# Patient Record
Sex: Female | Born: 1945 | Race: White | Hispanic: No | Marital: Married | Smoking: Never smoker
Health system: Southern US, Community
[De-identification: ages and names within clinical notes are randomized; demographics above are authoritative.]

## PROBLEM LIST (undated history)

## (undated) ENCOUNTER — Inpatient Hospital Stay: Admission: EM | Payer: Self-pay | Source: Home / Self Care

## (undated) DIAGNOSIS — J45909 Unspecified asthma, uncomplicated: Secondary | ICD-10-CM

## (undated) DIAGNOSIS — E785 Hyperlipidemia, unspecified: Secondary | ICD-10-CM

## (undated) DIAGNOSIS — K219 Gastro-esophageal reflux disease without esophagitis: Secondary | ICD-10-CM

## (undated) DIAGNOSIS — I10 Essential (primary) hypertension: Secondary | ICD-10-CM

## (undated) DIAGNOSIS — E079 Disorder of thyroid, unspecified: Secondary | ICD-10-CM

## (undated) DIAGNOSIS — H269 Unspecified cataract: Secondary | ICD-10-CM

## (undated) HISTORY — DX: Gastro-esophageal reflux disease without esophagitis: K21.9

## (undated) HISTORY — DX: Unspecified asthma, uncomplicated: J45.909

## (undated) HISTORY — DX: Unspecified cataract: H26.9

## (undated) HISTORY — PX: CORONARY ANGIOPLASTY WITH STENT PLACEMENT: SHX49

## (undated) HISTORY — DX: Essential (primary) hypertension: I10

## (undated) HISTORY — DX: Hyperlipidemia, unspecified: E78.5

## (undated) HISTORY — DX: Disorder of thyroid, unspecified: E07.9

## (undated) HISTORY — PX: OTHER SURGICAL HISTORY: SHX169

## (undated) HISTORY — PX: EYE SURGERY: SHX253

---
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
## 2005-03-29 ENCOUNTER — Emergency Department (HOSPITAL_COMMUNITY): Admission: EM | Admit: 2005-03-29 | Discharge: 2005-03-29 | Payer: Self-pay | Admitting: Emergency Medicine

## 2005-04-16 ENCOUNTER — Encounter: Admission: RE | Admit: 2005-04-16 | Discharge: 2005-04-16 | Payer: Self-pay | Admitting: Family Medicine

## 2006-07-01 ENCOUNTER — Encounter: Admission: RE | Admit: 2006-07-01 | Discharge: 2006-07-01 | Payer: Self-pay | Admitting: Family Medicine

## 2007-08-16 ENCOUNTER — Encounter: Admission: RE | Admit: 2007-08-16 | Discharge: 2007-08-16 | Payer: Self-pay | Admitting: Family Medicine

## 2008-08-16 ENCOUNTER — Encounter: Admission: RE | Admit: 2008-08-16 | Discharge: 2008-08-16 | Payer: Self-pay | Admitting: Family Medicine

## 2009-12-28 ENCOUNTER — Encounter: Admission: RE | Admit: 2009-12-28 | Discharge: 2009-12-28 | Payer: Self-pay | Admitting: Internal Medicine

## 2010-01-04 ENCOUNTER — Encounter: Admission: RE | Admit: 2010-01-04 | Discharge: 2010-01-04 | Payer: Self-pay | Admitting: Family Medicine

## 2010-11-24 HISTORY — PX: ORTHOPEDIC SURGERY: SHX850

## 2010-12-15 ENCOUNTER — Encounter: Payer: Self-pay | Admitting: Family Medicine

## 2011-06-20 ENCOUNTER — Ambulatory Visit (HOSPITAL_COMMUNITY)
Admission: RE | Admit: 2011-06-20 | Discharge: 2011-06-20 | Disposition: A | Discharge status: Home / Self Care | Payer: 59 | Source: Ambulatory Visit | Attending: Orthopedic Surgery | Admitting: Orthopedic Surgery

## 2011-06-20 ENCOUNTER — Ambulatory Visit (HOSPITAL_COMMUNITY): Payer: 59

## 2011-06-20 DIAGNOSIS — M6530 Trigger finger, unspecified finger: Secondary | ICD-10-CM | POA: Insufficient documentation

## 2011-06-20 DIAGNOSIS — M6740 Ganglion, unspecified site: Secondary | ICD-10-CM | POA: Insufficient documentation

## 2011-06-20 DIAGNOSIS — Z7982 Long term (current) use of aspirin: Secondary | ICD-10-CM | POA: Insufficient documentation

## 2011-06-20 DIAGNOSIS — Z01812 Encounter for preprocedural laboratory examination: Secondary | ICD-10-CM | POA: Insufficient documentation

## 2011-06-20 DIAGNOSIS — Z01818 Encounter for other preprocedural examination: Secondary | ICD-10-CM | POA: Insufficient documentation

## 2011-06-20 DIAGNOSIS — Z79899 Other long term (current) drug therapy: Secondary | ICD-10-CM | POA: Insufficient documentation

## 2011-06-20 LAB — CBC
MCHC: 33.5 g/dL (ref 30.0–36.0)
Platelets: 243 10*3/uL (ref 150–400)
RDW: 13.6 % (ref 11.5–15.5)
WBC: 4.8 10*3/uL (ref 4.0–10.5)

## 2011-06-20 LAB — COMPREHENSIVE METABOLIC PANEL
Albumin: 4 g/dL (ref 3.5–5.2)
BUN: 19 mg/dL (ref 6–23)
Calcium: 10.1 mg/dL (ref 8.4–10.5)
Creatinine, Ser: 0.79 mg/dL (ref 0.50–1.10)
Total Bilirubin: 0.5 mg/dL (ref 0.3–1.2)
Total Protein: 6.4 g/dL (ref 6.0–8.3)

## 2011-06-20 LAB — PROTIME-INR
INR: 1.02 (ref 0.00–1.49)
Prothrombin Time: 13.6 seconds (ref 11.6–15.2)

## 2011-06-25 NOTE — Op Note (Signed)
NAME:  Doris Wilkerson, Doris Wilkerson                   ACCOUNT NO.:  [id number]  MEDICAL RECORD NO.:  [id number]  LOCATION:  [facility]                         FACILITY:  [facility]  PHYSICIAN:  Nadara Mustard, MD     DATE OF BIRTH:  [birth year]  DATE OF PROCEDURE:  06/20/2011 DATE OF DISCHARGE:                              OPERATIVE REPORT   PREOPERATIVE DIAGNOSES: 1. Right long finger trigger finger at the A1 pulley. 2. Ganglion cyst, dorsum right ankle.  POSTOPERATIVE DIAGNOSES: 1. Right long finger trigger finger at the A1 pulley. 2. Ganglion cyst, dorsum right ankle.  PROCEDURES: 1. Release of A1 pulley, right long finger. 2. Excision of ganglion cyst, dorsum right ankle. 3. Release of the superficial peroneal nerve, right ankle.  SURGEON:  Nadara Mustard, MD  ANESTHESIA:  General.  ESTIMATED BLOOD LOSS:  Minimal.  ANTIBIOTICS:  Kefzol 1 g.  DRAINS:  None.  COMPLICATIONS:  None.  DISPOSITION:  To PACU in stable condition.  INDICATIONS FOR THE PROCEDURE:  The patient is a 65 year old woman with cardiac stents, who is cleared for surgery, who presents with chronic triggering of the right long finger as well as a painful recurrent ganglion cyst over the dorsum of the ankle due to failure of conservative care.  The patient presents at this time for surgical intervention.  Risks and benefits were discussed including infection, neurovascular injury, persistent pain, recurrence of the cyst, recurrence of the triggering, need for additional surgery.  The patient states she understands and wished to proceed at this time.  DESCRIPTION OF PROCEDURE:  The patient was brought to OR room 4 and underwent general anesthetic.  After adequate level of anesthesia was obtained, the patient's right lower extremity and right upper extremity were prepped using DuraPrep and draped into a sterile field.  Attention was first focused on the ankle.  An incision was made longitudinally over the cyst.  Blunt  dissection was carried down.  There was a swollen superficial peroneal nerve which was adhered to the cyst.  The peroneal nerve was released from the cyst and freed up from the fibrous scar tissue.  The nerve was enlarged approximately double the diameter of a normal nerve.  This was bluntly retracted from the surgical field.  The cyst was then excised from its base.  There was clear cystic fluid in the cyst consistent with a typical ganglion cyst.  This was excised in one block of tissue.  The wound was irrigated with normal saline.  The skin was closed using 2-0 nylon with modified vertical mattress suture. The wound was covered with Adaptic orthopedic sponges, Kerlix, and Coban.  Attention was then focused on the right hand.  In the distal palmar crease in line with the long finger, an incision was made and the crease.  Blunt dissection was carried down.  There was a cystic swelling beneath the A1 pulley.  The A1 pulley was released and this freed up the flexor tendon to the right long finger.  The wound was irrigated with normal saline.  The skin was closed using 2-0 nylon.  The wound was covered with Adaptic orthopedic sponges, Kerlix, and Coban.  The patient  was extubated and taken to PACU in stable condition.  Prescription for Vicodin for pain, discontinue the dressings in 3 days.  Follow up in the office in 1 week.     Nadara Mustard, MD     MVD/MEDQ  D:  06/20/2011  T:  06/20/2011  Job:  [id number]  Electronically Signed by Aldean Baker MD on 06/25/2011 11:91:47 AM

## 2011-11-28 DIAGNOSIS — I10 Essential (primary) hypertension: Secondary | ICD-10-CM | POA: Diagnosis not present

## 2011-11-28 DIAGNOSIS — I2510 Atherosclerotic heart disease of native coronary artery without angina pectoris: Secondary | ICD-10-CM | POA: Diagnosis not present

## 2011-11-28 DIAGNOSIS — Z9861 Coronary angioplasty status: Secondary | ICD-10-CM | POA: Diagnosis not present

## 2011-12-01 ENCOUNTER — Encounter (INDEPENDENT_AMBULATORY_CARE_PROVIDER_SITE_OTHER): Payer: Medicare Other | Admitting: Family Medicine

## 2011-12-01 DIAGNOSIS — K2900 Acute gastritis without bleeding: Secondary | ICD-10-CM | POA: Diagnosis not present

## 2011-12-01 DIAGNOSIS — H9209 Otalgia, unspecified ear: Secondary | ICD-10-CM | POA: Diagnosis not present

## 2011-12-01 DIAGNOSIS — Z124 Encounter for screening for malignant neoplasm of cervix: Secondary | ICD-10-CM | POA: Diagnosis not present

## 2011-12-01 DIAGNOSIS — Z0000 Encounter for general adult medical examination without abnormal findings: Secondary | ICD-10-CM | POA: Diagnosis not present

## 2011-12-01 DIAGNOSIS — I10 Essential (primary) hypertension: Secondary | ICD-10-CM | POA: Diagnosis not present

## 2011-12-01 DIAGNOSIS — Z23 Encounter for immunization: Secondary | ICD-10-CM | POA: Diagnosis not present

## 2011-12-01 DIAGNOSIS — I2510 Atherosclerotic heart disease of native coronary artery without angina pectoris: Secondary | ICD-10-CM | POA: Diagnosis not present

## 2011-12-09 ENCOUNTER — Encounter: Payer: Self-pay | Admitting: Family Medicine

## 2011-12-09 DIAGNOSIS — J45909 Unspecified asthma, uncomplicated: Secondary | ICD-10-CM | POA: Insufficient documentation

## 2011-12-09 DIAGNOSIS — I2510 Atherosclerotic heart disease of native coronary artery without angina pectoris: Secondary | ICD-10-CM

## 2011-12-09 DIAGNOSIS — E785 Hyperlipidemia, unspecified: Secondary | ICD-10-CM | POA: Insufficient documentation

## 2011-12-09 DIAGNOSIS — M8580 Other specified disorders of bone density and structure, unspecified site: Secondary | ICD-10-CM | POA: Insufficient documentation

## 2011-12-09 DIAGNOSIS — E039 Hypothyroidism, unspecified: Secondary | ICD-10-CM | POA: Insufficient documentation

## 2011-12-10 ENCOUNTER — Encounter: Payer: Self-pay | Admitting: Family Medicine

## 2012-01-31 ENCOUNTER — Telehealth: Payer: Self-pay | Admitting: Internal Medicine

## 2012-01-31 MED ORDER — LEVOTHYROXINE SODIUM 25 MCG PO TABS: 25.0000 ug | ORAL_TABLET | Freq: Every day | ORAL | Status: DC

## 2012-01-31 MED ORDER — ATORVASTATIN CALCIUM 40 MG PO TABS: 40.0000 mg | ORAL_TABLET | Freq: Every day | ORAL | Status: DC

## 2012-01-31 MED ORDER — BENAZEPRIL HCL 10 MG PO TABS: 10.0000 mg | ORAL_TABLET | Freq: Every day | ORAL | Status: DC

## 2012-01-31 MED ORDER — METOPROLOL TARTRATE 25 MG PO TABS: 25.0000 mg | ORAL_TABLET | Freq: Every day | ORAL | Status: DC

## 2012-01-31 NOTE — Telephone Encounter (Signed)
[facility]  Atorvastatin, benazepril, thyroid med, and metoprolol

## 2012-04-05 ENCOUNTER — Encounter: Payer: Self-pay | Admitting: Family Medicine

## 2012-04-05 ENCOUNTER — Ambulatory Visit (INDEPENDENT_AMBULATORY_CARE_PROVIDER_SITE_OTHER): Payer: Medicare Other | Admitting: Family Medicine

## 2012-04-05 VITALS — BP 119/75 | HR 67 | Temp 98.6°F | Resp 16 | Ht 66.75 in | Wt 161.4 lb

## 2012-04-05 DIAGNOSIS — H9209 Otalgia, unspecified ear: Secondary | ICD-10-CM | POA: Diagnosis not present

## 2012-04-05 DIAGNOSIS — K2900 Acute gastritis without bleeding: Secondary | ICD-10-CM

## 2012-04-05 DIAGNOSIS — E039 Hypothyroidism, unspecified: Secondary | ICD-10-CM

## 2012-04-05 DIAGNOSIS — I2510 Atherosclerotic heart disease of native coronary artery without angina pectoris: Secondary | ICD-10-CM | POA: Diagnosis not present

## 2012-04-05 DIAGNOSIS — E785 Hyperlipidemia, unspecified: Secondary | ICD-10-CM

## 2012-04-05 DIAGNOSIS — Z124 Encounter for screening for malignant neoplasm of cervix: Secondary | ICD-10-CM | POA: Diagnosis not present

## 2012-04-05 DIAGNOSIS — K2970 Gastritis, unspecified, without bleeding: Secondary | ICD-10-CM

## 2012-04-05 DIAGNOSIS — Z0000 Encounter for general adult medical examination without abnormal findings: Secondary | ICD-10-CM | POA: Diagnosis not present

## 2012-04-05 DIAGNOSIS — H6981 Other specified disorders of Eustachian tube, right ear: Secondary | ICD-10-CM

## 2012-04-05 DIAGNOSIS — R635 Abnormal weight gain: Secondary | ICD-10-CM

## 2012-04-05 DIAGNOSIS — Z23 Encounter for immunization: Secondary | ICD-10-CM | POA: Diagnosis not present

## 2012-04-05 MED ORDER — FLUTICASONE PROPIONATE 50 MCG/ACT NA SUSP: 2.0000 | Freq: Every day | NASAL | Status: DC

## 2012-04-05 MED ORDER — SUCRALFATE 1 G PO TABS: 1.0000 g | ORAL_TABLET | Freq: Two times a day (BID) | ORAL | Status: AC

## 2012-04-05 MED ORDER — SUCRALFATE 1 G PO TABS: 1.0000 g | ORAL_TABLET | Freq: Two times a day (BID) | ORAL | Status: DC

## 2012-04-05 NOTE — Progress Notes (Signed)
  Subjective:    Patient ID: Doris Wilkerson, female    DOB: 10-08-1946, 66 y.o.   MRN: [id number]  HPI  Patient presents with several concerns  (R) otalgia- Last week very congested. Denies ocular discharge. Denies change in hearing or tinnitus.  GERD- " I have acid stomach" and using excessive Tums; 2 cups of coffee and 4 cups of tea through the day.  Weight concerns- states challenged with making proper food choices and frequent snacks; joined [employer]                                and has been working time several times a week.  CAD- complete evaluation per Dr. Jacinto Halim; stress test negative  Review of Systems    Leaving for [place], [place]; and [place] as husband is recording his music Objective:   Physical Exam  HENT:  Left Ear: External ear normal.       Inflamed nares; clear PND (R) TM with normal light reflex and landmarks  Neck: Neck supple. No thyromegaly present.  Cardiovascular: Normal rate, regular rhythm and normal heart sounds.   Pulmonary/Chest: Effort normal and breath sounds normal.  Abdominal: Soft. Bowel sounds are normal. There is no tenderness.  Lymphadenopathy:    She has no cervical adenopathy.  Neurological: She is alert.  Skin: Skin is warm.          Assessment & Plan:   1. Gastritis  sucralfate (CARAFATE) 1 G tablet, modify food choices; eliminate caffiene  2. ETD (eustachian tube dysfunction), right  fluticasone (FLONASE) 50 MCG/ACT nasal spray  3. Weight gain  Referral to Nutrition and Diabetes Services  4. CAD (coronary artery disease)    5. Dyslipidemia    6. Hypothyroid      Continue current medications Nutrition referral Reduce ASA 81 mg day Carafate 1 gm BID X 10 days

## 2012-05-04 ENCOUNTER — Encounter: Payer: Self-pay | Admitting: *Deleted

## 2012-05-04 ENCOUNTER — Encounter: Payer: Medicare Other | Attending: Family Medicine | Admitting: *Deleted

## 2012-05-04 DIAGNOSIS — R635 Abnormal weight gain: Secondary | ICD-10-CM | POA: Insufficient documentation

## 2012-05-04 DIAGNOSIS — Z713 Dietary counseling and surveillance: Secondary | ICD-10-CM | POA: Diagnosis not present

## 2012-05-04 NOTE — Patient Instructions (Signed)
Goals:  Eat 3 meals/day, Avoid meal skipping   Increase protein rich foods  Follow "Plate Method" for portion control  Limit carbohydrate 1-2 servings/meal   Choose more whole grains, lean protein, low-fat dairy, and fruits/non-starchy vegetables.   Choose more low-fat methods of meat preparation like grilling  Aim for >30 min of physical activity daily: walk 15 min 2-3 times a day and look into water aerobics. Try a personal trainer  Limit concentrated sweets  Split entrees in half when eating out  Calorie Brooke Dare book

## 2012-05-04 NOTE — Progress Notes (Signed)
  Medical Nutrition Therapy:  Appt start time: 1645 end time:  1745.   Assessment:  Primary concerns today: involuntary weight gain.   MEDICATIONS: see list   DIETARY INTAKE:  Usual eating pattern includes 3-77meals and 2-3 snacks per day.  Everyday foods include large portions and eats out.  Avoided foods include none.    24-hr recall:  B ( AM): mixed cereal: oatmeal, seeds, nuts, yogurt and skim milk.  Coffee with unsweetened cocoa  Snk ( AM): 1 scrambled egg or fried maybe applesauce or banana with water Late lunch ( PM): may have soup, meat and cheese sandwich, maybe leftovers, lettuce tomato, slaw salad with mayo and raisins. May have oatmeal cookie; may go out to [employer] 2-3 days a week. water Snk ( PM): grazes on chocolate or fruit or applesauce, walnuts D ( PM): baked fish or pan-fried chicken or pork (larger portions) seasoned with butter and wine with vegetables and may have starch, but not typically; goes out at least 2 nights a week (typically pasta or greek) Snk ( PM): peanut butter on toast with skim milk, may have prune juice Beverages: water, skim milk, coffee  Usual physical activity: used to walk 3 miles every day. Now has gym membership, but not in a routine. 1 hour gardening daily  Estimated energy needs: 1400 calories 158 g carbohydrates 105 g protein 39 g fat  Progress Towards Goal(s):  In progress.   Nutritional Diagnosis:  NI-1.5 Excessive energy intake As related to large portions and physical inactivity.  As evidenced by 10 pound incoluntary weight gain.    Intervention:  Nutrition counseling provided.  Patient here today has gained 10 pounds over past 2 years and is quite unhappy.  Eats out a fair amount and eats large portions.  Currently fairly inactive.  Does gardening daily, but nothing vigorous.  Discussed portion control.  Discussed decreased metabolism with aging correlates to decreased energy needs.  Also encouraged more physical activity  and using less fats in meal preparation.  Client suffers from GERD and asked for information on GERD meal plans  Handouts given during visit include:  Eating out  GERD meal plan  Goals:  Eat 3 meals/day, Avoid meal skipping   Increase protein rich foods  Follow "Plate Method" for portion control  Limit carbohydrate 1-2 servings/meal   Choose more whole grains, lean protein, low-fat dairy, and fruits/non-starchy vegetables.   Choose more low-fat methods of meat preparation like grilling  Aim for >30 min of physical activity daily: walk 15 min 2-3 times a day and look into water aerobics. Try a personal trainer  Limit concentrated sweets  Split entrees in half when eating out  Calorie Brooke Dare book  Monitoring/Evaluation:  Dietary intake, exercise, and body weight prn.

## 2012-05-05 DIAGNOSIS — H53419 Scotoma involving central area, unspecified eye: Secondary | ICD-10-CM | POA: Diagnosis not present

## 2012-06-16 DIAGNOSIS — H53419 Scotoma involving central area, unspecified eye: Secondary | ICD-10-CM | POA: Diagnosis not present

## 2012-07-07 ENCOUNTER — Encounter: Payer: Self-pay | Admitting: Family Medicine

## 2012-07-08 ENCOUNTER — Encounter: Payer: Self-pay | Admitting: Family Medicine

## 2012-07-08 ENCOUNTER — Ambulatory Visit (INDEPENDENT_AMBULATORY_CARE_PROVIDER_SITE_OTHER): Payer: Medicare Other | Admitting: Family Medicine

## 2012-07-08 VITALS — BP 127/74 | HR 53 | Temp 97.9°F | Resp 16 | Ht 63.0 in | Wt 152.0 lb

## 2012-07-08 DIAGNOSIS — J301 Allergic rhinitis due to pollen: Secondary | ICD-10-CM | POA: Diagnosis not present

## 2012-07-08 DIAGNOSIS — M6740 Ganglion, unspecified site: Secondary | ICD-10-CM | POA: Diagnosis not present

## 2012-07-08 DIAGNOSIS — M6530 Trigger finger, unspecified finger: Secondary | ICD-10-CM

## 2012-07-08 DIAGNOSIS — M65331 Trigger finger, right middle finger: Secondary | ICD-10-CM

## 2012-07-08 DIAGNOSIS — J309 Allergic rhinitis, unspecified: Secondary | ICD-10-CM

## 2012-07-08 MED ORDER — FLUTICASONE PROPIONATE 50 MCG/ACT NA SUSP: 2.0000 | Freq: Every day | NASAL | Status: DC

## 2012-07-08 NOTE — Progress Notes (Signed)
66 yo woman who has successfully lost 7 lbs.  She credits her nutrition consult for this success..  -Has been trying to eliminate milk and replace with soy products  -She notes 3 years of fullness on the right side of sinuses and face.  She has seen ENT without definite diagnosis.  She takes the [facility] on the right hand side, and wants reassurance that this is not habituating.  -right anterolateral ganglion cyst of ankle is coming back after Dr. Lajoyce Corners operated on it last year  -right middle finger trigger finger persists.  Objective:  NAD  We discussed the above.  Patient does not want to proceed with further intervention on the latter two above.  1x1 cm cyst. Right ankle, nontender   Right middle finger moves adequately but occasionally  Assessment:  Stable, no need to act on above problems at present.

## 2012-07-08 NOTE — Patient Instructions (Signed)
Lyndi, Holbein, RD 05/04/2012 5:54 PM Signed  Medical Nutrition Therapy: Appt start time: 1645 end time: 1745.  Assessment: Primary concerns today: involuntary weight gain.  MEDICATIONS: see list  DIETARY INTAKE:  Usual eating pattern includes 3-53meals and 2-3 snacks per day.  Everyday foods include large portions and eats out. Avoided foods include none.  24-hr recall:  B ( AM): mixed cereal: oatmeal, seeds, nuts, yogurt and skim milk. Coffee with unsweetened cocoa  Snk ( AM): 1 scrambled egg or fried maybe applesauce or banana with water  Late lunch ( PM): may have soup, meat and cheese sandwich, maybe leftovers, lettuce tomato, slaw salad with mayo and raisins. May have oatmeal cookie; may go out to [employer] 2-3 days a week. water  Snk ( PM): grazes on chocolate or fruit or applesauce, walnuts  D ( PM): baked fish or pan-fried chicken or pork (larger portions) seasoned with butter and wine with vegetables and may have starch, but not typically; goes out at least 2 nights a week (typically pasta or greek)  Snk ( PM): peanut butter on toast with skim milk, may have prune juice  Beverages: water, skim milk, coffee  Usual physical activity: used to walk 3 miles every day. Now has gym membership, but not in a routine. 1 hour gardening daily  Estimated energy needs:  1400 calories  158 g carbohydrates  105 g protein  39 g fat  Progress Towards Goal(s): In progress.  Nutritional Diagnosis:  NI-1.5 Excessive energy intake As related to large portions and physical inactivity. As evidenced by 10 pound incoluntary weight gain.  Intervention: Nutrition counseling provided. Patient here today has gained 10 pounds over past 2 years and is quite unhappy. Eats out a fair amount and eats large portions. Currently fairly inactive. Does gardening daily, but nothing vigorous. Discussed portion control. Discussed decreased metabolism with aging correlates to decreased energy needs. Also encouraged  more physical activity and using less fats in meal preparation. Client suffers from GERD and asked for information on GERD meal plans  Handouts given during visit include:  Eating out  GERD meal plan Goals:  Eat 3 meals/day, Avoid meal skipping  Increase protein rich foods  Follow "Plate Method" for portion control  Limit carbohydrate 1-2 servings/meal  Choose more whole grains, lean protein, low-fat dairy, and fruits/non-starchy vegetables.  Choose more low-fat methods of meat preparation like grilling  Aim for >30 min of physical activity daily: walk 15 min 2-3 times a day and look into water aerobics. Try a personal trainer  Limit concentrated sweets  Split entrees in half when eating out  Calorie Brooke Dare book Monitoring/Evaluation: Dietary intake, exercise, and body weight prn.

## 2012-10-27 DIAGNOSIS — Z9861 Coronary angioplasty status: Secondary | ICD-10-CM | POA: Diagnosis not present

## 2012-10-27 DIAGNOSIS — E7800 Pure hypercholesterolemia, unspecified: Secondary | ICD-10-CM | POA: Diagnosis not present

## 2012-10-27 DIAGNOSIS — I10 Essential (primary) hypertension: Secondary | ICD-10-CM | POA: Diagnosis not present

## 2012-11-19 ENCOUNTER — Ambulatory Visit: Payer: Medicare Other

## 2012-11-19 ENCOUNTER — Ambulatory Visit (INDEPENDENT_AMBULATORY_CARE_PROVIDER_SITE_OTHER): Payer: Medicare Other | Admitting: Family Medicine

## 2012-11-19 VITALS — BP 111/71 | HR 83 | Temp 98.0°F | Resp 16 | Ht 63.0 in | Wt 153.0 lb

## 2012-11-19 DIAGNOSIS — S92353A Displaced fracture of fifth metatarsal bone, unspecified foot, initial encounter for closed fracture: Secondary | ICD-10-CM

## 2012-11-19 DIAGNOSIS — R05 Cough: Secondary | ICD-10-CM

## 2012-11-19 DIAGNOSIS — R509 Fever, unspecified: Secondary | ICD-10-CM

## 2012-11-19 DIAGNOSIS — S92309A Fracture of unspecified metatarsal bone(s), unspecified foot, initial encounter for closed fracture: Secondary | ICD-10-CM

## 2012-11-19 LAB — POCT INFLUENZA A/B
Influenza A, POC: NEGATIVE
Influenza B, POC: NEGATIVE

## 2012-11-19 MED ORDER — DOXYCYCLINE HYCLATE 100 MG PO TABS: 100.0000 mg | ORAL_TABLET | Freq: Two times a day (BID) | ORAL | Status: DC

## 2012-11-19 NOTE — Progress Notes (Signed)
  Subjective:    Patient ID: JULIAUNA STUEVE, female    DOB: 05-20-1946, 66 y.o.   MRN: [id number]  HPI KALEIAH KUTZER is a 66 y.o. female 2 weeks ago - in [place], [place] misstep, R foot twisted inward.  Swelling on top of foot, then all across top of foot next morning, and unable to WB. Seen by urgent care in [place]. Had xray, 5th metatarsal fracture.  Seen by ortho in [place].  Tried to put in specific shoe to take weight off of it, then just regular shoe for past 2 weeks.  Swelling during day, has been resting due to cold symptoms.  Tylenol at times, post op shoe, elevating as able. Some numbness into toes at times, with swelling, and noticed bruising.    Also complains of cough/cold symptoms - past 5 days. Thick clear mucus. Flu like symptoms., head congestion.  Taken 3 days of doxycycline - twice per day. (husbands from trip).    Did not get flu shot this year.  Review of Systems  Constitutional: Positive for fever and chills.  HENT: Positive for hearing loss.   Respiratory: Positive for cough. Negative for shortness of breath.   Musculoskeletal: Positive for joint swelling and arthralgias.  Skin: Positive for color change.       Objective:   Physical Exam  Constitutional: She is oriented to person, place, and time. She appears well-developed and well-nourished. No distress.  Pulmonary/Chest: Effort normal.  Musculoskeletal:       Feet:  Neurological: She is alert and oriented to person, place, and time.   Outside x ray reviewed on CD R foot dated 11/07/12.  R proximal 5th metatarsal fx identified - nondisplaced.  [facility] reading (PRIMARY) by  Dr. Neva Seat:  R foot: 5th metatarsal styloid fracture - stable, no apparent displacement, and early callous noted.  Results for orders placed in visit on 11/19/12  POCT INFLUENZA A/B      Component Value Range   Influenza A, POC Negative     Influenza B, POC Negative         Assessment & Plan:  ANAISA RADI is a 66 y.o. female 1. Fracture  of 5th metatarsal  DG Foot Complete Right  2. Cough  POCT Influenza A/B  3. Fever  POCT Influenza A/B   5th metatarsal fracture - appears to be styloid fx, and stable.  Now 2 weeks into fracture, but only 1 day into post op shoe.  Can continue postop shoe for 1 more week, then likely out to ROM, and HEP. XR to overread, tylenol otc prn.    Cough, with initial URI, then improvement on antibiotics. Will continue doxy for 10 day course as unknown of this was reason for improvement. rtc precautions, sx care.  Patient Instructions  7 days of doxycycline were sent to your pharmacy. mucinex of needed for cough. Recheck foot in 1 week. Return to the clinic or go to the nearest emergency room if any of your symptoms worsen or new symptoms occur.

## 2012-11-19 NOTE — Patient Instructions (Addendum)
7 days of doxycycline were sent to your pharmacy. mucinex of needed for cough. Recheck foot in 1 week. Return to the clinic or go to the nearest emergency room if any of your symptoms worsen or new symptoms occur.

## 2012-12-13 ENCOUNTER — Ambulatory Visit (INDEPENDENT_AMBULATORY_CARE_PROVIDER_SITE_OTHER): Payer: Medicare Other | Admitting: Physician Assistant

## 2012-12-13 DIAGNOSIS — Z23 Encounter for immunization: Secondary | ICD-10-CM

## 2012-12-13 NOTE — Progress Notes (Unsigned)
  Subjective:    Patient ID: Doris Wilkerson, female    DOB: 1946/03/29, 67 y.o.   MRN: [id number]  HPIFlu shot only    Review of Systems     Objective:   Physical Exam        Assessment & Plan:

## 2012-12-27 ENCOUNTER — Ambulatory Visit (INDEPENDENT_AMBULATORY_CARE_PROVIDER_SITE_OTHER): Payer: Medicare Other | Admitting: Family Medicine

## 2012-12-27 ENCOUNTER — Encounter: Payer: Self-pay | Admitting: Family Medicine

## 2012-12-27 ENCOUNTER — Ambulatory Visit: Payer: Medicare Other

## 2012-12-27 VITALS — BP 128/68 | HR 74 | Temp 97.6°F | Resp 16 | Ht 66.0 in | Wt 153.8 lb

## 2012-12-27 DIAGNOSIS — S92353A Displaced fracture of fifth metatarsal bone, unspecified foot, initial encounter for closed fracture: Secondary | ICD-10-CM

## 2012-12-27 DIAGNOSIS — M79609 Pain in unspecified limb: Secondary | ICD-10-CM | POA: Diagnosis not present

## 2012-12-27 NOTE — Progress Notes (Signed)
  Subjective:    Patient ID: Doris Wilkerson, female    DOB: June 14, 1946, 67 y.o.   MRN: [id number]  HPI See 11/19/12 office visit.  seen then for Right 5th metatarsal styloid fracture - 52 weeks old (happened in [place], initially treated with ?orhtotic shoe, then regular shoe. 2 weeks into fracture, but only 1 day into post op shoe when seen last, but overall appeared stable. Plan to conitnue postop shoe for 1 more week, then likely out to ROM, and HEP.   11/19/12 XR report: Findings: Nondisplaced fracture involving the base of the fifth  metatarsal. No additional fracture is seen. The joint spaces are preserved. The visualized soft tissues are unremarkable. IMPRESSION: Nondisplaced fracture involving the base of the fifth metatarsal  Had been feeling great, increased activity after postop shoe. Noticed some soreness after resuming driving. Sore to press on brake at times, then noted occasional soreness with walking. No swelling, no bruising. Tx: no meds needed.     Review of Systems As above.      Objective:   Physical Exam  Vitals reviewed. Constitutional: She is oriented to person, place, and time. She appears well-developed and well-nourished. No distress.  Musculoskeletal:       Feet:  Neurological: She is alert and oriented to person, place, and time. She has normal strength. No sensory deficit.  Skin: Skin is warm and dry. No bruising, no ecchymosis and no rash noted. No erythema.  Psychiatric: She has a normal mood and affect. Her behavior is normal.   [facility] reading (PRIMARY) by  Dr. Neva Seat: R foot:. Healed 5th mt styloid fx with visible callous.       Assessment & Plan:  Doris Wilkerson is a 67 y.o. female 1. Fracture of 5th metatarsal  DG Foot Complete Right, DG Foot Complete Right   radiographically appears healed. Minimal discomfort over fx site.  Continue rom and HEP with theraband.  Recheck in the next 3 weeks if still discomfort.  xr to overread.   Patient Instructions    Continue to work on range of motion and gentle stregthening with the theraband provided. If still sore in next 2-3 weeks - recheck for possible repeat xray.  Return to the clinic or go to the nearest emergency room if any of your symptoms worsen or new symptoms occur.

## 2012-12-27 NOTE — Patient Instructions (Signed)
Continue to work on range of motion and gentle stregthening with the theraband provided. If still sore in next 2-3 weeks - recheck for possible repeat xray.  Return to the clinic or go to the nearest emergency room if any of your symptoms worsen or new symptoms occur.

## 2013-01-17 ENCOUNTER — Ambulatory Visit: Payer: Medicare Other

## 2013-01-17 ENCOUNTER — Encounter: Payer: Self-pay | Admitting: Family Medicine

## 2013-01-17 ENCOUNTER — Ambulatory Visit (INDEPENDENT_AMBULATORY_CARE_PROVIDER_SITE_OTHER): Payer: Medicare Other | Admitting: Family Medicine

## 2013-01-17 VITALS — BP 100/60 | HR 72 | Temp 98.0°F | Resp 16 | Ht 66.0 in | Wt 153.8 lb

## 2013-01-17 DIAGNOSIS — M84474P Pathological fracture, right foot, subsequent encounter for fracture with malunion: Secondary | ICD-10-CM

## 2013-01-17 DIAGNOSIS — I2510 Atherosclerotic heart disease of native coronary artery without angina pectoris: Secondary | ICD-10-CM

## 2013-01-17 DIAGNOSIS — E7800 Pure hypercholesterolemia, unspecified: Secondary | ICD-10-CM

## 2013-01-17 DIAGNOSIS — I10 Essential (primary) hypertension: Secondary | ICD-10-CM | POA: Diagnosis not present

## 2013-01-17 DIAGNOSIS — IMO0002 Reserved for concepts with insufficient information to code with codable children: Secondary | ICD-10-CM | POA: Diagnosis not present

## 2013-01-17 DIAGNOSIS — E039 Hypothyroidism, unspecified: Secondary | ICD-10-CM | POA: Diagnosis not present

## 2013-01-17 MED ORDER — BENAZEPRIL HCL 10 MG PO TABS: 10.0000 mg | ORAL_TABLET | Freq: Every day | ORAL | Status: DC

## 2013-01-17 MED ORDER — ATORVASTATIN CALCIUM 40 MG PO TABS: 40.0000 mg | ORAL_TABLET | Freq: Every day | ORAL | Status: DC

## 2013-01-17 MED ORDER — LEVOTHYROXINE SODIUM 25 MCG PO TABS: 25.0000 ug | ORAL_TABLET | Freq: Every day | ORAL | Status: DC

## 2013-01-17 NOTE — Progress Notes (Signed)
Subjective:    Patient ID: Doris Wilkerson, female    DOB: 1946-09-16, 67 y.o.   MRN: [id number]  HPI Doris Wilkerson is a 67 y.o. female Here for follow up R 5th metatarsal.  See 11/19/12 office visit.  seen then for Right 5th metatarsal styloid fracture - 45 weeks old (happened in [place], initially treated with ?orhtotic shoe, then regular shoe. 2 weeks into fracture, but only 1 day into post op shoe when seen last, but overall appeared stable. Plan to conitnue postop shoe for 1 more week, then likely out to ROM, and HEP.   11/19/12 XR report: Findings: Nondisplaced fracture involving the base of the fifth  metatarsal. No additional fracture is seen. The joint spaces are preserved. The visualized soft tissues are unremarkable. IMPRESSION: Nondisplaced fracture involving the base of the fifth metatarsal  12/27/12 office visit: Had been feeling great, increased activity after postop shoe. Noticed some soreness after resuming driving. Sore to press on brake at times, then noted occasional soreness with walking. No swelling, no bruising. Tx: no meds needed. Initially planned on rom and hep, but xray report suggested "interval healing of the Jones fracture at the base of the fifth metacarpal. On the lateral projection the fracture plane is still evident. IMPRESSION: Interval healing of the base of fifth metatarsal fracture.  Restarted post op shoe after this report - or cam walker most of the time - only using alleve few times per week for rare/occasional soreness.  Sore in heel at times with walker.   At end of visit - also notes needs med refills.  Prior followed by Dr. Hal Hope.  Followed by Dr Jacinto Halim - hx of CAD - LAD stent placed at 67 yo.  Last OV with [facility] in November.   hyperlipipidemia - takes 40mg  qd.  No new muscle aches.  Same dose for awhile. Not fasting today, no recent labs noted in chart.   HTN - no new side effects of meds, needs refill of benazepril, but also takes metoprolol 25mg  qd. See  above re: labs.   Hypothyroidism - each day. No recent labs.     Review of Systems  Constitutional: Negative for fatigue and unexpected weight change.  Respiratory: Negative for chest tightness and shortness of breath.   Cardiovascular: Negative for chest pain, palpitations and leg swelling.  Gastrointestinal: Negative for abdominal pain and blood in stool.  Endocrine: Negative for cold intolerance and heat intolerance.  Neurological: Negative for dizziness, syncope, light-headedness and headaches.       Objective:   Physical Exam  Vitals reviewed. Constitutional: She is oriented to person, place, and time. She appears well-developed and well-nourished.  HENT:  Head: Normocephalic and atraumatic.  Eyes: Conjunctivae and EOM are normal. Pupils are equal, round, and reactive to light.  Neck: Carotid bruit is not present.  Cardiovascular: Normal rate, regular rhythm, normal heart sounds and intact distal pulses.   Pulmonary/Chest: Effort normal and breath sounds normal.  Abdominal: Soft. She exhibits no pulsatile midline mass. There is no tenderness.  Musculoskeletal:       Right foot: She exhibits tenderness and bony tenderness (minimal proximal 5th mt ttp. ).  Neurological: She is alert and oriented to person, place, and time.  Skin: Skin is warm and dry.  Psychiatric: She has a normal mood and affect. Her behavior is normal.    [facility] reading (PRIMARY) by  Dr. Neva Seat:  R foot. Healing R 5th metatarsal base fracture.      Assessment &  Plan:  Doris Wilkerson is a 67 y.o. female Metatarsal fracture, pathologic, right, with malunion, subsequent encounter - Plan: DG Foot Complete Right.  appeared to be more of a styloid fx, not Jones.  ,Appears improved, with calllous, but will wait on overread.  Continue bracing with postop shoe or camwalker for next 2 weeks.   Essential hypertension, benign - Plan: benazepril (LOTENSIN) 10 MG tablet refilled. Continue metoprolol. Plan on lab  only viisit this week for lipids, cmp.   Unspecified hypothyroidism - Plan: TSH, levothyroxine (SYNTHROID, LEVOTHROID) 25 MCG tablet refilled.  Check tsh on upcoming lab visit.   Pure hypercholesterolemia - Plan: Lipid panel, atorvastatin (LIPITOR) 40 MG tablet refilled.   Coronary atherosclerosis of unspecified type of vessel, native or graft - Plan: Comprehensive metabolic panel.  Continue current meds, and follow up with cardiology.  Patient Instructions  Continue the post op shoe or cam walker for the next 2 weeks.  i will let you know about the xray overread once it is back from the radiologist.  i refilled your medications, but return here fasting in the next week for labwork only. Your should receive a call or letter about your lab results within the next week to 10 days.  Return to the clinic or go to the nearest emergency room if any of your symptoms worsen or new symptoms occur.

## 2013-01-17 NOTE — Patient Instructions (Signed)
Continue the post op shoe or cam walker for the next 2 weeks.  i will let you know about the xray overread once it is back from the radiologist.  i refilled your medications, but return here fasting in the next week for labwork only. Your should receive a call or letter about your lab results within the next week to 10 days.  Return to the clinic or go to the nearest emergency room if any of your symptoms worsen or new symptoms occur.

## 2013-01-18 ENCOUNTER — Other Ambulatory Visit (INDEPENDENT_AMBULATORY_CARE_PROVIDER_SITE_OTHER): Payer: Medicare Other | Admitting: *Deleted

## 2013-01-18 DIAGNOSIS — I2510 Atherosclerotic heart disease of native coronary artery without angina pectoris: Secondary | ICD-10-CM

## 2013-01-18 DIAGNOSIS — E7800 Pure hypercholesterolemia, unspecified: Secondary | ICD-10-CM

## 2013-01-18 DIAGNOSIS — E039 Hypothyroidism, unspecified: Secondary | ICD-10-CM

## 2013-01-18 LAB — COMPREHENSIVE METABOLIC PANEL
ALT: 14 U/L (ref 0–35)
Albumin: 4.6 g/dL (ref 3.5–5.2)
Alkaline Phosphatase: 69 U/L (ref 39–117)
CO2: 28 mEq/L (ref 19–32)
Potassium: 4.3 mEq/L (ref 3.5–5.3)
Sodium: 141 mEq/L (ref 135–145)
Total Bilirubin: 0.6 mg/dL (ref 0.3–1.2)
Total Protein: 7 g/dL (ref 6.0–8.3)

## 2013-01-18 LAB — LIPID PANEL
Cholesterol: 193 mg/dL (ref 0–200)
Total CHOL/HDL Ratio: 2.6 Ratio
VLDL: 12 mg/dL (ref 0–40)

## 2013-01-18 LAB — TSH: TSH: 2.856 u[IU]/mL (ref 0.350–4.500)

## 2013-01-18 NOTE — Progress Notes (Signed)
Pt here for labs only. 

## 2013-02-14 ENCOUNTER — Encounter: Payer: Self-pay | Admitting: Family Medicine

## 2013-02-14 ENCOUNTER — Ambulatory Visit (INDEPENDENT_AMBULATORY_CARE_PROVIDER_SITE_OTHER): Payer: Medicare Other | Admitting: Family Medicine

## 2013-02-14 ENCOUNTER — Ambulatory Visit: Payer: Medicare Other

## 2013-02-14 VITALS — BP 113/73 | HR 57 | Temp 96.7°F | Resp 16 | Ht 67.0 in | Wt 154.0 lb

## 2013-02-14 DIAGNOSIS — M25569 Pain in unspecified knee: Secondary | ICD-10-CM | POA: Diagnosis not present

## 2013-02-14 DIAGNOSIS — M25571 Pain in right ankle and joints of right foot: Secondary | ICD-10-CM

## 2013-02-14 DIAGNOSIS — S92301G Fracture of unspecified metatarsal bone(s), right foot, subsequent encounter for fracture with delayed healing: Secondary | ICD-10-CM

## 2013-02-14 DIAGNOSIS — M25579 Pain in unspecified ankle and joints of unspecified foot: Secondary | ICD-10-CM | POA: Insufficient documentation

## 2013-02-14 DIAGNOSIS — S92909A Unspecified fracture of unspecified foot, initial encounter for closed fracture: Secondary | ICD-10-CM

## 2013-02-14 NOTE — Progress Notes (Signed)
Subjective:    Patient ID: Doris Wilkerson, female    DOB: 05-27-1946, 67 y.o.   MRN: [id number]  HPI Doris Wilkerson is a 67 y.o. female  Here for follow up R 5th metatarsal fracture -.  See 11/19/12 office visit.  seen then for Right 5th metatarsal styloid fracture - 64 weeks old (happened in [place], initially treated with ?orhtotic shoe, then regular shoe. 2 weeks into fracture, but only 1 day into post op shoe when seen last, but overall appeared stable. Plan to conitnue postop shoe for 1 more week, then likely out to ROM, and HEP.   11/19/12 XR report: Findings: Nondisplaced fracture involving the base of the fifth  metatarsal. No additional fracture is seen. The joint spaces are preserved. The visualized soft tissues are unremarkable. IMPRESSION: Nondisplaced fracture involving the base of the fifth metatarsal  12/27/12 office visit: Had been feeling great, increased activity after postop shoe. Noticed some soreness after resuming driving. Sore to press on brake at times, then noted occasional soreness with walking. No swelling, no bruising. Tx: no meds needed. Initially planned on rom and hep, but xray report suggested "interval healing of the Jones fracture at the base of the fifth metacarpal. On the lateral projection the fracture plane is still evident. IMPRESSION: Interval healing of the base of fifth metatarsal fracture. Restarted post op shoe after this report - or cam walker most of the time - only using alleve few times per week for rare/occasional soreness.  Sore in heel at times with walker.  appeared to be more of a styloid fx, not Jones. and improved with callous  Continue bracing with postop shoe or camwalker for next 2 weeks.  XR report: Findings: There has been further healing of the fracture of the base of the fifth metatarsal. A portion of the fracture line is still visible. Periosteal new bone is noted on the lateral view. No other abnormality. IMPRESSION: Progressive healing of the  fracture of the base of the fifth metatarsal.  Today - states no foot pain in the morning, but sore in area in afternoon after being on it for awhile. Stopped wearing camwalker about 10 days ago.  Still sore in area. Sore in heel  And undersurface of foot with wearing cam walker.   Hx of CAD, s/p PTCA at 67yo - Dr. Jacinto Halim is cardiologist  (OV in November 2013), HTN, hyperlipedimia and hypothyroidism. meds refilled last ov.  Labs after last OV:  Results for orders placed in visit on 01/18/13  LIPID PANEL      Result Value Range   Cholesterol 193  0 - 200 mg/dL   Triglycerides 60  <130 mg/dL   HDL 73  >86 mg/dL   Total CHOL/HDL Ratio 2.6     VLDL 12  0 - 40 mg/dL   LDL Cholesterol 578 (*) 0 - 99 mg/dL  TSH      Result Value Range   TSH 2.856  0.350 - 4.500 uIU/mL  COMPREHENSIVE METABOLIC PANEL      Result Value Range   Sodium 141  135 - 145 mEq/L   Potassium 4.3  3.5 - 5.3 mEq/L   Chloride 106  96 - 112 mEq/L   CO2 28  19 - 32 mEq/L   Glucose, Bld 91  70 - 99 mg/dL   BUN 18  6 - 23 mg/dL   Creat 4.69  6.29 - 5.28 mg/dL   Total Bilirubin 0.6  0.3 - 1.2 mg/dL  Alkaline Phosphatase 69  39 - 117 U/L   AST 18  0 - 37 U/L   ALT 14  0 - 35 U/L   Total Protein 7.0  6.0 - 8.3 g/dL   Albumin 4.6  3.5 - 5.2 g/dL   Calcium 16.1  8.4 - 09.6 mg/dL   Has refills of meds  - see last ov.  [facility] has rx's.  Review of Systems As above.     Objective:   Physical Exam  Vitals reviewed. Constitutional: She is oriented to person, place, and time. She appears well-developed. No distress.  Musculoskeletal:       Right ankle: She exhibits normal range of motion and no swelling. Achilles tendon normal. Achilles tendon exhibits no pain and no defect.       Right foot: She exhibits bony tenderness. She exhibits normal range of motion.       Feet:  Neurological: She is alert and oriented to person, place, and time.  Skin: Skin is warm and dry. No rash noted. No erythema.  Psychiatric: She has a  normal mood and affect. Her behavior is normal.   NVi distally to toes.   [facility] reading (PRIMARY) by  Dr. Neva Seat: R foot - near complete healing of 5th mt fx styloid. Callous noted, with possible small cortex opening on lateral view only.       Assessment & Plan:  Doris Wilkerson is a 67 y.o. female Pain in joint, ankle and foot, right - Plan: AMB referral to orthopedics, DG Foot Complete Right  Fracture of 5th metatarsal, right, with delayed healing, subsequent encounter - Plan: AMB referral to orthopedics, DG Foot Complete Right  DOI approximately 11/05/13. Slow, but persistent healing in area. Did not appear to be jones fracture.  Will refer to ortho for eval for continued bracing or other recommended treatment given continued pain in area with activity, and discomfort in other areas of foot with cam walker use.   Patient Instructions  We will refer you to the foot specialist as still having pain in the area of the fracture. Would recommend returning to  atleast the post op shoe until that visit as still having pain in affected area, but range of motion during day is ok. Return to the clinic or go to the nearest emergency room if any of your symptoms worsen or new symptoms occur.

## 2013-02-14 NOTE — Patient Instructions (Addendum)
We will refer you to the foot specialist as still having pain in the area of the fracture. Would recommend returning to  atleast the post op shoe until that visit as still having pain in affected area, but range of motion during day is ok. Return to the clinic or go to the nearest emergency room if any of your symptoms worsen or new symptoms occur.

## 2013-03-03 ENCOUNTER — Telehealth: Payer: Self-pay

## 2013-03-03 NOTE — Telephone Encounter (Signed)
Pt has an appt with dr duda on Monday 03/07/13 at 945 and would like a copy of her right foot and or ankle xrays   Please call [phone] when ready to pick up

## 2013-03-07 DIAGNOSIS — S92309A Fracture of unspecified metatarsal bone(s), unspecified foot, initial encounter for closed fracture: Secondary | ICD-10-CM | POA: Diagnosis not present

## 2013-04-04 DIAGNOSIS — S92309A Fracture of unspecified metatarsal bone(s), unspecified foot, initial encounter for closed fracture: Secondary | ICD-10-CM | POA: Diagnosis not present

## 2013-06-29 ENCOUNTER — Other Ambulatory Visit: Payer: Self-pay

## 2013-07-03 ENCOUNTER — Ambulatory Visit (INDEPENDENT_AMBULATORY_CARE_PROVIDER_SITE_OTHER): Payer: Medicare Other | Admitting: Family Medicine

## 2013-07-03 ENCOUNTER — Ambulatory Visit: Payer: Medicare Other

## 2013-07-03 DIAGNOSIS — W57XXXA Bitten or stung by nonvenomous insect and other nonvenomous arthropods, initial encounter: Secondary | ICD-10-CM | POA: Diagnosis not present

## 2013-07-03 DIAGNOSIS — S92919A Unspecified fracture of unspecified toe(s), initial encounter for closed fracture: Secondary | ICD-10-CM

## 2013-07-03 DIAGNOSIS — S90569A Insect bite (nonvenomous), unspecified ankle, initial encounter: Secondary | ICD-10-CM | POA: Diagnosis not present

## 2013-07-03 DIAGNOSIS — S92912A Unspecified fracture of left toe(s), initial encounter for closed fracture: Secondary | ICD-10-CM

## 2013-07-03 DIAGNOSIS — S90562A Insect bite (nonvenomous), left ankle, initial encounter: Secondary | ICD-10-CM

## 2013-07-03 MED ORDER — TRIAMCINOLONE ACETONIDE 0.1 % EX CREA: TOPICAL_CREAM | Freq: Two times a day (BID) | CUTANEOUS | Status: DC

## 2013-07-03 NOTE — Patient Instructions (Addendum)
Continue to buddy tape for 4-6 weeks and post op shoe for 4 weeks.  If not improving in next 2 weeks - return for recheck.  Try the steroid cream to the rash on the inside of the ankle - if any worsening/spread - return to clinic.

## 2013-07-03 NOTE — Progress Notes (Signed)
  Subjective:    Patient ID: Doris Wilkerson, female    DOB: [birth year], 67 y.o.   MRN: [id number]  HPI Doris Wilkerson is a 67 y.o. female ?bug bite on L ankle few days ago - blistering 2 days ago - tried ivy dry after skeeter stick.  Itches. Did not see what bit her.  Pruning in the yard.  No known poison ivy. No other rash, but was wearing gloves. Tetanus up to date.   In kitchen this am - pinky toe hit buffet table - toe bent out, relocated by pressing inward, then ice to area. No prior broken toe.  Review of Systems  Constitutional: Negative for fever and chills.  Musculoskeletal: Positive for arthralgias (L 5th toe. ).  Skin: Positive for rash (inside of ankle. ).       Objective:   Physical Exam  Vitals reviewed. Constitutional: She is oriented to person, place, and time. She appears well-developed and well-nourished. No distress.  Pulmonary/Chest: Effort normal.  Musculoskeletal: She exhibits tenderness.       Left foot: She exhibits normal capillary refill.       Feet:  Neurological: She is oriented to person, place, and time.  Skin: Skin is warm and dry. Rash noted.     Psychiatric: She has a normal mood and affect. Her behavior is normal.   [facility] reading (PRIMARY) by  Dr. Neva Seat: L 5th toe: oblique/spiral fracture of 5th proximal phalynx.       Assessment & Plan:  Doris Wilkerson is a 67 y.o. female  Pain of toe, left - Plan: DG Toe 5th Left - fracture as above.   Buddy tape for 4-6 weeks, post op shoe (had one from prior) for next 4 weeks. rtc precautiions.  otc tylenol if needed. Declined stronger med.   Insect bite of ankle with local reaction, left, initial encounter - Plan: triamcinolone cream (KENALOG) 0.1 %.  Insect vs spider bite vs contact derm with poison ivy. Local care - with TAC 0.1% BID prn - rtc precautions.   Patient Instructions  Continue to buddy tape for 4-6 weeks and post op shoe for 4 weeks.  If not improving in next 2 weeks - return for recheck.  Try the  steroid cream to the rash on the inside of the ankle - if any worsening/spread - return to clinic.

## 2013-08-14 ENCOUNTER — Other Ambulatory Visit: Payer: Self-pay | Admitting: Family Medicine

## 2013-09-01 ENCOUNTER — Other Ambulatory Visit: Payer: Self-pay | Admitting: Family Medicine

## 2013-09-12 ENCOUNTER — Ambulatory Visit (INDEPENDENT_AMBULATORY_CARE_PROVIDER_SITE_OTHER): Payer: Medicare Other | Admitting: Radiology

## 2013-09-12 DIAGNOSIS — Z23 Encounter for immunization: Secondary | ICD-10-CM | POA: Diagnosis not present

## 2013-09-12 NOTE — Patient Instructions (Signed)
Influenza Vaccine (Flu Vaccine, Inactivated) 2013 2014 What You Need to Know WHY GET VACCINATED?  Influenza ("flu") is a contagious disease that spreads around the [place] every winter, usually between October and May.  Flu is caused by the influenza virus, and can be spread by coughing, sneezing, and close contact.  Anyone can get flu, but the risk of getting flu is highest among children. Symptoms come on suddenly and may last several days. They can include:  Fever or chills.  Sore throat.  Muscle aches.  Fatigue.  Cough.  Headache.  Runny or stuffy nose. Flu can make some people much sicker than others. These people include young children, people 65 and older, pregnant women, and people with certain health conditions such as heart, lung or kidney disease, or a weakened immune system. Flu vaccine is especially important for these people, and anyone in close contact with them. Flu can also lead to pneumonia, and make existing medical conditions worse. It can cause diarrhea and seizures in children. Each year thousands of people in the [place] die from flu, and many more are hospitalized. Flu vaccine is the best protection we have from flu and its complications. Flu vaccine also helps prevent spreading flu from person to person. INACTIVATED FLU VACCINE There are 2 types of influenza vaccine:  You are getting an inactivated flu vaccine, which does not contain any live influenza virus. It is given by injection with a needle, and often called the "flu shot."  A different live, attenuated (weakened) influenza vaccine is sprayed into the nostrils. This vaccine is described in a separate Vaccine Information Statement. Flu vaccine is recommended every year. Children 6 months through 8 years of age should get 2 doses the first year they get vaccinated. Flu viruses are always changing. Each year's flu vaccine is made to protect from viruses that are most likely to cause disease  that year. While flu vaccine cannot prevent all cases of flu, it is our best defense against the disease. Inactivated flu vaccine protects against 3 or 4 different influenza viruses. It takes about 2 weeks for protection to develop after the vaccination, and protection lasts several months to a year. Some illnesses that are not caused by influenza virus are often mistaken for flu. Flu vaccine will not prevent these illnesses. It can only prevent influenza. A "high-dose" flu vaccine is available for people 65 years of age and older. The person giving you the vaccine can tell you more about it. Some inactivated flu vaccine contains a very small amount of a mercury-based preservative called thimerosal. Studies have shown that thimerosal in vaccines is not harmful, but flu vaccines that do not contain a preservative are available. SOME PEOPLE SHOULD NOT GET THIS VACCINE Tell the person who gives you the vaccine:  If you have any severe (life-threatening) allergies. If you ever had a life-threatening allergic reaction after a dose of flu vaccine, or have a severe allergy to any part of this vaccine, you may be advised not to get a dose. Most, but not all, types of flu vaccine contain a small amount of egg.  If you ever had Guillain Barr Syndrome (a severe paralyzing illness, also called GBS). Some people with a history of GBS should not get this vaccine. This should be discussed with your doctor.  If you are not feeling well. They might suggest waiting until you feel better. But you should come back. RISKS OF A VACCINE REACTION With a vaccine, like any medicine, there   is a chance of side effects. These are usually mild and go away on their own. Serious side effects are also possible, but are very rare. Inactivated flu vaccine does not contain live flu virus, sogetting flu from this vaccine is not possible. Brief fainting spells and related symptoms (such as jerking movements) can happen after any medical  procedure, including vaccination. Sitting or lying down for about 15 minutes after a vaccination can help prevent fainting and injuries caused by falls. Tell your doctor if you feel dizzy or lightheaded, or have vision changes or ringing in the ears. Mild problems following inactivated flu vaccine:  Soreness, redness, or swelling where the shot was given.  Hoarseness; sore, red or itchy eyes; or cough.  Fever.  Aches.  Headache.  Itching.  Fatigue. If these problems occur, they usually begin soon after the shot and last 1 or 2 days. Moderate problems following inactivated flu vaccine:  Young children who get inactivated flu vaccine and pneumococcal vaccine (PCV13) at the same time may be at increased risk for seizures caused by fever. Ask your doctor for more information. Tell your doctor if a child who is getting flu vaccine has ever had a seizure. Severe problems following inactivated flu vaccine:  A severe allergic reaction could occur after any vaccine (estimated less than 1 in a million doses).  There is a small possibility that inactivated flu vaccine could be associated with Guillan Barr Syndrome (GBS), no more than 1 or 2 cases per million people vaccinated. This is much lower than the risk of severe complications from flu, which can be prevented by flu vaccine. The safety of vaccines is always being monitored. For more information, visit: www.cdc.gov/vaccinesafety/ WHAT IF THERE IS A SERIOUS REACTION? What should I look for?  Look for anything that concerns you, such as signs of a severe allergic reaction, very high fever, or behavior changes. Signs of a severe allergic reaction can include hives, swelling of the face and throat, difficulty breathing, a fast heartbeat, dizziness, and weakness. These would start a few minutes to a few hours after the vaccination. What should I do?  If you think it is a severe allergic reaction or other emergency that cannot wait, call [phone] 1 1  or get the person to the nearest hospital. Otherwise, call your doctor.  Afterward, the reaction should be reported to the Vaccine Adverse Event Reporting System (VAERS). Your doctor might file this report, or you can do it yourself through the VAERS website at www.vaers.hhs.gov, or by calling 1-800-822-7967. VAERS is only for reporting reactions. They do not give medical advice. THE NATIONAL VACCINE INJURY COMPENSATION PROGRAM The National Vaccine Injury Compensation Program (VICP) is a federal program that was created to compensate people who may have been injured by certain vaccines. Persons who believe they may have been injured by a vaccine can learn about the program and about filing a claim by calling 1-800-338-2382 or visiting the VICP website at www.hrsa.gov/vaccinecompensation HOW CAN I LEARN MORE?  Ask your doctor.  Call your local or state health department.  Contact the Centers for Disease Control and Prevention (CDC):  Call 1-800-232-4636 (1-800-CDC-INFO) or  Visit CDC's website at www.cdc.gov/flu CDC Inactivated Influenza Vaccine Interim VIS (06/18/12) Document Released: 09/04/2006 Document Revised: 08/04/2012 Document Reviewed: 06/18/2012 Exit[employer] Patient Information 2014 [employer].  

## 2013-09-25 ENCOUNTER — Other Ambulatory Visit: Payer: Self-pay | Admitting: Family Medicine

## 2013-10-15 ENCOUNTER — Ambulatory Visit (INDEPENDENT_AMBULATORY_CARE_PROVIDER_SITE_OTHER): Payer: Medicare Other | Admitting: Family Medicine

## 2013-10-15 VITALS — BP 126/74 | HR 63 | Temp 98.8°F | Resp 16 | Ht 67.0 in | Wt 156.0 lb

## 2013-10-15 DIAGNOSIS — E039 Hypothyroidism, unspecified: Secondary | ICD-10-CM | POA: Diagnosis not present

## 2013-10-15 DIAGNOSIS — Z23 Encounter for immunization: Secondary | ICD-10-CM | POA: Diagnosis not present

## 2013-10-15 DIAGNOSIS — J309 Allergic rhinitis, unspecified: Secondary | ICD-10-CM

## 2013-10-15 DIAGNOSIS — I10 Essential (primary) hypertension: Secondary | ICD-10-CM | POA: Diagnosis not present

## 2013-10-15 DIAGNOSIS — E785 Hyperlipidemia, unspecified: Secondary | ICD-10-CM

## 2013-10-15 DIAGNOSIS — E7800 Pure hypercholesterolemia, unspecified: Secondary | ICD-10-CM | POA: Diagnosis not present

## 2013-10-15 LAB — COMPREHENSIVE METABOLIC PANEL
Albumin: 4.6 g/dL (ref 3.5–5.2)
Alkaline Phosphatase: 68 U/L (ref 39–117)
BUN: 20 mg/dL (ref 6–23)
CO2: 29 mEq/L (ref 19–32)
Creat: 0.85 mg/dL (ref 0.50–1.10)
Glucose, Bld: 94 mg/dL (ref 70–99)
Potassium: 4.5 mEq/L (ref 3.5–5.3)
Sodium: 139 mEq/L (ref 135–145)
Total Bilirubin: 0.6 mg/dL (ref 0.3–1.2)

## 2013-10-15 LAB — LIPID PANEL
Cholesterol: 174 mg/dL (ref 0–200)
Total CHOL/HDL Ratio: 2.8 Ratio
Triglycerides: 54 mg/dL (ref <150)
VLDL: 11 mg/dL (ref 0–40)

## 2013-10-15 MED ORDER — ATORVASTATIN CALCIUM 40 MG PO TABS: 40.0000 mg | ORAL_TABLET | Freq: Every day | ORAL | Status: DC

## 2013-10-15 MED ORDER — BENAZEPRIL HCL 10 MG PO TABS: 10.0000 mg | ORAL_TABLET | Freq: Every day | ORAL | Status: DC

## 2013-10-15 MED ORDER — LEVOTHYROXINE SODIUM 25 MCG PO TABS: ORAL_TABLET | ORAL | Status: DC

## 2013-10-15 MED ORDER — FLUTICASONE PROPIONATE 50 MCG/ACT NA SUSP: 2.0000 | Freq: Every day | NASAL | Status: DC

## 2013-10-15 NOTE — Patient Instructions (Signed)
You should receive a call or letter about your lab results within the next week to 10 days. We can send you a copy if needed for cardiologist visit.  Pneumonia vaccine given today. Plan on physical in 6 months.  Return to the clinic or go to the nearest emergency room if any of your symptoms worsen or new symptoms occur.

## 2013-10-15 NOTE — Progress Notes (Addendum)
Subjective:   This chart was scribed for Doris Wilkerson, by Ladona Ridgel Calla Wedekind, ED scribe. This patient was seen in room 13 and the patient's care was started at 930 AM.   Patient ID: Doris Wilkerson, female    DOB: [birth year], 67 y.o.   MRN: [id number]  HPI Doris Wilkerson is a 67 y.o. female PCP: Shade Flood, MD  Today Doris Wilkerson presents to the [facility] clinic for blood work and medication refill. She has had the flu shot, has not had pneumonia vaccine had once before but not since age of 43. She needs refill of Benazepril, Lipitor and synthroid. Hx allergic rhinitis, takes Flonase as needed, sx controlled,   1. HTN - She is fasting today -  She states has not been taking her BP at home. She denies any episodes of CP or SOB. She denies abdominal pain or blood in stool.  Takes Benazepril and Metoprolol  She is followed by [facility] her cardiologist and has hx of CAD, s/p LAD, stent at 67 years old. Due for appt w/ Dr. Nadara Eaton, her cardiologist, next month - 10/26/13  Here for lab work today prior to that appt.  Creatinine normal at 0.77 in february  She denies any CP or light headiness, she states has not been exercising regularly.    2. Hypothyroidism Takes synthroid, 25 mcg qd Last TSH feb 25-nml at 2.85 Weight today is 156 lb. And last visit was 155 lb. She denies any hot spells or hot flashes more than normal.  She states no changes to her hair or skin.   3. Hyperlipidemia  Takes Lipitor 40 mg qd Last lipid panel feb 25 w/normal except LDL 108  Occasional joint pains but no new joint arthralgias or pain.  She denies any side effects taking her lipitor, she states mild joint pain which is baseline for her.  Results for orders placed in visit on 01/18/13  LIPID PANEL      Result Value Range   Cholesterol 193  0 - 200 mg/dL   Triglycerides 60  <413 mg/dL   HDL 73  >24 mg/dL   Total CHOL/HDL Ratio 2.6     VLDL 12  0 - 40 mg/dL   LDL Cholesterol 401 (*) 0 - 99 mg/dL  TSH   Result Value Range   TSH 2.856  0.350 - 4.500 uIU/mL  COMPREHENSIVE METABOLIC PANEL      Result Value Range   Sodium 141  135 - 145 mEq/L   Potassium 4.3  3.5 - 5.3 mEq/L   Chloride 106  96 - 112 mEq/L   CO2 28  19 - 32 mEq/L   Glucose, Bld 91  70 - 99 mg/dL   BUN 18  6 - 23 mg/dL   Creat 0.27  2.53 - 6.64 mg/dL   Total Bilirubin 0.6  0.3 - 1.2 mg/dL   Alkaline Phosphatase 69  39 - 117 U/L   AST 18  0 - 37 U/L   ALT 14  0 - 35 U/L   Total Protein 7.0  6.0 - 8.3 g/dL   Albumin 4.6  3.5 - 5.2 g/dL   Calcium 40.3  8.4 - 47.4 mg/dL   Patient Active Problem List   Diagnosis Date Noted  . Pain in joint, ankle and foot 02/14/2013  . Foot fracture 02/14/2013  . CAD (coronary artery disease) 12/09/2011  . Dyslipidemia 12/09/2011  . Osteopenia 12/09/2011  . Hypothyroid 12/09/2011  . Asthma 12/09/2011  Past Medical History  Diagnosis Date  . Thyroid disease    Past Surgical History  Procedure Laterality Date  . Tonsillectomy  1951  . Orthopedic surgery  2012  . Eye surgery     Allergies  Allergen Reactions  . Plavix [Clopidogrel Bisulfate]    Prior to Admission medications   Medication Sig Start Date End Date Taking? Authorizing Provider  aspirin 81 MG tablet Take 81 mg by mouth daily.    Yes Dois Davenport, MD  atorvastatin (LIPITOR) 40 MG tablet Take 1 tablet (40 mg total) by mouth daily. 01/17/13  Yes Shade Flood, MD  benazepril (LOTENSIN) 10 MG tablet Take 1 tablet (10 mg total) by mouth daily. Needs office visit 09/25/13  Yes Heather M Marte, PA-C  levothyroxine (SYNTHROID, LEVOTHROID) 25 MCG tablet TAKE 1 TABLET (25 MCG TOTAL) BY MOUTH DAILY. 08/14/13  Yes Ryan M Dunn, PA-C  metoprolol tartrate (LOPRESSOR) 25 MG tablet Take 1 tablet (25 mg total) by mouth daily. 01/31/12  Yes Rickard Patience, PA-C  OVER THE COUNTER MEDICATION Vitamin D 2000iu daily.   Yes Historical Provider, MD  doxycycline (VIBRA-TABS) 100 MG tablet Take 1 tablet (100 mg total) by mouth 2 (two) times  daily. 11/19/12   Shade Flood, MD  fluticasone (FLONASE) 50 MCG/ACT nasal spray Place 2 sprays into the nose daily. 07/08/12 07/08/13  Elvina Sidle, MD  triamcinolone cream (KENALOG) 0.1 % Apply topically 2 (two) times daily. 07/03/13   Shade Flood, MD   History   Social History  . Marital Status: Married    Spouse Name: N/A    Number of Children: N/A  . Years of Education: N/A   Occupational History  . Not on file.   Social History Main Topics  . Smoking status: Never Smoker   . Smokeless tobacco: Not on file  . Alcohol Use: 1.0 oz/week    2 drink(s) per week  . Drug Use: No  . Sexual Activity: Not on file   Other Topics Concern  . Not on file   Social History Narrative  . No narrative on file     Review of Systems  Constitutional: Negative for fatigue and unexpected weight change.  Respiratory: Negative for chest tightness and shortness of breath.   Cardiovascular: Negative for chest pain, palpitations and leg swelling.  Gastrointestinal: Negative for abdominal pain and blood in stool.  Neurological: Negative for dizziness, syncope, light-headedness and headaches.      Objective:   Physical Exam  Vitals reviewed. Constitutional: She is oriented to person, place, and time. She appears well-developed and well-nourished.  HENT:  Head: Normocephalic and atraumatic.  Eyes: Conjunctivae and EOM are normal. Pupils are equal, round, and reactive to light.  Neck: Carotid bruit is not present.  Cardiovascular: Normal rate, regular rhythm, normal heart sounds and intact distal pulses.   Pulmonary/Chest: Effort normal and breath sounds normal.  Abdominal: Soft. She exhibits no pulsatile midline mass. There is no tenderness.  Neurological: She is alert and oriented to person, place, and time.  Skin: Skin is warm and dry.  Psychiatric: She has a normal mood and affect. Her behavior is normal.    Filed Vitals:   10/15/13 0914  BP: 126/74  Pulse: 63  Temp: 98.8  F (37.1 C)  TempSrc: Oral  Resp: 16  Height: 5\' 7"  (1.702 m)  Weight: 156 lb (70.761 kg)  SpO2: 99%   No exam data present     Assessment & Plan:  Doris Wilkerson is a 67 y.o. female Unspecified essential hypertension - Plan: TSH, levothyroxine (SYNTHROID, LEVOTHROID) 25 MCG tablet, benazepril (LOTENSIN) 10 MG tablet. Controlled, stable, refilled meds - follow up planned with cardiologist - can discuss type and amount of exercise at that time.   Unspecified hypothyroidism - prior controlled. check tsh, meds refilled.   Dyslipidemia - Plan: Comprehensive metabolic panel, Lipid panel, atorvastatin (LIPITOR) 40 MG tablet refilled. Lipids pending - prior borderline LDL. Goal of atleast under 100 vs 70 with hx of CAD.   Cont flonase for allergies prn.   Need for prophylactic vaccination against Streptococcus pneumoniae (pneumococcus) - Plan: Pneumococcal polysaccharide vaccine 23-valent greater than or equal to 2yo subcutaneous/IM Given.  plan on cpe/medicare physical in 6 months.   Meds ordered this encounter  Medications  . levothyroxine (SYNTHROID, LEVOTHROID) 25 MCG tablet    Sig: TAKE 1 TABLET (25 MCG TOTAL) BY MOUTH DAILY.    Dispense:  90 tablet    Refill:  1  . benazepril (LOTENSIN) 10 MG tablet    Sig: Take 1 tablet (10 mg total) by mouth daily.    Dispense:  90 tablet    Refill:  1  . atorvastatin (LIPITOR) 40 MG tablet    Sig: Take 1 tablet (40 mg total) by mouth daily.    Dispense:  90 tablet    Refill:  1  . fluticasone (FLONASE) 50 MCG/ACT nasal spray    Sig: Place 2 sprays into both nostrils daily.    Dispense:  1 g    Refill:  6     Patient Instructions  You should receive a call or letter about your lab results within the next week to 10 days. We can send you a copy if needed for cardiologist visit.  Pneumonia vaccine given today. Plan on physical in 6 months.  Return to the clinic or go to the nearest emergency room if any of your symptoms worsen or new  symptoms occur.

## 2013-10-17 ENCOUNTER — Telehealth: Payer: Self-pay | Admitting: Family Medicine

## 2013-10-17 NOTE — Telephone Encounter (Signed)
Appt has been made for CPE in May, 2015.  Pt stated she had a reaction to the pneumonia shot, (swelling at site of injection) but it 'seems to be getting better". She denies any other symptoms. I informed pt that if symptoms do not continue to improve to RTC.

## 2013-10-17 NOTE — Telephone Encounter (Signed)
Message copied by Tilman Neat on Mon Oct 17, 2013 11:54 AM ------      Message from: Meredith Staggers R      Created: Sat Oct 15, 2013 10:28 AM       6 months for medicare physical - Neva Seat ------

## 2013-10-21 ENCOUNTER — Encounter: Payer: Self-pay | Admitting: Family Medicine

## 2013-10-21 ENCOUNTER — Telehealth: Payer: Self-pay | Admitting: Radiology

## 2013-10-21 NOTE — Telephone Encounter (Signed)
----   Message -----    From: Osborne Casco    Sent: 10/21/2013   8:30 AM      To: [facility] Clinical Message Pool Subject: Non-Urgent Medical Question                    Please send lipid test results to Dr. Jacinto Halim  Thanks Vernona Rieger

## 2013-10-21 NOTE — Telephone Encounter (Signed)
Lipid test results faxed to Dr. Verl Dicker office.

## 2013-10-24 ENCOUNTER — Other Ambulatory Visit: Payer: Self-pay

## 2013-10-24 DIAGNOSIS — Z1231 Encounter for screening mammogram for malignant neoplasm of breast: Secondary | ICD-10-CM

## 2013-10-26 DIAGNOSIS — Z9861 Coronary angioplasty status: Secondary | ICD-10-CM | POA: Diagnosis not present

## 2013-10-26 DIAGNOSIS — E7800 Pure hypercholesterolemia, unspecified: Secondary | ICD-10-CM | POA: Diagnosis not present

## 2013-10-26 DIAGNOSIS — I2510 Atherosclerotic heart disease of native coronary artery without angina pectoris: Secondary | ICD-10-CM | POA: Diagnosis not present

## 2013-10-30 ENCOUNTER — Encounter: Payer: Self-pay | Admitting: Family Medicine

## 2013-10-30 ENCOUNTER — Other Ambulatory Visit: Payer: Self-pay | Admitting: Family Medicine

## 2013-11-28 ENCOUNTER — Ambulatory Visit
Admission: RE | Admit: 2013-11-28 | Discharge: 2013-11-28 | Disposition: A | Discharge status: Home / Self Care | Payer: Medicare Other | Source: Ambulatory Visit

## 2013-11-28 DIAGNOSIS — Z1231 Encounter for screening mammogram for malignant neoplasm of breast: Secondary | ICD-10-CM

## 2014-01-12 ENCOUNTER — Telehealth: Payer: Self-pay

## 2014-01-12 DIAGNOSIS — E785 Hyperlipidemia, unspecified: Secondary | ICD-10-CM

## 2014-01-12 DIAGNOSIS — I10 Essential (primary) hypertension: Secondary | ICD-10-CM

## 2014-01-12 MED ORDER — LEVOTHYROXINE SODIUM 25 MCG PO TABS: ORAL_TABLET | ORAL | Status: DC

## 2014-01-12 MED ORDER — BENAZEPRIL HCL 10 MG PO TABS: 10.0000 mg | ORAL_TABLET | Freq: Every day | ORAL | Status: DC

## 2014-01-12 MED ORDER — ATORVASTATIN CALCIUM 40 MG PO TABS: 40.0000 mg | ORAL_TABLET | Freq: Every day | ORAL | Status: DC

## 2014-01-12 MED ORDER — METOPROLOL TARTRATE 25 MG PO TABS: 25.0000 mg | ORAL_TABLET | Freq: Every day | ORAL | Status: DC

## 2014-01-12 NOTE — Telephone Encounter (Signed)
Pt states she is changing to mail order pharmacy ([facility]) due to insurance. States [facility] said she needs to have new rx's sent to them since she hasn't used them before. Pt sees Dr [name]  Needs  Lipitor (40mg ) Lotensin (10mg ) Synthoird,Levothroid (25 mcg) Lopressor (25mg )  All sent to [facility]. She filled out new pt form and i faxed it to [phone]  Pt best: [phone] bf

## 2014-01-12 NOTE — Telephone Encounter (Signed)
Sent in remaining refill of each of these medications to the new mail order pharmacy.  Pt advised.

## 2014-04-10 ENCOUNTER — Encounter: Payer: Self-pay | Admitting: Family Medicine

## 2014-04-10 ENCOUNTER — Ambulatory Visit (INDEPENDENT_AMBULATORY_CARE_PROVIDER_SITE_OTHER): Payer: Medicare Other | Admitting: Family Medicine

## 2014-04-10 VITALS — BP 100/62 | HR 60 | Temp 98.3°F | Resp 16 | Ht 66.5 in | Wt 156.0 lb

## 2014-04-10 DIAGNOSIS — E785 Hyperlipidemia, unspecified: Secondary | ICD-10-CM

## 2014-04-10 DIAGNOSIS — S8010XA Contusion of unspecified lower leg, initial encounter: Secondary | ICD-10-CM

## 2014-04-10 DIAGNOSIS — I2510 Atherosclerotic heart disease of native coronary artery without angina pectoris: Secondary | ICD-10-CM | POA: Diagnosis not present

## 2014-04-10 DIAGNOSIS — K625 Hemorrhage of anus and rectum: Secondary | ICD-10-CM

## 2014-04-10 DIAGNOSIS — Z0000 Encounter for general adult medical examination without abnormal findings: Secondary | ICD-10-CM

## 2014-04-10 DIAGNOSIS — D899 Disorder involving the immune mechanism, unspecified: Secondary | ICD-10-CM | POA: Diagnosis not present

## 2014-04-10 DIAGNOSIS — E039 Hypothyroidism, unspecified: Secondary | ICD-10-CM

## 2014-04-10 DIAGNOSIS — I10 Essential (primary) hypertension: Secondary | ICD-10-CM | POA: Diagnosis not present

## 2014-04-10 DIAGNOSIS — S39011A Strain of muscle, fascia and tendon of abdomen, initial encounter: Secondary | ICD-10-CM

## 2014-04-10 LAB — CBC WITH DIFFERENTIAL/PLATELET
Basophils Absolute: 0 10*3/uL (ref 0.0–0.1)
Basophils Relative: 1 % (ref 0–1)
EOS ABS: 0.2 10*3/uL (ref 0.0–0.7)
Eosinophils Relative: 4 % (ref 0–5)
HEMATOCRIT: 38.7 % (ref 36.0–46.0)
HEMOGLOBIN: 13 g/dL (ref 12.0–15.0)
Lymphocytes Relative: 33 % (ref 12–46)
Lymphs Abs: 1.5 10*3/uL (ref 0.7–4.0)
MCH: 28.6 pg (ref 26.0–34.0)
MCHC: 33.6 g/dL (ref 30.0–36.0)
MCV: 85.2 fL (ref 78.0–100.0)
MONO ABS: 0.4 10*3/uL (ref 0.1–1.0)
MONOS PCT: 9 % (ref 3–12)
NEUTROS PCT: 53 % (ref 43–77)
Neutro Abs: 2.4 10*3/uL (ref 1.7–7.7)
Platelets: 305 10*3/uL (ref 150–400)
RBC: 4.54 MIL/uL (ref 3.87–5.11)
RDW: 14.2 % (ref 11.5–15.5)
WBC: 4.5 10*3/uL (ref 4.0–10.5)

## 2014-04-10 MED ORDER — LEVOTHYROXINE SODIUM 25 MCG PO TABS: ORAL_TABLET | ORAL | Status: DC

## 2014-04-10 MED ORDER — METOPROLOL TARTRATE 25 MG PO TABS: 25.0000 mg | ORAL_TABLET | Freq: Every day | ORAL | Status: DC

## 2014-04-10 MED ORDER — BENAZEPRIL HCL 10 MG PO TABS: 10.0000 mg | ORAL_TABLET | Freq: Every day | ORAL | Status: DC

## 2014-04-10 MED ORDER — ATORVASTATIN CALCIUM 40 MG PO TABS: 40.0000 mg | ORAL_TABLET | Freq: Every day | ORAL | Status: DC

## 2014-04-10 NOTE — Progress Notes (Signed)
   Subjective:    Patient ID: Doris Wilkerson, female    DOB: 04/16/1946, 68 y.o.   MRN: [id number]  HPI    Review of Systems  Constitutional: Negative.   HENT: Negative.   Eyes: Negative.   Respiratory: Negative.   Cardiovascular: Negative.   Endocrine: Negative.   Genitourinary: Negative.   Musculoskeletal: Positive for arthralgias.  Skin: Negative.   Allergic/Immunologic: Negative.   Neurological: Negative.   Hematological: Negative.   Psychiatric/Behavioral: Negative.        Objective:   Physical Exam        Assessment & Plan:

## 2014-04-10 NOTE — Patient Instructions (Signed)
Leg pain - suspected contusion/bruise of soft tissue - if not continuing to improve, shrink over next few weeks - return for recheck.  Abdominal wall pain - suspect this is due to muscle strain of wall, but if any localized swelling or worsening of pain - return for recheck. "core strengthening" as discussed is ok if not causing more pain.  Return for repeat exam if any further bleeding noted with wiping.  Fiber and water in diet to lessen risk of constipation.  Again - if bleeding recurs, need to recheck to determine if external cause (fissure or tear in skin or hemorrhoid).  Return to the clinic or go to the nearest emergency room if any of your symptoms worsen or new symptoms occur.  You should receive a call or letter about your lab results within the next week to 10 days.   Keeping You Healthy  Get These Tests  Blood Pressure- Have your blood pressure checked by your healthcare provider at least once a year.  Normal blood pressure is 120/80.  Weight- Have your body mass index (BMI) calculated to screen for obesity.  BMI is a measure of body fat based on height and weight.  You can calculate your own BMI at [employer]  Cholesterol- Have your cholesterol checked every year.  Diabetes- Have your blood sugar checked every year if you have high blood pressure, high cholesterol, a family history of diabetes or if you are overweight.  Pap Smear- Have a pap smear every 1 to 3 years if you have been sexually active.  If you are older than 65 and recent pap smears have been normal you may not need additional pap smears.  In addition, if you have had a hysterectomy  For benign disease additional pap smears are not necessary.  Mammogram-Yearly mammograms are essential for early detection of breast cancer  Screening for Colon Cancer- Colonoscopy starting at age 46. Screening may begin sooner depending on your family history and other health conditions.  Follow up colonoscopy as directed  by your Gastroenterologist.  Screening for Osteoporosis- Screening begins at age 9 with bone density scanning, sooner if you are at higher risk for developing Osteoporosis.  Get these medicines  Calcium with Vitamin D- Your body requires 1200-1500 mg of Calcium a day and [phone] IU of Vitamin D a day.  You can only absorb 500 mg of Calcium at a time therefore Calcium must be taken in 2 or 3 separate doses throughout the day.  Hormones- Hormone therapy has been associated with increased risk for certain cancers and heart disease.  Talk to your healthcare provider about if you need relief from menopausal symptoms.  Aspirin- Ask your healthcare provider about taking Aspirin to prevent Heart Disease and Stroke.  Get these Immuniztions  Flu shot- Every fall  Pneumonia shot- Once after the age of 51; if you are younger ask your healthcare provider if you need a pneumonia shot.  Tetanus- Every ten years.  Zostavax- Once after the age of 54 to prevent shingles.  Take these steps  Don't smoke- Your healthcare provider can help you quit. For tips on how to quit, ask your healthcare provider or go to www.smokefree.gov or call 1-800 QUIT-NOW.  Be physically active- Exercise 5 days a week for a minimum of 30 minutes.  If you are not already physically active, start slow and gradually work up to 30 minutes of moderate physical activity.  Try walking, dancing, bike riding, swimming, etc.  Eat a healthy diet- Eat  a variety of healthy foods such as fruits, vegetables, whole grains, low fat milk, low fat cheeses, yogurt, lean meats, chicken, fish, eggs, dried beans, tofu, etc.  For more information go to www.thenutritionsource.org  Dental visit- Brush and floss teeth twice daily; visit your dentist twice a year.  Eye exam- Visit your Optometrist or Ophthalmologist yearly.  Drink alcohol in moderation- Limit alcohol intake to one drink or less a day.  Never drink and drive.  Depression- Your  emotional health is as important as your physical health.  If you're feeling down or losing interest in things you normally enjoy, please talk to your healthcare provider.  Seat Belts- can save your life; always wear one  Smoke/Carbon Monoxide detectors- These detectors need to be installed on the appropriate level of your home.  Replace batteries at least once a year.  Violence- If anyone is threatening or hurting you, please tell your healthcare provider.  Living Will/ Health care power of attorney- Discuss with your healthcare provider and family.

## 2014-04-10 NOTE — Progress Notes (Addendum)
Subjective:  This chart was scribed for [name], MD, by Neta Ehlers, ED Scribe. This  patient's care was started at 3:56 PM.   Patient ID: Doris Wilkerson, female    DOB: [birth year], 68 y.o.   MRN: [id number]  HPI  Doris Wilkerson is a 68 y.o. female who presents to [facility] for a physical and medication refill.   The pt reports intermittent pains to her groin and upper, medial thighs. She reports the pain is noticeable after intense gardening. The pt denies recent falls or injuries.   She reports improvement to her shoulders stating they are "cured."   She also reports a knot to her right lower shin which is intermittently painful at night. Doris Wilkerson states the knot has been present for several months, and it has been gradually decreasing in size.   Doris Wilkerson also reports intermittent episodes of "pink"-colored blood present on toilet tissue after a BM. She reports the episodes occur once every few weeks, and they typically occur after she has experienced constipation. She reports these episodes have occurred for the past three months, and that they are a new issue. She denies hematochezia and dark, tarry stools. She also denies soreness after a BM.   Doris Wilkerson dog passed away unexpectedly this morning.   Health maintenance:  Fall screening unsteady only sometimes on rough surfaces. Rare difficulties with steps or stairs. Rare use of stepstool. Only a few things to steady around furniture. No other concerns on assessment. No recent falls. BECK Depression Scale is normal at a score of 4. Advanced directives has power of attorney and living will, October 2006.  Pap testing was negative/normal January 2013.  Mammogram: No acute findings in December 2014. Reccommended repeat one year. She denies any lumps or bumps.  Optho: Vision screen noted.  Dentist: She is followed by a dentist.  Colonoscopy: January 2006. She denies a family h/o colon cancer.  Tetanus: August 2007. Tetanus booster will be  needed next year,  Pneumonia Vaccine:  Received PCV-23 in November 2014. Pt agreed to Prevnar vaccine next year.  Shingles December 2009 Zostavax  HTN: H/o CAD. Status post LAD stent at 68 years old. Followed by Dr. Einar Gip annually. Renal function normal on November labs. Last stress test was approximately a year ago. She denies chest pain or SOB with exercise. The pt states she gardens for exercise, and that she is more diaphoretic and easily fatigued than she used to be. However, she states these are not new issues.   Hypothyroidism: TSH normal at 2.15 in November 2014.   Hyperlipidemia: Takes Lipitor 40 mg Qd. The pt states she is taking cholesterol medicine as prescribed. Takes Aspirin 81 mg Qd.  Lab Results  Component Value Date   CHOL 174 10/15/2013   CHOL 193 01/18/2013   Lab Results  Component Value Date   HDL 63 10/15/2013   HDL 73 01/18/2013   Lab Results  Component Value Date   LDLCALC 100* 10/15/2013   LDLCALC 108* 01/18/2013   Lab Results  Component Value Date   TRIG 54 10/15/2013   TRIG 60 01/18/2013   Lab Results  Component Value Date   CHOLHDL 2.8 10/15/2013   CHOLHDL 2.6 01/18/2013   No results found for this basename: LDLDIRECT    Patient Active Problem List   Diagnosis Date Noted  . Pain in joint, ankle and foot 02/14/2013  . Foot fracture 02/14/2013  . CAD (coronary artery disease) 12/09/2011  . Dyslipidemia  12/09/2011  . Osteopenia 12/09/2011  . Hypothyroid 12/09/2011  . Asthma 12/09/2011   Past Medical History  Diagnosis Date  . Thyroid disease     Past Surgical History  Procedure Laterality Date  . Tonsillectomy  1951  . Orthopedic surgery  2012  . Eye surgery      Allergies  Allergen Reactions  . Plavix [Clopidogrel Bisulfate]     Prior to Admission medications   Medication Sig Start Date End Date Taking? Authorizing Provider  aspirin 81 MG tablet Take 81 mg by mouth daily.    Yes Hayden Rasmussen, MD  atorvastatin (LIPITOR) 40 MG  tablet Take 1 tablet (40 mg total) by mouth daily. 01/12/14  Yes Eleanore E Egan, PA-C  benazepril (LOTENSIN) 10 MG tablet Take 1 tablet (10 mg total) by mouth daily. 01/12/14  Yes Eleanore Kurtis Bushman, PA-C  levothyroxine (SYNTHROID, LEVOTHROID) 25 MCG tablet TAKE 1 TABLET (25 MCG TOTAL) BY MOUTH DAILY. 01/12/14  Yes Eleanore Kurtis Bushman, PA-C  metoprolol tartrate (LOPRESSOR) 25 MG tablet Take 1 tablet (25 mg total) by mouth daily. 01/12/14  Yes Eleanore Kurtis Bushman, PA-C  OVER THE COUNTER MEDICATION Vitamin D 2000iu daily.   Yes Historical Provider, MD  doxycycline (VIBRA-TABS) 100 MG tablet Take 1 tablet (100 mg total) by mouth 2 (two) times daily. 11/19/12   [name], MD  fluticasone (FLONASE) 50 MCG/ACT nasal spray Place 2 sprays into both nostrils daily. 10/15/13 10/15/14  [name], MD  triamcinolone cream (KENALOG) 0.1 % Apply topically 2 (two) times daily. 07/03/13   [name], MD     PCP: [name], MD    Review of Systems  Constitutional: Negative for fatigue and unexpected weight change.  Respiratory: Negative for chest tightness and shortness of breath.   Cardiovascular: Negative for chest pain, palpitations and leg swelling.  Gastrointestinal: Positive for constipation. Negative for abdominal pain and blood in stool.  Neurological: Negative for dizziness, syncope, light-headedness and headaches.    ROS per pt survey and nursing note.     Objective:   Physical Exam  Vitals reviewed. Constitutional: She is oriented to person, place, and time. She appears well-developed and well-nourished.  HENT:  Head: Normocephalic and atraumatic.  Right Ear: External ear normal.  Left Ear: External ear normal.  Mouth/Throat: Oropharynx is clear and moist.  Eyes: Conjunctivae and EOM are normal. Pupils are equal, round, and reactive to light.  Neck: Normal range of motion. Neck supple. Carotid bruit is not present. No thyromegaly present.  Cardiovascular: Normal rate, regular  rhythm, normal heart sounds and intact distal pulses.   No murmur heard. Pulmonary/Chest: Effort normal and breath sounds normal. No respiratory distress. She has no wheezes.  Abdominal: Soft. Bowel sounds are normal. She exhibits no pulsatile midline mass. There is tenderness.  Slight tenderness over the lower right and lower left abdominal wall just lateral to rectus abdominus without apparent defect or bulge.   Genitourinary: Rectal exam shows no external hemorrhoid, no fissure and anal tone normal.  No external hemorrhoid.   Musculoskeletal: Normal range of motion. She exhibits no edema and no tenderness.  Medial to tibia, approximately 3 to 4 inches up from ankle, is a small, mobile nodular area just on the distal leg. No erythema and no ecchymosis. Skin is intact. No warmth. No pain with resisted dorsi or plantar flexion of the foot.  1+ DP on right.   Lymphadenopathy:    She has no cervical adenopathy.  Neurological: She  is alert and oriented to person, place, and time.  Skin: Skin is warm and dry. No rash noted.  Psychiatric: She has a normal mood and affect. Her behavior is normal. Thought content normal.   Filed Vitals:   04/10/14 1517  BP: 100/62  Pulse: 60  Temp: 98.3 F (36.8 C)  TempSrc: Oral  Resp: 16  Height: 5' 6.5" (1.689 m)  Weight: 156 lb (70.761 kg)  SpO2: 97%      Visual Acuity Screening   Right eye Left eye Both eyes  Without correction:     With correction: 20/25 20/25 20/25         Assessment & Plan:   MYLANI BARBUSH is a 68 y.o. female Annual physical exam - Plan: CBC with Differential, TSH, COMPLETE METABOLIC PANEL WITH GFR, Lipid panel  -routine labs, anticipatory guidance given on AVS.  Up to date on screenings. Plan on Prevnar later this year or at next CPE as had PCV 23 in November.   Rectal bleeding - Plan: CBC with Differential  -as noted when has constipation, suspect hemorrhoid or fissure. asx at present, and no findings on external exam.  Plan to recheck when symptomatic and fiber/fluids to lessen constipation risk. Due for repeat colonoscopy next year, but if bleeding recurs - may need GI eval.  HTN (hypertension) - Plan: COMPLETE METABOLIC PANEL WITH GFR  -stable, meds refilled.   CAD (coronary artery disease)  - denies chest pain.  Some fatigue, but no recent change in these symptoms.  Follow up with cardiology to discuss further, sooner if chest pain, increasing DOE or other changes in symptoms - RTC/ER precautions. .   Abdominal wall strain  -no hernia palpated and appears to be in area of area of obliques.  Suspect strain/overuse with gardening, but if not improving next week or two, recheck.   Contusion of leg  -small resolving nodule - suspect soft tissue injury prior. Calves nt, full strength, no bony ttp. rtc precautions.   Other and unspecified hyperlipidemia   - Plan: Lipid panel pending. Continue lipitor.   Unspecified hypothyroidism   - Plan: TSH pending. Cont synthroid.   Meds ordered this encounter  Medications  . metoprolol tartrate (LOPRESSOR) 25 MG tablet    Sig: Take 1 tablet (25 mg total) by mouth daily.    Dispense:  90 tablet    Refill:  0  . levothyroxine (SYNTHROID, LEVOTHROID) 25 MCG tablet    Sig: TAKE 1 TABLET (25 MCG TOTAL) BY MOUTH DAILY.    Dispense:  90 tablet    Refill:  0  . benazepril (LOTENSIN) 10 MG tablet    Sig: Take 1 tablet (10 mg total) by mouth daily.    Dispense:  90 tablet    Refill:  0  . atorvastatin (LIPITOR) 40 MG tablet    Sig: Take 1 tablet (40 mg total) by mouth daily.    Dispense:  90 tablet    Refill:  0   Patient Instructions  Leg pain - suspected contusion/bruise of soft tissue - if not continuing to improve, shrink over next few weeks - return for recheck.  Abdominal wall pain - suspect this is due to muscle strain of wall, but if any localized swelling or worsening of pain - return for recheck. "core strengthening" as discussed is ok if not causing more  pain.  Return for repeat exam if any further bleeding noted with wiping.  Fiber and water in diet to lessen risk of constipation.  Again - if bleeding recurs, need to recheck to determine if external cause (fissure or tear in skin or hemorrhoid).  Return to the clinic or go to the nearest emergency room if any of your symptoms worsen or new symptoms occur.  You should receive a call or letter about your lab results within the next week to 10 days.   Keeping You Healthy  Get These Tests  Blood Pressure- Have your blood pressure checked by your healthcare provider at least once a year.  Normal blood pressure is 120/80.  Weight- Have your body mass index (BMI) calculated to screen for obesity.  BMI is a measure of body fat based on height and weight.  You can calculate your own BMI at [employer]  Cholesterol- Have your cholesterol checked every year.  Diabetes- Have your blood sugar checked every year if you have high blood pressure, high cholesterol, a family history of diabetes or if you are overweight.  Pap Smear- Have a pap smear every 1 to 3 years if you have been sexually active.  If you are older than 65 and recent pap smears have been normal you may not need additional pap smears.  In addition, if you have had a hysterectomy  For benign disease additional pap smears are not necessary.  Mammogram-Yearly mammograms are essential for early detection of breast cancer  Screening for Colon Cancer- Colonoscopy starting at age 43. Screening may begin sooner depending on your family history and other health conditions.  Follow up colonoscopy as directed by your Gastroenterologist.  Screening for Osteoporosis- Screening begins at age 22 with bone density scanning, sooner if you are at higher risk for developing Osteoporosis.  Get these medicines  Calcium with Vitamin D- Your body requires 1200-1500 mg of Calcium a day and [phone] IU of Vitamin D a day.  You can only absorb 500  mg of Calcium at a time therefore Calcium must be taken in 2 or 3 separate doses throughout the day.  Hormones- Hormone therapy has been associated with increased risk for certain cancers and heart disease.  Talk to your healthcare provider about if you need relief from menopausal symptoms.  Aspirin- Ask your healthcare provider about taking Aspirin to prevent Heart Disease and Stroke.  Get these Immuniztions  Flu shot- Every fall  Pneumonia shot- Once after the age of 76; if you are younger ask your healthcare provider if you need a pneumonia shot.  Tetanus- Every ten years.  Zostavax- Once after the age of 61 to prevent shingles.  Take these steps  Don't smoke- Your healthcare provider can help you quit. For tips on how to quit, ask your healthcare provider or go to www.smokefree.gov or call 1-800 QUIT-NOW.  Be physically active- Exercise 5 days a week for a minimum of 30 minutes.  If you are not already physically active, start slow and gradually work up to 30 minutes of moderate physical activity.  Try walking, dancing, bike riding, swimming, etc.  Eat a healthy diet- Eat a variety of healthy foods such as fruits, vegetables, whole grains, low fat milk, low fat cheeses, yogurt, lean meats, chicken, fish, eggs, dried beans, tofu, etc.  For more information go to www.thenutritionsource.org  Dental visit- Brush and floss teeth twice daily; visit your dentist twice a year.  Eye exam- Visit your Optometrist or Ophthalmologist yearly.  Drink alcohol in moderation- Limit alcohol intake to one drink or less a day.  Never drink and drive.  Depression- Your emotional health is as  important as your physical health.  If you're feeling down or losing interest in things you normally enjoy, please talk to your healthcare provider.  Seat Belts- can save your life; always wear one  Smoke/Carbon Monoxide detectors- These detectors need to be installed on the appropriate level of your home.   Replace batteries at least once a year.  Violence- If anyone is threatening or hurting you, please tell your healthcare provider.  Living Will/ Health care power of attorney- Discuss with your healthcare provider and family.        Pt will schedule a follow-up appointment if she experiences blood after a BM again.   I personally performed the services described in this documentation, which was scribed in my presence. The recorded information has been reviewed and considered, and addended by me as needed.

## 2014-04-11 LAB — LIPID PANEL
CHOLESTEROL: 165 mg/dL (ref 0–200)
HDL: 62 mg/dL (ref >39)
LDL Cholesterol: 92 mg/dL (ref 0–99)
TRIGLYCERIDES: 57 mg/dL (ref <150)
Total CHOL/HDL Ratio: 2.7 Ratio
VLDL: 11 mg/dL (ref 0–40)

## 2014-04-11 LAB — COMPLETE METABOLIC PANEL WITH GFR
ALBUMIN: 4.7 g/dL (ref 3.5–5.2)
ALT: 19 U/L (ref 0–35)
AST: 25 U/L (ref 0–37)
Alkaline Phosphatase: 70 U/L (ref 39–117)
BUN: 14 mg/dL (ref 6–23)
CALCIUM: 10.2 mg/dL (ref 8.4–10.5)
CHLORIDE: 105 meq/L (ref 96–112)
CO2: 25 meq/L (ref 19–32)
CREATININE: 0.73 mg/dL (ref 0.50–1.10)
GFR, EST NON AFRICAN AMERICAN: 85 mL/min
GLUCOSE: 98 mg/dL (ref 70–99)
POTASSIUM: 4.3 meq/L (ref 3.5–5.3)
Sodium: 138 mEq/L (ref 135–145)
Total Bilirubin: 0.7 mg/dL (ref 0.2–1.2)
Total Protein: 7.1 g/dL (ref 6.0–8.3)

## 2014-04-11 LAB — TSH: TSH: 2.54 u[IU]/mL (ref 0.350–4.500)

## 2014-04-16 ENCOUNTER — Encounter: Payer: Self-pay | Admitting: Radiology

## 2014-07-05 ENCOUNTER — Ambulatory Visit (INDEPENDENT_AMBULATORY_CARE_PROVIDER_SITE_OTHER): Payer: Medicare Other | Admitting: Family Medicine

## 2014-07-05 VITALS — BP 110/64 | HR 78 | Temp 98.7°F | Resp 18 | Ht 66.0 in | Wt 160.4 lb

## 2014-07-05 DIAGNOSIS — M79671 Pain in right foot: Secondary | ICD-10-CM

## 2014-07-05 DIAGNOSIS — I2510 Atherosclerotic heart disease of native coronary artery without angina pectoris: Secondary | ICD-10-CM

## 2014-07-05 DIAGNOSIS — R21 Rash and other nonspecific skin eruption: Secondary | ICD-10-CM

## 2014-07-05 DIAGNOSIS — M79609 Pain in unspecified limb: Secondary | ICD-10-CM | POA: Diagnosis not present

## 2014-07-05 DIAGNOSIS — L255 Unspecified contact dermatitis due to plants, except food: Secondary | ICD-10-CM | POA: Diagnosis not present

## 2014-07-05 DIAGNOSIS — L237 Allergic contact dermatitis due to plants, except food: Secondary | ICD-10-CM

## 2014-07-05 MED ORDER — VALACYCLOVIR HCL 1 G PO TABS: 1000.0000 mg | ORAL_TABLET | Freq: Three times a day (TID) | ORAL | Status: DC

## 2014-07-05 MED ORDER — TRIAMCINOLONE ACETONIDE 0.1 % EX CREA: 1.0000 "application " | TOPICAL_CREAM | Freq: Two times a day (BID) | CUTANEOUS | Status: DC

## 2014-07-05 NOTE — Progress Notes (Signed)
Subjective:  This chart was scribed for Doris Ray, MD by Mercy Moore, Medial Scribe. This patient was seen in room 3 and the patient's care was started at 4:42 PM.    Patient ID: [name], female    DOB: 1946-11-23, 68 y.o.   MRN: [id number]  HPI HPI Comments: Doris Wilkerson is a 68 y.o. female who presents to the [facility] complaining of erythematous, mildly pruritic, painful rash onset three days ago after volunteering in [place] [employer] both last week and three days ago. Rash primarily to upper right arm with small patches at her ankles and lower legs. Patient reports pain but denies significant itching. She denies scratching even if she feels the urge. Patient reports treatment with Benadryl. She denies associated fever, chills, trouble breathing, difficulty swallowing or sore throat.  She denies tick bites. Shingles vaccine in 10/2008.   Fifth metatarsal pain Patient has previous history of fifth metatarsal fracture in 10/2012. Eventually sent at ortho and had resolution. Patient reports recently digging with foot shovel in her garden. After the activity patient reports subsequent pain which has since resolved.   Patient Active Problem List   Diagnosis Date Noted  . Pain in joint, ankle and foot 02/14/2013  . Foot fracture 02/14/2013  . CAD (coronary artery disease) 12/09/2011  . Dyslipidemia 12/09/2011  . Osteopenia 12/09/2011  . Hypothyroid 12/09/2011  . Asthma 12/09/2011   Past Medical History  Diagnosis Date  . Thyroid disease   . Heart attack    Past Surgical History  Procedure Laterality Date  . Tonsillectomy  1951  . Orthopedic surgery  2012  . Eye surgery     Allergies  Allergen Reactions  . Plavix [Clopidogrel Bisulfate]    Prior to Admission medications   Medication Sig Start Date End Date Taking? Authorizing Provider  aspirin 81 MG tablet Take 81 mg by mouth daily.    Yes Hayden Rasmussen, MD  atorvastatin (LIPITOR) 40 MG tablet  Take 1 tablet (40 mg total) by mouth daily. 04/10/14  Yes [name], MD  benazepril (LOTENSIN) 10 MG tablet Take 1 tablet (10 mg total) by mouth daily. 04/10/14  Yes [name], MD  levothyroxine (SYNTHROID, LEVOTHROID) 25 MCG tablet TAKE 1 TABLET (25 MCG TOTAL) BY MOUTH DAILY. 04/10/14  Yes [name], MD  metoprolol tartrate (LOPRESSOR) 25 MG tablet Take 1 tablet (25 mg total) by mouth daily. 04/10/14  Yes [name], MD  OVER THE COUNTER MEDICATION Vitamin D 2000iu daily.   Yes Historical Provider, MD  doxycycline (VIBRA-TABS) 100 MG tablet Take 1 tablet (100 mg total) by mouth 2 (two) times daily. 11/19/12   [name], MD  triamcinolone cream (KENALOG) 0.1 % Apply topically 2 (two) times daily. 07/03/13   [name], MD   History   Social History  . Marital Status: Married    Spouse Name: N/A    Number of Children: N/A  . Years of Education: N/A   Occupational History  . unemployed    Social History Main Topics  . Smoking status: Never Smoker   . Smokeless tobacco: Not on file  . Alcohol Use: 1.0 oz/week    2 drink(s) per week     Comment: 1  . Drug Use: No  . Sexual Activity: Not on file   Other Topics Concern  . Not on file   Social History Narrative   Married. Education: college. Pt does exercise-  Review of Systems  Constitutional: Negative for fever and chills.  HENT: Negative for sore throat and trouble swallowing.   Respiratory: Negative for shortness of breath.   Cardiovascular: Negative for chest pain.  Musculoskeletal: Negative for myalgias.  Skin: Positive for color change and rash.       Objective:   Physical Exam  Nursing note and vitals reviewed. Constitutional: She is oriented to person, place, and time. She appears well-developed and well-nourished.  HENT:  Head: Normocephalic and atraumatic.  Eyes: EOM are normal.  Neck: Neck supple.  Cardiovascular: Normal rate.   Pulmonary/Chest: Effort normal.    Musculoskeletal: Normal range of motion.  Mild tenderness along fifth metatarsal.  No ecchymosis or no swelling. Only tender with palpation.   Neurological: She is alert and oriented to person, place, and time.  Skin: Skin is warm and dry. Rash noted. There is erythema.  Right arm: from mid dorsal shoulder to dorsal and lateral arm as well as just at the medial aspect of the antecubital fossa she has clear vesicular lesions with surrounding erythema. Largest amount of erythema at antecubital area approximately 1-1.5 cm. They all have dew drops on rose petal appearance. Small distal abrasions in ? Ulnar aspect of forearm few vesicular lesions.  Psychiatric: She has a normal mood and affect. Her behavior is normal.     Filed Vitals:   07/05/14 1608  BP: 110/64  Pulse: 78  Temp: 98.7 F (37.1 C)  TempSrc: Oral  Resp: 18  Height: 5\' 6"  (1.676 m)  Weight: 160 lb 6.4 oz (72.757 kg)  SpO2: 98%        Assessment & Plan:   [name] is a 68 y.o. female Rash and nonspecific skin eruption - Contact dermatitis due to poison ivy - Plan: triamcinolone cream (KENALOG) 0.1 %, valACYclovir (VALTREX) 1000 MG tablet, Viral culture  - could be poison ivy or other contact derm, but appears vesicular and overall dermatomal. Also painful more than itching.   - start Valtrex, contact precautions and topical kenalog if needed. rtc precautions given.   Right foot pain  -NKI, but may have some recurrence of pain in area of prior fx with overuse. If pain persists - return for repeat eval and XR.   Meds ordered this encounter  Medications  . valACYclovir (VALTREX) 1000 MG tablet    Sig: Take 1 tablet (1,000 mg total) by mouth 3 (three) times daily.    Dispense:  21 tablet    Refill:  0  . triamcinolone cream (KENALOG) 0.1 %    Sig: Apply 1 application topically 2 (two) times daily.    Dispense:  30 g    Refill:  0   Patient Instructions  Start the valtrex for possible shingles, but also  possible poison ivy. If the area is itching you can use the steroid cream twice per day. If any spread of redness or worsening symptoms return for recheck. See other information below.   If your foot pain does not continue to improve in next 4-5 days return for recheck and possible X-rays.   Return to the clinic or go to the nearest emergency room if any of your symptoms worsen or new symptoms occur.   Shingles Shingles (herpes zoster) is an infection that is caused by the same virus that causes chickenpox (varicella). The infection causes a painful skin rash and fluid-filled blisters, which eventually break open, crust over, and heal. It may occur in any area of the body, but  it usually affects only one side of the body or face. The pain of shingles usually lasts about 1 month. However, some people with shingles may develop long-term (chronic) pain in the affected area of the body. Shingles often occurs many years after the person had chickenpox. It is more common:  In people older than 50 years.  In people with weakened immune systems, such as those with HIV, AIDS, or cancer.  In people taking medicines that weaken the immune system, such as transplant medicines.  In people under great stress. CAUSES  Shingles is caused by the varicella zoster virus (VZV), which also causes chickenpox. After a person is infected with the virus, it can remain in the person's body for years in an inactive state (dormant). To cause shingles, the virus reactivates and breaks out as an infection in a nerve root. The virus can be spread from person to person (contagious) through contact with open blisters of the shingles rash. It will only spread to people who have not had chickenpox. When these people are exposed to the virus, they may develop chickenpox. They will not develop shingles. Once the blisters scab over, the person is no longer contagious and cannot spread the virus to others. SIGNS AND SYMPTOMS    Shingles shows up in stages. The initial symptoms may be pain, itching, and tingling in an area of the skin. This pain is usually described as burning, stabbing, or throbbing.In a few days or weeks, a painful red rash will appear in the area where the pain, itching, and tingling were felt. The rash is usually on one side of the body in a band or belt-like pattern. Then, the rash usually turns into fluid-filled blisters. They will scab over and dry up in approximately 2-3 weeks. Flu-like symptoms may also occur with the initial symptoms, the rash, or the blisters. These may include:  Fever.  Chills.  Headache.  Upset stomach. DIAGNOSIS  Your health care provider will perform a skin exam to diagnose shingles. Skin scrapings or fluid samples may also be taken from the blisters. This sample will be examined under a microscope or sent to a lab for further testing. TREATMENT  There is no specific cure for shingles. Your health care provider will likely prescribe medicines to help you manage the pain, recover faster, and avoid long-term problems. This may include antiviral drugs, anti-inflammatory drugs, and pain medicines. HOME CARE INSTRUCTIONS   Take a cool bath or apply cool compresses to the area of the rash or blisters as directed. This may help with the pain and itching.   Take medicines only as directed by your health care provider.   Rest as directed by your health care provider.  Keep your rash and blisters clean with mild soap and cool water or as directed by your health care provider.  Do not pick your blisters or scratch your rash. Apply an anti-itch cream or numbing creams to the affected area as directed by your health care provider.  Keep your shingles rash covered with a loose bandage (dressing).  Avoid skin contact with:  Babies.   Pregnant women.   Children with eczema.   Elderly people with transplants.   People with chronic illnesses, such as leukemia or  AIDS.   Wear loose-fitting clothing to help ease the pain of material rubbing against the rash.  Keep all follow-up visits as directed by your health care provider.If the area involved is on your face, you may receive a referral for a  specialist, such as an eye doctor (ophthalmologist) or an ear, nose, and throat (ENT) doctor. Keeping all follow-up visits will help you avoid eye problems, chronic pain, or disability.  SEEK IMMEDIATE MEDICAL CARE IF:   You have facial pain, pain around the eye area, or loss of feeling on one side of your face.  You have ear pain or ringing in your ear.  You have loss of taste.  Your pain is not relieved with prescribed medicines.   Your redness or swelling spreads.   You have more pain and swelling.  Your condition is worsening or has changed.   You have a fever. MAKE SURE YOU:  Understand these instructions.  Will watch your condition.  Will get help right away if you are not doing well or get worse. Document Released: 11/10/2005 Document Revised: 03/27/2014 Document Reviewed: 06/24/2012 [facility] Patient Information 2015 [place], [place]. This information is not intended to replace advice given to you by your health care provider. Make sure you discuss any questions you have with your health care provider.   Poison [employer] ivy is a inflammation of the skin (contact dermatitis) caused by touching the allergens on the leaves of the ivy plant following previous exposure to the plant. The rash usually appears 48 hours after exposure. The rash is usually bumps (papules) or blisters (vesicles) in a linear pattern. Depending on your own sensitivity, the rash may simply cause redness and itching, or it may also progress to blisters which may break open. These must be well cared for to prevent secondary bacterial (germ) infection, followed by scarring. Keep any open areas dry, clean, dressed, and covered with an antibacterial ointment if needed. The  eyes may also get puffy. The puffiness is worst in the morning and gets better as the day progresses. This dermatitis usually heals without scarring, within 2 to 3 weeks without treatment. HOME CARE INSTRUCTIONS  Thoroughly wash with soap and water as soon as you have been exposed to poison ivy. You have about one half hour to remove the plant resin before it will cause the rash. This washing will destroy the oil or antigen on the skin that is causing, or will cause, the rash. Be sure to wash under your fingernails as any plant resin there will continue to spread the rash. Do not rub skin vigorously when washing affected area. Poison ivy cannot spread if no oil from the plant remains on your body. A rash that has progressed to weeping sores will not spread the rash unless you have not washed thoroughly. It is also important to wash any clothes you have been wearing as these may carry active allergens. The rash will return if you wear the unwashed clothing, even several days later. Avoidance of the plant in the future is the best measure. Poison ivy plant can be recognized by the number of leaves. Generally, poison ivy has three leaves with flowering branches on a single stem. Diphenhydramine may be purchased over the counter and used as needed for itching. Do not drive with this medication if it makes you drowsy.Ask your caregiver about medication for children. SEEK MEDICAL CARE IF:  Open sores develop.  Redness spreads beyond area of rash.  You notice purulent (pus-like) discharge.  You have increased pain.  Other signs of infection develop (such as fever). Document Released: 11/07/2000 Document Revised: 02/02/2012 Document Reviewed: 04/20/2009 [facility] Patient Information 2015 [place], [place]. This information is not intended to replace advice given to you by your health care  provider. Make sure you discuss any questions you have with your health care provider.     I personally performed the  services described in this documentation, which was scribed in my presence. The recorded information has been reviewed and considered, and addended by me as needed.

## 2014-07-05 NOTE — Patient Instructions (Signed)
Start the valtrex for possible shingles, but also possible poison ivy. If the area is itching you can use the steroid cream twice per day. If any spread of redness or worsening symptoms return for recheck. See other information below.   If your foot pain does not continue to improve in next 4-5 days return for recheck and possible X-rays.   Return to the clinic or go to the nearest emergency room if any of your symptoms worsen or new symptoms occur.   Shingles Shingles (herpes zoster) is an infection that is caused by the same virus that causes chickenpox (varicella). The infection causes a painful skin rash and fluid-filled blisters, which eventually break open, crust over, and heal. It may occur in any area of the body, but it usually affects only one side of the body or face. The pain of shingles usually lasts about 1 month. However, some people with shingles may develop long-term (chronic) pain in the affected area of the body. Shingles often occurs many years after the person had chickenpox. It is more common:  In people older than 50 years.  In people with weakened immune systems, such as those with HIV, AIDS, or cancer.  In people taking medicines that weaken the immune system, such as transplant medicines.  In people under great stress. CAUSES  Shingles is caused by the varicella zoster virus (VZV), which also causes chickenpox. After a person is infected with the virus, it can remain in the person's body for years in an inactive state (dormant). To cause shingles, the virus reactivates and breaks out as an infection in a nerve root. The virus can be spread from person to person (contagious) through contact with open blisters of the shingles rash. It will only spread to people who have not had chickenpox. When these people are exposed to the virus, they may develop chickenpox. They will not develop shingles. Once the blisters scab over, the person is no longer contagious and cannot spread  the virus to others. SIGNS AND SYMPTOMS  Shingles shows up in stages. The initial symptoms may be pain, itching, and tingling in an area of the skin. This pain is usually described as burning, stabbing, or throbbing.In a few days or weeks, a painful red rash will appear in the area where the pain, itching, and tingling were felt. The rash is usually on one side of the body in a band or belt-like pattern. Then, the rash usually turns into fluid-filled blisters. They will scab over and dry up in approximately 2-3 weeks. Flu-like symptoms may also occur with the initial symptoms, the rash, or the blisters. These may include:  Fever.  Chills.  Headache.  Upset stomach. DIAGNOSIS  Your health care Adith Tejada will perform a skin exam to diagnose shingles. Skin scrapings or fluid samples may also be taken from the blisters. This sample will be examined under a microscope or sent to a lab for further testing. TREATMENT  There is no specific cure for shingles. Your health care Lam Bjorklund will likely prescribe medicines to help you manage the pain, recover faster, and avoid long-term problems. This may include antiviral drugs, anti-inflammatory drugs, and pain medicines. HOME CARE INSTRUCTIONS   Take a cool bath or apply cool compresses to the area of the rash or blisters as directed. This may help with the pain and itching.   Take medicines only as directed by your health care Ramir Malerba.   Rest as directed by your health care Shakiya Mcneary.  Keep your rash and  blisters clean with mild soap and cool water or as directed by your health care Jerelle Virden.  Do not pick your blisters or scratch your rash. Apply an anti-itch cream or numbing creams to the affected area as directed by your health care Chevette Fee.  Keep your shingles rash covered with a loose bandage (dressing).  Avoid skin contact with:  Babies.   Pregnant women.   Children with eczema.   Elderly people with transplants.   People  with chronic illnesses, such as leukemia or AIDS.   Wear loose-fitting clothing to help ease the pain of material rubbing against the rash.  Keep all follow-up visits as directed by your health care Somara Frymire.If the area involved is on your face, you may receive a referral for a specialist, such as an eye doctor (ophthalmologist) or an ear, nose, and throat (ENT) doctor. Keeping all follow-up visits will help you avoid eye problems, chronic pain, or disability.  SEEK IMMEDIATE MEDICAL CARE IF:   You have facial pain, pain around the eye area, or loss of feeling on one side of your face.  You have ear pain or ringing in your ear.  You have loss of taste.  Your pain is not relieved with prescribed medicines.   Your redness or swelling spreads.   You have more pain and swelling.  Your condition is worsening or has changed.   You have a fever. MAKE SURE YOU:  Understand these instructions.  Will watch your condition.  Will get help right away if you are not doing well or get worse. Document Released: 11/10/2005 Document Revised: 03/27/2014 Document Reviewed: 06/24/2012 [facility] Patient Information 2015 [place], [place]. This information is not intended to replace advice given to you by your health care Carolin Quang. Make sure you discuss any questions you have with your health care Toribio Seiber.   Poison [employer] ivy is a inflammation of the skin (contact dermatitis) caused by touching the allergens on the leaves of the ivy plant following previous exposure to the plant. The rash usually appears 48 hours after exposure. The rash is usually bumps (papules) or blisters (vesicles) in a linear pattern. Depending on your own sensitivity, the rash may simply cause redness and itching, or it may also progress to blisters which may break open. These must be well cared for to prevent secondary bacterial (germ) infection, followed by scarring. Keep any open areas dry, clean, dressed, and covered  with an antibacterial ointment if needed. The eyes may also get puffy. The puffiness is worst in the morning and gets better as the day progresses. This dermatitis usually heals without scarring, within 2 to 3 weeks without treatment. HOME CARE INSTRUCTIONS  Thoroughly wash with soap and water as soon as you have been exposed to poison ivy. You have about one half hour to remove the plant resin before it will cause the rash. This washing will destroy the oil or antigen on the skin that is causing, or will cause, the rash. Be sure to wash under your fingernails as any plant resin there will continue to spread the rash. Do not rub skin vigorously when washing affected area. Poison ivy cannot spread if no oil from the plant remains on your body. A rash that has progressed to weeping sores will not spread the rash unless you have not washed thoroughly. It is also important to wash any clothes you have been wearing as these may carry active allergens. The rash will return if you wear the unwashed clothing, even several days  later. Avoidance of the plant in the future is the best measure. Poison ivy plant can be recognized by the number of leaves. Generally, poison ivy has three leaves with flowering branches on a single stem. Diphenhydramine may be purchased over the counter and used as needed for itching. Do not drive with this medication if it makes you drowsy.Ask your caregiver about medication for children. SEEK MEDICAL CARE IF:  Open sores develop.  Redness spreads beyond area of rash.  You notice purulent (pus-like) discharge.  You have increased pain.  Other signs of infection develop (such as fever). Document Released: 11/07/2000 Document Revised: 02/02/2012 Document Reviewed: 04/20/2009 [facility] Patient Information 2015 [place], [place]. This information is not intended to replace advice given to you by your health care Scout Gumbs. Make sure you discuss any questions you have with your health care  Mattthew Ziomek.

## 2014-07-17 LAB — VIRAL CULTURE VIRC: Organism ID, Bacteria: NEGATIVE

## 2014-07-29 ENCOUNTER — Other Ambulatory Visit: Payer: Self-pay | Admitting: Family Medicine

## 2014-10-16 ENCOUNTER — Ambulatory Visit: Payer: Medicare Other

## 2014-10-16 ENCOUNTER — Ambulatory Visit (INDEPENDENT_AMBULATORY_CARE_PROVIDER_SITE_OTHER): Payer: Medicare Other

## 2014-10-16 DIAGNOSIS — Z23 Encounter for immunization: Secondary | ICD-10-CM

## 2014-10-25 ENCOUNTER — Other Ambulatory Visit: Payer: Self-pay | Admitting: Family Medicine

## 2014-10-25 DIAGNOSIS — E780 Pure hypercholesterolemia: Secondary | ICD-10-CM | POA: Diagnosis not present

## 2014-10-25 DIAGNOSIS — I2510 Atherosclerotic heart disease of native coronary artery without angina pectoris: Secondary | ICD-10-CM | POA: Diagnosis not present

## 2014-10-25 DIAGNOSIS — I10 Essential (primary) hypertension: Secondary | ICD-10-CM | POA: Diagnosis not present

## 2014-10-29 ENCOUNTER — Encounter: Payer: Self-pay | Admitting: Family Medicine

## 2014-10-29 ENCOUNTER — Other Ambulatory Visit: Payer: Self-pay | Admitting: Physician Assistant

## 2014-10-30 NOTE — Telephone Encounter (Signed)
Patient called to check the status of getting 4 medications refilled. States that [facility] will not accept them as they are written and they will need to be sent to [facility]. Also, an MD has to sign off on them. Please call patient when more information is available.   [phone]

## 2014-10-30 NOTE — Telephone Encounter (Signed)
Dr [name], see notes under other (more recent) email from 10/29/14. I will close this one.

## 2014-10-30 NOTE — Telephone Encounter (Signed)
Dr [name], Rx had been sent for 30 days only per protocol bc pt is due for f/up. Do you want to OK 90 day supplies of each, appt 11/27/14. Pended to local [facility] per this most recent email.

## 2014-10-31 MED ORDER — BENAZEPRIL HCL 10 MG PO TABS: 10.0000 mg | ORAL_TABLET | Freq: Every day | ORAL | Status: DC

## 2014-10-31 MED ORDER — BENAZEPRIL HCL 10 MG PO TABS
10.0000 mg | ORAL_TABLET | Freq: Every day | ORAL | Status: DC
Start: 2014-10-31 — End: 2014-11-27

## 2014-10-31 MED ORDER — METOPROLOL TARTRATE 25 MG PO TABS: 25.0000 mg | ORAL_TABLET | Freq: Every day | ORAL | Status: DC

## 2014-10-31 MED ORDER — LEVOTHYROXINE SODIUM 25 MCG PO TABS: 25.0000 ug | ORAL_TABLET | Freq: Every day | ORAL | Status: DC

## 2014-10-31 MED ORDER — ATORVASTATIN CALCIUM 40 MG PO TABS: 40.0000 mg | ORAL_TABLET | Freq: Every day | ORAL | Status: DC

## 2014-10-31 NOTE — Telephone Encounter (Signed)
#  30 was sent to her [place] as requested. LM for pt to call and schedule a follow up appt with [name] before this supply runs out.  Rtn call if any questions.

## 2014-10-31 NOTE — Telephone Encounter (Signed)
Called and cancelled the 30 day refills and notified pt that this was done.

## 2014-10-31 NOTE — Telephone Encounter (Signed)
90 day supply ordered. Please call and cancel 30 day supply.

## 2014-11-05 ENCOUNTER — Other Ambulatory Visit: Payer: Self-pay | Admitting: Physician Assistant

## 2014-11-06 ENCOUNTER — Encounter: Payer: Self-pay | Admitting: Family Medicine

## 2014-11-06 NOTE — Telephone Encounter (Signed)
See pt email from 11/06/14. I am denying these 30 day RF reqs from [facility] bc pt reports in email that they have already sent her out a 30 day of ea from [place]. LMOM for pt to CB concerning her email. When pt comes into her appt, Dr [name] should be able to order her Rxs in 90 day supplies from [facility] w/RFs. If order doesn't arrive today, pt could consider buying a few tablets of ea med  O[facility] from the local pharm to hold her over until shipment arrives.

## 2014-11-06 NOTE — Telephone Encounter (Signed)
Pt CB and left me a VM that her mail hasn't arrived yet, so doesn't know if meds will arrive. I left her message suggesting she ask about paying OOP for a few tablets of meds she is out of if not too costly.

## 2014-11-07 ENCOUNTER — Telehealth: Payer: Self-pay

## 2014-11-07 NOTE — Telephone Encounter (Signed)
-----   Message from [name], MD sent at 11/07/2014  3:17 PM EST ----- See email - routed to Rx refill pool, but need this checked on today. Thanks. -JG

## 2014-11-07 NOTE — Telephone Encounter (Signed)
Dr [name], I spoke with pt again today (see documentation of yesterday's calls to her on 12/13 "Refill req" encounter), and she reported that she DID get her medication yesterday and is happily on vacation. I had sent in the 90 day locally last week when she sent the email AND called to cancel the 30 day RFs, but [facility] sent it out anyway. Pt also reported that she tried to cancel the order from [facility] also and there is something wrong w/their system and they can not access their orders that are being processed. Anyway, the problem was that ins won't pay for the local 90 day supplies we sent last week because they already paid for the 30 days from [facility]. Pt has what she needs so all is well now.

## 2014-11-08 NOTE — Telephone Encounter (Signed)
This has been done. See notes under 12/14 pt email and 12/13 refill encounter.

## 2014-11-27 ENCOUNTER — Encounter: Payer: Self-pay | Admitting: Family Medicine

## 2014-11-27 ENCOUNTER — Ambulatory Visit (INDEPENDENT_AMBULATORY_CARE_PROVIDER_SITE_OTHER): Payer: Medicare Other | Admitting: Family Medicine

## 2014-11-27 VITALS — BP 138/78 | HR 62 | Temp 97.9°F | Resp 16 | Ht 66.5 in | Wt 161.6 lb

## 2014-11-27 DIAGNOSIS — E785 Hyperlipidemia, unspecified: Secondary | ICD-10-CM

## 2014-11-27 DIAGNOSIS — L918 Other hypertrophic disorders of the skin: Secondary | ICD-10-CM | POA: Diagnosis not present

## 2014-11-27 DIAGNOSIS — I10 Essential (primary) hypertension: Secondary | ICD-10-CM | POA: Diagnosis not present

## 2014-11-27 DIAGNOSIS — E039 Hypothyroidism, unspecified: Secondary | ICD-10-CM

## 2014-11-27 MED ORDER — METOPROLOL TARTRATE 25 MG PO TABS: 12.5000 mg | ORAL_TABLET | Freq: Two times a day (BID) | ORAL | Status: DC

## 2014-11-27 MED ORDER — ATORVASTATIN CALCIUM 40 MG PO TABS: 40.0000 mg | ORAL_TABLET | Freq: Every day | ORAL | Status: DC

## 2014-11-27 MED ORDER — BENAZEPRIL HCL 10 MG PO TABS: 10.0000 mg | ORAL_TABLET | Freq: Every day | ORAL | Status: DC

## 2014-11-27 MED ORDER — LEVOTHYROXINE SODIUM 25 MCG PO TABS: 25.0000 ug | ORAL_TABLET | Freq: Every day | ORAL | Status: DC

## 2014-11-27 NOTE — Patient Instructions (Signed)
meds refilled for 6 months. Plan on physical prior to end of refill. If any concerns on the biopsy/skin tag I will let you know. Return to the clinic or go to the nearest emergency room if any of your symptoms worsen or new symptoms occur.

## 2014-11-27 NOTE — Progress Notes (Signed)
Subjective:    Patient ID: Doris Wilkerson, female    DOB: 13-Jan-1946, 69 y.o.   MRN: [id number] This chart was scribed for [name], MD by Zola Button, Medical Scribe. This patient was seen in Room 29 and the patient's care was started at 4:02 PM.    HPI HPI Comments: Doris Wilkerson is a 69 y.o. female who presents to the [facility] complaining of a mole on her neck. See multiple telephone notes and emails from December.  CAD and HTN: Patient is here for medication refills. She takes metoprolol and Lotensin. She is also on ASA 81 mg. Her CAD is followed by Dr. Einar Gip. Her last lipid panel and CMP were normal in May 2015. TSH also normal in May. Patient last saw Dr. Einar Gip at the end of November; everything was fine then.  Hypothyroidism: Last TSH was normal, May 2015. She takes Sythroid 25 mcg QD. Lab Results  Component Value Date   TSH 2.540 04/10/2014   Neck: She reports having a skin tag on her right posterior neck that she would like removed.   Patient Active Problem List   Diagnosis Date Noted  . Pain in joint, ankle and foot 02/14/2013  . Foot fracture 02/14/2013  . CAD (coronary artery disease) 12/09/2011  . Dyslipidemia 12/09/2011  . Osteopenia 12/09/2011  . Hypothyroid 12/09/2011  . Asthma 12/09/2011   Past Medical History  Diagnosis Date  . Thyroid disease   . Heart attack    Past Surgical History  Procedure Laterality Date  . Tonsillectomy  1951  . Orthopedic surgery  2012  . Eye surgery     Allergies  Allergen Reactions  . Plavix [Clopidogrel Bisulfate]    Prior to Admission medications   Medication Sig Start Date End Date Taking? Authorizing Provider  aspirin 81 MG tablet Take 81 mg by mouth daily.     Hayden Rasmussen, MD  atorvastatin (LIPITOR) 40 MG tablet Take 1 tablet (40 mg total) by mouth daily. PATIENT NEEDS CHECK UP FOR ADDITIONAL REFILLS 10/31/14   [name], MD  atorvastatin (LIPITOR) 40 MG tablet Take 1 tablet (40 mg  total) by mouth daily. PATIENT NEEDS CHECK UP FOR ADDITIONAL REFILLS 10/31/14   Mancel Bale, PA-C  benazepril (LOTENSIN) 10 MG tablet Take 1 tablet (10 mg total) by mouth daily. PATIENT NEEDS CHECK UP FOR ADDITIONAL REFILLS 10/31/14   [name], MD  benazepril (LOTENSIN) 10 MG tablet Take 1 tablet (10 mg total) by mouth daily. PATIENT NEEDS CHECK UP FOR ADDITIONAL REFILLS 10/31/14   Mancel Bale, PA-C  doxycycline (VIBRA-TABS) 100 MG tablet Take 1 tablet (100 mg total) by mouth 2 (two) times daily. 11/19/12   [name], MD  levothyroxine (SYNTHROID, LEVOTHROID) 25 MCG tablet Take 1 tablet (25 mcg total) by mouth daily before breakfast. PATIENT NEEDS CHECK UP FOR ADDITIONAL REFILLS 10/31/14   [name], MD  levothyroxine (SYNTHROID, LEVOTHROID) 25 MCG tablet Take 1 tablet (25 mcg total) by mouth daily before breakfast. PATIENT NEEDS CHECK UP FOR ADDITIONAL REFILLS 10/31/14   Mancel Bale, PA-C  metoprolol tartrate (LOPRESSOR) 25 MG tablet Take 1 tablet (25 mg total) by mouth daily. PATIENT NEEDS OFFICE VISIT FOR ADDITIONAL REFILLS 10/31/14   [name], MD  metoprolol tartrate (LOPRESSOR) 25 MG tablet Take 1 tablet (25 mg total) by mouth daily. PATIENT NEEDS OFFICE VISIT FOR ADDITIONAL REFILLS 10/31/14   Mancel Bale, PA-C  OVER  THE COUNTER MEDICATION Vitamin D 2000iu daily.    Historical Provider, MD  triamcinolone cream (KENALOG) 0.1 % Apply 1 application topically 2 (two) times daily. 07/05/14   [name], MD  valACYclovir (VALTREX) 1000 MG tablet Take 1 tablet (1,000 mg total) by mouth 3 (three) times daily. 07/05/14   [name], MD   History   Social History  . Marital Status: Married    Spouse Name: N/A    Number of Children: N/A  . Years of Education: N/A   Occupational History  . unemployed    Social History Main Topics  . Smoking status: Never Smoker   . Smokeless tobacco: Not on file  . Alcohol Use: 1.0 oz/week    2 drink(s) per week      Comment: 1  . Drug Use: No  . Sexual Activity: Not on file   Other Topics Concern  . Not on file   Social History Narrative   Married. Education: college. Pt does exercise-     Review of Systems  Constitutional: Negative for fatigue and unexpected weight change.  Respiratory: Negative for chest tightness and shortness of breath.   Cardiovascular: Negative for chest pain, palpitations and leg swelling.  Gastrointestinal: Negative for abdominal pain and blood in stool.  Neurological: Negative for dizziness, syncope, light-headedness and headaches.       Objective:   Physical Exam  Constitutional: She is oriented to person, place, and time. She appears well-developed and well-nourished.  HENT:  Head: Normocephalic and atraumatic.  Eyes: Conjunctivae and EOM are normal. Pupils are equal, round, and reactive to light.  Neck: Carotid bruit is not present. No thyromegaly present.  No thyromegaly or nodules.  Cardiovascular: Normal rate, regular rhythm, normal heart sounds and intact distal pulses.   Pulmonary/Chest: Effort normal and breath sounds normal.  Abdominal: Soft. She exhibits no pulsatile midline mass. There is no tenderness.  Neurological: She is alert and oriented to person, place, and time.  Skin: Skin is warm and dry. No erythema.  Pedunculated skin tag on the right posterior neck at the area of the collar line. Slightly irritated but no surrounding erythema or color change. Slightly scaled appearance on the top of the skin tag.  Psychiatric: She has a normal mood and affect. Her behavior is normal.  Vitals reviewed.     Filed Vitals:   11/27/14 1534  BP: 138/78  Pulse: 62  Temp: 97.9 F (36.6 C)  TempSrc: Oral  Resp: 16  Height: 5' 6.5" (1.689 m)  Weight: 161 lb 9.6 oz (73.301 kg)  SpO2: 100%       Assessment & Plan:   Doris Wilkerson is a 69 y.o. female Hyperlipidemia - Plan: COMPLETE METABOLIC PANEL WITH GFR, Lipid panel with fasting lab only order.  Refilled meds for 6 months to mail order pharmacy.  If different number needed - can have pharmacy call us. Discussed reasons for approx Q51month labs, but can extend current rx to 9 months if needed.   Essential hypertension - Plan: COMPLETE METABOLIC PANEL WITH GFR, Lipid panel, metoprolol tartrate (LOPRESSOR) 25 MG tablet, benazepril (LOTENSIN) 10 MG tablet  -dose clarified with cardiology on metoprolol - 1/2 tab BID of 25mg . No other changes.   Hypothyroidism, unspecified hypothyroidism type - Plan: TSH. Refilled synthroid   Skin tag - Plan: Dermatology pathology. Removed in office - see procedure note. Path pending. Aftercare/rtc precautions.    Meds ordered this encounter  Medications  . metoprolol tartrate (LOPRESSOR) 25  MG tablet    Sig: Take 0.5 tablets (12.5 mg total) by mouth 2 (two) times daily.    Dispense:  90 tablet    Refill:  1  . benazepril (LOTENSIN) 10 MG tablet    Sig: Take 1 tablet (10 mg total) by mouth daily.    Dispense:  90 tablet    Refill:  1  . atorvastatin (LIPITOR) 40 MG tablet    Sig: Take 1 tablet (40 mg total) by mouth daily.    Dispense:  90 tablet    Refill:  1  . levothyroxine (SYNTHROID, LEVOTHROID) 25 MCG tablet    Sig: Take 1 tablet (25 mcg total) by mouth daily before breakfast.    Dispense:  90 tablet    Refill:  1   Patient Instructions  meds refilled for 6 months. Plan on physical prior to end of refill. If any concerns on the biopsy/skin tag I will let you know. Return to the clinic or go to the nearest emergency room if any of your symptoms worsen or new symptoms occur.      I personally performed the services described in this documentation, which was scribed in my presence. The recorded information has been reviewed and considered, and addended by me as needed.

## 2014-11-27 NOTE — Progress Notes (Signed)
Skin tag removal, right neck. Area cleaned with alcohol swab. 1 cc 2% lido without applied at base of mole. Skin tag removed with scissors. Specimen sent for pathology.   Julieta Gutting, PA-C Physician Assistant-Certified [facility]  11/27/2014 4:42 PM

## 2014-11-28 ENCOUNTER — Ambulatory Visit (INDEPENDENT_AMBULATORY_CARE_PROVIDER_SITE_OTHER): Payer: Medicare Other | Admitting: Family Medicine

## 2014-11-28 DIAGNOSIS — E039 Hypothyroidism, unspecified: Secondary | ICD-10-CM | POA: Diagnosis not present

## 2014-11-28 DIAGNOSIS — E785 Hyperlipidemia, unspecified: Secondary | ICD-10-CM | POA: Diagnosis not present

## 2014-11-28 DIAGNOSIS — I10 Essential (primary) hypertension: Secondary | ICD-10-CM

## 2014-11-28 LAB — COMPLETE METABOLIC PANEL WITH GFR
ALK PHOS: 67 U/L (ref 39–117)
ALT: 20 U/L (ref 0–35)
AST: 23 U/L (ref 0–37)
Albumin: 4.4 g/dL (ref 3.5–5.2)
BILIRUBIN TOTAL: 0.6 mg/dL (ref 0.2–1.2)
BUN: 14 mg/dL (ref 6–23)
CO2: 26 mEq/L (ref 19–32)
Calcium: 9.7 mg/dL (ref 8.4–10.5)
Chloride: 105 mEq/L (ref 96–112)
Creat: 0.69 mg/dL (ref 0.50–1.10)
GFR, Est African American: >89 mL/min
GFR, Est Non African American: >89 mL/min
Glucose, Bld: 90 mg/dL (ref 70–99)
Potassium: 4.8 mEq/L (ref 3.5–5.3)
SODIUM: 141 meq/L (ref 135–145)
Total Protein: 6.5 g/dL (ref 6.0–8.3)

## 2014-11-28 LAB — LIPID PANEL
Cholesterol: 163 mg/dL (ref 0–200)
HDL: 68 mg/dL (ref >39)
LDL Cholesterol: 86 mg/dL (ref 0–99)
TRIGLYCERIDES: 43 mg/dL (ref <150)
Total CHOL/HDL Ratio: 2.4 Ratio
VLDL: 9 mg/dL (ref 0–40)

## 2014-11-28 LAB — TSH: TSH: 3.008 u[IU]/mL (ref 0.350–4.500)

## 2014-11-28 NOTE — Progress Notes (Signed)
   Subjective:    Patient ID: Doris Wilkerson, female    DOB: 1946-08-07, 69 y.o.   MRN: [id number]  HPI  Patient is here for lab work only    Review of Systems     Objective:   Physical Exam        Assessment & Plan:

## 2015-01-23 ENCOUNTER — Ambulatory Visit (INDEPENDENT_AMBULATORY_CARE_PROVIDER_SITE_OTHER): Payer: Medicare Other

## 2015-01-23 ENCOUNTER — Ambulatory Visit (INDEPENDENT_AMBULATORY_CARE_PROVIDER_SITE_OTHER): Payer: Medicare Other | Admitting: Family Medicine

## 2015-01-23 VITALS — BP 118/60 | HR 82 | Temp 97.7°F | Resp 16 | Ht 66.75 in | Wt 165.0 lb

## 2015-01-23 DIAGNOSIS — T148 Other injury of unspecified body region: Secondary | ICD-10-CM | POA: Diagnosis not present

## 2015-01-23 DIAGNOSIS — M25511 Pain in right shoulder: Secondary | ICD-10-CM

## 2015-01-23 DIAGNOSIS — T148XXA Other injury of unspecified body region, initial encounter: Secondary | ICD-10-CM

## 2015-01-23 NOTE — Patient Instructions (Addendum)
[facility] [employer]  Impingement Syndrome, Rotator Cuff, Bursitis with Rehab Impingement syndrome is a condition that involves inflammation of the tendons of the rotator cuff and the subacromial bursa, that causes pain in the shoulder. The rotator cuff consists of four tendons and muscles that control much of the shoulder and upper arm function. The subacromial bursa is a fluid filled sac that helps reduce friction between the rotator cuff and one of the bones of the shoulder (acromion). Impingement syndrome is usually an overuse injury that causes swelling of the bursa (bursitis), swelling of the tendon (tendonitis), and/or a tear of the tendon (strain). Strains are classified into three categories. Grade 1 strains cause pain, but the tendon is not lengthened. Grade 2 strains include a lengthened ligament, due to the ligament being stretched or partially ruptured. With grade 2 strains there is still function, although the function may be decreased. Grade 3 strains include a complete tear of the tendon or muscle, and function is usually impaired. SYMPTOMS   Pain around the shoulder, often at the outer portion of the upper arm.  Pain that gets worse with shoulder function, especially when reaching overhead or lifting.  Sometimes, aching when not using the arm.  Pain that wakes you up at night.  Sometimes, tenderness, swelling, warmth, or redness over the affected area.  Loss of strength.  Limited motion of the shoulder, especially reaching behind the back (to the back pocket or to unhook bra) or across your body.  Crackling sound (crepitation) when moving the arm.  Biceps tendon pain and inflammation (in the front of the shoulder). Worse when bending the elbow or lifting. CAUSES  Impingement syndrome is often an overuse injury, in which chronic (repetitive) motions cause the tendons or bursa to become inflamed. A strain occurs when a force is paced on the tendon or muscle that is greater than it  can withstand. Common mechanisms of injury include: Stress from sudden increase in duration, frequency, or intensity of training.  Direct hit (trauma) to the shoulder.  Aging, erosion of the tendon with normal use.  Bony bump on shoulder (acromial spur). RISK INCREASES WITH:  Contact sports (football, wrestling, boxing).  Throwing sports (baseball, tennis, volleyball).  Weightlifting and bodybuilding.  Heavy labor.  Previous injury to the rotator cuff, including impingement.  Poor shoulder strength and flexibility.  Failure to warm up properly before activity.  Inadequate protective equipment.  Old age.  Bony bump on shoulder (acromial spur). PREVENTION   Warm up and stretch properly before activity.  Allow for adequate recovery between workouts.  Maintain physical fitness:  Strength, flexibility, and endurance.  Cardiovascular fitness.  Learn and use proper exercise technique. PROGNOSIS  If treated properly, impingement syndrome usually goes away within 6 weeks. Sometimes surgery is required.  RELATED COMPLICATIONS   Longer healing time if not properly treated, or if not given enough time to heal.  Recurring symptoms, that result in a chronic condition.  Shoulder stiffness, frozen shoulder, or loss of motion.  Rotator cuff tendon tear.  Recurring symptoms, especially if activity is resumed too soon, with overuse, with a direct blow, or when using poor technique. TREATMENT  Treatment first involves the use of ice and medicine, to reduce pain and inflammation. The use of strengthening and stretching exercises may help reduce pain with activity. These exercises may be performed at home or with a therapist. If non-surgical treatment is unsuccessful after more than 6 months, surgery may be advised. After surgery and rehabilitation, activity is usually possible  in 3 months.  MEDICATION  If pain medicine is needed, nonsteroidal anti-inflammatory medicines (aspirin  and ibuprofen), or other minor pain relievers (acetaminophen), are often advised.  Do not take pain medicine for 7 days before surgery.  Prescription pain relievers may be given, if your caregiver thinks they are needed. Use only as directed and only as much as you need.  Corticosteroid injections may be given by your caregiver. These injections should be reserved for the most serious cases, because they may only be given a certain number of times. HEAT AND COLD  Cold treatment (icing) should be applied for 10 to 15 minutes every 2 to 3 hours for inflammation and pain, and immediately after activity that aggravates your symptoms. Use ice packs or an ice massage.  Heat treatment may be used before performing stretching and strengthening activities prescribed by your caregiver, physical therapist, or athletic trainer. Use a heat pack or a warm water soak. SEEK MEDICAL CARE IF:   Symptoms get worse or do not improve in 4 to 6 weeks, despite treatment.  New, unexplained symptoms develop. (Drugs used in treatment may produce side effects.) EXERCISES  RANGE OF MOTION (ROM) AND STRETCHING EXERCISES - Impingement Syndrome (Rotator Cuff  Tendinitis, Bursitis) These exercises may help you when beginning to rehabilitate your injury. Your symptoms may go away with or without further involvement from your physician, physical therapist or athletic trainer. While completing these exercises, remember:   Restoring tissue flexibility helps normal motion to return to the joints. This allows healthier, less painful movement and activity.  An effective stretch should be held for at least 30 seconds.  A stretch should never be painful. You should only feel a gentle lengthening or release in the stretched tissue. STRETCH - Flexion, Standing  Stand with good posture. With an underhand grip on your right / left hand, and an overhand grip on the opposite hand, grasp a broomstick or cane so that your hands are a  little more than shoulder width apart.  Keeping your right / left elbow straight and shoulder muscles relaxed, push the stick with your opposite hand, to raise your right / left arm in front of your body and then overhead. Raise your arm until you feel a stretch in your right / left shoulder, but before you have increased shoulder pain.  Try to avoid shrugging your right / left shoulder as your arm rises, by keeping your shoulder blade tucked down and toward your mid-back spine. Hold for __________ seconds.  Slowly return to the starting position. Repeat __________ times. Complete this exercise __________ times per day. STRETCH - Abduction, Supine  Lie on your back. With an underhand grip on your right / left hand and an overhand grip on the opposite hand, grasp a broomstick or cane so that your hands are a little more than shoulder width apart.  Keeping your right / left elbow straight and your shoulder muscles relaxed, push the stick with your opposite hand, to raise your right / left arm out to the side of your body and then overhead. Raise your arm until you feel a stretch in your right / left shoulder, but before you have increased shoulder pain.  Try to avoid shrugging your right / left shoulder as your arm rises, by keeping your shoulder blade tucked down and toward your mid-back spine. Hold for __________ seconds.  Slowly return to the starting position. Repeat __________ times. Complete this exercise __________ times per day. ROM - Flexion, Active-Assisted  Lie on your back. You may bend your knees for comfort.  Grasp a broomstick or cane so your hands are about shoulder width apart. Your right / left hand should grip the end of the stick, so that your hand is positioned "thumbs-up," as if you were about to shake hands.  Using your healthy arm to lead, raise your right / left arm overhead, until you feel a gentle stretch in your shoulder. Hold for __________ seconds.  Use the stick  to assist in returning your right / left arm to its starting position. Repeat __________ times. Complete this exercise __________ times per day.  ROM - Internal Rotation, Supine   Lie on your back on a firm surface. Place your right / left elbow about 60 degrees away from your side. Elevate your elbow with a folded towel, so that the elbow and shoulder are the same height.  Using a broomstick or cane and your strong arm, pull your right / left hand toward your body until you feel a gentle stretch, but no increase in your shoulder pain. Keep your shoulder and elbow in place throughout the exercise.  Hold for __________ seconds. Slowly return to the starting position. Repeat __________ times. Complete this exercise __________ times per day. STRETCH - Internal Rotation  Place your right / left hand behind your back, palm up.  Throw a towel or belt over your opposite shoulder. Grasp the towel with your right / left hand.  While keeping an upright posture, gently pull up on the towel, until you feel a stretch in the front of your right / left shoulder.  Avoid shrugging your right / left shoulder as your arm rises, by keeping your shoulder blade tucked down and toward your mid-back spine.  Hold for __________ seconds. Release the stretch, by lowering your healthy hand. Repeat __________ times. Complete this exercise __________ times per day. ROM - Internal Rotation   Using an underhand grip, grasp a stick behind your back with both hands.  While standing upright with good posture, slide the stick up your back until you feel a mild stretch in the front of your shoulder.  Hold for __________ seconds. Slowly return to your starting position. Repeat __________ times. Complete this exercise __________ times per day.  STRETCH - Posterior Shoulder Capsule   Stand or sit with good posture. Grasp your right / left elbow and draw it across your chest, keeping it at the same height as your  shoulder.  Pull your elbow, so your upper arm comes in closer to your chest. Pull until you feel a gentle stretch in the back of your shoulder.  Hold for __________ seconds. Repeat __________ times. Complete this exercise __________ times per day. STRENGTHENING EXERCISES - Impingement Syndrome (Rotator Cuff Tendinitis, Bursitis) These exercises may help you when beginning to rehabilitate your injury. They may resolve your symptoms with or without further involvement from your physician, physical therapist or athletic trainer. While completing these exercises, remember:  Muscles can gain both the endurance and the strength needed for everyday activities through controlled exercises.  Complete these exercises as instructed by your physician, physical therapist or athletic trainer. Increase the resistance and repetitions only as guided.  You may experience muscle soreness or fatigue, but the pain or discomfort you are trying to eliminate should never worsen during these exercises. If this pain does get worse, stop and make sure you are following the directions exactly. If the pain is still present after adjustments, discontinue the exercise  until you can discuss the trouble with your clinician.  During your recovery, avoid activity or exercises which involve actions that place your injured hand or elbow above your head or behind your back or head. These positions stress the tissues which you are trying to heal. STRENGTH - Scapular Depression and Adduction   With good posture, sit on a firm chair. Support your arms in front of you, with pillows, arm rests, or on a table top. Have your elbows in line with the sides of your body.  Gently draw your shoulder blades down and toward your mid-back spine. Gradually increase the tension, without tensing the muscles along the top of your shoulders and the back of your neck.  Hold for __________ seconds. Slowly release the tension and relax your muscles  completely before starting the next repetition.  After you have practiced this exercise, remove the arm support and complete the exercise in standing as well as sitting position. Repeat __________ times. Complete this exercise __________ times per day.  STRENGTH - Shoulder Abductors, Isometric  With good posture, stand or sit about 4-6 inches from a wall, with your right / left side facing the wall.  Bend your right / left elbow. Gently press your right / left elbow into the wall. Increase the pressure gradually, until you are pressing as hard as you can, without shrugging your shoulder or increasing any shoulder discomfort.  Hold for __________ seconds.  Release the tension slowly. Relax your shoulder muscles completely before you begin the next repetition. Repeat __________ times. Complete this exercise __________ times per day.  STRENGTH - External Rotators, Isometric  Keep your right / left elbow at your side and bend it 90 degrees.  Step into a door frame so that the outside of your right / left wrist can press against the door frame without your upper arm leaving your side.  Gently press your right / left wrist into the door frame, as if you were trying to swing the back of your hand away from your stomach. Gradually increase the tension, until you are pressing as hard as you can, without shrugging your shoulder or increasing any shoulder discomfort.  Hold for __________ seconds.  Release the tension slowly. Relax your shoulder muscles completely before you begin the next repetition. Repeat __________ times. Complete this exercise __________ times per day.  STRENGTH - Supraspinatus   Stand or sit with good posture. Grasp a __________ weight, or an exercise band or tubing, so that your hand is "thumbs-up," like you are shaking hands.  Slowly lift your right / left arm in a "V" away from your thigh, diagonally into the space between your side and straight ahead. Lift your hand to  shoulder height or as far as you can, without increasing any shoulder pain. At first, many people do not lift their hands above shoulder height.  Avoid shrugging your right / left shoulder as your arm rises, by keeping your shoulder blade tucked down and toward your mid-back spine.  Hold for __________ seconds. Control the descent of your hand, as you slowly return to your starting position. Repeat __________ times. Complete this exercise __________ times per day.  STRENGTH - External Rotators  Secure a rubber exercise band or tubing to a fixed object (table, pole) so that it is at the same height as your right / left elbow when you are standing or sitting on a firm surface.  Stand or sit so that the secured exercise band is at your  uninjured side.  Bend your right / left elbow 90 degrees. Place a folded towel or small pillow under your right / left arm, so that your elbow is a few inches away from your side.  Keeping the tension on the exercise band, pull it away from your body, as if pivoting on your elbow. Be sure to keep your body steady, so that the movement is coming only from your rotating shoulder.  Hold for __________ seconds. Release the tension in a controlled manner, as you return to the starting position. Repeat __________ times. Complete this exercise __________ times per day.  STRENGTH - Internal Rotators   Secure a rubber exercise band or tubing to a fixed object (table, pole) so that it is at the same height as your right / left elbow when you are standing or sitting on a firm surface.  Stand or sit so that the secured exercise band is at your right / left side.  Bend your elbow 90 degrees. Place a folded towel or small pillow under your right / left arm so that your elbow is a few inches away from your side.  Keeping the tension on the exercise band, pull it across your body, toward your stomach. Be sure to keep your body steady, so that the movement is coming only from  your rotating shoulder.  Hold for __________ seconds. Release the tension in a controlled manner, as you return to the starting position. Repeat __________ times. Complete this exercise __________ times per day.  STRENGTH - Scapular Protractors, Standing   Stand arms length away from a wall. Place your hands on the wall, keeping your elbows straight.  Begin by dropping your shoulder blades down and toward your mid-back spine.  To strengthen your protractors, keep your shoulder blades down, but slide them forward on your rib cage. It will feel as if you are lifting the back of your rib cage away from the wall. This is a subtle motion and can be challenging to complete. Ask your caregiver for further instruction, if you are not sure you are doing the exercise correctly.  Hold for __________ seconds. Slowly return to the starting position, resting the muscles completely before starting the next repetition. Repeat __________ times. Complete this exercise __________ times per day. STRENGTH - Scapular Protractors, Supine  Lie on your back on a firm surface. Extend your right / left arm straight into the air while holding a __________ weight in your hand.  Keeping your head and back in place, lift your shoulder off the floor.  Hold for __________ seconds. Slowly return to the starting position, and allow your muscles to relax completely before starting the next repetition. Repeat __________ times. Complete this exercise __________ times per day. STRENGTH - Scapular Protractors, Quadruped  Get onto your hands and knees, with your shoulders directly over your hands (or as close as you can be, comfortably).  Keeping your elbows locked, lift the back of your rib cage up into your shoulder blades, so your mid-back rounds out. Keep your neck muscles relaxed.  Hold this position for __________ seconds. Slowly return to the starting position and allow your muscles to relax completely before starting the  next repetition. Repeat __________ times. Complete this exercise __________ times per day.  STRENGTH - Scapular Retractors  Secure a rubber exercise band or tubing to a fixed object (table, pole), so that it is at the height of your shoulders when you are either standing, or sitting on a firm armless  chair.  With a palm down grip, grasp an end of the band in each hand. Straighten your elbows and lift your hands straight in front of you, at shoulder height. Step back, away from the secured end of the band, until it becomes tense.  Squeezing your shoulder blades together, draw your elbows back toward your sides, as you bend them. Keep your upper arms lifted away from your body throughout the exercise.  Hold for __________ seconds. Slowly ease the tension on the band, as you reverse the directions and return to the starting position. Repeat __________ times. Complete this exercise __________ times per day. STRENGTH - Shoulder Extensors   Secure a rubber exercise band or tubing to a fixed object (table, pole) so that it is at the height of your shoulders when you are either standing, or sitting on a firm armless chair.  With a thumbs-up grip, grasp an end of the band in each hand. Straighten your elbows and lift your hands straight in front of you, at shoulder height. Step back, away from the secured end of the band, until it becomes tense.  Squeezing your shoulder blades together, pull your hands down to the sides of your thighs. Do not allow your hands to go behind you.  Hold for __________ seconds. Slowly ease the tension on the band, as you reverse the directions and return to the starting position. Repeat __________ times. Complete this exercise __________ times per day.  STRENGTH - Scapular Retractors and External Rotators   Secure a rubber exercise band or tubing to a fixed object (table, pole) so that it is at the height as your shoulders, when you are either standing, or sitting on a  firm armless chair.  With a palm down grip, grasp an end of the band in each hand. Bend your elbows 90 degrees and lift your elbows to shoulder height, at your sides. Step back, away from the secured end of the band, until it becomes tense.  Squeezing your shoulder blades together, rotate your shoulders so that your upper arms and elbows remain stationary, but your fists travel upward to head height.  Hold for __________ seconds. Slowly ease the tension on the band, as you reverse the directions and return to the starting position. Repeat __________ times. Complete this exercise __________ times per day.  STRENGTH - Scapular Retractors and External Rotators, Rowing   Secure a rubber exercise band or tubing to a fixed object (table, pole) so that it is at the height of your shoulders, when you are either standing, or sitting on a firm armless chair.  With a palm down grip, grasp an end of the band in each hand. Straighten your elbows and lift your hands straight in front of you, at shoulder height. Step back, away from the secured end of the band, until it becomes tense.  Step 1: Squeeze your shoulder blades together. Bending your elbows, draw your hands to your chest, as if you are rowing a boat. At the end of this motion, your hands and elbow should be at shoulder height and your elbows should be out to your sides.  Step 2: Rotate your shoulders, to raise your hands above your head. Your forearms should be vertical and your upper arms should be horizontal.  Hold for __________ seconds. Slowly ease the tension on the band, as you reverse the directions and return to the starting position. Repeat __________ times. Complete this exercise __________ times per day.  STRENGTH - Scapular Depressors  Find a sturdy chair without wheels, such as a dining room chair.  Keeping your feet on the floor, and your hands on the chair arms, lift your bottom up from the seat, and lock your elbows.  Keeping  your elbows straight, allow gravity to pull your body weight down. Your shoulders will rise toward your ears.  Raise your body against gravity by drawing your shoulder blades down your back, shortening the distance between your shoulders and ears. Although your feet should always maintain contact with the floor, your feet should progressively support less body weight, as you get stronger.  Hold for __________ seconds. In a controlled and slow manner, lower your body weight to begin the next repetition. Repeat __________ times. Complete this exercise __________ times per day.  Document Released: 11/10/2005 Document Revised: 02/02/2012 Document Reviewed: 02/22/2009 [facility] Patient Information 2015 [place], [place]. This information is not intended to replace advice given to you by your health care provider. Make sure you discuss any questions you have with your health care provider.

## 2015-01-23 NOTE — Progress Notes (Signed)
 Chief Complaint:  Chief Complaint  Patient presents with  . Shoulder Pain    Right, X 1 month    HPI: Doris Wilkerson is a 69 y.o. female who is here for right shoulder pain specifically along her right clavicle on the lateral aspect and also her triceps x 1 month, she thought it would go away, she has 2-3/10 aching pain with certain ROM, she has tried taking it easy without significant relief. She has had no known trauma, she thinks it may have happened after she pruned her garden using the large shears and also small ones, she is an avid gardener, she is right hand dominant, she has had rotator cuff issues before and was recommended to get a steroid injection but she declined and instead went to PT and did well with that. She has not had problems since. She feels the pain is different, it occurs at the same time but she feels it is "2 different  Sources".   Does not understand why she is having clavicle pain. She does not want any steroids. Denies numbness, weakness, tingling  Past Medical History  Diagnosis Date  . Thyroid disease   . Hyperlipidemia   . Hypertension    Past Surgical History  Procedure Laterality Date  . Tonsillectomy  1951  . Orthopedic surgery  2012  . Eye surgery     History   Social History  . Marital Status: Married    Spouse Name: N/A  . Number of Children: N/A  . Years of Education: N/A   Occupational History  . unemployed    Social History Main Topics  . Smoking status: Never Smoker   . Smokeless tobacco: Not on file  . Alcohol Use: 1.0 oz/week    2 drink(s) per week     Comment: 1  . Drug Use: No  . Sexual Activity: Not on file   Other Topics Concern  . None   Social History Narrative   Married. Education: college. Pt does exercise-   Family History  Problem Relation Age of Onset  . Cancer Father   . Heart disease Brother   . Dementia Brother    Allergies  Allergen Reactions  . Plavix [Clopidogrel Bisulfate]    Prior to  Admission medications   Medication Sig Start Date End Date Taking? Authorizing Provider  aspirin 81 MG tablet Take 81 mg by mouth daily.    Yes Hayden Rasmussen, MD  atorvastatin (LIPITOR) 40 MG tablet Take 1 tablet (40 mg total) by mouth daily. 11/27/14  Yes [name], MD  benazepril (LOTENSIN) 10 MG tablet Take 1 tablet (10 mg total) by mouth daily. 11/27/14  Yes [name], MD  levothyroxine (SYNTHROID, LEVOTHROID) 25 MCG tablet Take 1 tablet (25 mcg total) by mouth daily before breakfast. 11/27/14  Yes [name], MD  metoprolol tartrate (LOPRESSOR) 25 MG tablet Take 0.5 tablets (12.5 mg total) by mouth 2 (two) times daily. 11/27/14  Yes [name], MD  OVER THE COUNTER MEDICATION Vitamin D 2000iu daily.    Historical Provider, MD     ROS: The patient denies fevers, chills, night sweats, unintentional weight loss, chest pain, palpitations, wheezing, dyspnea on exertion, nausea, vomiting, abdominal pain, dysuria, hematuria, melena, numbness, weakness, or tingling.   All other systems have been reviewed and were otherwise negative with the exception of those mentioned in the HPI and as above.    PHYSICAL EXAM: Filed Vitals:  01/23/15 1512  BP: 118/60  Pulse: 82  Temp: 97.7 F (36.5 C)  Resp: 16   Filed Vitals:   01/23/15 1512  Height: 5' 6.75" (1.695 m)  Weight: 165 lb (74.844 kg)   Body mass index is 26.05 kg/(m^2).  General: Alert, no acute distress HEENT:  Normocephalic, atraumatic, oropharynx patent. EOMI, PERRLA Cardiovascular:  Regular rate and rhythm, no rubs murmurs or gallops.  Radial pulse intact. No pedal edema.  Respiratory: Clear to auscultation bilaterally.  No wheezes, rales, or rhonchi.  No cyanosis, no use of accessory musculature GI: No organomegaly, abdomen is soft and non-tender, positive bowel sounds.  No masses. Skin: No rashes. Neurologic: Facial musculature symmetric. Psychiatric: Patient is appropriate throughout our  interaction. Lymphatic: No cervical lymphadenopathy Musculoskeletal: Gait intact. Neck exam normal-neg spurling Right clavicle nontender but she has pain on the lateral aspect when she abbducts Right Shoulder No deformity, no hypertrophy/atrophy, no erythema, no fluid, no wounds Full ROM but pain with abduction in triceps above 90, more so with  liftoff test  Nontender at [facility] jt Neg Empty Can test, neg Lift off test, neg Speeds, Neg Hawkins/Neers 5/5 strength, 2/2 triceps and biceps DTRs She does not have a winged scapula per say but she has right scapular dyskinesia  LABS: Results for orders placed or performed in visit on 11/28/14  COMPTE METABOLIC PANEL WITH GFR  Result Value Ref Range   Sodium 141 135 - 145 mEq/L   Potassium 4.8 3.5 - 5.3 mEq/L   Chloride 105 96 - 112 mEq/L   CO2 26 19 - 32 mEq/L   Glucose, Bld 90 70 - 99 mg/dL   BUN 14 6 - 23 mg/dL   Creat 0.69 0.50 - 1.10 mg/dL   Total Bilirubin 0.6 0.2 - 1.2 mg/dL   Alkaline Phosphatase 67 39 - 117 U/L   AST 23 0 - 37 U/L   ALT 20 0 - 35 U/L   Total Protein 6.5 6.0 - 8.3 g/dL   Albumin 4.4 3.5 - 5.2 g/dL   Calcium 9.7 8.4 - 10.5 mg/dL   GFR, Est African American >89 mL/min   GFR, Est Non African American >89 mL/min  Lipid panel  Result Value Ref Range   Cholesterol 163 0 - 200 mg/dL   Triglycerides 43 <150 mg/dL   HDL 68 >39 mg/dL   Total CHOL/HDL Ratio 2.4 Ratio   VLDL 9 0 - 40 mg/dL   LDL Cholesterol 86 0 - 99 mg/dL  TSH  Result Value Ref Range   TSH 3.008 0.350 - 4.500 uIU/mL     EKG/XRAY:   Primary read interpreted by Dr. Marin Comment at [facility]. Neg for fracture or dislocation   ASSESSMENT/PLAN: Encounter Diagnoses  Name Primary?  . Right shoulder pain Yes  . Sprain and strain   . Clavicle pain, right    Pleasant 69 year old F with PMH of thyroid disease, HTN, hyperlipidemia, rotator cuff injury, and also osteopenia who is here for Right Rotator cuff and clavicle pain with 1 month hx of rotator cuff pain  with scapular dyskinesia.  She declines taking oral meds, she was recommended to take Arnica Refer to PT, modify acitivities Fu with Dr Cindra Presume sideeffects, risk and benefits, and alternatives of medications d/w patient. Patient is aware that all medications have potential sideeffects and we are unable to predict every sideeffect or drug-drug interaction that may occur.  , [place], DO 01/25/2015 7:40 AM

## 2015-01-25 ENCOUNTER — Encounter: Payer: Self-pay | Admitting: Family Medicine

## 2015-01-29 DIAGNOSIS — M25511 Pain in right shoulder: Secondary | ICD-10-CM | POA: Diagnosis not present

## 2015-01-31 DIAGNOSIS — M25511 Pain in right shoulder: Secondary | ICD-10-CM | POA: Diagnosis not present

## 2015-02-05 DIAGNOSIS — M25511 Pain in right shoulder: Secondary | ICD-10-CM | POA: Diagnosis not present

## 2015-02-07 DIAGNOSIS — M25511 Pain in right shoulder: Secondary | ICD-10-CM | POA: Diagnosis not present

## 2015-02-12 DIAGNOSIS — M25511 Pain in right shoulder: Secondary | ICD-10-CM | POA: Diagnosis not present

## 2015-02-19 DIAGNOSIS — M25511 Pain in right shoulder: Secondary | ICD-10-CM | POA: Diagnosis not present

## 2015-02-21 DIAGNOSIS — M25511 Pain in right shoulder: Secondary | ICD-10-CM | POA: Diagnosis not present

## 2015-03-29 DIAGNOSIS — R94112 Abnormal visually evoked potential [VEP]: Secondary | ICD-10-CM | POA: Diagnosis not present

## 2015-03-29 DIAGNOSIS — H2510 Age-related nuclear cataract, unspecified eye: Secondary | ICD-10-CM | POA: Diagnosis not present

## 2015-03-29 DIAGNOSIS — H53419 Scotoma involving central area, unspecified eye: Secondary | ICD-10-CM | POA: Diagnosis not present

## 2015-04-10 ENCOUNTER — Ambulatory Visit (INDEPENDENT_AMBULATORY_CARE_PROVIDER_SITE_OTHER): Payer: Medicare Other | Admitting: Family Medicine

## 2015-04-10 VITALS — BP 108/76 | HR 66 | Temp 98.0°F | Resp 16 | Ht 66.75 in | Wt 159.6 lb

## 2015-04-10 DIAGNOSIS — H6981 Other specified disorders of Eustachian tube, right ear: Secondary | ICD-10-CM

## 2015-04-10 DIAGNOSIS — D229 Melanocytic nevi, unspecified: Secondary | ICD-10-CM

## 2015-04-10 DIAGNOSIS — D239 Other benign neoplasm of skin, unspecified: Secondary | ICD-10-CM | POA: Diagnosis not present

## 2015-04-10 NOTE — Progress Notes (Signed)
[facility] [address] [place] [address] [phone]  Date:  04/10/2015   Name:  Doris Wilkerson   DOB:  10-10-1946   MRN:  [id number]  PCP:  [name], MD    Chief Complaint: ear ache and Other   History of Present Illness:  Doris Wilkerson is a 69 y.o. very pleasant female patient who presents with the following:  Here today with a couple of concerns.  She has noted that her right ear has felt congested for the last week or so, .ike it needs to pop.  No ST, cough, fever or other URI sx.   The left ear is ok She recently got new glasses and they were irritating her ear so she is not sure if this is the cause Also she has noted a couple of moles on her left lower back that tend to be irritated by clothing.  She is not quite sure how long they have been present;  No skin cancer history   Patient Active Problem List   Diagnosis Date Noted  . Pain in joint, ankle and foot 02/14/2013  . Foot fracture 02/14/2013  . CAD (coronary artery disease) 12/09/2011  . Dyslipidemia 12/09/2011  . Osteopenia 12/09/2011  . Hypothyroid 12/09/2011  . Asthma 12/09/2011    Past Medical History  Diagnosis Date  . Thyroid disease   . Hyperlipidemia   . Hypertension     Past Surgical History  Procedure Laterality Date  . Tonsillectomy  1951  . Orthopedic surgery  2012  . Eye surgery      History  Substance Use Topics  . Smoking status: Never Smoker   . Smokeless tobacco: Not on file  . Alcohol Use: 1.0 oz/week    2 drink(s) per week     Comment: 1    Family History  Problem Relation Age of Onset  . Cancer Father   . Heart disease Brother   . Dementia Brother     Allergies  Allergen Reactions  . Plavix [Clopidogrel Bisulfate]     Medication list has been reviewed and updated.  Current Outpatient Prescriptions on File Prior to Visit  Medication Sig Dispense Refill  . aspirin 81 MG tablet Take 81 mg by mouth daily.     Marland Kitchen atorvastatin (LIPITOR) 40  MG tablet Take 1 tablet (40 mg total) by mouth daily. 90 tablet 1  . benazepril (LOTENSIN) 10 MG tablet Take 1 tablet (10 mg total) by mouth daily. 90 tablet 1  . levothyroxine (SYNTHROID, LEVOTHROID) 25 MCG tablet Take 1 tablet (25 mcg total) by mouth daily before breakfast. 90 tablet 1  . metoprolol tartrate (LOPRESSOR) 25 MG tablet Take 0.5 tablets (12.5 mg total) by mouth 2 (two) times daily. 90 tablet 1  . OVER THE COUNTER MEDICATION Vitamin D 2000iu daily.     No current facility-administered medications on file prior to visit.    Review of Systems:  As per HPI- otherwise negative.   Physical Examination: Filed Vitals:   04/10/15 1641  BP: 108/76  Pulse: 66  Temp: 98 F (36.7 C)  Resp: 16   Filed Vitals:   04/10/15 1641  Height: 5' 6.75" (1.695 m)  Weight: 159 lb 9.6 oz (72.394 kg)   Body mass index is 25.2 kg/(m^2). Ideal Body Weight: Weight in (lb) to have BMI = 25: 158.1  GEN: WDWN, NAD, Non-toxic, A & O x 3, looks well   HEENT: Atraumatic, Normocephalic. Neck supple. No  masses, No LAD.  Bilateral TM wnl, oropharynx normal.  PEERL,EOMI.    Nasal cavity with mild congestion.   Ears and Nose: No external deformity. CV: RRR, No M/G/R. No JVD. No thrill. No extra heart sounds. PULM: CTA B, no wheezes, crackles, rhonchi. No retractions. No resp. distress. No accessory muscle use. EXTR: No c/c/e NEURO Normal gait.  PSYCH: Normally interactive. Conversant. Not depressed or anxious appearing.  Calm demeanor.  She has a seb keratosis on the right shoulder, 2 excoreiated skin lesions that appear to be warts on the left lower back, and a mole in the mid back with a somewhat irregular border and color  Assessment and Plan: ETD (eustachian tube dysfunction), right  Atypical nevus - Plan: Ambulatory referral to Dermatology  See patient instructions for more details.   Will refer to derm for further evaluation   Signed Lamar Blinks, MD

## 2015-04-10 NOTE — Patient Instructions (Signed)
I will refer you to a dermatologist to look at your back Try some afrin (or generic) nasal spray for a few days; let me know if your ear does not look better

## 2015-04-28 ENCOUNTER — Ambulatory Visit (INDEPENDENT_AMBULATORY_CARE_PROVIDER_SITE_OTHER): Payer: Medicare Other | Admitting: Emergency Medicine

## 2015-04-28 VITALS — BP 120/80 | HR 97 | Temp 97.8°F | Resp 12 | Ht 67.0 in | Wt 159.4 lb

## 2015-04-28 DIAGNOSIS — L089 Local infection of the skin and subcutaneous tissue, unspecified: Secondary | ICD-10-CM | POA: Diagnosis not present

## 2015-04-28 DIAGNOSIS — W57XXXA Bitten or stung by nonvenomous insect and other nonvenomous arthropods, initial encounter: Secondary | ICD-10-CM | POA: Diagnosis not present

## 2015-04-28 MED ORDER — SULFAMETHOXAZOLE-TRIMETHOPRIM 800-160 MG PO TABS: 1.0000 | ORAL_TABLET | Freq: Two times a day (BID) | ORAL | Status: DC

## 2015-04-28 MED ORDER — METHYLPREDNISOLONE ACETATE 80 MG/ML IJ SUSP
80.0000 mg | Freq: Once | INTRAMUSCULAR | Status: AC
  Administered 2015-04-28: 80 mg via INTRAMUSCULAR

## 2015-04-28 NOTE — Progress Notes (Signed)
Subjective:  Patient ID: Doris Wilkerson, female    DOB: 07-08-1946  Age: 69 y.o. MRN: [id number]  CC: Insect Bite   HPI Doris Wilkerson presents  Within injury while gardening. She states she was bitten by a stinging insect on her left arm. This apparently happened several days ago. He now has a generalized area of erythema and swelling on the posterior aspect of her upper arm. She has no fever or chills. No cough or coryza she has some swelling and pain in her axilla.  She's had no improvement with over-the-counter medication. She's had no respiratory symptoms.  Outpatient Prescriptions Prior to Visit  Medication Sig Dispense Refill  . aspirin 81 MG tablet Take 81 mg by mouth daily.     Marland Kitchen atorvastatin (LIPITOR) 40 MG tablet Take 1 tablet (40 mg total) by mouth daily. 90 tablet 1  . benazepril (LOTENSIN) 10 MG tablet Take 1 tablet (10 mg total) by mouth daily. 90 tablet 1  . levothyroxine (SYNTHROID, LEVOTHROID) 25 MCG tablet Take 1 tablet (25 mcg total) by mouth daily before breakfast. 90 tablet 1  . metoprolol tartrate (LOPRESSOR) 25 MG tablet Take 0.5 tablets (12.5 mg total) by mouth 2 (two) times daily. 90 tablet 1  . OVER THE COUNTER MEDICATION Vitamin D 2000iu daily.     No facility-administered medications prior to visit.    History   Social History  . Marital Status: Married    Spouse Name: N/A  . Number of Children: N/A  . Years of Education: N/A   Occupational History  . unemployed    Social History Main Topics  . Smoking status: Never Smoker   . Smokeless tobacco: Never Used  . Alcohol Use: 1.2 oz/week    2 Standard drinks or equivalent per week     Comment: 1  . Drug Use: No  . Sexual Activity: Not on file   Other Topics Concern  . None   Social History Narrative   Married. Education: college. Pt does exercise-    Family History  Problem Relation Age of Onset  . Cancer Father   . Heart disease Brother   . Dementia Brother     Past Medical History    Diagnosis Date  . Thyroid disease   . Hyperlipidemia   . Hypertension      Review of Systems  Constitutional: Negative for fever, chills and appetite change.  HENT: Negative for congestion, ear pain, postnasal drip, sinus pressure and sore throat.   Eyes: Negative for pain and redness.  Respiratory: Negative for cough, shortness of breath and wheezing.   Cardiovascular: Negative for leg swelling.  Gastrointestinal: Negative for nausea, vomiting, abdominal pain, diarrhea, constipation and blood in stool.  Endocrine: Negative for polyuria.  Genitourinary: Negative for dysuria, urgency, frequency and flank pain.  Musculoskeletal: Negative for gait problem.  Skin: Positive for rash.  Neurological: Negative for weakness and headaches.  Psychiatric/Behavioral: Negative for confusion and decreased concentration. The patient is not nervous/anxious.     Objective:  BP 120/80 mmHg  Pulse 97  Temp(Src) 97.8 F (36.6 C) (Oral)  Resp 12  Ht 5\' 7"  (1.702 m)  Wt 159 lb 6 oz (72.292 kg)  BMI 24.96 kg/m2  SpO2 9%  BP Readings from Last 3 Encounters:  04/28/15 120/80  04/10/15 108/76  01/23/15 118/60    Wt Readings from Last 3 Encounters:  04/28/15 159 lb 6 oz (72.292 kg)  04/10/15 159 lb 9.6 oz (72.394 kg)  01/23/15 165  lb (74.844 kg)    Physical Exam  Constitutional: She is oriented to person, place, and time. She appears well-developed and well-nourished.  HENT:  Head: Normocephalic and atraumatic.  Eyes: Conjunctivae are normal. Pupils are equal, round, and reactive to light.  Pulmonary/Chest: Effort normal.  Musculoskeletal: She exhibits no edema.  Neurological: She is alert and oriented to person, place, and time.  Skin: Skin is warm and dry. Rash noted. There is erythema.  Psychiatric: She has a normal mood and affect. Her behavior is normal. Thought content normal.    Lab Results  Component Value Date   WBC 4.5 04/10/2014   HGB 13.0 04/10/2014   HCT 38.7  04/10/2014   PLT 305 04/10/2014   GLUCOSE 90 11/28/2014   CHOL 163 11/28/2014   TRIG 43 11/28/2014   HDL 68 11/28/2014   LDLCALC 86 11/28/2014   ALT 20 11/28/2014   AST 23 11/28/2014   NA 141 11/28/2014   K 4.8 11/28/2014   CL 105 11/28/2014   CREATININE 0.69 11/28/2014   BUN 14 11/28/2014   CO2 26 11/28/2014   TSH 3.008 11/28/2014   INR 1.02 06/20/2011      .  Assessment & Plan:   Lus was seen today for insect bite.  Diagnoses and all orders for this visit:  Insect bite, infected Orders: -     methylPREDNISolone acetate (DEPO-MEDROL) injection 80 mg; Inject 1 mL (80 mg total) into the muscle once.  Other orders -     sulfamethoxazole-trimethoprim (BACTRIM DS,SEPTRA DS) 800-160 MG per tablet; Take 1 tablet by mouth 2 (two) times daily.   I am having Ms. Aldredge start on sulfamethoxazole-trimethoprim. I am also having her maintain her aspirin, OVER THE COUNTER MEDICATION, metoprolol tartrate, benazepril, atorvastatin, and levothyroxine. We administered methylPREDNISolone acetate.  Meds ordered this encounter  Medications  . methylPREDNISolone acetate (DEPO-MEDROL) injection 80 mg    Sig:   . sulfamethoxazole-trimethoprim (BACTRIM DS,SEPTRA DS) 800-160 MG per tablet    Sig: Take 1 tablet by mouth 2 (two) times daily.    Dispense:  20 tablet    Refill:  0    Appropriate red flag conditions were discussed with the patient as well as actions that should be taken.  Patient expressed his understanding.  Follow-up: Return if symptoms worsen or fail to improve.  Roselee Culver, MD

## 2015-04-28 NOTE — Patient Instructions (Signed)

## 2015-05-06 ENCOUNTER — Ambulatory Visit (INDEPENDENT_AMBULATORY_CARE_PROVIDER_SITE_OTHER): Payer: Medicare Other | Admitting: Physician Assistant

## 2015-05-06 VITALS — BP 102/68 | HR 76 | Temp 98.5°F | Resp 16 | Ht 67.0 in | Wt 159.0 lb

## 2015-05-06 DIAGNOSIS — K529 Noninfective gastroenteritis and colitis, unspecified: Secondary | ICD-10-CM

## 2015-05-06 DIAGNOSIS — W57XXXA Bitten or stung by nonvenomous insect and other nonvenomous arthropods, initial encounter: Secondary | ICD-10-CM | POA: Diagnosis not present

## 2015-05-06 DIAGNOSIS — T148 Other injury of unspecified body region: Secondary | ICD-10-CM | POA: Diagnosis not present

## 2015-05-06 DIAGNOSIS — L089 Local infection of the skin and subcutaneous tissue, unspecified: Secondary | ICD-10-CM | POA: Diagnosis not present

## 2015-05-06 NOTE — Progress Notes (Signed)
   Subjective:    Patient ID: Doris Wilkerson, female    DOB: [birth year], 69 y.o.   MRN: [id number]  HPI Patient presents for f/u of bug bite and stomach cramping.   Was seen for infected bug bite 1 week ago and pain and redness have resolved. Has been compliant with bactrim regimen, however, yesterday morning after taking dose of medication had stomach cramping and nausea. Denies fever, diarrhea, change in appetite, or vomiting, but endorses bloating. Has keeps food and liquids down thus far. No known sick contacts. Has not tried any new foods or raw foods. Wanted to know if cramping was from medication or stomach virus.  Med allergy to plavix.   Review of Systems  Constitutional: Negative for fever, appetite change and fatigue.  Gastrointestinal: Positive for nausea. Negative for vomiting, diarrhea and constipation.  Genitourinary: Negative.   Skin: Negative.  Negative for color change, rash and wound.  Neurological: Negative for weakness.       Objective:   Physical Exam  Constitutional: She is oriented to person, place, and time. She appears well-developed and well-nourished. No distress.  Blood pressure 102/68, pulse 76, temperature 98.5 F (36.9 C), temperature source Oral, resp. rate 16, height 5\' 7"  (1.702 m), weight 159 lb (72.122 kg), SpO2 98 %.   HENT:  Head: Normocephalic and atraumatic.  Right Ear: External ear normal.  Left Ear: External ear normal.  Eyes: Conjunctivae are normal. Right eye exhibits no discharge. Left eye exhibits no discharge. No scleral icterus.  Cardiovascular: Normal rate, regular rhythm and normal heart sounds.  Exam reveals no gallop and no friction rub.   No murmur heard. Pulmonary/Chest: Effort normal and breath sounds normal. No respiratory distress. She has no wheezes. She has no rales.  Abdominal: Soft. Bowel sounds are normal. She exhibits no distension and no mass. There is no hepatosplenomegaly. There is no tenderness. There is no rigidity,  no rebound, no guarding, no tenderness at McBurney's point and negative Murphy's sign. No hernia.  Neurological: She is alert and oriented to person, place, and time.  Skin: She is not diaphoretic.  Psychiatric: She has a normal mood and affect. Her behavior is normal. Judgment and thought content normal.       Assessment & Plan:  1. Gastroenteritis Discontinue bactrim. Continue to stay hydrated and eat as tolerable. Anticipatory guidance discussed.   2. Insect bite, infected Resolved.   Alveta Heimlich PA-C  [facility] 05/06/2015 8:47 AM

## 2015-05-14 ENCOUNTER — Ambulatory Visit (INDEPENDENT_AMBULATORY_CARE_PROVIDER_SITE_OTHER): Payer: Medicare Other | Admitting: Family Medicine

## 2015-05-14 ENCOUNTER — Encounter: Payer: Self-pay | Admitting: Family Medicine

## 2015-05-14 VITALS — BP 107/66 | HR 70 | Temp 98.0°F | Resp 16 | Ht 66.5 in | Wt 154.6 lb

## 2015-05-14 DIAGNOSIS — I10 Essential (primary) hypertension: Secondary | ICD-10-CM

## 2015-05-14 DIAGNOSIS — E785 Hyperlipidemia, unspecified: Secondary | ICD-10-CM | POA: Diagnosis not present

## 2015-05-14 DIAGNOSIS — E039 Hypothyroidism, unspecified: Secondary | ICD-10-CM | POA: Diagnosis not present

## 2015-05-14 DIAGNOSIS — Z23 Encounter for immunization: Secondary | ICD-10-CM

## 2015-05-14 LAB — COMPLETE METABOLIC PANEL WITH GFR
ALK PHOS: 66 U/L (ref 39–117)
ALT: 22 U/L (ref 0–35)
AST: 23 U/L (ref 0–37)
Albumin: 4.4 g/dL (ref 3.5–5.2)
BUN: 17 mg/dL (ref 6–23)
CHLORIDE: 106 meq/L (ref 96–112)
CO2: 24 mEq/L (ref 19–32)
Calcium: 9.6 mg/dL (ref 8.4–10.5)
Creat: 0.74 mg/dL (ref 0.50–1.10)
GFR, Est African American: >89 mL/min
GFR, Est Non African American: 84 mL/min
Glucose, Bld: 88 mg/dL (ref 70–99)
POTASSIUM: 4.5 meq/L (ref 3.5–5.3)
SODIUM: 140 meq/L (ref 135–145)
Total Bilirubin: 0.5 mg/dL (ref 0.2–1.2)
Total Protein: 6.4 g/dL (ref 6.0–8.3)

## 2015-05-14 LAB — LIPID PANEL
CHOL/HDL RATIO: 2.8 ratio
CHOLESTEROL: 130 mg/dL (ref 0–200)
HDL: 47 mg/dL (ref >=46)
LDL Cholesterol: 72 mg/dL (ref 0–99)
TRIGLYCERIDES: 57 mg/dL (ref <150)
VLDL: 11 mg/dL (ref 0–40)

## 2015-05-14 LAB — TSH: TSH: 2.328 u[IU]/mL (ref 0.350–4.500)

## 2015-05-14 MED ORDER — LEVOTHYROXINE SODIUM 25 MCG PO TABS
25.0000 ug | ORAL_TABLET | Freq: Every day | ORAL | Status: DC
Start: 2015-05-14 — End: 2015-09-21

## 2015-05-14 MED ORDER — BENAZEPRIL HCL 10 MG PO TABS: 10.0000 mg | ORAL_TABLET | Freq: Every day | ORAL | Status: DC

## 2015-05-14 MED ORDER — METOPROLOL TARTRATE 25 MG PO TABS: 12.5000 mg | ORAL_TABLET | Freq: Two times a day (BID) | ORAL | Status: DC

## 2015-05-14 MED ORDER — ATORVASTATIN CALCIUM 40 MG PO TABS: 40.0000 mg | ORAL_TABLET | Freq: Every day | ORAL | Status: DC

## 2015-05-14 NOTE — Progress Notes (Signed)
Subjective:   This chart was scribed for Doris Ray, MD by Thea Alken, ED Scribe. This patient was seen in room 24 and the patient's care was started at 3:41 PM.   Patient ID: Doris Wilkerson, female    DOB: [birth year], 68 y.o.   MRN: [id number]  HPI   Chief Complaint  Patient presents with  . Hyperlipidemia    6 month check up  . Hypothyroidism   HPI Comments: Doris Wilkerson is a 69 y.o. female who presents to the [facility] for a 6 month follow up. Pt states she is doing well and is still gardening.  Hyperlipidemia She has hx of CAD. Status post stent to LAD 5 years ago. Cardiology Dr. Einar Gip. She is on Lipitor 40 mg QD and aspirin 81 mg. She denies new muscle aches.   Lab Results  Component Value Date   CHOL 163 11/28/2014   HDL 68 11/28/2014   LDLCALC 86 11/28/2014   TRIG 43 11/28/2014   CHOLHDL 2.4 11/28/2014   Hyperthyroidism She takes synthroid 25 mcg once daily. Pt states she intentionally lost 5 lbs with exercise.  Lab Results  Component Value Date   TSH 3.008 11/28/2014   Wt Readings from Last 3 Encounters:  05/14/15 154 lb 9.6 oz (70.126 kg)  05/06/15 159 lb (72.122 kg)  04/28/15 159 lb 6 oz (72.292 kg)   Hypertension Pt is on Lotensin 10 mg daily and  metoprolol 5 mg BID. Pt denies side effects such as chest pain, SOB , dizziness and HA. Pt has been doing bike exercises in the gym.  Skin Pt was seen earlier this month for a insect but to left arm. Pt has an upcoming appointment rheumatology for 2 moles on her back.   Need for pneumonia vaccine  She had PVC November 2014. Will have prevnar today.   Vision Pt has been told cataract on right eye is developing.   Patient Active Problem List   Diagnosis Date Noted  . Pain in joint, ankle and foot 02/14/2013  . Foot fracture 02/14/2013  . CAD (coronary artery disease) 12/09/2011  . Dyslipidemia 12/09/2011  . Osteopenia 12/09/2011  . Hypothyroid 12/09/2011  . Asthma 12/09/2011    Past Medical History  Diagnosis Date  . Thyroid disease   . Hyperlipidemia   . Hypertension    Past Surgical History  Procedure Laterality Date  . Tonsillectomy  1951  . Orthopedic surgery  2012  . Eye surgery     Allergies  Allergen Reactions  . Plavix [Clopidogrel Bisulfate]    Prior to Admission medications   Medication Sig Start Date End Date Taking? Authorizing Provider  aspirin 81 MG tablet Take 81 mg by mouth daily.    Yes Hayden Rasmussen, MD  atorvastatin (LIPITOR) 40 MG tablet Take 1 tablet (40 mg total) by mouth daily. 11/27/14  Yes [name], MD  benazepril (LOTENSIN) 10 MG tablet Take 1 tablet (10 mg total) by mouth daily. 11/27/14  Yes [name], MD  levothyroxine (SYNTHROID, LEVOTHROID) 25 MCG tablet Take 1 tablet (25 mcg total) by mouth daily before breakfast. 11/27/14  Yes [name], MD  metoprolol tartrate (LOPRESSOR) 25 MG tablet Take 0.5 tablets (12.5 mg total) by mouth 2 (two) times daily. 11/27/14  Yes [name], MD  OVER THE COUNTER MEDICATION Vitamin D 2000iu daily.    Historical Provider, MD   History   Social History  . Marital Status: Married  Spouse Name: N/A  . Number of Children: N/A  . Years of Education: N/A   Occupational History  . unemployed    Social History Main Topics  . Smoking status: Never Smoker   . Smokeless tobacco: Never Used  . Alcohol Use: 1.2 oz/week    2 Standard drinks or equivalent per week     Comment: 1  . Drug Use: No  . Sexual Activity: Not on file   Other Topics Concern  . Not on file   Social History Narrative   Married. Education: college. Pt does exercise-   Review of Systems  Constitutional: Negative for fatigue and unexpected weight change.  Respiratory: Negative for chest tightness and shortness of breath.   Cardiovascular: Negative for chest pain, palpitations and leg swelling.  Gastrointestinal: Negative for abdominal pain and blood in stool.  Musculoskeletal: Negative for  myalgias.  Neurological: Negative for dizziness, syncope, light-headedness and headaches.   Objective:   Physical Exam  Constitutional: She is oriented to person, place, and time. She appears well-developed and well-nourished.  HENT:  Head: Normocephalic and atraumatic.  Eyes: Conjunctivae and EOM are normal. Pupils are equal, round, and reactive to light.  Neck: Carotid bruit is not present. No thyromegaly present.  No nodules palpated  Cardiovascular: Normal rate, regular rhythm, normal heart sounds and intact distal pulses.   Pulmonary/Chest: Effort normal and breath sounds normal.  Abdominal: Soft. She exhibits no pulsatile midline mass. There is no tenderness.  Neurological: She is alert and oriented to person, place, and time.  Skin: Skin is warm and dry.  Psychiatric: She has a normal mood and affect. Her behavior is normal.  Vitals reviewed.  Filed Vitals:   05/14/15 1510  BP: 107/66  Pulse: 70  Temp: 98 F (36.7 C)  TempSrc: Oral  Resp: 16  Height: 5' 6.5" (1.689 m)  Weight: 154 lb 9.6 oz (70.126 kg)  SpO2: 100%   Assessment & Plan:   Doris Wilkerson is a 69 y.o. female Essential hypertension - Plan: benazepril (LOTENSIN) 10 MG tablet, metoprolol tartrate (LOPRESSOR) 25 MG tablet  -Controlled. Continue same dose of Lotensin and Lopressor  Hyperlipidemia - Plan: atorvastatin (LIPITOR) 40 MG tablet, COMPLETE METABOLIC PANEL WITH GFR, Lipid panel  -With history of CAD. Prior lipids controlled. Tolerating Lipitor 40 mg. Continue same dose. Labs pending.  Hypothyroidism, unspecified hypothyroidism type - Plan: levothyroxine (SYNTHROID, LEVOTHROID) 25 MCG tablet, TSH  -Controlled prior TSH pending, continue same dose of Synthroid for now.  Need for prophylactic vaccination against Streptococcus pneumoniae (pneumococcus) - Plan: Pneumococcal conjugate vaccine 13-valent IM given   Meds ordered this encounter  Medications  . benazepril (LOTENSIN) 10 MG tablet    Sig: Take  1 tablet (10 mg total) by mouth daily.    Dispense:  90 tablet    Refill:  1  . metoprolol tartrate (LOPRESSOR) 25 MG tablet    Sig: Take 0.5 tablets (12.5 mg total) by mouth 2 (two) times daily.    Dispense:  90 tablet    Refill:  1  . levothyroxine (SYNTHROID, LEVOTHROID) 25 MCG tablet    Sig: Take 1 tablet (25 mcg total) by mouth daily before breakfast.    Dispense:  90 tablet    Refill:  1  . atorvastatin (LIPITOR) 40 MG tablet    Sig: Take 1 tablet (40 mg total) by mouth daily.    Dispense:  90 tablet    Refill:  1   Patient Instructions  You should receive  a call or letter about your lab results within the next week to 10 days.  Plan on physical at next visit in 6 months.     I personally performed the services described in this documentation, which was scribed in my presence. The recorded information has been reviewed and considered, and addended by me as needed.

## 2015-05-14 NOTE — Patient Instructions (Signed)
You should receive a call or letter about your lab results within the next week to 10 days.  Plan on physical at next visit in 6 months.

## 2015-05-24 DIAGNOSIS — L821 Other seborrheic keratosis: Secondary | ICD-10-CM | POA: Diagnosis not present

## 2015-05-24 DIAGNOSIS — D485 Neoplasm of uncertain behavior of skin: Secondary | ICD-10-CM | POA: Diagnosis not present

## 2015-05-24 DIAGNOSIS — D1801 Hemangioma of skin and subcutaneous tissue: Secondary | ICD-10-CM | POA: Diagnosis not present

## 2015-06-29 DIAGNOSIS — D225 Melanocytic nevi of trunk: Secondary | ICD-10-CM | POA: Diagnosis not present

## 2015-09-17 ENCOUNTER — Ambulatory Visit: Payer: Medicare Other

## 2015-09-21 ENCOUNTER — Other Ambulatory Visit: Payer: Self-pay | Admitting: Family Medicine

## 2015-09-24 ENCOUNTER — Ambulatory Visit (INDEPENDENT_AMBULATORY_CARE_PROVIDER_SITE_OTHER): Payer: Medicare Other | Admitting: Family Medicine

## 2015-09-24 ENCOUNTER — Encounter: Payer: Self-pay | Admitting: Family Medicine

## 2015-09-24 VITALS — BP 134/85 | HR 80 | Temp 98.0°F | Resp 16 | Ht 66.5 in | Wt 153.0 lb

## 2015-09-24 DIAGNOSIS — E039 Hypothyroidism, unspecified: Secondary | ICD-10-CM

## 2015-09-24 DIAGNOSIS — I10 Essential (primary) hypertension: Secondary | ICD-10-CM

## 2015-09-24 DIAGNOSIS — Z23 Encounter for immunization: Secondary | ICD-10-CM

## 2015-09-24 DIAGNOSIS — E785 Hyperlipidemia, unspecified: Secondary | ICD-10-CM

## 2015-09-24 MED ORDER — LEVOTHYROXINE SODIUM 25 MCG PO TABS: ORAL_TABLET | ORAL | Status: DC

## 2015-09-24 MED ORDER — ATORVASTATIN CALCIUM 40 MG PO TABS: ORAL_TABLET | ORAL | Status: DC

## 2015-09-24 MED ORDER — METOPROLOL TARTRATE 25 MG PO TABS: ORAL_TABLET | ORAL | Status: DC

## 2015-09-24 MED ORDER — BENAZEPRIL HCL 10 MG PO TABS: ORAL_TABLET | ORAL | Status: DC

## 2015-09-24 NOTE — Progress Notes (Signed)
Subjective:    Patient ID: Doris Wilkerson, female    DOB: [birth year], 69 y.o.   MRN: [id number] This chart was scribed for [name], MD by Zola Button, Medical Scribe. This patient was seen in Room 25 and the patient's care was started at 8:18 AM.    HPI HPI Comments: Doris Wilkerson is a 69 y.o. female who presents to the [facility] for a medication refill. Patient will be switching to the [facility] pharmacy in January.  Hypertension: With history of CAD. Last visit June 20. BP stable then. She had a stent to the LAD 5 years ago by Dr. Einar Gip. No changes last visit. She has not had any problems with her medications. Patient denies chest pain and SOB.  Hypothyroidism: Continued Synthroid.  Lab Results  Component Value Date   TSH 2.328 05/14/2015     Hyperlipidemia: Continued same dose of Lipitor. No new side effects with the Lipitor. Lab Results  Component Value Date   CHOL 130 05/14/2015   HDL 47 05/14/2015   LDLCALC 72 05/14/2015   TRIG 57 05/14/2015   CHOLHDL 2.8 05/14/2015    Health maintenance: She will receive a flu shot today.   Colon cancer screening: She believes her last colonoscopy was about 9 years ago.  Arthralgias: Patient reports having some arthralgias and often wakes up with joint "creakiness." She has not tried glucosamine chondroitin.   She has 2 dogs at home. They have arthritis and take Zoom.  Patient Active Problem List   Diagnosis Date Noted  . Pain in joint, ankle and foot 02/14/2013  . Foot fracture 02/14/2013  . CAD (coronary artery disease) 12/09/2011  . Dyslipidemia 12/09/2011  . Osteopenia 12/09/2011  . Hypothyroid 12/09/2011  . Asthma 12/09/2011   Past Medical History  Diagnosis Date  . Thyroid disease   . Hyperlipidemia   . Hypertension    Past Surgical History  Procedure Laterality Date  . Tonsillectomy  1951  . Orthopedic surgery  2012  . Eye surgery     Allergies  Allergen Reactions  . Plavix [Clopidogrel  Bisulfate]    Prior to Admission medications   Medication Sig Start Date End Date Taking? Authorizing Provider  aspirin 81 MG tablet Take 81 mg by mouth daily.    Yes Hayden Rasmussen, MD  atorvastatin (LIPITOR) 40 MG tablet Take 1 tablet by mouth  daily 09/21/15  Yes [name], MD  benazepril (LOTENSIN) 10 MG tablet Take 1 tablet by mouth  daily 09/21/15  Yes [name], MD  levothyroxine (SYNTHROID, LEVOTHROID) 25 MCG tablet Take 1 tablet by mouth  daily before breakfast 09/21/15  Yes [name], MD  metoprolol tartrate (LOPRESSOR) 25 MG tablet Take one-half tablet by  mouth two times daily 09/21/15  Yes [name], MD  OVER THE COUNTER MEDICATION Vitamin D 2000iu daily.   Yes Historical Provider, MD   Social History   Social History  . Marital Status: Married    Spouse Name: N/A  . Number of Children: N/A  . Years of Education: N/A   Occupational History  . unemployed    Social History Main Topics  . Smoking status: Never Smoker   . Smokeless tobacco: Never Used  . Alcohol Use: 1.2 oz/week    2 Standard drinks or equivalent per week     Comment: 1  . Drug Use: No  . Sexual Activity: Not on file   Other Topics Concern  .  Not on file   Social History Narrative   Married. Education: college. Pt does exercise-     Review of Systems  Constitutional: Negative for fatigue and unexpected weight change.  Respiratory: Negative for chest tightness and shortness of breath.   Cardiovascular: Negative for chest pain, palpitations and leg swelling.  Gastrointestinal: Negative for abdominal pain and blood in stool.  Neurological: Negative for dizziness, syncope, light-headedness and headaches.       Objective:   Physical Exam  Constitutional: She is oriented to person, place, and time. She appears well-developed and well-nourished. No distress.  HENT:  Head: Normocephalic and atraumatic.  Mouth/Throat: Oropharynx is clear and moist. No oropharyngeal  exudate.  Eyes: Pupils are equal, round, and reactive to light.  Neck: Neck supple.  Cardiovascular: Normal rate.   Pulmonary/Chest: Effort normal.  Musculoskeletal: She exhibits no edema.  Neurological: She is alert and oriented to person, place, and time. No cranial nerve deficit.  Skin: Skin is warm and dry. No rash noted.  Psychiatric: She has a normal mood and affect. Her behavior is normal.  Nursing note and vitals reviewed.     Filed Vitals:   09/24/15 0757  BP: 134/85  Pulse: 80  Temp: 98 F (36.7 C)  Resp: 16  Height: 5' 6.5" (1.689 m)  Weight: 153 lb (69.4 kg)       Assessment & Plan:   Doris Wilkerson is a 69 y.o. female Essential hypertension - Plan: metoprolol tartrate (LOPRESSOR) 25 MG tablet, benazepril (LOTENSIN) 10 MG tablet  -stable.  Refilled meds for 90d supply on current plan, then new Rx's next ov for different pharmacy after first of the year. Labs deferred until next visit early next year.   Hyperlipidemia - Plan: atorvastatin (LIPITOR) 40 MG tablet  -tolerating Lipitor. No changes, labs next ov.   Flu vaccine need - Plan: Flu Vaccine QUAD 36+ mos IM given  Hypothyroidism, unspecified hypothyroidism type - Plan: levothyroxine (SYNTHROID, LEVOTHROID) 25 MCG tablet  -prior TSH WNL. Cont same dose for now.labs next ov.   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 NKI or acute changes.   -trial of otc glucosamine/chondroitin. If no relief in 6-8 weeks of use - D/c.  Discuss further next ov, sooner if worsening.   Meds ordered this encounter  Medications  . metoprolol tartrate (LOPRESSOR) 25 MG tablet    Sig: Take one-half tablet by  mouth two times daily    Dispense:  90 tablet    Refill:  0  . levothyroxine (SYNTHROID, LEVOTHROID) 25 MCG tablet    Sig: Take 1 tablet by mouth  daily before breakfast    Dispense:  90 tablet    Refill:  0  . benazepril (LOTENSIN) 10 MG tablet    Sig: Take 1 tablet by mouth  daily    Dispense:  90 tablet    Refill:  0  .  atorvastatin (LIPITOR) 40 MG tablet    Sig: Take 1 tablet by mouth  daily    Dispense:  90 tablet    Refill:  0   Patient Instructions  Ok to try glucosamine/chondroitin for joint pains. Can discuss this further at next visit.  No changes in meds at this time.  See you soon.       By signing my name below, I, Zola Button, attest that this documentation has been prepared under the direction and in the presence of [name], MD.  Electronically Signed: Zola Button, Medical Scribe. 09/24/2015. 8:28 AM.

## 2015-09-24 NOTE — Patient Instructions (Signed)
Ok to try glucosamine/chondroitin for joint pains. Can discuss this further at next visit.  No changes in meds at this time.  See you soon.

## 2015-10-24 ENCOUNTER — Telehealth: Payer: Self-pay | Admitting: Family Medicine

## 2015-10-24 NOTE — Telephone Encounter (Signed)
Patient came by to get a copy of her last lab results

## 2015-10-26 DIAGNOSIS — E7800 Pure hypercholesterolemia, unspecified: Secondary | ICD-10-CM | POA: Diagnosis not present

## 2015-10-26 DIAGNOSIS — I10 Essential (primary) hypertension: Secondary | ICD-10-CM | POA: Diagnosis not present

## 2015-10-26 DIAGNOSIS — I2510 Atherosclerotic heart disease of native coronary artery without angina pectoris: Secondary | ICD-10-CM | POA: Diagnosis not present

## 2015-10-26 DIAGNOSIS — R001 Bradycardia, unspecified: Secondary | ICD-10-CM | POA: Diagnosis not present

## 2015-12-17 ENCOUNTER — Telehealth: Payer: Self-pay | Admitting: Family Medicine

## 2015-12-17 NOTE — Telephone Encounter (Signed)
Left a message for patient to return call.  Would like to order her mammogram and colonoscopy if she has not had one.  If she has had one, where and when?

## 2015-12-23 ENCOUNTER — Other Ambulatory Visit: Payer: Self-pay | Admitting: Family Medicine

## 2015-12-24 ENCOUNTER — Ambulatory Visit (INDEPENDENT_AMBULATORY_CARE_PROVIDER_SITE_OTHER): Payer: Medicare Other | Admitting: Family Medicine

## 2015-12-24 ENCOUNTER — Encounter: Payer: Self-pay | Admitting: Family Medicine

## 2015-12-24 VITALS — BP 109/70 | HR 73 | Temp 98.1°F | Resp 16 | Ht 66.5 in | Wt 157.0 lb

## 2015-12-24 DIAGNOSIS — E785 Hyperlipidemia, unspecified: Secondary | ICD-10-CM

## 2015-12-24 DIAGNOSIS — Z1231 Encounter for screening mammogram for malignant neoplasm of breast: Secondary | ICD-10-CM | POA: Diagnosis not present

## 2015-12-24 DIAGNOSIS — Z1239 Encounter for other screening for malignant neoplasm of breast: Secondary | ICD-10-CM | POA: Diagnosis not present

## 2015-12-24 DIAGNOSIS — I10 Essential (primary) hypertension: Secondary | ICD-10-CM

## 2015-12-24 DIAGNOSIS — E039 Hypothyroidism, unspecified: Secondary | ICD-10-CM | POA: Diagnosis not present

## 2015-12-24 LAB — COMPLETE METABOLIC PANEL WITH GFR
ALBUMIN: 4.5 g/dL (ref 3.6–5.1)
ALK PHOS: 70 U/L (ref 33–130)
ALT: 20 U/L (ref 6–29)
AST: 23 U/L (ref 10–35)
BUN: 15 mg/dL (ref 7–25)
CO2: 29 mmol/L (ref 20–31)
Calcium: 9.8 mg/dL (ref 8.6–10.4)
Chloride: 102 mmol/L (ref 98–110)
Creat: 0.64 mg/dL (ref 0.50–0.99)
GFR, Est African American: >89 mL/min (ref >=60)
GFR, Est Non African American: >89 mL/min (ref >=60)
GLUCOSE: 88 mg/dL (ref 65–99)
POTASSIUM: 3.9 mmol/L (ref 3.5–5.3)
SODIUM: 138 mmol/L (ref 135–146)
Total Bilirubin: 0.7 mg/dL (ref 0.2–1.2)
Total Protein: 6.8 g/dL (ref 6.1–8.1)

## 2015-12-24 LAB — LIPID PANEL
CHOL/HDL RATIO: 2 ratio (ref <=5.0)
Cholesterol: 162 mg/dL (ref 125–200)
HDL: 83 mg/dL (ref >=46)
LDL Cholesterol: 67 mg/dL (ref <130)
Triglycerides: 60 mg/dL (ref <150)
VLDL: 12 mg/dL (ref <30)

## 2015-12-24 LAB — TSH: TSH: 2.135 u[IU]/mL (ref 0.350–4.500)

## 2015-12-24 MED ORDER — LEVOTHYROXINE SODIUM 25 MCG PO TABS: ORAL_TABLET | ORAL | Status: DC

## 2015-12-24 MED ORDER — ATORVASTATIN CALCIUM 40 MG PO TABS: ORAL_TABLET | ORAL | Status: DC

## 2015-12-24 MED ORDER — BENAZEPRIL HCL 10 MG PO TABS: ORAL_TABLET | ORAL | Status: DC

## 2015-12-24 NOTE — Progress Notes (Signed)
Subjective:  By signing my name below, I, Moises Blood, attest that this documentation has been prepared under the direction and in the presence of [name], MD. Electronically Signed: Moises Blood, [place]. 12/24/2015 , 9:46 AM .  Patient was seen in Room 27 .   Patient ID: Doris Wilkerson, female    DOB: 1946/04/10, 70 y.o.   MRN: [id number] Chief Complaint  Patient presents with  . Follow-up  . Hypertension    off beta blocker/metoprolol  . Medication Refill   HPI Doris Wilkerson is a 70 y.o. female Here for follow up.   HTN With history of CAD and HLD, followed by Dr. Einar Gip. Last visit Sep 24, 2015. BP was stable at that time. She was taking lopressor 25mg  1 half tablet bid. BP 134/85 at that visit.  Wt Readings from Last 3 Encounters:  12/24/15 157 lb (71.215 kg)  09/24/15 153 lb (69.4 kg)  05/14/15 154 lb 9.6 oz (70.126 kg)   Lab Results  Component Value Date   CHOL 130 05/14/2015   HDL 47 05/14/2015   LDLCALC 72 05/14/2015   TRIG 57 05/14/2015   CHOLHDL 2.8 05/14/2015   Lab Results  Component Value Date   CREATININE 0.74 05/14/2015   Dr. Einar Gip took her off Lopressor due to her heart rate was too low (around 46 bpm). Her activity heart rate is higher than before. Her waking heart rate is higher too. One morning, she was able to check it once, around 80 bpm with palpitations one episode. She denies shortness of breath, and chest pain.   She still takes benazepril 10mg  qd.   Health maintenance Mammogram: She reports her last mammogram done in early 2015. Her mother had breast cancer, around 15 years old.  Colonoscopy: She reports her last colonoscopy was about 9 years ago.   Patient Active Problem List   Diagnosis Date Noted  . Pain in joint, ankle and foot 02/14/2013  . Foot fracture 02/14/2013  . CAD (coronary artery disease) 12/09/2011  . Dyslipidemia 12/09/2011  . Osteopenia 12/09/2011  . Hypothyroid 12/09/2011  . Asthma 12/09/2011   Past Medical  History  Diagnosis Date  . Thyroid disease   . Hyperlipidemia   . Hypertension    Past Surgical History  Procedure Laterality Date  . Tonsillectomy  1951  . Orthopedic surgery  2012  . Eye surgery     Allergies  Allergen Reactions  . Plavix [Clopidogrel Bisulfate]    Prior to Admission medications   Medication Sig Start Date End Date Taking? Authorizing Provider  aspirin 81 MG tablet Take 81 mg by mouth daily.     Hayden Rasmussen, MD  atorvastatin (LIPITOR) 40 MG tablet Take 1 tablet by mouth  daily 09/24/15   [name], MD  benazepril (LOTENSIN) 10 MG tablet Take 1 tablet by mouth  daily 09/24/15   [name], MD  levothyroxine Wilmer Floor, LEVOTHROID) 25 MCG tablet Take 1 tablet by mouth  daily before breakfast 09/24/15   [name], MD  metoprolol tartrate (LOPRESSOR) 25 MG tablet Take one-half tablet by  mouth two times daily 09/24/15   [name], MD  OVER THE COUNTER MEDICATION Vitamin D 2000iu daily.    Historical Provider, MD   Social History   Social History  . Marital Status: Married    Spouse Name: N/A  . Number of Children: N/A  . Years of Education: N/A   Occupational History  . unemployed  Social History Main Topics  . Smoking status: Never Smoker   . Smokeless tobacco: Never Used  . Alcohol Use: 1.2 oz/week    2 Standard drinks or equivalent per week     Comment: 1  . Drug Use: No  . Sexual Activity: Not on file   Other Topics Concern  . Not on file   Social History Narrative   Married. Education: college. Pt does exercise-   Review of Systems  Constitutional: Negative for fatigue and unexpected weight change.  Respiratory: Negative for chest tightness and shortness of breath.   Cardiovascular: Negative for chest pain, palpitations and leg swelling.  Gastrointestinal: Negative for abdominal pain and blood in stool.  Neurological: Negative for dizziness, syncope, light-headedness and headaches.       Objective:    Physical Exam  Constitutional: She is oriented to person, place, and time. She appears well-developed and well-nourished.  HENT:  Head: Normocephalic and atraumatic.  Eyes: Conjunctivae and EOM are normal. Pupils are equal, round, and reactive to light.  Neck: Carotid bruit is not present.  Cardiovascular: Normal rate, regular rhythm, normal heart sounds and intact distal pulses.   Pulmonary/Chest: Effort normal and breath sounds normal.  Abdominal: Soft. She exhibits no pulsatile midline mass. There is no tenderness.  Neurological: She is alert and oriented to person, place, and time.  Skin: Skin is warm and dry.  Psychiatric: She has a normal mood and affect. Her behavior is normal.  Vitals reviewed.   Filed Vitals:   12/24/15 0859  BP: 109/70  Pulse: 73  Temp: 98.1 F (36.7 C)  Resp: 16  Height: 5' 6.5" (1.689 m)  Weight: 157 lb (71.215 kg)       Assessment & Plan:   Doris Wilkerson is a 70 y.o. female Hyperlipidemia - Plan: COMPLETE METABOLIC PANEL WITH GFR, Lipid panel, atorvastatin (LIPITOR) 40 MG tablet  -CMP, lipid panel pending. Continue Lipitor 40 mg daily for now.  Essential hypertension - Plan: benazepril (LOTENSIN) 10 MG tablet  -Stable. Continue Lotensin 10 mg daily. Labs pending as above.  Hypothyroidism, unspecified hypothyroidism type - Plan: TSH, levothyroxine (SYNTHROID, LEVOTHROID) 25 MCG tablet  -TSH pending. Continue Synthroid 25 MCG for now.  Breast cancer screening Visit for screening mammogram - Plan: MM Digital Screening  -Mammogram ordered.  Meds ordered this encounter  Medications  . atorvastatin (LIPITOR) 40 MG tablet    Sig: Take 1 tablet by mouth  daily    Dispense:  90 tablet    Refill:  1  . benazepril (LOTENSIN) 10 MG tablet    Sig: Take 1 tablet by mouth  daily    Dispense:  90 tablet    Refill:  1  . levothyroxine (SYNTHROID, LEVOTHROID) 25 MCG tablet    Sig: Take 1 tablet by mouth  daily before breakfast    Dispense:  90 tablet      Refill:  1   Patient Instructions  You should receive a call or letter about your lab results within the next week to 10 days.     I personally performed the services described in this documentation, which was scribed in my presence. The recorded information has been reviewed and considered, and addended by me as needed.

## 2015-12-24 NOTE — Patient Instructions (Signed)
You should receive a call or letter about your lab results within the next week to 10 days.   

## 2015-12-27 ENCOUNTER — Other Ambulatory Visit: Payer: Self-pay

## 2015-12-27 DIAGNOSIS — E785 Hyperlipidemia, unspecified: Secondary | ICD-10-CM

## 2015-12-27 DIAGNOSIS — I10 Essential (primary) hypertension: Secondary | ICD-10-CM

## 2015-12-27 DIAGNOSIS — E039 Hypothyroidism, unspecified: Secondary | ICD-10-CM

## 2015-12-27 MED ORDER — LEVOTHYROXINE SODIUM 25 MCG PO TABS: ORAL_TABLET | ORAL | Status: DC

## 2015-12-27 MED ORDER — BENAZEPRIL HCL 10 MG PO TABS: ORAL_TABLET | ORAL | Status: DC

## 2015-12-27 MED ORDER — ATORVASTATIN CALCIUM 40 MG PO TABS: ORAL_TABLET | ORAL | Status: DC

## 2015-12-28 ENCOUNTER — Encounter: Payer: Self-pay | Admitting: Family Medicine

## 2016-01-22 ENCOUNTER — Ambulatory Visit
Admission: RE | Admit: 2016-01-22 | Discharge: 2016-01-22 | Disposition: A | Discharge status: Home / Self Care | Payer: Medicare Other | Source: Ambulatory Visit | Attending: Family Medicine | Admitting: Family Medicine

## 2016-01-22 DIAGNOSIS — Z1231 Encounter for screening mammogram for malignant neoplasm of breast: Secondary | ICD-10-CM | POA: Diagnosis not present

## 2016-03-23 ENCOUNTER — Ambulatory Visit (INDEPENDENT_AMBULATORY_CARE_PROVIDER_SITE_OTHER): Payer: Medicare Other | Admitting: Family Medicine

## 2016-03-23 VITALS — BP 120/72 | HR 65 | Temp 97.9°F | Resp 12 | Ht 66.0 in | Wt 156.0 lb

## 2016-03-23 DIAGNOSIS — H1013 Acute atopic conjunctivitis, bilateral: Secondary | ICD-10-CM

## 2016-03-23 DIAGNOSIS — Z91038 Other insect allergy status: Secondary | ICD-10-CM

## 2016-03-23 NOTE — Patient Instructions (Addendum)
1.  Recommend Zyrtec 3m or Claritin 181mone tablet daily for insect bite reaction. 2.  Continue topical ointments as you have been using. 3.  Return if you develop pain at site of bite.    Bee, Wasp, or [employer] Cow[employer]ps, and hornets are part of a family of insects that can sting people. These stings can cause pain and inflammation, but they are usually not serious. However, some people may have an allergic reaction to a sting. This can cause the symptoms to be more severe.  SYMPTOMS  Common symptoms of this condition include:   A red lump in the skin that sometimes has a tiny hole in the center. In some cases, a stinger may be in the center of the wound.  Pain and itching at the sting site.  Redness and swelling around the sting site. If you have an allergic reaction (localized allergic reaction), the swelling and redness may spread out from the sting site. In some cases, this reaction can continue to develop over the next 12-36 hours. In rare cases, a person may have a severe allergic reaction (anaphylactic reaction) to a sting. Symptoms of an anaphylactic reaction may include:   Wheezing or difficulty breathing.  Raised, itchy, red patches on the skin.  Nausea or vomiting.  Abdominal cramping.  Diarrhea.  Chest pain.  Fainting.  Redness of the face (flushing). DIAGNOSIS  This condition is usually diagnosed based on symptoms, medical history, and a physical exam. TREATMENT  Most stings can be treated with:   Icing to reduce swelling.  Medicines (antihistamines) to treat itching or an allergic reaction.  Medicines to help reduce pain. These may be medicines that you take by mouth, or medicated creams or lotions that you apply to your skin. If you were stung by a bee, the stinger and a small sac of poison may be in the wound. This may be removed by brushing across it with a flat card, such as a credit card. Another method is to pinch the area and pull it out. These  methods can help reduce the severity of the body's reaction to the sting.  HOME CARE INSTRUCTIONS   Wash the sting site daily with soap and water as told by your health care provider.  Apply or take over-the-counter and prescription medicines only as told by your health care provider.  If directed, apply ice to the sting area.  Put ice in a plastic bag.  Place a towel between your skin and the bag.  Leave the ice on for 20 minutes, 2-3 times per day.  Do not scratch the sting area.  To lessen pain, try using a paste that is made of water and baking soda. Rub the paste on the sting area and leave it on for 5 minutes.  If you had a severe allergic reaction to a sting, you may need:  To wear a medical bracelet or necklace that lists the allergy.  To learn when and how to use an anaphylaxis kit or epinephrine injection. Your family members may also need to learn this.  To carry an anaphylaxis kit with you at all times. SEEK MEDICAL CARE IF:   Your symptoms do not get better in 2-3 days.  You have redness, swelling, or pain that spreads beyond the area of the sting.  You have a fever. SEEK IMMEDIATE MEDICAL CARE IF:  You have symptoms of a severe allergic reaction. These include:   Wheezing or difficulty breathing.  Chest pain.  Light-headedness or fainting.  Itchy, raised, red patches on the skin.  Nausea or vomiting.  Abdominal cramping.  Diarrhea.   This information is not intended to replace advice given to you by your health care provider. Make sure you discuss any questions you have with your health care provider.   Document Released: 11/10/2005 Document Revised: 08/01/2015 Document Reviewed: 03/28/2015 Elsevier Interactive Patient Education 2016 [employer].     IF you received an x-ray today, you will receive an invoice from [facility]. Please contact [facility] at [phone] with questions or concerns regarding your invoice.    IF you received labwork today, you will receive an invoice from [employer]. Please contact [facility] at [phone] with questions or concerns regarding your invoice.   Our billing staff will not be able to assist you with questions regarding bills from these companies.  You will be contacted with the lab results as soon as they are available. The fastest way to get your results is to activate your My Chart account. Instructions are located on the last page of this paperwork. If you have not heard from [place] regarding the results in 2 weeks, please contact this office.

## 2016-03-23 NOTE — Progress Notes (Signed)
Subjective:    Patient ID: Doris Wilkerson, female    DOB: January 29, 1946, 70 y.o.   MRN: [id number]  03/23/2016  Insect Bite   HPI This 70 y.o. female presents for evaluation of insect bite L forearm. Has been working in yard; suffered insect bite to L forearm.  Area had been itchy, redness, clear yellow drainage.  Non-tender.  Has taken oral Benadryl three times but causes sedation; has also been applying Neosporin, topical bethamethasone, and topical bendaryl.  No fever/chills/sweats.  Also suffered reaction to bandaid. Eyes were really watery a few weeks ago; feels suffering recently from allergies.    Review of Systems  Constitutional: Negative for fever, chills, diaphoresis and fatigue.  Skin: Positive for color change, rash and wound. Negative for pallor.    Past Medical History  Diagnosis Date  . Thyroid disease   . Hyperlipidemia   . Hypertension    Past Surgical History  Procedure Laterality Date  . Tonsillectomy  1951  . Orthopedic surgery  2012  . Eye surgery     Allergies  Allergen Reactions  . Plavix [Clopidogrel Bisulfate]    Current Outpatient Prescriptions  Medication Sig Dispense Refill  . aspirin 81 MG tablet Take 81 mg by mouth daily.     Marland Kitchen atorvastatin (LIPITOR) 40 MG tablet Take 1 tablet by mouth  daily 90 tablet 1  . benazepril (LOTENSIN) 10 MG tablet Take 1 tablet by mouth  daily 90 tablet 1  . levothyroxine (SYNTHROID, LEVOTHROID) 25 MCG tablet Take 1 tablet by mouth  daily before breakfast 90 tablet 1  . OVER THE COUNTER MEDICATION Vitamin D 2000iu daily.     No current facility-administered medications for this visit.   Social History   Social History  . Marital Status: Married    Spouse Name: N/A  . Number of Children: N/A  . Years of Education: N/A   Occupational History  . unemployed    Social History Main Topics  . Smoking status: Never Smoker   . Smokeless tobacco: Never Used  . Alcohol Use: 1.2 oz/week    2 Standard drinks or  equivalent per week     Comment: 1  . Drug Use: No  . Sexual Activity: Not on file   Other Topics Concern  . Not on file   Social History Narrative   Married. Education: college. Pt does exercise-   Family History  Problem Relation Age of Onset  . Cancer Father   . Heart disease Brother   . Dementia Brother        Objective:    BP 120/72 mmHg  Pulse 65  Temp(Src) 97.9 F (36.6 C) (Oral)  Resp 12  Ht 5\' 6"  (1.676 m)  Wt 156 lb (70.761 kg)  BMI 25.19 kg/m2  SpO2 100% Physical Exam  Constitutional: She appears well-developed and well-nourished. No distress.  HENT:  Head: Normocephalic and atraumatic.  Eyes: Conjunctivae and EOM are normal. Pupils are equal, round, and reactive to light.  Neck: Normal range of motion. Neck supple.  Cardiovascular: Normal rate and regular rhythm.   Pulmonary/Chest: Effort normal and breath sounds normal.  Skin: Skin is warm and dry. She is not diaphoretic. There is erythema.     Well healing wound along L forearm with minimal erythema; no associated tenderness; no active drainage.  Erythema linear 5 mm separate from central healing wound.   Results for orders placed or performed in visit on 12/24/15  COMPLETE METABOLIC PANEL WITH GFR  Result  Value Ref Range   Sodium 138 135 - 146 mmol/L   Potassium 3.9 3.5 - 5.3 mmol/L   Chloride 102 98 - 110 mmol/L   CO2 29 20 - 31 mmol/L   Glucose, Bld 88 65 - 99 mg/dL   BUN 15 7 - 25 mg/dL   Creat 0.64 0.50 - 0.99 mg/dL   Total Bilirubin 0.7 0.2 - 1.2 mg/dL   Alkaline Phosphatase 70 33 - 130 U/L   AST 23 10 - 35 U/L   ALT 20 6 - 29 U/L   Total Protein 6.8 6.1 - 8.1 g/dL   Albumin 4.5 3.6 - 5.1 g/dL   Calcium 9.8 8.6 - 10.4 mg/dL   GFR, Est African American >89 >=60 mL/min   GFR, Est Non African American >89 >=60 mL/min  Lipid panel  Result Value Ref Range   Cholesterol 162 125 - 200 mg/dL   Triglycerides 60 <150 mg/dL   HDL 83 >=46 mg/dL   Total CHOL/HDL Ratio 2.0 <=5.0 Ratio   VLDL  12 <30 mg/dL   LDL Cholesterol 67 <130 mg/dL  TSH  Result Value Ref Range   TSH 2.135 0.350 - 4.500 uIU/mL       Assessment & Plan:   1. Allergic to insect bites   2. Allergic conjunctivitis, bilateral    -New. -recommend Zyrtec or Claritin 10mg  daily during spring in summer months. -continue Benadryl qhs PRN. -continue topical steroid cream and benadryl cream. -reviewed s/s of secondary infection; RTC for pain, increasing redness, swelling.   No orders of the defined types were placed in this encounter.   No orders of the defined types were placed in this encounter.    No Follow-up on file.    Kyliegh Jester Elayne Guerin, M.D. [facility] [address] [place], [place]  [address] [phone] phone [phone] fax

## 2016-08-14 ENCOUNTER — Telehealth: Payer: Self-pay

## 2016-08-14 NOTE — Telephone Encounter (Signed)
Due for follow-up at anytime now. If temporary medication refills needed, can extend for another 3 months to allow her to schedule appointment.

## 2016-08-14 NOTE — Telephone Encounter (Signed)
Msg is for Doris Wilkerson, pt would like to know when her blood work is due. She was trying to schedule an appointment but wasn't sure.  Please advise  [phone]

## 2016-08-18 NOTE — Telephone Encounter (Signed)
Message left to call back

## 2016-08-21 ENCOUNTER — Ambulatory Visit: Payer: Medicare Other

## 2016-08-25 ENCOUNTER — Ambulatory Visit (INDEPENDENT_AMBULATORY_CARE_PROVIDER_SITE_OTHER): Payer: Medicare Other | Admitting: Family Medicine

## 2016-08-25 VITALS — BP 116/70 | HR 70 | Temp 97.8°F | Resp 18 | Ht 66.0 in | Wt 152.4 lb

## 2016-08-25 DIAGNOSIS — I10 Essential (primary) hypertension: Secondary | ICD-10-CM | POA: Diagnosis not present

## 2016-08-25 DIAGNOSIS — E782 Mixed hyperlipidemia: Secondary | ICD-10-CM | POA: Diagnosis not present

## 2016-08-25 DIAGNOSIS — E039 Hypothyroidism, unspecified: Secondary | ICD-10-CM

## 2016-08-25 DIAGNOSIS — R0789 Other chest pain: Secondary | ICD-10-CM

## 2016-08-25 DIAGNOSIS — Z23 Encounter for immunization: Secondary | ICD-10-CM

## 2016-08-25 DIAGNOSIS — I2510 Atherosclerotic heart disease of native coronary artery without angina pectoris: Secondary | ICD-10-CM

## 2016-08-25 LAB — COMPLETE METABOLIC PANEL WITH GFR
ALT: 19 U/L (ref 6–29)
AST: 24 U/L (ref 10–35)
Albumin: 4.6 g/dL (ref 3.6–5.1)
Alkaline Phosphatase: 65 U/L (ref 33–130)
BILIRUBIN TOTAL: 0.6 mg/dL (ref 0.2–1.2)
BUN: 15 mg/dL (ref 7–25)
CALCIUM: 10 mg/dL (ref 8.6–10.4)
CHLORIDE: 107 mmol/L (ref 98–110)
CO2: 27 mmol/L (ref 20–31)
Creat: 0.71 mg/dL (ref 0.60–0.93)
GFR, Est African American: >89 mL/min (ref >=60)
GFR, Est Non African American: 87 mL/min (ref >=60)
GLUCOSE: 98 mg/dL (ref 65–99)
POTASSIUM: 4.5 mmol/L (ref 3.5–5.3)
SODIUM: 141 mmol/L (ref 135–146)
Total Protein: 6.8 g/dL (ref 6.1–8.1)

## 2016-08-25 LAB — LIPID PANEL
CHOL/HDL RATIO: 2.1 ratio (ref <=5.0)
Cholesterol: 173 mg/dL (ref 125–200)
HDL: 82 mg/dL (ref >=46)
LDL CALC: 79 mg/dL (ref <130)
Triglycerides: 58 mg/dL (ref <150)
VLDL: 12 mg/dL (ref <30)

## 2016-08-25 LAB — TSH: TSH: 2.21 m[IU]/L

## 2016-08-25 MED ORDER — BENAZEPRIL HCL 10 MG PO TABS: ORAL_TABLET | ORAL | 2 refills | Status: DC

## 2016-08-25 MED ORDER — LEVOTHYROXINE SODIUM 25 MCG PO TABS: ORAL_TABLET | ORAL | 2 refills | Status: DC

## 2016-08-25 MED ORDER — ATORVASTATIN CALCIUM 40 MG PO TABS: ORAL_TABLET | ORAL | 2 refills | Status: DC

## 2016-08-25 NOTE — Progress Notes (Addendum)
By signing my name below, I, Mesha Guinyard, attest that this documentation has been prepared under the direction and in the presence of [name], MD.  Electronically Signed: Verlee Monte, Medical Scribe. 08/25/16. 10:29 AM.  Subjective:    Patient ID: Doris Wilkerson, female    DOB: Oct 24, 1946, 70 y.o.   MRN: [id number]  HPI Chief Complaint  Patient presents with  . Medication Refill    benazepril, atorvastatin   . Immunizations    flu shot     HPI Comments: Doris Wilkerson is a 70 y.o. female with a PMHx of HTN, HLD, asthma, CAD, and hypothyroidism presents to the [facility] for medication refill. Pt is fasting.  Hypothyroidism: Takes synthroid QD  HTN: Pt does not check her bp at home. Takes lotensin 10 mg QD. Pt reports chest pain while at rest beginning this summer. Pt suspects it's stress induced and reports she could be at a concern and she'll have to catch her breath. Pt's friend of 73 years recently passed. Pt has been doing over head military presses for the past month, and isn't sure if her CP started then. Pt goes to the gym to manage her stress. Pt mentions she's been doing a lot of yard work that requires her to turn and twist. Pt states her bp is up 20 points from 1 year ago from the cardiologist. Pt started drinking decaf coffee. When pt is biking and her pulse is 130-140 which is unusual since she's been on the beta blocker for a while. Pt denies experiencing any negative side effects on his medication such as light-headedness, dizziness, SOB, calf pain, exercised induced CP, and other symptoms. Lab Results  Component Value Date   CREATININE 0.64 12/24/2015   HLD: Takes lipitor.  Pt reports joint pain-knee and hip. Pt states she has had high cholesterol since the 70's. Pt denies experiencing any negative side effects on his medication such as light-headedness, dizziness, and other symptoms. Lab Results  Component Value Date   CHOL 162 12/24/2015   HDL 83 12/24/2015   LDLCALC 67 12/24/2015   TRIG 60 12/24/2015   CHOLHDL 2.0 12/24/2015   Lab Results  Component Value Date   ALT 20 12/24/2015   AST 23 12/24/2015   ALKPHOS 70 12/24/2015   BILITOT 0.7 12/24/2015   CAD: Cardiologist is Dr. Einar Gip. Hx of stent, no hx of corenary bypass. Pt reports having more energy since being off of the beta blocker.  Depression: Pt's best friend of 91 years who was 40 y/o has recently passed and his funeral is this week. Pt is sad for loosing her friend. Pt denies feelings of self worthlessness. Depression screen [facility] 2/9 03/23/2016 12/24/2015 05/14/2015 05/06/2015 04/28/2015  Decreased Interest 0 0 0 0 0  Down, Depressed, Hopeless 0 0 0 0 0  PHQ - 2 Score 0 0 0 0 0   Patient Active Problem List   Diagnosis Date Noted  . Pain in joint, ankle and foot 02/14/2013  . Foot fracture 02/14/2013  . CAD (coronary artery disease) 12/09/2011  . Dyslipidemia 12/09/2011  . Osteopenia 12/09/2011  . Hypothyroid 12/09/2011  . Asthma 12/09/2011   Past Medical History:  Diagnosis Date  . Hyperlipidemia   . Hypertension   . Thyroid disease    Past Surgical History:  Procedure Laterality Date  . EYE SURGERY    . ORTHOPEDIC SURGERY  2012  . TONSILLECTOMY  1951   Allergies  Allergen Reactions  .  Plavix [Clopidogrel Bisulfate]    Prior to Admission medications   Medication Sig Start Date End Date Taking? Authorizing Provider  aspirin 81 MG tablet Take 81 mg by mouth daily.    Yes Hayden Rasmussen, MD  atorvastatin (LIPITOR) 40 MG tablet Take 1 tablet by mouth  daily 12/27/15  Yes [name], MD  benazepril (LOTENSIN) 10 MG tablet Take 1 tablet by mouth  daily 12/27/15  Yes [name], MD  levothyroxine (SYNTHROID, LEVOTHROID) 25 MCG tablet Take 1 tablet by mouth  daily before breakfast 12/27/15  Yes [name], MD  OVER THE COUNTER MEDICATION Vitamin D 2000iu daily.   Yes Historical Provider, MD   Social History   Social History  . Marital  status: Married    Spouse name: N/A  . Number of children: N/A  . Years of education: N/A   Occupational History  . unemployed    Social History Main Topics  . Smoking status: Never Smoker  . Smokeless tobacco: Never Used  . Alcohol use 1.2 oz/week    2 Standard drinks or equivalent per week     Comment: 1  . Drug use: No  . Sexual activity: Not on file   Other Topics Concern  . Not on file   Social History Narrative   Married. Education: college. Pt does exercise-   Review of Systems  Constitutional: Negative for fatigue and unexpected weight change.  Respiratory: Negative for chest tightness and shortness of breath.   Cardiovascular: Positive for chest pain. Negative for palpitations and leg swelling.  Gastrointestinal: Negative for abdominal pain and blood in stool.  Musculoskeletal: Positive for myalgias.  Neurological: Negative for dizziness, syncope, light-headedness and headaches.    Objective:  Physical Exam  Constitutional: She is oriented to person, place, and time. She appears well-developed and well-nourished.  HENT:  Head: Normocephalic and atraumatic.  Eyes: Conjunctivae and EOM are normal. Pupils are equal, round, and reactive to light.  Neck: Carotid bruit is not present.  Cardiovascular: Normal rate, regular rhythm, normal heart sounds and intact distal pulses.   Pulmonary/Chest: Effort normal and breath sounds normal. She exhibits no tenderness.  Chest wall non tender on exam  Abdominal: Soft. She exhibits no pulsatile midline mass. There is no tenderness.  Musculoskeletal: She exhibits no edema or tenderness.  Calves non tender, no edema  Neurological: She is alert and oriented to person, place, and time.  Skin: Skin is warm and dry.  Psychiatric: She has a normal mood and affect. Her behavior is normal.  Vitals reviewed.  BP 116/70   Pulse 70   Temp 97.8 F (36.6 C) (Oral)   Resp 18   Ht '5\' 6"'  (1.676 m)   Wt 152 lb 6.4 oz (69.1 kg)   SpO2  100%   BMI 24.60 kg/m   EKG Reading: Sinus brady cardia. rate 52. LAFB, No acute findings. Discussed with cardiologist, appears to be LVH. No acute findings  Assessment & Plan:    Doris Wilkerson is a 70 y.o. female Essential hypertension - Plan: EKG 12-Lead, COMPLETE METABOLIC PANEL WITH GFR, benazepril (LOTENSIN) 10 MG tablet  - Stable. Check CMP, continue same dose of benazepril. Feels better off of beta blocker, so did not restart that today.  Hypothyroidism, unspecified type - Plan: TSH, levothyroxine (SYNTHROID, LEVOTHROID) 25 MCG tablet  - Check TSH, continue same dose of Synthroid.  Other chest pain Coronary artery disease involving native coronary artery of native heart without angina pectoris -  Plan: Lipid panel  - Suspected chest wall pain, and may be related to upper body exercises/deltoid exercises. Advised to abstain from upper body exercises for 1 week to see if pain resolves. No concerning findings on EKG. RTC/ER precautions and continue routine follow-up with cardiologist.  - Also discussed stress as possible contributor. Stress and stress management information given, RTC precautions.  Hyperlipemia, mixed - Plan: atorvastatin (LIPITOR) 40 MG tablet, Mixed hyperlipidemia - Plan: atorvastatin (LIPITOR) 40 MG tablet  - Continue Lipitor as tolerating at current dose.  Needs flu shot - Plan: Flu Vaccine QUAD 36+ mos IM  - Flu vaccine given.  Meds ordered this encounter  Medications  . atorvastatin (LIPITOR) 40 MG tablet    Sig: Take 1 tablet by mouth  daily    Dispense:  90 tablet    Refill:  2  . benazepril (LOTENSIN) 10 MG tablet    Sig: Take 1 tablet by mouth  daily    Dispense:  90 tablet    Refill:  2  . levothyroxine (SYNTHROID, LEVOTHROID) 25 MCG tablet    Sig: Take 1 tablet by mouth  daily before breakfast    Dispense:  90 tablet    Refill:  2   Patient Instructions    See information on chest pain below. Based on your description, it appears to be chest  wall pain, but follow up with your cardiologist as planned. Try to decrease the upper body exercises including shoulder exercises for the next 1 week to see if that helps. If pain does not improve with those changes, would want you seen by Dr. Einar Gip sooner.   Return to the clinic or go to the nearest emergency room if any of your symptoms worsen or new symptoms occur.  I also included some information on stress and stress management below. If you feel like you are having difficulty with anxiety or depression symptoms, please return and discuss those further with me.   Stress and Stress Management Stress is a normal reaction to life events. It is what you feel when life demands more than you are used to or more than you can handle. Some stress can be useful. For example, the stress reaction can help you catch the last bus of the day, study for a test, or meet a deadline at work. But stress that occurs too often or for too long can cause problems. It can affect your emotional health and interfere with relationships and normal daily activities. Too much stress can weaken your immune system and increase your risk for physical illness. If you already have a medical problem, stress can make it worse. CAUSES  All sorts of life events may cause stress. An event that causes stress for one person may not be stressful for another person. Major life events commonly cause stress. These may be positive or negative. Examples include losing your job, moving into a new home, getting married, having a baby, or losing a loved one. Less obvious life events may also cause stress, especially if they occur day after day or in combination. Examples include working long hours, driving in traffic, caring for children, being in debt, or being in a difficult relationship. SIGNS AND SYMPTOMS Stress may cause emotional symptoms including, the following:  Anxiety. This is feeling worried, afraid, on edge, overwhelmed, or out of  control.  Anger. This is feeling irritated or impatient.  Depression. This is feeling sad, down, helpless, or guilty.  Difficulty focusing, remembering, or making decisions. Stress  may cause physical symptoms, including the following:   Aches and pains. These may affect your head, neck, back, stomach, or other areas of your body.  Tight muscles or clenched jaw.  Low energy or trouble sleeping. Stress may cause unhealthy behaviors, including the following:   Eating to feel better (overeating) or skipping meals.  Sleeping too little, too much, or both.  Working too much or putting off tasks (procrastination).  Smoking, drinking alcohol, or using drugs to feel better. DIAGNOSIS  Stress is diagnosed through an assessment by your health care provider. Your health care provider will ask questions about your symptoms and any stressful life events.Your health care provider will also ask about your medical history and may order blood tests or other tests. Certain medical conditions and medicine can cause physical symptoms similar to stress. Mental illness can cause emotional symptoms and unhealthy behaviors similar to stress. Your health care provider may refer you to a mental health professional for further evaluation.  TREATMENT  Stress management is the recommended treatment for stress.The goals of stress management are reducing stressful life events and coping with stress in healthy ways.  Techniques for reducing stressful life events include the following:  Stress identification. Self-monitor for stress and identify what causes stress for you. These skills may help you to avoid some stressful events.  Time management. Set your priorities, keep a calendar of events, and learn to say "no." These tools can help you avoid making too many commitments. Techniques for coping with stress include the following:  Rethinking the problem. Try to think realistically about stressful events rather  than ignoring them or overreacting. Try to find the positives in a stressful situation rather than focusing on the negatives.  Exercise. Physical exercise can release both physical and emotional tension. The key is to find a form of exercise you enjoy and do it regularly.  Relaxation techniques. These relax the body and mind. Examples include yoga, meditation, tai chi, biofeedback, deep breathing, progressive muscle relaxation, listening to music, being out in nature, journaling, and other hobbies. Again, the key is to find one or more that you enjoy and can do regularly.  Healthy lifestyle. Eat a balanced diet, get plenty of sleep, and do not smoke. Avoid using alcohol or drugs to relax.  Strong support network. Spend time with family, friends, or other people you enjoy being around.Express your feelings and talk things over with someone you trust. Counseling or talktherapy with a mental health professional may be helpful if you are having difficulty managing stress on your own. Medicine is typically not recommended for the treatment of stress.Talk to your health care provider if you think you need medicine for symptoms of stress. HOME CARE INSTRUCTIONS  Keep all follow-up visits as directed by your health care provider.  Take all medicines as directed by your health care provider. SEEK MEDICAL CARE IF:  Your symptoms get worse or you start having new symptoms.  You feel overwhelmed by your problems and can no longer manage them on your own. SEEK IMMEDIATE MEDICAL CARE IF:  You feel like hurting yourself or someone else.   This information is not intended to replace advice given to you by your health care provider. Make sure you discuss any questions you have with your health care provider.   Document Released: 05/06/2001 Document Revised: 12/01/2014 Document Reviewed: 07/05/2013 Elsevier Interactive Patient Education 2016 Elsevier Inc.  Nonspecific Chest Pain  Chest pain can be  caused by many different conditions. There is  always a chance that your pain could be related to something serious, such as a heart attack or a blood clot in your lungs. Chest pain can also be caused by conditions that are not life-threatening. If you have chest pain, it is very important to follow up with your health care provider. CAUSES  Chest pain can be caused by:  Heartburn.  Pneumonia or bronchitis.  Anxiety or stress.  Inflammation around your heart (pericarditis) or lung (pleuritis or pleurisy).  A blood clot in your lung.  A collapsed lung (pneumothorax). It can develop suddenly on its own (spontaneous pneumothorax) or from trauma to the chest.  Shingles infection (varicella-zoster virus).  Heart attack.  Damage to the bones, muscles, and cartilage that make up your chest wall. This can include:  Bruised bones due to injury.  Strained muscles or cartilage due to frequent or repeated coughing or overwork.  Fracture to one or more ribs.  Sore cartilage due to inflammation (costochondritis). RISK FACTORS  Risk factors for chest pain may include:  Activities that increase your risk for trauma or injury to your chest.  Respiratory infections or conditions that cause frequent coughing.  Medical conditions or overeating that can cause heartburn.  Heart disease or family history of heart disease.  Conditions or health behaviors that increase your risk of developing a blood clot.  Having had chicken pox (varicella zoster). SIGNS AND SYMPTOMS Chest pain can feel like:  Burning or tingling on the surface of your chest or deep in your chest.  Crushing, pressure, aching, or squeezing pain.  Dull or sharp pain that is worse when you move, cough, or take a deep breath.  Pain that is also felt in your back, neck, shoulder, or arm, or pain that spreads to any of these areas. Your chest pain may come and go, or it may stay constant. DIAGNOSIS Lab tests or other studies  may be needed to find the cause of your pain. Your health care provider may have you take a test called an ambulatory ECG (electrocardiogram). An ECG records your heartbeat patterns at the time the test is performed. You may also have other tests, such as:  Transthoracic echocardiogram (TTE). During echocardiography, sound waves are used to create a picture of all of the heart structures and to look at how blood flows through your heart.  Transesophageal echocardiogram (TEE).This is a more advanced imaging test that obtains images from inside your body. It allows your health care provider to see your heart in finer detail.  Cardiac monitoring. This allows your health care provider to monitor your heart rate and rhythm in real time.  Holter monitor. This is a portable device that records your heartbeat and can help to diagnose abnormal heartbeats. It allows your health care provider to track your heart activity for several days, if needed.  Stress tests. These can be done through exercise or by taking medicine that makes your heart beat more quickly.  Blood tests.  Imaging tests. TREATMENT  Your treatment depends on what is causing your chest pain. Treatment may include:  Medicines. These may include:  Acid blockers for heartburn.  Anti-inflammatory medicine.  Pain medicine for inflammatory conditions.  Antibiotic medicine, if an infection is present.  Medicines to dissolve blood clots.  Medicines to treat coronary artery disease.  Supportive care for conditions that do not require medicines. This may include:  Resting.  Applying heat or cold packs to injured areas.  Limiting activities until pain decreases. HOME CARE INSTRUCTIONS  If you were prescribed an antibiotic medicine, finish it all even if you start to feel better.  Avoid any activities that bring on chest pain.  Do not use any tobacco products, including cigarettes, chewing tobacco, or electronic cigarettes. If  you need help quitting, ask your health care provider.  Do not drink alcohol.  Take medicines only as directed by your health care provider.  Keep all follow-up visits as directed by your health care provider. This is important. This includes any further testing if your chest pain does not go away.  If heartburn is the cause for your chest pain, you may be told to keep your head raised (elevated) while sleeping. This reduces the chance that acid will go from your stomach into your esophagus.  Make lifestyle changes as directed by your health care provider. These may include:  Getting regular exercise. Ask your health care provider to suggest some activities that are safe for you.  Eating a heart-healthy diet. A registered dietitian can help you to learn healthy eating options.  Maintaining a healthy weight.  Managing diabetes, if necessary.  Reducing stress. SEEK MEDICAL CARE IF:  Your chest pain does not go away after treatment.  You have a rash with blisters on your chest.  You have a fever. SEEK IMMEDIATE MEDICAL CARE IF:   Your chest pain is worse.  You have an increasing cough, or you cough up blood.  You have severe abdominal pain.  You have severe weakness.  You faint.  You have chills.  You have sudden, unexplained chest discomfort.  You have sudden, unexplained discomfort in your arms, back, neck, or jaw.  You have shortness of breath at any time.  You suddenly start to sweat, or your skin gets clammy.  You feel nauseous or you vomit.  You suddenly feel light-headed or dizzy.  Your heart begins to beat quickly, or it feels like it is skipping beats. These symptoms may represent a serious problem that is an emergency. Do not wait to see if the symptoms will go away. Get medical help right away. Call your local emergency services (911 in the [place] Do not drive yourself to the hospital.   This information is not intended to replace advice given to you  by your health care provider. Make sure you discuss any questions you have with your health care provider.   Document Released: 08/20/2005 Document Revised: 12/01/2014 Document Reviewed: 06/16/2014 Elsevier Interactive Patient Education 2016 Elsevier Inc.  Chest Wall Pain Chest wall pain is pain in or around the bones and muscles of your chest. Sometimes, an injury causes this pain. Sometimes, the cause may not be known. This pain may take several weeks or longer to get better. HOME CARE INSTRUCTIONS  Pay attention to any changes in your symptoms. Take these actions to help with your pain:   Rest as told by your health care provider.   Avoid activities that cause pain. These include any activities that use your chest muscles or your abdominal and side muscles to lift heavy items.   If directed, apply ice to the painful area:  Put ice in a plastic bag.  Place a towel between your skin and the bag.  Leave the ice on for 20 minutes, 2-3 times per day.  Take over-the-counter and prescription medicines only as told by your health care provider.  Do not use tobacco products, including cigarettes, chewing tobacco, and e-cigarettes. If you need help quitting, ask your health care provider.  Keep  all follow-up visits as told by your health care provider. This is important. SEEK MEDICAL CARE IF:  You have a fever.  Your chest pain becomes worse.  You have new symptoms. SEEK IMMEDIATE MEDICAL CARE IF:  You have nausea or vomiting.  You feel sweaty or light-headed.  You have a cough with phlegm (sputum) or you cough up blood.  You develop shortness of breath.   This information is not intended to replace advice given to you by your health care provider. Make sure you discuss any questions you have with your health care provider.   Document Released: 11/10/2005 Document Revised: 08/01/2015 Document Reviewed: 02/05/2015 Elsevier Interactive Patient Education 2016 [employer].    IF you received an x-ray today, you will receive an invoice from [facility]. Please contact [facility] at [phone] with questions or concerns regarding your invoice.   IF you received labwork today, you will receive an invoice from [employer]. Please contact [facility] at [phone] with questions or concerns regarding your invoice.   Our billing staff will not be able to assist you with questions regarding bills from these companies.  You will be contacted with the lab results as soon as they are available. The fastest way to get your results is to activate your My Chart account. Instructions are located on the last page of this paperwork. If you have not heard from [place] regarding the results in 2 weeks, please contact this office.        I personally performed the services described in this documentation, which was scribed in my presence. The recorded information has been reviewed and considered, and addended by me as needed.   Signed,   [name], MD [facility].  08/25/16 7:00 PM

## 2016-08-25 NOTE — Patient Instructions (Addendum)
See information on chest pain below. Based on your description, it appears to be chest wall pain, but follow up with your cardiologist as planned. Try to decrease the upper body exercises including shoulder exercises for the next 1 week to see if that helps. If pain does not improve with those changes, would want you seen by Dr. Einar Gip sooner.   Return to the clinic or go to the nearest emergency room if any of your symptoms worsen or new symptoms occur.  I also included some information on stress and stress management below. If you feel like you are having difficulty with anxiety or depression symptoms, please return and discuss those further with me.   Stress and Stress Management Stress is a normal reaction to life events. It is what you feel when life demands more than you are used to or more than you can handle. Some stress can be useful. For example, the stress reaction can help you catch the last bus of the day, study for a test, or meet a deadline at work. But stress that occurs too often or for too long can cause problems. It can affect your emotional health and interfere with relationships and normal daily activities. Too much stress can weaken your immune system and increase your risk for physical illness. If you already have a medical problem, stress can make it worse. CAUSES  All sorts of life events may cause stress. An event that causes stress for one person may not be stressful for another person. Major life events commonly cause stress. These may be positive or negative. Examples include losing your job, moving into a new home, getting married, having a baby, or losing a loved one. Less obvious life events may also cause stress, especially if they occur day after day or in combination. Examples include working long hours, driving in traffic, caring for children, being in debt, or being in a difficult relationship. SIGNS AND SYMPTOMS Stress may cause emotional symptoms including, the  following:  Anxiety. This is feeling worried, afraid, on edge, overwhelmed, or out of control.  Anger. This is feeling irritated or impatient.  Depression. This is feeling sad, down, helpless, or guilty.  Difficulty focusing, remembering, or making decisions. Stress may cause physical symptoms, including the following:   Aches and pains. These may affect your head, neck, back, stomach, or other areas of your body.  Tight muscles or clenched jaw.  Low energy or trouble sleeping. Stress may cause unhealthy behaviors, including the following:   Eating to feel better (overeating) or skipping meals.  Sleeping too little, too much, or both.  Working too much or putting off tasks (procrastination).  Smoking, drinking alcohol, or using drugs to feel better. DIAGNOSIS  Stress is diagnosed through an assessment by your health care provider. Your health care provider will ask questions about your symptoms and any stressful life events.Your health care provider will also ask about your medical history and may order blood tests or other tests. Certain medical conditions and medicine can cause physical symptoms similar to stress. Mental illness can cause emotional symptoms and unhealthy behaviors similar to stress. Your health care provider may refer you to a mental health professional for further evaluation.  TREATMENT  Stress management is the recommended treatment for stress.The goals of stress management are reducing stressful life events and coping with stress in healthy ways.  Techniques for reducing stressful life events include the following:  Stress identification. Self-monitor for stress and identify what causes stress for you.  These skills may help you to avoid some stressful events.  Time management. Set your priorities, keep a calendar of events, and learn to say "no." These tools can help you avoid making too many commitments. Techniques for coping with stress include the  following:  Rethinking the problem. Try to think realistically about stressful events rather than ignoring them or overreacting. Try to find the positives in a stressful situation rather than focusing on the negatives.  Exercise. Physical exercise can release both physical and emotional tension. The key is to find a form of exercise you enjoy and do it regularly.  Relaxation techniques. These relax the body and mind. Examples include yoga, meditation, tai chi, biofeedback, deep breathing, progressive muscle relaxation, listening to music, being out in nature, journaling, and other hobbies. Again, the key is to find one or more that you enjoy and can do regularly.  Healthy lifestyle. Eat a balanced diet, get plenty of sleep, and do not smoke. Avoid using alcohol or drugs to relax.  Strong support network. Spend time with family, friends, or other people you enjoy being around.Express your feelings and talk things over with someone you trust. Counseling or talktherapy with a mental health professional may be helpful if you are having difficulty managing stress on your own. Medicine is typically not recommended for the treatment of stress.Talk to your health care provider if you think you need medicine for symptoms of stress. HOME CARE INSTRUCTIONS  Keep all follow-up visits as directed by your health care provider.  Take all medicines as directed by your health care provider. SEEK MEDICAL CARE IF:  Your symptoms get worse or you start having new symptoms.  You feel overwhelmed by your problems and can no longer manage them on your own. SEEK IMMEDIATE MEDICAL CARE IF:  You feel like hurting yourself or someone else.   This information is not intended to replace advice given to you by your health care provider. Make sure you discuss any questions you have with your health care provider.   Document Released: 05/06/2001 Document Revised: 12/01/2014 Document Reviewed: 07/05/2013 Elsevier  Interactive Patient Education 2016 [employer] Inc[employer]  Nonspecific Chest Pain  Chest pain can be caused by many different conditions. There is always a chance that your pain could be related to something serious, such as a heart attack or a blood clot in your lungs. Chest pain can also be caused by conditions that are not life-threatening. If you have chest pain, it is very important to follow up with your health care provider. CAUSES  Chest pain can be caused by:  Heartburn.  Pneumonia or bronchitis.  Anxiety or stress.  Inflammation around your heart (pericarditis) or lung (pleuritis or pleurisy).  A blood clot in your lung.  A collapsed lung (pneumothorax). It can develop suddenly on its own (spontaneous pneumothorax) or from trauma to the chest.  Shingles infection (varicella-zoster virus).  Heart attack.  Damage to the bones, muscles, and cartilage that make up your chest wall. This can include:  Bruised bones due to injury.  Strained muscles or cartilage due to frequent or repeated coughing or overwork.  Fracture to one or more ribs.  Sore cartilage due to inflammation (costochondritis). RISK FACTORS  Risk factors for chest pain may include:  Activities that increase your risk for trauma or injury to your chest.  Respiratory infections or conditions that cause frequent coughing.  Medical conditions or overeating that can cause heartburn.  Heart disease or family history of heart disease.  Conditions or health behaviors that increase your risk of developing a blood clot.  Having had chicken pox (varicella zoster). SIGNS AND SYMPTOMS Chest pain can feel like:  Burning or tingling on the surface of your chest or deep in your chest.  Crushing, pressure, aching, or squeezing pain.  Dull or sharp pain that is worse when you move, cough, or take a deep breath.  Pain that is also felt in your back, neck, shoulder, or arm, or pain that spreads to any of these  areas. Your chest pain may come and go, or it may stay constant. DIAGNOSIS Lab tests or other studies may be needed to find the cause of your pain. Your health care provider may have you take a test called an ambulatory ECG (electrocardiogram). An ECG records your heartbeat patterns at the time the test is performed. You may also have other tests, such as:  Transthoracic echocardiogram (TTE). During echocardiography, sound waves are used to create a picture of all of the heart structures and to look at how blood flows through your heart.  Transesophageal echocardiogram (TEE).This is a more advanced imaging test that obtains images from inside your body. It allows your health care provider to see your heart in finer detail.  Cardiac monitoring. This allows your health care provider to monitor your heart rate and rhythm in real time.  Holter monitor. This is a portable device that records your heartbeat and can help to diagnose abnormal heartbeats. It allows your health care provider to track your heart activity for several days, if needed.  Stress tests. These can be done through exercise or by taking medicine that makes your heart beat more quickly.  Blood tests.  Imaging tests. TREATMENT  Your treatment depends on what is causing your chest pain. Treatment may include:  Medicines. These may include:  Acid blockers for heartburn.  Anti-inflammatory medicine.  Pain medicine for inflammatory conditions.  Antibiotic medicine, if an infection is present.  Medicines to dissolve blood clots.  Medicines to treat coronary artery disease.  Supportive care for conditions that do not require medicines. This may include:  Resting.  Applying heat or cold packs to injured areas.  Limiting activities until pain decreases. HOME CARE INSTRUCTIONS  If you were prescribed an antibiotic medicine, finish it all even if you start to feel better.  Avoid any activities that bring on chest  pain.  Do not use any tobacco products, including cigarettes, chewing tobacco, or electronic cigarettes. If you need help quitting, ask your health care provider.  Do not drink alcohol.  Take medicines only as directed by your health care provider.  Keep all follow-up visits as directed by your health care provider. This is important. This includes any further testing if your chest pain does not go away.  If heartburn is the cause for your chest pain, you may be told to keep your head raised (elevated) while sleeping. This reduces the chance that acid will go from your stomach into your esophagus.  Make lifestyle changes as directed by your health care provider. These may include:  Getting regular exercise. Ask your health care provider to suggest some activities that are safe for you.  Eating a heart-healthy diet. A registered dietitian can help you to learn healthy eating options.  Maintaining a healthy weight.  Managing diabetes, if necessary.  Reducing stress. SEEK MEDICAL CARE IF:  Your chest pain does not go away after treatment.  You have a rash with blisters on your chest.  You have a fever. SEEK IMMEDIATE MEDICAL CARE IF:   Your chest pain is worse.  You have an increasing cough, or you cough up blood.  You have severe abdominal pain.  You have severe weakness.  You faint.  You have chills.  You have sudden, unexplained chest discomfort.  You have sudden, unexplained discomfort in your arms, back, neck, or jaw.  You have shortness of breath at any time.  You suddenly start to sweat, or your skin gets clammy.  You feel nauseous or you vomit.  You suddenly feel light-headed or dizzy.  Your heart begins to beat quickly, or it feels like it is skipping beats. These symptoms may represent a serious problem that is an emergency. Do not wait to see if the symptoms will go away. Get medical help right away. Call your local emergency services (911 in the  [place] Do not drive yourself to the hospital.   This information is not intended to replace advice given to you by your health care provider. Make sure you discuss any questions you have with your health care provider.   Document Released: 08/20/2005 Document Revised: 12/01/2014 Document Reviewed: 06/16/2014 Elsevier Interactive Patient Education 2016 Elsevier Inc.  Chest Wall Pain Chest wall pain is pain in or around the bones and muscles of your chest. Sometimes, an injury causes this pain. Sometimes, the cause may not be known. This pain may take several weeks or longer to get better. HOME CARE INSTRUCTIONS  Pay attention to any changes in your symptoms. Take these actions to help with your pain:   Rest as told by your health care provider.   Avoid activities that cause pain. These include any activities that use your chest muscles or your abdominal and side muscles to lift heavy items.   If directed, apply ice to the painful area:  Put ice in a plastic bag.  Place a towel between your skin and the bag.  Leave the ice on for 20 minutes, 2-3 times per day.  Take over-the-counter and prescription medicines only as told by your health care provider.  Do not use tobacco products, including cigarettes, chewing tobacco, and e-cigarettes. If you need help quitting, ask your health care provider.  Keep all follow-up visits as told by your health care provider. This is important. SEEK MEDICAL CARE IF:  You have a fever.  Your chest pain becomes worse.  You have new symptoms. SEEK IMMEDIATE MEDICAL CARE IF:  You have nausea or vomiting.  You feel sweaty or light-headed.  You have a cough with phlegm (sputum) or you cough up blood.  You develop shortness of breath.   This information is not intended to replace advice given to you by your health care provider. Make sure you discuss any questions you have with your health care provider.   Document Released: 11/10/2005  Document Revised: 08/01/2015 Document Reviewed: 02/05/2015 Elsevier Interactive Patient Education 2016 [employer].    IF you received an x-ray today, you will receive an invoice from [facility]. Please contact [facility] at [phone] with questions or concerns regarding your invoice.   IF you received labwork today, you will receive an invoice from [employer]. Please contact [facility] at [phone] with questions or concerns regarding your invoice.   Our billing staff will not be able to assist you with questions regarding bills from these companies.  You will be contacted with the lab results as soon as they are available. The fastest way to get  your results is to activate your My Chart account. Instructions are located on the last page of this paperwork. If you have not heard from [place] regarding the results in 2 weeks, please contact this office.

## 2016-09-01 NOTE — Telephone Encounter (Signed)
Don't see that pt has CB. LMOM for pt w/Dr Greene's message.

## 2016-10-30 DIAGNOSIS — I2510 Atherosclerotic heart disease of native coronary artery without angina pectoris: Secondary | ICD-10-CM | POA: Diagnosis not present

## 2016-10-30 DIAGNOSIS — I10 Essential (primary) hypertension: Secondary | ICD-10-CM | POA: Diagnosis not present

## 2016-10-30 DIAGNOSIS — E7800 Pure hypercholesterolemia, unspecified: Secondary | ICD-10-CM | POA: Diagnosis not present

## 2017-02-12 ENCOUNTER — Ambulatory Visit: Payer: Self-pay | Admitting: Family Medicine

## 2017-02-17 ENCOUNTER — Ambulatory Visit (INDEPENDENT_AMBULATORY_CARE_PROVIDER_SITE_OTHER): Payer: Medicare Other | Admitting: Family Medicine

## 2017-02-17 VITALS — BP 128/72 | HR 73 | Temp 97.9°F | Resp 16 | Ht 66.0 in | Wt 158.8 lb

## 2017-02-17 DIAGNOSIS — R05 Cough: Secondary | ICD-10-CM | POA: Diagnosis not present

## 2017-02-17 DIAGNOSIS — J329 Chronic sinusitis, unspecified: Secondary | ICD-10-CM

## 2017-02-17 DIAGNOSIS — R079 Chest pain, unspecified: Secondary | ICD-10-CM | POA: Diagnosis not present

## 2017-02-17 DIAGNOSIS — Z23 Encounter for immunization: Secondary | ICD-10-CM

## 2017-02-17 DIAGNOSIS — R059 Cough, unspecified: Secondary | ICD-10-CM

## 2017-02-17 MED ORDER — AMOXICILLIN-POT CLAVULANATE 875-125 MG PO TABS: 1.0000 | ORAL_TABLET | Freq: Two times a day (BID) | ORAL | 0 refills | Status: DC

## 2017-02-17 MED ORDER — ZOSTER VAC RECOMB ADJUVANTED 50 MCG/0.5ML IM SUSR: 0.5000 mL | Freq: Once | INTRAMUSCULAR | 1 refills | Status: AC

## 2017-02-17 NOTE — Patient Instructions (Addendum)
If pain in chest or cough returns - recheck for possible xray.   For sinuses and abnormal smell - try Augmentin. If abnormal smell does not improve in next 2 weeks - recheck.   Return to the clinic or go to the nearest emergency room if any of your symptoms worsen or new symptoms occur.  Shingles vaccine printed. The second dose should be between 2 and 6 months after first dose.  Follow-up in the next 3 months to discuss  other medications.  Return to the clinic or go to the nearest emergency room if any of your symptoms worsen or new symptoms occur.   Sinusitis, Adult Sinusitis is soreness and inflammation of your sinuses. Sinuses are hollow spaces in the bones around your face. Your sinuses are located:  Around your eyes.  In the middle of your forehead.  Behind your nose.  In your cheekbones. Your sinuses and nasal passages are lined with a stringy fluid (mucus). Mucus normally drains out of your sinuses. When your nasal tissues become inflamed or swollen, the mucus can become trapped or blocked so air cannot flow through your sinuses. This allows bacteria, viruses, and funguses to grow, which leads to infection. Sinusitis can develop quickly and last for 7?10 days (acute) or for more than 12 weeks (chronic). Sinusitis often develops after a cold. What are the causes? This condition is caused by anything that creates swelling in the sinuses or stops mucus from draining, including:  Allergies.  Asthma.  Bacterial or viral infection.  Abnormally shaped bones between the nasal passages.  Nasal growths that contain mucus (nasal polyps).  Narrow sinus openings.  Pollutants, such as chemicals or irritants in the air.  A foreign object stuck in the nose.  A fungal infection. This is rare. What increases the risk? The following factors may make you more likely to develop this condition:  Having allergies or asthma.  Having had a recent cold or respiratory tract  infection.  Having structural deformities or blockages in your nose or sinuses.  Having a weak immune system.  Doing a lot of swimming or diving.  Overusing nasal sprays.  Smoking. What are the signs or symptoms? The main symptoms of this condition are pain and a feeling of pressure around the affected sinuses. Other symptoms include:  Upper toothache.  Earache.  Headache.  Bad breath.  Decreased sense of smell and taste.  A cough that may get worse at night.  Fatigue.  Fever.  Thick drainage from your nose. The drainage is often green and it may contain pus (purulent).  Stuffy nose or congestion.  Postnasal drip. This is when extra mucus collects in the throat or back of the nose.  Swelling and warmth over the affected sinuses.  Sore throat.  Sensitivity to light. How is this diagnosed? This condition is diagnosed based on symptoms, a medical history, and a physical exam. To find out if your condition is acute or chronic, your health care provider may:  Look in your nose for signs of nasal polyps.  Tap over the affected sinus to check for signs of infection.  View the inside of your sinuses using an imaging device that has a light attached (endoscope). If your health care provider suspects that you have chronic sinusitis, you may also:  Be tested for allergies.  Have a sample of mucus taken from your nose (nasal culture) and checked for bacteria.  Have a mucus sample examined to see if your sinusitis is related to an  allergy. If your sinusitis does not respond to treatment and it lasts longer than 8 weeks, you may have an MRI or CT scan to check your sinuses. These scans also help to determine how severe your infection is. In rare cases, a bone biopsy may be done to rule out more serious types of fungal sinus disease. How is this treated? Treatment for sinusitis depends on the cause and whether your condition is chronic or acute. If a virus is causing your  sinusitis, your symptoms will go away on their own within 10 days. You may be given medicines to relieve your symptoms, including:  Topical nasal decongestants. They shrink swollen nasal passages and let mucus drain from your sinuses.  Antihistamines. These drugs block inflammation that is triggered by allergies. This can help to ease swelling in your nose and sinuses.  Topical nasal corticosteroids. These are nasal sprays that ease inflammation and swelling in your nose and sinuses.  Nasal saline washes. These rinses can help to get rid of thick mucus in your nose. If your condition is caused by bacteria, you will be given an antibiotic medicine. If your condition is caused by a fungus, you will be given an antifungal medicine. Surgery may be needed to correct underlying conditions, such as narrow nasal passages. Surgery may also be needed to remove polyps. Follow these instructions at home: Medicines   Take, use, or apply over-the-counter and prescription medicines only as told by your health care provider. These may include nasal sprays.  If you were prescribed an antibiotic medicine, take it as told by your health care provider. Do not stop taking the antibiotic even if you start to feel better. Hydrate and Humidify   Drink enough water to keep your urine clear or pale yellow. Staying hydrated will help to thin your mucus.  Use a cool mist humidifier to keep the humidity level in your home above 50%.  Inhale steam for 10-15 minutes, 3-4 times a day or as told by your health care provider. You can do this in the bathroom while a hot shower is running.  Limit your exposure to cool or dry air. Rest   Rest as much as possible.  Sleep with your head raised (elevated).  Make sure to get enough sleep each night. General instructions   Apply a warm, moist washcloth to your face 3-4 times a day or as told by your health care provider. This will help with discomfort.  Wash your hands  often with soap and water to reduce your exposure to viruses and other germs. If soap and water are not available, use hand sanitizer.  Do not smoke. Avoid being around people who are smoking (secondhand smoke).  Keep all follow-up visits as told by your health care provider. This is important. Contact a health care provider if:  You have a fever.  Your symptoms get worse.  Your symptoms do not improve within 10 days. Get help right away if:  You have a severe headache.  You have persistent vomiting.  You have pain or swelling around your face or eyes.  You have vision problems.  You develop confusion.  Your neck is stiff.  You have trouble breathing. This information is not intended to replace advice given to you by your health care provider. Make sure you discuss any questions you have with your health care provider. Document Released: 11/10/2005 Document Revised: 07/06/2016 Document Reviewed: 09/05/2015 Elsevier Interactive Patient Education  2017 [place]  you received an x-ray today, you will receive an invoice from [facility]. Please contact [facility] at [phone] with questions or concerns regarding your invoice.   IF you received labwork today, you will receive an invoice from [facility]. Please contact [facility] at [phone] with questions or concerns regarding your invoice.   Our billing staff will not be able to assist you with questions regarding bills from these companies.  You will be contacted with the lab results as soon as they are available. The fastest way to get your results is to activate your My Chart account. Instructions are located on the last page of this paperwork. If you have not heard from [place] regarding the results in 2 weeks, please contact this office.

## 2017-02-17 NOTE — Progress Notes (Signed)
Subjective:  By signing my name below, I, Essence Howell, attest that this documentation has been prepared under the direction and in the presence of [name], MD Electronically Signed: Ladene Artist, ED Scribe 02/17/2017 at 1:59 PM.   Patient ID: [name], female    DOB: May 07, 1946, 71 y.o.   MRN: [id number]  Chief Complaint  Patient presents with  . Illness    was sick 2wks ago, residual cough,    HPI EMMALYNN PINKHAM is a 71 y.o. female who presents to Primary Care at [facility] with a h/o CAD, hyperlipidemia, hypothyroidism, HTN. Last office visit was 08/25/16. Cardiologist: Dr. Einar Gip with last visit 10/30/16.   Pt states that she has been experiencing upper respiratory symptoms for several weeks. She states that symptoms started as nasal congestion and cough with discolored sputum for 2 weeks, however she states that cough has improved. She is also experiencing associated yellow rhinorrhea for a few weeks, a foul smell that no one else is able to smell, occasional HAs, discomfort behind both ears, postnasal drip. Pt also reports right-sided chest pain that occurred 2 weeks ago that has since resolved. She states that chest pain occurred ~2 weeks after onset of cough, was not reproducible, non-radiating or exacerbated with activity. Pt has tried Tylenol and Coricidin HBP Cough and Cold with mild relief. She denies nausea, vomiting, diaphoresis, recent fevers, sob, sneezing or chest pain at this time.   Immunizations Pt had the shingles vaccine in 10/2008 at age 40. She states that her friend was recently diagnosed with shingles. She requests info on Shingrix. No h/o immune system complications.   Patient Active Problem List   Diagnosis Date Noted  . Pain in joint, ankle and foot 02/14/2013  . Foot fracture 02/14/2013  . CAD (coronary artery disease) 12/09/2011  . Dyslipidemia 12/09/2011  . Osteopenia 12/09/2011  . Hypothyroid 12/09/2011  . Asthma 12/09/2011   Past Medical History:    Diagnosis Date  . Hyperlipidemia   . Hypertension   . Thyroid disease    Past Surgical History:  Procedure Laterality Date  . EYE SURGERY    . ORTHOPEDIC SURGERY  2012  . TONSILLECTOMY  1951   Allergies  Allergen Reactions  . Plavix [Clopidogrel Bisulfate]    Prior to Admission medications   Medication Sig Start Date End Date Taking? Authorizing Provider  aspirin 81 MG tablet Take 81 mg by mouth daily.     Hayden Rasmussen, MD  atorvastatin (LIPITOR) 40 MG tablet Take 1 tablet by mouth  daily 08/25/16   [name], MD  benazepril (LOTENSIN) 10 MG tablet Take 1 tablet by mouth  daily 08/25/16   [name], MD  levothyroxine (SYNTHROID, LEVOTHROID) 25 MCG tablet Take 1 tablet by mouth  daily before breakfast 08/25/16   [name], MD  OVER THE COUNTER MEDICATION Vitamin D 2000iu daily.    Historical Provider, MD   Social History   Social History  . Marital status: Married    Spouse name: N/A  . Number of children: N/A  . Years of education: N/A   Occupational History  . unemployed    Social History Main Topics  . Smoking status: Never Smoker  . Smokeless tobacco: Never Used  . Alcohol use 1.2 oz/week    2 Standard drinks or equivalent per week     Comment: 1  . Drug use: No  . Sexual activity: Not on file   Other Topics Concern  .  Not on file   Social History Narrative   Married. Education: college. Pt does exercise-   Review of Systems  Constitutional: Negative for diaphoresis and fever.  HENT: Positive for postnasal drip and rhinorrhea. Negative for sneezing.   Respiratory: Positive for cough. Negative for shortness of breath.   Cardiovascular: Negative for chest pain.  Gastrointestinal: Negative for nausea and vomiting.  Neurological: Positive for headaches (occasional).      Objective:   Physical Exam  Constitutional: She is oriented to person, place, and time. She appears well-developed and well-nourished. No distress.  HENT:  Head:  Normocephalic and atraumatic.  Right Ear: Hearing, tympanic membrane, external ear and ear canal normal.  Left Ear: Hearing, tympanic membrane, external ear and ear canal normal.  Nose: Mucosal edema (minimal, R greater than L turbinate) present.  Mouth/Throat: Oropharynx is clear and moist. No oropharyngeal exudate.  Scalp is nontender. Mastoid is nontender.   Eyes: Conjunctivae and EOM are normal. Pupils are equal, round, and reactive to light.  Cardiovascular: Normal rate, regular rhythm, normal heart sounds and intact distal pulses.   No murmur heard. Pulmonary/Chest: Effort normal and breath sounds normal. No respiratory distress. She has no wheezes. She has no rhonchi.  Neurological: She is alert and oriented to person, place, and time.  Skin: Skin is warm and dry. No rash noted.  Psychiatric: She has a normal mood and affect. Her behavior is normal.  Vitals reviewed.  Vitals:   02/17/17 1352  BP: 128/72  Pulse: 73  Resp: 16  Temp: 97.9 F (36.6 C)  TempSrc: Oral  SpO2: 99%  Weight: 158 lb 12.8 oz (72 kg)  Height: 5\' 6"  (1.676 m)      Assessment & Plan:    MERL GUARDINO is a 71 y.o. female Cough Right-sided chest pain  - Both now resolved. Lungs clear, vital signs reassuring. Possible chest wall pain with cough, and prior bronchitis/viral illness likely. As she is improved, we'll hold on further imaging at this time. RTC precautions if symptoms recur.  Sinusitis, unspecified chronicity, unspecified location  - Possible sinusitis with change in smell as well as persistent discolored nasal discharge. Start Augmentin, possible side effects discussed, RTC precautions including if persistent change in smell.  Need for shingles vaccine - Plan: Zoster Vac Recomb Adjuvanted [facility]) injection  - Prior Zostavax in 2009, requested Shingrix. Prescription given.  Meds ordered this encounter  Medications  . Zoster Vac Recomb Adjuvanted [facility]) injection    Sig: Inject 0.5 mLs  into the muscle once.    Dispense:  0.5 mL    Refill:  1  . amoxicillin-clavulanate (AUGMENTIN) 875-125 MG tablet    Sig: Take 1 tablet by mouth 2 (two) times daily.    Dispense:  20 tablet    Refill:  0   Patient Instructions   If pain in chest or cough returns - recheck for possible xray.   For sinuses and abnormal smell - try Augmentin. If abnormal smell does not improve in next 2 weeks - recheck.   Return to the clinic or go to the nearest emergency room if any of your symptoms worsen or new symptoms occur.  Shingles vaccine printed. The second dose should be between 2 and 6 months after first dose.  Follow-up in the next 3 months to discuss  other medications.  Return to the clinic or go to the nearest emergency room if any of your symptoms worsen or new symptoms occur.   Sinusitis, Adult Sinusitis is  soreness and inflammation of your sinuses. Sinuses are hollow spaces in the bones around your face. Your sinuses are located:  Around your eyes.  In the middle of your forehead.  Behind your nose.  In your cheekbones. Your sinuses and nasal passages are lined with a stringy fluid (mucus). Mucus normally drains out of your sinuses. When your nasal tissues become inflamed or swollen, the mucus can become trapped or blocked so air cannot flow through your sinuses. This allows bacteria, viruses, and funguses to grow, which leads to infection. Sinusitis can develop quickly and last for 7?10 days (acute) or for more than 12 weeks (chronic). Sinusitis often develops after a cold. What are the causes? This condition is caused by anything that creates swelling in the sinuses or stops mucus from draining, including:  Allergies.  Asthma.  Bacterial or viral infection.  Abnormally shaped bones between the nasal passages.  Nasal growths that contain mucus (nasal polyps).  Narrow sinus openings.  Pollutants, such as chemicals or irritants in the air.  A foreign object stuck in  the nose.  A fungal infection. This is rare. What increases the risk? The following factors may make you more likely to develop this condition:  Having allergies or asthma.  Having had a recent cold or respiratory tract infection.  Having structural deformities or blockages in your nose or sinuses.  Having a weak immune system.  Doing a lot of swimming or diving.  Overusing nasal sprays.  Smoking. What are the signs or symptoms? The main symptoms of this condition are pain and a feeling of pressure around the affected sinuses. Other symptoms include:  Upper toothache.  Earache.  Headache.  Bad breath.  Decreased sense of smell and taste.  A cough that may get worse at night.  Fatigue.  Fever.  Thick drainage from your nose. The drainage is often green and it may contain pus (purulent).  Stuffy nose or congestion.  Postnasal drip. This is when extra mucus collects in the throat or back of the nose.  Swelling and warmth over the affected sinuses.  Sore throat.  Sensitivity to light. How is this diagnosed? This condition is diagnosed based on symptoms, a medical history, and a physical exam. To find out if your condition is acute or chronic, your health care provider may:  Look in your nose for signs of nasal polyps.  Tap over the affected sinus to check for signs of infection.  View the inside of your sinuses using an imaging device that has a light attached (endoscope). If your health care provider suspects that you have chronic sinusitis, you may also:  Be tested for allergies.  Have a sample of mucus taken from your nose (nasal culture) and checked for bacteria.  Have a mucus sample examined to see if your sinusitis is related to an allergy. If your sinusitis does not respond to treatment and it lasts longer than 8 weeks, you may have an MRI or CT scan to check your sinuses. These scans also help to determine how severe your infection is. In rare  cases, a bone biopsy may be done to rule out more serious types of fungal sinus disease. How is this treated? Treatment for sinusitis depends on the cause and whether your condition is chronic or acute. If a virus is causing your sinusitis, your symptoms will go away on their own within 10 days. You may be given medicines to relieve your symptoms, including:  Topical nasal decongestants. They shrink swollen nasal  passages and let mucus drain from your sinuses.  Antihistamines. These drugs block inflammation that is triggered by allergies. This can help to ease swelling in your nose and sinuses.  Topical nasal corticosteroids. These are nasal sprays that ease inflammation and swelling in your nose and sinuses.  Nasal saline washes. These rinses can help to get rid of thick mucus in your nose. If your condition is caused by bacteria, you will be given an antibiotic medicine. If your condition is caused by a fungus, you will be given an antifungal medicine. Surgery may be needed to correct underlying conditions, such as narrow nasal passages. Surgery may also be needed to remove polyps. Follow these instructions at home: Medicines   Take, use, or apply over-the-counter and prescription medicines only as told by your health care provider. These may include nasal sprays.  If you were prescribed an antibiotic medicine, take it as told by your health care provider. Do not stop taking the antibiotic even if you start to feel better. Hydrate and Humidify   Drink enough water to keep your urine clear or pale yellow. Staying hydrated will help to thin your mucus.  Use a cool mist humidifier to keep the humidity level in your home above 50%.  Inhale steam for 10-15 minutes, 3-4 times a day or as told by your health care provider. You can do this in the bathroom while a hot shower is running.  Limit your exposure to cool or dry air. Rest   Rest as much as possible.  Sleep with your head raised  (elevated).  Make sure to get enough sleep each night. General instructions   Apply a warm, moist washcloth to your face 3-4 times a day or as told by your health care provider. This will help with discomfort.  Wash your hands often with soap and water to reduce your exposure to viruses and other germs. If soap and water are not available, use hand sanitizer.  Do not smoke. Avoid being around people who are smoking (secondhand smoke).  Keep all follow-up visits as told by your health care provider. This is important. Contact a health care provider if:  You have a fever.  Your symptoms get worse.  Your symptoms do not improve within 10 days. Get help right away if:  You have a severe headache.  You have persistent vomiting.  You have pain or swelling around your face or eyes.  You have vision problems.  You develop confusion.  Your neck is stiff.  You have trouble breathing. This information is not intended to replace advice given to you by your health care provider. Make sure you discuss any questions you have with your health care provider. Document Released: 11/10/2005 Document Revised: 07/06/2016 Document Reviewed: 09/05/2015 Elsevier Interactive Patient Education  2017 [employer].     IF you received an x-ray today, you will receive an invoice from [facility]. Please contact [facility] at [phone] with questions or concerns regarding your invoice.   IF you received labwork today, you will receive an invoice from [place]. Please contact [facility] at [phone] with questions or concerns regarding your invoice.   Our billing staff will not be able to assist you with questions regarding bills from these companies.  You will be contacted with the lab results as soon as they are available. The fastest way to get your results is to activate your My Chart account. Instructions are located on the last page of this paperwork. If you have not  heard from [place] regarding the results in 2 weeks, please contact this office.       I personally performed the services described in this documentation, which was scribed in my presence. The recorded information has been reviewed and considered for accuracy and completeness, addended by me as needed, and agree with information above.  Signed,   [name], MD [facility].  02/17/17 2:37 PM

## 2017-03-04 DIAGNOSIS — H2513 Age-related nuclear cataract, bilateral: Secondary | ICD-10-CM | POA: Diagnosis not present

## 2017-03-04 DIAGNOSIS — Z9889 Other specified postprocedural states: Secondary | ICD-10-CM | POA: Diagnosis not present

## 2017-03-11 ENCOUNTER — Telehealth: Payer: Self-pay | Admitting: Emergency Medicine

## 2017-03-11 ENCOUNTER — Ambulatory Visit (INDEPENDENT_AMBULATORY_CARE_PROVIDER_SITE_OTHER): Payer: Medicare Other | Admitting: Family Medicine

## 2017-03-11 VITALS — BP 104/69 | HR 101 | Temp 98.1°F | Resp 16 | Ht 66.0 in | Wt 157.8 lb

## 2017-03-11 DIAGNOSIS — E785 Hyperlipidemia, unspecified: Secondary | ICD-10-CM | POA: Diagnosis not present

## 2017-03-11 DIAGNOSIS — R300 Dysuria: Secondary | ICD-10-CM

## 2017-03-11 DIAGNOSIS — R600 Localized edema: Secondary | ICD-10-CM | POA: Diagnosis not present

## 2017-03-11 LAB — POCT URINALYSIS DIPSTICK
Bilirubin, UA: NEGATIVE
GLUCOSE UA: NEGATIVE
Ketones, UA: NEGATIVE
NITRITE UA: POSITIVE
Protein, UA: 100
Spec Grav, UA: 1.015 (ref 1.010–1.025)
UROBILINOGEN UA: 0.2 U/dL
pH, UA: 5.5 (ref 5.0–8.0)

## 2017-03-11 LAB — POC MICROSCOPIC URINALYSIS (UMFC): MUCUS RE: ABSENT

## 2017-03-11 MED ORDER — CEPHALEXIN 500 MG PO CAPS: 500.0000 mg | ORAL_CAPSULE | Freq: Three times a day (TID) | ORAL | 0 refills | Status: DC

## 2017-03-11 NOTE — Progress Notes (Signed)
Doris AMBROCIO is a 71 y.o. female who presents to [facility] today with complaints concerning for UTI:  1.  Concern for UTI: Doris Wilkerson is a 71 y.o. female who complains of urinary frequency, urgency and dysuria x 2 days.  She denies any flank or back pain, fever, chills, vomiting, or abnormal vaginal discharge/itching/bleeding.  Does endorse some suprapubic pressure.  No history of incontinence.  Recently diagnosed with URI and treated with Augmentin.    2.  LLE edema:  Patient also complaining of left lower extremity edema since Friday, which is about 5 days. She states that she noted after work both of her legs were swollen. However in her left lower extremity the swelling has persisted. She has been warm to the touch and erythematous. She is not actually had any pain.  No dyspnea. No difficult breathing. No chest pain. She's not been on any recent long car rides or plane trips.  The following portions of the patient's history were reviewed and updated as appropriate: allergies, current medications, past medical history, family and social history, and problem list.  Patient is a nonsmoker.    Past Medical History:  Diagnosis Date  . Hyperlipidemia   . Hypertension   . Thyroid disease    Past Surgical History:  Procedure Laterality Date  . EYE SURGERY    . ORTHOPEDIC SURGERY  2012  . TONSILLECTOMY  1951    Medications reviewed. Current Outpatient Prescriptions  Medication Sig Dispense Refill  . aspirin 81 MG tablet Take 81 mg by mouth daily.     Marland Kitchen atorvastatin (LIPITOR) 40 MG tablet Take 1 tablet by mouth  daily 90 tablet 2  . benazepril (LOTENSIN) 10 MG tablet Take 1 tablet by mouth  daily 90 tablet 2  . levothyroxine (SYNTHROID, LEVOTHROID) 25 MCG tablet Take 1 tablet by mouth  daily before breakfast 90 tablet 2  . OVER THE COUNTER MEDICATION Vitamin D 2000iu daily.    Marland Kitchen amoxicillin-clavulanate (AUGMENTIN) 875-125 MG tablet Take 1 tablet by mouth 2 (two) times daily. (Patient not  taking: Reported on 03/11/2017) 20 tablet 0  . cephALEXin (KEFLEX) 500 MG capsule Take 1 capsule (500 mg total) by mouth 3 (three) times daily. 21 capsule 0   No current facility-administered medications for this visit.     ROS as above otherwise neg.       Objective:   Physical Exam BP 104/69   Pulse (!) 101   Temp 98.1 F (36.7 C) (Oral)   Resp 16   Ht 5\' 6"  (1.676 m)   Wt 157 lb 12.8 oz (71.6 kg)   SpO2 99%   BMI 25.47 kg/m  Gen:  Alert, cooperative patient who appears stated age in no acute distress.  Vital signs reviewed. Mouth: MMM Cardiac:  Regular rate and rhythm without murmur auscultated.  Good S1/S2. Pulm:  Clear to auscultation bilaterally with good air movement.  No wheezes or rales noted.   Abdomen:  Soft/ND/NT.  No CVA tenderness bilaterally  Ext:  RLE WNL.  LLE with +2 edema at ankle and some overlying erythema medial aspect of leg.  Nontender, no palpable cord.    Results for orders placed or performed in visit on 03/11/17 (from the past 72 hour(s))  POCT urinalysis dipstick     Status: Abnormal   Collection Time: 03/11/17  2:07 PM  Result Value Ref Range   Color, UA yellow    Clarity, UA cloudy    Glucose, UA negative  Bilirubin, UA negative    Ketones, UA negative    Spec Grav, UA 1.015 1.010 - 1.025   Blood, UA large    pH, UA 5.5 5.0 - 8.0   Protein, UA 100    Urobilinogen, UA 0.2 0.2 or 1.0 E.U./dL   Nitrite, UA positive    Leukocytes, UA Moderate (2+) (A) Negative  POCT Microscopic Urinalysis ([facility])     Status: Abnormal   Collection Time: 03/11/17  2:11 PM  Result Value Ref Range   WBC,UR,HPF,POC Too numerous to count  (A) None WBC/hpf   RBC,UR,HPF,POC Few (A) None RBC/hpf   Bacteria Many (A) None, Too numerous to count   Mucus Absent Absent   Epithelial Cells, UR Per Microscopy Few (A) None, Too numerous to count cells/hpf     Imp/Plan: 1.  UTI: Treatment with Keflex x 7 days.  Urine culture has been sent. Escherichia coli is  notoriously resistant to amoxicillin in this community so makes sense that this would not have treated a UTI. Treat based on symptoms and U/A. Call or return to clinic prn if these symptoms worsen or fail to improve as anticipated, or if she begins to have any back pain, fevers, or chills.  2.  LLE edema: - Concern for possible DVT. -Sending for left lower extremity ultrasound. -Also checking labs for any systemic cause of LE edema.  3.  HLD: - patient reports she is lipid panel every 6 months. We are going to add this to her labs since we're her drawing blood today.

## 2017-03-11 NOTE — Telephone Encounter (Signed)
Left message scheduled [place] is at [facility] tomorrow 03/12/2017 @ 10am. Advised to arrive at 945 am for registration.

## 2017-03-11 NOTE — Patient Instructions (Addendum)
  It was good to meet you today.  Take the Keflex one pill 3 times a day for the next 7 days.  We are sending you for a ultrasound of your leg. I will let you know these results.  We are also checking labs to see if there is anything that is causing your leg swelling. This will also check for your regular 6 month labs.   IF you received an x-ray today, you will receive an invoice from [facility]. Please contact [facility] at [phone] with questions or concerns regarding your invoice.   IF you received labwork today, you will receive an invoice from [place]. Please contact [facility] at [phone] with questions or concerns regarding your invoice.   Our billing staff will not be able to assist you with questions regarding bills from these companies.  You will be contacted with the lab results as soon as they are available. The fastest way to get your results is to activate your My Chart account. Instructions are located on the last page of this paperwork. If you have not heard from [place] regarding the results in 2 weeks, please contact this office.

## 2017-03-11 NOTE — Progress Notes (Signed)
sj

## 2017-03-12 ENCOUNTER — Telehealth: Payer: Self-pay | Admitting: Family Medicine

## 2017-03-12 ENCOUNTER — Ambulatory Visit (HOSPITAL_COMMUNITY)
Admission: RE | Admit: 2017-03-12 | Discharge: 2017-03-12 | Disposition: A | Discharge status: Home / Self Care | Payer: Medicare Other | Source: Ambulatory Visit | Attending: Family Medicine | Admitting: Family Medicine

## 2017-03-12 DIAGNOSIS — R600 Localized edema: Secondary | ICD-10-CM | POA: Diagnosis not present

## 2017-03-12 LAB — CBC
HEMATOCRIT: 37.4 % (ref 34.0–46.6)
Hemoglobin: 12.5 g/dL (ref 11.1–15.9)
MCH: 28.6 pg (ref 26.6–33.0)
MCHC: 33.4 g/dL (ref 31.5–35.7)
MCV: 86 fL (ref 79–97)
PLATELETS: 287 10*3/uL (ref 150–379)
RBC: 4.37 x10E6/uL (ref 3.77–5.28)
RDW: 14.2 % (ref 12.3–15.4)
WBC: 9.6 10*3/uL (ref 3.4–10.8)

## 2017-03-12 LAB — COMPREHENSIVE METABOLIC PANEL
ALK PHOS: 76 IU/L (ref 39–117)
ALT: 15 IU/L (ref 0–32)
AST: 22 IU/L (ref 0–40)
Albumin/Globulin Ratio: 2.2 (ref 1.2–2.2)
Albumin: 4.6 g/dL (ref 3.5–4.8)
BUN/Creatinine Ratio: 23 (ref 12–28)
BUN: 16 mg/dL (ref 8–27)
Bilirubin Total: 0.6 mg/dL (ref 0.0–1.2)
CO2: 26 mmol/L (ref 18–29)
CREATININE: 0.69 mg/dL (ref 0.57–1.00)
Calcium: 9.9 mg/dL (ref 8.7–10.3)
Chloride: 98 mmol/L (ref 96–106)
GFR calc Af Amer: 102 mL/min/{1.73_m2} (ref >59)
GFR calc non Af Amer: 88 mL/min/{1.73_m2} (ref >59)
GLOBULIN, TOTAL: 2.1 g/dL (ref 1.5–4.5)
GLUCOSE: 79 mg/dL (ref 65–99)
Potassium: 3.8 mmol/L (ref 3.5–5.2)
SODIUM: 139 mmol/L (ref 134–144)
Total Protein: 6.7 g/dL (ref 6.0–8.5)

## 2017-03-12 LAB — LIPID PANEL
CHOLESTEROL TOTAL: 168 mg/dL (ref 100–199)
Chol/HDL Ratio: 2.3 ratio (ref 0.0–4.4)
HDL: 73 mg/dL (ref >39)
LDL CALC: 76 mg/dL (ref 0–99)
TRIGLYCERIDES: 94 mg/dL (ref 0–149)
VLDL Cholesterol Cal: 19 mg/dL (ref 5–40)

## 2017-03-12 LAB — URINE CULTURE: ORGANISM ID, BACTERIA: NO GROWTH

## 2017-03-12 LAB — TSH: TSH: 1.79 u[IU]/mL (ref 0.450–4.500)

## 2017-03-12 NOTE — Telephone Encounter (Signed)
Pt's husband states that wifes UTI has been progressing and seems to be moving into her kidneys. Suggested that we schd her an apt for tomorrow but he would rather place a message to see if the given antibiotics will treat this.

## 2017-03-12 NOTE — Progress Notes (Signed)
VASCULAR LAB PRELIMINARY  PRELIMINARY  PRELIMINARY  PRELIMINARY  Left lower extremity venous duplex completed.    Preliminary report:  Left:  No evidence of DVT, superficial thrombosis, or Baker's cyst.  Najae Rathert, RVS 03/12/2017, 10:53 AM

## 2017-03-13 ENCOUNTER — Encounter: Payer: Self-pay | Admitting: Family Medicine

## 2017-03-13 NOTE — Telephone Encounter (Signed)
U/A showed pretty clear cut UTI.  However urine culture was surprisingly negative.  If she is having vomiting, she likely is not retaining her oral medications, and she should be seen.  May be another issue other than UTI if she is worsening.  Again, recommend she be seen.    Thanks for your help, JW

## 2017-03-13 NOTE — Telephone Encounter (Signed)
Pt on keflex Fever and nausea since ov  Today is feeling better, fever 99.9 Less nausea. Is keflex still the course? I advised if fever spike needs re eval

## 2017-03-13 NOTE — Telephone Encounter (Signed)
Husband advised Feeling better

## 2017-03-16 ENCOUNTER — Ambulatory Visit (INDEPENDENT_AMBULATORY_CARE_PROVIDER_SITE_OTHER): Payer: Medicare Other | Admitting: Emergency Medicine

## 2017-03-16 VITALS — BP 130/72 | HR 73 | Temp 98.3°F | Resp 16 | Ht 66.0 in | Wt 155.0 lb

## 2017-03-16 DIAGNOSIS — R300 Dysuria: Secondary | ICD-10-CM | POA: Diagnosis not present

## 2017-03-16 DIAGNOSIS — R1030 Lower abdominal pain, unspecified: Secondary | ICD-10-CM | POA: Diagnosis not present

## 2017-03-16 DIAGNOSIS — N342 Other urethritis: Secondary | ICD-10-CM | POA: Diagnosis not present

## 2017-03-16 LAB — POCT URINALYSIS DIP (MANUAL ENTRY)
BILIRUBIN UA: NEGATIVE mg/dL
Bilirubin, UA: NEGATIVE
GLUCOSE UA: NEGATIVE mg/dL
Leukocytes, UA: NEGATIVE
Nitrite, UA: NEGATIVE
PH UA: 7 (ref 5.0–8.0)
Protein Ur, POC: NEGATIVE mg/dL
Urobilinogen, UA: 0.2 E.U./dL

## 2017-03-16 LAB — POC MICROSCOPIC URINALYSIS (UMFC): Mucus: ABSENT

## 2017-03-16 NOTE — Progress Notes (Signed)
[name] 71 y.o.   Chief Complaint  Patient presents with  . Follow-up    bladder issues from 03/11/17, fever last Thursday 03/13/17, 101 degrees  . weakness    "no appetite" pain in lower right abdomen over the weekend, "not hurting this am"    HISTORY OF PRESENT ILLNESS: This is a 71 y.o. female here for f/u UTI; had negative culture; symptoms better but still feels slightly weak with no apetite; over the weekend had mild lower abdominal discomfort but gone now; blood work normal. No new symptoms. Overall much improved. Taking Keflex tid.  HPI   Prior to Admission medications   Medication Sig Start Date End Date Taking? Authorizing Provider  aspirin 81 MG tablet Take 81 mg by mouth daily.    Yes Hayden Rasmussen, MD  atorvastatin (LIPITOR) 40 MG tablet Take 1 tablet by mouth  daily 08/25/16  Yes [name], MD  benazepril (LOTENSIN) 10 MG tablet Take 1 tablet by mouth  daily 08/25/16  Yes [name], MD  cephALEXin (KEFLEX) 500 MG capsule Take 1 capsule (500 mg total) by mouth 3 (three) times daily. 03/11/17  Yes Alveda Reasons, MD  levothyroxine (SYNTHROID, LEVOTHROID) 25 MCG tablet Take 1 tablet by mouth  daily before breakfast 08/25/16  Yes [name], MD  OVER THE COUNTER MEDICATION Vitamin D 2000iu daily.   Yes Historical Provider, MD    Allergies  Allergen Reactions  . Plavix [Clopidogrel Bisulfate]     Patient Active Problem List   Diagnosis Date Noted  . Pain in joint, ankle and foot 02/14/2013  . Foot fracture 02/14/2013  . CAD (coronary artery disease) 12/09/2011  . Dyslipidemia 12/09/2011  . Osteopenia 12/09/2011  . Hypothyroid 12/09/2011  . Asthma 12/09/2011    Past Medical History:  Diagnosis Date  . Hyperlipidemia   . Hypertension   . Thyroid disease     Past Surgical History:  Procedure Laterality Date  . EYE SURGERY    . ORTHOPEDIC SURGERY  2012  . TONSILLECTOMY  1951    Social History   Social History  . Marital status:  Married    Spouse name: N/A  . Number of children: N/A  . Years of education: N/A   Occupational History  . unemployed    Social History Main Topics  . Smoking status: Never Smoker  . Smokeless tobacco: Never Used  . Alcohol use 1.2 oz/week    2 Standard drinks or equivalent per week     Comment: 1  . Drug use: No  . Sexual activity: Not on file   Other Topics Concern  . Not on file   Social History Narrative   Married. Education: college. Pt does exercise-    Family History  Problem Relation Age of Onset  . Cancer Father   . Heart disease Brother   . Dementia Brother      Review of Systems  Constitutional: Negative for chills and fever.  HENT: Negative.   Eyes: Negative.   Respiratory: Negative for cough and shortness of breath.   Cardiovascular: Positive for leg swelling (resolved). Negative for chest pain and palpitations.  Gastrointestinal: Positive for abdominal pain (gone today). Negative for constipation, diarrhea, nausea and vomiting.  Genitourinary: Negative for dysuria, flank pain, hematuria and urgency.  Musculoskeletal: Negative.   Skin: Negative for rash.  Neurological: Positive for weakness. Negative for dizziness and headaches.  Endo/Heme/Allergies: Negative.   All other systems reviewed and are negative.  Vitals:  03/16/17 0935  BP: 130/72  Pulse: 73  Resp: 16  Temp: 98.3 F (36.8 C)     Physical Exam  Constitutional: She is oriented to person, place, and time. She appears well-developed and well-nourished.  HENT:  Head: Normocephalic and atraumatic.  Eyes: Conjunctivae and EOM are normal. Pupils are equal, round, and reactive to light.  Neck: Normal range of motion. Neck supple. No JVD present. Carotid bruit is not present. No thyromegaly present.  Cardiovascular: Normal rate, regular rhythm and normal heart sounds.   Pulmonary/Chest: Effort normal and breath sounds normal.  Abdominal: Soft. Bowel sounds are normal. She exhibits no  distension. There is no tenderness.  Musculoskeletal: Normal range of motion.  Lymphadenopathy:    She has no cervical adenopathy.  Neurological: She is alert and oriented to person, place, and time. No sensory deficit. She exhibits normal muscle tone.  Skin: Skin is warm and dry. Capillary refill takes less than 2 seconds. No rash noted.  Psychiatric: She has a normal mood and affect.  Vitals reviewed.  Results for orders placed or performed in visit on 03/16/17 (from the past 24 hour(s))  POCT urinalysis dipstick     Status: Abnormal   Collection Time: 03/16/17 10:10 AM  Result Value Ref Range   Color, UA yellow yellow   Clarity, UA clear clear   Glucose, UA negative negative mg/dL   Bilirubin, UA negative negative   Ketones, POC UA negative negative mg/dL   Spec Grav, UA <=1.005 (A) 1.010 - 1.025   Blood, UA trace-intact (A) negative   pH, UA 7.0 5.0 - 8.0   Protein Ur, POC negative negative mg/dL   Urobilinogen, UA 0.2 0.2 or 1.0 E.U./dL   Nitrite, UA Negative Negative   Leukocytes, UA Negative Negative  POCT Microscopic Urinalysis ([facility])     Status: Abnormal   Collection Time: 03/16/17 10:39 AM  Result Value Ref Range   WBC,UR,HPF,POC None None WBC/hpf   RBC,UR,HPF,POC None None RBC/hpf   Bacteria Few (A) None, Too numerous to count   Mucus Absent Absent   Epithelial Cells, UR Per Microscopy None None, Too numerous to count cells/hpf     ASSESSMENT & PLAN: Improved UTI. Maybe mild Keflex side effects but not enough to warrant change or discontinuation. Andreal was seen today for follow-up and weakness.  Diagnoses and all orders for this visit:  Dysuria Comments: improved Orders: -     POCT Microscopic Urinalysis ([facility]) -     POCT urinalysis dipstick -     Urine culture  Infective urethritis Comments: improved  Lower abdominal pain Comments: resolved    Patient Instructions    Continue and finish Cephalexin. Return as needed.  Urinary Tract  Infection, Adult A urinary tract infection (UTI) is an infection of any part of the urinary tract. The urinary tract includes the:  Kidneys.  Ureters.  Bladder.  Urethra. These organs make, store, and get rid of pee (urine) in the body. Follow these instructions at home:  Take over-the-counter and prescription medicines only as told by your doctor.  If you were prescribed an antibiotic medicine, take it as told by your doctor. Do not stop taking the antibiotic even if you start to feel better.  Avoid the following drinks:  Alcohol.  Caffeine.  Tea.  Carbonated drinks.  Drink enough fluid to keep your pee clear or pale yellow.  Keep all follow-up visits as told by your doctor. This is important.  Make sure to:  Empty your bladder  often and completely. Do not to hold pee for long periods of time.  Empty your bladder before and after sex.  Wipe from front to back after a bowel movement if you are female. Use each tissue one time when you wipe. Contact a doctor if:  You have back pain.  You have a fever.  You feel sick to your stomach (nauseous).  You throw up (vomit).  Your symptoms do not get better after 3 days.  Your symptoms go away and then come back. Get help right away if:  You have very bad back pain.  You have very bad lower belly (abdominal) pain.  You are throwing up and cannot keep down any medicines or water. This information is not intended to replace advice given to you by your health care provider. Make sure you discuss any questions you have with your health care provider. Document Released: 04/28/2008 Document Revised: 04/17/2016 Document Reviewed: 10/01/2015 Elsevier Interactive Patient Education  2017 [employer].    IF you received an x-ray today, you will receive an invoice from [facility]. Please contact [facility] at [phone] with questions or concerns regarding your invoice.   IF you received labwork  today, you will receive an invoice from [facility]. Please contact [facility] at [phone] with questions or concerns regarding your invoice.   Our billing staff will not be able to assist you with questions regarding bills from these companies.  You will be contacted with the lab results as soon as they are available. The fastest way to get your results is to activate your My Chart account. Instructions are located on the last page of this paperwork. If you have not heard from [place] regarding the results in 2 weeks, please contact this office.         Agustina Caroli, MD [facility]

## 2017-03-16 NOTE — Patient Instructions (Addendum)
  Continue and finish Cephalexin. Return as needed.  Urinary Tract Infection, Adult A urinary tract infection (UTI) is an infection of any part of the urinary tract. The urinary tract includes the:  Kidneys.  Ureters.  Bladder.  Urethra. These organs make, store, and get rid of pee (urine) in the body. Follow these instructions at home:  Take over-the-counter and prescription medicines only as told by your doctor.  If you were prescribed an antibiotic medicine, take it as told by your doctor. Do not stop taking the antibiotic even if you start to feel better.  Avoid the following drinks:  Alcohol.  Caffeine.  Tea.  Carbonated drinks.  Drink enough fluid to keep your pee clear or pale yellow.  Keep all follow-up visits as told by your doctor. This is important.  Make sure to:  Empty your bladder often and completely. Do not to hold pee for long periods of time.  Empty your bladder before and after sex.  Wipe from front to back after a bowel movement if you are female. Use each tissue one time when you wipe. Contact a doctor if:  You have back pain.  You have a fever.  You feel sick to your stomach (nauseous).  You throw up (vomit).  Your symptoms do not get better after 3 days.  Your symptoms go away and then come back. Get help right away if:  You have very bad back pain.  You have very bad lower belly (abdominal) pain.  You are throwing up and cannot keep down any medicines or water. This information is not intended to replace advice given to you by your health care provider. Make sure you discuss any questions you have with your health care provider. Document Released: 04/28/2008 Document Revised: 04/17/2016 Document Reviewed: 10/01/2015 Elsevier Interactive Patient Education  2017 [employer].    IF you received an x-ray today, you will receive an invoice from [facility]. Please contact [facility] at [phone] with  questions or concerns regarding your invoice.   IF you received labwork today, you will receive an invoice from [facility]. Please contact [facility] at [phone] with questions or concerns regarding your invoice.   Our billing staff will not be able to assist you with questions regarding bills from these companies.  You will be contacted with the lab results as soon as they are available. The fastest way to get your results is to activate your My Chart account. Instructions are located on the last page of this paperwork. If you have not heard from [place] regarding the results in 2 weeks, please contact this office.

## 2017-03-17 LAB — URINE CULTURE: Organism ID, Bacteria: NO GROWTH

## 2017-05-05 DIAGNOSIS — H2511 Age-related nuclear cataract, right eye: Secondary | ICD-10-CM | POA: Diagnosis not present

## 2017-05-05 DIAGNOSIS — H2513 Age-related nuclear cataract, bilateral: Secondary | ICD-10-CM | POA: Diagnosis not present

## 2017-05-05 DIAGNOSIS — H02839 Dermatochalasis of unspecified eye, unspecified eyelid: Secondary | ICD-10-CM | POA: Diagnosis not present

## 2017-05-05 DIAGNOSIS — H25043 Posterior subcapsular polar age-related cataract, bilateral: Secondary | ICD-10-CM | POA: Diagnosis not present

## 2017-05-05 DIAGNOSIS — H25013 Cortical age-related cataract, bilateral: Secondary | ICD-10-CM | POA: Diagnosis not present

## 2017-05-05 DIAGNOSIS — H40003 Preglaucoma, unspecified, bilateral: Secondary | ICD-10-CM | POA: Diagnosis not present

## 2017-06-01 ENCOUNTER — Other Ambulatory Visit: Payer: Self-pay | Admitting: Family Medicine

## 2017-06-01 DIAGNOSIS — E039 Hypothyroidism, unspecified: Secondary | ICD-10-CM

## 2017-06-01 DIAGNOSIS — E782 Mixed hyperlipidemia: Secondary | ICD-10-CM

## 2017-06-01 DIAGNOSIS — I10 Essential (primary) hypertension: Secondary | ICD-10-CM

## 2017-06-15 ENCOUNTER — Encounter: Payer: Self-pay | Admitting: Physician Assistant

## 2017-06-15 ENCOUNTER — Telehealth: Payer: Self-pay | Admitting: Family Medicine

## 2017-06-15 ENCOUNTER — Ambulatory Visit (INDEPENDENT_AMBULATORY_CARE_PROVIDER_SITE_OTHER): Payer: Medicare Other | Admitting: Physician Assistant

## 2017-06-15 VITALS — BP 107/69 | HR 79 | Temp 98.7°F | Resp 18 | Ht 66.0 in | Wt 156.2 lb

## 2017-06-15 DIAGNOSIS — E782 Mixed hyperlipidemia: Secondary | ICD-10-CM

## 2017-06-15 DIAGNOSIS — R102 Pelvic and perineal pain: Secondary | ICD-10-CM | POA: Diagnosis not present

## 2017-06-15 DIAGNOSIS — E039 Hypothyroidism, unspecified: Secondary | ICD-10-CM

## 2017-06-15 DIAGNOSIS — R350 Frequency of micturition: Secondary | ICD-10-CM | POA: Diagnosis not present

## 2017-06-15 DIAGNOSIS — R300 Dysuria: Secondary | ICD-10-CM | POA: Diagnosis not present

## 2017-06-15 DIAGNOSIS — I10 Essential (primary) hypertension: Secondary | ICD-10-CM

## 2017-06-15 DIAGNOSIS — Z0000 Encounter for general adult medical examination without abnormal findings: Secondary | ICD-10-CM

## 2017-06-15 DIAGNOSIS — N3090 Cystitis, unspecified without hematuria: Secondary | ICD-10-CM

## 2017-06-15 LAB — POCT URINALYSIS DIP (MANUAL ENTRY)
BILIRUBIN UA: NEGATIVE
GLUCOSE UA: NEGATIVE mg/dL
Ketones, POC UA: NEGATIVE mg/dL
NITRITE UA: NEGATIVE
Protein Ur, POC: 30 mg/dL — AB
Spec Grav, UA: 1.015 (ref 1.010–1.025)
Urobilinogen, UA: 0.2 E.U./dL
pH, UA: 7 (ref 5.0–8.0)

## 2017-06-15 MED ORDER — CIPROFLOXACIN HCL 500 MG PO TABS: 500.0000 mg | ORAL_TABLET | Freq: Two times a day (BID) | ORAL | 0 refills | Status: AC

## 2017-06-15 NOTE — Patient Instructions (Addendum)
Urinary Tract Infection, Adult A urinary tract infection (UTI) is an infection of any part of the urinary tract. The urinary tract includes the:  Kidneys.  Ureters.  Bladder.  Urethra.  These organs make, store, and get rid of pee (urine) in the body. Follow these instructions at home:  Take over-the-counter and prescription medicines only as told by your doctor.  If you were prescribed an antibiotic medicine, take it as told by your doctor. Do not stop taking the antibiotic even if you start to feel better.  Avoid the following drinks: ? Alcohol. ? Caffeine. ? Tea. ? Carbonated drinks.  Drink enough fluid to keep your pee clear or pale yellow.  Keep all follow-up visits as told by your doctor. This is important.  Make sure to: ? Empty your bladder often and completely. Do not to hold pee for long periods of time. ? Empty your bladder before and after sex. ? Wipe from front to back after a bowel movement if you are female. Use each tissue one time when you wipe. Contact a doctor if:  You have back pain.  You have a fever.  You feel sick to your stomach (nauseous).  You throw up (vomit).  Your symptoms do not get better after 3 days.  Your symptoms go away and then come back. Get help right away if:  You have very bad back pain.  You have very bad lower belly (abdominal) pain.  You are throwing up and cannot keep down any medicines or water. This information is not intended to replace advice given to you by your health care provider. Make sure you discuss any questions you have with your health care provider. Document Released: 04/28/2008 Document Revised: 04/17/2016 Document Reviewed: 10/01/2015 Elsevier Interactive Patient Education  2018 [employer].     IF you received an x-ray today, you will receive an invoice from [facility]. Please contact [facility] at [phone] with questions or concerns regarding your invoice.   IF you  received labwork today, you will receive an invoice from [facility]. Please contact [facility] at [phone] with questions or concerns regarding your invoice.   Our billing staff will not be able to assist you with questions regarding bills from these companies.  You will be contacted with the lab results as soon as they are available. The fastest way to get your results is to activate your My Chart account. Instructions are located on the last page of this paperwork. If you have not heard from us regarding the results in 2 weeks, please contact this office.      

## 2017-06-15 NOTE — Progress Notes (Signed)
PRIMARY CARE AT [facility] [address], [place] [address] [phone]  Date:  06/15/2017   Name:  Doris Wilkerson   DOB:  1946-11-17   MRN:  [id number]  PCP:  [name], MD    History of Present Illness:  Doris Wilkerson is a 71 y.o. female patient who presents to PCP with  Chief Complaint  Patient presents with  . Urinary Frequency    and feels like pt isn't emptying bladder  x2days      Urinary frequency with very little urination for the last 2 days.   She has some mild discomfort of her right lower pelvic.   No back pain.   No fever or nausea. No abnormal vaginal discharge or itching.     Patient Active Problem List   Diagnosis Date Noted  . Pain in joint, ankle and foot 02/14/2013  . Foot fracture 02/14/2013  . CAD (coronary artery disease) 12/09/2011  . Dyslipidemia 12/09/2011  . Osteopenia 12/09/2011  . Hypothyroid 12/09/2011  . Asthma 12/09/2011    Past Medical History:  Diagnosis Date  . Hyperlipidemia   . Hypertension   . Thyroid disease     Past Surgical History:  Procedure Laterality Date  . EYE SURGERY    . ORTHOPEDIC SURGERY  2012  . TONSILLECTOMY  1951    Social History  Substance Use Topics  . Smoking status: Never Smoker  . Smokeless tobacco: Never Used  . Alcohol use 1.2 oz/week    2 Standard drinks or equivalent per week     Comment: 1    Family History  Problem Relation Age of Onset  . Cancer Father   . Heart disease Brother   . Dementia Brother     Allergies  Allergen Reactions  . Plavix [Clopidogrel Bisulfate]     Medication list has been reviewed and updated.  Current Outpatient Prescriptions on File Prior to Visit  Medication Sig Dispense Refill  . aspirin 81 MG tablet Take 81 mg by mouth daily.     Marland Kitchen atorvastatin (LIPITOR) 40 MG tablet TAKE 1 TABLET BY MOUTH DAILY 90 tablet 0  . benazepril (LOTENSIN) 10 MG tablet Take 1 tablet (10 mg total) by mouth daily. Take 1 tablet by mouth  Daily  Office visit needed for  additional refills 90 tablet 0  . cephALEXin (KEFLEX) 500 MG capsule Take 1 capsule (500 mg total) by mouth 3 (three) times daily. 21 capsule 0  . levothyroxine (SYNTHROID, LEVOTHROID) 25 MCG tablet Take 1 tablet (25 mcg total) by mouth daily before breakfast. Take 1 tablet by mouth  daily before breakfast   appt needed for refills 90 tablet 0  . OVER THE COUNTER MEDICATION Vitamin D 2000iu daily.     No current facility-administered medications on file prior to visit.     ROS ROS otherwise unremarkable unless listed above.  Physical Examination: BP 107/69   Pulse 79   Temp 98.7 F (37.1 C) (Oral)   Resp 18   Ht 5\' 6"  (1.676 m)   Wt 156 lb 3.2 oz (70.9 kg)   SpO2 99%   BMI 25.21 kg/m  Ideal Body Weight: Weight in (lb) to have BMI = 25: 154.6  Physical Exam  Results for orders placed or performed in visit on 06/15/17  POCT urinalysis dipstick  Result Value Ref Range   Color, UA yellow yellow   Clarity, UA cloudy (A) clear   Glucose, UA negative negative mg/dL   Bilirubin, UA  negative negative   Ketones, POC UA negative negative mg/dL   Spec Grav, UA 1.015 1.010 - 1.025   Blood, UA moderate (A) negative   pH, UA 7.0 5.0 - 8.0   Protein Ur, POC =30 (A) negative mg/dL   Urobilinogen, UA 0.2 0.2 or 1.0 E.U./dL   Nitrite, UA Negative Negative   Leukocytes, UA Moderate (2+) (A) Negative    Assessment and Plan: Doris Wilkerson is a 71 y.o. female who is here today for frequency. Dysuria - Plan: POCT urinalysis dipstick, Urine Culture  Urinary frequency - Plan: Urine Culture, CANCELED: POCT Microscopic Urinalysis ([facility])  Suprapubic pain - Plan: Urine Culture  Cystitis - Plan: ciprofloxacin (CIPRO) 500 MG tablet  Ivar Drape, PA-C [facility] 7/28/20188:44 AM

## 2017-06-15 NOTE — Telephone Encounter (Signed)
Pt is coming back for her pap smear with English at the end of October.  Pt states that [name] was wanting her to do blood work but is wondering if she could do it at the same time as her pap or if it is too soon or too late.  Please advise if she needs another appt for lab work. [phone]

## 2017-06-17 NOTE — Telephone Encounter (Signed)
Please advise 

## 2017-06-18 LAB — URINE CULTURE

## 2017-06-18 MED ORDER — LEVOTHYROXINE SODIUM 25 MCG PO TABS: 25.0000 ug | ORAL_TABLET | Freq: Every day | ORAL | 0 refills | Status: DC

## 2017-06-18 MED ORDER — ATORVASTATIN CALCIUM 40 MG PO TABS: 40.0000 mg | ORAL_TABLET | Freq: Every day | ORAL | 0 refills | Status: DC

## 2017-06-18 MED ORDER — BENAZEPRIL HCL 10 MG PO TABS: 10.0000 mg | ORAL_TABLET | Freq: Every day | ORAL | 0 refills | Status: DC

## 2017-06-18 NOTE — Telephone Encounter (Signed)
I am fine with her waiting until October to have blood work for thyroid, blood pressure and cholesterol at visit with Colletta [place]. Based on the appointment date with Ivar Drape, she may run out of medication prior to that appointment, so I sent in another 90 day refill of her Lipitor, Lotensin, and Synthroid. I have sent her a my chart message with this information.

## 2017-06-29 DIAGNOSIS — H2511 Age-related nuclear cataract, right eye: Secondary | ICD-10-CM | POA: Diagnosis not present

## 2017-06-30 DIAGNOSIS — H2512 Age-related nuclear cataract, left eye: Secondary | ICD-10-CM | POA: Diagnosis not present

## 2017-07-20 DIAGNOSIS — H2512 Age-related nuclear cataract, left eye: Secondary | ICD-10-CM | POA: Diagnosis not present

## 2017-08-03 ENCOUNTER — Ambulatory Visit: Payer: Medicare Other | Admitting: Physician Assistant

## 2017-08-05 ENCOUNTER — Encounter: Payer: Self-pay | Admitting: Physician Assistant

## 2017-08-27 ENCOUNTER — Telehealth: Payer: Self-pay

## 2017-08-27 NOTE — Telephone Encounter (Signed)
Called pt to schedule Medicare Annual Wellness Visit, prior to upcoming visit with PCP.    Josepha Pigg, B.A.  Care Guide [phone]

## 2017-09-03 ENCOUNTER — Telehealth: Payer: Self-pay

## 2017-09-03 NOTE — Telephone Encounter (Signed)
Called pt to re-schedule Medicare Annual Wellness Visit for tomorrow 09/04/17 due to inclement weather.    Josepha Pigg, B.A.  Care Guide [phone]

## 2017-09-04 ENCOUNTER — Ambulatory Visit: Payer: Medicare Other

## 2017-09-04 ENCOUNTER — Telehealth: Payer: Self-pay

## 2017-09-04 DIAGNOSIS — Z0000 Encounter for general adult medical examination without abnormal findings: Secondary | ICD-10-CM | POA: Diagnosis not present

## 2017-09-04 NOTE — Telephone Encounter (Signed)
Called pt to reschedule her AWV due to inclement weather for provider. Two telephone outreach attempts yesterday evening and this morning but was not able to reach patient and was not able to leave a vm due to the vm box being full.    Josepha Pigg, B.A.  Care Guide [phone]

## 2017-09-04 NOTE — Addendum Note (Signed)
Addended by: Gari Crown D on: 09/04/2017 03:36 PM   Modules accepted: Orders

## 2017-09-06 LAB — COMPREHENSIVE METABOLIC PANEL
A/G RATIO: 2.2 (ref 1.2–2.2)
ALT: 17 IU/L (ref 0–32)
AST: 22 IU/L (ref 0–40)
Albumin: 4.7 g/dL (ref 3.5–4.8)
Alkaline Phosphatase: 74 IU/L (ref 39–117)
BILIRUBIN TOTAL: 0.5 mg/dL (ref 0.0–1.2)
BUN/Creatinine Ratio: 22 (ref 12–28)
BUN: 16 mg/dL (ref 8–27)
CO2: 23 mmol/L (ref 20–29)
Calcium: 9.9 mg/dL (ref 8.7–10.3)
Chloride: 105 mmol/L (ref 96–106)
Creatinine, Ser: 0.73 mg/dL (ref 0.57–1.00)
GFR calc non Af Amer: 83 mL/min/{1.73_m2} (ref >59)
GFR, EST AFRICAN AMERICAN: 96 mL/min/{1.73_m2} (ref >59)
GLUCOSE: 91 mg/dL (ref 65–99)
Globulin, Total: 2.1 g/dL (ref 1.5–4.5)
POTASSIUM: 4.4 mmol/L (ref 3.5–5.2)
Sodium: 143 mmol/L (ref 134–144)
TOTAL PROTEIN: 6.8 g/dL (ref 6.0–8.5)

## 2017-09-06 LAB — CBC WITH DIFFERENTIAL/PLATELET
BASOS ABS: 0.1 10*3/uL (ref 0.0–0.2)
Basos: 1 %
EOS (ABSOLUTE): 0.2 10*3/uL (ref 0.0–0.4)
EOS: 4 %
HEMATOCRIT: 38.5 % (ref 34.0–46.6)
Hemoglobin: 12.7 g/dL (ref 11.1–15.9)
Immature Grans (Abs): 0 10*3/uL (ref 0.0–0.1)
Immature Granulocytes: 0 %
LYMPHS ABS: 1.8 10*3/uL (ref 0.7–3.1)
Lymphs: 33 %
MCH: 28.7 pg (ref 26.6–33.0)
MCHC: 33 g/dL (ref 31.5–35.7)
MCV: 87 fL (ref 79–97)
MONOS ABS: 0.1 10*3/uL (ref 0.1–0.9)
Monocytes: 2 %
NEUTROS ABS: 3.3 10*3/uL (ref 1.4–7.0)
Neutrophils: 60 %
Platelets: 296 10*3/uL (ref 150–379)
RBC: 4.43 x10E6/uL (ref 3.77–5.28)
RDW: 14.3 % (ref 12.3–15.4)
WBC: 5.4 10*3/uL (ref 3.4–10.8)

## 2017-09-06 LAB — LIPID PANEL
CHOL/HDL RATIO: 2.5 ratio (ref 0.0–4.4)
Cholesterol, Total: 168 mg/dL (ref 100–199)
HDL: 68 mg/dL (ref >39)
LDL Calculated: 87 mg/dL (ref 0–99)
Triglycerides: 64 mg/dL (ref 0–149)
VLDL CHOLESTEROL CAL: 13 mg/dL (ref 5–40)

## 2017-09-06 LAB — TSH: TSH: 1.5 u[IU]/mL (ref 0.450–4.500)

## 2017-09-07 ENCOUNTER — Ambulatory Visit (INDEPENDENT_AMBULATORY_CARE_PROVIDER_SITE_OTHER): Payer: Medicare Other

## 2017-09-07 VITALS — BP 114/72 | HR 82 | Temp 98.7°F | Ht 66.0 in | Wt 156.4 lb

## 2017-09-07 DIAGNOSIS — E2839 Other primary ovarian failure: Secondary | ICD-10-CM | POA: Diagnosis not present

## 2017-09-07 DIAGNOSIS — Z1211 Encounter for screening for malignant neoplasm of colon: Secondary | ICD-10-CM

## 2017-09-07 DIAGNOSIS — Z0000 Encounter for general adult medical examination without abnormal findings: Secondary | ICD-10-CM | POA: Diagnosis not present

## 2017-09-07 NOTE — Progress Notes (Signed)
Subjective:   Doris Wilkerson is a 71 y.o. female who presents for Medicare Annual (Subsequent) preventive examination.  Review of Systems:  N/A Cardiac Risk Factors include: advanced age (>2men, >86 women);dyslipidemia     Objective:     Vitals: BP 114/72   Pulse 82   Temp 98.7 F (37.1 C) (Oral)   Ht 5\' 6"  (1.676 m)   Wt 156 lb 6 oz (70.9 kg)   SpO2 96%   BMI 25.24 kg/m   Body mass index is 25.24 kg/m.   Tobacco History  Smoking Status  . Never Smoker  Smokeless Tobacco  . Never Used     Counseling given: Not Answered   Past Medical History:  Diagnosis Date  . Hyperlipidemia   . Hypertension   . Thyroid disease    Past Surgical History:  Procedure Laterality Date  . EYE SURGERY    . ORTHOPEDIC SURGERY  2012  . TONSILLECTOMY  1951   Family History  Problem Relation Age of Onset  . Cancer Father   . Heart disease Brother   . Dementia Brother    History  Sexual Activity  . Sexual activity: Not on file    Outpatient Encounter Prescriptions as of 09/07/2017  Medication Sig  . aspirin 81 MG tablet Take 81 mg by mouth daily.   Marland Kitchen atorvastatin (LIPITOR) 40 MG tablet Take 1 tablet (40 mg total) by mouth daily.  . benazepril (LOTENSIN) 10 MG tablet Take 1 tablet (10 mg total) by mouth daily. Take 1 tablet by mouth  Daily  . levothyroxine (SYNTHROID, LEVOTHROID) 25 MCG tablet Take 1 tablet (25 mcg total) by mouth daily before breakfast. Take 1 tablet by mouth  daily before breakfast  . OVER THE COUNTER MEDICATION Vitamin D 2000iu daily.   No facility-administered encounter medications on file as of 09/07/2017.     Activities of Daily Living In your present state of health, do you have any difficulty performing the following activities: 09/07/2017  Hearing? N  Vision? N  Difficulty concentrating or making decisions? N  Walking or climbing stairs? N  Dressing or bathing? N  Doing errands, shopping? N  Preparing Food and eating ? N  Using the Toilet? N    In the past six months, have you accidently leaked urine? N  Do you have problems with loss of bowel control? N  Managing your Medications? N  Managing your Finances? N  Housekeeping or managing your Housekeeping? N  Some recent data might be hidden    Patient Care Team: [name], MD as PCP - General (Family Medicine) [name], MD as Consulting Physician (Cardiology) [name], MD (Ophthalmology)    Assessment:    Exercise Activities and Dietary recommendations Current Exercise Habits: The patient does not participate in regular exercise at present, Exercise limited by: None identified  Goals    . Eat more fruits and vegetables          Patient is currently trying to eat more fruits and vegetables to help improve her diet and portion control.       Fall Risk Fall Risk  09/07/2017 06/15/2017 03/16/2017 03/11/2017 02/17/2017  Falls in the past year? No No No No No   Depression Screen PHQ 2/9 Scores 09/07/2017 06/15/2017 03/16/2017 03/11/2017  PHQ - 2 Score 0 0 0 0     Cognitive Function     6CIT Screen 09/07/2017  What Year? 0 points  What month? 0 points  What time?  0 points  Count back from 20 0 points  Months in reverse 0 points  Repeat phrase 0 points  Total Score 0    Immunization History  Administered Date(s) Administered  . Hepatitis A 07/14/2006, 04/02/2007  . IPV 07/14/2006  . Influenza, Seasonal, Injecte, Preservative Fre 12/13/2012  . Influenza,inj,Quad PF,6+ Mos 09/12/2013, 10/16/2014, 09/24/2015, 08/25/2016  . Pneumococcal Conjugate-13 05/14/2015  . Pneumococcal Polysaccharide-23 10/15/2013  . Tdap 07/14/2006  . Typhoid Parenteral 07/14/2006  . Zoster 11/23/2008   Screening Tests Health Maintenance  Topic Date Due  . COLONOSCOPY  11/24/2014  . INFLUENZA VACCINE  06/24/2017  . Hepatitis C Screening  09/07/2018 (Originally 19-Jan-1946)  . TETANUS/TDAP  11/24/2017  . MAMMOGRAM  01/21/2018  . DEXA SCAN  Completed  . PNA vac Low Risk  Adult  Completed      Plan:   I have personally reviewed and noted the following in the patient's chart:   . Medical and social history . Use of alcohol, tobacco or illicit drugs  . Current medications and supplements . Functional ability and status . Nutritional status . Physical activity . Advanced directives . List of other physicians . Hospitalizations, surgeries, and ER visits in previous 12 months . Vitals . Screenings to include cognitive, depression, and falls . Referrals and appointments  In addition, I have reviewed and discussed with patient certain preventive protocols, quality metrics, and best practice recommendations. A written personalized care plan for preventive services as well as general preventive health recommendations were provided to patient.  Patient declined Hep C screen. Colonoscopy and bone density ordered. Flu vaccine is currently out. Patient will check back tomorrow to see if office has some in.  Andrez Grime, [place]  [phone]

## 2017-09-07 NOTE — Patient Instructions (Addendum)
Doris Wilkerson , Thank you for taking time to come for your Medicare Wellness Visit. I appreciate your ongoing commitment to your health goals. Please review the following plan we discussed and let me know if I can assist you in the future.   Screening recommendations/referrals: Colonoscopy: due, [facility] will contact you to have this setup Mammogram: up to date, next due 01/22/2018 Bone Density: due, [facility] will contact [facility] about appointment Recommended yearly ophthalmology/optometry visit for glaucoma screening and checkup Recommended yearly dental visit for hygiene and checkup  Vaccinations: Influenza vaccine: Currently out of flu vaccine, Check back tomorrow to see if our shipment comes in.  Pneumococcal vaccine: up to date Tdap vaccine: due, declined due to insurance Shingles vaccine: up to date    Advanced directives: Please bring a copy of your POA (Power of [place]) and/or Living Will to your next appointment.   Conditions/risks identified: Continue trying to eat more fruits and vegetables to help improve your diet and portion control.   Next appointment: 09/14/17 @ 3 pm with Ivar Drape    Preventive Care 14 Years and Older, Female Preventive care refers to lifestyle choices and visits with your health care provider that can promote health and wellness. What does preventive care include?  A yearly physical exam. This is also called an annual well check.  Dental exams once or twice a year.  Routine eye exams. Ask your health care provider how often you should have your eyes checked.  Personal lifestyle choices, including:  Daily care of your teeth and gums.  Regular physical activity.  Eating a healthy diet.  Avoiding tobacco and drug use.  Limiting alcohol use.  Practicing safe sex.  Taking low-dose aspirin every day.  Taking vitamin and mineral supplements as recommended by your health care provider. What happens during an annual well check? The  services and screenings done by your health care provider during your annual well check will depend on your age, overall health, lifestyle risk factors, and family history of disease. Counseling  Your health care provider may ask you questions about your:  Alcohol use.  Tobacco use.  Drug use.  Emotional well-being.  Home and relationship well-being.  Sexual activity.  Eating habits.  History of falls.  Memory and ability to understand (cognition).  Work and work Statistician.  Reproductive health. Screening  You may have the following tests or measurements:  Height, weight, and BMI.  Blood pressure.  Lipid and cholesterol levels. These may be checked every 5 years, or more frequently if you are over 32 years old.  Skin check.  Lung cancer screening. You may have this screening every year starting at age 9 if you have a 30-pack-year history of smoking and currently smoke or have quit within the past 15 years.  Fecal occult blood test (FOBT) of the stool. You may have this test every year starting at age 42.  Flexible sigmoidoscopy or colonoscopy. You may have a sigmoidoscopy every 5 years or a colonoscopy every 10 years starting at age 52.  Hepatitis C blood test.  Hepatitis B blood test.  Sexually transmitted disease (STD) testing.  Diabetes screening. This is done by checking your blood sugar (glucose) after you have not eaten for a while (fasting). You may have this done every 1-3 years.  Bone density scan. This is done to screen for osteoporosis. You may have this done starting at age 51.  Mammogram. This may be done every 1-2 years. Talk to your health care  provider about how often you should have regular mammograms. Talk with your health care provider about your test results, treatment options, and if necessary, the need for more tests. Vaccines  Your health care provider may recommend certain vaccines, such as:  Influenza vaccine. This is recommended  every year.  Tetanus, diphtheria, and acellular pertussis (Tdap, Td) vaccine. You may need a Td booster every 10 years.  Zoster vaccine. You may need this after age 46.  Pneumococcal 13-valent conjugate (PCV13) vaccine. One dose is recommended after age 84.  Pneumococcal polysaccharide (PPSV23) vaccine. One dose is recommended after age 4. Talk to your health care provider about which screenings and vaccines you need and how often you need them. This information is not intended to replace advice given to you by your health care provider. Make sure you discuss any questions you have with your health care provider. Document Released: 12/07/2015 Document Revised: 07/30/2016 Document Reviewed: 09/11/2015 Elsevier Interactive Patient Education  2017 [place] Prevention in the Home Falls can cause injuries. They can happen to people of all ages. There are many things you can do to make your home safe and to help prevent falls. What can I do on the outside of my home?  Regularly fix the edges of walkways and driveways and fix any cracks.  Remove anything that might make you trip as you walk through a door, such as a raised step or threshold.  Trim any bushes or trees on the path to your home.  Use bright outdoor lighting.  Clear any walking paths of anything that might make someone trip, such as rocks or tools.  Regularly check to see if handrails are loose or broken. Make sure that both sides of any steps have handrails.  Any raised decks and porches should have guardrails on the edges.  Have any leaves, snow, or ice cleared regularly.  Use sand or salt on walking paths during winter.  Clean up any spills in your garage right away. This includes oil or grease spills. What can I do in the bathroom?  Use night lights.  Install grab bars by the toilet and in the tub and shower. Do not use towel bars as grab bars.  Use non-skid mats or decals in the tub or shower.  If  you need to sit down in the shower, use a plastic, non-slip stool.  Keep the floor dry. Clean up any water that spills on the floor as soon as it happens.  Remove soap buildup in the tub or shower regularly.  Attach bath mats securely with double-sided non-slip rug tape.  Do not have throw rugs and other things on the floor that can make you trip. What can I do in the bedroom?  Use night lights.  Make sure that you have a light by your bed that is easy to reach.  Do not use any sheets or blankets that are too big for your bed. They should not hang down onto the floor.  Have a firm chair that has side arms. You can use this for support while you get dressed.  Do not have throw rugs and other things on the floor that can make you trip. What can I do in the kitchen?  Clean up any spills right away.  Avoid walking on wet floors.  Keep items that you use a lot in easy-to-reach places.  If you need to reach something above you, use a strong step stool that has a grab bar.  Keep electrical cords out of the way.  Do not use floor polish or wax that makes floors slippery. If you must use wax, use non-skid floor wax.  Do not have throw rugs and other things on the floor that can make you trip. What can I do with my stairs?  Do not leave any items on the stairs.  Make sure that there are handrails on both sides of the stairs and use them. Fix handrails that are broken or loose. Make sure that handrails are as long as the stairways.  Check any carpeting to make sure that it is firmly attached to the stairs. Fix any carpet that is loose or worn.  Avoid having throw rugs at the top or bottom of the stairs. If you do have throw rugs, attach them to the floor with carpet tape.  Make sure that you have a light switch at the top of the stairs and the bottom of the stairs. If you do not have them, ask someone to add them for you. What else can I do to help prevent falls?  Wear shoes  that:  Do not have high heels.  Have rubber bottoms.  Are comfortable and fit you well.  Are closed at the toe. Do not wear sandals.  If you use a stepladder:  Make sure that it is fully opened. Do not climb a closed stepladder.  Make sure that both sides of the stepladder are locked into place.  Ask someone to hold it for you, if possible.  Clearly mark and make sure that you can see:  Any grab bars or handrails.  First and last steps.  Where the edge of each step is.  Use tools that help you move around (mobility aids) if they are needed. These include:  Canes.  Walkers.  Scooters.  Crutches.  Turn on the lights when you go into a dark area. Replace any light bulbs as soon as they burn out.  Set up your furniture so you have a clear path. Avoid moving your furniture around.  If any of your floors are uneven, fix them.  If there are any pets around you, be aware of where they are.  Review your medicines with your doctor. Some medicines can make you feel dizzy. This can increase your chance of falling. Ask your doctor what other things that you can do to help prevent falls. This information is not intended to replace advice given to you by your health care provider. Make sure you discuss any questions you have with your health care provider. Document Released: 09/06/2009 Document Revised: 04/17/2016 Document Reviewed: 12/15/2014 Elsevier Interactive Patient Education  2017 [employer].

## 2017-09-10 ENCOUNTER — Other Ambulatory Visit: Payer: Self-pay | Admitting: Family Medicine

## 2017-09-10 DIAGNOSIS — E782 Mixed hyperlipidemia: Secondary | ICD-10-CM

## 2017-09-14 ENCOUNTER — Other Ambulatory Visit: Payer: Self-pay | Admitting: Family Medicine

## 2017-09-14 ENCOUNTER — Encounter: Payer: Self-pay | Admitting: Gastroenterology

## 2017-09-14 ENCOUNTER — Ambulatory Visit: Payer: Medicare Other | Admitting: Physician Assistant

## 2017-09-14 DIAGNOSIS — Z1231 Encounter for screening mammogram for malignant neoplasm of breast: Secondary | ICD-10-CM

## 2017-09-15 ENCOUNTER — Other Ambulatory Visit: Payer: Self-pay | Admitting: Family Medicine

## 2017-09-15 DIAGNOSIS — I10 Essential (primary) hypertension: Secondary | ICD-10-CM

## 2017-09-16 ENCOUNTER — Ambulatory Visit (INDEPENDENT_AMBULATORY_CARE_PROVIDER_SITE_OTHER): Payer: Medicare Other | Admitting: Physician Assistant

## 2017-09-16 ENCOUNTER — Encounter: Payer: Self-pay | Admitting: Physician Assistant

## 2017-09-16 VITALS — BP 128/70 | HR 89 | Temp 98.3°F | Resp 16 | Ht 66.0 in | Wt 157.0 lb

## 2017-09-16 DIAGNOSIS — I10 Essential (primary) hypertension: Secondary | ICD-10-CM

## 2017-09-16 DIAGNOSIS — Z23 Encounter for immunization: Secondary | ICD-10-CM | POA: Diagnosis not present

## 2017-09-16 DIAGNOSIS — Z124 Encounter for screening for malignant neoplasm of cervix: Secondary | ICD-10-CM | POA: Diagnosis not present

## 2017-09-16 DIAGNOSIS — Z1159 Encounter for screening for other viral diseases: Secondary | ICD-10-CM | POA: Diagnosis not present

## 2017-09-16 MED ORDER — BENAZEPRIL HCL 10 MG PO TABS: 10.0000 mg | ORAL_TABLET | Freq: Every day | ORAL | 0 refills | Status: DC

## 2017-09-16 NOTE — Patient Instructions (Addendum)
You may have a little bit of spotting after a pap smear Your lab results from your last visit looked great.  Kidney and liver function appeared normal.  Glucose and electrolytes were within normal range.  Your cholesterol and triglycerides appeared normal.  No anemia or change in blood cells.    IF you received an x-ray today, you will receive an invoice from [facility]. Please contact [facility] at [phone] with questions or concerns regarding your invoice.   IF you received labwork today, you will receive an invoice from [place]. Please contact [facility] at [phone] with questions or concerns regarding your invoice.   Our billing staff will not be able to assist you with questions regarding bills from these companies.  You will be contacted with the lab results as soon as they are available. The fastest way to get your results is to activate your My Chart account. Instructions are located on the last page of this paperwork. If you have not heard from [place] regarding the results in 2 weeks, please contact this office.

## 2017-09-16 NOTE — Progress Notes (Signed)
PRIMARY CARE AT [facility] [address] [place] [address] [phone]  Date:  09/16/2017   Name:  Doris Wilkerson   DOB:  1946/04/23   MRN:  [id number]  PCP:  [name], MD    History of Present Illness:  Doris Wilkerson is a 71 y.o. female patient who presents to PCP with  Chief Complaint  Patient presents with  . pap smear  . blood work    possible Hep C     Patient is here for pap smear.  She states that she was scheduled for one but did not know why.  She reports that she has not had one for many years, >10 years.  She had one abnormal pap smear, which resulted in a second pap, and was then normal (living in [place]??).  She has never been pregnant, and has no children.  No menstrual issues.  Last period was more than 10 years ago, and she does not recall the date.   Would like her hep c screening.  No intravenous use, or tattoos Patient Active Problem List   Diagnosis Date Noted  . Pain in joint, ankle and foot 02/14/2013  . Foot fracture 02/14/2013  . CAD (coronary artery disease) 12/09/2011  . Dyslipidemia 12/09/2011  . Osteopenia 12/09/2011  . Hypothyroid 12/09/2011  . Asthma 12/09/2011    Past Medical History:  Diagnosis Date  . Hyperlipidemia   . Hypertension   . Thyroid disease     Past Surgical History:  Procedure Laterality Date  . EYE SURGERY    . ORTHOPEDIC SURGERY  2012  . TONSILLECTOMY  1951    Social History  Substance Use Topics  . Smoking status: Never Smoker  . Smokeless tobacco: Never Used  . Alcohol use 1.2 oz/week    2 Standard drinks or equivalent per week     Comment: occassionally    Family History  Problem Relation Age of Onset  . Cancer Father   . Heart disease Brother   . Dementia Brother     Allergies  Allergen Reactions  . Plavix [Clopidogrel Bisulfate]     Medication list has been reviewed and updated.  Current Outpatient Prescriptions on File Prior to Visit  Medication Sig Dispense Refill  . aspirin 81 MG  tablet Take 81 mg by mouth daily.     Marland Kitchen atorvastatin (LIPITOR) 40 MG tablet Take 1 tablet (40 mg total) by mouth daily. 90 tablet 0  . atorvastatin (LIPITOR) 40 MG tablet TAKE 1 TABLET BY MOUTH DAILY 30 tablet 0  . levothyroxine (SYNTHROID, LEVOTHROID) 25 MCG tablet Take 1 tablet (25 mcg total) by mouth daily before breakfast. Take 1 tablet by mouth  daily before breakfast 90 tablet 0  . OVER THE COUNTER MEDICATION Vitamin D 2000iu daily.     No current facility-administered medications on file prior to visit.     ROS ROS otherwise unremarkable unless listed above.  Physical Examination: BP 128/70   Pulse 89   Temp 98.3 F (36.8 C) (Oral)   Resp 16   Ht 5\' 6"  (1.676 m)   Wt 157 lb (71.2 kg)   SpO2 98%   BMI 25.34 kg/m  Ideal Body Weight: Weight in (lb) to have BMI = 25: 154.6  Physical Exam  Constitutional: She is oriented to person, place, and time. She appears well-developed and well-nourished. No distress.  HENT:  Head: Normocephalic and atraumatic.  Right Ear: External ear normal.  Left Ear: External ear normal.  Eyes: Pupils are equal, round, and reactive to light. Conjunctivae and EOM are normal.  Cardiovascular: Normal rate.   Pulmonary/Chest: Effort normal. No respiratory distress.  Genitourinary: Uterus normal. Pelvic exam was performed with patient supine. There is no rash on the right labia. There is no rash on the left labia. Uterus is not enlarged, not fixed and not tender. Cervix exhibits no motion tenderness, no discharge and no friability. Right adnexum displays no mass and no tenderness. Left adnexum displays no mass and no tenderness.  Genitourinary Comments: Cervix appears closed Diminished rugae, consistent with age.  Neurological: She is alert and oriented to person, place, and time.  Skin: She is not diaphoretic.  Psychiatric: She has a normal mood and affect. Her behavior is normal.     Assessment and Plan: Doris Wilkerson is a 71 y.o. female who is here  today for cc of  Chief Complaint  Patient presents with  . pap smear  . blood work    possible Hep C  BP stable, refilled may fill for additional 3 months.   Hep c performed today. Need for hepatitis C screening test - Plan: Hepatitis C antibody  Essential hypertension - Plan: benazepril (LOTENSIN) 10 MG tablet  Screening for cervical cancer - Plan: Pap IG w/ reflex to HPV when ASC-U  Need for influenza vaccination - Plan: Flu Vaccine QUAD 6+ mos PF IM (Fluarix Quad PF)  Ivar Drape, PA-C [facility] 10/24/20189:52 AM

## 2017-09-17 LAB — HEPATITIS C ANTIBODY: Hep C Virus Ab: <0.1 s/co ratio (ref 0.0–0.9)

## 2017-09-18 LAB — PAP IG W/ RFLX HPV ASCU: PAP Smear Comment: 0

## 2017-10-06 ENCOUNTER — Other Ambulatory Visit: Payer: Self-pay | Admitting: Family Medicine

## 2017-10-06 DIAGNOSIS — E039 Hypothyroidism, unspecified: Secondary | ICD-10-CM

## 2017-10-08 ENCOUNTER — Ambulatory Visit
Admission: RE | Admit: 2017-10-08 | Discharge: 2017-10-08 | Disposition: A | Discharge status: Home / Self Care | Payer: Medicare Other | Source: Ambulatory Visit | Attending: Family Medicine | Admitting: Family Medicine

## 2017-10-08 DIAGNOSIS — Z1231 Encounter for screening mammogram for malignant neoplasm of breast: Secondary | ICD-10-CM

## 2017-10-08 DIAGNOSIS — M8589 Other specified disorders of bone density and structure, multiple sites: Secondary | ICD-10-CM | POA: Diagnosis not present

## 2017-10-08 DIAGNOSIS — Z780 Asymptomatic menopausal state: Secondary | ICD-10-CM | POA: Diagnosis not present

## 2017-10-14 ENCOUNTER — Other Ambulatory Visit: Payer: Self-pay | Admitting: Family Medicine

## 2017-10-14 DIAGNOSIS — E782 Mixed hyperlipidemia: Secondary | ICD-10-CM

## 2017-10-27 ENCOUNTER — Other Ambulatory Visit: Payer: Self-pay

## 2017-10-27 ENCOUNTER — Ambulatory Visit (AMBULATORY_SURGERY_CENTER): Payer: Self-pay

## 2017-10-27 VITALS — Ht 66.0 in | Wt 160.8 lb

## 2017-10-27 DIAGNOSIS — Z1211 Encounter for screening for malignant neoplasm of colon: Secondary | ICD-10-CM

## 2017-10-27 MED ORDER — PEG-KCL-NACL-NASULF-NA ASC-C 140 G PO SOLR: 1.0000 | Freq: Once | ORAL | 0 refills | Status: AC

## 2017-10-27 NOTE — Progress Notes (Signed)
Denies allergies to eggs or soy products. Denies complication of anesthesia or sedation. Denies use of weight loss medication. Denies use of O2.   [place] instructions given for colonoscopy.  

## 2017-11-10 ENCOUNTER — Ambulatory Visit (AMBULATORY_SURGERY_CENTER): Payer: Medicare Other | Admitting: Gastroenterology

## 2017-11-10 ENCOUNTER — Other Ambulatory Visit: Payer: Self-pay

## 2017-11-10 ENCOUNTER — Encounter: Payer: Self-pay | Admitting: Gastroenterology

## 2017-11-10 VITALS — BP 119/60 | HR 61 | Temp 98.9°F | Resp 13 | Ht 66.0 in | Wt 160.0 lb

## 2017-11-10 DIAGNOSIS — Z1211 Encounter for screening for malignant neoplasm of colon: Secondary | ICD-10-CM | POA: Diagnosis not present

## 2017-11-10 DIAGNOSIS — Z1212 Encounter for screening for malignant neoplasm of rectum: Secondary | ICD-10-CM | POA: Diagnosis not present

## 2017-11-10 MED ORDER — SODIUM CHLORIDE 0.9 % IV SOLN: 500.0000 mL | Freq: Once | INTRAVENOUS | Status: DC

## 2017-11-10 NOTE — Patient Instructions (Signed)
YOU HAD AN ENDOSCOPIC PROCEDURE TODAY AT [facility]:   Refer to the procedure report that was given to you for any specific questions about what was found during the examination.  If the procedure report does not answer your questions, please call your gastroenterologist to clarify.  If you requested that your care partner not be given the details of your procedure findings, then the procedure report has been included in a sealed envelope for you to review at your convenience later.  YOU SHOULD EXPECT: Some feelings of bloating in the abdomen. Passage of more gas than usual.  Walking can help get rid of the air that was put into your GI tract during the procedure and reduce the bloating. If you had a lower endoscopy (such as a colonoscopy or flexible sigmoidoscopy) you may notice spotting of blood in your stool or on the toilet paper. If you underwent a bowel prep for your procedure, you may not have a normal bowel movement for a few days.  Please Note:  You might notice some irritation and congestion in your nose or some drainage.  This is from the oxygen used during your procedure.  There is no need for concern and it should clear up in a day or so.  SYMPTOMS TO REPORT IMMEDIATELY:   Following lower endoscopy (colonoscopy or flexible sigmoidoscopy):  Excessive amounts of blood in the stool  Significant tenderness or worsening of abdominal pains  Swelling of the abdomen that is new, acute  Fever of 100F or higher   For urgent or emergent issues, a gastroenterologist can be reached at any hour by calling [phone].  Hand outs given on hemorrhoids and diverticulosis.    DIET:  We do recommend a small meal at first, but then you may proceed to your regular diet.  Drink plenty of fluids but you should avoid alcoholic beverages for 24 hours.  ACTIVITY:  You should plan to take it easy for the rest of today and you should NOT DRIVE or use heavy machinery until tomorrow  (because of the sedation medicines used during the test).    FOLLOW UP: Our staff will call the number listed on your records the next business day following your procedure to check on you and address any questions or concerns that you may have regarding the information given to you following your procedure. If we do not reach you, we will leave a message.  However, if you are feeling well and you are not experiencing any problems, there is no need to return our call.  We will assume that you have returned to your regular daily activities without incident.  If any biopsies were taken you will be contacted by phone or by letter within the next 1-3 weeks.  Please call us at [phone] if you have not heard about the biopsies in 3 weeks.    SIGNATURES/CONFIDENTIALITY: You and/or your care partner have signed paperwork which will be entered into your electronic medical record.  These signatures attest to the fact that that the information above on your After Visit Summary has been reviewed and is understood.  Full responsibility of the confidentiality of this discharge information lies with you and/or your care-partner.

## 2017-11-10 NOTE — Op Note (Signed)
[place] Patient Name: Doris Wilkerson Procedure Date: 11/10/2017 8:41 AM MRN: [id number] Endoscopist: Mauri Pole , MD Age: 71 Referring MD:  Date of Birth: 01-24-1946 Gender: Female Account #: [id number] Procedure:                Colonoscopy Indications:              Screening for colorectal malignant neoplasm Medicines:                Monitored Anesthesia Care Procedure:                Pre-Anesthesia Assessment:                           - Prior to the procedure, a History and Physical                            was performed, and patient medications and                            allergies were reviewed. The patient's tolerance of                            previous anesthesia was also reviewed. The risks                            and benefits of the procedure and the sedation                            options and risks were discussed with the patient.                            All questions were answered, and informed consent                            was obtained. Prior Anticoagulants: The patient has                            taken no previous anticoagulant or antiplatelet                            agents. ASA Grade Assessment: II - A patient with                            mild systemic disease. After reviewing the risks                            and benefits, the patient was deemed in                            satisfactory condition to undergo the procedure.                           After obtaining informed consent, the colonoscope  was passed under direct vision. Throughout the                            procedure, the patient's blood pressure, pulse, and                            oxygen saturations were monitored continuously. The                            Colonoscope was introduced through the anus and                            advanced to the the cecum, identified by                            appendiceal orifice and  ileocecal valve. The                            colonoscopy was performed without difficulty. The                            patient tolerated the procedure well. The quality                            of the bowel preparation was excellent. The                            ileocecal valve, appendiceal orifice, and rectum                            were photographed. Scope In: 8:52:32 AM Scope Out: 9:10:53 AM Scope Withdrawal Time: 0 hours 11 minutes 46 seconds  Total Procedure Duration: 0 hours 18 minutes 21 seconds  Findings:                 The perianal and digital rectal examinations were                            normal.                           A few small-mouthed diverticula were found in the                            sigmoid colon.                           Non-bleeding internal hemorrhoids were found during                            retroflexion. The hemorrhoids were small. Complications:            No immediate complications. Estimated Blood Loss:     Estimated blood loss: none. Impression:               - Diverticulosis in the sigmoid colon.                           -  Non-bleeding internal hemorrhoids.                           - No specimens collected. Recommendation:           - Patient has a contact number available for                            emergencies. The signs and symptoms of potential                            delayed complications were discussed with the                            patient. Return to normal activities tomorrow.                            Written discharge instructions were provided to the                            patient.                           - Resume previous diet.                           - Continue present medications.                           - Await pathology results.                           - Repeat colonoscopy in 10 years for screening                            purposes. Mauri Pole, MD 11/10/2017 9:16:59  AM This report has been signed electronically.

## 2017-11-10 NOTE — Progress Notes (Signed)
Spontaneous respirations throughout. VSS. Resting comfortably. To PACU on room air. Report to  RN. 

## 2017-11-11 ENCOUNTER — Telehealth: Payer: Self-pay | Admitting: *Deleted

## 2017-11-11 NOTE — Telephone Encounter (Signed)
No answer, second call, left message to call if questions or concerns. 

## 2017-11-11 NOTE — Telephone Encounter (Signed)
No answer, left message to call if questions or concerns. 

## 2017-11-23 ENCOUNTER — Other Ambulatory Visit: Payer: Self-pay | Admitting: Physician Assistant

## 2017-11-23 DIAGNOSIS — I10 Essential (primary) hypertension: Secondary | ICD-10-CM

## 2017-11-27 DIAGNOSIS — I2510 Atherosclerotic heart disease of native coronary artery without angina pectoris: Secondary | ICD-10-CM | POA: Diagnosis not present

## 2017-11-27 DIAGNOSIS — E7800 Pure hypercholesterolemia, unspecified: Secondary | ICD-10-CM | POA: Diagnosis not present

## 2017-11-27 DIAGNOSIS — I10 Essential (primary) hypertension: Secondary | ICD-10-CM | POA: Diagnosis not present

## 2017-12-01 ENCOUNTER — Encounter: Payer: Self-pay | Admitting: Family Medicine

## 2017-12-03 NOTE — Telephone Encounter (Unsigned)
Copied from [place] [id number]. Topic: Bill or Statement - Patient/Guarantor Inquiry >> Dec 03, 2017  1:20 PM Synthia Innocent wrote: Patient name/MRN/Acct #: Doris Wilkerson  DOS: 09/04/2017 Details of issue or inquiry: Needs correct medical code(does not know which code to use) on form for [facility]. Requesting call back.  Route to appropriate [place] or [facility] Coding pool.

## 2018-01-03 ENCOUNTER — Other Ambulatory Visit: Payer: Self-pay | Admitting: Family Medicine

## 2018-01-03 DIAGNOSIS — E039 Hypothyroidism, unspecified: Secondary | ICD-10-CM

## 2018-01-04 ENCOUNTER — Encounter: Payer: Self-pay | Admitting: Family Medicine

## 2018-01-04 ENCOUNTER — Other Ambulatory Visit: Payer: Self-pay | Admitting: Family Medicine

## 2018-01-04 DIAGNOSIS — E785 Hyperlipidemia, unspecified: Secondary | ICD-10-CM

## 2018-01-04 DIAGNOSIS — E782 Mixed hyperlipidemia: Secondary | ICD-10-CM

## 2018-01-04 NOTE — Telephone Encounter (Signed)
Please advise, thank you.

## 2018-01-05 ENCOUNTER — Other Ambulatory Visit: Payer: Self-pay | Admitting: Family Medicine

## 2018-01-05 ENCOUNTER — Encounter: Payer: Self-pay | Admitting: Family Medicine

## 2018-01-05 DIAGNOSIS — E039 Hypothyroidism, unspecified: Secondary | ICD-10-CM

## 2018-01-05 MED ORDER — ATORVASTATIN CALCIUM 80 MG PO TABS: 80.0000 mg | ORAL_TABLET | Freq: Every day | ORAL | 0 refills | Status: DC

## 2018-01-05 NOTE — Telephone Encounter (Signed)
Refilled at 80mg  dose.

## 2018-01-05 NOTE — Telephone Encounter (Signed)
From: Doris Wilkerson  Sent: 01/05/2018  9:51 AM  To: Madelyn Brunner Pool  Subject: Appointment scheduled from [place]          Appointment For: Doris Wilkerson ([id number])  Visit Type: [place] OFFICE VISIT [phone])    03/15/2018  9:40 AM 20 mins. [name], MD   PCP-[facility] [place]    Patient Comments:  before appt need an order for lipid profile to check higher dosage of statin   Labs pended for provider signature.

## 2018-01-05 NOTE — Addendum Note (Signed)
Addended by: Delle Reining on: 01/05/2018 11:05 AM   Modules accepted: Orders

## 2018-01-05 NOTE — Addendum Note (Signed)
Addended by: [name] R on: 01/05/2018 03:12 PM   Modules accepted: Orders

## 2018-01-08 ENCOUNTER — Other Ambulatory Visit: Payer: Self-pay

## 2018-01-08 DIAGNOSIS — E039 Hypothyroidism, unspecified: Secondary | ICD-10-CM

## 2018-01-08 MED ORDER — LEVOTHYROXINE SODIUM 25 MCG PO TABS: 25.0000 ug | ORAL_TABLET | Freq: Every day | ORAL | 0 refills | Status: DC

## 2018-01-10 ENCOUNTER — Other Ambulatory Visit: Payer: Self-pay

## 2018-01-10 ENCOUNTER — Other Ambulatory Visit: Payer: Self-pay | Admitting: Family Medicine

## 2018-01-10 DIAGNOSIS — E039 Hypothyroidism, unspecified: Secondary | ICD-10-CM

## 2018-01-10 MED ORDER — LEVOTHYROXINE SODIUM 25 MCG PO TABS: 25.0000 ug | ORAL_TABLET | Freq: Every day | ORAL | 0 refills | Status: DC

## 2018-01-14 ENCOUNTER — Ambulatory Visit: Payer: Medicare Other | Admitting: Family Medicine

## 2018-02-18 DIAGNOSIS — E7800 Pure hypercholesterolemia, unspecified: Secondary | ICD-10-CM | POA: Diagnosis not present

## 2018-03-03 ENCOUNTER — Encounter: Payer: Self-pay | Admitting: Physician Assistant

## 2018-03-15 ENCOUNTER — Encounter: Payer: Self-pay | Admitting: Family Medicine

## 2018-03-15 ENCOUNTER — Other Ambulatory Visit: Payer: Self-pay

## 2018-03-15 ENCOUNTER — Ambulatory Visit (INDEPENDENT_AMBULATORY_CARE_PROVIDER_SITE_OTHER): Payer: Medicare Other | Admitting: Family Medicine

## 2018-03-15 VITALS — BP 130/68 | HR 64 | Temp 98.0°F | Ht 66.0 in | Wt 154.6 lb

## 2018-03-15 DIAGNOSIS — E039 Hypothyroidism, unspecified: Secondary | ICD-10-CM | POA: Diagnosis not present

## 2018-03-15 DIAGNOSIS — I10 Essential (primary) hypertension: Secondary | ICD-10-CM | POA: Diagnosis not present

## 2018-03-15 DIAGNOSIS — E782 Mixed hyperlipidemia: Secondary | ICD-10-CM

## 2018-03-15 MED ORDER — LEVOTHYROXINE SODIUM 25 MCG PO TABS: 25.0000 ug | ORAL_TABLET | Freq: Every day | ORAL | 1 refills | Status: DC

## 2018-03-15 MED ORDER — BENAZEPRIL HCL 10 MG PO TABS: 10.0000 mg | ORAL_TABLET | Freq: Every day | ORAL | 1 refills | Status: DC

## 2018-03-15 MED ORDER — ATORVASTATIN CALCIUM 80 MG PO TABS: 80.0000 mg | ORAL_TABLET | Freq: Every day | ORAL | 1 refills | Status: DC

## 2018-03-15 NOTE — Patient Instructions (Addendum)
I would recommend continuing same dose of statin for now. No other changes in meds. Thanks for coming in today.      IF you received an x-ray today, you will receive an invoice from [facility]. Please contact [facility] at [phone] with questions or concerns regarding your invoice.   IF you received labwork today, you will receive an invoice from [place]. Please contact [facility] at [phone] with questions or concerns regarding your invoice.   Our billing staff will not be able to assist you with questions regarding bills from these companies.  You will be contacted with the lab results as soon as they are available. The fastest way to get your results is to activate your My Chart account. Instructions are located on the last page of this paperwork. If you have not heard from [place] regarding the results in 2 weeks, please contact this office.

## 2018-03-15 NOTE — Progress Notes (Signed)
By signing my name below, I, Schuyler Bain, attest that this documentation has been prepared under the direction and in the presence of Dr. Ranell Patrick. [name].   Electronically Signed: Baldwin Jamaica, Medical Scribe 03/15/2018 at 9:56 AM.  Subjective:    Patient ID: Doris Wilkerson, female    DOB: [birth year], 72 y.o.   MRN: [id number] No chief complaint on file.  HPI Doris Wilkerson is a 72 y.o. female who presents to Primary Care at [facility] for a follow up and has history of hypertension, hyperlipidemia, CAD and hypothyroidism.   Hypertension: Lab Results  Component Value Date   CREATININE 0.73 09/04/2017    BP Readings from Last 3 Encounters:  03/15/18 130/68  11/10/17 119/60  09/16/17 128/70  She takes Benazopril 10mg  qd. She denies any issues or problems taking her BP mx. She does not check her BP outside of the clinic. She denies any chest pain, light headedness, and dizziness.   Hyperlipidemia: Lab Results  Component Value Date   CHOL 168 09/04/2017   HDL 68 09/04/2017   LDLCALC 87 09/04/2017   TRIG 64 09/04/2017   CHOLHDL 2.5 09/04/2017    Lab Results  Component Value Date   ALT 17 09/04/2017   AST 22 09/04/2017   ALKPHOS 74 09/04/2017   BILITOT 0.5 09/04/2017  Cholesterol panel on 02/11/18 showed Total at 165, trig at 54, HDL at 73, LDL at 81.  She takes Lipitor 80mg  qd along with Asprin 81mg  qd with hx of CAD followed by cardiologist. She also denies any muscle aches or pains while on Lipitor. She also notes walking more recently.   Hypothyroidism: Lab Results  Component Value Date   TSH 1.500 09/04/2017   On Synthroid 21mcg qd. She denies excessive feelings of being hot or cold, hair changes, skin changes, and gained weight.  Wt Readings from Last 3 Encounters:  03/15/18 154 lb 9.6 oz (70.1 kg)  11/10/17 160 lb (72.6 kg)  10/27/17 160 lb 12.8 oz (72.9 kg)     Patient Active Problem List   Diagnosis Date Noted  . Pain in joint, ankle and foot 02/14/2013  . Foot  fracture 02/14/2013  . CAD (coronary artery disease) 12/09/2011  . Dyslipidemia 12/09/2011  . Osteopenia 12/09/2011  . Hypothyroid 12/09/2011  . Asthma 12/09/2011   Past Medical History:  Diagnosis Date  . Asthma   . Cataract   . GERD (gastroesophageal reflux disease)   . Hyperlipidemia   . Hypertension   . Thyroid disease    Past Surgical History:  Procedure Laterality Date  . Cataract Surgery    . CORONARY ANGIOPLASTY WITH STENT PLACEMENT    . EYE SURGERY    . ORTHOPEDIC SURGERY  2012  . TONSILLECTOMY  1951   Allergies  Allergen Reactions  . Plavix [Clopidogrel Bisulfate]    Prior to Admission medications   Medication Sig Start Date End Date Taking? Authorizing Provider  aspirin 81 MG tablet Take 81 mg by mouth daily.    Yes Hayden Rasmussen, MD  atorvastatin (LIPITOR) 80 MG tablet Take 1 tablet (80 mg total) by mouth daily. 01/05/18  Yes [name], MD  benazepril (LOTENSIN) 10 MG tablet Take 10 mg by mouth daily.   Yes [provider]  levothyroxine (SYNTHROID, LEVOTHROID) 25 MCG tablet Take 1 tablet (25 mcg total) by mouth daily before breakfast. Take 1 tablet by mouth  daily before breakfast 01/10/18  Yes [name], MD  OVER THE COUNTER MEDICATION Vitamin  D 2000iu daily.   Yes [provider]   Social History   Socioeconomic History  . Marital status: Married    Spouse name: Not on file  . Number of children: Not on file  . Years of education: Not on file  . Highest education level: Not on file  Occupational History  . Occupation: unemployed  Social Needs  . Financial resource strain: Not on file  . Food insecurity:    Worry: Not on file    Inability: Not on file  . Transportation needs:    Medical: Not on file    Non-medical: Not on file  Tobacco Use  . Smoking status: Never Smoker  . Smokeless tobacco: Never Used  Substance and Sexual Activity  . Alcohol use: Yes    Alcohol/week: 1.2 oz    Types: 2 Standard drinks or  equivalent per week    Comment: occassionally  . Drug use: No  . Sexual activity: Not on file  Lifestyle  . Physical activity:    Days per week: Not on file    Minutes per session: Not on file  . Stress: Not on file  Relationships  . Social connections:    Talks on phone: Not on file    Gets together: Not on file    Attends religious service: Not on file    Active member of club or organization: Not on file    Attends meetings of clubs or organizations: Not on file    Relationship status: Not on file  . Intimate partner violence:    Fear of current or ex partner: Not on file    Emotionally abused: Not on file    Physically abused: Not on file    Forced sexual activity: Not on file  Other Topics Concern  . Not on file  Social History Narrative   Married. Education: college. Pt does exercise-    Review of Systems  Constitutional: Negative for fatigue and unexpected weight change.  Respiratory: Negative for chest tightness and shortness of breath.   Cardiovascular: Negative for chest pain, palpitations and leg swelling.  Gastrointestinal: Negative for abdominal pain and blood in stool.  Musculoskeletal: Negative for myalgias.  Neurological: Negative for dizziness, syncope, light-headedness and headaches.       Objective:   Physical Exam  Constitutional: She is oriented to person, place, and time. She appears well-developed and well-nourished.  HENT:  Head: Normocephalic and atraumatic.  Eyes: Pupils are equal, round, and reactive to light. Conjunctivae and EOM are normal.  Neck: Carotid bruit is not present. No thyromegaly present.  Cardiovascular: Normal rate, regular rhythm, normal heart sounds and intact distal pulses.  Pulmonary/Chest: Effort normal and breath sounds normal.  Abdominal: Soft. She exhibits no pulsatile midline mass. There is no tenderness.  Neurological: She is alert and oriented to person, place, and time.  Skin: Skin is warm and dry.  Psychiatric:  She has a normal mood and affect. Her behavior is normal.  Vitals reviewed.   Vitals:   03/15/18 0959  BP: 130/68  Pulse: 64  Temp: 98 F (36.7 C)  TempSrc: Oral  SpO2: 99%  Weight: 154 lb 9.6 oz (70.1 kg)  Height: 5\' 6"  (1.676 m)       Assessment & Plan:   Doris Wilkerson is a 72 y.o. female Essential hypertension - Plan: benazepril (LOTENSIN) 10 MG tablet, Comprehensive metabolic panel  -Stable, controlled with current regimen, labs pending, no changes in Lotensin for now.   Mixed  hyperlipidemia - Plan: atorvastatin (LIPITOR) 80 MG tablet Hyperlipemia, mixed - Plan: atorvastatin (LIPITOR) 80 MG tablet, benazepril (LOTENSIN) 10 MG tablet  -Tolerating Lipitor at current high dose, recent LDL reviewed, okay to continue same regimen for now, but can continue to discuss with her cardiologist if other options needed.  Hypothyroidism, unspecified type - Plan: TSH, levothyroxine (SYNTHROID, LEVOTHROID) 25 MCG tablet  -Check TSH, continue same dose of Synthroid for now.  Asymptomatic.   Meds ordered this encounter  Medications  . atorvastatin (LIPITOR) 80 MG tablet    Sig: Take 1 tablet (80 mg total) by mouth daily.    Dispense:  90 tablet    Refill:  1  . benazepril (LOTENSIN) 10 MG tablet    Sig: Take 1 tablet (10 mg total) by mouth daily.    Dispense:  90 tablet    Refill:  1  . levothyroxine (SYNTHROID, LEVOTHROID) 25 MCG tablet    Sig: Take 1 tablet (25 mcg total) by mouth daily before breakfast. Take 1 tablet by mouth  daily before breakfast    Dispense:  90 tablet    Refill:  1   Patient Instructions   I would recommend continuing same dose of statin for now. No other changes in meds. Thanks for coming in today.      IF you received an x-ray today, you will receive an invoice from [facility]. Please contact [facility] at [phone] with questions or concerns regarding your invoice.   IF you received labwork today, you will receive an invoice  from [place]. Please contact [facility] at [phone] with questions or concerns regarding your invoice.   Our billing staff will not be able to assist you with questions regarding bills from these companies.  You will be contacted with the lab results as soon as they are available. The fastest way to get your results is to activate your My Chart account. Instructions are located on the last page of this paperwork. If you have not heard from [place] regarding the results in 2 weeks, please contact this office.       I personally performed the services described in this documentation, which was scribed in my presence. The recorded information has been reviewed and considered for accuracy and completeness, addended by me as needed, and agree with information above.  Signed,   [name], MD Primary Care at [place].  03/17/18 2:37 PM

## 2018-03-16 LAB — COMPREHENSIVE METABOLIC PANEL
A/G RATIO: 2.4 — AB (ref 1.2–2.2)
ALT: 20 IU/L (ref 0–32)
AST: 23 IU/L (ref 0–40)
Albumin: 4.6 g/dL (ref 3.5–4.8)
Alkaline Phosphatase: 81 IU/L (ref 39–117)
BILIRUBIN TOTAL: 0.5 mg/dL (ref 0.0–1.2)
BUN / CREAT RATIO: 21 (ref 12–28)
BUN: 14 mg/dL (ref 8–27)
CALCIUM: 10.2 mg/dL (ref 8.7–10.3)
CHLORIDE: 103 mmol/L (ref 96–106)
CO2: 23 mmol/L (ref 20–29)
Creatinine, Ser: 0.66 mg/dL (ref 0.57–1.00)
GFR, EST AFRICAN AMERICAN: 103 mL/min/{1.73_m2} (ref >59)
GFR, EST NON AFRICAN AMERICAN: 89 mL/min/{1.73_m2} (ref >59)
GLOBULIN, TOTAL: 1.9 g/dL (ref 1.5–4.5)
Glucose: 91 mg/dL (ref 65–99)
POTASSIUM: 4.9 mmol/L (ref 3.5–5.2)
SODIUM: 140 mmol/L (ref 134–144)
TOTAL PROTEIN: 6.5 g/dL (ref 6.0–8.5)

## 2018-03-16 LAB — TSH: TSH: 1.99 u[IU]/mL (ref 0.450–4.500)

## 2018-03-21 ENCOUNTER — Encounter: Payer: Self-pay | Admitting: Physician Assistant

## 2018-03-24 DIAGNOSIS — Z9889 Other specified postprocedural states: Secondary | ICD-10-CM | POA: Diagnosis not present

## 2018-03-24 DIAGNOSIS — H26491 Other secondary cataract, right eye: Secondary | ICD-10-CM | POA: Diagnosis not present

## 2018-03-24 DIAGNOSIS — Z961 Presence of intraocular lens: Secondary | ICD-10-CM | POA: Diagnosis not present

## 2018-04-09 ENCOUNTER — Encounter: Payer: Self-pay | Admitting: Family Medicine

## 2018-04-09 ENCOUNTER — Ambulatory Visit (INDEPENDENT_AMBULATORY_CARE_PROVIDER_SITE_OTHER): Payer: Medicare Other | Admitting: Family Medicine

## 2018-04-09 VITALS — BP 118/66 | HR 75 | Temp 98.3°F | Ht 67.0 in | Wt 152.0 lb

## 2018-04-09 DIAGNOSIS — R350 Frequency of micturition: Secondary | ICD-10-CM

## 2018-04-09 DIAGNOSIS — R300 Dysuria: Secondary | ICD-10-CM

## 2018-04-09 LAB — POCT URINALYSIS DIP (MANUAL ENTRY)
BILIRUBIN UA: NEGATIVE
GLUCOSE UA: NEGATIVE mg/dL
Ketones, POC UA: NEGATIVE mg/dL
Leukocytes, UA: NEGATIVE
Nitrite, UA: NEGATIVE
Protein Ur, POC: 30 mg/dL — AB
Spec Grav, UA: 1.015 (ref 1.010–1.025)
UROBILINOGEN UA: 0.2 U/dL
pH, UA: 7 (ref 5.0–8.0)

## 2018-04-09 LAB — POC MICROSCOPIC URINALYSIS (UMFC): MUCUS RE: ABSENT

## 2018-04-09 NOTE — Patient Instructions (Addendum)
Current symptoms do appear to be from urinary tract infection, but urine testing was overall normal.  I will check a urine culture, but for now drink plenty of fluids - water is best.  Return to the clinic or go to the nearest emergency room if any of your symptoms worsen or new symptoms occur. Thanks for coming in today.    Urinary Frequency, Adult Urinary frequency means urinating more often than usual. People with urinary frequency urinate at least 8 times in 24 hours, even if they drink a normal amount of fluid. Although they urinate more often than normal, the total amount of urine produced in a day may be normal. Urinary frequency is also called pollakiuria. What are the causes? This condition may be caused by:  A urinary tract infection.  Obesity.  Bladder problems, such as bladder stones.  Caffeine or alcohol.  Eating food or drinking fluids that irritate the bladder. These include coffee, tea, soda, artificial sweeteners, citrus, tomato-based foods, and chocolate.  Certain medicines, such as medicines that help the body get rid of extra fluid (diuretics).  Muscle or nerve weakness.  Overactive bladder.  Chronic diabetes.  Interstitial cystitis.  In men, problems with the prostate, such as an enlarged prostate.  In women, pregnancy.  In some cases, the cause may not be known. What increases the risk? This condition is more likely to develop in:  Women who have gone through menopause.  Men with prostate problems.  People with a disease or injury that affects the nerves or spinal cord.  People who have or have had a condition that affects the brain, such as a stroke.  What are the signs or symptoms? Symptoms of this condition include:  Feeling an urgent need to urinate often. The stress and anxiety of needing to find a bathroom quickly can make this urge worse.  Urinating 8 or more times in 24 hours.  Urinating as often as every 1 to 2 hours.  How is this  diagnosed? This condition is diagnosed based on your symptoms, your medical history, and a physical exam. You may have tests, such as:  Blood tests.  Urine tests.  Imaging tests, such as X-rays or ultrasounds.  A bladder test.  A test of your neurological system. This is the body system that senses the need to urinate.  A test to check for problems in the urethra and bladder called cystoscopy.  You may also be asked to keep a bladder diary. A bladder diary is a record of what you eat and drink, how often you urinate, and how much you urinate. You may need to see a health care provider who specializes in conditions of the urinary tract (urologist) or kidneys (nephrologist). How is this treated? Treatment for this condition depends on the cause. Sometimes the condition goes away on its own and treatment is not necessary. If treatment is needed, it may include:  Taking medicine.  Learning exercises that strengthen the muscles that help control urination.  Following a bladder training program. This may include: ? Learning to delay going to the bathroom. ? Double urinating (voiding). This helps if you are not completely emptying your bladder. ? Scheduled voiding.  Making diet changes, such as: ? Avoiding caffeine. ? Drinking fewer fluids, especially alcohol. ? Not drinking in the evening. ? Not having foods or drinks that may irritate the bladder. ? Eating foods that help prevent or ease constipation. Constipation can make this condition worse.  Having the nerves in your  bladder stimulated. There are two options for stimulating the nerves to your bladder: ? Outpatient electrical nerve stimulation. This is done by your health care provider. ? Surgery to implant a bladder pacemaker. The pacemaker helps to control the urge to urinate.  Follow these instructions at home:  Keep a bladder diary if told to by your health care provider.  Take over-the-counter and prescription medicines  only as told by your health care provider.  Do any exercises as told by your health care provider.  Follow a bladder training program as told by your health care provider.  Make any recommended diet changes.  Keep all follow-up visits as told by your health care provider. This is important. Contact a health care provider if:  You start urinating more often.  You feel pain or irritation when you urinate.  You notice blood in your urine.  Your urine looks cloudy.  You develop a fever.  You begin vomiting. Get help right away if:  You are unable to urinate. This information is not intended to replace advice given to you by your health care provider. Make sure you discuss any questions you have with your health care provider. Document Released: 09/06/2009 Document Revised: 12/12/2015 Document Reviewed: 06/06/2015 Elsevier Interactive Patient Education  2018 [employer].    IF you received an x-ray today, you will receive an invoice from [facility]. Please contact [facility] at [phone] with questions or concerns regarding your invoice.   IF you received labwork today, you will receive an invoice from [place]. Please contact [facility] at [phone] with questions or concerns regarding your invoice.   Our billing staff will not be able to assist you with questions regarding bills from these companies.  You will be contacted with the lab results as soon as they are available. The fastest way to get your results is to activate your My Chart account. Instructions are located on the last page of this paperwork. If you have not heard from [place] regarding the results in 2 weeks, please contact this office.

## 2018-04-09 NOTE — Progress Notes (Signed)
Subjective:  By signing my name below, I, Moises Blood, attest that this documentation has been prepared under the direction and in the presence of [name], MD. Electronically Signed: Moises Blood, [place]. 04/09/2018 , 12:22 PM .  Patient was seen in Room 11 .   Patient ID: Doris Wilkerson, female    DOB: May 31, 1946, 72 y.o.   MRN: [id number] Chief Complaint  Patient presents with  . possible UTI    2 weeks ago, burning, and dont feel like she empty    HPI Doris Wilkerson is a 72 y.o. female  Patient states she initially noticed discolored urine about 2 weeks ago after gardening. She didn't have urinary frequency or dysuria at that time. She also mentions she hasn't been sleeping well. Her urinary symptoms gradually worsened with increased urinary frequency, increased nocturia and lower abdominal pain.   She was last seen for UTI on 06/15/17, culture positive for E. Coli, and treated with Cipro at that time without difficulties.   Weight loss Wt Readings from Last 3 Encounters:  04/09/18 152 lb (68.9 kg)  03/15/18 154 lb 9.6 oz (70.1 kg)  11/10/17 160 lb (72.6 kg)   She's been walking a lot for exercise.   Patient Active Problem List   Diagnosis Date Noted  . Pain in joint, ankle and foot 02/14/2013  . Foot fracture 02/14/2013  . CAD (coronary artery disease) 12/09/2011  . Dyslipidemia 12/09/2011  . Osteopenia 12/09/2011  . Hypothyroid 12/09/2011  . Asthma 12/09/2011   Past Medical History:  Diagnosis Date  . Asthma   . Cataract   . GERD (gastroesophageal reflux disease)   . Hyperlipidemia   . Hypertension   . Thyroid disease    Past Surgical History:  Procedure Laterality Date  . Cataract Surgery    . CORONARY ANGIOPLASTY WITH STENT PLACEMENT    . EYE SURGERY    . ORTHOPEDIC SURGERY  2012  . TONSILLECTOMY  1951   Allergies  Allergen Reactions  . Plavix [Clopidogrel Bisulfate]    Prior to Admission medications   Medication Sig Start Date End Date Taking?  Authorizing Provider  aspirin 81 MG tablet Take 81 mg by mouth daily.     Hayden Rasmussen, MD  atorvastatin (LIPITOR) 80 MG tablet Take 1 tablet (80 mg total) by mouth daily. 03/15/18   [name], MD  benazepril (LOTENSIN) 10 MG tablet Take 1 tablet (10 mg total) by mouth daily. 03/15/18   [name], MD  levothyroxine (SYNTHROID, LEVOTHROID) 25 MCG tablet Take 1 tablet (25 mcg total) by mouth daily before breakfast. Take 1 tablet by mouth  daily before breakfast 03/15/18   [name], MD  OVER THE COUNTER MEDICATION Vitamin D 2000iu daily.    [provider]   Social History   Socioeconomic History  . Marital status: Married    Spouse name: Not on file  . Number of children: Not on file  . Years of education: Not on file  . Highest education level: Not on file  Occupational History  . Occupation: unemployed  Social Needs  . Financial resource strain: Not on file  . Food insecurity:    Worry: Not on file    Inability: Not on file  . Transportation needs:    Medical: Not on file    Non-medical: Not on file  Tobacco Use  . Smoking status: Never Smoker  . Smokeless tobacco: Never Used  Substance and Sexual Activity  . Alcohol  use: Yes    Alcohol/week: 1.2 oz    Types: 2 Standard drinks or equivalent per week    Comment: occassionally  . Drug use: No  . Sexual activity: Not on file  Lifestyle  . Physical activity:    Days per week: Not on file    Minutes per session: Not on file  . Stress: Not on file  Relationships  . Social connections:    Talks on phone: Not on file    Gets together: Not on file    Attends religious service: Not on file    Active member of club or organization: Not on file    Attends meetings of clubs or organizations: Not on file    Relationship status: Not on file  . Intimate partner violence:    Fear of current or ex partner: Not on file    Emotionally abused: Not on file    Physically abused: Not on file    Forced  sexual activity: Not on file  Other Topics Concern  . Not on file  Social History Narrative   Married. Education: college. Pt does exercise-   Review of Systems  Constitutional: Negative for chills, fatigue, fever and unexpected weight change.  Respiratory: Negative for cough.   Gastrointestinal: Negative for constipation, diarrhea, nausea and vomiting.  Genitourinary: Positive for dysuria and frequency. Negative for hematuria.  Skin: Negative for rash and wound.  Neurological: Negative for dizziness, weakness and headaches.       Objective:   Physical Exam  Constitutional: She is oriented to person, place, and time. She appears well-developed and well-nourished. No distress.  HENT:  Head: Normocephalic and atraumatic.  Eyes: Pupils are equal, round, and reactive to light. EOM are normal.  Neck: Neck supple.  Cardiovascular: Normal rate.  Pulmonary/Chest: Effort normal. No respiratory distress.  Abdominal: Soft. Bowel sounds are normal. She exhibits no distension. There is tenderness (minimal, right sided) in the suprapubic area. There is no rebound and no guarding.  Musculoskeletal: Normal range of motion.  Neurological: She is alert and oriented to person, place, and time.  Skin: Skin is warm and dry.  Psychiatric: She has a normal mood and affect. Her behavior is normal.  Nursing note and vitals reviewed.   Vitals:   04/09/18 1141  BP: 118/66  Pulse: 75  Temp: 98.3 F (36.8 C)  TempSrc: Oral  SpO2: 98%  Weight: 152 lb (68.9 kg)  Height: 5\' 7"  (1.702 m)   Results for orders placed or performed in visit on 04/09/18  POCT urinalysis dipstick  Result Value Ref Range   Color, UA yellow yellow   Clarity, UA clear clear   Glucose, UA negative negative mg/dL   Bilirubin, UA negative negative   Ketones, POC UA negative negative mg/dL   Spec Grav, UA 1.015 1.010 - 1.025   Blood, UA small (A) negative   pH, UA 7.0 5.0 - 8.0   Protein Ur, POC =30 (A) negative mg/dL    Urobilinogen, UA 0.2 0.2 or 1.0 E.U./dL   Nitrite, UA Negative Negative   Leukocytes, UA Negative Negative  POCT Microscopic Urinalysis ([facility])  Result Value Ref Range   WBC,UR,HPF,POC None None WBC/hpf   RBC,UR,HPF,POC None None RBC/hpf   Bacteria None None, Too numerous to count   Mucus Absent Absent   Epithelial Cells, UR Per Microscopy Few (A) None, Too numerous to count cells/hpf       Assessment & Plan:  Doris Wilkerson is a 72 y.o. female  Dysuria - Plan: POCT urinalysis dipstick, POCT Microscopic Urinalysis ([facility]), Urine Culture  Urinary frequency - Plan: POCT urinalysis dipstick, POCT Microscopic Urinalysis ([facility]), Urine Culture Initial symptoms are not typical of UTI, but reassuring urinalysis.  Check urine culture, option of waiting for urine culture versus initial trial of antibiotic discussed, plans to wait on antibiotics at this time.  Fluid intake discussed, handout given urinary frequency and RTC precautions if persistent or worsening.  No orders of the defined types were placed in this encounter.  Patient Instructions    Current symptoms do appear to be from urinary tract infection, but urine testing was overall normal.  I will check a urine culture, but for now drink plenty of fluids - water is best.  Return to the clinic or go to the nearest emergency room if any of your symptoms worsen or new symptoms occur. Thanks for coming in today.    Urinary Frequency, Adult Urinary frequency means urinating more often than usual. People with urinary frequency urinate at least 8 times in 24 hours, even if they drink a normal amount of fluid. Although they urinate more often than normal, the total amount of urine produced in a day may be normal. Urinary frequency is also called pollakiuria. What are the causes? This condition may be caused by:  A urinary tract infection.  Obesity.  Bladder problems, such as bladder stones.  Caffeine or alcohol.  Eating food or drinking  fluids that irritate the bladder. These include coffee, tea, soda, artificial sweeteners, citrus, tomato-based foods, and chocolate.  Certain medicines, such as medicines that help the body get rid of extra fluid (diuretics).  Muscle or nerve weakness.  Overactive bladder.  Chronic diabetes.  Interstitial cystitis.  In men, problems with the prostate, such as an enlarged prostate.  In women, pregnancy.  In some cases, the cause may not be known. What increases the risk? This condition is more likely to develop in:  Women who have gone through menopause.  Men with prostate problems.  People with a disease or injury that affects the nerves or spinal cord.  People who have or have had a condition that affects the brain, such as a stroke.  What are the signs or symptoms? Symptoms of this condition include:  Feeling an urgent need to urinate often. The stress and anxiety of needing to find a bathroom quickly can make this urge worse.  Urinating 8 or more times in 24 hours.  Urinating as often as every 1 to 2 hours.  How is this diagnosed? This condition is diagnosed based on your symptoms, your medical history, and a physical exam. You may have tests, such as:  Blood tests.  Urine tests.  Imaging tests, such as X-rays or ultrasounds.  A bladder test.  A test of your neurological system. This is the body system that senses the need to urinate.  A test to check for problems in the urethra and bladder called cystoscopy.  You may also be asked to keep a bladder diary. A bladder diary is a record of what you eat and drink, how often you urinate, and how much you urinate. You may need to see a health care provider who specializes in conditions of the urinary tract (urologist) or kidneys (nephrologist). How is this treated? Treatment for this condition depends on the cause. Sometimes the condition goes away on its own and treatment is not necessary. If treatment is needed,  it may include:  Taking medicine.  Learning exercises that  strengthen the muscles that help control urination.  Following a bladder training program. This may include: ? Learning to delay going to the bathroom. ? Double urinating (voiding). This helps if you are not completely emptying your bladder. ? Scheduled voiding.  Making diet changes, such as: ? Avoiding caffeine. ? Drinking fewer fluids, especially alcohol. ? Not drinking in the evening. ? Not having foods or drinks that may irritate the bladder. ? Eating foods that help prevent or ease constipation. Constipation can make this condition worse.  Having the nerves in your bladder stimulated. There are two options for stimulating the nerves to your bladder: ? Outpatient electrical nerve stimulation. This is done by your health care provider. ? Surgery to implant a bladder pacemaker. The pacemaker helps to control the urge to urinate.  Follow these instructions at home:  Keep a bladder diary if told to by your health care provider.  Take over-the-counter and prescription medicines only as told by your health care provider.  Do any exercises as told by your health care provider.  Follow a bladder training program as told by your health care provider.  Make any recommended diet changes.  Keep all follow-up visits as told by your health care provider. This is important. Contact a health care provider if:  You start urinating more often.  You feel pain or irritation when you urinate.  You notice blood in your urine.  Your urine looks cloudy.  You develop a fever.  You begin vomiting. Get help right away if:  You are unable to urinate. This information is not intended to replace advice given to you by your health care provider. Make sure you discuss any questions you have with your health care provider. Document Released: 09/06/2009 Document Revised: 12/12/2015 Document Reviewed: 06/06/2015 Elsevier Interactive  Patient Education  2018 [employer].    IF you received an x-ray today, you will receive an invoice from [facility]. Please contact [facility] at [phone] with questions or concerns regarding your invoice.   IF you received labwork today, you will receive an invoice from [facility]. Please contact [facility] at [phone] with questions or concerns regarding your invoice.   Our billing staff will not be able to assist you with questions regarding bills from these companies.  You will be contacted with the lab results as soon as they are available. The fastest way to get your results is to activate your My Chart account. Instructions are located on the last page of this paperwork. If you have not heard from [place] regarding the results in 2 weeks, please contact this office.       I personally performed the services described in this documentation, which was scribed in my presence. The recorded information has been reviewed and considered for accuracy and completeness, addended by me as needed, and agree with information above.  Signed,   [name], MD [facility].  04/09/18 1:51 PM

## 2018-04-10 LAB — URINE CULTURE: Organism ID, Bacteria: NO GROWTH

## 2018-06-28 ENCOUNTER — Encounter: Payer: Self-pay | Admitting: Emergency Medicine

## 2018-06-28 ENCOUNTER — Other Ambulatory Visit: Payer: Self-pay

## 2018-06-28 ENCOUNTER — Ambulatory Visit (INDEPENDENT_AMBULATORY_CARE_PROVIDER_SITE_OTHER): Payer: Medicare Other | Admitting: Emergency Medicine

## 2018-06-28 VITALS — BP 134/66 | HR 66 | Temp 98.3°F | Resp 16 | Ht 66.0 in | Wt 152.2 lb

## 2018-06-28 DIAGNOSIS — H00015 Hordeolum externum left lower eyelid: Secondary | ICD-10-CM

## 2018-06-28 MED ORDER — ERYTHROMYCIN 5 MG/GM OP OINT: 1.0000 "application " | TOPICAL_OINTMENT | Freq: Two times a day (BID) | OPHTHALMIC | 0 refills | Status: AC

## 2018-06-28 NOTE — Patient Instructions (Addendum)
IF you received an x-ray today, you will receive an invoice from [facility]. Please contact [facility] at [phone] with questions or concerns regarding your invoice.   IF you received labwork today, you will receive an invoice from [facility]. Please contact [facility] at [phone] with questions or concerns regarding your invoice.   Our billing staff will not be able to assist you with questions regarding bills from these companies.  You will be contacted with the lab results as soon as they are available. The fastest way to get your results is to activate your My Chart account. Instructions are located on the last page of this paperwork. If you have not heard from [place] regarding the results in 2 weeks, please contact this office.    Stye A stye is a bump on your eyelid caused by a bacterial infection. A stye can form inside the eyelid (internal stye) or outside the eyelid (external stye). An internal stye may be caused by an infected oil-producing gland inside your eyelid. An external stye may be caused by an infection at the base of your eyelash (hair follicle). Styes are very common. Anyone can get them at any age. They usually occur in just one eye, but you may have more than one in either eye. What are the causes? The infection is almost always caused by bacteria called Staphylococcus aureus. This is a common type of bacteria that lives on your skin. What increases the risk? You may be at higher risk for a stye if you have had one before. You may also be at higher risk if you have:  Diabetes.  Long-term illness.  Long-term eye redness.  A skin condition called seborrhea.  High fat levels in your blood (lipids).  What are the signs or symptoms? Eyelid pain is the most common symptom of a stye. Internal styes are more painful than external styes. Other signs and symptoms may include:  Painful swelling of your eyelid.  A scratchy feeling in your  eye.  Tearing and redness of your eye.  Pus draining from the stye.  How is this diagnosed? Your health care provider may be able to diagnose a stye just by examining your eye. The health care provider may also check to make sure:  You do not have a fever or other signs of a more serious infection.  The infection has not spread to other parts of your eye or areas around your eye.  How is this treated? Most styes will clear up in a few days without treatment. In some cases, you may need to use antibiotic drops or ointment to prevent infection. Your health care provider may have to drain the stye surgically if your stye is:  Large.  Causing a lot of pain.  Interfering with your vision.  This can be done using a thin blade or a needle. Follow these instructions at home:  Take medicines only as directed by your health care provider.  Apply a clean, warm compress to your eye for 10 minutes, 4 times a day.  Do not wear contact lenses or eye makeup until your stye has healed.  Do not try to pop or drain the stye. Contact a health care provider if:  You have chills or a fever.  Your stye does not go away after several days.  Your stye affects your vision.  Your eyeball becomes swollen, red, or painful. This information is not intended to replace advice given to you by your health  care provider. Make sure you discuss any questions you have with your health care provider. Document Released: 08/20/2005 Document Revised: 07/06/2016 Document Reviewed: 02/24/2014 Elsevier Interactive Patient Education  [employer].

## 2018-06-28 NOTE — Progress Notes (Signed)
Doris Wilkerson 72 y.o.   Chief Complaint  Patient presents with  . Eye Problem    pimple on LEFT eyelid x 4 days    HISTORY OF PRESENT ILLNESS: This is a 72 y.o. female complaining of   Infected stye on the left lower eyelid for the past 3 to 4 days.  Denies trauma or visual problems.  HPI   Prior to Admission medications   Medication Sig Start Date End Date Taking? Authorizing Provider  aspirin 81 MG tablet Take 81 mg by mouth daily.    Yes Doris Rasmussen, MD  atorvastatin (LIPITOR) 80 MG tablet Take 1 tablet (80 mg total) by mouth daily. 03/15/18  Yes Doris Agreste, MD  benazepril (LOTENSIN) 10 MG tablet Take 1 tablet (10 mg total) by mouth daily. 03/15/18  Yes Doris Agreste, MD  levothyroxine (SYNTHROID, LEVOTHROID) 25 MCG tablet Take 1 tablet (25 mcg total) by mouth daily before breakfast. Take 1 tablet by mouth  daily before breakfast 03/15/18  Yes Doris Agreste, MD  OVER THE COUNTER MEDICATION Vitamin D 2000iu daily.   Yes [provider]  erythromycin ophthalmic ointment Place 1 application into the left eye 2 (two) times daily for 5 days. 06/28/18 07/03/18  Doris Pollen, MD    Allergies  Allergen Reactions  . Plavix [Clopidogrel Bisulfate]     Patient Active Problem List   Diagnosis Date Noted  . CAD (coronary artery disease) 12/09/2011  . Dyslipidemia 12/09/2011  . Osteopenia 12/09/2011  . Hypothyroid 12/09/2011  . Asthma 12/09/2011    Past Medical History:  Diagnosis Date  . Asthma   . Cataract   . GERD (gastroesophageal reflux disease)   . Hyperlipidemia   . Hypertension   . Thyroid disease     Past Surgical History:  Procedure Laterality Date  . Cataract Surgery    . CORONARY ANGIOPLASTY WITH STENT PLACEMENT    . EYE SURGERY    . ORTHOPEDIC SURGERY  2012  . TONSILLECTOMY  1951    Social History   Socioeconomic History  . Marital status: Married    Spouse name: Not on file  . Number of children: Not on file  . Years of  education: Not on file  . Highest education level: Not on file  Occupational History  . Occupation: unemployed  Social Needs  . Financial resource strain: Not on file  . Food insecurity:    Worry: Not on file    Inability: Not on file  . Transportation needs:    Medical: Not on file    Non-medical: Not on file  Tobacco Use  . Smoking status: Never Smoker  . Smokeless tobacco: Never Used  Substance and Sexual Activity  . Alcohol use: Yes    Alcohol/week: 1.2 oz    Types: 2 Standard drinks or equivalent per week    Comment: occassionally  . Drug use: No  . Sexual activity: Not on file  Lifestyle  . Physical activity:    Days per week: Not on file    Minutes per session: Not on file  . Stress: Not on file  Relationships  . Social connections:    Talks on phone: Not on file    Gets together: Not on file    Attends religious service: Not on file    Active member of club or organization: Not on file    Attends meetings of clubs or organizations: Not on file    Relationship status: Not on  file  . Intimate partner violence:    Fear of current or ex partner: Not on file    Emotionally abused: Not on file    Physically abused: Not on file    Forced sexual activity: Not on file  Other Topics Concern  . Not on file  Social History Narrative   Married. Education: college. Pt does exercise-    Family History  Problem Relation Age of Onset  . Cancer Father   . Heart disease Brother   . Dementia Brother   . Breast cancer Neg Hx   . Colon cancer Neg Hx   . Esophageal cancer Neg Hx   . Pancreatic cancer Neg Hx   . Rectal cancer Neg Hx   . Stomach cancer Neg Hx      Review of Systems  Constitutional: Negative.  Negative for chills and fever.  Eyes: Negative.  Negative for blurred vision, double vision, photophobia, pain, discharge and redness.  Gastrointestinal: Negative for nausea and vomiting.  Skin: Negative.  Negative for rash.  Neurological: Negative for dizziness  and headaches.  All other systems reviewed and are negative.  Vitals:   06/28/18 1212  BP: 134/66  Pulse: 66  Resp: 16  Temp: 98.3 F (36.8 C)  SpO2: 99%     Physical Exam  Constitutional: She is oriented to person, place, and time. She appears well-developed and well-nourished.  HENT:  Head: Normocephalic and atraumatic.  Left lower eyelid: Positive open and draining stye.  Mild surrounding swelling and erythema.  Eyes: Pupils are equal, round, and reactive to light. Conjunctivae and EOM are normal.  Neck: Normal range of motion. Neck supple.  Cardiovascular: Normal rate.  Pulmonary/Chest: Effort normal.  Neurological: She is alert and oriented to person, place, and time.  Skin: Skin is warm and dry. Capillary refill takes less than 2 seconds.  Psychiatric: She has a normal mood and affect. Her behavior is normal.  Vitals reviewed.    ASSESSMENT & PLAN: Doris Wilkerson was seen today for eye problem.  Diagnoses and all orders for this visit:  Hordeolum externum of left lower eyelid Comments: infected Orders: -     erythromycin ophthalmic ointment; Place 1 application into the left eye 2 (two) times daily for 5 days.   Patient Instructions       IF you received an x-ray today, you will receive an invoice from [facility]. Please contact [facility] at [phone] with questions or concerns regarding your invoice.   IF you received labwork today, you will receive an invoice from [facility]. Please contact [facility] at [phone] with questions or concerns regarding your invoice.   Our billing staff will not be able to assist you with questions regarding bills from these companies.  You will be contacted with the lab results as soon as they are available. The fastest way to get your results is to activate your My Chart account. Instructions are located on the last page of this paperwork. If you have not heard from [place] regarding the results in 2 weeks, please  contact this office.    Stye A stye is a bump on your eyelid caused by a bacterial infection. A stye can form inside the eyelid (internal stye) or outside the eyelid (external stye). An internal stye may be caused by an infected oil-producing gland inside your eyelid. An external stye may be caused by an infection at the base of your eyelash (hair follicle). Styes are very common. Anyone can get them at any age.  They usually occur in just one eye, but you may have more than one in either eye. What are the causes? The infection is almost always caused by bacteria called Staphylococcus aureus. This is a common type of bacteria that lives on your skin. What increases the risk? You may be at higher risk for a stye if you have had one before. You may also be at higher risk if you have:  Diabetes.  Long-term illness.  Long-term eye redness.  A skin condition called seborrhea.  High fat levels in your blood (lipids).  What are the signs or symptoms? Eyelid pain is the most common symptom of a stye. Internal styes are more painful than external styes. Other signs and symptoms may include:  Painful swelling of your eyelid.  A scratchy feeling in your eye.  Tearing and redness of your eye.  Pus draining from the stye.  How is this diagnosed? Your health care provider may be able to diagnose a stye just by examining your eye. The health care provider may also check to make sure:  You do not have a fever or other signs of a more serious infection.  The infection has not spread to other parts of your eye or areas around your eye.  How is this treated? Most styes will clear up in a few days without treatment. In some cases, you may need to use antibiotic drops or ointment to prevent infection. Your health care provider may have to drain the stye surgically if your stye is:  Large.  Causing a lot of pain.  Interfering with your vision.  This can be done using a thin blade or a  needle. Follow these instructions at home:  Take medicines only as directed by your health care provider.  Apply a clean, warm compress to your eye for 10 minutes, 4 times a day.  Do not wear contact lenses or eye makeup until your stye has healed.  Do not try to pop or drain the stye. Contact a health care provider if:  You have chills or a fever.  Your stye does not go away after several days.  Your stye affects your vision.  Your eyeball becomes swollen, red, or painful. This information is not intended to replace advice given to you by your health care provider. Make sure you discuss any questions you have with your health care provider. Document Released: 08/20/2005 Document Revised: 07/06/2016 Document Reviewed: 02/24/2014 Elsevier Interactive Patient Education  2018 Elsevier Inc.      Agustina Caroli, MD [facility]

## 2018-09-02 ENCOUNTER — Other Ambulatory Visit: Payer: Self-pay | Admitting: Family Medicine

## 2018-09-02 DIAGNOSIS — E782 Mixed hyperlipidemia: Secondary | ICD-10-CM

## 2018-09-02 DIAGNOSIS — I10 Essential (primary) hypertension: Secondary | ICD-10-CM

## 2018-09-09 ENCOUNTER — Other Ambulatory Visit: Payer: Self-pay | Admitting: Family Medicine

## 2018-09-09 DIAGNOSIS — E782 Mixed hyperlipidemia: Secondary | ICD-10-CM

## 2018-09-09 NOTE — Telephone Encounter (Signed)
Requested Prescriptions  Pending Prescriptions Disp Refills  . atorvastatin (LIPITOR) 80 MG tablet [Pharmacy Med Name: ATORVASTATIN 80MG  TABLETS] 90 tablet 0    Sig: TAKE 1 TABLET(80 MG) BY MOUTH DAILY     Cardiovascular:  Antilipid - Statins Failed - 09/09/2018 12:23 PM      Failed - Total Cholesterol in normal range and within 360 days    Cholesterol, Total  Date Value Ref Range Status  09/04/2017 168 100 - 199 mg/dL Final         Failed - LDL in normal range and within 360 days    LDL Calculated  Date Value Ref Range Status  09/04/2017 87 0 - 99 mg/dL Final         Failed - HDL in normal range and within 360 days    HDL  Date Value Ref Range Status  09/04/2017 68 >39 mg/dL Final    Comment:    **Effective September 14, 2017, HDL Cholesterol**   reference interval will be changing to:                                   Female        Female                               36 - [phone]   51 - [id number]          Failed - Triglycerides in normal range and within 360 days    Triglycerides  Date Value Ref Range Status  09/04/2017 64 0 - 149 mg/dL Final         Passed - Patient is not pregnant      Passed - Valid encounter within last 12 months    Recent Outpatient Visits          2 months ago Hordeolum externum of left lower eyelid   [facility] at [facility], Ines Bloomer, MD   5 months ago Dysuria   Primary Care at [place], MD   5 months ago Essential hypertension   [facility] at [facility], Ranell Patrick, MD   11 months ago Essential hypertension   Primary Care at [place], [place], [place]   1 year ago Dysuria   Primary Care at [place], [place], [place]

## 2018-09-09 NOTE — Telephone Encounter (Signed)
Copied from [place] [id number]. Topic: Quick Communication - Rx Refill/Question >> Sep 09, 2018 12:26 PM Leward Quan A wrote: Medication: atorvastatin (LIPITOR) 80 MG tablet   Has the patient contacted their pharmacy? Yes.     Preferred Pharmacy (with phone number or street name): [facility], [place] [phone] (Phone) [phone] (Fax     Agent: Please be advised that RX refills may take up to 3 business days. We ask that you follow-up with your pharmacy.

## 2018-09-13 ENCOUNTER — Other Ambulatory Visit: Payer: Self-pay | Admitting: Family Medicine

## 2018-09-13 DIAGNOSIS — E039 Hypothyroidism, unspecified: Secondary | ICD-10-CM

## 2018-09-14 NOTE — Telephone Encounter (Signed)
Requested Prescriptions  Pending Prescriptions Disp Refills  . levothyroxine (SYNTHROID, LEVOTHROID) 25 MCG tablet [Pharmacy Med Name: LEVOTHYROXINE 0.025MG  (25MCG) TAB] 90 tablet 3    Sig: TAKE 1 TABLET(25 MCG) BY MOUTH DAILY BEFORE BREAKFAST     Endocrinology:  Hypothyroid Agents Failed - 09/13/2018  3:14 PM      Failed - TSH needs to be rechecked within 3 months after an abnormal result. Refill until TSH is due.      Passed - TSH in normal range and within 360 days    TSH  Date Value Ref Range Status  03/15/2018 1.990 0.450 - 4.500 uIU/mL Final         Passed - Valid encounter within last 12 months    Recent Outpatient Visits          2 months ago Hordeolum externum of left lower eyelid   [facility] at [facility], Ines Bloomer, MD   5 months ago Dysuria   Primary Care at [place], MD   6 months ago Essential hypertension   [facility] at [facility], Ranell Patrick, MD   12 months ago Essential hypertension   Primary Care at [place], [place], [place]   1 year ago Dysuria   Primary Care at [place], [place], [place]

## 2018-10-08 ENCOUNTER — Encounter: Payer: Self-pay | Admitting: Family Medicine

## 2018-10-08 ENCOUNTER — Other Ambulatory Visit: Payer: Self-pay

## 2018-10-08 ENCOUNTER — Ambulatory Visit (INDEPENDENT_AMBULATORY_CARE_PROVIDER_SITE_OTHER): Payer: Medicare Other | Admitting: Family Medicine

## 2018-10-08 VITALS — BP 122/78 | HR 67 | Temp 98.4°F | Resp 14 | Ht 66.0 in | Wt 152.2 lb

## 2018-10-08 DIAGNOSIS — N952 Postmenopausal atrophic vaginitis: Secondary | ICD-10-CM

## 2018-10-08 DIAGNOSIS — M7521 Bicipital tendinitis, right shoulder: Secondary | ICD-10-CM

## 2018-10-08 DIAGNOSIS — Z23 Encounter for immunization: Secondary | ICD-10-CM | POA: Diagnosis not present

## 2018-10-08 DIAGNOSIS — E785 Hyperlipidemia, unspecified: Secondary | ICD-10-CM

## 2018-10-08 DIAGNOSIS — R5383 Other fatigue: Secondary | ICD-10-CM | POA: Diagnosis not present

## 2018-10-08 DIAGNOSIS — E039 Hypothyroidism, unspecified: Secondary | ICD-10-CM | POA: Diagnosis not present

## 2018-10-08 MED ORDER — ESTROGENS, CONJUGATED 0.625 MG/GM VA CREA: 0.5000 | TOPICAL_CREAM | VAGINAL | 0 refills | Status: DC

## 2018-10-08 NOTE — Progress Notes (Signed)
11/15/201911:37 AM  Doris Wilkerson June 14, 1946, 72 y.o. female [id number]  Chief Complaint  Patient presents with  . Shoulder Pain    chronic right shoulder pain  . Vaginal Itching     dryness    HPI:   Patient is a 72 y.o. 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 0 No No No No     Depression screen [facility] 2/9 10/08/2018 06/28/2018 04/09/2018  Decreased Interest 0 0 0  Down, Depressed, Hopeless 0 0 0  PHQ - 2 Score 0 0 0    Allergies  Allergen Reactions  . Plavix [Clopidogrel Bisulfate]     Prior to Admission medications   Medication Sig Start Date End Date Taking? Authorizing Provider  aspirin 81 MG tablet Take 81 mg by mouth daily.    Yes Hayden Rasmussen, MD  atorvastatin (LIPITOR) 80 MG tablet TAKE 1 TABLET(80 MG) BY MOUTH DAILY 09/09/18  Yes [name], MD  benazepril (LOTENSIN) 10 MG tablet TAKE 1 TABLET(10 MG) BY MOUTH DAILY 09/02/18  Yes [name], MD  levothyroxine (SYNTHROID, LEVOTHROID) 25 MCG tablet TAKE 1 TABLET(25 MCG) BY MOUTH DAILY BEFORE BREAKFAST 09/14/18  Yes [name], MD  OVER THE COUNTER MEDICATION Vitamin D 2000iu daily.   Yes [provider]    Past Medical History:  Diagnosis Date  . Asthma   . Cataract    . GERD (gastroesophageal reflux disease)   . Hyperlipidemia   . Hypertension   . Thyroid disease     Past Surgical History:  Procedure Laterality Date  . Cataract Surgery    . CORONARY ANGIOPLASTY WITH STENT PLACEMENT    . EYE SURGERY    . ORTHOPEDIC SURGERY  2012  . TONSILLECTOMY  1951    Social History   Tobacco Use  . Smoking status: Never Smoker  . Smokeless tobacco: Never Used  Substance Use Topics  . Alcohol use: Yes    Alcohol/week: 2.0 standard drinks    Types: 2 Standard drinks or equivalent per week    Comment: occassionally    Family History  Problem Relation Age of Onset  . Cancer Father   . Heart disease Brother   . Dementia Brother   . Breast cancer Neg Hx   . Colon cancer Neg Hx   . Esophageal cancer Neg Hx   . Pancreatic cancer Neg Hx   . Rectal cancer Neg Hx   . Stomach cancer Neg Hx     ROS Per hpi  OBJECTIVE:  Blood pressure 122/78, pulse 67, temperature 98.4 F (36.9 C), temperature source Oral, resp. rate 14, height 5\' 6"  (1.676 m), weight 152 lb 3.2 oz (69 kg), SpO2 98 %. Body mass index is 24.57 kg/m.   Physical Exam  Constitutional: She is oriented to  person, place, and time. She appears well-developed and well-nourished.  HENT:  Head: Normocephalic and atraumatic.  Mouth/Throat: Mucous membranes are normal.  Eyes: Pupils are equal, round, and reactive to light. Conjunctivae and EOM are normal. No scleral icterus.  Neck: Neck supple.  Pulmonary/Chest: Effort normal.  Genitourinary: There is no rash or lesion on the right labia. There is no rash or lesion on the left labia.  Genitourinary Comments: perineum with any skin changes or lesions Overall thin atrophic vaginal mucosa  Musculoskeletal:       Right shoulder: She exhibits tenderness (at bicipetal groove) and pain (with hawkins ). She exhibits normal range of motion, no bony tenderness, no spasm and normal strength.  Neurological: She is alert and oriented to person, place,  and time.  Skin: Skin is warm and dry.  Psychiatric: She has a normal mood and affect.  Nursing note and vitals reviewed.   ASSESSMENT and PLAN  1. Vaginal atrophy - conjugated estrogens (PREMARIN) vaginal cream; Place 0.5 Applicatorfuls vaginally 2 (two) times a week.  2. Biceps tendinitis of right upper extremity - Ambulatory referral to Physical Therapy  Other orders - Flu Vaccine QUAD 6+ mos PF IM (Fluarix Quad PF)   Return if symptoms worsen or fail to improve.    Rutherford Guys, MD Primary Care at [place],  [address] Ph.  [phone] Fax [phone]

## 2018-10-08 NOTE — Patient Instructions (Signed)
° ° ° °  If you have lab work done today you will be contacted with your lab results within the next 2 weeks.  If you have not heard from us then please contact us. The fastest way to get your results is to register for My Chart. ° ° °IF you received an x-ray today, you will receive an invoice from [facility]. Please contact [facility] at [phone] with questions or concerns regarding your invoice.  ° °IF you received labwork today, you will receive an invoice from [facility]. Please contact [facility] at [phone] with questions or concerns regarding your invoice.  ° °Our billing staff will not be able to assist you with questions regarding bills from these companies. ° °You will be contacted with the lab results as soon as they are available. The fastest way to get your results is to activate your My Chart account. Instructions are located on the last page of this paperwork. If you have not heard from us regarding the results in 2 weeks, please contact this office. °  ° ° ° °

## 2018-10-12 ENCOUNTER — Encounter: Payer: Self-pay | Admitting: Family Medicine

## 2018-10-19 ENCOUNTER — Ambulatory Visit: Payer: Medicare Other | Attending: Family Medicine | Admitting: Physical Therapy

## 2018-10-19 ENCOUNTER — Other Ambulatory Visit: Payer: Self-pay

## 2018-10-19 ENCOUNTER — Encounter: Payer: Self-pay | Admitting: Physical Therapy

## 2018-10-19 DIAGNOSIS — M25511 Pain in right shoulder: Secondary | ICD-10-CM | POA: Diagnosis not present

## 2018-10-19 DIAGNOSIS — M6281 Muscle weakness (generalized): Secondary | ICD-10-CM | POA: Diagnosis not present

## 2018-10-19 NOTE — Therapy (Signed)
[facility] [address], [place], [place], [address] Phone: [phone]   Fax:  [phone]  Physical Therapy Evaluation  Patient Details  Name: Doris Wilkerson MRN: [id number] Date of Birth: 1946/05/03 Referring Provider (PT): Grant Fontana, MD   Encounter Date: 10/19/2018  PT End of Session - 10/19/18 1134    Visit Number  1    Date for PT Re-Evaluation  12/18/17    Authorization Type  Medicare A and B    Authorization Time Period  10/19/18 to 12/18/18    PT Start Time  1058    PT Stop Time  1138    PT Time Calculation (min)  40 min    Activity Tolerance  No increased pain;Patient tolerated treatment well    Behavior During Therapy  [facility] for tasks assessed/performed       Past Medical History:  Diagnosis Date  . Asthma   . Cataract   . GERD (gastroesophageal reflux disease)   . Hyperlipidemia   . Hypertension   . Thyroid disease     Past Surgical History:  Procedure Laterality Date  . Cataract Surgery    . CORONARY ANGIOPLASTY WITH STENT PLACEMENT    . EYE SURGERY    . ORTHOPEDIC SURGERY  2012  . TONSILLECTOMY  1951    There were no vitals filed for this visit.   Subjective Assessment - 10/19/18 1059    Subjective  Pt states that she has had chronic Rt shoulder issues. It typically will flare up for several weeks at a time. She re-injured the shoulder when she was lifting a 40# bag of mulch. She had alot of weakness and pain initially, which has improved a great deal since she went to her referring physician. She will notice pain at night and when trying to lift objects.     Pertinent History  chronic Rt shoulder pain    Currently in Pain?  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       Cognition   Overall Cognitive Status  Within Functional Limits for tasks assessed      Observation/Other Assessments   Focus on Therapeutic Outcomes (FOTO)   43% limited       ROM / Strength   AROM / PROM / Strength  AROM;Strength;PROM      AROM   Overall AROM Comments  Rt reach behind head: painful and C7; Lt pain free dysfunctional; reach behind back Rt inferior border (+) pain, Lt pain free    AROM Assessment Site  Shoulder    Right/Left Shoulder  Right;Left    Right Shoulder Extension  --   full   Right Shoulder Flexion  150 Degrees   pain lowering   Right Shoulder ABduction  130 Degrees   pain lowering      PROM   Overall PROM Comments  Rt shoulder IR limited 20 deg in scap plane; all others full       Strength   Strength Assessment Site  Shoulder    Right/Left Shoulder  Right;Left    Right Shoulder Flexion  3+/5    Right Shoulder ABduction  3+/5    Right Shoulder Internal Rotation  4/5    Right Shoulder External Rotation  4/5   Pain with this    Left Shoulder Flexion  4/5    Left Shoulder ABduction  4/5    Left Shoulder Internal Rotation  5/5    Left Shoulder External Rotation  4/5      Palpation   Palpation comment  tenderness along Rt teres, Rt bicep and bicep insertion                Objective measurements completed on examination: See above findings.      [facility] Adult PT Treatment/Exercise - 10/19/18 0001      Exercises   Exercises  Shoulder      Shoulder Exercises: Standing   Protraction  Both;10 reps    Protraction Limitations  closed chain      Shoulder Exercises: ROM/Strengthening   Other ROM/Strengthening Exercises  Rt shoulder isometrics 3x5 sec flexion, abduction, extension, external rotation and internal rotation for HEP demo               PT Education - 10/19/18 1359    Education Details  eval findings/POC; implented HEP    Person(s) Educated  Patient    Methods  Explanation;Handout;Verbal cues    Comprehension  Verbalized understanding;Returned demonstration       PT Short Term Goals - 10/19/18 1351      PT SHORT TERM GOAL #1   Title  Pt will demo consistency and independence with HEP to improve strength and flexibility.     Time  3    Period  Weeks    Status  New    Target Date  11/09/18      PT SHORT TERM GOAL #2   Title  Pt will have atleast 160 deg of active shoulder flexion and abduction which will assist with reaching over head into her cabinets.     Time  3    Period  Weeks    Status  New        PT Long Term Goals - 10/19/18 1352      PT LONG TERM GOAL #1   Title  Pt will have improved Rt shoulder strength to 4/5 MMT which will assist with lifting objects into her cabinets at home.     Time  8    Period  Weeks    Status  New    Target Date  12/18/17      PT LONG TERM GOAL #2   Title  Pt will be able to stack cones with her RUE into the bottom shelf of the cabinets without increase in pain, x20 reps.    Time  8    Period  Weeks    Status  New      PT LONG TERM GOAL #3   Title  Pt will report atleast 60% improvement in her strength and pain to increase activity participation.     Time  8    Period  Weeks    Status  New      PT LONG TERM GOAL #4   Title  Pt will report being able to sleep through the night atleast 4  days a week without increase in Rt shoulder pain which causes her to wake up.     Time  8    Period  Weeks    Status  New      PT LONG TERM GOAL #5   Title  Pt will demo improved scapula control, evident by her ability to elevate her shoulder without noted scapula medial border winging.     Time  8    Period  Weeks    Status  New             Plan - 10/19/18 1139    Clinical Impression Statement  Pt is a pleasant 72 y.o F referred to OPPT with recent  exacerbation of chronic Rt shoulder pain. She reinjured her arm while attempting to lift a heavy bag. Since the recent injury, her pain has improved, but she still has RUE weakness and pain when sleeping or lifting objects in front of her. She does demonstrate signs of periscapular weakness with scapula winging on the Rt, in addition to limited end range active shoulder elevation and passive internal rotation. Pt also has palpable tenderness of the bicep, biceps tendon and teres muscle group. She has had success with OPPT in the past, and would benefit from skilled PT to address her current limitations in ROM, strength and shoulder control to facilitate return to daily activity without significant pain or limitation.     History and Personal Factors relevant to plan of care:  Chronic Rt shoulder pain    Clinical Presentation  Stable    Clinical Presentation due to:  improved since recent exacerbation    Clinical Decision Making  Low    Rehab Potential  Good    PT Frequency  2x / week    PT Duration  8 weeks    PT Treatment/Interventions  ADLs/Self Care Home Management;Therapeutic exercise;Electrical Stimulation;Cryotherapy;Iontophoresis 4mg /ml Dexamethasone;Moist Heat;Neuromuscular re-education;Patient/family education;Manual techniques;Passive range of motion;Dry needling    PT Next Visit Plan  progress HEP; posterior shoulder strength; shoulder IR mobilization; scap strength progression in closed chain       Patient will benefit from skilled therapeutic intervention in order to improve the following deficits and impairments:  Decreased activity tolerance, Decreased strength, Impaired flexibility, Impaired UE functional use, Pain, Postural dysfunction, Improper body mechanics, Decreased range of motion, Increased muscle spasms, Hypomobility  Visit Diagnosis: Acute pain of right shoulder  Muscle weakness (generalized)     Problem List Patient Active Problem List   Diagnosis Date Noted  .  Hordeolum externum of left lower eyelid 06/28/2018  . CAD (coronary artery disease) 12/09/2011  . Dyslipidemia 12/09/2011  . Osteopenia 12/09/2011  . Hypothyroid 12/09/2011  . Asthma 12/09/2011    2:00 PM,10/19/18 Sherol Dade PT, DPT [place] [place] [facility] [address] [place], [place], [address] Phone: [phone]   Fax:  [phone]  Name: Doris Wilkerson MRN: [id number] Date of Birth: 1946/03/13

## 2018-10-19 NOTE — Patient Instructions (Signed)
Access Code: [email or web address]  URL: [email or web address]  Date: 10/19/2018  Prepared by: Sherol Dade   Exercises  Isometric Shoulder Flexion at Wall - 10 reps - 3 sets - 2x daily - 7x weekly  Isometric Shoulder Abduction at Wall - 10 reps - 3 sets - 2x daily - 7x weekly  Isometric Shoulder External Rotation at Wall - 10 reps - 3 sets - 2x daily - 7x weekly  Isometric Shoulder Extension at Wall - 10 reps - 3 sets - 2x daily - 7x weekly  Scapular Protraction at [place] reps - 2 sets - 2x daily - 7x weekly    [place] [facility] [address] [place], [place] [address] Phone # [phone] Fax [phone]

## 2018-11-01 ENCOUNTER — Ambulatory Visit: Payer: Medicare Other | Attending: Family Medicine

## 2018-11-01 DIAGNOSIS — M6281 Muscle weakness (generalized): Secondary | ICD-10-CM | POA: Insufficient documentation

## 2018-11-01 DIAGNOSIS — M25511 Pain in right shoulder: Secondary | ICD-10-CM | POA: Insufficient documentation

## 2018-11-01 NOTE — Patient Instructions (Signed)
Access Code: [email or web address]  URL: [email or web address]  Date: 11/01/2018  Prepared by: Sigurd Sos   Exercises  Doorway Pec Stretch at 90 Degrees Abduction - 1 sets - 10 hold - 2x daily - 7x weekly  Supine Bilateral Shoulder External Rotation with Resistance - 10 reps - 2 sets - 2x daily - 7x weekly  Supine Shoulder Flexion Extension AAROM with Dowel - 10 reps - 1 sets - 5 hold - 2x daily - 7x weekly

## 2018-11-01 NOTE — Therapy (Signed)
[facility] [address], [place], [place], [address] Phone: [phone]   Fax:  [phone]  Physical Therapy Treatment  Patient Details  Name: Doris Wilkerson MRN: [id number] Date of Birth: 08-Sep-1946 Referring Provider (PT): Grant Fontana, MD   Encounter Date: 11/01/2018  PT End of Session - 11/01/18 1616    Visit Number  2    Date for PT Re-Evaluation  12/18/17    Authorization Type  Medicare A and B    Authorization Time Period  10/19/18 to 12/18/18    PT Start Time  1535    PT Stop Time  1616    PT Time Calculation (min)  41 min    Activity Tolerance  No increased pain;Patient tolerated treatment well    Behavior During Therapy  [facility] for tasks assessed/performed       Past Medical History:  Diagnosis Date  . Asthma   . Cataract   . GERD (gastroesophageal reflux disease)   . Hyperlipidemia   . Hypertension   . Thyroid disease     Past Surgical History:  Procedure Laterality Date  . Cataract Surgery    . CORONARY ANGIOPLASTY WITH STENT PLACEMENT    . EYE SURGERY    . ORTHOPEDIC SURGERY  2012  . TONSILLECTOMY  1951    There were no vitals filed for this visit.  Subjective Assessment - 11/01/18 1538    Subjective  I am able to do 2 of my isometric exercises without pain, 2 of them (IR/ER) increase the pain.  I am not sure if I am doing them correctly.    Currently in Pain?  No/denies   not using the Rt UE as much                      [facility] Adult PT Treatment/Exercise - 11/01/18 0001      Exercises   Exercises  Shoulder      Shoulder Exercises: Supine   External Rotation  Strengthening;Both;20 reps;Theraband    Theraband Level (Shoulder External Rotation)  Level 1 (Yellow)    Other Supine Exercises  cane flexion x 15      Shoulder Exercises: Pulleys   Flexion  3 minutes    ABduction  2 minutes      Shoulder Exercises: Isometric Strengthening   External Rotation  5X5"    tactile and verbal cues   Internal Rotation  5X5"   tactile and demo cues     Shoulder Exercises: Stretch   Corner Stretch  3 reps;10 seconds;5 reps    Other Shoulder Stretches  finger ladder x 10 to tolerance             PT Education - 11/01/18 1604    Education Details   Access Code: [email or web address]     Person(s) Educated  Patient    Methods  Explanation;Handout;Demonstration;Tactile cues    Comprehension  Verbalized understanding;Returned demonstration       PT Short Term Goals - 10/19/18 1351      PT SHORT TERM GOAL #1   Title  Pt will demo consistency and independence with HEP to improve strength and flexibility.     Time  3    Period  Weeks    Status  New    Target Date  11/09/18      PT SHORT TERM GOAL #2   Title  Pt will have atleast 160 deg of active shoulder flexion and abduction which will  assist with reaching over head into her cabinets.     Time  3    Period  Weeks    Status  New        PT Long Term Goals - 10/19/18 1352      PT LONG TERM GOAL #1   Title  Pt will have improved Rt shoulder strength to 4/5 MMT which will assist with lifting objects into her cabinets at home.     Time  8    Period  Weeks    Status  New    Target Date  12/18/17      PT LONG TERM GOAL #2   Title  Pt will be able to stack cones with her RUE into the bottom shelf of the cabinets without increase in pain, x20 reps.    Time  8    Period  Weeks    Status  New      PT LONG TERM GOAL #3   Title  Pt will report atleast 60% improvement in her strength and pain to increase activity participation.     Time  8    Period  Weeks    Status  New      PT LONG TERM GOAL #4   Title  Pt will report being able to sleep through the night atleast 4 days a week without increase in Rt shoulder pain which causes her to wake up.     Time  8    Period  Weeks    Status  New      PT LONG TERM GOAL #5   Title  Pt will demo improved scapula control, evident by her ability to elevate her  shoulder without noted scapula medial border winging.     Time  8    Period  Weeks    Status  New              Patient will benefit from skilled therapeutic intervention in order to improve the following deficits and impairments:     Visit Diagnosis: Muscle weakness (generalized)  Acute pain of right shoulder     Problem List Patient Active Problem List   Diagnosis Date Noted  . Hordeolum externum of left lower eyelid 06/28/2018  . CAD (coronary artery disease) 12/09/2011  . Dyslipidemia 12/09/2011  . Osteopenia 12/09/2011  . Hypothyroid 12/09/2011  . Asthma 12/09/2011   Sigurd Sos, PT 11/01/18 4:18 PM d [facility] [address] [place], [place], [address] Phone: [phone]   Fax:  [phone]  Name: Doris Wilkerson MRN: [id number] Date of Birth: 10-Sep-1946

## 2018-11-04 ENCOUNTER — Ambulatory Visit: Payer: Medicare Other | Admitting: Physical Therapy

## 2018-11-04 ENCOUNTER — Encounter: Payer: Self-pay | Admitting: Physical Therapy

## 2018-11-04 DIAGNOSIS — M25511 Pain in right shoulder: Secondary | ICD-10-CM | POA: Diagnosis not present

## 2018-11-04 DIAGNOSIS — M6281 Muscle weakness (generalized): Secondary | ICD-10-CM | POA: Diagnosis not present

## 2018-11-04 NOTE — Patient Instructions (Signed)
Access Code: [email or web address]  URL: [email or web address]  Date: 11/04/2018  Prepared by: Sherol Dade   Exercises  Supine Bilateral Shoulder Protraction - 20 reps - 1x daily - 7x weekly  Sidelying Shoulder ER with Towel and Dumbbell - 10-15 reps - 2 sets - 1x daily - 7x weekly  Sidelying Shoulder Horizontal Abduction - 10 reps - 2 sets - 1x daily - 7x weekly  Squatting Shoulder Row with Anchored Resistance - 15 reps - 2 sets - 1x daily - 7x weekly    [facility] [address], [place], [place] [address] Phone # [phone] Fax [phone]

## 2018-11-04 NOTE — Therapy (Signed)
[facility] [address], [place], [place], [address] Phone: [phone]   Fax:  [phone]  Physical Therapy Treatment  Patient Details  Name: Doris Wilkerson MRN: [id number] Date of Birth: [birth year] Referring Provider (PT): Grant Fontana, MD   Encounter Date: 11/04/2018  PT End of Session - 11/04/18 1527    Visit Number  3    Date for PT Re-Evaluation  12/18/17    Authorization Type  Medicare A and B    Authorization Time Period  10/19/18 to 12/18/18    PT Start Time  1447    PT Stop Time  1529    PT Time Calculation (min)  42 min    Activity Tolerance  No increased pain;Patient tolerated treatment well    Behavior During Therapy  [facility] for tasks assessed/performed       Past Medical History:  Diagnosis Date  . Asthma   . Cataract   . GERD (gastroesophageal reflux disease)   . Hyperlipidemia   . Hypertension   . Thyroid disease     Past Surgical History:  Procedure Laterality Date  . Cataract Surgery    . CORONARY ANGIOPLASTY WITH STENT PLACEMENT    . EYE SURGERY    . ORTHOPEDIC SURGERY  2012  . TONSILLECTOMY  1951    There were no vitals filed for this visit.  Subjective Assessment - 11/04/18 1451    Subjective  Pt states that things are going well. She is using her pulley alot at home without much difficulty.     Currently in Pain?  No/denies                       [facility] Adult PT Treatment/Exercise - 11/04/18 0001      Shoulder Exercises: Supine   Protraction  Both;15 reps    Protraction Weight (lbs)  3      Shoulder Exercises: Sidelying   External Rotation  Right;5 reps    External Rotation Weight (lbs)  2# dumbbell, x2 sets     Other Sidelying Exercises  Rt shoulder horizontal abduction x20 reps       Shoulder Exercises: Standing   External Rotation  Right;10 reps    External Rotation Limitations  isometric hold 5 sec     Internal Rotation  Right;10 reps    Internal  Rotation Limitations  isometric hold 5 sec       Shoulder Exercises: ROM/Strengthening   Other ROM/Strengthening Exercises  rows with green TB 2x15 reps                PT Short Term Goals - 11/04/18 1535      PT SHORT TERM GOAL #1   Title  Pt will demo consistency and independence with HEP to improve strength and flexibility.     Time  3    Period  Weeks    Status  Achieved      PT SHORT TERM GOAL #2   Title  Pt will have atleast 160 deg of active shoulder flexion and abduction which will assist with reaching over head into her cabinets.     Time  3    Period  Weeks    Status  New        PT Long Term Goals - 10/19/18 1352      PT LONG TERM GOAL #1   Title  Pt will have improved Rt shoulder strength to 4/5 MMT which will assist  with lifting objects into her cabinets at home.     Time  8    Period  Weeks    Status  New    Target Date  12/18/17      PT LONG TERM GOAL #2   Title  Pt will be able to stack cones with her RUE into the bottom shelf of the cabinets without increase in pain, x20 reps.    Time  8    Period  Weeks    Status  New      PT LONG TERM GOAL #3   Title  Pt will report atleast 60% improvement in her strength and pain to increase activity participation.     Time  8    Period  Weeks    Status  New      PT LONG TERM GOAL #4   Title  Pt will report being able to sleep through the night atleast 4 days a week without increase in Rt shoulder pain which causes her to wake up.     Time  8    Period  Weeks    Status  New      PT LONG TERM GOAL #5   Title  Pt will demo improved scapula control, evident by her ability to elevate her shoulder without noted scapula medial border winging.     Time  8    Period  Weeks    Status  New            Plan - 11/04/18 1531    Clinical Impression Statement  Pt reports continued HEP adherence without exacerbation of shoulder pain. Session focused on updating her HEP to promote posterior shoulder strength.  Pt did require moderate verbal/tactile cues to improve scapula retraction and posterior rotator cuff activation during sidelying exercise, but she noted appropriate levels of muscle fatigue. Pt had good understanding of HEP updates and reported no increase in shoulder pain end of session.     Rehab Potential  Good    PT Frequency  2x / week    PT Duration  8 weeks    PT Treatment/Interventions  ADLs/Self Care Home Management;Therapeutic exercise;Electrical Stimulation;Cryotherapy;Iontophoresis 4mg /ml Dexamethasone;Moist Heat;Neuromuscular re-education;Patient/family education;Manual techniques;Passive range of motion;Dry needling    PT Next Visit Plan  progress posterior shoulder strength; shoulder IR mobilization; scap strength progression in closed chain    PT Home Exercise Plan  [email or web address]    Consulted and Agree with Plan of Care  Patient       Patient will benefit from skilled therapeutic intervention in order to improve the following deficits and impairments:  Decreased activity tolerance, Decreased strength, Impaired flexibility, Impaired UE functional use, Pain, Postural dysfunction, Improper body mechanics, Decreased range of motion, Increased muscle spasms, Hypomobility  Visit Diagnosis: Muscle weakness (generalized)  Acute pain of right shoulder     Problem List Patient Active Problem List   Diagnosis Date Noted  . Hordeolum externum of left lower eyelid 06/28/2018  . CAD (coronary artery disease) 12/09/2011  . Dyslipidemia 12/09/2011  . Osteopenia 12/09/2011  . Hypothyroid 12/09/2011  . Asthma 12/09/2011    3:36 PM,11/04/18 Sherol Dade PT, DPT [place] [facility] [place] [facility] [address] [place], [place], [address] Phone: [phone]   Fax:  [phone]  Name: Doris Wilkerson MRN: [id number] Date of Birth: 30-Sep-1946

## 2018-11-10 ENCOUNTER — Ambulatory Visit: Payer: Medicare Other

## 2018-11-10 DIAGNOSIS — M25511 Pain in right shoulder: Secondary | ICD-10-CM

## 2018-11-10 DIAGNOSIS — M6281 Muscle weakness (generalized): Secondary | ICD-10-CM | POA: Diagnosis not present

## 2018-11-10 NOTE — Therapy (Signed)
[facility] [address] [place], [place], [address] Phone: [phone]   Fax:  [phone]  Physical Therapy Treatment  Patient Details  Name: Doris [place] MRN: [id number] Date of Birth: September 06, 1946 Referring Provider (PT): Grant Fontana, MD   Encounter Date: 11/10/2018  PT End of Session - 11/10/18 0928    Visit Number  4    Date for PT Re-Evaluation  12/18/17    Authorization Type  Medicare A and B    PT Start Time  0848    PT Stop Time  0929    PT Time Calculation (min)  41 min    Activity Tolerance  No increased pain;Patient tolerated treatment well    Behavior During Therapy  [facility] for tasks assessed/performed       Past Medical History:  Diagnosis Date  . Asthma   . Cataract   . GERD (gastroesophageal reflux disease)   . Hyperlipidemia   . Hypertension   . Thyroid disease     Past Surgical History:  Procedure Laterality Date  . Cataract Surgery    . CORONARY ANGIOPLASTY WITH STENT PLACEMENT    . EYE SURGERY    . ORTHOPEDIC SURGERY  2012  . TONSILLECTOMY  1951    There were no vitals filed for this visit.  Subjective Assessment - 11/10/18 0853    Subjective  I have a few questions about my exercises.      Currently in Pain?  No/denies         [facility] PT Assessment - 11/10/18 0001      AROM   Right Shoulder Flexion  160 Degrees    Right Shoulder ABduction  140 Degrees                   [facility] Adult PT Treatment/Exercise - 11/10/18 0001      Exercises   Exercises  Neck      Shoulder Exercises: Supine   Protraction  Both;15 reps    Protraction Weight (lbs)  3    External Rotation  Strengthening;Both;20 reps;Theraband    Theraband Level (Shoulder External Rotation)  Level 1 (Yellow)      Shoulder Exercises: Sidelying   External Rotation  Right;Strengthening;20 reps    External Rotation Weight (lbs)  2#    Other Sidelying Exercises  Rt shoulder horizontal abduction x20 reps  1#      Shoulder Exercises: Standing   Other Standing Exercises  cone stacking: 2nd shelf with Rt UE x 2 minutes   fatigue with this     Shoulder Exercises: Pulleys   Flexion  3 minutes    ABduction  --      Shoulder Exercises: ROM/Strengthening   Other ROM/Strengthening Exercises  rows with green TB 2x15 reps       Shoulder Exercises: Stretch   Corner Stretch  3 reps;10 seconds;5 reps    Other Shoulder Stretches  finger ladder x 10 to tolerance   1#     Neck Exercises: Stretches   Upper Trapezius Stretch  3 reps;20 seconds;Left;Right               PT Short Term Goals - 11/10/18 0854      PT SHORT TERM GOAL #1   Title  Pt will demo consistency and independence with HEP to improve strength and flexibility.     Status  Achieved        PT Long Term Goals - 10/19/18 1352  PT LONG TERM GOAL #1   Title  Pt will have improved Rt shoulder strength to 4/5 MMT which will assist with lifting objects into her cabinets at home.     Time  8    Period  Weeks    Status  New    Target Date  12/18/17      PT LONG TERM GOAL #2   Title  Pt will be able to stack cones with her RUE into the bottom shelf of the cabinets without increase in pain, x20 reps.    Time  8    Period  Weeks    Status  New      PT LONG TERM GOAL #3   Title  Pt will report atleast 60% improvement in her strength and pain to increase activity participation.     Time  8    Period  Weeks    Status  New      PT LONG TERM GOAL #4   Title  Pt will report being able to sleep through the night atleast 4 days a week without increase in Rt shoulder pain which causes her to wake up.     Time  8    Period  Weeks    Status  New      PT LONG TERM GOAL #5   Title  Pt will demo improved scapula control, evident by her ability to elevate her shoulder without noted scapula medial border winging.     Time  8    Period  Weeks    Status  New            Plan - 11/10/18 0910    Clinical Impression  Statement  Pt reports that she is sleeping without limitation due to Rt shoulder pain now.  Pt has been consistent and compliant with HEP.  Session was spent reviewing HEP technique per pt request and PT provided verbal and tactile cues for technique to avoid upper trap activation and substitution with the trunk.  Pt with main complaint of reduced eccentric control after overhead reaching so we addressed this with cone stack today.  Pt will continue to benefit from skilled PT to address scapular and Rt UE strength and endurance deficits.      Rehab Potential  Good    PT Frequency  2x / week    PT Duration  8 weeks    PT Treatment/Interventions  ADLs/Self Care Home Management;Therapeutic exercise;Electrical Stimulation;Cryotherapy;Iontophoresis 4mg /ml Dexamethasone;Moist Heat;Neuromuscular re-education;Patient/family education;Manual techniques;Passive range of motion;Dry needling    PT Next Visit Plan  progress posterior shoulder strength; work on eccentric control and overhead reaching.  ; scap strength progression in closed chain    PT Home Exercise Plan  [email or web address]    Recommended Other Services  initial certi is signed    Consulted and Agree with Plan of Care  Patient       Patient will benefit from skilled therapeutic intervention in order to improve the following deficits and impairments:  Decreased activity tolerance, Decreased strength, Impaired flexibility, Impaired UE functional use, Pain, Postural dysfunction, Improper body mechanics, Decreased range of motion, Increased muscle spasms, Hypomobility  Visit Diagnosis: Muscle weakness (generalized)  Acute pain of right shoulder     Problem List Patient Active Problem List   Diagnosis Date Noted  . Hordeolum externum of left lower eyelid 06/28/2018  . CAD (coronary artery disease) 12/09/2011  . Dyslipidemia 12/09/2011  . Osteopenia 12/09/2011  . Hypothyroid 12/09/2011  . Asthma 12/09/2011  Sigurd Sos, PT 11/10/18 9:31  AM  [facility] [address], [place], [place], [address] Phone: [phone]   Fax:  [phone]  Name: SHATERICA MCCLATCHY MRN: [id number] Date of Birth: [birth year]

## 2018-11-12 ENCOUNTER — Ambulatory Visit: Payer: Medicare Other | Admitting: Physical Therapy

## 2018-11-12 ENCOUNTER — Encounter: Payer: Self-pay | Admitting: Physical Therapy

## 2018-11-12 DIAGNOSIS — M6281 Muscle weakness (generalized): Secondary | ICD-10-CM

## 2018-11-12 DIAGNOSIS — M25511 Pain in right shoulder: Secondary | ICD-10-CM

## 2018-11-12 NOTE — Therapy (Signed)
[facility] [address], [place], [place], [address] Phone: [phone]   Fax:  [phone]  Physical Therapy Treatment  Patient Details  Name: Doris [place] MRN: [id number] Date of Birth: Jun 07, 1946 Referring Provider (PT): Grant Fontana, MD   Encounter Date: 11/12/2018  PT End of Session - 11/12/18 1011    Visit Number  5    Date for PT Re-Evaluation  12/18/17    Authorization Type  Medicare A and B    PT Start Time  0932    PT Stop Time  1010    PT Time Calculation (min)  38 min    Activity Tolerance  No increased pain;Patient tolerated treatment well    Behavior During Therapy  [facility] for tasks assessed/performed       Past Medical History:  Diagnosis Date  . Asthma   . Cataract   . GERD (gastroesophageal reflux disease)   . Hyperlipidemia   . Hypertension   . Thyroid disease     Past Surgical History:  Procedure Laterality Date  . Cataract Surgery    . CORONARY ANGIOPLASTY WITH STENT PLACEMENT    . EYE SURGERY    . ORTHOPEDIC SURGERY  2012  . TONSILLECTOMY  1951    There were no vitals filed for this visit.  Subjective Assessment - 11/12/18 0936    Subjective  Pt states that things are going well. She is a little sore on the outside of her shoulder but thinks she needs to do more reps at home.     Currently in Pain?  No/denies                       [facility] Adult PT Treatment/Exercise - 11/12/18 0001      Neck Exercises: Machines for Strengthening   UBE (Upper Arm Bike)  L1 x2 min forward/backward PT present to discuss progress       Shoulder Exercises: Supine   Flexion  Both;Strengthening;10 reps    Flexion Limitations  yellow TB around wrists to encourage horizontal abduction    Other Supine Exercises  Rt shoulder flexion eccentric lower with yellow TB x10 reps      Shoulder Exercises: Standing   Other Standing Exercises  B shoulder W's using yellow TB x12 reps with PT cues to  encourage shoulder ER      Shoulder Exercises: ROM/Strengthening   Wall Wash  Rt shoulder 3 way slide 2x5 reps (yellow TB around elbows)    Pushups  10 reps    Pushups Limitations  x2 sets, inclined pushup on countertop 2x10 reps     Proximal Shoulder Strengthening, Supine  shoulder 110 deg flexion with external perturbations at forearm 3x20 sec              PT Education - 11/12/18 1010    Education Details  technique with therex    Person(s) Educated  Patient    Methods  Explanation;Verbal cues;Tactile cues    Comprehension  Verbalized understanding;Returned demonstration       PT Short Term Goals - 11/10/18 0854      PT SHORT TERM GOAL #1   Title  Pt will demo consistency and independence with HEP to improve strength and flexibility.     Status  Achieved        PT Long Term Goals - 10/19/18 1352      PT LONG TERM GOAL #1   Title  Pt will have improved  Rt shoulder strength to 4/5 MMT which will assist with lifting objects into her cabinets at home.     Time  8    Period  Weeks    Status  New    Target Date  12/18/17      PT LONG TERM GOAL #2   Title  Pt will be able to stack cones with her RUE into the bottom shelf of the cabinets without increase in pain, x20 reps.    Time  8    Period  Weeks    Status  New      PT LONG TERM GOAL #3   Title  Pt will report atleast 60% improvement in her strength and pain to increase activity participation.     Time  8    Period  Weeks    Status  New      PT LONG TERM GOAL #4   Title  Pt will report being able to sleep through the night atleast 4 days a week without increase in Rt shoulder pain which causes her to wake up.     Time  8    Period  Weeks    Status  New      PT LONG TERM GOAL #5   Title  Pt will demo improved scapula control, evident by her ability to elevate her shoulder without noted scapula medial border winging.     Time  8    Period  Weeks    Status  New            Plan - 11/12/18 1011     Clinical Impression Statement  Pt continues to make steady progress towards her goals. She was able to complete atleast 8 reps of eccentric Rt shoulder flexion without increase in shoulder discomfort this session. She does require cuing from therapist to improve muscle activation, but she had improved understanding of serratus anterior muscle activation during shoulder elevation exercise. Pt had minimal muscle soreness of the posterior shoulder reported end of session. Will continue with current POC to progress shoulder control, eccentric strength and scapula endurance moving forward.     Rehab Potential  Good    PT Frequency  2x / week    PT Duration  8 weeks    PT Treatment/Interventions  ADLs/Self Care Home Management;Therapeutic exercise;Electrical Stimulation;Cryotherapy;Iontophoresis 4mg /ml Dexamethasone;Moist Heat;Neuromuscular re-education;Patient/family education;Manual techniques;Passive range of motion;Dry needling    PT Next Visit Plan  progress posterior shoulder strength; work on eccentric control and overhead reaching.  ; scap strength progression in closed chain    PT Home Exercise Plan  [email or web address]    Consulted and Agree with Plan of Care  Patient       Patient will benefit from skilled therapeutic intervention in order to improve the following deficits and impairments:  Decreased activity tolerance, Decreased strength, Impaired flexibility, Impaired UE functional use, Pain, Postural dysfunction, Improper body mechanics, Decreased range of motion, Increased muscle spasms, Hypomobility  Visit Diagnosis: Muscle weakness (generalized)  Acute pain of right shoulder     Problem List Patient Active Problem List   Diagnosis Date Noted  . Hordeolum externum of left lower eyelid 06/28/2018  . CAD (coronary artery disease) 12/09/2011  . Dyslipidemia 12/09/2011  . Osteopenia 12/09/2011  . Hypothyroid 12/09/2011  . Asthma 12/09/2011     10:14 AM,11/12/18 Sherol Dade PT,  DPT [place] [facility] [address] [place],  [place], [address] Phone: [phone]   Fax:  [phone]  Name: Doris [place] MRN: [id number] Date of Birth: Jul 07, 1946

## 2018-11-15 ENCOUNTER — Encounter: Payer: Self-pay | Admitting: Family Medicine

## 2018-11-15 DIAGNOSIS — E039 Hypothyroidism, unspecified: Secondary | ICD-10-CM

## 2018-11-15 DIAGNOSIS — Z1321 Encounter for screening for nutritional disorder: Secondary | ICD-10-CM

## 2018-11-15 DIAGNOSIS — R5383 Other fatigue: Secondary | ICD-10-CM

## 2018-11-16 ENCOUNTER — Encounter: Payer: Self-pay | Admitting: Physical Therapy

## 2018-11-16 ENCOUNTER — Ambulatory Visit: Payer: Medicare Other | Admitting: Physical Therapy

## 2018-11-16 DIAGNOSIS — M6281 Muscle weakness (generalized): Secondary | ICD-10-CM

## 2018-11-16 DIAGNOSIS — M25511 Pain in right shoulder: Secondary | ICD-10-CM | POA: Diagnosis not present

## 2018-11-16 NOTE — Patient Instructions (Signed)
Access Code: [email or web address]  URL: [email or web address]  Date: 11/16/2018  Prepared by: Sherol Dade   Exercises  Isometric Shoulder Flexion at Wall - 10 reps - 3 sets - 2x daily - 7x weekly  Isometric Shoulder Abduction at Wall - 10 reps - 3 sets - 2x daily - 7x weekly  Isometric Shoulder External Rotation at [place] - 10 reps - 3 sets - 2x daily - 7x weekly  Isometric Shoulder Extension at Wall - 10 reps - 3 sets - 2x daily - 7x weekly  Doorway Pec Stretch at 90 Degrees Abduction - 1 sets - 10 hold - 2x daily - 7x weekly  Supine Bilateral Shoulder External Rotation with Resistance - 10 reps - 2 sets - 2x daily - 7x weekly  Seated Shoulder Diagonal with Resistance - 10 reps - 2 sets - 1x daily - 7x weekly  Plank on Table with Scapular Protraction Retraction - 15 reps - 2 sets - 1x daily - 7x weekly    [facility] [address], [place], [place] [address] Phone # [phone] Fax [phone]

## 2018-11-16 NOTE — Therapy (Signed)
[facility] [address] [place], [place], [address] Phone: [phone]   Fax:  [phone]  Physical Therapy Treatment  Patient Details  Name: Doris [place] MRN: [id number] Date of Birth: 11-26-1945 Referring Provider (PT): Grant Fontana, MD   Encounter Date: 11/16/2018  PT End of Session - 11/16/18 0816    Visit Number  6    Date for PT Re-Evaluation  12/18/17    Authorization Type  Medicare A and B    PT Start Time  0804    PT Stop Time  0845    PT Time Calculation (min)  41 min    Activity Tolerance  No increased pain;Patient tolerated treatment well    Behavior During Therapy  [facility] for tasks assessed/performed       Past Medical History:  Diagnosis Date  . Asthma   . Cataract   . GERD (gastroesophageal reflux disease)   . Hyperlipidemia   . Hypertension   . Thyroid disease     Past Surgical History:  Procedure Laterality Date  . Cataract Surgery    . CORONARY ANGIOPLASTY WITH STENT PLACEMENT    . EYE SURGERY    . ORTHOPEDIC SURGERY  2012  . TONSILLECTOMY  1951    There were no vitals filed for this visit.  Subjective Assessment - 11/16/18 0806    Subjective  Pt states that she had a rough night last night with her shoulder bothering her. Otherwise, it has been fine.    Currently in Pain?  No/denies         [facility] PT Assessment - 11/16/18 0001      AROM   Right Shoulder Flexion  160 Degrees    Right Shoulder ABduction  150 Degrees                   [facility] Adult PT Treatment/Exercise - 11/16/18 0001      Self-Care   Self-Care  Posture    Posture  sleeping with pillow underneath      Neck Exercises: Supine   Upper Extremity D2  Flexion;10 reps    UE D2 Limitations  x2 sets      Shoulder Exercises: Supine   External Rotation  Strengthening;Both;20 reps;Theraband    Theraband Level (Shoulder External Rotation)  Level 2 (Red)      Shoulder Exercises: Prone   Retraction  Limitations  RUE retraction with ER, x10 reps, PT providing heavy cues to decrease upper trap activation and shrug    Other Prone Exercises  RUE rows 2x10 reps with 4# dumbbell       Shoulder Exercises: ROM/Strengthening   Other ROM/Strengthening Exercises  incline serratus press, elbows extended x15 reps, x5 reps with downward dog press      Shoulder Exercises: Stretch   Other Shoulder Stretches  B lat stretch seated over foam roll, end range thoracic extension x15 reps              PT Education - 11/16/18 0813    Education Details  updated HEP; technique with therex    Person(s) Educated  Patient    Methods  Explanation;Verbal cues;Handout;Tactile cues    Comprehension  Verbalized understanding;Returned demonstration       PT Short Term Goals - 11/16/18 0816      PT SHORT TERM GOAL #1   Title  Pt will demo consistency and independence with HEP to improve strength and flexibility.     Time  3  Period  Weeks    Status  Achieved      PT SHORT TERM GOAL #2   Title  Pt will have atleast 160 deg of active shoulder flexion and abduction which will assist with reaching over head into her cabinets.     Baseline  flexion 160 deg, abduction 150 deg    Time  3    Period  Weeks    Status  Partially Met        PT Long Term Goals - 10/19/18 1352      PT LONG TERM GOAL #1   Title  Pt will have improved Rt shoulder strength to 4/5 MMT which will assist with lifting objects into her cabinets at home.     Time  8    Period  Weeks    Status  New    Target Date  12/18/17      PT LONG TERM GOAL #2   Title  Pt will be able to stack cones with her RUE into the bottom shelf of the cabinets without increase in pain, x20 reps.    Time  8    Period  Weeks    Status  New      PT LONG TERM GOAL #3   Title  Pt will report atleast 60% improvement in her strength and pain to increase activity participation.     Time  8    Period  Weeks    Status  New      PT LONG TERM GOAL #4    Title  Pt will report being able to sleep through the night atleast 4 days a week without increase in Rt shoulder pain which causes her to wake up.     Time  8    Period  Weeks    Status  New      PT LONG TERM GOAL #5   Title  Pt will demo improved scapula control, evident by her ability to elevate her shoulder without noted scapula medial border winging.     Time  8    Period  Weeks    Status  New            Plan - 11/16/18 0905    Clinical Impression Statement  Pt continues to make steady progress towards her goals. Her Rt shoulder flexion has increased to 160 deg and abduction to 150 deg without increase in pain. Pt's HEP was updated with a diagonal pattern and she did require some cuing with this to ensure proper activation of the posterior shoulder. Therapist continues to note heavy upper trap activation and shoulder shrug particularly during prone rows and will continue to monitor and address this in future sessions. Ended without increase in shoulder pain and pt education on recommended sleep positions for optimal comfort.     Rehab Potential  Good    PT Frequency  2x / week    PT Duration  8 weeks    PT Treatment/Interventions  ADLs/Self Care Home Management;Therapeutic exercise;Electrical Stimulation;Cryotherapy;Iontophoresis 71m/ml Dexamethasone;Moist Heat;Neuromuscular re-education;Patient/family education;Manual techniques;Passive range of motion;Dry needling    PT Next Visit Plan  prone shoulder strength (retraction, ER, etc.); work on eccentric control and overhead reaching. ; scap strength progression in closed chain    PT Home Exercise Plan  [email or web address]   Consulted and Agree with Plan of Care  Patient       Patient will benefit from skilled therapeutic intervention in order to improve the following deficits and  impairments:  Decreased activity tolerance, Decreased strength, Impaired flexibility, Impaired UE functional use, Pain, Postural dysfunction, Improper body  mechanics, Decreased range of motion, Increased muscle spasms, Hypomobility  Visit Diagnosis: Muscle weakness (generalized)  Acute pain of right shoulder     Problem List Patient Active Problem List   Diagnosis Date Noted  . Hordeolum externum of left lower eyelid 06/28/2018  . CAD (coronary artery disease) 12/09/2011  . Dyslipidemia 12/09/2011  . Osteopenia 12/09/2011  . Hypothyroid 12/09/2011  . Asthma 12/09/2011    9:42 AM,11/16/18 Sherol Dade PT, DPT [place] [facility] [address], [place], [place], [address] Phone: [phone]   Fax:  [phone]  Name: Doris [place] MRN: [id number] Date of Birth: 1945/12/29

## 2018-11-19 DIAGNOSIS — E039 Hypothyroidism, unspecified: Secondary | ICD-10-CM | POA: Diagnosis not present

## 2018-11-19 DIAGNOSIS — E785 Hyperlipidemia, unspecified: Secondary | ICD-10-CM | POA: Diagnosis not present

## 2018-11-19 DIAGNOSIS — R5383 Other fatigue: Secondary | ICD-10-CM | POA: Diagnosis not present

## 2018-11-19 NOTE — Addendum Note (Signed)
Addended by: Ileana Roup on: 11/19/2018 08:25 AM   Modules accepted: Orders

## 2018-11-20 LAB — CBC
Hematocrit: 38.1 % (ref 34.0–46.6)
Hemoglobin: 12.7 g/dL (ref 11.1–15.9)
MCH: 29.8 pg (ref 26.6–33.0)
MCHC: 33.3 g/dL (ref 31.5–35.7)
MCV: 89 fL (ref 79–97)
PLATELETS: 271 10*3/uL (ref 150–450)
RBC: 4.26 x10E6/uL (ref 3.77–5.28)
RDW: 12.7 % (ref 12.3–15.4)
WBC: 5.3 10*3/uL (ref 3.4–10.8)

## 2018-11-20 LAB — COMPREHENSIVE METABOLIC PANEL
ALT: 21 IU/L (ref 0–32)
AST: 22 IU/L (ref 0–40)
Albumin/Globulin Ratio: 3.6 — ABNORMAL HIGH (ref 1.2–2.2)
Albumin: 4.7 g/dL (ref 3.5–4.8)
Alkaline Phosphatase: 73 IU/L (ref 39–117)
BUN/Creatinine Ratio: 23 (ref 12–28)
BUN: 17 mg/dL (ref 8–27)
Bilirubin Total: 0.5 mg/dL (ref 0.0–1.2)
CO2: 23 mmol/L (ref 20–29)
Calcium: 10.1 mg/dL (ref 8.7–10.3)
Chloride: 103 mmol/L (ref 96–106)
Creatinine, Ser: 0.73 mg/dL (ref 0.57–1.00)
GFR calc Af Amer: 95 mL/min/{1.73_m2} (ref >59)
GFR calc non Af Amer: 83 mL/min/{1.73_m2} (ref >59)
Globulin, Total: 1.3 g/dL — ABNORMAL LOW (ref 1.5–4.5)
Glucose: 90 mg/dL (ref 65–99)
Potassium: 4.5 mmol/L (ref 3.5–5.2)
Sodium: 138 mmol/L (ref 134–144)
Total Protein: 6 g/dL (ref 6.0–8.5)

## 2018-11-20 LAB — LIPID PANEL
CHOL/HDL RATIO: 2.3 ratio (ref 0.0–4.4)
Cholesterol, Total: 179 mg/dL (ref 100–199)
HDL: 79 mg/dL (ref >39)
LDL Calculated: 87 mg/dL (ref 0–99)
Triglycerides: 67 mg/dL (ref 0–149)
VLDL Cholesterol Cal: 13 mg/dL (ref 5–40)

## 2018-11-20 LAB — TSH: TSH: 2.43 u[IU]/mL (ref 0.450–4.500)

## 2018-11-22 ENCOUNTER — Encounter: Payer: Self-pay | Admitting: Physical Therapy

## 2018-11-22 ENCOUNTER — Ambulatory Visit: Payer: Medicare Other | Admitting: Physical Therapy

## 2018-11-22 DIAGNOSIS — M6281 Muscle weakness (generalized): Secondary | ICD-10-CM | POA: Diagnosis not present

## 2018-11-22 DIAGNOSIS — M25511 Pain in right shoulder: Secondary | ICD-10-CM | POA: Diagnosis not present

## 2018-11-22 NOTE — Therapy (Signed)
[facility] [address] [place], [place], [address] Phone: [phone]   Fax:  [phone]  Physical Therapy Treatment  Patient Details  Name: Doris Wilkerson MRN: [id number] Date of Birth: October 03, 1946 Referring Provider (PT): Grant Fontana, MD   Encounter Date: 11/22/2018  PT End of Session - 11/22/18 0949    Visit Number  7    Date for PT Re-Evaluation  12/18/17    Authorization Type  Medicare A and B    PT Start Time  6270    PT Stop Time  1015    PT Time Calculation (min)  41 min    Activity Tolerance  No increased pain;Patient tolerated treatment well    Behavior During Therapy  [facility] for tasks assessed/performed       Past Medical History:  Diagnosis Date  . Asthma   . Cataract   . GERD (gastroesophageal reflux disease)   . Hyperlipidemia   . Hypertension   . Thyroid disease     Past Surgical History:  Procedure Laterality Date  . Cataract Surgery    . CORONARY ANGIOPLASTY WITH STENT PLACEMENT    . EYE SURGERY    . ORTHOPEDIC SURGERY  2012  . TONSILLECTOMY  1951    There were no vitals filed for this visit.  Subjective Assessment - 11/22/18 0938    Subjective  Pt states that she was able to sleep last night without pain. She is still working on her HEP regularly without any increase in pain.     Currently in Pain?  No/denies                       [facility] Adult PT Treatment/Exercise - 11/22/18 0001      Shoulder Exercises: Seated   Flexion  Both;Strengthening;10 reps;Weights    Flexion Weight (lbs)  2    Flexion Limitations  I's, Y's position      Shoulder Exercises: Prone   Extension  Both;10 reps;Weights    Extension Weight (lbs)  1    Extension Limitations  3 sets    Horizontal ABduction 1  Strengthening;Right;10 reps    Horizontal ABduction 1 Limitations  x2 sets, 1# dumbbell     Other Prone Exercises  RUE rows 3x10 reps with 5# dumbbell    Other Prone Exercises  B  serratus press plank on mat table x20 reps       Shoulder Exercises: Standing   Other Standing Exercises  incline plank walkout Lt/Rt along elevated mat table, yellow TB around wrists x4 trials Lt/Rt      Shoulder Exercises: Stretch   Other Shoulder Stretches  B lat stretch x10 sec standing with UE on mat table    Other Shoulder Stretches  Rt pec stretch in doorway 3x30 sec             PT Education - 11/22/18 1025    Education Details  technique with therex    Person(s) Educated  Patient    Methods  Explanation;Verbal cues    Comprehension  Verbalized understanding;Returned demonstration       PT Short Term Goals - 11/16/18 0816      PT SHORT TERM GOAL #1   Title  Pt will demo consistency and independence with HEP to improve strength and flexibility.     Time  3    Period  Weeks    Status  Achieved      PT SHORT TERM GOAL #  2   Title  Pt will have atleast 160 deg of active shoulder flexion and abduction which will assist with reaching over head into her cabinets.     Baseline  flexion 160 deg, abduction 150 deg    Time  3    Period  Weeks    Status  Partially Met        PT Long Term Goals - 11/22/18 0950      PT LONG TERM GOAL #1   Title  Pt will have improved Rt shoulder strength to 4/5 MMT which will assist with lifting objects into her cabinets at home.     Time  8    Period  Weeks    Status  On-going      PT LONG TERM GOAL #2   Title  Pt will be able to stack cones with her RUE into the bottom shelf of the cabinets without increase in pain, x20 reps.    Baseline  Pt able to complete with light dishes, but hesitant to lift milk jug    Time  8    Period  Weeks    Status  Partially Met      PT LONG TERM GOAL #3   Title  Pt will report atleast 60% improvement in her strength and pain to increase activity participation.     Time  8    Period  Weeks    Status  New      PT LONG TERM GOAL #4   Title  Pt will report being able to sleep through the night  atleast 4 days a week without increase in Rt shoulder pain which causes her to wake up.     Time  8    Period  Weeks    Status  New      PT LONG TERM GOAL #5   Title  Pt will demo improved scapula control, evident by her ability to elevate her shoulder without noted scapula medial border winging.     Time  8    Period  Weeks    Status  New            Plan - 11/22/18 1026    Clinical Impression Statement  Pt arrived reporting being able to sleep through the night last night without increase in pain. Continued with therex to promote posterior shoulder strength and decrease medial scapular border prominence. Pt was able to self-correct her technique with prone therex which is an improvement from last session. She has functional improvements in UE use, noting she is able to reach her back in the shower and reach overhead into her cabinet for light items without pain or difficulty. Will continue to promote shoulder endurance, strength and flexibility moving forward.    Rehab Potential  Good    PT Frequency  2x / week    PT Duration  8 weeks    PT Treatment/Interventions  ADLs/Self Care Home Management;Therapeutic exercise;Electrical Stimulation;Cryotherapy;Iontophoresis 16m/ml Dexamethasone;Moist Heat;Neuromuscular re-education;Patient/family education;Manual techniques;Passive range of motion;Dry needling    PT Next Visit Plan  prone shoulder strength (retraction, ER, etc.); work on eccentric control and overhead reaching. ; scap strength progression in closed chain    PT Home Exercise Plan  [id number]   Consulted and Agree with Plan of Care  Patient       Patient will benefit from skilled therapeutic intervention in order to improve the following deficits and impairments:  Decreased activity tolerance, Decreased strength, Impaired flexibility, Impaired UE  functional use, Pain, Postural dysfunction, Improper body mechanics, Decreased range of motion, Increased muscle spasms,  Hypomobility  Visit Diagnosis: Muscle weakness (generalized)  Acute pain of right shoulder     Problem List Patient Active Problem List   Diagnosis Date Noted  . Hordeolum externum of left lower eyelid 06/28/2018  . CAD (coronary artery disease) 12/09/2011  . Dyslipidemia 12/09/2011  . Osteopenia 12/09/2011  . Hypothyroid 12/09/2011  . Asthma 12/09/2011   10:31 AM,11/22/18 Sherol Dade PT, DPT [place] [facility] [address] [place], [place], [address] Phone: [phone]   Fax:  [phone]  Name: Doris Wilkerson MRN: [id number] Date of Birth: 29-Mar-1946

## 2018-11-25 ENCOUNTER — Ambulatory Visit: Payer: Medicare Other | Attending: Family Medicine | Admitting: Physical Therapy

## 2018-11-25 ENCOUNTER — Encounter: Payer: Self-pay | Admitting: Physical Therapy

## 2018-11-25 DIAGNOSIS — M25511 Pain in right shoulder: Secondary | ICD-10-CM

## 2018-11-25 DIAGNOSIS — M6281 Muscle weakness (generalized): Secondary | ICD-10-CM | POA: Insufficient documentation

## 2018-11-25 NOTE — Therapy (Signed)
[facility] [address] [place], [place], [address] Phone: [phone]   Fax:  [phone]  Physical Therapy Treatment  Patient Details  Name: Doris Wilkerson MRN: [id number] Date of Birth: Aug 14, 1946 Referring Provider (PT): Grant Fontana, MD   Encounter Date: 11/25/2018  PT End of Session - 11/25/18 0930    Visit Number  8    Date for PT Re-Evaluation  12/18/17    Authorization Type  Medicare A and B    PT Start Time  0851    PT Stop Time  0935    PT Time Calculation (min)  44 min    Activity Tolerance  No increased pain;Patient tolerated treatment well    Behavior During Therapy  [facility] for tasks assessed/performed       Past Medical History:  Diagnosis Date  . Asthma   . Cataract   . GERD (gastroesophageal reflux disease)   . Hyperlipidemia   . Hypertension   . Thyroid disease     Past Surgical History:  Procedure Laterality Date  . Cataract Surgery    . CORONARY ANGIOPLASTY WITH STENT PLACEMENT    . EYE SURGERY    . ORTHOPEDIC SURGERY  2012  . TONSILLECTOMY  1951    There were no vitals filed for this visit.  Subjective Assessment - 11/25/18 0853    Subjective  Pt reports that she was sore following her last session for about 24 hours. She thinks that her weights are not heavy enough at home.    Currently in Pain?  Yes   just minimal soreness posterior shoulder Rt side                      [facility] Adult PT Treatment/Exercise - 11/25/18 0001      Shoulder Exercises: Supine   Diagonals  Both;Strengthening;10 reps    Theraband Level (Shoulder Diagonals)  Level 2 (Red)      Shoulder Exercises: Prone   Other Prone Exercises  BUE W's x10 reps, x5 reps with overhead reach from W position      Shoulder Exercises: Standing   External Rotation  Right;10 reps    External Rotation Limitations  red TB, towel underneath arm    Internal Rotation  Right;10 reps    Internal Rotation Limitations   red TB, towel underneath arm      Shoulder Exercises: ROM/Strengthening   Pushups Limitations  12 reps on countertop     Other ROM/Strengthening Exercises  hand plank on wall with 3 way shoulder reach x5 reps on Rt with green TB x5 reps with red TB      Modalities   Modalities  Moist Heat      Moist Heat Therapy   Number Minutes Moist Heat  5 Minutes    Moist Heat Location  Shoulder   Rt     Neck Exercises: Stretches   Other Neck Stretches  Rt shoulder stretch across body x30 sec; Rt shoulder threading the needle x10 reps              PT Education - 11/25/18 0929    Education Details  technique with therex; updated HEP    Person(s) Educated  Patient    Methods  Explanation;Verbal cues;Handout;Demonstration    Comprehension  Verbalized understanding;Returned demonstration       PT Short Term Goals - 11/16/18 0816      PT SHORT TERM GOAL #1   Title  Pt will  demo consistency and independence with HEP to improve strength and flexibility.     Time  3    Period  Weeks    Status  Achieved      PT SHORT TERM GOAL #2   Title  Pt will have atleast 160 deg of active shoulder flexion and abduction which will assist with reaching over head into her cabinets.     Baseline  flexion 160 deg, abduction 150 deg    Time  3    Period  Weeks    Status  Partially Met        PT Long Term Goals - 11/22/18 0950      PT LONG TERM GOAL #1   Title  Pt will have improved Rt shoulder strength to 4/5 MMT which will assist with lifting objects into her cabinets at home.     Time  8    Period  Weeks    Status  On-going      PT LONG TERM GOAL #2   Title  Pt will be able to stack cones with her RUE into the bottom shelf of the cabinets without increase in pain, x20 reps.    Baseline  Pt able to complete with light dishes, but hesitant to lift milk jug    Time  8    Period  Weeks    Status  Partially Met      PT LONG TERM GOAL #3   Title  Pt will report atleast 60% improvement in her  strength and pain to increase activity participation.     Time  8    Period  Weeks    Status  New      PT LONG TERM GOAL #4   Title  Pt will report being able to sleep through the night atleast 4 days a week without increase in Rt shoulder pain which causes her to wake up.     Time  8    Period  Weeks    Status  New      PT LONG TERM GOAL #5   Title  Pt will demo improved scapula control, evident by her ability to elevate her shoulder without noted scapula medial border winging.     Time  8    Period  Weeks    Status  New            Plan - 11/25/18 0933    Clinical Impression Statement  Pt continues to make good progress with only expected posterior shoulder soreness noted following last sessions exercises. She was able to complete all of her therex and HEP updates during today's visit, with therapist providing some demonstration and minor cuing throughout. Pt would continue to benefit from strength and endurance progressions moving forward, so her PT frequency was extended to 1x/week over the next 2 weeks with plan to discharge with advanced HEP at that time. Pt was agreeable with this and had good understanding of all exercises added to HEP.     Rehab Potential  Good    PT Frequency  2x / week    PT Duration  8 weeks    PT Treatment/Interventions  ADLs/Self Care Home Management;Therapeutic exercise;Electrical Stimulation;Cryotherapy;Iontophoresis 27m/ml Dexamethasone;Moist Heat;Neuromuscular re-education;Patient/family education;Manual techniques;Passive range of motion;Dry needling    PT Next Visit Plan  prone shoulder strength W's; shoulder endurance (ball on wall, ext perturbations) scap strength progression in closed chain    PT Home Exercise Plan  [id number]   Consulted and  Agree with Plan of Care  Patient       Patient will benefit from skilled therapeutic intervention in order to improve the following deficits and impairments:  Decreased activity tolerance, Decreased  strength, Impaired flexibility, Impaired UE functional use, Pain, Postural dysfunction, Improper body mechanics, Decreased range of motion, Increased muscle spasms, Hypomobility  Visit Diagnosis: Muscle weakness (generalized)  Acute pain of right shoulder     Problem List Patient Active Problem List   Diagnosis Date Noted  . Hordeolum externum of left lower eyelid 06/28/2018  . CAD (coronary artery disease) 12/09/2011  . Dyslipidemia 12/09/2011  . Osteopenia 12/09/2011  . Hypothyroid 12/09/2011  . Asthma 12/09/2011    10:14 AM,11/25/18 Sherol Dade PT, DPT [place] [facility] [address], [place], [place], [address] Phone: [phone]   Fax:  [phone]  Name: ARMIE MOREN MRN: [id number] Date of Birth: Jul 17, 1946

## 2018-11-28 ENCOUNTER — Encounter: Payer: Self-pay | Admitting: Family Medicine

## 2018-11-30 ENCOUNTER — Encounter: Payer: Medicare Other | Admitting: Physical Therapy

## 2018-12-02 ENCOUNTER — Ambulatory Visit: Payer: Medicare Other | Admitting: Physical Therapy

## 2018-12-02 DIAGNOSIS — M25511 Pain in right shoulder: Secondary | ICD-10-CM

## 2018-12-02 DIAGNOSIS — M6281 Muscle weakness (generalized): Secondary | ICD-10-CM | POA: Diagnosis not present

## 2018-12-02 NOTE — Therapy (Signed)
[facility] [address] [place], [place], [address] Phone: [phone]   Fax:  [phone]  Physical Therapy Treatment  Patient Details  Name: Doris Wilkerson MRN: [id number] Date of Birth: Jan 04, 1946 Referring Provider (PT): Grant Fontana, MD   Encounter Date: 12/02/2018  PT End of Session - 12/02/18 1508    Visit Number  9    Date for PT Re-Evaluation  12/18/17    Authorization Type  Medicare A and B    PT Start Time  4825    PT Stop Time  1537    PT Time Calculation (min)  51 min    Activity Tolerance  No increased pain;Patient tolerated treatment well    Behavior During Therapy  [facility] for tasks assessed/performed       Past Medical History:  Diagnosis Date  . Asthma   . Cataract   . GERD (gastroesophageal reflux disease)   . Hyperlipidemia   . Hypertension   . Thyroid disease     Past Surgical History:  Procedure Laterality Date  . Cataract Surgery    . CORONARY ANGIOPLASTY WITH STENT PLACEMENT    . EYE SURGERY    . ORTHOPEDIC SURGERY  2012  . TONSILLECTOMY  1951    There were no vitals filed for this visit.  Subjective Assessment - 12/02/18 1456    Subjective  Pt reports that she noticed some increase in Rt shoulder soreness after completing her HEP. She has no pain currently.     Currently in Pain?  No/denies                       [facility] Adult PT Treatment/Exercise - 12/02/18 0001      Shoulder Exercises: Supine   Flexion  Right;Left;10 reps    Flexion Limitations  x2 sets, yellow TB around wrists to activate rotator cuff       Shoulder Exercises: Prone   Other Prone Exercises  Prone W's x10 reps, Prone W's with reach overhead x5 reps (discontinued due to soreness)       Shoulder Exercises: Sidelying   Flexion  Right;10 reps    Flexion Weight (lbs)  2    Flexion Limitations  x2 sets     Other Sidelying Exercises  Rt horizontal abduction 2x10 reps with #2 dumbbell       Shoulder Exercises: Standing   Other Standing Exercises  Bicep curls with red TB x15 reps       Shoulder Exercises: ROM/Strengthening   Ball on Wall  RUE clockwise and counterclockwise x30 sec    Other ROM/Strengthening Exercises  incline plank on hands and mat table x10 reps     Other ROM/Strengthening Exercises  RUE ball on wall with perturbations at elbow 2x30 sec       Moist Heat Therapy   Number Minutes Moist Heat  8 Minutes    Moist Heat Location  Shoulder   Rt            PT Education - 12/02/18 1529    Education Details  technique with therex    Person(s) Educated  Patient    Methods  Explanation;Verbal cues    Comprehension  Verbalized understanding;Returned demonstration       PT Short Term Goals - 12/02/18 1531      PT SHORT TERM GOAL #1   Title  Pt will demo consistency and independence with HEP to improve strength and flexibility.  Time  3    Period  Weeks    Status  Achieved      PT SHORT TERM GOAL #2   Title  Pt will have atleast 160 deg of active shoulder flexion and abduction which will assist with reaching over head into her cabinets.     Baseline  flexion 160 deg, abduction 150 deg    Time  3    Period  Weeks    Status  Partially Met        PT Long Term Goals - 12/02/18 1531      PT LONG TERM GOAL #1   Title  Pt will have improved Rt shoulder strength to 4/5 MMT which will assist with lifting objects into her cabinets at home.     Time  8    Period  Weeks    Status  On-going      PT LONG TERM GOAL #2   Title  Pt will be able to stack cones with her RUE into the bottom shelf of the cabinets without increase in pain, x20 reps.    Baseline  Pt able to complete with light dishes, but hesitant to lift milk jug    Time  8    Period  Weeks    Status  Partially Met      PT LONG TERM GOAL #3   Title  Pt will report atleast 60% improvement in her strength and pain to increase activity participation.     Time  8    Period  Weeks    Status   New      PT LONG TERM GOAL #4   Title  Pt will report being able to sleep through the night atleast 4 days a week without increase in Rt shoulder pain which causes her to wake up.     Time  8    Period  Weeks    Status  New      PT LONG TERM GOAL #5   Title  Pt will demo improved scapula control, evident by her ability to elevate her shoulder without noted scapula medial border winging.     Time  8    Period  Weeks    Status  Achieved            Plan - 12/02/18 1531    Clinical Impression Statement  Pt arrived with concerns over Rt anterior shoulder soreness this past week. Therapist reviewed aspects of her HEP to ensure that she was using proper technique and therapist was able to make some adjustments with shoulder external rotation. Pt was educated on ways to increase her activity at home gradually and without shoulder irritation. Pt was able to complete all of today's exercises without increase in shoulder pain end of session, and therapist was able to adjust sets and reps to avoid pain. Will continue with current POC and follow up on shoulder pain next session after today's adjustments.     Rehab Potential  Good    PT Frequency  2x / week    PT Duration  8 weeks    PT Treatment/Interventions  ADLs/Self Care Home Management;Therapeutic exercise;Electrical Stimulation;Cryotherapy;Iontophoresis 68m/ml Dexamethasone;Moist Heat;Neuromuscular re-education;Patient/family education;Manual techniques;Passive range of motion;Dry needling    PT Next Visit Plan  f/u on shoulder pain after HEP updated; prone shoulder strength W's; shoulder endurance (ball on wall, ext perturbations) scap strength progression in closed chain    PT Home Exercise Plan  [email or web address]   Consulted and  Agree with Plan of Care  Patient       Patient will benefit from skilled therapeutic intervention in order to improve the following deficits and impairments:  Decreased activity tolerance, Decreased strength, Impaired  flexibility, Impaired UE functional use, Pain, Postural dysfunction, Improper body mechanics, Decreased range of motion, Increased muscle spasms, Hypomobility  Visit Diagnosis: Muscle weakness (generalized)  Acute pain of right shoulder     Problem List Patient Active Problem List   Diagnosis Date Noted  . Hordeolum externum of left lower eyelid 06/28/2018  . CAD (coronary artery disease) 12/09/2011  . Dyslipidemia 12/09/2011  . Osteopenia 12/09/2011  . Hypothyroid 12/09/2011  . Asthma 12/09/2011   3:42 PM,12/02/18 Sherol Dade PT, DPT [place] [facility] [address] [place], [place], [address] Phone: [phone]   Fax:  [phone]  Name: Doris Wilkerson MRN: [id number] Date of Birth: 22-Aug-1946

## 2018-12-09 ENCOUNTER — Encounter: Payer: Self-pay | Admitting: Physical Therapy

## 2018-12-09 ENCOUNTER — Ambulatory Visit: Payer: Medicare Other | Admitting: Physical Therapy

## 2018-12-09 DIAGNOSIS — M6281 Muscle weakness (generalized): Secondary | ICD-10-CM | POA: Diagnosis not present

## 2018-12-09 DIAGNOSIS — M25511 Pain in right shoulder: Secondary | ICD-10-CM

## 2018-12-09 NOTE — Patient Instructions (Signed)

## 2018-12-09 NOTE — Therapy (Signed)
[facility] [address] [place], [place], [address] Phone: [phone]   Fax:  [phone]  Physical Therapy Treatment  Patient Details  Name: Doris Wilkerson MRN: [id number] Date of Birth: 1946-06-18 Referring Provider (PT): Grant Fontana, MD   Encounter Date: 12/09/2018  PT End of Session - 12/09/18 1409    Visit Number  10    Date for PT Re-Evaluation  12/18/17    Authorization Type  Medicare A and B    PT Start Time  1400    PT Stop Time  5400    PT Time Calculation (min)  45 min    Activity Tolerance  No increased pain;Patient tolerated treatment well    Behavior During Therapy  [facility] for tasks assessed/performed       Past Medical History:  Diagnosis Date  . Asthma   . Cataract   . GERD (gastroesophageal reflux disease)   . Hyperlipidemia   . Hypertension   . Thyroid disease     Past Surgical History:  Procedure Laterality Date  . Cataract Surgery    . CORONARY ANGIOPLASTY WITH STENT PLACEMENT    . EYE SURGERY    . ORTHOPEDIC SURGERY  2012  . TONSILLECTOMY  1951    There were no vitals filed for this visit.  Subjective Assessment - 12/09/18 1403    Subjective  Pt states that things are going well. She has no pain currently. She feels that she is atleast 90% improved as far as her pain and function is concerned.     Currently in Pain?  No/denies         [facility] Assessment - 12/09/18 0001      Assessment   Medical Diagnosis  Rt biceps tendinitis     Referring Provider (PT)  Grant Fontana, MD    Onset Date/Surgical Date  --   6-8 weeks ago    Hand Dominance  Right    Next MD Visit  none for now    Prior Therapy  has had OPPT previously with good improvements       Precautions   Precautions  None      Balance Screen   Has the patient fallen in the past 6 months  No    Has the patient had a decrease in activity level because of a fear of falling?   No    Is the patient reluctant to leave  their home because of a fear of falling?   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       Cognition   Overall Cognitive Status  Within Functional Limits for tasks assessed      Observation/Other Assessments   Focus on Therapeutic Outcomes (FOTO)   43% limited       Sensation   Additional Comments  denies numbness/tingling       AROM   Overall AROM Comments  Rt reach behind head: painful and T3; Lt pain free dysfunctional; reach behind back Rt inferior border (-) pain    Right Shoulder Extension  --   full   Right Shoulder Flexion  165 Degrees   pain lowering at 90 deg   Right Shoulder ABduction  165 Degrees   pain lowering      PROM   Overall PROM Comments  Rt shoulder IR limited 20 deg in scap plane; all others full  Strength   Right Shoulder Flexion  4/5    Right Shoulder ABduction  4/5   discomfort over Rt anterior shoulder    Right Shoulder Internal Rotation  5/5    Right Shoulder External Rotation  4/5   discomfort with this    Left Shoulder Flexion  4/5    Left Shoulder ABduction  4/5    Left Shoulder Internal Rotation  5/5    Left Shoulder External Rotation  4/5      Palpation   Palpation comment  tenderness along Rt teres, Rt bicep and bicep insertion                   [facility] Adult PT Treatment/Exercise - 12/09/18 0001      Neck Exercises: Machines for Strengthening   UBE (Upper Arm Bike)  L1 x2 min forwards/backwards PT present to discuss progress       Shoulder Exercises: Seated   Protraction  Strengthening;Both;5 reps    Protraction Limitations  cues for technique     External Rotation  Both;10 reps    Theraband Level (Shoulder External Rotation)  Level 1 (Yellow)    External Rotation Limitations  HEP demo     Flexion  AAROM;Strengthening;Both;5 reps    Flexion Limitations  HEP demo       Manual Therapy   Manual therapy comments  STM Rt bicep; posterior Rt shoulder        Trigger Point Dry  Needling - 12/09/18 1438    Consent Given?  Yes    Education Handout Provided  Yes    Muscles Treated Upper Body  --   Rt bicep Rt teres minor/major          PT Education - 12/09/18 1445    Education Details  technique with therex and updated HEP to more seated positions; dry needling implications and info    Person(s) Educated  Patient    Methods  Explanation;Handout;Verbal cues    Comprehension  Verbalized understanding;Returned demonstration       PT Short Term Goals - 12/02/18 1531      PT SHORT TERM GOAL #1   Title  Pt will demo consistency and independence with HEP to improve strength and flexibility.     Time  3    Period  Weeks    Status  Achieved      PT SHORT TERM GOAL #2   Title  Pt will have atleast 160 deg of active shoulder flexion and abduction which will assist with reaching over head into her cabinets.     Baseline  flexion 160 deg, abduction 150 deg    Time  3    Period  Weeks    Status  Partially Met        PT Long Term Goals - 12/02/18 1531      PT LONG TERM GOAL #1   Title  Pt will have improved Rt shoulder strength to 4/5 MMT which will assist with lifting objects into her cabinets at home.     Time  8    Period  Weeks    Status  On-going      PT LONG TERM GOAL #2   Title  Pt will be able to stack cones with her RUE into the bottom shelf of the cabinets without increase in pain, x20 reps.    Baseline  Pt able to complete with light dishes, but hesitant to lift milk jug    Time  8  Period  Weeks    Status  Partially Met      PT LONG TERM GOAL #3   Title  Pt will report atleast 60% improvement in her strength and pain to increase activity participation.     Time  8    Period  Weeks    Status  New      PT LONG TERM GOAL #4   Title  Pt will report being able to sleep through the night atleast 4 days a week without increase in Rt shoulder pain which causes her to wake up.     Time  8    Period  Weeks    Status  New      PT LONG TERM  GOAL #5   Title  Pt will demo improved scapula control, evident by her ability to elevate her shoulder without noted scapula medial border winging.     Time  8    Period  Weeks    Status  Achieved            Plan - 12/09/18 1447    Clinical Impression Statement  Pt continues to make steady progress towards her personal goals. She is now able to dress herself without increase in shoulder pain. She feels atleast 90% improved, although she has been hesitant to lift heavier objects such as milk, etc. with her RUE. Her functional reach behind her head has improved, although it was painful at first attempt. Pt was agreeable to dry needling treatment and this was completed to decrease trigger points in the bicep and posterior shoulder musculature. Following this, pt was able to reach behind her head with less pain. Pt has one visit remaining with the plan for d/c assuming no issues/concerns arise.     Rehab Potential  Good    PT Frequency  2x / week    PT Duration  8 weeks    PT Treatment/Interventions  ADLs/Self Care Home Management;Therapeutic exercise;Electrical Stimulation;Cryotherapy;Iontophoresis 21m/ml Dexamethasone;Moist Heat;Neuromuscular re-education;Patient/family education;Manual techniques;Passive range of motion;Dry needling    PT Next Visit Plan  f/u on shoulder pain after HEP updated to sitting and following d/n; prone shoulder strength W's; shoulder endurance (ball on wall, ext perturbations) scap strength progression in closed chain    PT Home Exercise Plan  [email or web address]   Consulted and Agree with Plan of Care  Patient       Patient will benefit from skilled therapeutic intervention in order to improve the following deficits and impairments:  Decreased activity tolerance, Decreased strength, Impaired flexibility, Impaired UE functional use, Pain, Postural dysfunction, Improper body mechanics, Decreased range of motion, Increased muscle spasms, Hypomobility  Visit Diagnosis: Muscle  weakness (generalized)  Acute pain of right shoulder     Problem List Patient Active Problem List   Diagnosis Date Noted  . Hordeolum externum of left lower eyelid 06/28/2018  . CAD (coronary artery disease) 12/09/2011  . Dyslipidemia 12/09/2011  . Osteopenia 12/09/2011  . Hypothyroid 12/09/2011  . Asthma 12/09/2011    2:51 PM,12/09/18 SSherol DadePT, DPT CPowers Lakeat [facility]ashOut[facility] [address] [place] [address]Phone: [phone]  Fax:  [phone] Name: LMADILYNNE MULLANMRN: [id number] of Birth: 805-18-1947

## 2018-12-14 ENCOUNTER — Encounter: Payer: Self-pay | Admitting: Physical Therapy

## 2018-12-14 ENCOUNTER — Ambulatory Visit: Payer: Medicare Other | Admitting: Physical Therapy

## 2018-12-14 DIAGNOSIS — M25511 Pain in right shoulder: Secondary | ICD-10-CM

## 2018-12-14 DIAGNOSIS — M6281 Muscle weakness (generalized): Secondary | ICD-10-CM | POA: Diagnosis not present

## 2018-12-14 NOTE — Patient Instructions (Signed)
Access Code: [email or web address]  URL: [email or web address]  Date: 12/14/2018  Prepared by: Sherol Dade   Exercises  Sidelying Shoulder ER with Towel and Dumbbell - 10-15 reps - 2 sets - 1x daily - 5x weekly  Standing shoulder flexion wall slides - 10 reps - 3 sets - 1x daily - 7x weekly  Doorway Pec Stretch at 90 Degrees Abduction - 3 reps - 20 hold - 1x daily - 4x weekly  Squatting Shoulder Row with Anchored Resistance - 15 reps - 2 sets - 1x daily - 7x weekly  Push Up on Table - 5-10 reps - 2 sets - 1x daily - 5x weekly  Quadruped Scapular Protraction and Retraction - 5-10 reps - 2 sets - 1x daily - 5x weekly  Shoulder External Rotation with Anchored Resistance - 10 reps - 1-2 sets - 1x daily - 5x weekly  Seated Shoulder Diagonal with Resistance - 10 reps - 2 sets - 1x daily - 5x weekly  Seated Shoulder Flexion with Dumbbells - 15-20 reps - 2 sets - 1x daily - 5x weekly  Sidelying Thoracic Lumbar Rotation - 10-15 reps - 1x daily - 5x weekly    [facility] [address] [place], [place] [address] Phone # [phone] Fax [phone]

## 2018-12-14 NOTE — Therapy (Addendum)
[facility] [address] [place], [place], [address] Phone: [phone]   Fax:  [phone]  Physical Therapy Treatment/Discharge  Patient Details  Name: Doris Wilkerson MRN: [id number] Date of Birth: May 30, 1946 Referring Provider (PT): Grant Fontana, MD   Encounter Date: 12/14/2018  PT End of Session - 12/14/18 1541    Visit Number  11    Date for PT Re-Evaluation  12/18/17    Authorization Type  Medicare A and B    PT Start Time  0093    PT Stop Time  1609    PT Time Calculation (min)  39 min    Activity Tolerance  No increased pain;Patient tolerated treatment well    Behavior During Therapy  [facility] for tasks assessed/performed       Past Medical History:  Diagnosis Date  . Asthma   . Cataract   . GERD (gastroesophageal reflux disease)   . Hyperlipidemia   . Hypertension   . Thyroid disease     Past Surgical History:  Procedure Laterality Date  . Cataract Surgery    . CORONARY ANGIOPLASTY WITH STENT PLACEMENT    . EYE SURGERY    . ORTHOPEDIC SURGERY  2012  . TONSILLECTOMY  1951    There were no vitals filed for this visit.  Subjective Assessment - 12/14/18 1533    Subjective  Pt states that things are going well. She was sore for a day following her HEP, but on Friday she felt much better.     Currently in Pain?  No/denies         [facility] PT Assessment - 12/14/18 0001      Assessment   Medical Diagnosis  Rt biceps tendinitis     Referring Provider (PT)  Grant Fontana, MD    Onset Date/Surgical Date  --   6-8 weeks ago    Hand Dominance  Right    Next MD Visit  none for now    Prior Therapy  has had OPPT previously with good improvements       Precautions   Precautions  None      Balance Screen   Has the patient fallen in the past 6 months  No    Has the patient had a decrease in activity level because of a fear of falling?   No    Is the patient reluctant to leave their home because of a  fear of falling?   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       Cognition   Overall Cognitive Status  Within Functional Limits for tasks assessed      Observation/Other Assessments   Focus on Therapeutic Outcomes (FOTO)   21% limited       Sensation   Additional Comments  denies numbness/tingling       AROM   Overall AROM Comments  Rt reach behind head: painful and T3; Lt pain free dysfunctional; reach behind back Rt inferior border (-) pain    Right Shoulder Extension  --   full   Right Shoulder Flexion  165 Degrees   pain free   Right Shoulder ABduction  165 Degrees   pain free     PROM   Overall PROM Comments  --      Strength   Right Shoulder Flexion  4/5    Right Shoulder ABduction  4/5   discomfort  over Rt anterior shoulder    Right Shoulder Internal Rotation  5/5    Right Shoulder External Rotation  4/5   mild discomfort with this   Left Shoulder Flexion  4/5    Left Shoulder ABduction  4/5    Left Shoulder Internal Rotation  5/5    Left Shoulder External Rotation  4/5      Palpation   Palpation comment  pt denies tenderness with palpation to Rt shoulder                    [facility] Adult PT Treatment/Exercise - 12/14/18 0001      Shoulder Exercises: Seated   Other Seated Exercises  BUE flexion with 2# dumbbells x10 reps       Shoulder Exercises: Sidelying   Other Sidelying Exercises  thoracic rotation with red TB x10 reps each direction      Shoulder Exercises: Standing   Flexion  Both;10 reps    Flexion Limitations  UE protraction into wall, yellow TB around wrist      Shoulder Exercises: ROM/Strengthening   Other ROM/Strengthening Exercises  push up plus on mat table x8 reps             PT Education - 12/14/18 1541    Education Details  updated and reviewed HEP;    Person(s) Educated  Patient    Methods  Explanation;Handout;Verbal cues    Comprehension  Verbalized understanding;Returned  demonstration       PT Short Term Goals - 12/14/18 1614      PT SHORT TERM GOAL #1   Title  Pt will demo consistency and independence with HEP to improve strength and flexibility.     Time  3    Period  Weeks    Status  Achieved      PT SHORT TERM GOAL #2   Title  Pt will have atleast 160 deg of active shoulder flexion and abduction which will assist with reaching over head into her cabinets.     Baseline  flexion 160 deg, abduction 160 deg    Time  3    Period  Weeks    Status  Achieved        PT Long Term Goals - 12/14/18 1615      PT LONG TERM GOAL #1   Title  Pt will have improved Rt shoulder strength to 4/5 MMT which will assist with lifting objects into her cabinets at home.     Time  8    Period  Weeks    Status  Achieved      PT LONG TERM GOAL #2   Title  Pt will be able to stack cones with her RUE into the bottom shelf of the cabinets without increase in pain, x20 reps.    Baseline  Pt able to complete with light dishes, but hesitant to lift milk jug    Time  8    Period  Weeks    Status  Unable to assess      PT LONG TERM GOAL #3   Title  Pt will report atleast 60% improvement in her strength and pain to increase activity participation.     Baseline  atleast 75%    Time  8    Period  Weeks    Status  Achieved      PT LONG TERM GOAL #4   Title  Pt will report being able to sleep through the night atleast 4  days a week without increase in Rt shoulder pain which causes her to wake up.     Time  8    Period  Weeks    Status  Partially Met      PT LONG TERM GOAL #5   Title  Pt will demo improved scapula control, evident by her ability to elevate her shoulder without noted scapula medial border winging.     Time  8    Period  Weeks    Status  Achieved            Plan - 12/14/18 1618    Clinical Impression Statement  Pt was discharged this visit having met all of her short term goals and nearly all of her long term goals since beginning PT. She  reports no pain throughout the day and with opening jars or placing things on her shelves, but does not occasional soreness in the Rt shoulder with prolonged sleeping on the Rt side. Pt's active shoulder ROM is within normal limits and her strength is atleast 4/5 MMT or greater. Her scapula control is greatly improved as well, noted by her ability to self-correct position during exercise. She responded well to dry needling treatment at her last session with reports of no pain throughout her day since then. She has been consistently completing her HEP and is agreeable with d/c to allow her to continue working on her HEP for further gains in strength and endurance. Pt's HEP was reviewed and she demonstrated good understanding.     Rehab Potential  Good    PT Frequency  2x / week    PT Duration  8 weeks    PT Treatment/Interventions  ADLs/Self Care Home Management;Therapeutic exercise;Electrical Stimulation;Cryotherapy;Iontophoresis 23m/ml Dexamethasone;Moist Heat;Neuromuscular re-education;Patient/family education;Manual techniques;Passive range of motion;Dry needling    PT Next Visit Plan  d/c with HEP    PT Home Exercise Plan  [email or web address]   Consulted and Agree with Plan of Care  Patient       Patient will benefit from skilled therapeutic intervention in order to improve the following deficits and impairments:  Decreased activity tolerance, Decreased strength, Impaired flexibility, Impaired UE functional use, Pain, Postural dysfunction, Improper body mechanics, Decreased range of motion, Increased muscle spasms, Hypomobility  Visit Diagnosis: Muscle weakness (generalized)  Acute pain of right shoulder     Problem List Patient Active Problem List   Diagnosis Date Noted  . Hordeolum externum of left lower eyelid 06/28/2018  . CAD (coronary artery disease) 12/09/2011  . Dyslipidemia 12/09/2011  . Osteopenia 12/09/2011  . Hypothyroid 12/09/2011  . Asthma 12/09/2011    4:23 PM,12/14/18 SSherol DadePT, DPT CDos Palosat BCharltonOut[facility] [address] [place] [address]Phone: [phone]  Fax:  [phone] Name: Doris STALKERMRN: [id number] of Birth: 8Oct 14, 1947 *addendum to include d/c smart phrase  PHYSICAL THERAPY DISCHARGE SUMMARY  Visits from Start of Care: 11  Current functional level related to goals / functional outcomes: See above for more details    Remaining deficits: See above for more details    Education / Equipment: See above for more details   Plan: Patient agrees to discharge.  Patient goals were met. Patient is being discharged due to being pleased with the current functional level.  ?????     3:14 PM,01/04/19 SSherol DadePT, DFoardat BCoosa

## 2018-12-21 ENCOUNTER — Other Ambulatory Visit: Payer: Self-pay | Admitting: Family Medicine

## 2018-12-21 DIAGNOSIS — E782 Mixed hyperlipidemia: Secondary | ICD-10-CM

## 2018-12-21 NOTE — Telephone Encounter (Signed)
Requested Prescriptions  Pending Prescriptions Disp Refills  . atorvastatin (LIPITOR) 80 MG tablet [Pharmacy Med Name: ATORVASTATIN 80MG  TABLETS] 90 tablet 0    Sig: TAKE 1 TABLET(80 MG) BY MOUTH DAILY     Cardiovascular:  Antilipid - Statins Passed - 12/21/2018 11:08 AM      Passed - Total Cholesterol in normal range and within 360 days    Cholesterol, Total  Date Value Ref Range Status  11/19/2018 179 100 - 199 mg/dL Final         Passed - LDL in normal range and within 360 days    LDL Calculated  Date Value Ref Range Status  11/19/2018 87 0 - 99 mg/dL Final         Passed - HDL in normal range and within 360 days    HDL  Date Value Ref Range Status  11/19/2018 79 >39 mg/dL Final         Passed - Triglycerides in normal range and within 360 days    Triglycerides  Date Value Ref Range Status  11/19/2018 67 0 - 149 mg/dL Final         Passed - Patient is not pregnant      Passed - Valid encounter within last 12 months    Recent Outpatient Visits          2 months ago Vaginal atrophy   [facility], Lilia Argue, MD   5 months ago Hordeolum externum of left lower eyelid   [facility] at [facility], Ines Bloomer, MD   8 months ago Dysuria   [facility], Ranell Patrick, MD   9 months ago Essential hypertension   [facility], Ranell Patrick, MD   1 year ago Essential hypertension   [facility] at [facility] [place], [place], [place]

## 2018-12-22 ENCOUNTER — Other Ambulatory Visit: Payer: Self-pay | Admitting: Family Medicine

## 2018-12-22 DIAGNOSIS — E039 Hypothyroidism, unspecified: Secondary | ICD-10-CM

## 2018-12-22 DIAGNOSIS — E782 Mixed hyperlipidemia: Secondary | ICD-10-CM

## 2018-12-23 DIAGNOSIS — I10 Essential (primary) hypertension: Secondary | ICD-10-CM | POA: Diagnosis not present

## 2018-12-23 DIAGNOSIS — I2510 Atherosclerotic heart disease of native coronary artery without angina pectoris: Secondary | ICD-10-CM | POA: Diagnosis not present

## 2018-12-23 DIAGNOSIS — E7800 Pure hypercholesterolemia, unspecified: Secondary | ICD-10-CM | POA: Diagnosis not present

## 2018-12-23 DIAGNOSIS — Z0189 Encounter for other specified special examinations: Secondary | ICD-10-CM | POA: Diagnosis not present

## 2019-02-21 ENCOUNTER — Telehealth: Payer: Self-pay | Admitting: *Deleted

## 2019-02-21 NOTE — Telephone Encounter (Signed)
SCHEDULE AWV 

## 2019-03-03 ENCOUNTER — Other Ambulatory Visit: Payer: Self-pay | Admitting: Family Medicine

## 2019-03-03 DIAGNOSIS — I10 Essential (primary) hypertension: Secondary | ICD-10-CM

## 2019-03-03 DIAGNOSIS — E782 Mixed hyperlipidemia: Secondary | ICD-10-CM

## 2019-03-04 ENCOUNTER — Other Ambulatory Visit: Payer: Self-pay | Admitting: Family Medicine

## 2019-03-04 DIAGNOSIS — E782 Mixed hyperlipidemia: Secondary | ICD-10-CM

## 2019-03-04 MED ORDER — ATORVASTATIN CALCIUM 80 MG PO TABS: 80.0000 mg | ORAL_TABLET | Freq: Every day | ORAL | 0 refills | Status: DC

## 2019-04-01 ENCOUNTER — Other Ambulatory Visit: Payer: Self-pay | Admitting: Family Medicine

## 2019-04-01 DIAGNOSIS — I10 Essential (primary) hypertension: Secondary | ICD-10-CM

## 2019-04-01 DIAGNOSIS — E782 Mixed hyperlipidemia: Secondary | ICD-10-CM

## 2019-05-01 ENCOUNTER — Other Ambulatory Visit: Payer: Self-pay | Admitting: Family Medicine

## 2019-05-01 DIAGNOSIS — E782 Mixed hyperlipidemia: Secondary | ICD-10-CM

## 2019-05-01 DIAGNOSIS — I10 Essential (primary) hypertension: Secondary | ICD-10-CM

## 2019-05-01 NOTE — Telephone Encounter (Signed)
2 courtesy refills have already been given- pt needs appt for virtual visit

## 2019-05-02 NOTE — Telephone Encounter (Signed)
Denied lotensin 10 mg.  Last ov 03/15/2018 advised to return in 6 mos(09/14/2018) and did not.  2 courtesy refills have already been given.  Last labs drawn 11/19/2018 comp abnormal, lipid, tsh and cbc normal.  Pt needs office visit and lab draws. No upcoming ov or lab visit schedule.  Will have scheduler to call pt and schedule appt with green for f/u htn. Dgaddy, CMA

## 2019-05-03 ENCOUNTER — Other Ambulatory Visit: Payer: Self-pay | Admitting: Family Medicine

## 2019-05-03 DIAGNOSIS — I10 Essential (primary) hypertension: Secondary | ICD-10-CM

## 2019-05-03 DIAGNOSIS — E782 Mixed hyperlipidemia: Secondary | ICD-10-CM

## 2019-05-04 ENCOUNTER — Encounter: Payer: Self-pay | Admitting: Family Medicine

## 2019-05-05 ENCOUNTER — Other Ambulatory Visit: Payer: Self-pay

## 2019-05-05 ENCOUNTER — Other Ambulatory Visit: Payer: Self-pay | Admitting: Family Medicine

## 2019-05-05 DIAGNOSIS — I10 Essential (primary) hypertension: Secondary | ICD-10-CM

## 2019-05-05 DIAGNOSIS — E782 Mixed hyperlipidemia: Secondary | ICD-10-CM

## 2019-05-05 MED ORDER — BENAZEPRIL HCL 10 MG PO TABS: ORAL_TABLET | ORAL | 0 refills | Status: DC

## 2019-05-23 ENCOUNTER — Telehealth: Payer: Self-pay | Admitting: Family Medicine

## 2019-05-23 ENCOUNTER — Telehealth (INDEPENDENT_AMBULATORY_CARE_PROVIDER_SITE_OTHER): Payer: Medicare Other | Admitting: Family Medicine

## 2019-05-23 DIAGNOSIS — E039 Hypothyroidism, unspecified: Secondary | ICD-10-CM | POA: Diagnosis not present

## 2019-05-23 DIAGNOSIS — I10 Essential (primary) hypertension: Secondary | ICD-10-CM | POA: Diagnosis not present

## 2019-05-23 DIAGNOSIS — E782 Mixed hyperlipidemia: Secondary | ICD-10-CM | POA: Diagnosis not present

## 2019-05-23 MED ORDER — LEVOTHYROXINE SODIUM 25 MCG PO TABS: ORAL_TABLET | ORAL | 3 refills | Status: DC

## 2019-05-23 MED ORDER — BENAZEPRIL HCL 10 MG PO TABS: ORAL_TABLET | ORAL | 3 refills | Status: DC

## 2019-05-23 MED ORDER — ATORVASTATIN CALCIUM 80 MG PO TABS: 80.0000 mg | ORAL_TABLET | Freq: Every day | ORAL | 3 refills | Status: DC

## 2019-05-23 NOTE — Progress Notes (Signed)
CC: Patient states she would like a refill on lipitor and benazepril. Pt was tested foR COVID-19 last week and are a waiting the results.

## 2019-05-23 NOTE — Patient Instructions (Signed)
     No medication changes.  Will determine if anything different is needed after upcoming blood work.  Follow-up in approximately 6 months for physical.  Let me know if there are questions in the meantime and stay safe.    If you have lab work done today you will be contacted with your lab results within the next 2 weeks.  If you have not heard from [place] then please contact us. The fastest way to get your results is to register for My Chart.   IF you received an x-ray today, you will receive an invoice from [facility]. Please contact [facility] at [phone] with questions or concerns regarding your invoice.   IF you received labwork today, you will receive an invoice from [facility]. Please contact [facility] at [phone] with questions or concerns regarding your invoice.   Our billing staff will not be able to assist you with questions regarding bills from these companies.  You will be contacted with the lab results as soon as they are available. The fastest way to get your results is to activate your My Chart account. Instructions are located on the last page of this paperwork. If you have not heard from [place] regarding the results in 2 weeks, please contact this office.

## 2019-05-23 NOTE — Progress Notes (Signed)
Virtual Visit via Video Note  I connected with Doris Wilkerson on 05/23/19 at 11:53 AM by a video enabled telemedicine application [email or web address] and verified that I am speaking with the correct person using two identifiers.   I discussed the limitations, risks, security and privacy concerns of performing an evaluation and management service by telephone and the availability of in person appointments. I also discussed with the patient that there may be a patient responsible charge related to this service. The patient expressed understanding and agreed to proceed, consent obtained  Chief complaint: Htn, HLD.    History of Present Illness: Doris Wilkerson is a 73 y.o. female   Did not feel well last week. Sinus headache.  Feels fine now. Better over the weekend. Had taken otc allergy meds prior and ran out of usual meds, used med that had given dog in past- antihistamine. Lessened to 1/2 pill and seemed to feel better. Had Covid testing late last week to be sure. Has not yet received results.  Denies depression at this time.  Depression screen [facility] 2/9 05/23/2019 05/23/2019 10/08/2018 06/28/2018 04/09/2018  Decreased Interest 0 0 0 0 0  Down, Depressed, Hopeless 1 1 0 0 0  PHQ - 2 Score 1 1 0 0 0     Hypertension:  BP Readings from Last 3 Encounters:  05/23/19 129/71  10/08/18 122/78  06/28/18 134/66   Lab Results  Component Value Date   CREATININE 0.73 11/19/2018  history of CAD, stent in 2204 to LAD. appt with cardiology in January, Dr. Einar Gip. No changes.  Benazepril 10mg  qd, ASA 81 mg qd, on statin as well. Asymptomatic sinus bradycardia.  Constitutional: Negative for fatigue and unexpected weight change.  Eyes: Negative for visual disturbance.  Respiratory: Negative for cough, chest tightness and shortness of breath.   Cardiovascular: Negative for chest pain, palpitations and leg swelling.  Gastrointestinal: Negative for abdominal pain and blood in stool.  Neurological: Negative for dizziness,  light-headedness and headaches.    Home readings: 120's/50-70. HR 60-80.  No new side effects of meds   Hyperlipidemia:  Lab Results  Component Value Date   CHOL 179 11/19/2018   HDL 79 11/19/2018   LDLCALC 87 11/19/2018   TRIG 67 11/19/2018   CHOLHDL 2.3 11/19/2018   Lab Results  Component Value Date   ALT 21 11/19/2018   AST 22 11/19/2018   ALKPHOS 73 11/19/2018   BILITOT 0.5 11/19/2018  Lipitor 80mg  qd.  No new myalgias or side effects.    Hypothyroidism: Lab Results  Component Value Date   TSH 2.430 11/19/2018  synthroid 60mcg per day No new hair/skin changes/temp intolerance, no palpitations.     Patient Active Problem List   Diagnosis Date Noted  . Hordeolum externum of left lower eyelid 06/28/2018  . CAD (coronary artery disease) 12/09/2011  . Dyslipidemia 12/09/2011  . Osteopenia 12/09/2011  . Hypothyroid 12/09/2011  . Asthma 12/09/2011   Past Medical History:  Diagnosis Date  . Asthma   . Cataract   . GERD (gastroesophageal reflux disease)   . Hyperlipidemia   . Hypertension   . Thyroid disease    Past Surgical History:  Procedure Laterality Date  . Cataract Surgery    . CORONARY ANGIOPLASTY WITH STENT PLACEMENT    . EYE SURGERY    . ORTHOPEDIC SURGERY  2012  . TONSILLECTOMY  1951   Allergies  Allergen Reactions  . Plavix [Clopidogrel Bisulfate]    Prior to Admission medications   Medication Sig  Start Date End Date Taking? Authorizing Provider  aspirin 81 MG tablet Take 81 mg by mouth daily.    Yes Hayden Rasmussen, MD  atorvastatin (LIPITOR) 80 MG tablet Take 1 tablet (80 mg total) by mouth daily at 6 PM. 03/04/19  Yes [name], MD  benazepril (LOTENSIN) 10 MG tablet TAKE 1 TABLET(10 MG) BY MOUTH DAILY 05/05/19  Yes [name], MD  conjugated estrogens (PREMARIN) vaginal cream Place 0.5 Applicatorfuls vaginally 2 (two) times a week. 10/11/18  Yes Rutherford Guys, MD  diphenhydrAMINE (ALLERGY) 25 mg capsule Take 25 mg by  mouth every 6 (six) hours as needed.   Yes [provider]  levothyroxine (SYNTHROID, LEVOTHROID) 25 MCG tablet TAKE 1 TABLET(25 MCG) BY MOUTH DAILY BEFORE BREAKFAST 09/14/18  Yes [name], MD  OVER THE COUNTER MEDICATION Vitamin D 2000iu daily.   Yes [provider]   Social History   Socioeconomic History  . Marital status: Married    Spouse name: Not on file  . Number of children: Not on file  . Years of education: Not on file  . Highest education level: Not on file  Occupational History  . Occupation: unemployed  Social Needs  . Financial resource strain: Not on file  . Food insecurity    Worry: Not on file    Inability: Not on file  . Transportation needs    Medical: Not on file    Non-medical: Not on file  Tobacco Use  . Smoking status: Never Smoker  . Smokeless tobacco: Never Used  Substance and Sexual Activity  . Alcohol use: Yes    Alcohol/week: 2.0 standard drinks    Types: 2 Standard drinks or equivalent per week    Comment: occassionally  . Drug use: No  . Sexual activity: Not on file  Lifestyle  . Physical activity    Days per week: Not on file    Minutes per session: Not on file  . Stress: Not on file  Relationships  . Social Herbalist on phone: Not on file    Gets together: Not on file    Attends religious service: Not on file    Active member of club or organization: Not on file    Attends meetings of clubs or organizations: Not on file    Relationship status: Not on file  . Intimate partner violence    Fear of current or ex partner: Not on file    Emotionally abused: Not on file    Physically abused: Not on file    Forced sexual activity: Not on file  Other Topics Concern  . Not on file  Social History Narrative   Married. Education: college. Pt does exercise-    Observations/Objective: No distress.  Appropriate responses, euthymic mood.  Understanding expressed of plan and all questions  answered  Assessment and Plan: Mixed hyperlipidemia - Plan: atorvastatin (LIPITOR) 80 MG tablet, Hyperlipemia, mixed - Plan: Comprehensive metabolic panel, Lipid panel, benazepril (LOTENSIN) 10 MG tablet, atorvastatin (LIPITOR) 80 MG tablet,   -Tolerating control, continue same dose, future lab visit pending  Essential hypertension - Plan: Comprehensive metabolic panel, benazepril (LOTENSIN) 10 MG tablet,   -Controlled on home readings, no new side effects of meds.  Continue same labs planned  Hypothyroidism, unspecified type - Plan: TSH, levothyroxine (SYNTHROID) 25 MCG tablet,   -No new symptoms.  Check TSH, continue same dose Synthroid  Some stress last week with symptoms and concern for COVID,  feels better at this time.  Denies depression symptoms at present.  RTC precautions discussed and anticipatory guidance given regarding stress management during this time    Follow Up Instructions: 6 months for med review/physical  Patient Instructions       No medication changes.  Will determine if anything different is needed after upcoming blood work.  Follow-up in approximately 6 months for physical.  Let me know if there are questions in the meantime and stay safe.    If you have lab work done today you will be contacted with your lab results within the next 2 weeks.  If you have not heard from [place] then please contact us. The fastest way to get your results is to register for My Chart.   IF you received an x-ray today, you will receive an invoice from [facility]. Please contact [facility] at [phone] with questions or concerns regarding your invoice.   IF you received labwork today, you will receive an invoice from [facility]. Please contact [facility] at [phone] with questions or concerns regarding your invoice.   Our billing staff will not be able to assist you with questions regarding bills from these companies.  You will be contacted with the lab results  as soon as they are available. The fastest way to get your results is to activate your My Chart account. Instructions are located on the last page of this paperwork. If you have not heard from [place] regarding the results in 2 weeks, please contact this office.          I discussed the assessment and treatment plan with the patient. The patient was provided an opportunity to ask questions and all were answered. The patient agreed with the plan and demonstrated an understanding of the instructions.   The patient was advised to call back or seek an in-person evaluation if the symptoms worsen or if the condition fails to improve as anticipated.  I provided 17 minutes of non-face-to-face time during this encounter.   [name], MD

## 2019-05-23 NOTE — Telephone Encounter (Signed)
Called pt to schedule lab visit 10 days from 05/23/2019 and a 6 month f/u for med review and cpe. [email or web address]

## 2019-06-02 ENCOUNTER — Ambulatory Visit: Payer: Medicare Other

## 2019-06-03 ENCOUNTER — Ambulatory Visit: Payer: Medicare Other

## 2019-06-06 ENCOUNTER — Other Ambulatory Visit: Payer: Self-pay

## 2019-06-06 ENCOUNTER — Ambulatory Visit (INDEPENDENT_AMBULATORY_CARE_PROVIDER_SITE_OTHER): Payer: Medicare Other | Admitting: Family Medicine

## 2019-06-06 DIAGNOSIS — E782 Mixed hyperlipidemia: Secondary | ICD-10-CM

## 2019-06-06 DIAGNOSIS — I10 Essential (primary) hypertension: Secondary | ICD-10-CM | POA: Diagnosis not present

## 2019-06-06 DIAGNOSIS — E039 Hypothyroidism, unspecified: Secondary | ICD-10-CM | POA: Diagnosis not present

## 2019-06-07 LAB — COMPREHENSIVE METABOLIC PANEL
ALT: 21 IU/L (ref 0–32)
AST: 22 IU/L (ref 0–40)
Albumin/Globulin Ratio: 2.8 — ABNORMAL HIGH (ref 1.2–2.2)
Albumin: 4.8 g/dL — ABNORMAL HIGH (ref 3.7–4.7)
Alkaline Phosphatase: 74 IU/L (ref 39–117)
BUN/Creatinine Ratio: 19 (ref 12–28)
BUN: 16 mg/dL (ref 8–27)
Bilirubin Total: 0.6 mg/dL (ref 0.0–1.2)
CO2: 22 mmol/L (ref 20–29)
Calcium: 10.5 mg/dL — ABNORMAL HIGH (ref 8.7–10.3)
Chloride: 105 mmol/L (ref 96–106)
Creatinine, Ser: 0.83 mg/dL (ref 0.57–1.00)
GFR calc Af Amer: 81 mL/min/{1.73_m2} (ref >59)
GFR calc non Af Amer: 71 mL/min/{1.73_m2} (ref >59)
Globulin, Total: 1.7 g/dL (ref 1.5–4.5)
Glucose: 93 mg/dL (ref 65–99)
Potassium: 4.6 mmol/L (ref 3.5–5.2)
Sodium: 140 mmol/L (ref 134–144)
Total Protein: 6.5 g/dL (ref 6.0–8.5)

## 2019-06-07 LAB — LIPID PANEL
Chol/HDL Ratio: 2.5 ratio (ref 0.0–4.4)
Cholesterol, Total: 180 mg/dL (ref 100–199)
HDL: 71 mg/dL (ref >39)
LDL Calculated: 96 mg/dL (ref 0–99)
Triglycerides: 67 mg/dL (ref 0–149)
VLDL Cholesterol Cal: 13 mg/dL (ref 5–40)

## 2019-06-07 LAB — TSH: TSH: 2.74 u[IU]/mL (ref 0.450–4.500)

## 2019-07-04 ENCOUNTER — Telehealth: Payer: Self-pay | Admitting: *Deleted

## 2019-07-04 NOTE — Telephone Encounter (Signed)
Schedule AWV.  

## 2019-07-21 DIAGNOSIS — Z23 Encounter for immunization: Secondary | ICD-10-CM | POA: Diagnosis not present

## 2019-09-30 DIAGNOSIS — M25561 Pain in right knee: Secondary | ICD-10-CM | POA: Diagnosis not present

## 2019-10-07 DIAGNOSIS — M25561 Pain in right knee: Secondary | ICD-10-CM | POA: Diagnosis not present

## 2019-10-12 DIAGNOSIS — S83249A Other tear of medial meniscus, current injury, unspecified knee, initial encounter: Secondary | ICD-10-CM | POA: Insufficient documentation

## 2019-10-12 DIAGNOSIS — S83231A Complex tear of medial meniscus, current injury, right knee, initial encounter: Secondary | ICD-10-CM | POA: Diagnosis not present

## 2019-10-12 DIAGNOSIS — M25561 Pain in right knee: Secondary | ICD-10-CM | POA: Diagnosis not present

## 2019-10-28 DIAGNOSIS — M25561 Pain in right knee: Secondary | ICD-10-CM | POA: Diagnosis not present

## 2019-10-31 DIAGNOSIS — M25561 Pain in right knee: Secondary | ICD-10-CM | POA: Diagnosis not present

## 2019-11-04 DIAGNOSIS — M25561 Pain in right knee: Secondary | ICD-10-CM | POA: Diagnosis not present

## 2019-11-07 DIAGNOSIS — M25561 Pain in right knee: Secondary | ICD-10-CM | POA: Diagnosis not present

## 2019-11-21 ENCOUNTER — Ambulatory Visit (INDEPENDENT_AMBULATORY_CARE_PROVIDER_SITE_OTHER): Payer: Medicare Other | Admitting: Family Medicine

## 2019-11-21 VITALS — BP 129/71 | Ht 66.5 in | Wt 154.0 lb

## 2019-11-21 DIAGNOSIS — Z0000 Encounter for general adult medical examination without abnormal findings: Secondary | ICD-10-CM

## 2019-11-21 NOTE — Progress Notes (Signed)
Presents today for [employer] Visit   Date of last exam:  06-06-2019  Interpreter used for this visit? No  I connected with  Doris Wilkerson on 11/21/19 by a telephone and verified that I am speaking with the correct person using two identifiers.   I discussed the limitations of evaluation and management by telemedicine. The patient expressed understanding and agreed to proceed.    Patient Care Team: Doris Agreste, MD as PCP - General (Family Medicine) Doris Prows, MD as Consulting Physician (Cardiology) Doris Breath., MD (Ophthalmology)   Other items to address today:   Discussed Eye/Dental Discussed immunizations Will call to schedule Mammogram Follow up scheduled 11-29-2018 Doris Wilkerson   Other Screening: Last lipid screening: 06/06/2019  ADVANCE DIRECTIVES: Discussed: yes On File: yes Materials Provided:no  Immunization status:  Immunization History  Administered Date(s) Administered  . Hepatitis A 07/14/2006, 04/02/2007  . IPV 07/14/2006  . Influenza, Seasonal, Injecte, Preservative Fre 12/13/2012  . Influenza,inj,Quad PF,6+ Mos 09/12/2013, 10/16/2014, 09/24/2015, 08/25/2016, 09/16/2017, 10/08/2018  . Pneumococcal Conjugate-13 05/14/2015  . Pneumococcal Polysaccharide-23 10/15/2013  . Tdap 07/14/2006  . Typhoid Parenteral 07/14/2006  . Zoster 11/23/2008     Health Maintenance Due  Topic Date Due  . Doris Wilkerson  11/24/2017     Functional Status Survey: Is the patient deaf or have difficulty hearing?: No Does the patient have difficulty seeing, even when wearing glasses/contacts?: No Does the patient have difficulty concentrating, remembering, or making decisions?: No Does the patient have difficulty walking or climbing stairs?: Yes(going to physical therapy for torn men) Does the patient have difficulty dressing or bathing?: No Does the patient have difficulty doing errands alone such as visiting a doctor's office or shopping?:  No   6CIT Screen 11/21/2019 09/07/2017  What Year? 0 points 0 points  What month? 0 points 0 points  What time? 0 points 0 points  Count back from 20 0 points 0 points  Months in reverse 0 points 0 points  Repeat phrase 0 points 0 points  Total Score 0 0        Clinical Support from 11/21/2019 [facility]  AUDIT-C Score  0       Home Environment:   In PT for R Knee  Using a cane (as a precaution) Has trouble climbing stairs  Lives in a one story home Yes scattered rugs (non slip) Yes grab bars shower seat Has a walk in tub/shower Adequate lighting/ no clutter  Has a helper come in during the week with housekeeping Takes precautions for COVID   Patient Active Problem List   Diagnosis Date Noted  . Hordeolum externum of left lower eyelid 06/28/2018  . CAD (coronary artery disease) 12/09/2011  . Dyslipidemia 12/09/2011  . Osteopenia 12/09/2011  . Hypothyroid 12/09/2011  . Asthma 12/09/2011     Past Medical History:  Diagnosis Date  . Asthma   . Cataract   . GERD (gastroesophageal reflux disease)   . Hyperlipidemia   . Hypertension   . Thyroid disease      Past Surgical History:  Procedure Laterality Date  . Cataract Surgery    . CORONARY ANGIOPLASTY WITH STENT PLACEMENT    . EYE SURGERY    . ORTHOPEDIC SURGERY  2012  . TONSILLECTOMY  1951     Family History  Problem Relation Age of Onset  . Cancer Father   . Heart disease Brother   . Dementia Brother   . Breast  cancer Neg Hx   . Colon cancer Neg Hx   . Esophageal cancer Neg Hx   . Pancreatic cancer Neg Hx   . Rectal cancer Neg Hx   . Stomach cancer Neg Hx      Social History   Socioeconomic History  . Marital status: Married    Spouse name: Not on file  . Number of children: Not on file  . Years of education: Not on file  . Highest education level: Not on file  Occupational History  . Occupation: unemployed  Tobacco Use  . Smoking status: Never Smoker  . Smokeless  tobacco: Never Used  Substance and Sexual Activity  . Alcohol use: Yes    Alcohol/week: 2.0 standard drinks    Types: 2 Standard drinks or equivalent per week    Comment: occassionally  . Drug use: No  . Sexual activity: Not on file  Other Topics Concern  . Not on file  Social History Narrative   Married. Education: college. Pt does exercise-   Social Determinants of Health   Financial Resource Strain:   . Difficulty of Paying Living Expenses: Not on file  Food Insecurity:   . Worried About Charity fundraiser in the Last Year: Not on file  . Ran Out of Food in the Last Year: Not on file  Transportation Needs:   . Lack of Transportation (Medical): Not on file  . Lack of Transportation (Non-Medical): Not on file  Physical Activity:   . Days of Exercise per Week: Not on file  . Minutes of Exercise per Session: Not on file  Stress:   . Feeling of Stress : Not on file  Social Connections:   . Frequency of Communication with Friends and Family: Not on file  . Frequency of Social Gatherings with Friends and Family: Not on file  . Attends Religious Services: Not on file  . Active Member of Clubs or Organizations: Not on file  . Attends Archivist Meetings: Not on file  . Marital Status: Not on file  Intimate Partner Violence:   . Fear of Current or Ex-Partner: Not on file  . Emotionally Abused: Not on file  . Physically Abused: Not on file  . Sexually Abused: Not on file     Allergies  Allergen Reactions  . Plavix [Clopidogrel Bisulfate]      Prior to Admission medications   Medication Sig Start Date End Date Taking? Authorizing Provider  aspirin 81 MG tablet Take 81 mg by mouth daily.    Yes Hayden Rasmussen, MD  atorvastatin (LIPITOR) 80 MG tablet Take 1 tablet (80 mg total) by mouth daily at 6 PM. 05/23/19  Yes Doris Agreste, MD  benazepril (LOTENSIN) 10 MG tablet TAKE 1 TABLET(10 MG) BY MOUTH DAILY 05/23/19  Yes Doris Agreste, MD  diphenhydrAMINE  (ALLERGY) 25 mg capsule Take 25 mg by mouth every 6 (six) hours as needed.   Yes [provider]  levothyroxine (SYNTHROID) 25 MCG tablet TAKE 1 TABLET(25 MCG) BY MOUTH DAILY BEFORE BREAKFAST 05/23/19  Yes Doris Agreste, MD  OVER THE COUNTER MEDICATION Vitamin D 2000iu daily.   Yes [provider]  conjugated estrogens (PREMARIN) vaginal cream Place 0.5 Applicatorfuls vaginally 2 (two) times a week. 10/11/18   Rutherford Guys, MD     Depression screen [facility] 2/9 11/21/2019 05/23/2019 05/23/2019 10/08/2018 06/28/2018  Decreased Interest 0 0 0 0 0  Down, Depressed, Hopeless 0 1 1 0 0  PHQ -  2 Score 0 1 1 0 0     Fall Risk  11/21/2019 05/23/2019 05/23/2019 10/08/2018 06/28/2018  Falls in the past year? 0 0 0 0 No  Number falls in past yr: 0 0 0 - -  Injury with Fall? 0 0 0 - -  Follow up Falls evaluation completed;Education provided - - - -      PHYSICAL EXAM: BP 129/71 Comment: taken from previous  Ht 5' 6.5" (1.689 m) Comment: per patient  Wt 154 lb (69.9 kg)   BMI 24.48 kg/m    Wt Readings from Last 3 Encounters:  11/21/19 154 lb (69.9 kg)  05/23/19 154 lb (69.9 kg)  10/08/18 152 lb 3.2 oz (69 kg)    Medicare annual wellness visit, subsequent   Education/Counseling provided regarding diet and exercise, prevention of chronic diseases, smoking/tobacco cessation, if applicable, and reviewed "Covered Medicare Preventive Services."   .

## 2019-11-21 NOTE — Patient Instructions (Addendum)
Thank you for taking time to come for your Medicare Wellness Visit. I appreciate your ongoing commitment to your health goals. Please review the following plan we discussed and let me know if I can assist you in the future.  Doris Pavlich LPN  Preventive Care 73 Years and Older, Female Preventive care refers to lifestyle choices and visits with your health care provider that can promote health and wellness. This includes:  A yearly physical exam. This is also called an annual well check.  Regular dental and eye exams.  Immunizations.  Screening for certain conditions.  Healthy lifestyle choices, such as diet and exercise. What can I expect for my preventive care visit? Physical exam Your health care provider will check:  Height and weight. These may be used to calculate body mass index (BMI), which is a measurement that tells if you are at a healthy weight.  Heart rate and blood pressure.  Your skin for abnormal spots. Counseling Your health care provider may ask you questions about:  Alcohol, tobacco, and drug use.  Emotional well-being.  Home and relationship well-being.  Sexual activity.  Eating habits.  History of falls.  Memory and ability to understand (cognition).  Work and work environment.  Pregnancy and menstrual history. What immunizations do I need?  Influenza (flu) vaccine  This is recommended every year. Tetanus, diphtheria, and pertussis (Tdap) vaccine  You may need a Td booster every 10 years. Varicella (chickenpox) vaccine  You may need this vaccine if you have not already been vaccinated. Zoster (shingles) vaccine  You may need this after age 60. Pneumococcal conjugate (PCV13) vaccine  One dose is recommended after age 65. Pneumococcal polysaccharide (PPSV23) vaccine  One dose is recommended after age 65. Measles, mumps, and rubella (MMR) vaccine  You may need at least one dose of MMR if you were born in 1957 or later. You may also  need a second dose. Meningococcal conjugate (MenACWY) vaccine  You may need this if you have certain conditions. Hepatitis A vaccine  You may need this if you have certain conditions or if you travel or work in places where you may be exposed to hepatitis A. Hepatitis B vaccine  You may need this if you have certain conditions or if you travel or work in places where you may be exposed to hepatitis B. Haemophilus influenzae type b (Hib) vaccine  You may need this if you have certain conditions. You may receive vaccines as individual doses or as more than one vaccine together in one shot (combination vaccines). Talk with your health care provider about the risks and benefits of combination vaccines. What tests do I need? Blood tests  Lipid and cholesterol levels. These may be checked every 5 years, or more frequently depending on your overall health.  Hepatitis C test.  Hepatitis B test. Screening  Lung cancer screening. You may have this screening every year starting at age 55 if you have a 30-pack-year history of smoking and currently smoke or have quit within the past 15 years.  Colorectal cancer screening. All adults should have this screening starting at age 50 and continuing until age 75. Your health care provider may recommend screening at age 45 if you are at increased risk. You will have tests every 1-10 years, depending on your results and the type of screening test.  Diabetes screening. This is done by checking your blood sugar (glucose) after you have not eaten for a while (fasting). You may have this done every 1-3   years.  Mammogram. This may be done every 1-2 years. Talk with your health care provider about how often you should have regular mammograms.  BRCA-related cancer screening. This may be done if you have a family history of breast, ovarian, tubal, or peritoneal cancers. Other tests  Sexually transmitted disease (STD) testing.  Bone density scan. This is done  to screen for osteoporosis. You may have this done starting at age 6. Follow these instructions at home: Eating and drinking  Eat a diet that includes fresh fruits and vegetables, whole grains, lean protein, and low-fat dairy products. Limit your intake of foods with high amounts of sugar, saturated fats, and salt.  Take vitamin and mineral supplements as recommended by your health care provider.  Do not drink alcohol if your health care provider tells you not to drink.  If you drink alcohol: ? Limit how much you have to 0-1 drink a day. ? Be aware of how much alcohol is in your drink. In the [place] one drink equals one 12 oz bottle of beer (355 mL), one 5 oz glass of wine (148 mL), or one 1 oz glass of hard liquor (44 mL). Lifestyle  Take daily care of your teeth and gums.  Stay active. Exercise for at least 30 minutes on 5 or more days each week.  Do not use any products that contain nicotine or tobacco, such as cigarettes, e-cigarettes, and chewing tobacco. If you need help quitting, ask your health care provider.  If you are sexually active, practice safe sex. Use a condom or other form of protection in order to prevent STIs (sexually transmitted infections).  Talk with your health care provider about taking a low-dose aspirin or statin. What's next?  Go to your health care provider once a year for a well check visit.  Ask your health care provider how often you should have your eyes and teeth checked.  Stay up to date on all vaccines. This information is not intended to replace advice given to you by your health care provider. Make sure you discuss any questions you have with your health care provider. Document Released: 12/07/2015 Document Revised: 11/04/2018 Document Reviewed: 11/04/2018 Elsevier Patient Education  2020 [employer].

## 2019-11-22 ENCOUNTER — Encounter: Payer: Self-pay | Admitting: Family Medicine

## 2019-11-28 DIAGNOSIS — M25561 Pain in right knee: Secondary | ICD-10-CM | POA: Diagnosis not present

## 2019-11-30 ENCOUNTER — Ambulatory Visit (INDEPENDENT_AMBULATORY_CARE_PROVIDER_SITE_OTHER): Payer: Medicare Other | Admitting: Family Medicine

## 2019-11-30 ENCOUNTER — Encounter: Payer: Self-pay | Admitting: Family Medicine

## 2019-11-30 ENCOUNTER — Other Ambulatory Visit: Payer: Self-pay

## 2019-11-30 VITALS — BP 123/73 | HR 69 | Temp 98.8°F | Wt 156.0 lb

## 2019-11-30 DIAGNOSIS — E039 Hypothyroidism, unspecified: Secondary | ICD-10-CM

## 2019-11-30 DIAGNOSIS — E782 Mixed hyperlipidemia: Secondary | ICD-10-CM | POA: Diagnosis not present

## 2019-11-30 DIAGNOSIS — I10 Essential (primary) hypertension: Secondary | ICD-10-CM

## 2019-11-30 MED ORDER — LEVOTHYROXINE SODIUM 25 MCG PO TABS: ORAL_TABLET | ORAL | 3 refills | Status: DC

## 2019-11-30 MED ORDER — ATORVASTATIN CALCIUM 80 MG PO TABS: 80.0000 mg | ORAL_TABLET | Freq: Every day | ORAL | 3 refills | Status: DC

## 2019-11-30 MED ORDER — BENAZEPRIL HCL 10 MG PO TABS: ORAL_TABLET | ORAL | 3 refills | Status: DC

## 2019-11-30 NOTE — Progress Notes (Signed)
Subjective:  Patient ID: Doris Wilkerson, female    DOB: 1946/05/12  Age: 74 y.o. MRN: [id number]  CC:  Chief Complaint  Patient presents with  . Medical Management of Chronic Issues    here for a f/u and medication review. July labs and todays lab need to got Dr Nadyne Coombes office. ([place])    HPI Doris Wilkerson presents for   Hypothyroidism: Lab Results  Component Value Date   TSH 2.740 06/06/2019  Taking medication daily. Synthroid 25 mcg daily No new hot or cold intolerance. No new hair or skin changes, heart palpitations or new fatigue. No new weight changes.   Hyperlipidemia: With history of coronary artery disease.  On Lipitor 80 mg daily.  Cardiologist, Dr. Einar Gip. On ASA 81mg  qd. Fasting today.  Lab Results  Component Value Date   CHOL 180 06/06/2019   HDL 71 06/06/2019   LDLCALC 96 06/06/2019   TRIG 67 06/06/2019   CHOLHDL 2.5 06/06/2019   Lab Results  Component Value Date   ALT 21 06/06/2019   AST 22 06/06/2019   ALKPHOS 74 06/06/2019   BILITOT 0.6 06/06/2019    Hypertension: Lotensin 10 mg daily.  Home readings: none.  No new side effects with meds.  BP Readings from Last 3 Encounters:  11/30/19 123/73  11/21/19 129/71  05/23/19 129/71   Lab Results  Component Value Date   CREATININE 0.83 06/06/2019    Followed by orthopedics, physical therapy with recent meniscal tear.  Health maintenance: Plans on scheduling mammogram.   History Patient Active Problem List   Diagnosis Date Noted  . Hordeolum externum of left lower eyelid 06/28/2018  . CAD (coronary artery disease) 12/09/2011  . Dyslipidemia 12/09/2011  . Osteopenia 12/09/2011  . Hypothyroid 12/09/2011  . Asthma 12/09/2011   Past Medical History:  Diagnosis Date  . Asthma   . Cataract   . GERD (gastroesophageal reflux disease)   . Hyperlipidemia   . Hypertension   . Thyroid disease    Past Surgical History:  Procedure Laterality Date  . Cataract Surgery    . CORONARY ANGIOPLASTY  WITH STENT PLACEMENT    . EYE SURGERY    . ORTHOPEDIC SURGERY  2012  . TONSILLECTOMY  1951   Allergies  Allergen Reactions  . Plavix [Clopidogrel Bisulfate]    Prior to Admission medications   Medication Sig Start Date End Date Taking? Authorizing Provider  aspirin 81 MG tablet Take 81 mg by mouth daily.    Yes Hayden Rasmussen, MD  atorvastatin (LIPITOR) 80 MG tablet Take 1 tablet (80 mg total) by mouth daily at 6 PM. 05/23/19  Yes [name], MD  benazepril (LOTENSIN) 10 MG tablet TAKE 1 TABLET(10 MG) BY MOUTH DAILY 05/23/19  Yes [name], MD  DM-APAP-CPM (CORICIDIN HBP PO) Take by mouth.   Yes [provider]  levothyroxine (SYNTHROID) 25 MCG tablet TAKE 1 TABLET(25 MCG) BY MOUTH DAILY BEFORE BREAKFAST 05/23/19  Yes [name], MD  OVER THE COUNTER MEDICATION Vitamin D 2000iu daily.   Yes [provider]   Social History   Socioeconomic History  . Marital status: Married    Spouse name: Not on file  . Number of children: Not on file  . Years of education: Not on file  . Highest education level: Not on file  Occupational History  . Occupation: unemployed  Tobacco Use  . Smoking status: Never Smoker  . Smokeless tobacco: Never Used  Substance and Sexual  Activity  . Alcohol use: Yes    Alcohol/week: 2.0 standard drinks    Types: 2 Standard drinks or equivalent per week    Comment: occassionally  . Drug use: No  . Sexual activity: Not on file  Other Topics Concern  . Not on file  Social History Narrative   Married. Education: college. Pt does exercise-   Social Determinants of Health   Financial Resource Strain:   . Difficulty of Paying Living Expenses: Not on file  Food Insecurity:   . Worried About Charity fundraiser in the Last Year: Not on file  . Ran Out of Food in the Last Year: Not on file  Transportation Needs:   . Lack of Transportation (Medical): Not on file  . Lack of Transportation (Non-Medical): Not on file    Physical Activity:   . Days of Exercise per Week: Not on file  . Minutes of Exercise per Session: Not on file  Stress:   . Feeling of Stress : Not on file  Social Connections:   . Frequency of Communication with Friends and Family: Not on file  . Frequency of Social Gatherings with Friends and Family: Not on file  . Attends Religious Services: Not on file  . Active Member of Clubs or Organizations: Not on file  . Attends Archivist Meetings: Not on file  . Marital Status: Not on file  Intimate Partner Violence:   . Fear of Current or Ex-Partner: Not on file  . Emotionally Abused: Not on file  . Physically Abused: Not on file  . Sexually Abused: Not on file    Review of Systems  Constitutional: Negative for fatigue and unexpected weight change.  Respiratory: Negative for chest tightness and shortness of breath.   Cardiovascular: Negative for chest pain, palpitations and leg swelling.  Gastrointestinal: Negative for abdominal pain and blood in stool.  Neurological: Negative for dizziness, syncope, light-headedness and headaches.     Objective:   Vitals:   11/30/19 0938  BP: 123/73  Pulse: 69  Temp: 98.8 F (37.1 C)  TempSrc: Temporal  SpO2: 98%  Weight: 156 lb (70.8 kg)     Physical Exam Vitals reviewed.  Constitutional:      Appearance: She is well-developed.  HENT:     Head: Normocephalic and atraumatic.  Eyes:     Conjunctiva/sclera: Conjunctivae normal.     Pupils: Pupils are equal, round, and reactive to light.  Neck:     Vascular: No carotid bruit.  Cardiovascular:     Rate and Rhythm: Normal rate and regular rhythm.     Heart sounds: Normal heart sounds.  Pulmonary:     Effort: Pulmonary effort is normal.     Breath sounds: Normal breath sounds.  Abdominal:     Palpations: Abdomen is soft. There is no pulsatile mass.     Tenderness: There is no abdominal tenderness.  Skin:    General: Skin is warm and dry.  Neurological:     Mental  Status: She is alert and oriented to person, place, and time.  Psychiatric:        Behavior: Behavior normal.        Assessment & Plan:  Doris Wilkerson is a 74 y.o. female . Mixed hyperlipidemia - Plan: Comprehensive metabolic panel, Lipid Panel, atorvastatin (LIPITOR) 80 MG tablet Hyperlipemia, mixed - Plan: Comprehensive metabolic panel, Lipid Panel, atorvastatin (LIPITOR) 80 MG tablet, benazepril (LOTENSIN) 10 MG tablet  -Tolerating current regimen, check labs.  Continue  aspirin with history of CAD.  Plans on calling cardiologist for follow-up.  No change in meds for now  Hypothyroidism, unspecified type - Plan: TSH, levothyroxine (SYNTHROID) 25 MCG tablet  -  Stable, tolerating current regimen. Medications refilled. Labs pending as above.   Essential hypertension - Plan: benazepril (LOTENSIN) 10 MG tablet  -  Stable, tolerating current regimen. Medications refilled. Labs pending as above.    Meds ordered this encounter  Medications  . levothyroxine (SYNTHROID) 25 MCG tablet    Sig: TAKE 1 TABLET(25 MCG) BY MOUTH DAILY BEFORE BREAKFAST    Dispense:  90 tablet    Refill:  3  . atorvastatin (LIPITOR) 80 MG tablet    Sig: Take 1 tablet (80 mg total) by mouth daily at 6 PM.    Dispense:  90 tablet    Refill:  3  . benazepril (LOTENSIN) 10 MG tablet    Sig: TAKE 1 TABLET(10 MG) BY MOUTH DAILY    Dispense:  90 tablet    Refill:  3   Patient Instructions   No changes today in meds. I will let you know if any concerns on your labs.  Thanks for coming in today and stay safe!    If you have lab work done today you will be contacted with your lab results within the next 2 weeks.  If you have not heard from [place] then please contact us. The fastest way to get your results is to register for My Chart.   IF you received an x-ray today, you will receive an invoice from [facility]. Please contact [facility] at [phone] with questions or concerns regarding your  invoice.   IF you received labwork today, you will receive an invoice from [facility]. Please contact [facility] at [phone] with questions or concerns regarding your invoice.   Our billing staff will not be able to assist you with questions regarding bills from these companies.  You will be contacted with the lab results as soon as they are available. The fastest way to get your results is to activate your My Chart account. Instructions are located on the last page of this paperwork. If you have not heard from [place] regarding the results in 2 weeks, please contact this office.          Signed, [name], MD [facility]

## 2019-11-30 NOTE — Patient Instructions (Addendum)
No changes today in meds. I will let you know if any concerns on your labs.  Thanks for coming in today and stay safe!    If you have lab work done today you will be contacted with your lab results within the next 2 weeks.  If you have not heard from [place] then please contact us. The fastest way to get your results is to register for My Chart.   IF you received an x-ray today, you will receive an invoice from [facility]. Please contact [facility] at [phone] with questions or concerns regarding your invoice.   IF you received labwork today, you will receive an invoice from [facility]. Please contact [facility] at [phone] with questions or concerns regarding your invoice.   Our billing staff will not be able to assist you with questions regarding bills from these companies.  You will be contacted with the lab results as soon as they are available. The fastest way to get your results is to activate your My Chart account. Instructions are located on the last page of this paperwork. If you have not heard from [place] regarding the results in 2 weeks, please contact this office.

## 2019-12-01 DIAGNOSIS — M25561 Pain in right knee: Secondary | ICD-10-CM | POA: Diagnosis not present

## 2019-12-01 LAB — COMPREHENSIVE METABOLIC PANEL
ALT: 18 IU/L (ref 0–32)
AST: 23 IU/L (ref 0–40)
Albumin/Globulin Ratio: 3.1 — ABNORMAL HIGH (ref 1.2–2.2)
Albumin: 5 g/dL — ABNORMAL HIGH (ref 3.7–4.7)
Alkaline Phosphatase: 84 IU/L (ref 39–117)
BUN/Creatinine Ratio: 16 (ref 12–28)
BUN: 12 mg/dL (ref 8–27)
Bilirubin Total: 0.7 mg/dL (ref 0.0–1.2)
CO2: 23 mmol/L (ref 20–29)
Calcium: 10.1 mg/dL (ref 8.7–10.3)
Chloride: 104 mmol/L (ref 96–106)
Creatinine, Ser: 0.75 mg/dL (ref 0.57–1.00)
GFR calc Af Amer: 91 mL/min/{1.73_m2} (ref >59)
GFR calc non Af Amer: 79 mL/min/{1.73_m2} (ref >59)
Globulin, Total: 1.6 g/dL (ref 1.5–4.5)
Glucose: 93 mg/dL (ref 65–99)
Potassium: 4.1 mmol/L (ref 3.5–5.2)
Sodium: 141 mmol/L (ref 134–144)
Total Protein: 6.6 g/dL (ref 6.0–8.5)

## 2019-12-01 LAB — LIPID PANEL
Chol/HDL Ratio: 2.1 ratio (ref 0.0–4.4)
Cholesterol, Total: 167 mg/dL (ref 100–199)
HDL: 79 mg/dL (ref >39)
LDL Chol Calc (NIH): 74 mg/dL (ref 0–99)
Triglycerides: 71 mg/dL (ref 0–149)
VLDL Cholesterol Cal: 14 mg/dL (ref 5–40)

## 2019-12-01 LAB — TSH: TSH: 1.69 u[IU]/mL (ref 0.450–4.500)

## 2019-12-09 ENCOUNTER — Encounter: Payer: Self-pay | Admitting: Family Medicine

## 2019-12-09 DIAGNOSIS — M25561 Pain in right knee: Secondary | ICD-10-CM | POA: Diagnosis not present

## 2019-12-12 DIAGNOSIS — M25561 Pain in right knee: Secondary | ICD-10-CM | POA: Diagnosis not present

## 2019-12-16 DIAGNOSIS — M25561 Pain in right knee: Secondary | ICD-10-CM | POA: Diagnosis not present

## 2019-12-22 DIAGNOSIS — M25561 Pain in right knee: Secondary | ICD-10-CM | POA: Diagnosis not present

## 2019-12-23 DIAGNOSIS — S83231D Complex tear of medial meniscus, current injury, right knee, subsequent encounter: Secondary | ICD-10-CM | POA: Diagnosis not present

## 2019-12-23 DIAGNOSIS — M25561 Pain in right knee: Secondary | ICD-10-CM | POA: Diagnosis not present

## 2020-02-05 NOTE — Progress Notes (Signed)
Primary Physician/Referring:  [name], MD  Patient ID: Doris Wilkerson, female    DOB: 1946-07-18, 74 y.o.   MRN: [id number]  Chief Complaint  Patient presents with  . Coronary Artery Disease  . Follow-up    1 yr   HPI:    Doris Wilkerson  is a 74 y.o. 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 Past medical history significant for hypertension, hyperlipidemia, asymptomatic sinus bradycardia. She presents here for her annual visit.  She is presently asymptomatic from cardiac standpoint and denies chest pain or shortness of breath.  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  Past Medical History:  Diagnosis Date  . Asthma   . Cataract   . GERD (gastroesophageal reflux disease)   . Hyperlipidemia   . Hypertension   . Thyroid disease    Past Surgical History:  Procedure Laterality Date  . Cataract Surgery    . CORONARY ANGIOPLASTY WITH STENT PLACEMENT    . EYE SURGERY    . ORTHOPEDIC SURGERY  2012  . TONSILLECTOMY  1951   Family History  Problem Relation Age of Onset  . Cancer Father   . Heart disease Brother   . Dementia Brother   . Breast cancer Neg Hx   . Colon cancer Neg Hx   . Esophageal cancer Neg Hx   . Pancreatic cancer Neg Hx   . Rectal cancer Neg Hx   . Stomach cancer Neg Hx     Social History   Tobacco Use  . Smoking status: Never Smoker  . Smokeless tobacco: Never Used  Substance Use Topics  . Alcohol use: Yes    Alcohol/week: 2.0 standard drinks    Types: 2 Standard drinks or equivalent per week    Comment: occassionally   ROS  Review of Systems  Cardiovascular: Negative for chest pain, dyspnea on exertion and leg swelling.  Musculoskeletal: Positive for joint pain (right knee).  Gastrointestinal: Negative for melena.   Objective  Blood pressure 112/69, pulse 65, temperature 98.1 F (36.7 C), height 5\' 6"   (1.676 m), weight 156 lb (70.8 kg), SpO2 98 %.  Vitals with BMI 02/06/2020 11/30/2019 11/21/2019  Height 5\' 6"  - 5' 6.5"  Weight 156 lbs 156 lbs 154 lbs  BMI 25.19 [id number]  Systolic [id number]  Diastolic 69 73 71  Pulse 65 69 -     Physical Exam  Cardiovascular: Normal rate, regular rhythm, normal heart sounds and intact distal pulses. Exam reveals no gallop.  No murmur heard. Pulses:      Dorsalis pedis pulses are 1+ on the right side.  No leg edema, no JVD. Vascular exam is normal except right DP is 1 +.  Pulmonary/Chest: Effort normal and breath sounds normal.  Abdominal: Soft. Bowel sounds are normal.   Laboratory examination:   Recent Labs    06/06/19 0821 11/30/19 0955  NA 140 141  K 4.6 4.1  CL 105 104  CO2 22 23  GLUCOSE 93 93  BUN 16 12  CREATININE 0.83 0.75  CALCIUM 10.5* 10.1  GFRNONAA 71 79  GFRAA 81 91   CrCl cannot be calculated (Patient's most recent lab result is older than the maximum 21 days allowed.).  CMP Latest Ref Rng & Units 11/30/2019 06/06/2019 11/19/2018  Glucose 65 - 99 mg/dL 93 93 90  BUN 8 - 27 mg/dL 12 16  17  Creatinine 0.57 - 1.00 mg/dL 0.75 0.83 0.73  Sodium 134 - 144 mmol/L 141 140 138  Potassium 3.5 - 5.2 mmol/L 4.1 4.6 4.5  Chloride 96 - 106 mmol/L 104 105 103  CO2 20 - 29 mmol/L 23 22 23   Calcium 8.7 - 10.3 mg/dL 10.1 10.5(H) 10.1  Total Protein 6.0 - 8.5 g/dL 6.6 6.5 6.0  Total Bilirubin 0.0 - 1.2 mg/dL 0.7 0.6 0.5  Alkaline Phos 39 - 117 IU/L 84 74 73  AST 0 - 40 IU/L 23 22 22   ALT 0 - 32 IU/L 18 21 21    CBC Latest Ref Rng & Units 11/19/2018 09/04/2017 03/11/2017  WBC 3.4 - 10.8 x10E3/uL 5.3 5.4 9.6  Hemoglobin 11.1 - 15.9 g/dL 12.7 12.7 12.5  Hematocrit 34.0 - 46.6 % 38.1 38.5 37.4  Platelets 150 - 450 x10E3/uL 271 296 287   Lipid Panel     Component Value Date/Time   CHOL 167 11/30/2019 0955   TRIG 71 11/30/2019 0955   HDL 79 11/30/2019 0955   CHOLHDL 2.1 11/30/2019 0955   CHOLHDL 2.1 08/25/2016 1048   VLDL 12  08/25/2016 1048   LDLCALC 74 11/30/2019 0955   HEMOGLOBIN A1C No results found for: HGBA1C, MPG TSH Recent Labs    06/06/19 0821 11/30/19 0955  TSH 2.740 1.690   Medications and allergies   Allergies  Allergen Reactions  . Plavix [Clopidogrel Bisulfate]      Current Outpatient Medications  Medication Instructions  . aspirin 81 mg, Daily  . atorvastatin (LIPITOR) 80 mg, Oral, Daily-1800  . benazepril (LOTENSIN) 10 MG tablet TAKE 1 TABLET(10 MG) BY MOUTH DAILY  . DM-APAP-CPM (CORICIDIN HBP PO) Oral  . levothyroxine (SYNTHROID) 25 MCG tablet TAKE 1 TABLET(25 MCG) BY MOUTH DAILY BEFORE BREAKFAST  . Magnesium 250 MG TABS 1 tablet, Oral, Daily  . OVER THE COUNTER MEDICATION Vitamin D 2000iu daily.   Radiology:   No results found.  Cardiac Studies:   Heart cath-proximal LAD stent 11/28/02: 3.0x18 Cypher.  Exercise sestamibi 11/28/11: Exercise duration 6 min. 5.3 METs. Normal perfusion. No ischemia. Normal.  Echo 04/30/11 Normal LVEF. Diastolic dysfunction. Flat coaption of  MV leaflet, trace MR without MVP.   EKG  EKG 02/06/2020: Normal sinus rhythm with rate of 63 bpm, left axis deviation, left anterior fascicular block.  Incomplete right bundle branch block.  No evidence of ischemia.  No significant change from EKG 12/23/2018   Assessment     ICD-10-CM   1. Coronary artery disease involving native coronary artery of native heart without angina pectoris  I25.10 EKG 12-Lead  2. Primary hypertension  I10   3. Hypercholesteremia  E78.00      No orders of the defined types were placed in this encounter.  There are no discontinued medications.  Recommendations:   Doris Wilkerson  is a 74 y.o. 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 Past medical history significant for hypertension, hyperlipidemia, asymptomatic sinus bradycardia. She presents here for her annual visit.  She is  presently doing well and is asymptomatic.  No change in physical exam, she has mildly reduced right dorsalis pedis pulse but otherwise had vascular examination is normal.  I reviewed her external labs, lipids are also under excellent control with non-HDL cholesterol being at goal.  Blood pressure is not well controlled as well and she has not had any angina pectoris.  No changes in the medications were  done today, I will see her back in a year or sooner if problems.  [name], MD, [facility] 02/06/2020, 9:21 AM [facility]. [place] Office: [phone]

## 2020-02-06 ENCOUNTER — Other Ambulatory Visit: Payer: Self-pay

## 2020-02-06 ENCOUNTER — Encounter: Payer: Self-pay | Admitting: Cardiology

## 2020-02-06 ENCOUNTER — Ambulatory Visit: Payer: Medicare Other | Admitting: Cardiology

## 2020-02-06 VITALS — BP 112/69 | HR 65 | Temp 98.1°F | Ht 66.0 in | Wt 156.0 lb

## 2020-02-06 DIAGNOSIS — E7800 Pure hypercholesterolemia, unspecified: Secondary | ICD-10-CM

## 2020-02-06 DIAGNOSIS — I2510 Atherosclerotic heart disease of native coronary artery without angina pectoris: Secondary | ICD-10-CM | POA: Diagnosis not present

## 2020-02-06 DIAGNOSIS — I10 Essential (primary) hypertension: Secondary | ICD-10-CM | POA: Diagnosis not present

## 2020-02-21 ENCOUNTER — Other Ambulatory Visit: Payer: Self-pay | Admitting: Family Medicine

## 2020-02-21 DIAGNOSIS — Z1231 Encounter for screening mammogram for malignant neoplasm of breast: Secondary | ICD-10-CM

## 2020-03-13 ENCOUNTER — Other Ambulatory Visit: Payer: Self-pay

## 2020-03-13 ENCOUNTER — Ambulatory Visit
Admission: RE | Admit: 2020-03-13 | Discharge: 2020-03-13 | Disposition: A | Discharge status: Home / Self Care | Payer: Medicare Other | Source: Ambulatory Visit | Attending: Family Medicine | Admitting: Family Medicine

## 2020-03-13 DIAGNOSIS — Z1231 Encounter for screening mammogram for malignant neoplasm of breast: Secondary | ICD-10-CM | POA: Diagnosis not present

## 2020-06-01 ENCOUNTER — Other Ambulatory Visit: Payer: Self-pay

## 2020-06-01 ENCOUNTER — Encounter: Payer: Self-pay | Admitting: Family Medicine

## 2020-06-01 ENCOUNTER — Ambulatory Visit (INDEPENDENT_AMBULATORY_CARE_PROVIDER_SITE_OTHER): Payer: Medicare Other | Admitting: Family Medicine

## 2020-06-01 VITALS — BP 126/76 | HR 75 | Temp 98.0°F | Ht 66.0 in | Wt 156.0 lb

## 2020-06-01 DIAGNOSIS — I2510 Atherosclerotic heart disease of native coronary artery without angina pectoris: Secondary | ICD-10-CM | POA: Diagnosis not present

## 2020-06-01 DIAGNOSIS — M8588 Other specified disorders of bone density and structure, other site: Secondary | ICD-10-CM | POA: Diagnosis not present

## 2020-06-01 DIAGNOSIS — D239 Other benign neoplasm of skin, unspecified: Secondary | ICD-10-CM | POA: Diagnosis not present

## 2020-06-01 DIAGNOSIS — R5383 Other fatigue: Secondary | ICD-10-CM

## 2020-06-01 DIAGNOSIS — Z23 Encounter for immunization: Secondary | ICD-10-CM

## 2020-06-01 DIAGNOSIS — I10 Essential (primary) hypertension: Secondary | ICD-10-CM | POA: Diagnosis not present

## 2020-06-01 DIAGNOSIS — E782 Mixed hyperlipidemia: Secondary | ICD-10-CM

## 2020-06-01 DIAGNOSIS — E039 Hypothyroidism, unspecified: Secondary | ICD-10-CM

## 2020-06-01 DIAGNOSIS — Z0000 Encounter for general adult medical examination without abnormal findings: Secondary | ICD-10-CM

## 2020-06-01 MED ORDER — LEVOTHYROXINE SODIUM 25 MCG PO TABS: ORAL_TABLET | ORAL | 3 refills | Status: DC

## 2020-06-01 MED ORDER — ATORVASTATIN CALCIUM 80 MG PO TABS: 80.0000 mg | ORAL_TABLET | Freq: Every day | ORAL | 3 refills | Status: DC

## 2020-06-01 MED ORDER — BENAZEPRIL HCL 10 MG PO TABS: ORAL_TABLET | ORAL | 3 refills | Status: DC

## 2020-06-01 NOTE — Progress Notes (Signed)
Subjective:  Patient ID: Chalet Kerwin, female    DOB: 12-01-1945  Age: 74 y.o. MRN: [id number]  CC:  Chief Complaint  Patient presents with  . Medicare Wellness    Pt reports she feels well other than feeling shut in due to the pandemic. Pt repors having Pain in the R knee and would like provider to look at it. pt states she is willing to make another appointment for this if needed. 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 Pt is not fasting.  Marland Kitchen Referral    Pt reports she would like a referral to dermatology.    HPI Aalina Brege presents for  Medicare wellness exam. Care team: Primary care provider, me Cardiology, Dr. Einar Gip, history of CAD Ophthalmology, Dr. Clydene Laming Sports med/ortho: Dr. Delilah Shan (R knee pain) Oral surgeon: Docia Barrier (oral surgery earlier this week) GI: [facility]   Hyperlipidemia: With history of CAD, stent in 2004 exercise stress test 2013 5.3 METS, no ischemia. Currently takes Lipitor 80 mg daily, aspirin 81 mg daily Cardiology visit March 15, no changes, 1 year follow-up Lab Results  Component Value Date   CHOL 167 11/30/2019   HDL 79 11/30/2019   LDLCALC 74 11/30/2019   TRIG 71 11/30/2019   CHOLHDL 2.1 11/30/2019   Lab Results  Component Value Date   ALT 18 11/30/2019   AST 23 11/30/2019   ALKPHOS 84 11/30/2019   BILITOT 0.7 11/30/2019   Hypothyroidism: Lab Results  Component Value Date   TSH 1.690 11/30/2019  Synthroid 25 mcg daily Taking medication daily.  No new hot or cold intolerance. No new hair or skin changes, heart palpitations. No new weight changes. Some fatigue - past 3 months. Denies depression. No cp/lightheadedness or dizziness, no dyspnea. Prior less exercise d/t knee pain - followed by Dr. Delilah Shan.   Hypertension: Benazepril 10 mg daily. No new side effects.  Home readings: none.  BP Readings from Last 3 Encounters:  06/01/20 126/76  02/06/20 112/69    11/30/19 123/73   Lab Results  Component Value Date   CREATININE 0.75 11/30/2019    Fall Risk  06/01/2020 11/30/2019 11/21/2019 05/23/2019 05/23/2019  Falls in the past year? 0 0 0 0 0  Number falls in past yr: - 0 0 0 0  Injury with Fall? - 0 0 0 0  Follow up Falls evaluation completed Falls evaluation completed Falls evaluation completed;Education provided - -   Depression screen [facility] 2/9 06/01/2020 11/30/2019 11/21/2019 05/23/2019 05/23/2019  Decreased Interest 0 0 0 0 0  Down, Depressed, Hopeless 0 0 0 1 1  PHQ - 2 Score 0 0 0 1 1   Cancer screening: Colonoscopy 11/10/2017 repeat 10 years Mammogram 03/13/2020 Derm - needs referral. [facility] in [facility]. 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  Bone density, DEXA scan 10/08/2017 with osteopenia, T score -1.8 at the lumbar spine.  FRAX score 15%. Calcium - no supplement, shows up on teeth. Lactose intolerant. Takes vit D.   Immunization History  Administered Date(s) Administered  . Hepatitis A 07/14/2006, 04/02/2007  . Hepatitis B 05/30/1998, 10/26/1998  . IPV 07/14/2006  . Influenza, Seasonal, Injecte, Preservative Fre 12/13/2012  . Influenza,inj,Quad PF,6+ Mos 09/12/2013, 10/16/2014, 09/24/2015, 08/25/2016, 09/16/2017, 10/08/2018  . PFIZER SARS-COV-2 Vaccination 01/07/2020, 01/31/2020  . Pneumococcal Conjugate-13 05/14/2015  . Pneumococcal Polysaccharide-23 10/15/2013  . Td 06/01/2020  . Tdap 07/14/2006  . Typhoid Inactivated 07/14/2006  . Typhoid  Parenteral 07/14/2006  . Yellow Fever 07/14/2006  . Zoster 11/23/2008  . Zoster Recombinat (Shingrix) 03/31/2017, 09/14/2017  COVID-19 vaccine: had done in feb/march. Pfizer.   Requests td today - aware of out of pocket cost.   Functional Status Survey: Is the patient deaf or have difficulty hearing?: No Does the patient have difficulty seeing, even when wearing glasses/contacts?: No Does the patient have difficulty concentrating, remembering, or making decisions?: No Does the  patient have difficulty walking or climbing stairs?: Yes - with knee pain - see above Does the patient have difficulty dressing or bathing?: No Does the patient have difficulty doing errands alone such as visiting a doctor's office or shopping?: No 6CIT Screen 06/01/2020 11/21/2019 09/07/2017  What Year? 0 points 0 points 0 points  What month? 0 points 0 points 0 points  What time? 0 points 0 points 0 points  Count back from 20 0 points 0 points 0 points  Months in reverse 0 points 0 points 0 points  Repeat phrase 0 points 0 points 0 points  Total Score 0 0 0      Office Visit from 06/01/2020 in [facility] at [facility]  AUDIT-C Score 1     No exam data present optho - once per year usually - due for OV. Prior cataracts.   Dental: Dr. Leonides Schanz, every 4 months.   Exercise: walking dog 2 times per day. Limited by R knee pain - will f/u with ortho.   Advanced directives: has HCPOA and living will.       History Patient Active Problem List   Diagnosis Date Noted  . Hordeolum externum of left lower eyelid 06/28/2018  . CAD (coronary artery disease) 12/09/2011  . Dyslipidemia 12/09/2011  . Osteopenia 12/09/2011  . Hypothyroid 12/09/2011  . Asthma 12/09/2011   Past Medical History:  Diagnosis Date  . Asthma   . Cataract   . GERD (gastroesophageal reflux disease)   . Hyperlipidemia   . Hypertension   . Thyroid disease    Past Surgical History:  Procedure Laterality Date  . Cataract Surgery    . CORONARY ANGIOPLASTY WITH STENT PLACEMENT    . EYE SURGERY    . ORTHOPEDIC SURGERY  2012  . TONSILLECTOMY  1951   Allergies  Allergen Reactions  . Plavix [Clopidogrel Bisulfate]   . Other     seasonal   Prior to Admission medications   Medication Sig Start Date End Date Taking? Authorizing Provider  atorvastatin (LIPITOR) 80 MG tablet Take 1 tablet (80 mg total) by mouth daily at 6 PM. 11/30/19  Yes [name], MD  benazepril (LOTENSIN) 10 MG tablet TAKE 1 TABLET(10 MG)  BY MOUTH DAILY 11/30/19  Yes [name], MD  levothyroxine (SYNTHROID) 25 MCG tablet TAKE 1 TABLET(25 MCG) BY MOUTH DAILY BEFORE BREAKFAST 11/30/19  Yes [name], MD  aspirin 81 MG tablet Take 81 mg by mouth daily.  Patient not taking: Reported on 06/01/2020    Hayden Rasmussen, MD  DM-APAP-CPM (CORICIDIN HBP PO) Take by mouth. Patient not taking: Reported on 06/01/2020    [provider]  Magnesium 250 MG TABS Take 1 tablet by mouth daily. Patient not taking: Reported on 06/01/2020    [provider]  OVER THE COUNTER MEDICATION Vitamin D 2000iu daily. Patient not taking: Reported on 06/01/2020    [provider]   Social History   Socioeconomic History  . Marital status: Married    Spouse name: Not on file  .  Number of children: 0  . Years of education: Not on file  . Highest education level: Not on file  Occupational History  . Occupation: unemployed  Tobacco Use  . Smoking status: Never Smoker  . Smokeless tobacco: Never Used  Vaping Use  . Vaping Use: Never used  Substance and Sexual Activity  . Alcohol use: Yes    Alcohol/week: 1.0 standard drink    Types: 1 Glasses of wine per week    Comment: occassionally  . Drug use: No  . Sexual activity: Not Currently  Other Topics Concern  . Not on file  Social History Narrative   Married. Education: college. Pt does exercise-   Social Determinants of Health   Financial Resource Strain:   . Difficulty of Paying Living Expenses:   Food Insecurity:   . Worried About Charity fundraiser in the Last Year:   . Arboriculturist in the Last Year:   Transportation Needs:   . Film/video editor (Medical):   Marland Kitchen Lack of Transportation (Non-Medical):   Physical Activity:   . Days of Exercise per Week:   . Minutes of Exercise per Session:   Stress:   . Feeling of Stress :   Social Connections:   . Frequency of Communication with Friends and Family:   . Frequency of Social Gatherings with Friends  and Family:   . Attends Religious Services:   . Active Member of Clubs or Organizations:   . Attends Archivist Meetings:   Marland Kitchen Marital Status:   Intimate Partner Violence:   . Fear of Current or Ex-Partner:   . Emotionally Abused:   Marland Kitchen Physically Abused:   . Sexually Abused:     Review of Systems 13 point review of systems per patient health survey noted.  Negative other than as indicated above or in HPI.  Fatigue as above.   Objective:   Vitals:   06/01/20 0906  BP: 126/76  Pulse: 75  Temp: 98 F (36.7 C)  TempSrc: Temporal  SpO2: 95%  Weight: 156 lb (70.8 kg)  Height: '5\' 6"'  (1.676 m)     Physical Exam Vitals reviewed.  Constitutional:      Appearance: She is well-developed.  HENT:     Head: Normocephalic and atraumatic.     Right Ear: External ear normal.     Left Ear: External ear normal.  Eyes:     Conjunctiva/sclera: Conjunctivae normal.     Pupils: Pupils are equal, round, and reactive to light.  Neck:     Thyroid: No thyromegaly.  Cardiovascular:     Rate and Rhythm: Normal rate and regular rhythm.     Heart sounds: Normal heart sounds. No murmur heard.   Pulmonary:     Effort: Pulmonary effort is normal. No respiratory distress.     Breath sounds: Normal breath sounds. No wheezing.  Abdominal:     General: Bowel sounds are normal.     Palpations: Abdomen is soft.     Tenderness: There is no abdominal tenderness.  Musculoskeletal:        General: No tenderness. Normal range of motion.     Cervical back: Normal range of motion and neck supple.     Right lower leg: No edema.     Left lower leg: No edema.  Lymphadenopathy:     Cervical: No cervical adenopathy.  Skin:    General: Skin is warm and dry.     Findings: No rash.  Neurological:  General: No focal deficit present.     Mental Status: She is alert and oriented to person, place, and time.  Psychiatric:        Behavior: Behavior normal.        Thought Content: Thought content  normal.        Assessment & Plan:  Tonisha Silvey is a 74 y.o. female . Medicare annual wellness visit, subsequent  - - anticipatory guidance as below in AVS, screening labs if needed. Health maintenance items as above in HPI discussed/recommended as applicable.  - no concerning responses on depression, fall, or functional status screening. Any positive responses noted as above. Advanced directives discussed as in CHL.   Need for prophylactic vaccination with combined diphtheria-tetanus-pertussis (DTP) vaccine - Plan: Td : Tetanus/diphtheria >7yo Preservative  free  Mixed hyperlipidemia - Plan: atorvastatin (LIPITOR) 80 MG tablet Hyperlipemia, mixed - Plan: atorvastatin (LIPITOR) 80 MG tablet, benazepril (LOTENSIN) 10 MG tablet  -  Stable, tolerating current regimen. Medications refilled.  Continue routine follow-up with cardiology, asymptomatic with history of CAD.  Continue aspirin  Essential hypertension - Plan: benazepril (LOTENSIN) 10 MG tablet  -Stable, continue benazepril same dose.  Check CMP  Hypothyroidism, unspecified type - Plan: levothyroxine (SYNTHROID) 25 MCG tablet, TSH  -Tolerating Synthroid, does admit to some increased fatigue past few months.  Check TSH,  Osteopenia of lumbar spine - Plan: DG Bone Density  -Calcium recommended for at least 1200 to 1500 mg/day, with difficulty with dairy, discussed other high calcium foods as well as calcium supplement as options.  She currently takes vitamin D supplementation.  Updated bone density test ordered.  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  Fatigue, unspecified type - Plan: Comprehensive metabolic panel, TSH, CBC  -Denies depression.  Denies new cardiac symptoms.  Some limitation in exercise may be contributing, plans to follow-up with her orthopedic specialist regarding her knee pain and difficulties.  Will check TSH, CBC,  CMP.  Follow-up next few weeks to discuss further.  RTC/ER precautions if worsening   Meds ordered this encounter  Medications  . atorvastatin (LIPITOR) 80 MG tablet    Sig: Take 1 tablet (80 mg total) by mouth daily at 6 PM.    Dispense:  90 tablet    Refill:  3  . benazepril (LOTENSIN) 10 MG tablet    Sig: TAKE 1 TABLET(10 MG) BY MOUTH DAILY    Dispense:  90 tablet    Refill:  3  . levothyroxine (SYNTHROID) 25 MCG tablet    Sig: TAKE 1 TABLET(25 MCG) BY MOUTH DAILY BEFORE BREAKFAST    Dispense:  90 tablet    Refill:  3   Patient Instructions     Calcium at least 1200-1543m per day - can be obtained through supplement or food. I will order another density test.   I will check some labs, but please follow up in next few weeks to discuss results and fatigue further.  Return to the clinic or go to the nearest emergency room if any of your symptoms worsen or new symptoms occur.   Fatigue If you have fatigue, you feel tired all the time and have a lack of energy or a lack of motivation. Fatigue may make it difficult to start or complete tasks because of exhaustion. In general, occasional or mild fatigue is often a normal response to activity or life. However, long-lasting (chronic) or extreme fatigue may  be a symptom of a medical condition. Follow these instructions at home: General instructions  Watch your fatigue for any changes.  Go to bed and get up at the same time every day.  Avoid fatigue by pacing yourself during the day and getting enough sleep at night.  Maintain a healthy weight. Medicines  Take over-the-counter and prescription medicines only as told by your health care provider.  Take a multivitamin, if told by your health care provider.  Do not use herbal or dietary supplements unless they are approved by your health care provider. Activity   Exercise regularly, as told by your health care provider.  Use or practice techniques to help you relax, such as  yoga, tai chi, meditation, or massage therapy. Eating and drinking   Avoid heavy meals in the evening.  Eat a well-balanced diet, which includes lean proteins, whole grains, plenty of fruits and vegetables, and low-fat dairy products.  Avoid consuming too much caffeine.  Avoid the use of alcohol.  Drink enough fluid to keep your urine pale yellow. Lifestyle  Change situations that cause you stress. Try to keep your work and personal schedule in balance.  Do not use any products that contain nicotine or tobacco, such as cigarettes and e-cigarettes. If you need help quitting, ask your health care provider.  Do not use drugs. Contact a health care provider if:  Your fatigue does not get better.  You have a fever.  You suddenly lose or gain weight.  You have headaches.  You have trouble falling asleep or sleeping through the night.  You feel angry, guilty, anxious, or sad.  You are unable to have a bowel movement (constipation).  Your skin is dry.  You have swelling in your legs or another part of your body. Get help right away if:  You feel confused.  Your vision is blurry.  You feel faint or you pass out.  You have a severe headache.  You have severe pain in your abdomen, your back, or the area between your waist and hips (pelvis).  You have chest pain, shortness of breath, or an irregular or fast heartbeat.  You are unable to urinate, or you urinate less than normal.  You have abnormal bleeding, such as bleeding from the rectum, vagina, nose, lungs, or nipples.  You vomit blood.  You have thoughts about hurting yourself or others. If you ever feel like you may hurt yourself or others, or have thoughts about taking your own life, get help right away. You can go to your nearest emergency department or call:  Your local emergency services (911 in the [place]  A suicide crisis helpline, such as the [place] at 708-311-3524. This  is open 24 hours a day. Summary  If you have fatigue, you feel tired all the time and have a lack of energy or a lack of motivation.  Fatigue may make it difficult to start or complete tasks because of exhaustion.  Long-lasting (chronic) or extreme fatigue may be a symptom of a medical condition.  Exercise regularly, as told by your health care provider.  Change situations that cause you stress. Try to keep your work and personal schedule in balance. This information is not intended to replace advice given to you by your health care provider. Make sure you discuss any questions you have with your health care provider. Document Revised: 06/01/2019 Document Reviewed: 08/05/2017 Elsevier Patient Education  [place].   Osteopenia  Osteopenia is a loss  of thickness (density) inside of the bones. Another name for osteopenia is low bone mass. Mild osteopenia is a normal part of aging. It is not a disease, and it does not cause symptoms. However, if you have osteopenia and continue to lose bone mass, you could develop a condition that causes the bones to become thin and break more easily (osteoporosis). You may also lose some height, have back pain, and have a stooped posture. Although osteopenia is not a disease, making changes to your lifestyle and diet can help to prevent osteopenia from developing into osteoporosis. What are the causes? Osteopenia is caused by loss of calcium in the bones.  Bones are constantly changing. Old bone cells are continually being replaced with new bone cells. This process builds new bone. The mineral calcium is needed to build new bone and maintain bone density. Bone density is usually highest around age 22. After that, most people's bodies cannot replace all the bone they have lost with new bone. What increases the risk? You are more likely to develop this condition if:  You are older than age 34.  You are a woman who went through menopause  early.  You have a long illness that keeps you in bed.  You do not get enough exercise.  You lack certain nutrients (malnutrition).  You have an overactive thyroid gland (hyperthyroidism).  You smoke.  You drink a lot of alcohol.  You are taking medicines that weaken the bones, such as steroids. What are the signs or symptoms? This condition does not cause any symptoms. You may have a slightly higher risk for bone breaks (fractures), so getting fractures more easily than normal may be an indication of osteopenia. How is this diagnosed? Your health care provider can diagnose this condition with a special type of X-ray exam that measures bone density (dual-energy X-ray absorptiometry, DEXA). This test can measure bone density in your hips, spine, and wrists. Osteopenia has no symptoms, so this condition is usually diagnosed after a routine bone density screening test is done for osteoporosis. This routine screening is usually done for:  Women who are age 76 or older.  Men who are age 48 or older. If you have risk factors for osteopenia, you may have the screening test at an earlier age. How is this treated? Making dietary and lifestyle changes can lower your risk for osteoporosis. If you have severe osteopenia that is close to becoming osteoporosis, your health care provider may prescribe medicines and dietary supplements such as calcium and vitamin D. These supplements help to rebuild bone density. Follow these instructions at home:   Take over-the-counter and prescription medicines only as told by your health care provider. These include vitamins and supplements.  Eat a diet that is high in calcium and vitamin D. ? Calcium is found in dairy products, beans, salmon, and leafy green vegetables like spinach and broccoli. ? Look for foods that have vitamin D and calcium added to them (fortified foods), such as orange juice, cereal, and bread.  Do 30 or more minutes of a weight-bearing  exercise every day, such as walking, jogging, or playing a sport. These types of exercises strengthen the bones.  Take precautions at home to lower your risk of falling, such as: ? Keeping rooms well-lit and free of clutter, such as cords. ? Installing safety rails on stairs. ? Using rubber mats in the bathroom or other areas that are often wet or slippery.  Do not use any products that contain nicotine  or tobacco, such as cigarettes and e-cigarettes. If you need help quitting, ask your health care provider.  Avoid alcohol or limit alcohol intake to no more than 1 drink a day for nonpregnant women and 2 drinks a day for men. One drink equals 12 oz of beer, 5 oz of wine, or 1 oz of hard liquor.  Keep all follow-up visits as told by your health care provider. This is important. Contact a health care provider if:  You have not had a bone density screening for osteoporosis and you are: ? A woman, age 43 or older. ? A man, age 45 or older.  You are a postmenopausal woman who has not had a bone density screening for osteoporosis.  You are older than age 25 and you want to know if you should have bone density screening for osteoporosis. Summary  Osteopenia is a loss of thickness (density) inside of the bones. Another name for osteopenia is low bone mass.  Osteopenia is not a disease, but it may increase your risk for a condition that causes the bones to become thin and break more easily (osteoporosis).  You may be at risk for osteopenia if you are older than age 36 or if you are a woman who went through early menopause.  Osteopenia does not cause any symptoms, but it can be diagnosed with a bone density screening test.  Dietary and lifestyle changes are the first treatment for osteopenia. These may lower your risk for osteoporosis. This information is not intended to replace advice given to you by your health care provider. Make sure you discuss any questions you have with your health care  provider. Document Revised: 10/23/2017 Document Reviewed: 08/19/2017 Elsevier Patient Education  2020 [place] Maintenance After Age 71 After age 67, you are at a higher risk for certain long-term diseases and infections as well as injuries from falls. Falls are a major cause of broken bones and head injuries in people who are older than age 3. Getting regular preventive care can help to keep you healthy and well. Preventive care includes getting regular testing and making lifestyle changes as recommended by your health care provider. Talk with your health care provider about:  Which screenings and tests you should have. A screening is a test that checks for a disease when you have no symptoms.  A diet and exercise plan that is right for you. What should I know about screenings and tests to prevent falls? Screening and testing are the best ways to find a health problem early. Early diagnosis and treatment give you the best chance of managing medical conditions that are common after age 60. Certain conditions and lifestyle choices may make you more likely to have a fall. Your health care provider may recommend:  Regular vision checks. Poor vision and conditions such as cataracts can make you more likely to have a fall. If you wear glasses, make sure to get your prescription updated if your vision changes.  Medicine review. Work with your health care provider to regularly review all of the medicines you are taking, including over-the-counter medicines. Ask your health care provider about any side effects that may make you more likely to have a fall. Tell your health care provider if any medicines that you take make you feel dizzy or sleepy.  Osteoporosis screening. Osteoporosis is a condition that causes the bones to get weaker. This can make the bones weak and cause them to break more easily.  Blood pressure  screening. Blood pressure changes and medicines to control blood pressure  can make you feel dizzy.  Strength and balance checks. Your health care provider may recommend certain tests to check your strength and balance while standing, walking, or changing positions.  Foot health exam. Foot pain and numbness, as well as not wearing proper footwear, can make you more likely to have a fall.  Depression screening. You may be more likely to have a fall if you have a fear of falling, feel emotionally low, or feel unable to do activities that you used to do.  Alcohol use screening. Using too much alcohol can affect your balance and may make you more likely to have a fall. What actions can I take to lower my risk of falls? General instructions  Talk with your health care provider about your risks for falling. Tell your health care provider if: ? You fall. Be sure to tell your health care provider about all falls, even ones that seem minor. ? You feel dizzy, sleepy, or off-balance.  Take over-the-counter and prescription medicines only as told by your health care provider. These include any supplements.  Eat a healthy diet and maintain a healthy weight. A healthy diet includes low-fat dairy products, low-fat (lean) meats, and fiber from whole grains, beans, and lots of fruits and vegetables. Home safety  Remove any tripping hazards, such as rugs, cords, and clutter.  Install safety equipment such as grab bars in bathrooms and safety rails on stairs.  Keep rooms and walkways well-lit. Activity   Follow a regular exercise program to stay fit. This will help you maintain your balance. Ask your health care provider what types of exercise are appropriate for you.  If you need a cane or walker, use it as recommended by your health care provider.  Wear supportive shoes that have nonskid soles. Lifestyle  Do not drink alcohol if your health care provider tells you not to drink.  If you drink alcohol, limit how much you have: ? 0-1 drink a day for women. ? 0-2 drinks  a day for men.  Be aware of how much alcohol is in your drink. In the [place] one drink equals one typical bottle of beer (12 oz), one-half glass of wine (5 oz), or one shot of hard liquor (1 oz).  Do not use any products that contain nicotine or tobacco, such as cigarettes and e-cigarettes. If you need help quitting, ask your health care provider. Summary  Having a healthy lifestyle and getting preventive care can help to protect your health and wellness after age 33.  Screening and testing are the best way to find a health problem early and help you avoid having a fall. Early diagnosis and treatment give you the best chance for managing medical conditions that are more common for people who are older than age 54.  Falls are a major cause of broken bones and head injuries in people who are older than age 50. Take precautions to prevent a fall at home.  Work with your health care provider to learn what changes you can make to improve your health and wellness and to prevent falls. This information is not intended to replace advice given to you by your health care provider. Make sure you discuss any questions you have with your health care provider. Document Revised: 03/03/2019 Document Reviewed: 09/23/2017 Elsevier Patient Education  [employer].   If you have lab work done today you will be contacted with your lab results  within the next 2 weeks.  If you have not heard from [place] then please contact us. The fastest way to get your results is to register for My Chart.   IF you received an x-ray today, you will receive an invoice from [facility]. Please contact [facility]iology at [phone] with questions or concerns regarding your invoice.   IF you received labwork today, you will receive an invoice from [place]. Please contact [facility] at [phone] with questions or concerns regarding your invoice.   Our billing staff will not be able to assist you with questions  regarding bills from these companies.  You will be contacted with the lab results as soon as they are available. The fastest way to get your results is to activate your My Chart account. Instructions are located on the last page of this paperwork. If you have not heard from [place] regarding the results in 2 weeks, please contact this office.         Signed, [name], MD [facility]

## 2020-06-01 NOTE — Patient Instructions (Addendum)
Calcium at least 1200-1543m per day - can be obtained through supplement or food. I will order another density test.   I will check some labs, but please follow up in next few weeks to discuss results and fatigue further.  Return to the clinic or go to the nearest emergency room if any of your symptoms worsen or new symptoms occur.   Fatigue If you have fatigue, you feel tired all the time and have a lack of energy or a lack of motivation. Fatigue may make it difficult to start or complete tasks because of exhaustion. In general, occasional or mild fatigue is often a normal response to activity or life. However, long-lasting (chronic) or extreme fatigue may be a symptom of a medical condition. Follow these instructions at home: General instructions  Watch your fatigue for any changes.  Go to bed and get up at the same time every day.  Avoid fatigue by pacing yourself during the day and getting enough sleep at night.  Maintain a healthy weight. Medicines  Take over-the-counter and prescription medicines only as told by your health care provider.  Take a multivitamin, if told by your health care provider.  Do not use herbal or dietary supplements unless they are approved by your health care provider. Activity   Exercise regularly, as told by your health care provider.  Use or practice techniques to help you relax, such as yoga, tai chi, meditation, or massage therapy. Eating and drinking   Avoid heavy meals in the evening.  Eat a well-balanced diet, which includes lean proteins, whole grains, plenty of fruits and vegetables, and low-fat dairy products.  Avoid consuming too much caffeine.  Avoid the use of alcohol.  Drink enough fluid to keep your urine pale yellow. Lifestyle  Change situations that cause you stress. Try to keep your work and personal schedule in balance.  Do not use any products that contain nicotine or tobacco, such as cigarettes and e-cigarettes.  If you need help quitting, ask your health care provider.  Do not use drugs. Contact a health care provider if:  Your fatigue does not get better.  You have a fever.  You suddenly lose or gain weight.  You have headaches.  You have trouble falling asleep or sleeping through the night.  You feel angry, guilty, anxious, or sad.  You are unable to have a bowel movement (constipation).  Your skin is dry.  You have swelling in your legs or another part of your body. Get help right away if:  You feel confused.  Your vision is blurry.  You feel faint or you pass out.  You have a severe headache.  You have severe pain in your abdomen, your back, or the area between your waist and hips (pelvis).  You have chest pain, shortness of breath, or an irregular or fast heartbeat.  You are unable to urinate, or you urinate less than normal.  You have abnormal bleeding, such as bleeding from the rectum, vagina, nose, lungs, or nipples.  You vomit blood.  You have thoughts about hurting yourself or others. If you ever feel like you may hurt yourself or others, or have thoughts about taking your own life, get help right away. You can go to your nearest emergency department or call:  Your local emergency services (911 in the [place]  A suicide crisis helpline, such as the [place] 1561-531-0292 This is open 24 hours a day. Summary  If you have fatigue,  you feel tired all the time and have a lack of energy or a lack of motivation.  Fatigue may make it difficult to start or complete tasks because of exhaustion.  Long-lasting (chronic) or extreme fatigue may be a symptom of a medical condition.  Exercise regularly, as told by your health care provider.  Change situations that cause you stress. Try to keep your work and personal schedule in balance. This information is not intended to replace advice given to you by your health care provider. Make sure  you discuss any questions you have with your health care provider. Document Revised: 06/01/2019 Document Reviewed: 08/05/2017 Elsevier Patient Education  [employer].   Osteopenia  Osteopenia is a loss of thickness (density) inside of the bones. Another name for osteopenia is low bone mass. Mild osteopenia is a normal part of aging. It is not a disease, and it does not cause symptoms. However, if you have osteopenia and continue to lose bone mass, you could develop a condition that causes the bones to become thin and break more easily (osteoporosis). You may also lose some height, have back pain, and have a stooped posture. Although osteopenia is not a disease, making changes to your lifestyle and diet can help to prevent osteopenia from developing into osteoporosis. What are the causes? Osteopenia is caused by loss of calcium in the bones.  Bones are constantly changing. Old bone cells are continually being replaced with new bone cells. This process builds new bone. The mineral calcium is needed to build new bone and maintain bone density. Bone density is usually highest around age 31. After that, most people's bodies cannot replace all the bone they have lost with new bone. What increases the risk? You are more likely to develop this condition if:  You are older than age 71.  You are a woman who went through menopause early.  You have a long illness that keeps you in bed.  You do not get enough exercise.  You lack certain nutrients (malnutrition).  You have an overactive thyroid gland (hyperthyroidism).  You smoke.  You drink a lot of alcohol.  You are taking medicines that weaken the bones, such as steroids. What are the signs or symptoms? This condition does not cause any symptoms. You may have a slightly higher risk for bone breaks (fractures), so getting fractures more easily than normal may be an indication of osteopenia. How is this diagnosed? Your health care  provider can diagnose this condition with a special type of X-ray exam that measures bone density (dual-energy X-ray absorptiometry, DEXA). This test can measure bone density in your hips, spine, and wrists. Osteopenia has no symptoms, so this condition is usually diagnosed after a routine bone density screening test is done for osteoporosis. This routine screening is usually done for:  Women who are age 44 or older.  Men who are age 49 or older. If you have risk factors for osteopenia, you may have the screening test at an earlier age. How is this treated? Making dietary and lifestyle changes can lower your risk for osteoporosis. If you have severe osteopenia that is close to becoming osteoporosis, your health care provider may prescribe medicines and dietary supplements such as calcium and vitamin D. These supplements help to rebuild bone density. Follow these instructions at home:   Take over-the-counter and prescription medicines only as told by your health care provider. These include vitamins and supplements.  Eat a diet that is high in calcium and vitamin  D. ? Calcium is found in dairy products, beans, salmon, and leafy green vegetables like spinach and broccoli. ? Look for foods that have vitamin D and calcium added to them (fortified foods), such as orange juice, cereal, and bread.  Do 30 or more minutes of a weight-bearing exercise every day, such as walking, jogging, or playing a sport. These types of exercises strengthen the bones.  Take precautions at home to lower your risk of falling, such as: ? Keeping rooms well-lit and free of clutter, such as cords. ? Installing safety rails on stairs. ? Using rubber mats in the bathroom or other areas that are often wet or slippery.  Do not use any products that contain nicotine or tobacco, such as cigarettes and e-cigarettes. If you need help quitting, ask your health care provider.  Avoid alcohol or limit alcohol intake to no more  than 1 drink a day for nonpregnant women and 2 drinks a day for men. One drink equals 12 oz of beer, 5 oz of wine, or 1 oz of hard liquor.  Keep all follow-up visits as told by your health care provider. This is important. Contact a health care provider if:  You have not had a bone density screening for osteoporosis and you are: ? A woman, age 75 or older. ? A man, age 60 or older.  You are a postmenopausal woman who has not had a bone density screening for osteoporosis.  You are older than age 22 and you want to know if you should have bone density screening for osteoporosis. Summary  Osteopenia is a loss of thickness (density) inside of the bones. Another name for osteopenia is low bone mass.  Osteopenia is not a disease, but it may increase your risk for a condition that causes the bones to become thin and break more easily (osteoporosis).  You may be at risk for osteopenia if you are older than age 52 or if you are a woman who went through early menopause.  Osteopenia does not cause any symptoms, but it can be diagnosed with a bone density screening test.  Dietary and lifestyle changes are the first treatment for osteopenia. These may lower your risk for osteoporosis. This information is not intended to replace advice given to you by your health care provider. Make sure you discuss any questions you have with your health care provider. Document Revised: 10/23/2017 Document Reviewed: 08/19/2017 Elsevier Patient Education  2020 [place] Maintenance After Age 48 After age 17, you are at a higher risk for certain long-term diseases and infections as well as injuries from falls. Falls are a major cause of broken bones and head injuries in people who are older than age 73. Getting regular preventive care can help to keep you healthy and well. Preventive care includes getting regular testing and making lifestyle changes as recommended by your health care provider. Talk with  your health care provider about:  Which screenings and tests you should have. A screening is a test that checks for a disease when you have no symptoms.  A diet and exercise plan that is right for you. What should I know about screenings and tests to prevent falls? Screening and testing are the best ways to find a health problem early. Early diagnosis and treatment give you the best chance of managing medical conditions that are common after age 41. Certain conditions and lifestyle choices may make you more likely to have a fall. Your health care provider may recommend:  Regular vision checks. Poor vision and conditions such as cataracts can make you more likely to have a fall. If you wear glasses, make sure to get your prescription updated if your vision changes.  Medicine review. Work with your health care provider to regularly review all of the medicines you are taking, including over-the-counter medicines. Ask your health care provider about any side effects that may make you more likely to have a fall. Tell your health care provider if any medicines that you take make you feel dizzy or sleepy.  Osteoporosis screening. Osteoporosis is a condition that causes the bones to get weaker. This can make the bones weak and cause them to break more easily.  Blood pressure screening. Blood pressure changes and medicines to control blood pressure can make you feel dizzy.  Strength and balance checks. Your health care provider may recommend certain tests to check your strength and balance while standing, walking, or changing positions.  Foot health exam. Foot pain and numbness, as well as not wearing proper footwear, can make you more likely to have a fall.  Depression screening. You may be more likely to have a fall if you have a fear of falling, feel emotionally low, or feel unable to do activities that you used to do.  Alcohol use screening. Using too much alcohol can affect your balance and may  make you more likely to have a fall. What actions can I take to lower my risk of falls? General instructions  Talk with your health care provider about your risks for falling. Tell your health care provider if: ? You fall. Be sure to tell your health care provider about all falls, even ones that seem minor. ? You feel dizzy, sleepy, or off-balance.  Take over-the-counter and prescription medicines only as told by your health care provider. These include any supplements.  Eat a healthy diet and maintain a healthy weight. A healthy diet includes low-fat dairy products, low-fat (lean) meats, and fiber from whole grains, beans, and lots of fruits and vegetables. Home safety  Remove any tripping hazards, such as rugs, cords, and clutter.  Install safety equipment such as grab bars in bathrooms and safety rails on stairs.  Keep rooms and walkways well-lit. Activity   Follow a regular exercise program to stay fit. This will help you maintain your balance. Ask your health care provider what types of exercise are appropriate for you.  If you need a cane or walker, use it as recommended by your health care provider.  Wear supportive shoes that have nonskid soles. Lifestyle  Do not drink alcohol if your health care provider tells you not to drink.  If you drink alcohol, limit how much you have: ? 0-1 drink a day for women. ? 0-2 drinks a day for men.  Be aware of how much alcohol is in your drink. In the [place] one drink equals one typical bottle of beer (12 oz), one-half glass of wine (5 oz), or one shot of hard liquor (1 oz).  Do not use any products that contain nicotine or tobacco, such as cigarettes and e-cigarettes. If you need help quitting, ask your health care provider. Summary  Having a healthy lifestyle and getting preventive care can help to protect your health and wellness after age 28.  Screening and testing are the best way to find a health problem early and help you  avoid having a fall. Early diagnosis and treatment give you the best chance for managing medical conditions that  are more common for people who are older than age 45.  Falls are a major cause of broken bones and head injuries in people who are older than age 24. Take precautions to prevent a fall at home.  Work with your health care provider to learn what changes you can make to improve your health and wellness and to prevent falls. This information is not intended to replace advice given to you by your health care provider. Make sure you discuss any questions you have with your health care provider. Document Revised: 03/03/2019 Document Reviewed: 09/23/2017 Elsevier Patient Education  [employer].   If you have lab work done today you will be contacted with your lab results within the next 2 weeks.  If you have not heard from [place] then please contact us. The fastest way to get your results is to register for My Chart.   IF you received an x-ray today, you will receive an invoice from [facility]. Please contact [facility] Radiology at [phone] with questions or concerns regarding your invoice.   IF you received labwork today, you will receive an invoice from [place]. Please contact [facility] at [phone] with questions or concerns regarding your invoice.   Our billing staff will not be able to assist you with questions regarding bills from these companies.  You will be contacted with the lab results as soon as they are available. The fastest way to get your results is to activate your My Chart account. Instructions are located on the last page of this paperwork. If you have not heard from [place] regarding the results in 2 weeks, please contact this office.

## 2020-06-02 LAB — CBC
Hematocrit: 39.6 % (ref 34.0–46.6)
Hemoglobin: 13.1 g/dL (ref 11.1–15.9)
MCH: 29.8 pg (ref 26.6–33.0)
MCHC: 33.1 g/dL (ref 31.5–35.7)
MCV: 90 fL (ref 79–97)
Platelets: 238 10*3/uL (ref 150–450)
RBC: 4.39 x10E6/uL (ref 3.77–5.28)
RDW: 12.7 % (ref 11.7–15.4)
WBC: 4.9 10*3/uL (ref 3.4–10.8)

## 2020-06-02 LAB — COMPREHENSIVE METABOLIC PANEL
ALT: 22 IU/L (ref 0–32)
AST: 26 IU/L (ref 0–40)
Albumin/Globulin Ratio: 3 — ABNORMAL HIGH (ref 1.2–2.2)
Albumin: 5.1 g/dL — ABNORMAL HIGH (ref 3.7–4.7)
Alkaline Phosphatase: 86 IU/L (ref 48–121)
BUN/Creatinine Ratio: 18 (ref 12–28)
BUN: 12 mg/dL (ref 8–27)
Bilirubin Total: 0.7 mg/dL (ref 0.0–1.2)
CO2: 25 mmol/L (ref 20–29)
Calcium: 10.2 mg/dL (ref 8.7–10.3)
Chloride: 103 mmol/L (ref 96–106)
Creatinine, Ser: 0.68 mg/dL (ref 0.57–1.00)
GFR calc Af Amer: 100 mL/min/{1.73_m2} (ref >59)
GFR calc non Af Amer: 87 mL/min/{1.73_m2} (ref >59)
Globulin, Total: 1.7 g/dL (ref 1.5–4.5)
Glucose: 94 mg/dL (ref 65–99)
Potassium: 4.3 mmol/L (ref 3.5–5.2)
Sodium: 144 mmol/L (ref 134–144)
Total Protein: 6.8 g/dL (ref 6.0–8.5)

## 2020-06-02 LAB — TSH: TSH: 1.91 u[IU]/mL (ref 0.450–4.500)

## 2020-06-07 ENCOUNTER — Other Ambulatory Visit: Payer: Self-pay | Admitting: Family Medicine

## 2020-06-07 DIAGNOSIS — E782 Mixed hyperlipidemia: Secondary | ICD-10-CM

## 2020-06-07 DIAGNOSIS — I10 Essential (primary) hypertension: Secondary | ICD-10-CM

## 2020-06-11 DIAGNOSIS — Z20828 Contact with and (suspected) exposure to other viral communicable diseases: Secondary | ICD-10-CM | POA: Diagnosis not present

## 2020-06-13 ENCOUNTER — Other Ambulatory Visit: Payer: Self-pay | Admitting: Family Medicine

## 2020-06-13 DIAGNOSIS — E782 Mixed hyperlipidemia: Secondary | ICD-10-CM

## 2020-06-13 DIAGNOSIS — E039 Hypothyroidism, unspecified: Secondary | ICD-10-CM

## 2020-06-22 ENCOUNTER — Other Ambulatory Visit: Payer: Self-pay

## 2020-06-22 ENCOUNTER — Encounter: Payer: Self-pay | Admitting: Family Medicine

## 2020-06-22 ENCOUNTER — Ambulatory Visit (INDEPENDENT_AMBULATORY_CARE_PROVIDER_SITE_OTHER): Payer: Medicare Other | Admitting: Family Medicine

## 2020-06-22 VITALS — BP 125/86 | HR 66 | Temp 98.2°F | Ht 66.0 in | Wt 155.6 lb

## 2020-06-22 DIAGNOSIS — R5383 Other fatigue: Secondary | ICD-10-CM

## 2020-06-22 DIAGNOSIS — I2510 Atherosclerotic heart disease of native coronary artery without angina pectoris: Secondary | ICD-10-CM

## 2020-06-22 NOTE — Progress Notes (Signed)
Subjective:  Patient ID: Doris Wilkerson, female    DOB: 02-04-1946  Age: 74 y.o. MRN: [id number]  CC:  Chief Complaint  Patient presents with  . Follow-up    x3 weeks fatigue/Labs    HPI Doris Wilkerson presents for   Follow-up from [facility] wellness visit on July 9. Some increased fatigue previous few weeks.  Possible contribution of limitation exercise, plan to follow-up with her orthopedist regarding knee issues.  CBC, BMP, TSH were overall reassuring.  History of CAD, saw cardiology in March.  Previous stent in 2004 for the proximal LAD.  Asymptomatic at March visit.  Fatigue is better. Has adjusted diet, less sugar - feels like this is helpful, and less symptoms.  Occasional 49min nap in day. Cut back on coffee/caffeine.  Going to sleep later - 12am -7am. Staying up later.   No chest pains/palpitations. No DOE. No PND. nocturia once on occasion.   Bone density test 07/23/20. Taking calcium supplement.   History Patient Active Problem List   Diagnosis Date Noted  . Hordeolum externum of left lower eyelid 06/28/2018  . CAD (coronary artery disease) 12/09/2011  . Dyslipidemia 12/09/2011  . Osteopenia 12/09/2011  . Hypothyroid 12/09/2011  . Asthma 12/09/2011   Past Medical History:  Diagnosis Date  . Asthma   . Cataract   . GERD (gastroesophageal reflux disease)   . Hyperlipidemia   . Hypertension   . Thyroid disease    Past Surgical History:  Procedure Laterality Date  . Cataract Surgery    . CORONARY ANGIOPLASTY WITH STENT PLACEMENT    . EYE SURGERY    . ORTHOPEDIC SURGERY  2012  . TONSILLECTOMY  1951   Allergies  Allergen Reactions  . Plavix [Clopidogrel Bisulfate]   . Other     seasonal   Prior to Admission medications   Medication Sig Start Date End Date Taking? Authorizing Provider  aspirin 81 MG tablet Take 81 mg by mouth daily.    Yes Hayden Rasmussen, MD  atorvastatin (LIPITOR) 80 MG tablet Take 1 tablet (80 mg total) by mouth daily at 6 PM.  06/01/20  Yes [name], MD  benazepril (LOTENSIN) 10 MG tablet TAKE 1 TABLET(10 MG) BY MOUTH DAILY 06/01/20  Yes [name], MD  calcium carbonate (OS-CAL) 1250 (500 Ca) MG chewable tablet Chew 1 tablet by mouth daily.   Yes [provider]  DM-APAP-CPM (CORICIDIN HBP PO) Take by mouth.    Yes [provider]  levothyroxine (SYNTHROID) 25 MCG tablet TAKE 1 TABLET(25 MCG) BY MOUTH DAILY BEFORE BREAKFAST 06/01/20  Yes [name], MD  Magnesium 250 MG TABS Take 1 tablet by mouth daily.    Yes [provider]  OVER THE COUNTER MEDICATION Vitamin D 2000iu daily.    Yes [provider]  vitamin B-12 (CYANOCOBALAMIN) 250 MCG tablet Take 250 mcg by mouth daily.   Yes [provider]   Social History   Socioeconomic History  . Marital status: Married    Spouse name: Not on file  . Number of children: 0  . Years of education: Not on file  . Highest education level: Not on file  Occupational History  . Occupation: unemployed  Tobacco Use  . Smoking status: Never Smoker  . Smokeless tobacco: Never Used  Vaping Use  . Vaping Use: Never used  Substance and Sexual Activity  . Alcohol use: Yes    Alcohol/week: 1.0 standard drink    Types: 1 Glasses  of wine per week    Comment: occassionally  . Drug use: No  . Sexual activity: Not Currently  Other Topics Concern  . Not on file  Social History Narrative   Married. Education: college. Pt does exercise-   Social Determinants of Health   Financial Resource Strain:   . Difficulty of Paying Living Expenses:   Food Insecurity:   . Worried About Charity fundraiser in the Last Year:   . Arboriculturist in the Last Year:   Transportation Needs:   . Film/video editor (Medical):   Marland Kitchen Lack of Transportation (Non-Medical):   Physical Activity:   . Days of Exercise per Week:   . Minutes of Exercise per Session:   Stress:   . Feeling of Stress :   Social Connections:   . Frequency  of Communication with Friends and Family:   . Frequency of Social Gatherings with Friends and Family:   . Attends Religious Services:   . Active Member of Clubs or Organizations:   . Attends Archivist Meetings:   Marland Kitchen Marital Status:   Intimate Partner Violence:   . Fear of Current or Ex-Partner:   . Emotionally Abused:   Marland Kitchen Physically Abused:   . Sexually Abused:     Review of Systems   Objective:   Vitals:   06/22/20 0932  BP: (!) 125/86  Pulse: 66  Temp: 98.2 F (36.8 C)  TempSrc: Temporal  SpO2: 98%  Weight: 155 lb 9.6 oz (70.6 kg)  Height: 5\' 6"  (1.676 m)     Physical Exam Vitals reviewed.  Constitutional:      Appearance: She is well-developed.  HENT:     Head: Normocephalic and atraumatic.  Eyes:     Conjunctiva/sclera: Conjunctivae normal.     Pupils: Pupils are equal, round, and reactive to light.  Neck:     Vascular: No carotid bruit.  Cardiovascular:     Rate and Rhythm: Normal rate and regular rhythm.     Heart sounds: Normal heart sounds.  Pulmonary:     Effort: Pulmonary effort is normal.     Breath sounds: Normal breath sounds.  Abdominal:     Palpations: Abdomen is soft. There is no pulsatile mass.     Tenderness: There is no abdominal tenderness.  Skin:    General: Skin is warm and dry.  Neurological:     General: No focal deficit present.     Mental Status: She is alert and oriented to person, place, and time.  Psychiatric:        Mood and Affect: Mood normal.        Behavior: Behavior normal.        Thought Content: Thought content normal.        Assessment & Plan:  Doris Wilkerson is a 74 y.o. female . Fatigue, unspecified type Possibly related to sleep times, diet, caffeine decrease.  Overall symptoms have improved.  Reassuring blood work previously.  Consider earlier sleep time, but as improving hold on further work-up at this time.  If not continuing to improve, with her cardiac history I would recommend she meet  with her cardiologist to determine if other testing needed.  RTC/ER precautions  No orders of the defined types were placed in this encounter.  Patient Instructions   I am glad to hear that fatigue is improving, If fatigue not continuing to improve, I would recommend earlier bedtime initially, but should also meet with Dr. Einar Gip  to make sure he does not think further heart testing would be indicated.   Return to the clinic or go to the nearest emergency room if any of your symptoms worsen or new symptoms occur.   If you have lab work done today you will be contacted with your lab results within the next 2 weeks.  If you have not heard from [place] then please contact us. The fastest way to get your results is to register for My Chart.   IF you received an x-ray today, you will receive an invoice from [facility]. Please contact [facility] at [phone] with questions or concerns regarding your invoice.   IF you received labwork today, you will receive an invoice from [place]. Please contact [facility] at [phone] with questions or concerns regarding your invoice.   Our billing staff will not be able to assist you with questions regarding bills from these companies.  You will be contacted with the lab results as soon as they are available. The fastest way to get your results is to activate your My Chart account. Instructions are located on the last page of this paperwork. If you have not heard from [place] regarding the results in 2 weeks, please contact this office.         Signed, [name], MD [facility]

## 2020-06-22 NOTE — Patient Instructions (Addendum)
I am glad to hear that fatigue is improving, If fatigue not continuing to improve, I would recommend earlier bedtime initially, but should also meet with Dr. Einar Gip to make sure he does not think further heart testing would be indicated.   Return to the clinic or go to the nearest emergency room if any of your symptoms worsen or new symptoms occur.   If you have lab work done today you will be contacted with your lab results within the next 2 weeks.  If you have not heard from [place] then please contact us. The fastest way to get your results is to register for My Chart.   IF you received an x-ray today, you will receive an invoice from [facility]. Please contact [facility] at [phone] with questions or concerns regarding your invoice.   IF you received labwork today, you will receive an invoice from [place]. Please contact [facility] at [phone] with questions or concerns regarding your invoice.   Our billing staff will not be able to assist you with questions regarding bills from these companies.  You will be contacted with the lab results as soon as they are available. The fastest way to get your results is to activate your My Chart account. Instructions are located on the last page of this paperwork. If you have not heard from [place] regarding the results in 2 weeks, please contact this office.

## 2020-07-23 ENCOUNTER — Ambulatory Visit
Admission: RE | Admit: 2020-07-23 | Discharge: 2020-07-23 | Disposition: A | Discharge status: Home / Self Care | Payer: Medicare Other | Source: Ambulatory Visit | Attending: Family Medicine | Admitting: Family Medicine

## 2020-07-23 ENCOUNTER — Other Ambulatory Visit: Payer: Self-pay

## 2020-07-23 DIAGNOSIS — Z780 Asymptomatic menopausal state: Secondary | ICD-10-CM | POA: Diagnosis not present

## 2020-07-23 DIAGNOSIS — M8588 Other specified disorders of bone density and structure, other site: Secondary | ICD-10-CM

## 2020-07-23 DIAGNOSIS — M8589 Other specified disorders of bone density and structure, multiple sites: Secondary | ICD-10-CM | POA: Diagnosis not present

## 2020-07-27 DIAGNOSIS — D225 Melanocytic nevi of trunk: Secondary | ICD-10-CM | POA: Diagnosis not present

## 2020-07-27 DIAGNOSIS — L820 Inflamed seborrheic keratosis: Secondary | ICD-10-CM | POA: Diagnosis not present

## 2020-07-27 DIAGNOSIS — L821 Other seborrheic keratosis: Secondary | ICD-10-CM | POA: Diagnosis not present

## 2020-07-27 DIAGNOSIS — D1801 Hemangioma of skin and subcutaneous tissue: Secondary | ICD-10-CM | POA: Diagnosis not present

## 2020-07-27 DIAGNOSIS — L814 Other melanin hyperpigmentation: Secondary | ICD-10-CM | POA: Diagnosis not present

## 2020-07-31 ENCOUNTER — Ambulatory Visit (INDEPENDENT_AMBULATORY_CARE_PROVIDER_SITE_OTHER): Payer: Medicare Other | Admitting: Registered Nurse

## 2020-07-31 ENCOUNTER — Other Ambulatory Visit: Payer: Self-pay

## 2020-07-31 DIAGNOSIS — Z23 Encounter for immunization: Secondary | ICD-10-CM | POA: Diagnosis not present

## 2020-07-31 NOTE — Patient Instructions (Signed)
° ° ° °  If you have lab work done today you will be contacted with your lab results within the next 2 weeks.  If you have not heard from us then please contact us. The fastest way to get your results is to register for My Chart. ° ° °IF you received an x-ray today, you will receive an invoice from [place] [facility]. Please contact [facility] at [phone] with questions or concerns regarding your invoice.  ° °IF you received labwork today, you will receive an invoice from [facility]. Please contact [facility] at [phone] with questions or concerns regarding your invoice.  ° °Our billing staff will not be able to assist you with questions regarding bills from these companies. ° °You will be contacted with the lab results as soon as they are available. The fastest way to get your results is to activate your My Chart account. Instructions are located on the last page of this paperwork. If you have not heard from us regarding the results in 2 weeks, please contact this office. °  ° ° ° °

## 2020-08-03 ENCOUNTER — Encounter: Payer: Self-pay | Admitting: Family Medicine

## 2020-08-03 ENCOUNTER — Other Ambulatory Visit: Payer: Self-pay

## 2020-08-03 ENCOUNTER — Ambulatory Visit (INDEPENDENT_AMBULATORY_CARE_PROVIDER_SITE_OTHER): Payer: Medicare Other | Admitting: Family Medicine

## 2020-08-03 VITALS — BP 131/68 | HR 83 | Temp 98.2°F | Ht 66.0 in | Wt 156.4 lb

## 2020-08-03 DIAGNOSIS — I2510 Atherosclerotic heart disease of native coronary artery without angina pectoris: Secondary | ICD-10-CM | POA: Diagnosis not present

## 2020-08-03 DIAGNOSIS — R0789 Other chest pain: Secondary | ICD-10-CM

## 2020-08-03 NOTE — Progress Notes (Signed)
Patient ID: Doris Wilkerson, female    DOB: 1946/02/06  Age: 74 y.o. MRN: [id number]  Chief Complaint  Patient presents with  . pain on right side of chest    going wednesday   . Fatigue    this has been going for a few month     Subjective:  Patient has been having some pain in her right side of her chest for the past couple days.  She knows of no major trauma.  She had moves things around a little bit for her housekeeper to be able to clean her study.  She had not been doing any yard work or gardening.  She had not lifted anything major that she does have.  She has developed a soreness in her right chest wall just above the breast.  She says it hurts a little bit when she coughs.  It has been present much of the time though not all the time.  She does have a history of coronary artery disease and has had stenting, and knows what that felt like having cardiac chest pain in the past.  No shortness of breath.  She did just have her flu shot a couple of days earlier.  No arm or axillary pain.   Current allergies, medications, problem list, past/family and social histories reviewed.  Objective:  BP 131/68   Pulse 83   Temp 98.2 F (36.8 C) (Temporal)   Ht 5\' 6"  (1.676 m)   Wt 156 lb 6.4 oz (70.9 kg)   SpO2 99%   BMI 25.24 kg/m  No acute distress.  Alert and oriented.  TM had bothered her a little bit but it looks normal.  Throat was clear.  Neck supple without nodes.  Chest clear to auscultation.  Heart regular without murmur.  No major tenderness on palp patient of the chest wall or pressure to it.  No axillary nodes.  No upper arm tenderness.  Good range of motion and nonpainful.  She says sometimes moving her right arm makes her little bit sore. Assessment & Plan:   Assessment: 1. Chest wall pain       Plan: This is a noncardiac type pain.  I do not feel like any labs or x-rays are indicated.  She had taken some Tylenol, but I suggested taking a NSAID for a few days.  Get  rechecked if something changes.  I think it would just take a long time to heal her.  No orders of the defined types were placed in this encounter.   No orders of the defined types were placed in this encounter.        Patient Instructions    Based on history and physical examination I am very comfortable that this is some kind of a musculoskeletal chest wall pain.  Take naproxen (Aleve) 220 mg 2 pills twice daily at breakfast and dinner for pain and inflammation.  Do not exceed 4 pills in 24 hours.  If you develop stomach upset or heartburn you can discontinue this, otherwise I would recommend continuing it for 4- 5 days.  If needed for additional pain you can take acetaminophen (Tylenol) 500 mg 2 pills 3 times daily.  Do not exceed 6 pills in 24 hours.  Return or go to an urgent care or emergency room if something changes significantly.   If you have lab work done today you will be contacted with your lab results within the next 2 weeks.  If you have not heard  from [place] then please contact us. The fastest way to get your results is to register for My Chart.   IF you received an x-ray today, you will receive an invoice from [facility]. Please contact [facility] at [phone] with questions or concerns regarding your invoice.   IF you received labwork today, you will receive an invoice from [place]. Please contact [facility] at [phone] with questions or concerns regarding your invoice.   Our billing staff will not be able to assist you with questions regarding bills from these companies.  You will be contacted with the lab results as soon as they are available. The fastest way to get your results is to activate your My Chart account. Instructions are located on the last page of this paperwork. If you have not heard from [place] regarding the results in 2 weeks, please contact this office.         Return if symptoms worsen or fail to improve.   Ruben Reason, MD 08/03/2020

## 2020-08-03 NOTE — Patient Instructions (Addendum)
  Based on history and physical examination I am very comfortable that this is some kind of a musculoskeletal chest wall pain.  Take naproxen (Aleve) 220 mg 2 pills twice daily at breakfast and dinner for pain and inflammation.  Do not exceed 4 pills in 24 hours.  If you develop stomach upset or heartburn you can discontinue this, otherwise I would recommend continuing it for 4- 5 days.  If needed for additional pain you can take acetaminophen (Tylenol) 500 mg 2 pills 3 times daily.  Do not exceed 6 pills in 24 hours.  Return or go to an urgent care or emergency room if something changes significantly.   If you have lab work done today you will be contacted with your lab results within the next 2 weeks.  If you have not heard from [place] then please contact us. The fastest way to get your results is to register for My Chart.   IF you received an x-ray today, you will receive an invoice from [facility]. Please contact [facility] at [phone] with questions or concerns regarding your invoice.   IF you received labwork today, you will receive an invoice from [place]. Please contact [facility] at [phone] with questions or concerns regarding your invoice.   Our billing staff will not be able to assist you with questions regarding bills from these companies.  You will be contacted with the lab results as soon as they are available. The fastest way to get your results is to activate your My Chart account. Instructions are located on the last page of this paperwork. If you have not heard from [place] regarding the results in 2 weeks, please contact this office.

## 2020-08-10 ENCOUNTER — Ambulatory Visit (INDEPENDENT_AMBULATORY_CARE_PROVIDER_SITE_OTHER): Payer: Medicare Other | Admitting: Family Medicine

## 2020-08-10 ENCOUNTER — Other Ambulatory Visit: Payer: Self-pay

## 2020-08-10 ENCOUNTER — Encounter: Payer: Self-pay | Admitting: Family Medicine

## 2020-08-10 VITALS — BP 112/73 | HR 89 | Temp 98.5°F | Ht 66.0 in | Wt 155.2 lb

## 2020-08-10 DIAGNOSIS — R0789 Other chest pain: Secondary | ICD-10-CM | POA: Diagnosis not present

## 2020-08-10 NOTE — Patient Instructions (Signed)
° ° ° °  If you have lab work done today you will be contacted with your lab results within the next 2 weeks.  If you have not heard from us then please contact us. The fastest way to get your results is to register for My Chart. ° ° °IF you received an x-ray today, you will receive an invoice from [facility]. Please contact [place] [facility] at [phone] with questions or concerns regarding your invoice.  ° °IF you received labwork today, you will receive an invoice from [facility]. Please contact [facility] at [phone] with questions or concerns regarding your invoice.  ° °Our billing staff will not be able to assist you with questions regarding bills from these companies. ° °You will be contacted with the lab results as soon as they are available. The fastest way to get your results is to activate your My Chart account. Instructions are located on the last page of this paperwork. If you have not heard from us regarding the results in 2 weeks, please contact this office. °  ° ° ° °

## 2020-08-10 NOTE — Progress Notes (Signed)
9/17/20211:28 PM  Doris Wilkerson 1946/11/01, 74 y.o., female [id number]  Chief Complaint  Patient presents with  . Follow-up    1 wk follow up for chest wall pain right side, ruled out cardio yet it has moved, more muscular pain. not as much pain as she was last wk. Also beleives pulse is high    HPI:   Patient is a 74 y.o. female  who presents today for followup on chest wall pain  Seen by Dr Linna Darner on sept 10 2021 for same concern  She reports that right sided chest wall pain She has been using APAP (avoids nsaids 2/2 CAD) She reports pain is worse with movement Seems to be getting better   Depression screen [facility] 2/9 08/03/2020 06/22/2020 06/01/2020  Decreased Interest 0 0 0  Down, Depressed, Hopeless 0 0 0  PHQ - 2 Score 0 0 0    Fall Risk  08/03/2020 06/22/2020 06/01/2020 11/30/2019 11/21/2019  Falls in the past year? 0 0 0 0 0  Number falls in past yr: 0 0 - 0 0  Injury with Fall? 0 0 - 0 0  Follow up Falls evaluation completed Falls evaluation completed Falls evaluation completed Falls evaluation completed Falls evaluation completed;Education provided     Allergies  Allergen Reactions  . Poison Ivy Extract Swelling  . Plavix [Clopidogrel Bisulfate]   . Other     seasonal    Prior to Admission medications   Medication Sig Start Date End Date Taking? Authorizing Provider  aspirin 81 MG tablet Take 81 mg by mouth daily.    Yes Hayden Rasmussen, MD  atorvastatin (LIPITOR) 80 MG tablet Take 1 tablet (80 mg total) by mouth daily at 6 PM. 06/01/20  Yes [name], MD  benazepril (LOTENSIN) 10 MG tablet TAKE 1 TABLET(10 MG) BY MOUTH DAILY 06/01/20  Yes [name], MD  calcium carbonate (OS-CAL) 1250 (500 Ca) MG chewable tablet Chew 1 tablet by mouth daily.   Yes [provider]  DM-APAP-CPM (CORICIDIN HBP PO) Take by mouth.    Yes [provider]  levothyroxine (SYNTHROID) 25 MCG tablet TAKE 1 TABLET(25 MCG) BY MOUTH DAILY BEFORE BREAKFAST  06/01/20  Yes [name], MD  Magnesium 250 MG TABS Take 1 tablet by mouth daily.    Yes [provider]  OVER THE COUNTER MEDICATION Vitamin D 2000iu daily.    Yes [provider]  vitamin B-12 (CYANOCOBALAMIN) 250 MCG tablet Take 250 mcg by mouth daily.   Yes [provider]    Past Medical History:  Diagnosis Date  . Asthma   . Cataract   . GERD (gastroesophageal reflux disease)   . Hyperlipidemia   . Hypertension   . Thyroid disease     Past Surgical History:  Procedure Laterality Date  . Cataract Surgery    . CORONARY ANGIOPLASTY WITH STENT PLACEMENT    . EYE SURGERY    . ORTHOPEDIC SURGERY  2012  . TONSILLECTOMY  1951    Social History   Tobacco Use  . Smoking status: Never Smoker  . Smokeless tobacco: Never Used  Substance Use Topics  . Alcohol use: Yes    Alcohol/week: 1.0 standard drink    Types: 1 Glasses of wine per week    Comment: occassionally    Family History  Problem Relation Age of Onset  . Cancer Father   . Heart disease Brother   . Dementia Brother   . Breast cancer Neg  Hx   . Colon cancer Neg Hx   . Esophageal cancer Neg Hx   . Pancreatic cancer Neg Hx   . Rectal cancer Neg Hx   . Stomach cancer Neg Hx     ROS Per hpi  OBJECTIVE:  Today's Vitals   08/10/20 1320  BP: 112/73  Pulse: 89  Temp: 98.5 F (36.9 C)  SpO2: 97%  Weight: 155 lb 3.2 oz (70.4 kg)  Height: 5\' 6"  (1.676 m)   Body mass index is 25.05 kg/m.   Physical Exam Vitals and nursing note reviewed.  Constitutional:      Appearance: She is well-developed.  HENT:     Head: Normocephalic and atraumatic.     Mouth/Throat:     Pharynx: No oropharyngeal exudate.  Eyes:     General: No scleral icterus.    Extraocular Movements: Extraocular movements intact.     Conjunctiva/sclera: Conjunctivae normal.     Pupils: Pupils are equal, round, and reactive to light.  Cardiovascular:     Rate and Rhythm: Normal rate and regular rhythm.      Heart sounds: Normal heart sounds. No murmur heard.  No friction rub. No gallop.   Pulmonary:     Effort: Pulmonary effort is normal.     Breath sounds: Normal breath sounds. No wheezing, rhonchi or rales.  Chest:     Chest wall: Tenderness present. No mass or swelling.    Musculoskeletal:     Cervical back: Neck supple.  Skin:    General: Skin is warm and dry.  Neurological:     Mental Status: She is alert and oriented to person, place, and time.     No results found for this or any previous visit (from the past 24 hour(s)).  No results found.   ASSESSMENT and PLAN  1. Chest wall pain Continue with supportive measures, ice, rest, APAP  No follow-ups on file.    Rutherford Guys, MD [facility], [place] [address] Ph.  [phone] Fax [phone]

## 2020-08-17 ENCOUNTER — Ambulatory Visit: Payer: Medicare Other | Admitting: Family Medicine

## 2020-11-29 ENCOUNTER — Encounter: Payer: Self-pay | Admitting: Family Medicine

## 2020-11-29 DIAGNOSIS — Z20822 Contact with and (suspected) exposure to covid-19: Secondary | ICD-10-CM | POA: Diagnosis not present

## 2020-12-06 ENCOUNTER — Ambulatory Visit (INDEPENDENT_AMBULATORY_CARE_PROVIDER_SITE_OTHER): Payer: Medicare Other | Admitting: Family Medicine

## 2020-12-06 ENCOUNTER — Other Ambulatory Visit: Payer: Self-pay

## 2020-12-06 ENCOUNTER — Encounter: Payer: Self-pay | Admitting: Family Medicine

## 2020-12-06 ENCOUNTER — Ambulatory Visit (INDEPENDENT_AMBULATORY_CARE_PROVIDER_SITE_OTHER): Payer: Medicare Other

## 2020-12-06 VITALS — BP 126/80 | HR 77 | Temp 99.4°F | Ht 66.0 in | Wt 154.0 lb

## 2020-12-06 DIAGNOSIS — R42 Dizziness and giddiness: Secondary | ICD-10-CM | POA: Diagnosis not present

## 2020-12-06 DIAGNOSIS — I2510 Atherosclerotic heart disease of native coronary artery without angina pectoris: Secondary | ICD-10-CM | POA: Diagnosis not present

## 2020-12-06 DIAGNOSIS — M25552 Pain in left hip: Secondary | ICD-10-CM

## 2020-12-06 LAB — CBC
Hematocrit: 36.2 % (ref 34.0–46.6)
Hemoglobin: 12 g/dL (ref 11.1–15.9)
MCH: 29.5 pg (ref 26.6–33.0)
MCHC: 33.1 g/dL (ref 31.5–35.7)
MCV: 89 fL (ref 79–97)
Platelets: 248 10*3/uL (ref 150–450)
RBC: 4.07 x10E6/uL (ref 3.77–5.28)
RDW: 13.2 % (ref 11.7–15.4)
WBC: 5.2 10*3/uL (ref 3.4–10.8)

## 2020-12-06 LAB — BASIC METABOLIC PANEL
BUN/Creatinine Ratio: 18 (ref 12–28)
BUN: 14 mg/dL (ref 8–27)
CO2: 24 mmol/L (ref 20–29)
Calcium: 10.4 mg/dL — ABNORMAL HIGH (ref 8.7–10.3)
Chloride: 103 mmol/L (ref 96–106)
Creatinine, Ser: 0.79 mg/dL (ref 0.57–1.00)
GFR calc Af Amer: 85 mL/min/{1.73_m2} (ref >59)
GFR calc non Af Amer: 74 mL/min/{1.73_m2} (ref >59)
Glucose: 97 mg/dL (ref 65–99)
Potassium: 4.4 mmol/L (ref 3.5–5.2)
Sodium: 140 mmol/L (ref 134–144)

## 2020-12-06 MED ORDER — MECLIZINE HCL 25 MG PO TABS: 25.0000 mg | ORAL_TABLET | Freq: Three times a day (TID) | ORAL | 0 refills | Status: DC | PRN

## 2020-12-06 NOTE — Progress Notes (Signed)
Subjective:  Patient ID: Doris Wilkerson, female    DOB: 07/13/1946  Age: 75 y.o. MRN: [id number]  CC:  Chief Complaint  Patient presents with  . Dizziness    Pt reports at 2am on Monday the pt woke to urinate and felt dizzy, nasus, some fullness in her ears more so on the R side, and spinning sensation. Pt reports this has been the only episode other than some disorientation the next day.    HPI [name] presents for   Dizziness: Notes 3 days ago. Woke up to use restroom at 2am. Noticed onset of symptoms when turning to sit on side of bed. sensation of movement/spinning. No syncope/nearsyncope. Did not wake up dizzy. No chest pain, no HA. No focal weakness, slurred speech or facial droop.  Felt fullness in ears R>L still slightly there at times, spinning sensation, nausea, no vomiting. No focal weakness. No HA. No change in hearing. No ear pain/drainage.  No prior vertigo.  No recent travel, no recent URI.  Went back to sleep.  Felt slightly disoriented that day, but better as the day went on.  Feeling better, but still not back to normal. Some fatigue, but no further dizziness.  At times feels like blood pumping in head when standing up. No chest pain/palpitations.   NI:OEVO other than 1 coricidin one night. Not sure why. No cough.   Eating and drinking ok.   At end of visit - states she has been more off balance at times  Past month. No falls, just feels like needs to be more cautious. No focal weakness. Just possibly more balance issues. Has had some soreness in left buttocks and into left groin. Favoring left leg, adjusting dressing routine. Feels swollen in area under buttocks, no rash, d/c.    Lab Results  Component Value Date   WBC 4.9 06/01/2020   HGB 13.1 06/01/2020   HCT 39.6 06/01/2020   MCV 90 06/01/2020   PLT 238 06/01/2020   Orthostatic VS for the past 24 hrs (Last 3 readings):  BP- Lying Pulse- Lying BP- Sitting Pulse- Sitting BP- Standing at 0  minutes Pulse- Standing at 0 minutes BP- Standing at 3 minutes Pulse- Standing at 3 minutes  12/06/20 0940 124/70 64 140/72 71 138/80 81 130/78 87    History Patient Active Problem List   Diagnosis Date Noted  . Hordeolum externum of left lower eyelid 06/28/2018  . CAD (coronary artery disease) 12/09/2011  . Dyslipidemia 12/09/2011  . Osteopenia 12/09/2011  . Hypothyroid 12/09/2011  . Asthma 12/09/2011   Past Medical History:  Diagnosis Date  . Asthma   . Cataract   . GERD (gastroesophageal reflux disease)   . Hyperlipidemia   . Hypertension   . Thyroid disease    Past Surgical History:  Procedure Laterality Date  . Cataract Surgery    . CORONARY ANGIOPLASTY WITH STENT PLACEMENT    . EYE SURGERY    . ORTHOPEDIC SURGERY  2012  . TONSILLECTOMY  1951   Allergies  Allergen Reactions  . Poison Ivy Extract Swelling  . Plavix [Clopidogrel Bisulfate]   . Other     seasonal   Prior to Admission medications   Medication Sig Start Date End Date Taking? Authorizing Provider  aspirin 81 MG tablet Take 81 mg by mouth daily.    Yes Hayden Rasmussen, MD  atorvastatin (LIPITOR) 80 MG tablet Take 1 tablet (80 mg total) by mouth daily at 6 PM. 06/01/20  Yes [name],  Ranell Patrick, MD  benazepril (LOTENSIN) 10 MG tablet TAKE 1 TABLET(10 MG) BY MOUTH DAILY 06/01/20  Yes [name], MD  calcium carbonate (OS-CAL) 1250 (500 Ca) MG chewable tablet Chew 1 tablet by mouth daily.   Yes [provider]  DM-APAP-CPM (CORICIDIN HBP PO) Take by mouth.    Yes [provider]  levothyroxine (SYNTHROID) 25 MCG tablet TAKE 1 TABLET(25 MCG) BY MOUTH DAILY BEFORE BREAKFAST 06/01/20  Yes [name], MD  OVER THE COUNTER MEDICATION Vitamin D 2000iu daily.    Yes [provider]  vitamin B-12 (CYANOCOBALAMIN) 250 MCG tablet Take 250 mcg by mouth daily.   Yes [provider]  Magnesium 250 MG TABS Take 1 tablet by mouth daily.  Patient not taking: Reported on 12/06/2020     [provider]   Social History   Socioeconomic History  . Marital status: Married    Spouse name: Not on file  . Number of children: 0  . Years of education: Not on file  . Highest education level: Not on file  Occupational History  . Occupation: unemployed  Tobacco Use  . Smoking status: Never Smoker  . Smokeless tobacco: Never Used  Vaping Use  . Vaping Use: Never used  Substance and Sexual Activity  . Alcohol use: Yes    Alcohol/week: 1.0 standard drink    Types: 1 Glasses of wine per week    Comment: occassionally  . Drug use: No  . Sexual activity: Not Currently  Other Topics Concern  . Not on file  Social History Narrative   Married. Education: college. Pt does exercise-   Social Determinants of Health   Financial Resource Strain: Not on file  Food Insecurity: Not on file  Transportation Needs: Not on file  Physical Activity: Not on file  Stress: Not on file  Social Connections: Not on file  Intimate Partner Violence: Not on file    Review of Systems   Objective:   Vitals:   12/06/20 0926  BP: 126/80  Pulse: 77  Temp: 99.4 F (37.4 C)  TempSrc: Temporal  SpO2: 99%  Weight: 154 lb (69.9 kg)  Height: 5\' 6"  (1.676 m)     Physical Exam Vitals reviewed.  Constitutional:      Appearance: She is well-developed and well-nourished.  HENT:     Head: Normocephalic and atraumatic.     Right Ear: Tympanic membrane, ear canal and external ear normal.     Left Ear: Tympanic membrane and ear canal normal.  Eyes:     Extraocular Movements: EOM normal.     Conjunctiva/sclera: Conjunctivae normal.     Pupils: Pupils are equal, round, and reactive to light.     Comments: PERRL, EOMI.  1-2 beats of nystagmus with supine, however increased nystagmus, fast beating left with supine to sitting with some reproduction of vertiginous symptoms.  Neck:     Vascular: No carotid bruit.  Cardiovascular:     Rate and Rhythm: Normal rate and regular rhythm.      Pulses: Intact distal pulses.     Heart sounds: Normal heart sounds.  Pulmonary:     Effort: Pulmonary effort is normal.     Breath sounds: Normal breath sounds.  Abdominal:     Palpations: Abdomen is soft. There is no pulsatile mass.     Tenderness: There is no abdominal tenderness.  Musculoskeletal:     Comments: Reports that her discomfort at her left internal groin and posterior buttocks near  the ischium.  Tender to palpation over ischium, no rash, no wounds, no focal swelling or mass appreciated.  Female assistant Guadelupe Sabin present for exam. Minimal discomfort with active internal rotation of left hip, but pain-free internal and external rotation on my testing.  Intact range of motion.  Skin:    General: Skin is warm and dry.  Neurological:     General: No focal deficit present.     Mental Status: She is alert and oriented to person, place, and time.     GCS: GCS eye subscore is 4. GCS verbal subscore is 5. GCS motor subscore is 6.     Cranial Nerves: No facial asymmetry.     Motor: Motor function is intact. No pronator drift.     Coordination: Coordination is intact. Romberg sign negative.     Gait: Gait is intact. Gait normal.     Comments: No focal weakness, ambulated in hallway without apparent difficulty.  Nonfocal exam.  Psychiatric:        Mood and Affect: Mood and affect normal.        Behavior: Behavior normal.    38 minutes spent during visit, greater than 50% counseling and assimilation of information, chart review, and discussion of plan.   EKG sinus rhythm, left anterior fascicular block.  Compared to 02/06/2020, no apparent acute findings or significant changes.  DG HIP UNILAT W OR W/O PELVIS 2-3 VIEWS LEFT  Result Date: 12/06/2020 CLINICAL DATA:  Left hip pain without known injury. EXAM: DG HIP (WITH OR WITHOUT PELVIS) 2-3V LEFT COMPARISON:  None. FINDINGS: There is no evidence of hip fracture or dislocation. There is no evidence of arthropathy or other focal bone  abnormality. IMPRESSION: Negative. Electronically Signed   By: Marijo Conception M.D.   On: 12/06/2020 10:43     Assessment & Plan:  Paylin Heavin is a 75 y.o. female . Vertigo - Plan: meclizine (ANTIVERT) 25 MG tablet  Episode of dizziness - Plan: meclizine (ANTIVERT) 25 MG tablet, CBC, Basic metabolic panel, EKG [id number]  Coronary artery disease involving native coronary artery of native heart without angina pectoris - Plan: EKG 12-Lead  Left-sided ischial pain - Plan: DG HIP UNILAT W OR W/O PELVIS 2-3 VIEWS LEFT  Left hip pain - Plan: DG HIP UNILAT W OR W/O PELVIS 2-3 VIEWS LEFT recent episode of dizziness likely peripheral vertigo.  No focal weakness appreciated on exam, nonfocal neurological exam.  Reassuring EKG.  Check CBC, BMP, but anticipate those to be normal.  No cardiac symptoms and reassuring EKG.  Denies change in hearing or ear pain.   Symptoms have improved.  -Continue symptomatic care monitoring, meclizine as option with RTC/ER precautions.  -Slight imbalance/favoring of left hip possibly due to muscular issue.  Imaging obtained and has planned follow-up.  ER precautions if acute worsening.  Meds ordered this encounter  Medications  . meclizine (ANTIVERT) 25 MG tablet    Sig: Take 1 tablet (25 mg total) by mouth 3 (three) times daily as needed for dizziness.    Dispense:  30 tablet    Refill:  0   Patient Instructions    I suspect your symptoms are due to vertigo/peripheral vertigo and should continue to improve. Make sure to continue to drink fluids, meclizine if needed.  If any worsening or not continuing to improve in next week I would recommend further evaluation. Follow up here or urgent care/ER if worse symptoms.   Return to the clinic or go to the nearest  emergency room if any of your symptoms worsen or new symptoms occur.  I will check x-ray of the pelvis and hip to see if there may be arthritis or explanation for some of her discomfort and favoring of that  side.  Keep follow-up in the next 2 weeks to discuss that further.  If you have any new weakness or changes during that time be seen as discussed above.  Thanks for coming in today and take care.   Vertigo Vertigo is the feeling that you or your surroundings are moving when they are not. This feeling can come and go at any time. Vertigo often goes away on its own. Vertigo can be dangerous if it occurs while you are doing something that could endanger you or others, such as driving or operating machinery. Your health care provider will do tests to determine the cause of your vertigo. Tests will also help your health care provider decide how best to treat your condition. Follow these instructions at home: Eating and drinking  Drink enough fluid to keep your urine pale yellow.  Do not drink alcohol.      Activity  Return to your normal activities as told by your health care provider. Ask your health care provider what activities are safe for you.  In the morning, first sit up on the side of the bed. When you feel okay, stand slowly while you hold onto something until you know that your balance is fine.  Move slowly. Avoid sudden body or head movements or certain positions, as told by your health care provider.  If you have trouble walking or keeping your balance, try using a cane for stability. If you feel dizzy or unstable, sit down right away.  Avoid doing any tasks that would cause danger to you or others if vertigo occurs.  Avoid bending down if you feel dizzy. Place items in your home so that they are easy for you to reach without leaning over.  Do not drive or use heavy machinery if you feel dizzy. General instructions  Take over-the-counter and prescription medicines only as told by your health care provider.  Keep all follow-up visits as told by your health care provider. This is important. Contact a health care provider if:  Your medicines do not relieve your vertigo or they  make it worse.  You have a fever.  Your condition gets worse or you develop new symptoms.  Your family or friends notice any behavioral changes.  Your nausea or vomiting gets worse.  You have numbness or a prickling and tingling sensation in part of your body. Get help right away if you:  Have difficulty moving or speaking.  Are always dizzy.  Faint.  Develop severe headaches.  Have weakness in your hands, arms, or legs.  Have changes in your hearing or vision.  Develop a stiff neck.  Develop sensitivity to light. Summary  Vertigo is the feeling that you or your surroundings are moving when they are not.  Your health care provider will do tests to determine the cause of your vertigo.  Follow instructions for home care. You may be told to avoid certain tasks, positions, or movements.  Contact a health care provider if your medicines do not relieve your symptoms, or if you have a fever, nausea, vomiting, or changes in behavior.  Get help right away if you have severe headaches or difficulty speaking, or you develop hearing or vision problems. This information is not intended to replace advice given  to you by your health care provider. Make sure you discuss any questions you have with your health care provider. Document Revised: 10/04/2018 Document Reviewed: 10/04/2018 Elsevier Patient Education  2021 [employer].   If you have lab work done today you will be contacted with your lab results within the next 2 weeks.  If you have not heard from [place] then please contact us. The fastest way to get your results is to register for My Chart.   IF you received an x-ray today, you will receive an invoice from [facility]. Please contact [facility] at [phone] with questions or concerns regarding your invoice.   IF you received labwork today, you will receive an invoice from [facility]. Please contact [facility] at [phone] with questions or concerns  regarding your invoice.   Our billing staff will not be able to assist you with questions regarding bills from these companies.  You will be contacted with the lab results as soon as they are available. The fastest way to get your results is to activate your My Chart account. Instructions are located on the last page of this paperwork. If you have not heard from [place] regarding the results in 2 weeks, please contact this office.         Signed, [name], MD [facility]

## 2020-12-06 NOTE — Patient Instructions (Addendum)
I suspect your symptoms are due to vertigo/peripheral vertigo and should continue to improve. Make sure to continue to drink fluids, meclizine if needed.  If any worsening or not continuing to improve in next week I would recommend further evaluation. Follow up here or urgent care/ER if worse symptoms.   Return to the clinic or go to the nearest emergency room if any of your symptoms worsen or new symptoms occur.  I will check x-ray of the pelvis and hip to see if there may be arthritis or explanation for some of her discomfort and favoring of that side.  Keep follow-up in the next 2 weeks to discuss that further.  If you have any new weakness or changes during that time be seen as discussed above.  Thanks for coming in today and take care.   Vertigo Vertigo is the feeling that you or your surroundings are moving when they are not. This feeling can come and go at any time. Vertigo often goes away on its own. Vertigo can be dangerous if it occurs while you are doing something that could endanger you or others, such as driving or operating machinery. Your health care provider will do tests to determine the cause of your vertigo. Tests will also help your health care provider decide how best to treat your condition. Follow these instructions at home: Eating and drinking  Drink enough fluid to keep your urine pale yellow.  Do not drink alcohol.      Activity  Return to your normal activities as told by your health care provider. Ask your health care provider what activities are safe for you.  In the morning, first sit up on the side of the bed. When you feel okay, stand slowly while you hold onto something until you know that your balance is fine.  Move slowly. Avoid sudden body or head movements or certain positions, as told by your health care provider.  If you have trouble walking or keeping your balance, try using a cane for stability. If you feel dizzy or unstable, sit down right  away.  Avoid doing any tasks that would cause danger to you or others if vertigo occurs.  Avoid bending down if you feel dizzy. Place items in your home so that they are easy for you to reach without leaning over.  Do not drive or use heavy machinery if you feel dizzy. General instructions  Take over-the-counter and prescription medicines only as told by your health care provider.  Keep all follow-up visits as told by your health care provider. This is important. Contact a health care provider if:  Your medicines do not relieve your vertigo or they make it worse.  You have a fever.  Your condition gets worse or you develop new symptoms.  Your family or friends notice any behavioral changes.  Your nausea or vomiting gets worse.  You have numbness or a prickling and tingling sensation in part of your body. Get help right away if you:  Have difficulty moving or speaking.  Are always dizzy.  Faint.  Develop severe headaches.  Have weakness in your hands, arms, or legs.  Have changes in your hearing or vision.  Develop a stiff neck.  Develop sensitivity to light. Summary  Vertigo is the feeling that you or your surroundings are moving when they are not.  Your health care provider will do tests to determine the cause of your vertigo.  Follow instructions for home care. You may be told to avoid  certain tasks, positions, or movements.  Contact a health care provider if your medicines do not relieve your symptoms, or if you have a fever, nausea, vomiting, or changes in behavior.  Get help right away if you have severe headaches or difficulty speaking, or you develop hearing or vision problems. This information is not intended to replace advice given to you by your health care provider. Make sure you discuss any questions you have with your health care provider. Document Revised: 10/04/2018 Document Reviewed: 10/04/2018 Elsevier Patient Education  2021 [employer].   If you have lab work done today you will be contacted with your lab results within the next 2 weeks.  If you have not heard from [place] then please contact us. The fastest way to get your results is to register for My Chart.   IF you received an x-ray today, you will receive an invoice from [facility]. Please contact [facility] at [phone] with questions or concerns regarding your invoice.   IF you received labwork today, you will receive an invoice from [facility]. Please contact [facility] at [phone] with questions or concerns regarding your invoice.   Our billing staff will not be able to assist you with questions regarding bills from these companies.  You will be contacted with the lab results as soon as they are available. The fastest way to get your results is to activate your My Chart account. Instructions are located on the last page of this paperwork. If you have not heard from [place] regarding the results in 2 weeks, please contact this office.

## 2020-12-13 ENCOUNTER — Encounter: Payer: Self-pay | Admitting: Family Medicine

## 2020-12-14 NOTE — Telephone Encounter (Signed)
Pt FYI note for consideration prior to her appointment thursday

## 2020-12-20 ENCOUNTER — Encounter: Payer: Self-pay | Admitting: Family Medicine

## 2020-12-20 ENCOUNTER — Ambulatory Visit (INDEPENDENT_AMBULATORY_CARE_PROVIDER_SITE_OTHER): Payer: Medicare Other | Admitting: Family Medicine

## 2020-12-20 ENCOUNTER — Other Ambulatory Visit: Payer: Self-pay

## 2020-12-20 VITALS — BP 120/75 | HR 84 | Temp 98.6°F | Ht 66.0 in | Wt 152.0 lb

## 2020-12-20 DIAGNOSIS — I2510 Atherosclerotic heart disease of native coronary artery without angina pectoris: Secondary | ICD-10-CM | POA: Diagnosis not present

## 2020-12-20 DIAGNOSIS — S76302D Unspecified injury of muscle, fascia and tendon of the posterior muscle group at thigh level, left thigh, subsequent encounter: Secondary | ICD-10-CM | POA: Diagnosis not present

## 2020-12-20 DIAGNOSIS — R42 Dizziness and giddiness: Secondary | ICD-10-CM

## 2020-12-20 MED ORDER — MECLIZINE HCL 25 MG PO TABS: 25.0000 mg | ORAL_TABLET | Freq: Three times a day (TID) | ORAL | 0 refills | Status: DC | PRN

## 2020-12-20 NOTE — Progress Notes (Signed)
Subjective:  Patient ID: Doris Wilkerson, female    DOB: 02-Jan-1946  Age: 75 y.o. MRN: [id number]  CC:  Chief Complaint  Patient presents with  . Muscle Pain    Pt reports yesterday after a sudden turn the pt heard and felt a pop in the back of her L thigh almost into the buttocks area. Pt states the pain at the time was really bad. Pt reports weakness in the L leg now. Pt states she can't do stairs and sitting sometimes causes pain. Pt does state since the pop her L hip hasn't hurt as much and she is sleeping better.    HPI Doris Wilkerson presents for   L hip pain: Noted on mychart message 11/29/20 - 2-3 months of pain.  Noted left buttock/groin soreness in office 12/06/20. Favoring that leg.  Negative hip XR last visit.  Pain behind buttocks worsened. Trouble with sleeping initially, but pain was improving with ice pack, tylenol tid. Was continuing to take statin daily.   Felt good yesterday, busy, driving.  putting paper in car, shopping cart moved, she pivoted to the right,  Felt pop and pain in same area on left leg. Able to ambulate, but limping. Painful last night, then pain improved after ice. Less pain last night than past few weeks. Having to use cane today. Pain 5/10, still better after pop, but sore on sit bone. No front radiation. Hurts to step up, using cane.   Vertigo: Feeling better since last visit. Improved with meclizine. Tapering to am dosing.  History Patient Active Problem List   Diagnosis Date Noted  . Hordeolum externum of left lower eyelid 06/28/2018  . CAD (coronary artery disease) 12/09/2011  . Dyslipidemia 12/09/2011  . Osteopenia 12/09/2011  . Hypothyroid 12/09/2011  . Asthma 12/09/2011   Past Medical History:  Diagnosis Date  . Asthma   . Cataract   . GERD (gastroesophageal reflux disease)   . Hyperlipidemia   . Hypertension   . Thyroid disease    Past Surgical History:  Procedure Laterality Date  . Cataract Surgery    . CORONARY  ANGIOPLASTY WITH STENT PLACEMENT    . EYE SURGERY    . ORTHOPEDIC SURGERY  2012  . TONSILLECTOMY  1951   Allergies  Allergen Reactions  . Poison Ivy Extract Swelling  . Plavix [Clopidogrel Bisulfate]   . Other     seasonal   Prior to Admission medications   Medication Sig Start Date End Date Taking? Authorizing Provider  aspirin 81 MG tablet Take 81 mg by mouth daily.    Yes Hayden Rasmussen, MD  atorvastatin (LIPITOR) 80 MG tablet Take 1 tablet (80 mg total) by mouth daily at 6 PM. 06/01/20  Yes [name], MD  benazepril (LOTENSIN) 10 MG tablet TAKE 1 TABLET(10 MG) BY MOUTH DAILY 06/01/20  Yes [name], MD  calcium carbonate (OS-CAL) 1250 (500 Ca) MG chewable tablet Chew 1 tablet by mouth daily.   Yes [provider]  DM-APAP-CPM (CORICIDIN HBP PO) Take by mouth.    Yes [provider]  levothyroxine (SYNTHROID) 25 MCG tablet TAKE 1 TABLET(25 MCG) BY MOUTH DAILY BEFORE BREAKFAST 06/01/20  Yes [name], MD  meclizine (ANTIVERT) 25 MG tablet Take 1 tablet (25 mg total) by mouth 3 (three) times daily as needed for dizziness. 12/06/20  Yes [name], MD  OVER THE COUNTER MEDICATION Vitamin D 2000iu daily.    Yes [provider]  vitamin  B-12 (CYANOCOBALAMIN) 250 MCG tablet Take 250 mcg by mouth daily.   Yes [provider]  Magnesium 250 MG TABS Take 1 tablet by mouth daily.    [provider]   Social History   Socioeconomic History  . Marital status: Married    Spouse name: Not on file  . Number of children: 0  . Years of education: Not on file  . Highest education level: Not on file  Occupational History  . Occupation: unemployed  Tobacco Use  . Smoking status: Never Smoker  . Smokeless tobacco: Never Used  Vaping Use  . Vaping Use: Never used  Substance and Sexual Activity  . Alcohol use: Yes    Alcohol/week: 1.0 standard drink    Types: 1 Glasses of wine per week    Comment: occassionally  . Drug  use: No  . Sexual activity: Not Currently  Other Topics Concern  . Not on file  Social History Narrative   Married. Education: college. Pt does exercise-   Social Determinants of Health   Financial Resource Strain: Not on file  Food Insecurity: Not on file  Transportation Needs: Not on file  Physical Activity: Not on file  Stress: Not on file  Social Connections: Not on file  Intimate Partner Violence: Not on file    Review of Systems Per HPI.   Objective:   Vitals:   12/20/20 1548  BP: 120/75  Pulse: 84  Temp: 98.6 F (37 C)  TempSrc: Temporal  SpO2: 98%  Weight: 152 lb (68.9 kg)  Height: 5\' 6"  (1.676 m)     Physical Exam Constitutional:      General: She is not in acute distress.    Appearance: She is well-developed and well-nourished.  HENT:     Head: Normocephalic and atraumatic.  Cardiovascular:     Rate and Rhythm: Normal rate.  Pulmonary:     Effort: Pulmonary effort is normal.  Musculoskeletal:     Comments: Lumbar spine nontender,  SI joints nontender Pain-free hip internal and external rotation Slight tenderness at ischium, primarily ttp just distal at the proximal semitendinosus, toward abductor magnus.  No palpable defect, no ecchymosis. Able to resist knee flexion with reproducible discomfort. Discomfort with terminal knee flexion into posterior upper thigh.  Antalgic gait.  Knee, lower leg nontender.  Neurological:     Mental Status: She is alert and oriented to person, place, and time.  Psychiatric:        Mood and Affect: Mood and affect normal.        Assessment & Plan:  Doris Wilkerson is a 75 y.o. female . Left hamstring injury, subsequent encounter - Plan: Ambulatory referral to Sports Medicine  -Potential tight hamstring, with recent Pap, possible hamstring strain versus avulsion.  Imaging discussed, but plan for sports med eval with MSK ultrasound likely initially, option of MRI.  Able to weight-bear with use of cane, crutches  declined.  Pain control with Tylenol, declined further medication.  RTC precautions.  Vertigo - Plan: meclizine (ANTIVERT) 25 MG tablet Episode of dizziness - Plan: meclizine (ANTIVERT) 25 MG tablet  -Improved, continue to taper meclizine as tolerated.  RTC precautions  Meds ordered this encounter  Medications  . meclizine (ANTIVERT) 25 MG tablet    Sig: Take 1 tablet (25 mg total) by mouth 3 (three) times daily as needed for dizziness.    Dispense:  30 tablet    Refill:  0   Patient Instructions    Thanks for  coming in today. I'm suspicious of a hamstring injury and will refer you to sports medicine for possible ultrasound of that area. Okay to continue Tylenol, ice, gentle range of motion and cane as needed for walking. Let me know if there are changes prior to that visit with sports medicine.  I'm glad to hear that vertigo is improving. Continue meclizine as needed but can decrease frequency of dosing.   If you have lab work done today you will be contacted with your lab results within the next 2 weeks.  If you have not heard from [place] then please contact us. The fastest way to get your results is to register for My Chart.   IF you received an x-ray today, you will receive an invoice from [facility]. Please contact [facility] at [phone] with questions or concerns regarding your invoice.   IF you received labwork today, you will receive an invoice from [facility]. Please contact [facility] at [phone] with questions or concerns regarding your invoice.   Our billing staff will not be able to assist you with questions regarding bills from these companies.  You will be contacted with the lab results as soon as they are available. The fastest way to get your results is to activate your My Chart account. Instructions are located on the last page of this paperwork. If you have not heard from [place] regarding the results in 2 weeks, please contact this office.          Signed, [name], MD [facility]

## 2020-12-20 NOTE — Patient Instructions (Addendum)
  Thanks for coming in today. I'm suspicious of a hamstring injury and will refer you to sports medicine for possible ultrasound of that area. Okay to continue Tylenol, ice, gentle range of motion and cane as needed for walking. Let me know if there are changes prior to that visit with sports medicine.  I'm glad to hear that vertigo is improving. Continue meclizine as needed but can decrease frequency of dosing.   If you have lab work done today you will be contacted with your lab results within the next 2 weeks.  If you have not heard from [place] then please contact us. The fastest way to get your results is to register for My Chart.   IF you received an x-ray today, you will receive an invoice from [facility]. Please contact [facility] at [phone] with questions or concerns regarding your invoice.   IF you received labwork today, you will receive an invoice from [facility]. Please contact [facility] at [phone] with questions or concerns regarding your invoice.   Our billing staff will not be able to assist you with questions regarding bills from these companies.  You will be contacted with the lab results as soon as they are available. The fastest way to get your results is to activate your My Chart account. Instructions are located on the last page of this paperwork. If you have not heard from [place] regarding the results in 2 weeks, please contact this office.

## 2020-12-21 ENCOUNTER — Encounter: Payer: Self-pay | Admitting: Family Medicine

## 2020-12-25 ENCOUNTER — Ambulatory Visit (INDEPENDENT_AMBULATORY_CARE_PROVIDER_SITE_OTHER): Payer: Medicare Other | Admitting: Sports Medicine

## 2020-12-25 ENCOUNTER — Other Ambulatory Visit: Payer: Self-pay

## 2020-12-25 VITALS — BP 135/66 | Ht 66.0 in | Wt 153.0 lb

## 2020-12-25 DIAGNOSIS — M25552 Pain in left hip: Secondary | ICD-10-CM | POA: Diagnosis not present

## 2020-12-25 DIAGNOSIS — I2510 Atherosclerotic heart disease of native coronary artery without angina pectoris: Secondary | ICD-10-CM

## 2020-12-25 MED ORDER — DIAZEPAM 10 MG PO TABS
ORAL_TABLET | ORAL | 0 refills | Status: DC
Start: 2020-12-25 — End: 2020-12-31

## 2020-12-25 NOTE — Patient Instructions (Addendum)
Dix-Hallpike maneuver for vertigo

## 2020-12-26 DIAGNOSIS — M899 Disorder of bone, unspecified: Secondary | ICD-10-CM | POA: Diagnosis not present

## 2020-12-26 NOTE — Progress Notes (Signed)
   Subjective:    Patient ID: Doris Wilkerson, female    DOB: 1945/12/30, 75 y.o.   MRN: [id number]  HPI chief complaint: Left hip pain  75 year old female comes in today complaining of severe posterior left hip pain that began about 6 days ago.  She felt and heard a pop in the posterior aspect of her hip after turning suddenly in a parking lot to try to grab a rolling grocery cart.  Since then, she has had significant pain in the posterior aspect of her hip.  She also endorses weakness and has great difficulty navigating stairs and sitting.  She is ambulating with the assistance of a cane.  She localizes her pain to the posterior and posterior medial aspect of the hip.  Prior to this injury, she had had several weeks of nagging pain in the same area.  X-rays of her left hip done on January 13 were unremarkable.  She denies numbness or tingling.  Past medical history reviewed Medications reviewed Allergies reviewed    Review of Systems As above    Objective:   Physical Exam Well-developed, well-nourished.  No acute distress  Left hip: Smooth painless hip range of motion with a negative logroll.  There is no obvious deformity, swelling, or ecchymosis in the posterior hip.  Proximal hamstring tendon is palpable but is painful at the ischial tuberosity.  There is also more pain medial to the proximal hamstring tendon in the area of the abductors.  No tenderness to palpation along the lateral hip.  Good strength with resisted hip flexion.  There is some weakness but no pain with resisted adduction.  Majority of her pain is with resisted abduction which also causes pain.  She does have fairly good strength with resisted knee flexion. She is ambulating with an antalgic gait and the assistance of a cane.  MSK ultrasound of the posterior left hip was performed.  This was a difficult study given the amount of pain the patient was then and difficulty achieving optimal position for viewing the proximal  hamstring tendon.  Visualized hamstring tendon appeared unremarkable.       Assessment & Plan:   Severe posterior left hip pain and weakness-rule out high-grade partial tear of the proximal hamstring tendon versus tear of the abductor tendon  Diagnosis is not quite clear based on physical exam.  She does however have significant pain and weakness after an acute injury about a week ago.  Therefore, I have recommended an MRI of the pelvis specifically to rule out tendon injury.  Phone follow-up with those results when available.  We will delineate further work-up and treatment based on those findings.

## 2020-12-27 ENCOUNTER — Other Ambulatory Visit: Payer: Self-pay

## 2020-12-27 ENCOUNTER — Telehealth: Payer: Self-pay | Admitting: Hematology and Oncology

## 2020-12-27 ENCOUNTER — Ambulatory Visit (INDEPENDENT_AMBULATORY_CARE_PROVIDER_SITE_OTHER): Payer: Medicare Other | Admitting: Sports Medicine

## 2020-12-27 ENCOUNTER — Encounter: Payer: Self-pay | Admitting: Sports Medicine

## 2020-12-27 VITALS — BP 171/81

## 2020-12-27 DIAGNOSIS — M25552 Pain in left hip: Secondary | ICD-10-CM | POA: Diagnosis not present

## 2020-12-27 DIAGNOSIS — I2510 Atherosclerotic heart disease of native coronary artery without angina pectoris: Secondary | ICD-10-CM

## 2020-12-27 DIAGNOSIS — C9000 Multiple myeloma not having achieved remission: Secondary | ICD-10-CM

## 2020-12-27 NOTE — Progress Notes (Signed)
Patient ID: Doris Wilkerson, female   DOB: 08-09-1946, 75 y.o.   MRN: [id number]  Doris Wilkerson comes in today with her husband to discuss MRI findings of her pelvis.  There appear to be multiple bone lesions involving the L4 and L5 vertebral bodies as well as bilateral sacrum and left ischial tuberosity.  There also appears to be a pathologic fracture through the left inferior pubic ramus.  This is most likely the source of her pain.  These findings are concerning for multiple myeloma or possible bony metastatic disease.  Therefore, we have referred the patient to the [place] at [facility] for further evaluation.  She currently has an initial consultation with them scheduled for Monday, December 31, 2020.

## 2020-12-27 NOTE — Patient Instructions (Signed)
[facility] at [facility] [place], [place], [place] [address] [phone]  Appt: Monday 12/31/20 @ 1 pm

## 2020-12-27 NOTE — Telephone Encounter (Signed)
Received an urgent referral from Dr. Micheline Chapman for anew dx of multiple myeloma. Ms. Doris Wilkerson has been scheduled to see Dr. Lorenso Courier on 2/7 at 1pm. The referring office will notify the pt of the appt date and time.

## 2020-12-28 ENCOUNTER — Encounter: Payer: Self-pay | Admitting: Family Medicine

## 2020-12-28 NOTE — Telephone Encounter (Signed)
Called patient to discuss her recent results from recent MRI, and discuss any questions. I did speak with Dr. Micheline Chapman yesterday and appreciate his assistance and urgent referral to oncology.   Pain in hip has been doing ok. She is processing information as much as possible as for now. Understands findings and plan, with plan on oncology eval Monday. Has valium if needed, but not needed at this time. Sleeping ok. No new pain meds needed at this time - tylenol 3 times per day. Denies any needs at this time and appreciated my call.   MRI concerning for multiple myeloma versus metastatic disease.  Most recent basic metabolic panel with normal renal function, borderline calcium at 10.4, but had been normal at 10.2, 10.1 in July in January 2021.  CBC normal on January 13.  No anemia.  She is up-to-date with colonoscopy in 2018, plan to repeat in 10 years and mammogram last April without concerning findings.  Will follow notes and plan from oncology visit this Monday.

## 2020-12-30 ENCOUNTER — Encounter: Payer: Self-pay | Admitting: Family Medicine

## 2020-12-31 ENCOUNTER — Other Ambulatory Visit: Payer: Self-pay

## 2020-12-31 ENCOUNTER — Inpatient Hospital Stay: Payer: Medicare Other

## 2020-12-31 ENCOUNTER — Inpatient Hospital Stay: Payer: Medicare Other | Attending: Hematology and Oncology | Admitting: Hematology and Oncology

## 2020-12-31 ENCOUNTER — Encounter: Payer: Self-pay | Admitting: Hematology and Oncology

## 2020-12-31 VITALS — BP 147/64 | HR 69 | Temp 98.3°F | Resp 15 | Ht 66.0 in | Wt 152.9 lb

## 2020-12-31 DIAGNOSIS — Z7982 Long term (current) use of aspirin: Secondary | ICD-10-CM | POA: Diagnosis not present

## 2020-12-31 DIAGNOSIS — M899 Disorder of bone, unspecified: Secondary | ICD-10-CM

## 2020-12-31 DIAGNOSIS — Z79899 Other long term (current) drug therapy: Secondary | ICD-10-CM | POA: Diagnosis not present

## 2020-12-31 DIAGNOSIS — R9389 Abnormal findings on diagnostic imaging of other specified body structures: Secondary | ICD-10-CM | POA: Diagnosis not present

## 2020-12-31 DIAGNOSIS — I10 Essential (primary) hypertension: Secondary | ICD-10-CM | POA: Insufficient documentation

## 2020-12-31 DIAGNOSIS — E785 Hyperlipidemia, unspecified: Secondary | ICD-10-CM | POA: Diagnosis not present

## 2020-12-31 LAB — CBC WITH DIFFERENTIAL (CANCER CENTER ONLY)
Abs Immature Granulocytes: 0.02 10*3/uL (ref 0.00–0.07)
Basophils Absolute: 0.1 10*3/uL (ref 0.0–0.1)
Basophils Relative: 1 %
Eosinophils Absolute: 0.2 10*3/uL (ref 0.0–0.5)
Eosinophils Relative: 4 %
HCT: 32.5 % — ABNORMAL LOW (ref 36.0–46.0)
Hemoglobin: 10.9 g/dL — ABNORMAL LOW (ref 12.0–15.0)
Immature Granulocytes: 0 %
Lymphocytes Relative: 28 %
Lymphs Abs: 1.4 10*3/uL (ref 0.7–4.0)
MCH: 29.9 pg (ref 26.0–34.0)
MCHC: 33.5 g/dL (ref 30.0–36.0)
MCV: 89 fL (ref 80.0–100.0)
Monocytes Absolute: 0.6 10*3/uL (ref 0.1–1.0)
Monocytes Relative: 11 %
Neutro Abs: 2.9 10*3/uL (ref 1.7–7.7)
Neutrophils Relative %: 56 %
Platelet Count: 225 10*3/uL (ref 150–400)
RBC: 3.65 MIL/uL — ABNORMAL LOW (ref 3.87–5.11)
RDW: 14 % (ref 11.5–15.5)
WBC Count: 5.1 10*3/uL (ref 4.0–10.5)
nRBC: 0 % (ref 0.0–0.2)

## 2020-12-31 LAB — CMP (CANCER CENTER ONLY)
ALT: 19 U/L (ref 0–44)
AST: 22 U/L (ref 15–41)
Albumin: 4.5 g/dL (ref 3.5–5.0)
Alkaline Phosphatase: 70 U/L (ref 38–126)
Anion gap: 8 (ref 5–15)
BUN: 14 mg/dL (ref 8–23)
CO2: 24 mmol/L (ref 22–32)
Calcium: 9.9 mg/dL (ref 8.9–10.3)
Chloride: 109 mmol/L (ref 98–111)
Creatinine: 0.79 mg/dL (ref 0.44–1.00)
GFR, Estimated: >60 mL/min (ref >60)
Glucose, Bld: 101 mg/dL — ABNORMAL HIGH (ref 70–99)
Potassium: 4 mmol/L (ref 3.5–5.1)
Sodium: 141 mmol/L (ref 135–145)
Total Bilirubin: 0.5 mg/dL (ref 0.3–1.2)
Total Protein: 7 g/dL (ref 6.5–8.1)

## 2020-12-31 LAB — LACTATE DEHYDROGENASE: LDH: 198 U/L — ABNORMAL HIGH (ref 98–192)

## 2020-12-31 NOTE — Progress Notes (Signed)
[facility] [phone]   Fax:([phone] [place] NOTE  Patient Care Team: [name], MD as PCP - General (Family Medicine) [name], MD as Consulting Physician (Cardiology) [name], MD (Ophthalmology)  Hematological/Oncological History # Lytic Bone Lesions 12/26/2020: MRI of pelvis showed lytic lesions on L4, L5, the sacrum, with pathologic fracture of left inferior pubic ramus. Concerning for multiple myeloma vs metastatic disease 12/31/2020: establish care with Dr. Lorenso Courier   CHIEF COMPLAINTS/PURPOSE OF CONSULTATION:  "Lytic Bone Lesions "  HISTORY OF PRESENTING ILLNESS:  Doris Wilkerson 75 y.o. female with medical history significant for GERD, HLD, asthma, and HTN who presents for evaluation of lytic bone lesions incidentally noted on MRI.   On review of the previous records Doris Wilkerson underwent an MRI of her pelvis on 12/26/2020 which unfortunately showed lytic lesions of the L4 and L5 spine, sacrum, and a pathological fracture of the pubic ramus.  These lesions were concerning for multiple myeloma versus metastatic disease.  Due to concern for these lesions the patient was referred to hematology for further evaluation and management.  On exam today Doris Wilkerson is accompanied by her husband. She notes that her symptoms began in the late fall 2021 when she began having "general aches and soreness". She notes that it was prickly bad in her left hip and that it continued worsening into December 2021. She notes that she had appointments with orthopedic surgery in January for further evaluation. On 2020-12-20 the patient said the pain was became more severe after she heard a pop while pushing a cart in a parking lot. An MRI was performed which showed the lytic lesions which we are evaluating today. She also notes in the interim since this time she has developed pain in her right lower ribs.  On further discussion she reports that she has not  been having any issues with fevers, chills, sweats, nausea, vomiting or diarrhea. She has had no changes in weight. Her appetite is decreased and she has noted she is having lighter more frequent urine. She reports that her brother had a history of hairy cell leukemia and her maternal aunt also had a history of some form of leukemia. She is a never smoker and only drinks alcohol about once weekly. 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 A full 10 point ROS is listed below.  MEDICAL HISTORY:  Past Medical History:  Diagnosis Date  . Asthma   . Cataract   . GERD (gastroesophageal reflux disease)   . Hyperlipidemia   . Hypertension   . Thyroid disease     SURGICAL HISTORY: Past Surgical History:  Procedure Laterality Date  . Cataract Surgery    . CORONARY ANGIOPLASTY WITH STENT PLACEMENT    . EYE SURGERY    . ORTHOPEDIC SURGERY  2012  . TONSILLECTOMY  1951    SOCIAL HISTORY: Social History   Socioeconomic History  . Marital status: Married    Spouse name: Not on file  . Number of children: 0  . Years of education: Not on file  . Highest education level: Not on file  Occupational History  . Occupation: unemployed  Tobacco Use  . Smoking status: Never Smoker  . Smokeless tobacco: Never Used  Vaping Use  . Vaping Use: Never used  Substance and Sexual Activity  . Alcohol use: Yes    Alcohol/week: 1.0 standard drink    Types: 1 Glasses of wine per week    Comment:  occassionally  . Drug use: No  . Sexual activity: Not Currently  Other Topics Concern  . Not on file  Social History Narrative   Married. Education: college. Pt does exercise-   Social Determinants of Health   Financial Resource Strain: Not on file  Food Insecurity: Not on file  Transportation Needs: Not on file  Physical Activity: Not on file  Stress: Not on file  Social Connections: Not on file  Intimate Partner Violence: Not on file    FAMILY HISTORY: Family History  Problem Relation  Age of Onset  . Cancer Father   . Heart disease Brother   . Dementia Brother   . Leukemia Brother   . Breast cancer Neg Hx   . Colon cancer Neg Hx   . Esophageal cancer Neg Hx   . Pancreatic cancer Neg Hx   . Rectal cancer Neg Hx   . Stomach cancer Neg Hx     ALLERGIES:  is allergic to poison ivy extract, plavix [clopidogrel bisulfate], and other.  MEDICATIONS:  Current Outpatient Medications  Medication Sig Dispense Refill  . aspirin 81 MG tablet Take 81 mg by mouth daily.     Marland Kitchen atorvastatin (LIPITOR) 80 MG tablet Take 1 tablet (80 mg total) by mouth daily at 6 PM. 90 tablet 3  . benazepril (LOTENSIN) 10 MG tablet TAKE 1 TABLET(10 MG) BY MOUTH DAILY 90 tablet 3  . calcium carbonate (OS-CAL) 1250 (500 Ca) MG chewable tablet Chew 1 tablet by mouth daily.    Marland Kitchen DM-APAP-CPM (CORICIDIN HBP PO) Take by mouth.     . levothyroxine (SYNTHROID) 25 MCG tablet TAKE 1 TABLET(25 MCG) BY MOUTH DAILY BEFORE BREAKFAST 90 tablet 3  . meclizine (ANTIVERT) 25 MG tablet Take 1 tablet (25 mg total) by mouth 3 (three) times daily as needed for dizziness. 30 tablet 0  . OVER THE COUNTER MEDICATION Vitamin D 2000iu daily.     . vitamin B-12 (CYANOCOBALAMIN) 250 MCG tablet Take 250 mcg by mouth daily.     No current facility-administered medications for this visit.    REVIEW OF SYSTEMS:   Constitutional: ( - ) fevers, ( - )  chills , ( - ) night sweats Eyes: ( - ) blurriness of vision, ( - ) double vision, ( - ) watery eyes Ears, nose, mouth, throat, and face: ( - ) mucositis, ( - ) sore throat Respiratory: ( - ) cough, ( - ) dyspnea, ( - ) wheezes Cardiovascular: ( - ) palpitation, ( - ) chest discomfort, ( - ) lower extremity swelling Gastrointestinal:  ( - ) nausea, ( - ) heartburn, ( - ) change in bowel habits Skin: ( - ) abnormal skin rashes Lymphatics: ( - ) new lymphadenopathy, ( - ) easy bruising Neurological: ( - ) numbness, ( - ) tingling, ( - ) new weaknesses Behavioral/Psych: ( - ) mood  change, ( - ) new changes  All other systems were reviewed with the patient and are negative.  PHYSICAL EXAMINATION: ECOG PERFORMANCE STATUS: 1 - Symptomatic but completely ambulatory  Vitals:   12/31/20 1254  BP: (!) 147/64  Pulse: 69  Resp: 15  Temp: 98.3 F (36.8 C)  SpO2: 100%   Filed Weights   12/31/20 1254  Weight: 152 lb 14.4 oz (69.4 kg)    GENERAL: well appearing elderly Caucasian female in NAD  SKIN: skin color, texture, turgor are normal, no rashes or significant lesions EYES: conjunctiva are pink and non-injected, sclera clear LUNGS: clear  to auscultation and percussion with normal breathing effort HEART: regular rate & rhythm and no murmurs and no lower extremity edema Musculoskeletal: no cyanosis of digits and no clubbing  PSYCH: alert & oriented x 3, fluent speech NEURO: no focal motor/sensory deficits  LABORATORY DATA:  I have reviewed the data as listed CBC Latest Ref Rng & Units 12/31/2020 12/06/2020 06/01/2020  WBC 4.0 - 10.5 K/uL 5.1 5.2 4.9  Hemoglobin 12.0 - 15.0 g/dL 10.9(L) 12.0 13.1  Hematocrit 36.0 - 46.0 % 32.5(L) 36.2 39.6  Platelets 150 - 400 K/uL 225 248 238    CMP Latest Ref Rng & Units 12/31/2020 12/06/2020 06/01/2020  Glucose 70 - 99 mg/dL 101(H) 97 94  BUN 8 - 23 mg/dL '14 14 12  ' Creatinine 0.44 - 1.00 mg/dL 0.79 0.79 0.68  Sodium 135 - 145 mmol/L 141 140 144  Potassium 3.5 - 5.1 mmol/L 4.0 4.4 4.3  Chloride 98 - 111 mmol/L 109 103 103  CO2 22 - 32 mmol/L '24 24 25  ' Calcium 8.9 - 10.3 mg/dL 9.9 10.4(H) 10.2  Total Protein 6.5 - 8.1 g/dL 7.0 - 6.8  Total Bilirubin 0.3 - 1.2 mg/dL 0.5 - 0.7  Alkaline Phos 38 - 126 U/L 70 - 86  AST 15 - 41 U/L 22 - 26  ALT 0 - 44 U/L 19 - 22      RADIOGRAPHIC STUDIES:    DG HIP UNILAT W OR W/O PELVIS 2-3 VIEWS LEFT  Result Date: 12/06/2020 CLINICAL DATA:  Left hip pain without known injury. EXAM: DG HIP (WITH OR WITHOUT PELVIS) 2-3V LEFT COMPARISON:  None. FINDINGS: There is no evidence of hip fracture  or dislocation. There is no evidence of arthropathy or other focal bone abnormality. IMPRESSION: Negative. Electronically Signed   By: Marijo Conception M.D.   On: 12/06/2020 10:43    ASSESSMENT & PLAN Tiffine Henigan 75 y.o. female with medical history significant for GERD, HLD, asthma, and HTN who presents for evaluation of lytic bone lesions incidentally noted on MRI.   After review the labs, the records, and review the imaging the findings are most concerning for a metastatic process or multiple myeloma. Unfortunately this time we only have report of the MRI and therefore I am not able to review it personally. Based on the findings there is concern for metastatic disease versus multiple Wilkerson. We will begin our evaluation with a full multiple myeloma blood work panel as well as a 24-hour urine protein. In the event that shows signs concerning for multiple myeloma we will proceed with a bone marrow biopsy in order to confirm and consider therapies for multiple myeloma. In the event that these findings are not consistent with multiple myeloma we would need to perform a CT chest abdomen pelvis in order to look for the cause of this metastatic disease. The patient voiced understanding of this plan moving forward.  Colonoscopy 11/10/2017: no polyps noted Mammography 03/13/2020: BI-RADS CATEGORY  1: Negative.  # Lytic Bone Lesions --Findings are concerning for multiple myeloma versus metastatic disease of solid tumor. --Patient is up-to-date on her cancer screenings with colonoscopy in 2018 and mammography in 2021. --Today we will order baseline CMP, LDH and CBC. --We will attempt to rule out multiple myeloma with SPEP, UPEP, serum free light chains.  If there are concerning findings on this lab work-up we will need to pursue a bone marrow biopsy. --In the event the multiple myeloma labs are not revealing we would need to pursue additional  imaging, most likely a CT chest abdomen pelvis in order to look  for the source of the patient's metastatic disease. --Plan for return to clinic pending the results of the above studies.  Orders Placed This Encounter  Procedures  . CT CHEST ABDOMEN PELVIS W CONTRAST    Standing Status:   Future    Standing Expiration Date:   12/31/2021    Order Specific Question:   If indicated for the ordered procedure, I authorize the administration of contrast media per Radiology protocol    Answer:   Yes    Order Specific Question:   Preferred imaging location?    Answer:   [facility]    Order Specific Question:   Is Oral Contrast requested for this exam?    Answer:   Yes, Per Radiology protocol    Order Specific Question:   Reason for Exam (SYMPTOM  OR DIAGNOSIS REQUIRED)    Answer:   concern for metastatic disease  . CBC with Differential (Cancer Center Only)    Standing Status:   Future    Number of Occurrences:   1    Standing Expiration Date:   12/31/2021  . CMP ([facility] only)    Standing Status:   Future    Number of Occurrences:   1    Standing Expiration Date:   12/31/2021  . Lactate dehydrogenase (LDH)    Standing Status:   Future    Number of Occurrences:   1    Standing Expiration Date:   12/31/2021  . Beta 2 microglobulin    Standing Status:   Future    Number of Occurrences:   1    Standing Expiration Date:   12/31/2021  . Multiple Myeloma Panel (SPEP&IFE w/QIG)    Standing Status:   Future    Number of Occurrences:   1    Standing Expiration Date:   12/31/2021  . Kappa/lambda light chains    Standing Status:   Future    Number of Occurrences:   1    Standing Expiration Date:   12/31/2021  . 24-Hr Ur UPEP/UIFE/Light Chains/TP    Standing Status:   Future    Standing Expiration Date:   12/31/2021  . Ambulatory referral to Physical Therapy    Referral Priority:   Urgent    Referral Type:   Physical Medicine    Referral Reason:   Specialty Services Required    Requested Specialty:   Physical Therapy    Number of Visits Requested:   1     All questions were answered. The patient knows to call the clinic with any problems, questions or concerns.  A total of more than 60 minutes were spent on this encounter and over half of that time was spent on counseling and coordination of care as outlined above.   Ledell Peoples, MD Department of Hematology/Oncology [place] at [facility] Phone: [phone] Pager: [phone] Email: [email or web address]  01/01/2021 11:58 AM

## 2021-01-01 ENCOUNTER — Telehealth: Payer: Self-pay | Admitting: Hematology and Oncology

## 2021-01-01 ENCOUNTER — Encounter: Payer: Self-pay | Admitting: Hematology and Oncology

## 2021-01-01 LAB — BETA 2 MICROGLOBULIN, SERUM: Beta-2 Microglobulin: 2.2 mg/L (ref 0.6–2.4)

## 2021-01-01 LAB — KAPPA/LAMBDA LIGHT CHAINS
Kappa free light chain: 10.9 mg/L (ref 3.3–19.4)
Kappa, lambda light chain ratio: 0 — ABNORMAL LOW (ref 0.26–1.65)
Lambda free light chains: 3747.4 mg/L — ABNORMAL HIGH (ref 5.7–26.3)

## 2021-01-01 NOTE — Telephone Encounter (Signed)
No changes made to pt's per 2/7 los that labs are pending

## 2021-01-02 ENCOUNTER — Telehealth: Payer: Self-pay | Admitting: Hematology and Oncology

## 2021-01-02 ENCOUNTER — Other Ambulatory Visit: Payer: Self-pay

## 2021-01-02 DIAGNOSIS — R9389 Abnormal findings on diagnostic imaging of other specified body structures: Secondary | ICD-10-CM | POA: Diagnosis not present

## 2021-01-02 DIAGNOSIS — M899 Disorder of bone, unspecified: Secondary | ICD-10-CM

## 2021-01-02 DIAGNOSIS — E785 Hyperlipidemia, unspecified: Secondary | ICD-10-CM | POA: Diagnosis not present

## 2021-01-02 DIAGNOSIS — Z79899 Other long term (current) drug therapy: Secondary | ICD-10-CM | POA: Diagnosis not present

## 2021-01-02 DIAGNOSIS — Z7982 Long term (current) use of aspirin: Secondary | ICD-10-CM | POA: Diagnosis not present

## 2021-01-02 DIAGNOSIS — I10 Essential (primary) hypertension: Secondary | ICD-10-CM | POA: Diagnosis not present

## 2021-01-02 NOTE — Telephone Encounter (Signed)
Called Mrs. Odwyer to discuss the results of her blood work.  Although all findings are not back yet we have found a marked elevation in her lambda light chains with an extraordinarily low capital lambda light chain ratio.  Given these findings her results are most consistent with multiple myeloma.  We will cancel the CT chest abdomen pelvis which was scheduled and plan to proceed with a metastatic survey as well as a bone marrow biopsy in order to confirm the diagnosis.  We will keep an eye out for the SPEP and UPEP which are still pending at this time.  Unfortunately there was no answer at this number and a message was left requesting a call back.  Ledell Peoples, MD Department of Hematology/Oncology [place] at [facility] Phone: [phone] Pager: [phone] Email: [email or web address]

## 2021-01-03 ENCOUNTER — Other Ambulatory Visit: Payer: Self-pay | Admitting: *Deleted

## 2021-01-03 MED ORDER — ACETAMINOPHEN ER 650 MG PO TBCR: 650.0000 mg | EXTENDED_RELEASE_TABLET | Freq: Three times a day (TID) | ORAL | Status: DC | PRN

## 2021-01-03 MED ORDER — ACETAMINOPHEN 500 MG PO TABS: 500.0000 mg | ORAL_TABLET | Freq: Three times a day (TID) | ORAL | 0 refills | Status: DC | PRN

## 2021-01-04 ENCOUNTER — Encounter: Payer: Self-pay | Admitting: Hematology and Oncology

## 2021-01-04 LAB — UPEP/UIFE/LIGHT CHAINS/TP, 24-HR UR
% BETA, Urine: 81.1 %
ALPHA 1 URINE: 1 %
Albumin, U: 14.8 %
Alpha 2, Urine: 2 %
Free Kappa Lt Chains,Ur: 44.11 mg/L (ref 0.63–113.79)
Free Kappa/Lambda Ratio: 0.01 — ABNORMAL LOW (ref 1.03–31.76)
Free Lambda Lt Chains,Ur: 5210.5 mg/L — ABNORMAL HIGH (ref 0.47–11.77)
GAMMA GLOBULIN URINE: 1.1 %
M-SPIKE %, Urine: 78.6 % — ABNORMAL HIGH
M-Spike, Mg/24 Hr: 3732 mg/24 hr — ABNORMAL HIGH
Total Protein, Urine-Ur/day: 4748 mg/24 hr — ABNORMAL HIGH (ref 30–150)
Total Protein, Urine: 211 mg/dL
Total Volume: 2250

## 2021-01-07 ENCOUNTER — Telehealth: Payer: Self-pay | Admitting: Hematology and Oncology

## 2021-01-07 DIAGNOSIS — M899 Disorder of bone, unspecified: Secondary | ICD-10-CM

## 2021-01-07 LAB — MULTIPLE MYELOMA PANEL, SERUM
Albumin SerPl Elph-Mcnc: 4.4 g/dL (ref 2.9–4.4)
Albumin/Glob SerPl: 1.8 — ABNORMAL HIGH (ref 0.7–1.7)
Alpha 1: 0.2 g/dL (ref 0.0–0.4)
Alpha2 Glob SerPl Elph-Mcnc: 0.8 g/dL (ref 0.4–1.0)
B-Globulin SerPl Elph-Mcnc: 1 g/dL (ref 0.7–1.3)
Gamma Glob SerPl Elph-Mcnc: 0.4 g/dL (ref 0.4–1.8)
Globulin, Total: 2.5 g/dL (ref 2.2–3.9)
IgA: 45 mg/dL — ABNORMAL LOW (ref 64–422)
IgG (Immunoglobin G), Serum: 495 mg/dL — ABNORMAL LOW (ref 586–1602)
IgM (Immunoglobulin M), Srm: 10 mg/dL — ABNORMAL LOW (ref 26–217)
Total Protein ELP: 6.9 g/dL (ref 6.0–8.5)

## 2021-01-07 NOTE — Telephone Encounter (Signed)
Called Mrs. Wahlert to discuss the results of her UPEP and SFLC. Findings highly concerning for multiple myeloma. Patient will need a metastatic survey and bone marrow biopsy. Additionally will plan for chemotherapy education. Plan to start treatment with RVd chemotherapy.   Ledell Peoples, MD Department of Hematology/Oncology [place] at [facility] Phone: [phone] Pager: [phone] Email: [email or web address]

## 2021-01-09 ENCOUNTER — Ambulatory Visit (HOSPITAL_COMMUNITY)
Admission: RE | Admit: 2021-01-09 | Discharge: 2021-01-09 | Disposition: A | Discharge status: Home / Self Care | Payer: Medicare Other | Source: Ambulatory Visit | Attending: Hematology and Oncology | Admitting: Hematology and Oncology

## 2021-01-09 ENCOUNTER — Encounter: Payer: Self-pay | Admitting: Hematology and Oncology

## 2021-01-09 ENCOUNTER — Other Ambulatory Visit: Payer: Self-pay

## 2021-01-09 DIAGNOSIS — M899 Disorder of bone, unspecified: Secondary | ICD-10-CM | POA: Insufficient documentation

## 2021-01-09 DIAGNOSIS — C9000 Multiple myeloma not having achieved remission: Secondary | ICD-10-CM | POA: Diagnosis not present

## 2021-01-11 ENCOUNTER — Encounter: Payer: Self-pay | Admitting: Hematology and Oncology

## 2021-01-14 ENCOUNTER — Encounter: Payer: Self-pay | Admitting: Hematology and Oncology

## 2021-01-14 ENCOUNTER — Ambulatory Visit (HOSPITAL_COMMUNITY): Payer: Medicare Other

## 2021-01-17 ENCOUNTER — Other Ambulatory Visit: Payer: Self-pay | Admitting: Radiology

## 2021-01-18 ENCOUNTER — Other Ambulatory Visit: Payer: Self-pay | Admitting: Radiology

## 2021-01-21 ENCOUNTER — Encounter (HOSPITAL_COMMUNITY): Payer: Self-pay

## 2021-01-21 ENCOUNTER — Other Ambulatory Visit: Payer: Self-pay

## 2021-01-21 ENCOUNTER — Ambulatory Visit (HOSPITAL_COMMUNITY)
Admission: RE | Admit: 2021-01-21 | Discharge: 2021-01-21 | Disposition: A | Discharge status: Home / Self Care | Payer: Medicare Other | Source: Ambulatory Visit | Attending: Hematology and Oncology | Admitting: Hematology and Oncology

## 2021-01-21 DIAGNOSIS — D649 Anemia, unspecified: Secondary | ICD-10-CM | POA: Insufficient documentation

## 2021-01-21 DIAGNOSIS — C9000 Multiple myeloma not having achieved remission: Secondary | ICD-10-CM | POA: Diagnosis not present

## 2021-01-21 DIAGNOSIS — D6489 Other specified anemias: Secondary | ICD-10-CM | POA: Diagnosis not present

## 2021-01-21 DIAGNOSIS — D47Z9 Other specified neoplasms of uncertain behavior of lymphoid, hematopoietic and related tissue: Secondary | ICD-10-CM | POA: Diagnosis not present

## 2021-01-21 DIAGNOSIS — R809 Proteinuria, unspecified: Secondary | ICD-10-CM | POA: Diagnosis not present

## 2021-01-21 DIAGNOSIS — R779 Abnormality of plasma protein, unspecified: Secondary | ICD-10-CM | POA: Diagnosis not present

## 2021-01-21 DIAGNOSIS — M899 Disorder of bone, unspecified: Secondary | ICD-10-CM

## 2021-01-21 LAB — CBC WITH DIFFERENTIAL/PLATELET
Abs Immature Granulocytes: 0.02 10*3/uL (ref 0.00–0.07)
Basophils Absolute: 0.1 10*3/uL (ref 0.0–0.1)
Basophils Relative: 1 %
Eosinophils Absolute: 0.2 10*3/uL (ref 0.0–0.5)
Eosinophils Relative: 4 %
HCT: 35 % — ABNORMAL LOW (ref 36.0–46.0)
Hemoglobin: 11.3 g/dL — ABNORMAL LOW (ref 12.0–15.0)
Immature Granulocytes: 0 %
Lymphocytes Relative: 34 %
Lymphs Abs: 2 10*3/uL (ref 0.7–4.0)
MCH: 29.5 pg (ref 26.0–34.0)
MCHC: 32.3 g/dL (ref 30.0–36.0)
MCV: 91.4 fL (ref 80.0–100.0)
Monocytes Absolute: 0.6 10*3/uL (ref 0.1–1.0)
Monocytes Relative: 11 %
Neutro Abs: 2.8 10*3/uL (ref 1.7–7.7)
Neutrophils Relative %: 50 %
Platelets: 243 10*3/uL (ref 150–400)
RBC: 3.83 MIL/uL — ABNORMAL LOW (ref 3.87–5.11)
RDW: 14.6 % (ref 11.5–15.5)
WBC: 5.7 10*3/uL (ref 4.0–10.5)
nRBC: 0 % (ref 0.0–0.2)

## 2021-01-21 LAB — PROTIME-INR
INR: 1 (ref 0.8–1.2)
Prothrombin Time: 13.1 seconds (ref 11.4–15.2)

## 2021-01-21 MED ORDER — MIDAZOLAM HCL 2 MG/2ML IJ SOLN
INTRAMUSCULAR | Status: AC | PRN
  Administered 2021-01-21: 1 mg via INTRAVENOUS

## 2021-01-21 MED ORDER — SODIUM CHLORIDE 0.9 % IV SOLN: INTRAVENOUS | Status: DC

## 2021-01-21 MED ORDER — FENTANYL CITRATE (PF) 100 MCG/2ML IJ SOLN
INTRAMUSCULAR | Status: AC
  Filled 2021-01-21: qty 2

## 2021-01-21 MED ORDER — FENTANYL CITRATE (PF) 100 MCG/2ML IJ SOLN
INTRAMUSCULAR | Status: AC | PRN
  Administered 2021-01-21 (×2): 50 ug via INTRAVENOUS

## 2021-01-21 MED ORDER — MIDAZOLAM HCL 2 MG/2ML IJ SOLN
INTRAMUSCULAR | Status: AC
  Filled 2021-01-21: qty 4

## 2021-01-21 MED ORDER — LIDOCAINE HCL (PF) 1 % IJ SOLN
INTRAMUSCULAR | Status: AC | PRN
  Administered 2021-01-21: 10 mL

## 2021-01-21 NOTE — Consult Note (Signed)
Chief Complaint: Patient was seen in consultation today for CT guided bone marrow biopsy  Referring Physician(s): Dorsey,John T IV  Supervising Physician: Mir, Sharen Heck  Patient Status: [facility] - Out-pt  History of Present Illness: Doris Wilkerson is a 75 y.o. female with PMH of asthma, GERD, hyperlipidemia, hypertension, and lytic bone lesions incidentally noted on MRI as well as abnormal SPEP/UPEP who presents today for CT-guided bone marrow biopsy for further evaluation.  Past Medical History:  Diagnosis Date  . Asthma   . Cataract   . GERD (gastroesophageal reflux disease)   . Hyperlipidemia   . Hypertension   . Thyroid disease     Past Surgical History:  Procedure Laterality Date  . Cataract Surgery    . CORONARY ANGIOPLASTY WITH STENT PLACEMENT    . EYE SURGERY    . ORTHOPEDIC SURGERY  2012  . TONSILLECTOMY  1951    Allergies: Poison ivy extract, Plavix [clopidogrel bisulfate], and Other  Medications: Prior to Admission medications   Medication Sig Start Date End Date Taking? Authorizing Provider  acetaminophen (TYLENOL 8 HOUR ARTHRITIS PAIN) 650 MG CR tablet Take 1 tablet (650 mg total) by mouth every 8 (eight) hours as needed for pain. 01/03/21  Yes Orson Slick, MD  aspirin 81 MG tablet Take 81 mg by mouth daily.    Yes Hayden Rasmussen, MD  atorvastatin (LIPITOR) 80 MG tablet Take 1 tablet (80 mg total) by mouth daily at 6 PM. 06/01/20  Yes [name], MD  benazepril (LOTENSIN) 10 MG tablet TAKE 1 TABLET(10 MG) BY MOUTH DAILY 06/01/20  Yes [name], MD  calcium carbonate (OS-CAL) 1250 (500 Ca) MG chewable tablet Chew 1 tablet by mouth daily.   Yes [provider]  levothyroxine (SYNTHROID) 25 MCG tablet TAKE 1 TABLET(25 MCG) BY MOUTH DAILY BEFORE BREAKFAST Patient taking differently: 630 mcg. TAKE 1 TABLET(25 MCG) BY MOUTH DAILY BEFORE BREAKFAST 06/01/20  Yes [name], MD  meclizine (ANTIVERT) 25 MG tablet Take 1 tablet (25 mg  total) by mouth 3 (three) times daily as needed for dizziness. 12/20/20  Yes [name], MD  NON FORMULARY 1,000 mg. CBD oil   Yes [provider]  OVER THE COUNTER MEDICATION Vitamin D 2000iu daily.    Yes [provider]  vitamin B-12 (CYANOCOBALAMIN) 250 MCG tablet Take 250 mcg by mouth daily.   Yes [provider]  acetaminophen (TYLENOL) 500 MG tablet Take 1 tablet (500 mg total) by mouth every 8 (eight) hours as needed. 01/03/21   Orson Slick, MD  DM-APAP-CPM (CORICIDIN HBP PO) Take by mouth.     [provider]     Family History  Problem Relation Age of Onset  . Cancer Father   . Heart disease Brother   . Dementia Brother   . Leukemia Brother   . Breast cancer Neg Hx   . Colon cancer Neg Hx   . Esophageal cancer Neg Hx   . Pancreatic cancer Neg Hx   . Rectal cancer Neg Hx   . Stomach cancer Neg Hx     Social History   Socioeconomic History  . Marital status: Married    Spouse name: Not on file  . Number of children: 0  . Years of education: Not on file  . Highest education level: Not on file  Occupational History  . Occupation: unemployed  Tobacco Use  . Smoking status: Never Smoker  . Smokeless tobacco: Never  Used  Vaping Use  . Vaping Use: Never used  Substance and Sexual Activity  . Alcohol use: Yes    Alcohol/week: 1.0 standard drink    Types: 1 Glasses of wine per week    Comment: occassionally  . Drug use: No  . Sexual activity: Not Currently  Other Topics Concern  . Not on file  Social History Narrative   Married. Education: college. Pt does exercise-   Social Determinants of Health   Financial Resource Strain: Not on file  Food Insecurity: Not on file  Transportation Needs: Not on file  Physical Activity: Not on file  Stress: Not on file  Social Connections: Not on file      Review of Systems:  denies fever, headache, chest pain, dyspnea, cough, abdominal pain, back pain, nausea, vomiting or  bleeding.  She does have some left hip discomfort.   Vital Signs: BP 119/73   Temp 98.1 F (36.7 C) (Oral)   Resp 17   Physical Exam awake, alert.  Chest clear to auscultation bilaterally.  Heart with regular rate and rhythm.  Abdomen soft, positive bowel sounds, nontender.  No lower extremity edema.  Imaging: DG Bone Survey Met  Result Date: 01/11/2021 CLINICAL DATA:  75 year old female with recent diagnosis of multiple myeloma. EXAM: METASTATIC BONE SURVEY COMPARISON:  Hip radiographs 12/06/2020 and earlier. FINDINGS: Calvarium bone mineralization appears normal. Cervical vertebral bone mineralization and vertebral height appear normal. Degenerative straightening of cervical lordosis. Disc and endplate degeneration V3-K1 through C6-C7. Normal thoracic segmentation. Mild levoconvex upper thoracic scoliosis is chronic but increased since 2012. Osteopenia in the thoracic spine. But no thoracic spine compression fracture. Normal thoracic disc spaces for age. Normal lumbar segmentation. Mild lumbar scoliosis. Mild degenerative retrolisthesis of L2 on L3 associated with severe disc space loss, endplate spurring and sclerosis. Moderate lower lumbar facet hypertrophy. No lumbar compression fracture. Relatively preserved disc spaces elsewhere. Lung volumes are chronically at the upper limits of normal to mildly hyperinflated. Mediastinal contours remain normal. No acute pulmonary abnormality. No rib lesion identified. Stable pelvis bone mineralization from last month. No discrete pelvis bone lesion. Mild hip joint space loss. Abdominal and pelvic visceral contours appear within normal limits. Mild for age vascular calcifications. Bilateral extremity bone mineralization appears decreased, osteopenia. No extremity lytic lesion identified. IMPRESSION: 1. Generalized osteopenia with no focal destructive or suspicious bone lesion identified radiographically. 2. Disc and endplate degeneration in the cervical spine,  lumbar spine. Degenerative spondylolisthesis at L2-L3. Electronically Signed   By: Genevie Ann M.D.   On: 01/11/2021 08:21    Labs:  CBC: Recent Labs    06/01/20 1009 12/06/20 1031 12/31/20 1423 01/21/21 0739  WBC 4.9 5.2 5.1 5.7  HGB 13.1 12.0 10.9* 11.3*  HCT 39.6 36.2 32.5* 35.0*  PLT 238 248 225 243    COAGS: Recent Labs    01/21/21 0739  INR 1.0    BMP: Recent Labs    06/01/20 1009 12/06/20 1031 12/31/20 1423  NA 144 140 141  K 4.3 4.4 4.0  CL 103 103 109  CO2 '25 24 24  ' GLUCOSE 94 97 101*  BUN '12 14 14  ' CALCIUM 10.2 10.4* 9.9  CREATININE 0.68 0.79 0.79  GFRNONAA 87 74 >60  GFRAA 100 85  --     LIVER FUNCTION TESTS: Recent Labs    06/01/20 1009 12/31/20 1423  BILITOT 0.7 0.5  AST 26 22  ALT 22 19  ALKPHOS 86 70  PROT 6.8 7.0  ALBUMIN 5.1*  4.5    TUMOR MARKERS: No results for input(s): AFPTM, CEA, CA199, CHROMGRNA in the last 8760 hours.  Assessment and Plan: 75 y.o. female with PMH of asthma, GERD, hyperlipidemia, hypertension, and lytic bone lesions incidentally noted on MRI as well as abnormal SPEP/UPEP who presents today for CT-guided bone marrow biopsy for further evaluation.Risks and benefits of procedure was discussed with the patient  including, but not limited to bleeding, infection, damage to adjacent structures or low yield requiring additional tests.  All of the questions were answered and there is agreement to proceed.  Consent signed and in chart.     Thank you for this interesting consult.  I greatly enjoyed meeting Violanda Bobeck and look forward to participating in their care.  A copy of this report was sent to the requesting provider on this date.  Electronically Signed: D. Rowe Robert, PA-C 01/21/2021, 8:24 AM  I spent a total of 20 minutes    in face to face in clinical consultation, greater than 50% of which was counseling/coordinating care for CT-guided bone marrow biopsy

## 2021-01-21 NOTE — Procedures (Signed)
Interventional Radiology Procedure Note  Procedure: Bone marrow biopsy  Indication: Multiple myeloma  Findings: Please refer to procedural dictation for full description.  Complications: None  EBL: < 10 mL  Nameer Summer, MD [phone]   

## 2021-01-21 NOTE — Discharge Instructions (Signed)
Please call Interventional Radiology clinic [phone] with any questions or concerns. ° °You may remove your dressing and shower tomorrow. ° ° °Bone Marrow Aspiration and Bone Marrow Biopsy, Adult, Care After °This sheet gives you information about how to care for yourself after your procedure. Your health care provider may also give you more specific instructions. If you have problems or questions, contact your health care provider. °What can I expect after the procedure? °After the procedure, it is common to have: °Mild pain and tenderness. °Swelling. °Bruising. °Follow these instructions at home: °Puncture site care °Follow instructions from your health care provider about how to take care of the puncture site. Make sure you: °Wash your hands with soap and water before and after you change your bandage (dressing). If soap and water are not available, use hand sanitizer. °Change your dressing as told by your health care provider. °Check your puncture site every day for signs of infection. Check for: °More redness, swelling, or pain. °Fluid or blood. °Warmth. °Pus or a bad smell.   °Activity °Return to your normal activities as told by your health care provider. Ask your health care provider what activities are safe for you. °Do not lift anything that is heavier than 10 lb (4.5 kg), or the limit that you are told, until your health care provider says that it is safe. °Do not drive for 24 hours if you were given a sedative during your procedure. °General instructions °Take over-the-counter and prescription medicines only as told by your health care provider. °Do not take baths, swim, or use a hot tub until your health care provider approves. Ask your health care provider if you may take showers. You may only be allowed to take sponge baths. °If directed, put ice on the affected area. To do this: °Put ice in a plastic bag. °Place a towel between your skin and the bag. °Leave the ice on for 20 minutes, 2-3 times a  day. °Keep all follow-up visits as told by your health care provider. This is important.   °Contact a health care provider if: °Your pain is not controlled with medicine. °You have a fever. °You have more redness, swelling, or pain around the puncture site. °You have fluid or blood coming from the puncture site. °Your puncture site feels warm to the touch. °You have pus or a bad smell coming from the puncture site. °Summary °After the procedure, it is common to have mild pain, tenderness, swelling, and bruising. °Follow instructions from your health care provider about how to take care of the puncture site and what activities are safe for you. °Take over-the-counter and prescription medicines only as told by your health care provider. °Contact a health care provider if you have any signs of infection, such as fluid or blood coming from the puncture site. °This information is not intended to replace advice given to you by your health care provider. Make sure you discuss any questions you have with your health care provider. °Document Revised: 03/29/2019 Document Reviewed: 03/29/2019 °Elsevier Patient Education © 2021 Elsevier Inc. ° ° °Moderate Conscious Sedation, Adult, Care After °This sheet gives you information about how to care for yourself after your procedure. Your health care provider may also give you more specific instructions. If you have problems or questions, contact your health care provider. °What can I expect after the procedure? °After the procedure, it is common to have: °Sleepiness for several hours. °Impaired judgment for several hours. °Difficulty with balance. °Vomiting if you eat too   can I expect after the procedure? After the procedure, it is common to have:  Sleepiness for several hours.  Impaired judgment for several hours.  Difficulty with balance.  Vomiting if you eat too soon. Follow these instructions at home: For the time period you were told by your health care provider:  Rest.  Do not participate in activities where you could fall or become injured.  Do not drive or use machinery.  Do not drink  alcohol.  Do not take sleeping pills or medicines that cause drowsiness.  Do not make important decisions or sign legal documents.  Do not take care of children on your own.      Eating and drinking  Follow the diet recommended by your health care provider.  Drink enough fluid to keep your urine pale yellow.  If you vomit: ? Drink water, juice, or soup when you can drink without vomiting. ? Make sure you have little or no nausea before eating solid foods.   General instructions  Take over-the-counter and prescription medicines only as told by your health care provider.  Have a responsible adult stay with you for the time you are told. It is important to have someone help care for you until you are awake and alert.  Do not smoke.  Keep all follow-up visits as told by your health care provider. This is important. Contact a health care provider if:  You are still sleepy or having trouble with balance after 24 hours.  You feel light-headed.  You keep feeling nauseous or you keep vomiting.  You develop a rash.  You have a fever.  You have redness or swelling around the IV site. Get help right away if:  You have trouble breathing.  You have new-onset confusion at home. Summary  After the procedure, it is common to feel sleepy, have impaired judgment, or feel nauseous if you eat too soon.  Rest after you get home. Know the things you should not do after the procedure.  Follow the diet recommended by your health care provider and drink enough fluid to keep your urine pale yellow.  Get help right away if you have trouble breathing or new-onset confusion at home. This information is not intended to replace advice given to you by your health care provider. Make sure you discuss any questions you have with your health care provider. Document Revised: 03/09/2020 Document Reviewed: 10/06/2019 Elsevier Patient Education  2021 [employer].

## 2021-01-23 ENCOUNTER — Telehealth: Payer: Self-pay | Admitting: *Deleted

## 2021-01-23 LAB — SURGICAL PATHOLOGY

## 2021-01-23 NOTE — Telephone Encounter (Signed)
TCT patient regarding results of her bone marrow biopsy. Dr. Lorenso Courier would like to see her in clinic on Friday, 01/25/21 11:30 am to discuss results of bone marrow biopsy.  No answer but was able to leave vm message about appt time

## 2021-01-24 ENCOUNTER — Other Ambulatory Visit: Payer: Self-pay | Admitting: Hematology and Oncology

## 2021-01-24 ENCOUNTER — Encounter: Payer: Self-pay | Admitting: Family Medicine

## 2021-01-24 DIAGNOSIS — M899 Disorder of bone, unspecified: Secondary | ICD-10-CM

## 2021-01-24 NOTE — Progress Notes (Signed)
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 Concerning for multiple myeloma vs metastatic disease 12/31/2020: establish care with Dr. Lorenso Courier. UPEP showed marked Bence Jones protein, findings concerning for multiple myleoma 01/21/2021: bone marrow biopsy confirms multiple myeloma with atypical plasma cells representing 28% of all cells in the aspirate   Interval History:  Doris Wilkerson 75 y.o. female with medical history significant for lambda light chain multiple myeloma who presents for a follow up visit. The patient's last visit was on 12/31/2020. In the interim since the last visit she has had a bone marrow biopsy and additional testing which has confirmed the diagnosis of multiple myeloma.   On exam today Mrs. Delsanto ports that he tolerated the bone marrow biopsy well without any residual pain, bleeding, or bruising.  She does that otherwise her health has been stable compared to her last visit last month.  She otherwise denies any fevers, chills, sweats, nausea, vomiting or diarrhea.  The bulk of our discussion today focused on the diagnosis of lambda light chain multiple myeloma and the treatment options moving forward.  This conversation is detailed below  MEDICAL HISTORY:  Past Medical History:  Diagnosis Date  . Asthma   . Cataract   . GERD (gastroesophageal reflux disease)   . Hyperlipidemia   . Hypertension   . Thyroid disease     SURGICAL HISTORY: Past Surgical History:  Procedure Laterality Date  . Cataract Surgery    . CORONARY ANGIOPLASTY WITH STENT PLACEMENT    . EYE  SURGERY    . ORTHOPEDIC SURGERY  2012  . TONSILLECTOMY  1951    SOCIAL HISTORY: Social History   Socioeconomic History  . Marital status: Married    Spouse name: Not on file  . Number of children: 0  . Years of education: Not on file  . Highest education level: Not on file  Occupational History  . Occupation: unemployed  Tobacco Use  . Smoking status: Never Smoker  . Smokeless tobacco: Never Used  Vaping Use  . Vaping Use: Never used  Substance and Sexual Activity  . Alcohol use: Yes    Alcohol/week: 1.0 standard drink    Types: 1 Glasses of wine per week    Comment: occassionally  . Drug use: No  . Sexual activity: Not Currently  Other Topics Concern  . Not on file  Social History Narrative   Married. Education: college. Pt does exercise-   Social Determinants of Health   Financial Resource Strain: Not on file  Food Insecurity: Not on file  Transportation Needs: Not on file  Physical Activity: Not on file  Stress: Not on file  Social Connections: Not on file  Intimate Partner Violence: Not on file    FAMILY HISTORY: Family History  Problem Relation Age of Onset  . Cancer Father   . Heart disease Brother   . Dementia Brother   . Leukemia Brother   . Breast cancer Neg Hx   . Colon cancer Neg Hx   . Esophageal cancer Neg Hx   . Pancreatic cancer Neg Hx   . Rectal cancer Neg Hx   . Stomach cancer Neg  Hx     ALLERGIES:  is allergic to poison ivy extract, plavix [clopidogrel bisulfate], and other.  MEDICATIONS:  Current Outpatient Medications  Medication Sig Dispense Refill  . acetaminophen (TYLENOL 8 HOUR ARTHRITIS PAIN) 650 MG CR tablet Take 1 tablet (650 mg total) by mouth every 8 (eight) hours as needed for pain.    Marland Kitchen acetaminophen (TYLENOL) 500 MG tablet Take 1 tablet (500 mg total) by mouth every 8 (eight) hours as needed. 30 tablet 0  . aspirin 81 MG tablet Take 81 mg by mouth daily.     Marland Kitchen atorvastatin (LIPITOR) 80 MG tablet Take 1 tablet (80 mg  total) by mouth daily at 6 PM. 90 tablet 3  . benazepril (LOTENSIN) 10 MG tablet TAKE 1 TABLET(10 MG) BY MOUTH DAILY 90 tablet 3  . calcium carbonate (OS-CAL) 1250 (500 Ca) MG chewable tablet Chew 1 tablet by mouth daily.    Marland Kitchen DM-APAP-CPM (CORICIDIN HBP PO) Take by mouth.     . lenalidomide (REVLIMID) 25 MG capsule Take 1 capsule (25 mg total) by mouth daily. Take 1 capsule daily for 14 days; then none for 7 days. Repeat every 21 days   [employer] Auth # [id number]    Date Obtained 01/25/21 14 capsule 0  . levothyroxine (SYNTHROID) 25 MCG tablet TAKE 1 TABLET(25 MCG) BY MOUTH DAILY BEFORE BREAKFAST (Patient taking differently: 630 mcg. TAKE 1 TABLET(25 MCG) BY MOUTH DAILY BEFORE BREAKFAST) 90 tablet 3  . meclizine (ANTIVERT) 25 MG tablet Take 1 tablet (25 mg total) by mouth 3 (three) times daily as needed for dizziness. 30 tablet 0  . NON FORMULARY 1,000 mg. CBD oil    . OVER THE COUNTER MEDICATION Vitamin D 2000iu daily.     . vitamin B-12 (CYANOCOBALAMIN) 250 MCG tablet Take 250 mcg by mouth daily.     No current facility-administered medications for this visit.    REVIEW OF SYSTEMS:   Constitutional: ( - ) fevers, ( - )  chills , ( - ) night sweats Eyes: ( - ) blurriness of vision, ( - ) double vision, ( - ) watery eyes Ears, nose, mouth, throat, and face: ( - ) mucositis, ( - ) sore throat Respiratory: ( - ) cough, ( - ) dyspnea, ( - ) wheezes Cardiovascular: ( - ) palpitation, ( - ) chest discomfort, ( - ) lower extremity swelling Gastrointestinal:  ( - ) nausea, ( - ) heartburn, ( - ) change in bowel habits Skin: ( - ) abnormal skin rashes Lymphatics: ( - ) new lymphadenopathy, ( - ) easy bruising Neurological: ( - ) numbness, ( - ) tingling, ( - ) new weaknesses Behavioral/Psych: ( - ) mood change, ( - ) new changes  All other systems were reviewed with the patient and are negative.  PHYSICAL EXAMINATION: ECOG PERFORMANCE STATUS: 1 - Symptomatic but completely ambulatory  Vitals:    01/25/21 1139  BP: (!) 148/82  Pulse: 98  Resp: 16  Temp: 98.9 F (37.2 C)  SpO2: 100%   Filed Weights   01/25/21 1139  Weight: 152 lb 9.6 oz (69.2 kg)    GENERAL: well appearing elderly Caucasian female in NAD  SKIN: skin color, texture, turgor are normal, no rashes or significant lesions EYES: conjunctiva are pink and non-injected, sclera clear LUNGS: clear to auscultation and percussion with normal breathing effort HEART: regular rate & rhythm and no murmurs and no lower extremity edema Musculoskeletal: no cyanosis of digits and no clubbing  PSYCH: alert &  oriented x 3, fluent speech NEURO: no focal motor/sensory deficits  LABORATORY DATA:  I have reviewed the data as listed CBC Latest Ref Rng & Units 01/25/2021 2021/02/11 12/31/2020  WBC 4.0 - 10.5 K/uL 6.0 5.7 5.1  Hemoglobin 12.0 - 15.0 g/dL 11.5(L) 11.3(L) 10.9(L)  Hematocrit 36.0 - 46.0 % 35.2(L) 35.0(L) 32.5(L)  Platelets 150 - 400 K/uL 257 243 225    CMP Latest Ref Rng & Units 01/25/2021 12/31/2020 12/06/2020  Glucose 70 - 99 mg/dL 75 101(H) 97  BUN 8 - 23 mg/dL '16 14 14  ' Creatinine 0.44 - 1.00 mg/dL 0.91 0.79 0.79  Sodium 135 - 145 mmol/L 141 141 140  Potassium 3.5 - 5.1 mmol/L 3.9 4.0 4.4  Chloride 98 - 111 mmol/L 106 109 103  CO2 22 - 32 mmol/L '25 24 24  ' Calcium 8.9 - 10.3 mg/dL 10.4(H) 9.9 10.4(H)  Total Protein 6.5 - 8.1 g/dL 7.7 7.0 -  Total Bilirubin 0.3 - 1.2 mg/dL 0.5 0.5 -  Alkaline Phos 38 - 126 U/L 101 70 -  AST 15 - 41 U/L 24 22 -  ALT 0 - 44 U/L 20 19 -    Lab Results  Component Value Date   MPROTEIN Not Observed 12/31/2020   Lab Results  Component Value Date   KPAFRELGTCHN 10.9 12/31/2020   LAMBDASER 3,747.4 (H) 12/31/2020   KAPLAMBRATIO 0.01 (L) 01/02/2021   KAPLAMBRATIO 0.00 (L) 12/31/2020    RADIOGRAPHIC STUDIES: CT Biopsy  Result Date: 02-11-21 INDICATION: Abnormal SPEP/UPEP and lytic bone lesions suspicious for Multiple Myeloma EXAM: CT GUIDED BONE MARROW ASPIRATES AND BIOPSY  MEDICATIONS: None. ANESTHESIA/SEDATION: Fentanyl 100 mcg IV; Versed 1 mg IV Moderate Sedation Time:  10 minutes The patient was continuously monitored during the procedure by the interventional radiology nurse under my direct supervision. COMPLICATIONS: None immediate. PROCEDURE: The procedure was explained to the patient. The risks and benefits of the procedure were discussed and the patient's questions were addressed. Informed consent was obtained from the patient. The patient was placed prone on CT table. Images of the pelvis were obtained. The right side of back was prepped and draped in sterile fashion. The skin and right posterior ilium were anesthetized with 1% lidocaine. 11 gauge bone needle was directed into the right ilium with CT guidance. Two aspirates and 1 core biopsy were obtained. Bandage placed over the puncture site. IMPRESSION: CT guided bone marrow aspiration and core biopsy. Electronically Signed   By: Miachel Roux M.D.   On: 02-11-21 10:22   DG Bone Survey Met  Result Date: 01/11/2021 CLINICAL DATA:  75 year old female with recent diagnosis of multiple myeloma. EXAM: METASTATIC BONE SURVEY COMPARISON:  Hip radiographs 12/06/2020 and earlier. FINDINGS: Calvarium bone mineralization appears normal. Cervical vertebral bone mineralization and vertebral height appear normal. Degenerative straightening of cervical lordosis. Disc and endplate degeneration V6-P2 through C6-C7. Normal thoracic segmentation. Mild levoconvex upper thoracic scoliosis is chronic but increased since 2012. Osteopenia in the thoracic spine. But no thoracic spine compression fracture. Normal thoracic disc spaces for age. Normal lumbar segmentation. Mild lumbar scoliosis. Mild degenerative retrolisthesis of L2 on L3 associated with severe disc space loss, endplate spurring and sclerosis. Moderate lower lumbar facet hypertrophy. No lumbar compression fracture. Relatively preserved disc spaces elsewhere. Lung volumes are  chronically at the upper limits of normal to mildly hyperinflated. Mediastinal contours remain normal. No acute pulmonary abnormality. No rib lesion identified. Stable pelvis bone mineralization from last month. No discrete pelvis bone lesion. Mild hip joint space loss. Abdominal  and pelvic visceral contours appear within normal limits. Mild for age vascular calcifications. Bilateral extremity bone mineralization appears decreased, osteopenia. No extremity lytic lesion identified. IMPRESSION: 1. Generalized osteopenia with no focal destructive or suspicious bone lesion identified radiographically. 2. Disc and endplate degeneration in the cervical spine, lumbar spine. Degenerative spondylolisthesis at L2-L3. Electronically Signed   By: Genevie Ann M.D.   On: 01/11/2021 08:21   CT BONE MARROW BIOPSY & ASPIRATION  Result Date: 01/21/2021 INDICATION: Abnormal SPEP/UPEP and lytic bone lesions suspicious for Multiple Myeloma EXAM: CT GUIDED BONE MARROW ASPIRATES AND BIOPSY MEDICATIONS: None. ANESTHESIA/SEDATION: Fentanyl 100 mcg IV; Versed 1 mg IV Moderate Sedation Time:  10 minutes The patient was continuously monitored during the procedure by the interventional radiology nurse under my direct supervision. COMPLICATIONS: None immediate. PROCEDURE: The procedure was explained to the patient. The risks and benefits of the procedure were discussed and the patient's questions were addressed. Informed consent was obtained from the patient. The patient was placed prone on CT table. Images of the pelvis were obtained. The right side of back was prepped and draped in sterile fashion. The skin and right posterior ilium were anesthetized with 1% lidocaine. 11 gauge bone needle was directed into the right ilium with CT guidance. Two aspirates and 1 core biopsy were obtained. Bandage placed over the puncture site. IMPRESSION: CT guided bone marrow aspiration and core biopsy. Electronically Signed   By: Miachel Roux M.D.   On:  01/21/2021 10:22    ASSESSMENT & PLAN Brittni Hult 75 y.o. female with medical history significant for lambda light chain multiple myeloma who presents for a follow up visit.   After review the labs, review the records, schedule the patient the findings most consistent with a lambda light chain multiple myeloma.  This is getting confirmed with a bone marrow biopsy showing an abnormal plasma cell population of 28% and M protein/Bence-Jones proteins in the urine.  Given these findings the patient now carries a diagnosis of lambda light chain multiple myeloma.  R-ISS: Stage II (genetics pending)  We will plan to proceed with VRD chemotherapy.  This consists of bortezomib 1.3 mg per metered squared q week, lenalidomide 25 mg p.o. x14 days of 21-day cycle, and dexamethasone 40 mg p.o. q. weekly  # Lambda Light Chain Multiple Myeloma --diagnosis confirmed with bone marrow biopsy and urine protein  --patient has good functional status and would be an excellent candidate for VRd chemotherapy --will plan to start chemotherapy as soon as she receives the revlimid, at the earliest next Friday --assure monthly restaging labs with SPEP, UPEP, and SFLC --patient declines port placement at this time --Plan to return to clinic in 7-14 days for initiation of VRd chemotherapy.  #Supportive Care --chemotherapy education to be scheduled  --patient declines port placement at this time --zofran 38m q8H PRN and compazine 169mPO q6H for nausea --acyclovir 40081mO BID for VCZ prophylaxis --zometa therapy to be scheduled following dental clearance -- no pain medication required at this time.   No orders of the defined types were placed in this encounter.  All questions were answered. The patient knows to call the clinic with any problems, questions or concerns.  A total of more than 30 minutes were spent on this encounter and over half of that time was spent on counseling and coordination of care as  outlined above.   JohLedell PeoplesD Department of Hematology/Oncology [facility]one: [phone]: [phone]  Email: [email or web address]  01/27/2021 3:39 PM   Literature Support:  Bennett Scrape Bortezomib, lenalidomide, and dexamethasone in transplant-eligible newly diagnosed multiple myeloma patients: a multicenter retrospective comparative analysis. Int Hinton Dyer. 2020 Jan;111(1):103-111. doi: 10.1007/s12185-019-02764-1.  -- Overall response, very good partial response, and complete response rates after VRD were 96.4%, 45.5%, and 20.0%, respectively (median follow-up period, 17.7 months). The 1-year progression-free survival (PFS) and overall survival rates were 95.8% and 98.2%, respectively. The response rate and PFS were similar between the groups, regardless of cytogenetic risk and age.

## 2021-01-25 ENCOUNTER — Telehealth: Payer: Self-pay | Admitting: Pharmacist

## 2021-01-25 ENCOUNTER — Telehealth: Payer: Self-pay

## 2021-01-25 ENCOUNTER — Other Ambulatory Visit: Payer: Self-pay | Admitting: *Deleted

## 2021-01-25 ENCOUNTER — Inpatient Hospital Stay: Payer: Medicare Other

## 2021-01-25 ENCOUNTER — Inpatient Hospital Stay: Payer: Medicare Other | Attending: Hematology and Oncology | Admitting: Hematology and Oncology

## 2021-01-25 ENCOUNTER — Other Ambulatory Visit: Payer: Self-pay

## 2021-01-25 VITALS — BP 148/82 | HR 98 | Temp 98.9°F | Resp 16 | Ht 66.0 in | Wt 152.6 lb

## 2021-01-25 DIAGNOSIS — I10 Essential (primary) hypertension: Secondary | ICD-10-CM | POA: Insufficient documentation

## 2021-01-25 DIAGNOSIS — Z79899 Other long term (current) drug therapy: Secondary | ICD-10-CM | POA: Insufficient documentation

## 2021-01-25 DIAGNOSIS — Z7982 Long term (current) use of aspirin: Secondary | ICD-10-CM | POA: Diagnosis not present

## 2021-01-25 DIAGNOSIS — C9000 Multiple myeloma not having achieved remission: Secondary | ICD-10-CM | POA: Diagnosis not present

## 2021-01-25 DIAGNOSIS — Z5112 Encounter for antineoplastic immunotherapy: Secondary | ICD-10-CM | POA: Insufficient documentation

## 2021-01-25 DIAGNOSIS — Z809 Family history of malignant neoplasm, unspecified: Secondary | ICD-10-CM | POA: Diagnosis not present

## 2021-01-25 DIAGNOSIS — E079 Disorder of thyroid, unspecified: Secondary | ICD-10-CM | POA: Diagnosis not present

## 2021-01-25 DIAGNOSIS — M899 Disorder of bone, unspecified: Secondary | ICD-10-CM | POA: Diagnosis not present

## 2021-01-25 DIAGNOSIS — Z8249 Family history of ischemic heart disease and other diseases of the circulatory system: Secondary | ICD-10-CM | POA: Diagnosis not present

## 2021-01-25 DIAGNOSIS — Z7189 Other specified counseling: Secondary | ICD-10-CM | POA: Diagnosis not present

## 2021-01-25 LAB — LACTATE DEHYDROGENASE: LDH: 216 U/L — ABNORMAL HIGH (ref 98–192)

## 2021-01-25 LAB — CBC WITH DIFFERENTIAL (CANCER CENTER ONLY)
Abs Immature Granulocytes: 0.02 10*3/uL (ref 0.00–0.07)
Basophils Absolute: 0.1 10*3/uL (ref 0.0–0.1)
Basophils Relative: 1 %
Eosinophils Absolute: 0.2 10*3/uL (ref 0.0–0.5)
Eosinophils Relative: 4 %
HCT: 35.2 % — ABNORMAL LOW (ref 36.0–46.0)
Hemoglobin: 11.5 g/dL — ABNORMAL LOW (ref 12.0–15.0)
Immature Granulocytes: 0 %
Lymphocytes Relative: 30 %
Lymphs Abs: 1.8 10*3/uL (ref 0.7–4.0)
MCH: 29.9 pg (ref 26.0–34.0)
MCHC: 32.7 g/dL (ref 30.0–36.0)
MCV: 91.4 fL (ref 80.0–100.0)
Monocytes Absolute: 0.7 10*3/uL (ref 0.1–1.0)
Monocytes Relative: 11 %
Neutro Abs: 3.2 10*3/uL (ref 1.7–7.7)
Neutrophils Relative %: 54 %
Platelet Count: 257 10*3/uL (ref 150–400)
RBC: 3.85 MIL/uL — ABNORMAL LOW (ref 3.87–5.11)
RDW: 14.8 % (ref 11.5–15.5)
WBC Count: 6 10*3/uL (ref 4.0–10.5)
nRBC: 0 % (ref 0.0–0.2)

## 2021-01-25 LAB — CMP (CANCER CENTER ONLY)
ALT: 20 U/L (ref 0–44)
AST: 24 U/L (ref 15–41)
Albumin: 4.6 g/dL (ref 3.5–5.0)
Alkaline Phosphatase: 101 U/L (ref 38–126)
Anion gap: 10 (ref 5–15)
BUN: 16 mg/dL (ref 8–23)
CO2: 25 mmol/L (ref 22–32)
Calcium: 10.4 mg/dL — ABNORMAL HIGH (ref 8.9–10.3)
Chloride: 106 mmol/L (ref 98–111)
Creatinine: 0.91 mg/dL (ref 0.44–1.00)
GFR, Estimated: >60 mL/min (ref >60)
Glucose, Bld: 75 mg/dL (ref 70–99)
Potassium: 3.9 mmol/L (ref 3.5–5.1)
Sodium: 141 mmol/L (ref 135–145)
Total Bilirubin: 0.5 mg/dL (ref 0.3–1.2)
Total Protein: 7.7 g/dL (ref 6.5–8.1)

## 2021-01-25 MED ORDER — LENALIDOMIDE 25 MG PO CAPS: 25.0000 mg | ORAL_CAPSULE | Freq: Every day | ORAL | 0 refills | Status: DC

## 2021-01-25 NOTE — Progress Notes (Signed)
Revlimid enrollment-patient/physician agreement forms completed with patient. Provided teaching materials to patient. Forms fax'd to [employer]  Revlimid enrollment with [employer] is completed. Prescription for Revlimid has been given to Oral chemo pharmacist for further processing.

## 2021-01-25 NOTE — Telephone Encounter (Signed)
Oral Oncology Patient Advocate Encounter  Prior Authorization for Revlimid has been approved.    PA# [id number] Effective dates: 01/25/21 through 11/23/21  Patients co-pay is $2999  Oral Oncology Clinic will continue to follow.   [place] Patient [place] Phone [phone] Fax [phone] 01/25/2021 3:32 PM

## 2021-01-25 NOTE — Telephone Encounter (Signed)
Oral Oncology Patient Advocate Encounter  Received notification from [place] that prior authorization for Revlimid is required.  PA submitted on CoverMyMeds Key [id number] Status is pending  Oral Oncology Clinic will continue to follow.   [place] Patient [place] Phone [phone] Fax [phone] 01/25/2021 3:27 PM

## 2021-01-27 ENCOUNTER — Encounter: Payer: Self-pay | Admitting: Hematology and Oncology

## 2021-01-27 DIAGNOSIS — C9000 Multiple myeloma not having achieved remission: Secondary | ICD-10-CM | POA: Insufficient documentation

## 2021-01-27 NOTE — Progress Notes (Signed)
START ON PATHWAY REGIMEN - Multiple Myeloma and Other Plasma Cell Dyscrasias     A cycle is every 21 days:     Bortezomib      Lenalidomide      Dexamethasone   **Always confirm dose/schedule in your pharmacy ordering system**  Patient Characteristics: Multiple Myeloma, Newly Diagnosed, Transplant Ineligible or Refused, Unknown or Awaiting Test Results Disease Classification: Multiple Myeloma R-ISS Staging: II Therapeutic Status: Newly Diagnosed Is Patient Eligible for Transplant<= Transplant Ineligible or Refused Risk Status: Awaiting Test Results Intent of Therapy: Non-Curative / Palliative Intent, Discussed with Patient

## 2021-01-28 MED ORDER — LENALIDOMIDE 25 MG PO CAPS: 25.0000 mg | ORAL_CAPSULE | Freq: Every day | ORAL | 0 refills | Status: DC

## 2021-01-28 NOTE — Telephone Encounter (Signed)
Oral Oncology Pharmacist Encounter  Received new prescription for Revlimid (lenalidomide) for the treatment of multiple myeloma in conjunction with Velcade and dexamethasone, planned duration until disease progression or unacceptable drug toxicity.  Prescription dose and frequency assessed for appropriateness. Appropriate for therapy initiation.   CBC w/ Diff and CMP from 01/25/21 assessed, labs stable for treatment initiation.  Current medication list in Epic reviewed, no relevant/significant DDIs with Revlimid identified.  Evaluated chart and no patient barriers to medication adherence noted.   Patient agreement for Revlimid treatment documented in treatment plan consent attestation on 01/27/21.  Prescription required to be filled through [facility]. Prescription redirected to [facility] for dispensing.  Oral Oncology Clinic will continue to follow for insurance authorization, copayment issues, initial counseling and start date.  Leron Croak, PharmD, BCPS Hematology/Oncology Clinical Pharmacist [facility] [phone] 01/28/2021 8:36 AM

## 2021-01-29 ENCOUNTER — Encounter: Payer: Self-pay | Admitting: Hematology and Oncology

## 2021-01-30 ENCOUNTER — Telehealth: Payer: Self-pay | Admitting: Hematology and Oncology

## 2021-01-30 ENCOUNTER — Encounter: Payer: Self-pay | Admitting: *Deleted

## 2021-01-30 ENCOUNTER — Other Ambulatory Visit: Payer: Self-pay | Admitting: Hematology and Oncology

## 2021-01-30 MED ORDER — PROCHLORPERAZINE MALEATE 10 MG PO TABS: 10.0000 mg | ORAL_TABLET | Freq: Four times a day (QID) | ORAL | 0 refills | Status: DC | PRN

## 2021-01-30 MED ORDER — DEXAMETHASONE 4 MG PO TABS: 40.0000 mg | ORAL_TABLET | ORAL | 3 refills | Status: DC

## 2021-01-30 MED ORDER — ACYCLOVIR 400 MG PO TABS: 400.0000 mg | ORAL_TABLET | Freq: Two times a day (BID) | ORAL | 5 refills | Status: DC

## 2021-01-30 MED ORDER — ONDANSETRON HCL 8 MG PO TABS: 8.0000 mg | ORAL_TABLET | Freq: Three times a day (TID) | ORAL | 0 refills | Status: DC | PRN

## 2021-01-30 NOTE — Telephone Encounter (Signed)
Oral Chemotherapy Pharmacist Encounter  I spoke with patient for overview of: Revlimid for the treatment of multiple myeloma in conjunction with Velcade and dexamethasone, planned duration until disease progression or unacceptable drug toxicity.   Counseled patient on administration, dosing, side effects, monitoring, drug-food interactions, safe handling, storage, and disposal.  Patient will take Revlimid 3m capsules, 1 capsule by mouth once daily, without regard to food, with a full glass of water.  Revlimid will be given 14 days on, 7 days off, repeat every 21 days.  Patient will take dexamethasone 452mtablets, 10 tablets (408mby mouth once weekly with breakfast.  Revlimid start date: 02/01/21  Adverse effects of Revlimid include but are not limited to: nausea, constipation, diarrhea, abdominal pain, rash, fatigue, drug fever, and decreased blood counts.    Reviewed with patient importance of keeping a medication schedule and plan for any missed doses. No barriers to medication adherence identified.  Medication reconciliation performed and medication/allergy list updated.  Patient will pick up acyclovir and dexamethasone prescriptions and will wait until cycle 1 day 1 prior to starting dexamethasone. Patient counseled on importance of daily aspirin 66m48mr VTE prophylaxis.  Insurance authorization for Revlimid has been obtained.  Revlimid prescription is being dispensed from [employer] is a limited distribution medication.  All questions answered.  Ms. Mckain Riefced understanding and appreciation.   Medication education handout and medication calendar will be given to patient on Friday in clinic during her first treatment. Patient knows to call the office with questions or concerns. Oral Chemotherapy Clinic phone number provided to patient.   RebeLeron CroakarmD, BCPS Hematology/Oncology Clinical Pharmacist [facility]-[phone]/2022 4:21 PM

## 2021-01-30 NOTE — Telephone Encounter (Signed)
Left message with upcoming appointment per 3/9 schedule message. Gave option to call back to reschedule if needed.

## 2021-01-31 ENCOUNTER — Other Ambulatory Visit: Payer: Self-pay

## 2021-01-31 ENCOUNTER — Encounter (HOSPITAL_COMMUNITY): Payer: Self-pay | Admitting: Hematology and Oncology

## 2021-01-31 ENCOUNTER — Inpatient Hospital Stay: Payer: Medicare Other

## 2021-01-31 ENCOUNTER — Encounter: Payer: Self-pay | Admitting: Hematology and Oncology

## 2021-01-31 NOTE — Progress Notes (Signed)
Pharmacist Chemotherapy Monitoring - Initial Assessment    Anticipated start date: 02/01/21  Regimen:  . Are orders appropriate based on the patient's diagnosis, regimen, and cycle? Yes . Does the plan date match the patient's scheduled date? Yes . Is the sequencing of drugs appropriate? Yes . Are the premedications appropriate for the patient's regimen? Yes . Prior Authorization for treatment is: Approved o If applicable, is the correct biosimilar selected based on the patient's insurance? not applicable  Organ Function and Labs: Marland Kitchen Are dose adjustments needed based on the patient's renal function, hepatic function, or hematologic function? Yes . Are appropriate labs ordered prior to the start of patient's treatment? Yes . Other organ system assessment, if indicated: N/A . The following baseline labs, if indicated, have been ordered: N/A  Dose Assessment: . Are the drug doses appropriate? Yes . Are the following correct: o Drug concentrations Yes o IV fluid compatible with drug Yes o Administration routes Yes o Timing of therapy Yes . If applicable, does the patient have documented access for treatment and/or plans for port-a-cath placement? not applicable . If applicable, have lifetime cumulative doses been properly documented and assessed? not applicable Lifetime Dose Tracking  No doses have been documented on this patient for the following tracked chemicals: Doxorubicin, Epirubicin, Idarubicin, Daunorubicin, Mitoxantrone, Bleomycin, Oxaliplatin, Carboplatin, Liposomal Doxorubicin  o   Toxicity Monitoring/Prevention: . The patient has the following take home antiemetics prescribed: Prochlorperazine . The patient has the following take home medications prescribed: VZV prophylaxis . Medication allergies and previous infusion related reactions, if applicable, have been reviewed and addressed. Yes . The patient's current medication list has been assessed for drug-drug interactions  with their chemotherapy regimen. no significant drug-drug interactions were identified on review.  Order Review: . Are the treatment plan orders signed? Yes . Is the patient scheduled to see a provider prior to their treatment? No  I verify that I have reviewed each item in the above checklist and answered each question accordingly.  Philomena Course, Adeline, 01/31/2021  11:44 AM

## 2021-02-01 ENCOUNTER — Telehealth: Payer: Self-pay

## 2021-02-01 ENCOUNTER — Other Ambulatory Visit: Payer: Self-pay

## 2021-02-01 ENCOUNTER — Inpatient Hospital Stay: Payer: Medicare Other

## 2021-02-01 VITALS — BP 134/68 | HR 98 | Temp 98.1°F | Resp 16 | Wt 153.4 lb

## 2021-02-01 DIAGNOSIS — C9000 Multiple myeloma not having achieved remission: Secondary | ICD-10-CM | POA: Diagnosis not present

## 2021-02-01 DIAGNOSIS — Z79899 Other long term (current) drug therapy: Secondary | ICD-10-CM | POA: Diagnosis not present

## 2021-02-01 DIAGNOSIS — I10 Essential (primary) hypertension: Secondary | ICD-10-CM | POA: Diagnosis not present

## 2021-02-01 DIAGNOSIS — Z7982 Long term (current) use of aspirin: Secondary | ICD-10-CM | POA: Diagnosis not present

## 2021-02-01 DIAGNOSIS — Z5112 Encounter for antineoplastic immunotherapy: Secondary | ICD-10-CM | POA: Diagnosis not present

## 2021-02-01 DIAGNOSIS — E079 Disorder of thyroid, unspecified: Secondary | ICD-10-CM | POA: Diagnosis not present

## 2021-02-01 MED ORDER — DEXAMETHASONE 4 MG PO TABS: 40.0000 mg | ORAL_TABLET | Freq: Once | ORAL | Status: DC

## 2021-02-01 MED ORDER — BORTEZOMIB CHEMO SQ INJECTION 3.5 MG (2.5MG/ML)
1.3000 mg/m2 | Freq: Once | INTRAMUSCULAR | Status: AC
Start: 2021-02-01 — End: 2021-02-01
  Administered 2021-02-01: 2.25 mg via SUBCUTANEOUS
  Filled 2021-02-01: qty 0.9

## 2021-02-01 NOTE — Patient Instructions (Signed)
[facility] Discharge Instructions for Patients Receiving Chemotherapy  Today you received the following chemotherapy agents: Velcade.  To help prevent nausea and vomiting after your treatment, we encourage you to take your nausea medication as directed.   If you develop nausea and vomiting that is not controlled by your nausea medication, call the clinic.   BELOW ARE SYMPTOMS THAT SHOULD BE REPORTED IMMEDIATELY:  *FEVER GREATER THAN 100.5 F  *CHILLS WITH OR WITHOUT FEVER  NAUSEA AND VOMITING THAT IS NOT CONTROLLED WITH YOUR NAUSEA MEDICATION  *UNUSUAL SHORTNESS OF BREATH  *UNUSUAL BRUISING OR BLEEDING  TENDERNESS IN MOUTH AND THROAT WITH OR WITHOUT PRESENCE OF ULCERS  *URINARY PROBLEMS  *BOWEL PROBLEMS  UNUSUAL RASH Items with * indicate a potential emergency and should be followed up as soon as possible.  Feel free to call the clinic should you have any questions or concerns. The clinic phone number is [phone].  Please show the CHEMO ALERT CARD at check-in to the Emergency Department and triage nurse.  Bortezomib injection What is this medicine? BORTEZOMIB (bor TEZ oh mib) targets proteins in cancer cells and stops the cancer cells from growing. It treats multiple myeloma and mantle cell lymphoma. This medicine may be used for other purposes; ask your health care provider or pharmacist if you have questions. COMMON BRAND NAME(S): Velcade What should I tell my health care provider before I take this medicine? They need to know if you have any of these conditions:  dehydration  diabetes (high blood sugar)  heart disease  liver disease  tingling of the fingers or toes or other nerve disorder  an unusual or allergic reaction to bortezomib, mannitol, boron, other medicines, foods, dyes, or preservatives  pregnant or trying to get pregnant  breast-feeding How should I use this medicine? This medicine is injected into a vein or under the skin.  It is given by a health care provider in a hospital or clinic setting. Talk to your health care provider about the use of this medicine in children. Special care may be needed. Overdosage: If you think you have taken too much of this medicine contact a poison control center or emergency room at once. NOTE: This medicine is only for you. Do not share this medicine with others. What if I miss a dose? Keep appointments for follow-up doses. It is important not to miss your dose. Call your health care provider if you are unable to keep an appointment. What may interact with this medicine? This medicine may interact with the following medications:  ketoconazole  rifampin This list may not describe all possible interactions. Give your health care provider a list of all the medicines, herbs, non-prescription drugs, or dietary supplements you use. Also tell them if you smoke, drink alcohol, or use illegal drugs. Some items may interact with your medicine. What should I watch for while using this medicine? Your condition will be monitored carefully while you are receiving this medicine. You may need blood work done while you are taking this medicine. You may get drowsy or dizzy. Do not drive, use machinery, or do anything that needs mental alertness until you know how this medicine affects you. Do not stand up or sit up quickly, especially if you are an older patient. This reduces the risk of dizzy or fainting spells This medicine may increase your risk of getting an infection. Call your health care provider for advice if you get a fever, chills, sore throat, or other symptoms of a   cold or flu. Do not treat yourself. Try to avoid being around people who are sick. Check with your health care provider if you have severe diarrhea, nausea, and vomiting, or if you sweat a lot. The loss of too much body fluid may make it dangerous for you to take this medicine. Do not become pregnant while taking this medicine or  for 7 months after stopping it. Women should inform their health care provider if they wish to become pregnant or think they might be pregnant. Men should not father a child while taking this medicine and for 4 months after stopping it. There is a potential for serious harm to an unborn child. Talk to your health care provider for more information. Do not breast-feed an infant while taking this medicine or for 2 months after stopping it. This medicine may make it more difficult to get pregnant or father a child. Talk to your health care provider if you are concerned about your fertility. What side effects may I notice from receiving this medicine? Side effects that you should report to your doctor or health care professional as soon as possible:  allergic reactions (skin rash; itching or hives; swelling of the face, lips, or tongue)  bleeding (bloody or black, tarry stools; red or dark brown urine; spitting up blood or brown material that looks like coffee grounds; red spots on the skin; unusual bruising or bleeding from the eye, gums, or nose)  blurred vision or changes in vision  confusion  constipation  headache  heart failure (trouble breathing; fast, irregular heartbeat; sudden weight gain; swelling of the ankles, feet, hands)  infection (fever, chills, cough, sore throat, pain or trouble passing urine)  lack or loss of appetite  liver injury (dark yellow or brown urine; general ill feeling or flu-like symptoms; loss of appetite, right upper belly pain; yellowing of the eyes or skin)  low blood pressure (dizziness; feeling faint or lightheaded, falls; unusually weak or tired)  muscle cramps  pain, redness, or irritation at site where injected  pain, tingling, numbness in the hands or feet  seizures  trouble breathing  unusual bruising or bleeding Side effects that usually do not require medical attention (report to your doctor or health care professional if they continue or  are bothersome):  diarrhea  nausea  stomach pain  trouble sleeping  vomiting This list may not describe all possible side effects. Call your doctor for medical advice about side effects. You may report side effects to FDA at 1-800-FDA-1088. Where should I keep my medicine? This medicine is given in a hospital or clinic. It will not be stored at home. NOTE: This sheet is a summary. It may not cover all possible information. If you have questions about this medicine, talk to your doctor, pharmacist, or health care provider.  2021 Elsevier/Gold Standard (2020-11-01 13:22:53)  

## 2021-02-01 NOTE — Progress Notes (Signed)
Okay to treat with labs from 01/25/21 per Dr. Lorenso Courier.

## 2021-02-01 NOTE — Telephone Encounter (Signed)
Oral Oncology Patient Advocate Encounter  Met patient in lobby to complete application for [place] in an effort to reduce patient's out of pocket expense for Revlimid to $0.    Application completed and faxed to [phone].   [facility] Access Support phone number for follow up is [phone].  This encounter will be updated until final determination.  [place] Patient [place] Phone [phone] Fax [phone] 02/01/2021 10:50 AM

## 2021-02-04 ENCOUNTER — Telehealth: Payer: Self-pay | Admitting: Hematology and Oncology

## 2021-02-04 NOTE — Telephone Encounter (Signed)
Scheduled per 03/14 scheduled message, called patient regarding upcoming scheduled 03/18 appointments.

## 2021-02-05 NOTE — Telephone Encounter (Signed)
Patient is approved for Revlimid at no cost from [facility] 02/04/21-11/23/21  [facility] uses [facility] [employer] for [place] Patient [place] Phone [phone] Fax [phone] 02/05/2021 8:53 AM

## 2021-02-07 ENCOUNTER — Telehealth: Payer: Self-pay | Admitting: *Deleted

## 2021-02-07 ENCOUNTER — Encounter: Payer: Self-pay | Admitting: Hematology and Oncology

## 2021-02-07 NOTE — Telephone Encounter (Signed)
Received vm message from patient stating that felt quite dizzy last evening and 'crumpled ' to the floor. She states no injuries occurred.  She is requesting a call back. TCTpatient and  spoke with her.   She states she had stood up from a sitting position to go to the bathroom and became dizzy and fell to the floor. No loss of consciousness. 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 Her husband was able to help her back up.  She did note that her blood pressure was low at the time - for her @ 101/61. She normally runs much higher.  Advised to increase her fluid intake-she should be drinking 64 oz per day. She does say she is not drinking that much fluid but will increase it now.  She states she took some meclizine this morning for dizzyness. Encouraged her again about drinking fluids rather than taking the meclizine.  She voiced understanding. She is aware of her appts for tomorrow.

## 2021-02-07 NOTE — Progress Notes (Signed)
Called pt to introduce myself as her Arboriculturist and to discuss the [employer].  Pt has 2 insurances so copay assistance shouldn't be needed.  I informed her of the [employer] and went over what it covers but pt declined the grant wanting to leave it for someone in greater need.  I will request the registation staff give her my card for any questions or concerns she may have in the future.

## 2021-02-08 ENCOUNTER — Other Ambulatory Visit: Payer: Self-pay

## 2021-02-08 ENCOUNTER — Inpatient Hospital Stay: Payer: Medicare Other

## 2021-02-08 ENCOUNTER — Other Ambulatory Visit: Payer: Self-pay | Admitting: Hematology and Oncology

## 2021-02-08 VITALS — BP 132/70 | HR 72 | Temp 97.8°F | Resp 16 | Wt 152.0 lb

## 2021-02-08 DIAGNOSIS — E079 Disorder of thyroid, unspecified: Secondary | ICD-10-CM | POA: Diagnosis not present

## 2021-02-08 DIAGNOSIS — C9000 Multiple myeloma not having achieved remission: Secondary | ICD-10-CM

## 2021-02-08 DIAGNOSIS — I10 Essential (primary) hypertension: Secondary | ICD-10-CM | POA: Diagnosis not present

## 2021-02-08 DIAGNOSIS — Z79899 Other long term (current) drug therapy: Secondary | ICD-10-CM | POA: Diagnosis not present

## 2021-02-08 DIAGNOSIS — Z7982 Long term (current) use of aspirin: Secondary | ICD-10-CM | POA: Diagnosis not present

## 2021-02-08 DIAGNOSIS — Z5112 Encounter for antineoplastic immunotherapy: Secondary | ICD-10-CM | POA: Diagnosis not present

## 2021-02-08 LAB — CBC WITH DIFFERENTIAL (CANCER CENTER ONLY)
Abs Immature Granulocytes: 0.08 10*3/uL — ABNORMAL HIGH (ref 0.00–0.07)
Basophils Absolute: 0 10*3/uL (ref 0.0–0.1)
Basophils Relative: 0 %
Eosinophils Absolute: 0.5 10*3/uL (ref 0.0–0.5)
Eosinophils Relative: 5 %
HCT: 31.1 % — ABNORMAL LOW (ref 36.0–46.0)
Hemoglobin: 10.4 g/dL — ABNORMAL LOW (ref 12.0–15.0)
Immature Granulocytes: 1 %
Lymphocytes Relative: 14 %
Lymphs Abs: 1.2 10*3/uL (ref 0.7–4.0)
MCH: 30.2 pg (ref 26.0–34.0)
MCHC: 33.4 g/dL (ref 30.0–36.0)
MCV: 90.4 fL (ref 80.0–100.0)
Monocytes Absolute: 0.7 10*3/uL (ref 0.1–1.0)
Monocytes Relative: 7 %
Neutro Abs: 6.6 10*3/uL (ref 1.7–7.7)
Neutrophils Relative %: 73 %
Platelet Count: 219 10*3/uL (ref 150–400)
RBC: 3.44 MIL/uL — ABNORMAL LOW (ref 3.87–5.11)
RDW: 15.3 % (ref 11.5–15.5)
WBC Count: 9.1 10*3/uL (ref 4.0–10.5)
nRBC: 0 % (ref 0.0–0.2)

## 2021-02-08 LAB — CMP (CANCER CENTER ONLY)
ALT: 23 U/L (ref 0–44)
AST: 19 U/L (ref 15–41)
Albumin: 4.2 g/dL (ref 3.5–5.0)
Alkaline Phosphatase: 88 U/L (ref 38–126)
Anion gap: 10 (ref 5–15)
BUN: 12 mg/dL (ref 8–23)
CO2: 23 mmol/L (ref 22–32)
Calcium: 9.4 mg/dL (ref 8.9–10.3)
Chloride: 104 mmol/L (ref 98–111)
Creatinine: 0.76 mg/dL (ref 0.44–1.00)
GFR, Estimated: >60 mL/min (ref >60)
Glucose, Bld: 94 mg/dL (ref 70–99)
Potassium: 4 mmol/L (ref 3.5–5.1)
Sodium: 137 mmol/L (ref 135–145)
Total Bilirubin: 0.6 mg/dL (ref 0.3–1.2)
Total Protein: 6.9 g/dL (ref 6.5–8.1)

## 2021-02-08 LAB — LACTATE DEHYDROGENASE: LDH: 200 U/L — ABNORMAL HIGH (ref 98–192)

## 2021-02-08 MED ORDER — BORTEZOMIB CHEMO SQ INJECTION 3.5 MG (2.5MG/ML)
1.3000 mg/m2 | Freq: Once | INTRAMUSCULAR | Status: AC
  Administered 2021-02-08: 2.25 mg via SUBCUTANEOUS
  Filled 2021-02-08: qty 0.9

## 2021-02-08 MED ORDER — DEXAMETHASONE 4 MG PO TABS: 40.0000 mg | ORAL_TABLET | Freq: Once | ORAL | Status: DC

## 2021-02-08 NOTE — Progress Notes (Signed)
Patient presented to infusion room c/o worsening intermittent dizziness. States worse with position changes. Fell on 3/16 as a result of dizziness. Patient advises she will f/u with PMD regarding same. Dr. Lorenso Courier aware and agrees.

## 2021-02-08 NOTE — Patient Instructions (Signed)
[facility] Discharge Instructions for Patients Receiving Chemotherapy  Today you received the following chemotherapy agents: bortezomib.  To help prevent nausea and vomiting after your treatment, we encourage you to take your nausea medication as directed.   If you develop nausea and vomiting that is not controlled by your nausea medication, call the clinic.   BELOW ARE SYMPTOMS THAT SHOULD BE REPORTED IMMEDIATELY:  *FEVER GREATER THAN 100.5 F  *CHILLS WITH OR WITHOUT FEVER  NAUSEA AND VOMITING THAT IS NOT CONTROLLED WITH YOUR NAUSEA MEDICATION  *UNUSUAL SHORTNESS OF BREATH  *UNUSUAL BRUISING OR BLEEDING  TENDERNESS IN MOUTH AND THROAT WITH OR WITHOUT PRESENCE OF ULCERS  *URINARY PROBLEMS  *BOWEL PROBLEMS  UNUSUAL RASH Items with * indicate a potential emergency and should be followed up as soon as possible.  Feel free to call the clinic should you have any questions or concerns. The clinic phone number is [phone].  Please show the CHEMO ALERT CARD at check-in to the Emergency Department and triage nurse.   

## 2021-02-11 LAB — KAPPA/LAMBDA LIGHT CHAINS
Kappa free light chain: 12.5 mg/L (ref 3.3–19.4)
Kappa, lambda light chain ratio: 0.01 — ABNORMAL LOW (ref 0.26–1.65)
Lambda free light chains: 1933.7 mg/L — ABNORMAL HIGH (ref 5.7–26.3)

## 2021-02-12 LAB — MULTIPLE MYELOMA PANEL, SERUM
Albumin SerPl Elph-Mcnc: 4.1 g/dL (ref 2.9–4.4)
Albumin/Glob SerPl: 1.6 (ref 0.7–1.7)
Alpha 1: 0.3 g/dL (ref 0.0–0.4)
Alpha2 Glob SerPl Elph-Mcnc: 1.1 g/dL — ABNORMAL HIGH (ref 0.4–1.0)
B-Globulin SerPl Elph-Mcnc: 1 g/dL (ref 0.7–1.3)
Gamma Glob SerPl Elph-Mcnc: 0.4 g/dL (ref 0.4–1.8)
Globulin, Total: 2.7 g/dL (ref 2.2–3.9)
IgA: 42 mg/dL — ABNORMAL LOW (ref 64–422)
IgG (Immunoglobin G), Serum: 445 mg/dL — ABNORMAL LOW (ref 586–1602)
IgM (Immunoglobulin M), Srm: 10 mg/dL — ABNORMAL LOW (ref 26–217)
Total Protein ELP: 6.8 g/dL (ref 6.0–8.5)

## 2021-02-13 ENCOUNTER — Telehealth: Payer: Self-pay | Admitting: *Deleted

## 2021-02-13 ENCOUNTER — Encounter: Payer: Self-pay | Admitting: Hematology and Oncology

## 2021-02-13 NOTE — Telephone Encounter (Signed)
Received vm message from patient requesting a visit with Dr. Lorenso Courier on Friday, 02/15/21 while she is here for her treatment. TCT patient. No answer but was able to leave vm message telling that Dr. Lorenso Courier will not be in the office on Friday. He will return on Monday, 02/18/21 Advised to call back if there was anything she needed help with or to discuss.  Dr. Lorenso Courier made aware of this for next week

## 2021-02-15 ENCOUNTER — Inpatient Hospital Stay: Payer: Medicare Other

## 2021-02-15 ENCOUNTER — Other Ambulatory Visit: Payer: Self-pay | Admitting: *Deleted

## 2021-02-15 ENCOUNTER — Other Ambulatory Visit: Payer: Self-pay

## 2021-02-15 ENCOUNTER — Other Ambulatory Visit: Payer: Self-pay | Admitting: Hematology and Oncology

## 2021-02-15 VITALS — BP 131/67 | HR 76 | Temp 98.7°F | Resp 18 | Wt 152.8 lb

## 2021-02-15 DIAGNOSIS — Z79899 Other long term (current) drug therapy: Secondary | ICD-10-CM | POA: Diagnosis not present

## 2021-02-15 DIAGNOSIS — I10 Essential (primary) hypertension: Secondary | ICD-10-CM | POA: Diagnosis not present

## 2021-02-15 DIAGNOSIS — C9000 Multiple myeloma not having achieved remission: Secondary | ICD-10-CM

## 2021-02-15 DIAGNOSIS — Z7982 Long term (current) use of aspirin: Secondary | ICD-10-CM | POA: Diagnosis not present

## 2021-02-15 DIAGNOSIS — Z5112 Encounter for antineoplastic immunotherapy: Secondary | ICD-10-CM | POA: Diagnosis not present

## 2021-02-15 DIAGNOSIS — E079 Disorder of thyroid, unspecified: Secondary | ICD-10-CM | POA: Diagnosis not present

## 2021-02-15 LAB — CBC WITH DIFFERENTIAL (CANCER CENTER ONLY)
Abs Immature Granulocytes: 0.04 10*3/uL (ref 0.00–0.07)
Basophils Absolute: 0 10*3/uL (ref 0.0–0.1)
Basophils Relative: 1 %
Eosinophils Absolute: 0.7 10*3/uL — ABNORMAL HIGH (ref 0.0–0.5)
Eosinophils Relative: 9 %
HCT: 32 % — ABNORMAL LOW (ref 36.0–46.0)
Hemoglobin: 10.4 g/dL — ABNORMAL LOW (ref 12.0–15.0)
Immature Granulocytes: 1 %
Lymphocytes Relative: 11 %
Lymphs Abs: 0.8 10*3/uL (ref 0.7–4.0)
MCH: 28.9 pg (ref 26.0–34.0)
MCHC: 32.5 g/dL (ref 30.0–36.0)
MCV: 88.9 fL (ref 80.0–100.0)
Monocytes Absolute: 0.9 10*3/uL (ref 0.1–1.0)
Monocytes Relative: 12 %
Neutro Abs: 4.8 10*3/uL (ref 1.7–7.7)
Neutrophils Relative %: 66 %
Platelet Count: 197 10*3/uL (ref 150–400)
RBC: 3.6 MIL/uL — ABNORMAL LOW (ref 3.87–5.11)
RDW: 15.1 % (ref 11.5–15.5)
WBC Count: 7.2 10*3/uL (ref 4.0–10.5)
nRBC: 0 % (ref 0.0–0.2)

## 2021-02-15 LAB — CMP (CANCER CENTER ONLY)
ALT: 24 U/L (ref 0–44)
AST: 17 U/L (ref 15–41)
Albumin: 4 g/dL (ref 3.5–5.0)
Alkaline Phosphatase: 105 U/L (ref 38–126)
Anion gap: 10 (ref 5–15)
BUN: 13 mg/dL (ref 8–23)
CO2: 23 mmol/L (ref 22–32)
Calcium: 9.2 mg/dL (ref 8.9–10.3)
Chloride: 103 mmol/L (ref 98–111)
Creatinine: 0.7 mg/dL (ref 0.44–1.00)
GFR, Estimated: >60 mL/min (ref >60)
Glucose, Bld: 104 mg/dL — ABNORMAL HIGH (ref 70–99)
Potassium: 4.1 mmol/L (ref 3.5–5.1)
Sodium: 136 mmol/L (ref 135–145)
Total Bilirubin: 0.6 mg/dL (ref 0.3–1.2)
Total Protein: 6.6 g/dL (ref 6.5–8.1)

## 2021-02-15 LAB — LACTATE DEHYDROGENASE: LDH: 172 U/L (ref 98–192)

## 2021-02-15 MED ORDER — DEXAMETHASONE 4 MG PO TABS: 40.0000 mg | ORAL_TABLET | Freq: Once | ORAL | Status: DC

## 2021-02-15 MED ORDER — BORTEZOMIB CHEMO SQ INJECTION 3.5 MG (2.5MG/ML)
1.3000 mg/m2 | Freq: Once | INTRAMUSCULAR | Status: AC
Start: 2021-02-15 — End: 2021-02-15
  Administered 2021-02-15: 2.25 mg via SUBCUTANEOUS
  Filled 2021-02-15: qty 0.9

## 2021-02-15 MED ORDER — LENALIDOMIDE 25 MG PO CAPS: 25.0000 mg | ORAL_CAPSULE | Freq: Every day | ORAL | 0 refills | Status: DC

## 2021-02-15 NOTE — Patient Instructions (Signed)
[facility] Discharge Instructions for Patients Receiving Chemotherapy  Today you received the following chemotherapy agents: bortezomib.  To help prevent nausea and vomiting after your treatment, we encourage you to take your nausea medication as directed.   If you develop nausea and vomiting that is not controlled by your nausea medication, call the clinic.   BELOW ARE SYMPTOMS THAT SHOULD BE REPORTED IMMEDIATELY:  *FEVER GREATER THAN 100.5 F  *CHILLS WITH OR WITHOUT FEVER  NAUSEA AND VOMITING THAT IS NOT CONTROLLED WITH YOUR NAUSEA MEDICATION  *UNUSUAL SHORTNESS OF BREATH  *UNUSUAL BRUISING OR BLEEDING  TENDERNESS IN MOUTH AND THROAT WITH OR WITHOUT PRESENCE OF ULCERS  *URINARY PROBLEMS  *BOWEL PROBLEMS  UNUSUAL RASH Items with * indicate a potential emergency and should be followed up as soon as possible.  Feel free to call the clinic should you have any questions or concerns. The clinic phone number is [phone].  Please show the CHEMO ALERT CARD at check-in to the Emergency Department and triage nurse.   

## 2021-02-16 ENCOUNTER — Encounter: Payer: Self-pay | Admitting: Family Medicine

## 2021-02-18 LAB — KAPPA/LAMBDA LIGHT CHAINS
Kappa free light chain: 17.3 mg/L (ref 3.3–19.4)
Kappa, lambda light chain ratio: 0.03 — ABNORMAL LOW (ref 0.26–1.65)
Lambda free light chains: 632.7 mg/L — ABNORMAL HIGH (ref 5.7–26.3)

## 2021-02-18 LAB — MULTIPLE MYELOMA PANEL, SERUM
Albumin SerPl Elph-Mcnc: 4 g/dL (ref 2.9–4.4)
Albumin/Glob SerPl: 1.7 (ref 0.7–1.7)
Alpha 1: 0.2 g/dL (ref 0.0–0.4)
Alpha2 Glob SerPl Elph-Mcnc: 0.9 g/dL (ref 0.4–1.0)
B-Globulin SerPl Elph-Mcnc: 0.8 g/dL (ref 0.7–1.3)
Gamma Glob SerPl Elph-Mcnc: 0.3 g/dL — ABNORMAL LOW (ref 0.4–1.8)
Globulin, Total: 2.4 g/dL (ref 2.2–3.9)
IgA: 41 mg/dL — ABNORMAL LOW (ref 64–422)
IgG (Immunoglobin G), Serum: 416 mg/dL — ABNORMAL LOW (ref 586–1602)
IgM (Immunoglobulin M), Srm: 11 mg/dL — ABNORMAL LOW (ref 26–217)
Total Protein ELP: 6.4 g/dL (ref 6.0–8.5)

## 2021-02-20 ENCOUNTER — Encounter: Payer: Self-pay | Admitting: Hematology and Oncology

## 2021-02-20 ENCOUNTER — Encounter: Payer: Self-pay | Admitting: *Deleted

## 2021-02-20 NOTE — Telephone Encounter (Signed)
Pt is asking about lab results states to be from the 20th please advise.

## 2021-02-20 NOTE — Telephone Encounter (Signed)
disregard pt contacted oncology for results

## 2021-02-21 ENCOUNTER — Inpatient Hospital Stay: Payer: Medicare Other

## 2021-02-21 ENCOUNTER — Other Ambulatory Visit: Payer: Self-pay

## 2021-02-21 ENCOUNTER — Other Ambulatory Visit: Payer: Self-pay | Admitting: Hematology and Oncology

## 2021-02-21 ENCOUNTER — Inpatient Hospital Stay (HOSPITAL_BASED_OUTPATIENT_CLINIC_OR_DEPARTMENT_OTHER): Payer: Medicare Other | Admitting: Hematology and Oncology

## 2021-02-21 VITALS — BP 129/71 | HR 76 | Temp 98.0°F | Resp 18 | Wt 153.8 lb

## 2021-02-21 DIAGNOSIS — M899 Disorder of bone, unspecified: Secondary | ICD-10-CM | POA: Diagnosis not present

## 2021-02-21 DIAGNOSIS — E079 Disorder of thyroid, unspecified: Secondary | ICD-10-CM | POA: Diagnosis not present

## 2021-02-21 DIAGNOSIS — I10 Essential (primary) hypertension: Secondary | ICD-10-CM | POA: Diagnosis not present

## 2021-02-21 DIAGNOSIS — C9000 Multiple myeloma not having achieved remission: Secondary | ICD-10-CM

## 2021-02-21 DIAGNOSIS — Z7982 Long term (current) use of aspirin: Secondary | ICD-10-CM | POA: Diagnosis not present

## 2021-02-21 DIAGNOSIS — Z79899 Other long term (current) drug therapy: Secondary | ICD-10-CM | POA: Diagnosis not present

## 2021-02-21 DIAGNOSIS — Z5112 Encounter for antineoplastic immunotherapy: Secondary | ICD-10-CM | POA: Diagnosis not present

## 2021-02-21 LAB — CBC WITH DIFFERENTIAL (CANCER CENTER ONLY)
Abs Immature Granulocytes: 0.01 10*3/uL (ref 0.00–0.07)
Basophils Absolute: 0 10*3/uL (ref 0.0–0.1)
Basophils Relative: 1 %
Eosinophils Absolute: 0.8 10*3/uL — ABNORMAL HIGH (ref 0.0–0.5)
Eosinophils Relative: 13 %
HCT: 31.9 % — ABNORMAL LOW (ref 36.0–46.0)
Hemoglobin: 10.9 g/dL — ABNORMAL LOW (ref 12.0–15.0)
Immature Granulocytes: 0 %
Lymphocytes Relative: 21 %
Lymphs Abs: 1.3 10*3/uL (ref 0.7–4.0)
MCH: 30.3 pg (ref 26.0–34.0)
MCHC: 34.2 g/dL (ref 30.0–36.0)
MCV: 88.6 fL (ref 80.0–100.0)
Monocytes Absolute: 1.2 10*3/uL — ABNORMAL HIGH (ref 0.1–1.0)
Monocytes Relative: 20 %
Neutro Abs: 2.7 10*3/uL (ref 1.7–7.7)
Neutrophils Relative %: 45 %
Platelet Count: 198 10*3/uL (ref 150–400)
RBC: 3.6 MIL/uL — ABNORMAL LOW (ref 3.87–5.11)
RDW: 15.5 % (ref 11.5–15.5)
WBC Count: 6.1 10*3/uL (ref 4.0–10.5)
nRBC: 0 % (ref 0.0–0.2)

## 2021-02-21 LAB — CMP (CANCER CENTER ONLY)
ALT: 26 U/L (ref 0–44)
AST: 15 U/L (ref 15–41)
Albumin: 4.1 g/dL (ref 3.5–5.0)
Alkaline Phosphatase: 123 U/L (ref 38–126)
Anion gap: 11 (ref 5–15)
BUN: 20 mg/dL (ref 8–23)
CO2: 22 mmol/L (ref 22–32)
Calcium: 9.3 mg/dL (ref 8.9–10.3)
Chloride: 105 mmol/L (ref 98–111)
Creatinine: 0.74 mg/dL (ref 0.44–1.00)
GFR, Estimated: >60 mL/min (ref >60)
Glucose, Bld: 86 mg/dL (ref 70–99)
Potassium: 4.1 mmol/L (ref 3.5–5.1)
Sodium: 138 mmol/L (ref 135–145)
Total Bilirubin: 0.5 mg/dL (ref 0.3–1.2)
Total Protein: 6.7 g/dL (ref 6.5–8.1)

## 2021-02-21 LAB — LACTATE DEHYDROGENASE: LDH: 164 U/L (ref 98–192)

## 2021-02-21 MED ORDER — BORTEZOMIB CHEMO SQ INJECTION 3.5 MG (2.5MG/ML)
1.3000 mg/m2 | Freq: Once | INTRAMUSCULAR | Status: AC
  Administered 2021-02-21: 2.25 mg via SUBCUTANEOUS
  Filled 2021-02-21: qty 0.9

## 2021-02-21 MED ORDER — DEXAMETHASONE 4 MG PO TABS: 40.0000 mg | ORAL_TABLET | Freq: Once | ORAL | Status: DC

## 2021-02-21 NOTE — Patient Instructions (Addendum)
[facility] Discharge Instructions for Patients Receiving Chemotherapy  Today you received the following chemotherapy agents: Bortezomib.  To help prevent nausea and vomiting after your treatment, we encourage you to take your nausea medication as directed.   If you develop nausea and vomiting that is not controlled by your nausea medication, call the clinic.   BELOW ARE SYMPTOMS THAT SHOULD BE REPORTED IMMEDIATELY:  *FEVER GREATER THAN 100.5 F  *CHILLS WITH OR WITHOUT FEVER  NAUSEA AND VOMITING THAT IS NOT CONTROLLED WITH YOUR NAUSEA MEDICATION  *UNUSUAL SHORTNESS OF BREATH  *UNUSUAL BRUISING OR BLEEDING  TENDERNESS IN MOUTH AND THROAT WITH OR WITHOUT PRESENCE OF ULCERS  *URINARY PROBLEMS  *BOWEL PROBLEMS  UNUSUAL RASH Items with * indicate a potential emergency and should be followed up as soon as possible.  Feel free to call the clinic should you have any questions or concerns. The clinic phone number is [phone].  Please show the [place] at check-in to the Emergency Department and triage nurse.

## 2021-02-22 LAB — KAPPA/LAMBDA LIGHT CHAINS
Kappa free light chain: 15.3 mg/L (ref 3.3–19.4)
Kappa, lambda light chain ratio: 0.03 — ABNORMAL LOW (ref 0.26–1.65)
Lambda free light chains: 543.8 mg/L — ABNORMAL HIGH (ref 5.7–26.3)

## 2021-02-24 ENCOUNTER — Encounter: Payer: Self-pay | Admitting: Hematology and Oncology

## 2021-02-24 NOTE — Progress Notes (Addendum)
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 Concerning for multiple myeloma vs metastatic disease 12/31/2020: establish care with Dr. Lorenso Courier. UPEP showed marked Bence Jones protein, findings concerning for multiple myleoma 01/21/2021: bone marrow biopsy confirms multiple myeloma with atypical plasma cells representing 28% of all cells in the aspirate  02/01/2021: Cycle 1 Day 1 of VRd chemotherapy 02/22/2021: Cycle 2 Day 1 of VRd chemotherapy  Interval History:  Doris Wilkerson 74 y.o. female with medical history significant for lambda light chain multiple myeloma who presents for a follow up visit. The patient's last visit was on 01/25/2021. In the interim since the last visit she has started on VRd chemotherapy.   On exam today Doris Wilkerson is accompanied by her husband. She reports that she tolerated the first doses of chemotherapy well without major complication. She did have an episode of dizziness that resulted in a fall and has been more dependant on her rollator walker since she started. She has had no nausea/ vomiting/ or diarrhea. She reports she is eating 5 meals per day and taking metamucil to help her bowels move. She is still working on dental clearance with her dentist. She has some sensitive of the mouth without frank sores or bleeding.  She states that otherwise her health has been stable compared to her last visit last month.  She denies any fevers, chills, sweats, nausea, vomiting or diarrhea. A full 10 point ROS is listed below.   MEDICAL HISTORY:  Past Medical History:  Diagnosis Date   . Asthma   . Cataract   . GERD (gastroesophageal reflux disease)   . Hyperlipidemia   . Hypertension   . Thyroid disease     SURGICAL HISTORY: Past Surgical History:  Procedure Laterality Date  . Cataract Surgery    . CORONARY ANGIOPLASTY WITH STENT PLACEMENT    . EYE SURGERY    . ORTHOPEDIC SURGERY  2012  . TONSILLECTOMY  1951    SOCIAL HISTORY: Social History   Socioeconomic History  . Marital status: Married    Spouse name: Not on file  . Number of children: 0  . Years of education: Not on file  . Highest education level: Not on file  Occupational History  . Occupation: unemployed  Tobacco Use  . Smoking status: Never Smoker  . Smokeless tobacco: Never Used  Vaping Use  . Vaping Use: Never used  Substance and Sexual Activity  . Alcohol use: Yes    Alcohol/week: 1.0 standard drink    Types: 1 Glasses of wine per week    Comment: occassionally  . Drug use: No  . Sexual activity: Not Currently  Other Topics Concern  . Not on file  Social History Narrative   Married. Education: college. Pt does exercise-   Social Determinants of Health   Financial Resource Strain: Not on file  Food Insecurity: Not on file  Transportation Needs: Not on file  Physical Activity: Not on file  Stress: Not on file  Social Connections: Not on file  Intimate Partner Violence: Not on file    FAMILY HISTORY: Family History  Problem Relation Age of Onset  . Cancer Father   .  Heart disease Brother   . Dementia Brother   . Leukemia Brother   . Breast cancer Neg Hx   . Colon cancer Neg Hx   . Esophageal cancer Neg Hx   . Pancreatic cancer Neg Hx   . Rectal cancer Neg Hx   . Stomach cancer Neg Hx     ALLERGIES:  is allergic to poison ivy extract, plavix [clopidogrel bisulfate], and other.  MEDICATIONS:  Current Outpatient Medications  Medication Sig Dispense Refill  . acetaminophen (TYLENOL 8 HOUR ARTHRITIS PAIN) 650 MG CR tablet Take 1 tablet (650 mg total) by mouth  every 8 (eight) hours as needed for pain.    Marland Kitchen acetaminophen (TYLENOL) 500 MG tablet Take 1 tablet (500 mg total) by mouth every 8 (eight) hours as needed. 30 tablet 0  . acyclovir (ZOVIRAX) 400 MG tablet Take 1 tablet (400 mg total) by mouth 2 (two) times daily. 60 tablet 5  . aspirin 81 MG tablet Take 81 mg by mouth daily.     Marland Kitchen atorvastatin (LIPITOR) 80 MG tablet Take 1 tablet (80 mg total) by mouth daily at 6 PM. 90 tablet 3  . benazepril (LOTENSIN) 10 MG tablet TAKE 1 TABLET(10 MG) BY MOUTH DAILY 90 tablet 3  . calcium carbonate (OS-CAL) 1250 (500 Ca) MG chewable tablet Chew 1 tablet by mouth daily.    Marland Kitchen dexamethasone (DECADRON) 4 MG tablet Take 10 tablets (40 mg total) by mouth once a week. 40 tablet 3  . DM-APAP-CPM (CORICIDIN HBP PO) Take by mouth.     . lenalidomide (REVLIMID) 25 MG capsule Take 1 capsule (25 mg total) by mouth daily. Take for 14 days; then none for 7 days. Repeat every 21 days [employer] Auth # [id number]    Date Obtained 02/15/21 14 capsule 0  . levothyroxine (SYNTHROID) 25 MCG tablet TAKE 1 TABLET(25 MCG) BY MOUTH DAILY BEFORE BREAKFAST (Patient taking differently: 630 mcg. TAKE 1 TABLET(25 MCG) BY MOUTH DAILY BEFORE BREAKFAST) 90 tablet 3  . meclizine (ANTIVERT) 25 MG tablet Take 1 tablet (25 mg total) by mouth 3 (three) times daily as needed for dizziness. 30 tablet 0  . NON FORMULARY 1,000 mg. CBD oil    . ondansetron (ZOFRAN) 8 MG tablet Take 1 tablet (8 mg total) by mouth every 8 (eight) hours as needed for nausea or vomiting. 30 tablet 0  . OVER THE COUNTER MEDICATION Vitamin D 2000iu daily.     . prochlorperazine (COMPAZINE) 10 MG tablet Take 1 tablet (10 mg total) by mouth every 6 (six) hours as needed for nausea or vomiting. 30 tablet 0  . vitamin B-12 (CYANOCOBALAMIN) 250 MCG tablet Take 250 mcg by mouth daily.     No current facility-administered medications for this visit.    REVIEW OF SYSTEMS:   Constitutional: ( - ) fevers, ( - )  chills , ( - ) night  sweats Eyes: ( - ) blurriness of vision, ( - ) double vision, ( - ) watery eyes Ears, nose, mouth, throat, and face: ( - ) mucositis, ( - ) sore throat Respiratory: ( - ) cough, ( - ) dyspnea, ( - ) wheezes Cardiovascular: ( - ) palpitation, ( - ) chest discomfort, ( - ) lower extremity swelling Gastrointestinal:  ( - ) nausea, ( - ) heartburn, ( - ) change in bowel habits Skin: ( - ) abnormal skin rashes Lymphatics: ( - ) new lymphadenopathy, ( - ) easy bruising Neurological: ( - ) numbness, ( - )  tingling, ( - ) new weaknesses Behavioral/Psych: ( - ) mood change, ( - ) new changes  All other systems were reviewed with the patient and are negative.  PHYSICAL EXAMINATION: ECOG PERFORMANCE STATUS: 1 - Symptomatic but completely ambulatory  Vitals:   02/21/21 0821  BP: 129/71  Pulse: 76  Resp: 18  Temp: 98 F (36.7 C)  SpO2: 100%   Filed Weights   02/21/21 0821  Weight: 153 lb 12.8 oz (69.8 kg)    GENERAL: well appearing elderly Caucasian female in NAD  SKIN: skin color, texture, turgor are normal, no rashes or significant lesions EYES: conjunctiva are pink and non-injected, sclera clear LUNGS: clear to auscultation and percussion with normal breathing effort HEART: regular rate & rhythm and no murmurs and no lower extremity edema Musculoskeletal: no cyanosis of digits and no clubbing  PSYCH: alert & oriented x 3, fluent speech NEURO: no focal motor/sensory deficits  LABORATORY DATA:  I have reviewed the data as listed CBC Latest Ref Rng & Units 02/21/2021 02/15/2021 02/08/2021  WBC 4.0 - 10.5 K/uL 6.1 7.2 9.1  Hemoglobin 12.0 - 15.0 g/dL 10.9(L) 10.4(L) 10.4(L)  Hematocrit 36.0 - 46.0 % 31.9(L) 32.0(L) 31.1(L)  Platelets 150 - 400 K/uL 198 197 219    CMP Latest Ref Rng & Units 02/21/2021 02/15/2021 02/08/2021  Glucose 70 - 99 mg/dL 86 104(H) 94  BUN 8 - 23 mg/dL '20 13 12  ' Creatinine 0.44 - 1.00 mg/dL 0.74 0.70 0.76  Sodium 135 - 145 mmol/L 138 136 137  Potassium 3.5 - 5.1  mmol/L 4.1 4.1 4.0  Chloride 98 - 111 mmol/L 105 103 104  CO2 22 - 32 mmol/L '22 23 23  ' Calcium 8.9 - 10.3 mg/dL 9.3 9.2 9.4  Total Protein 6.5 - 8.1 g/dL 6.7 6.6 6.9  Total Bilirubin 0.3 - 1.2 mg/dL 0.5 0.6 0.6  Alkaline Phos 38 - 126 U/L 123 105 88  AST 15 - 41 U/L '15 17 19  ' ALT 0 - 44 U/L '26 24 23    ' Lab Results  Component Value Date   MPROTEIN Not Observed 02/15/2021   MPROTEIN Not Observed 02/08/2021   MPROTEIN Not Observed 12/31/2020   Lab Results  Component Value Date   KPAFRELGTCHN 15.3 02/21/2021   KPAFRELGTCHN 17.3 02/15/2021   KPAFRELGTCHN 12.5 02/08/2021   LAMBDASER 543.8 (H) 02/21/2021   LAMBDASER 632.7 (H) 02/15/2021   LAMBDASER 1,933.7 (H) 02/08/2021   KAPLAMBRATIO 0.03 (L) 02/21/2021   KAPLAMBRATIO 0.03 (L) 02/15/2021   KAPLAMBRATIO 0.01 (L) 02/08/2021    RADIOGRAPHIC STUDIES: No results found.  ASSESSMENT & PLAN Doris Wilkerson 75 y.o. female with medical history significant for lambda light chain multiple myeloma who presents for a follow up visit.   After review the labs, review the records, schedule the patient the findings most consistent with a lambda light chain multiple myeloma.  This is getting confirmed with a bone marrow biopsy showing an abnormal plasma cell population of 28% and M protein/Bence-Jones proteins in the urine.  Given these findings the patient now carries a diagnosis of lambda light chain multiple myeloma.  R-ISS: Stage II (high risk genetics)  We will proceed with VRD chemotherapy.  This consists of bortezomib 1.3 mg per metered squared q week, lenalidomide 25 mg p.o. x14 days of 21-day cycle, and dexamethasone 40 mg p.o. q. Weekly.  This will be continued until he reached a VG PR at which time we can consider transition over to maintenance Revlimid 5 mg p.o. daily.  Ms. Wilkerson is not a candidate for bone marrow transplant based on her advanced age.  # Lambda Light Chain Multiple Myeloma --diagnosis confirmed with bone marrow biopsy  and urine protein analysis.   --patient has good functional status and would be an excellent candidate for VRd chemotherapy. I do not believe she would be a candidate.  --assure monthly restaging labs with SPEP, UPEP, and SFLC --patient declines port placement at this time --Cycle 1 Day 1 of therapy started on 02/01/2021.  --today is Cycle 2 Day 1 --RTC in 2 weeks with continued weekly therapy  #Supportive Care --chemotherapy education to be scheduled  --patient declines port placement at this time --zofran 15m q8H PRN and compazine 121mPO q6H for nausea --acyclovir 40067mO BID for VCZ prophylaxis --zometa therapy to be scheduled following dental clearance -- no pain medication required at this time.   No orders of the defined types were placed in this encounter.  All questions were answered. The patient knows to call the clinic with any problems, questions or concerns.  A total of more than 30 minutes were spent on this encounter and over half of that time was spent on counseling and coordination of care as outlined above.   JohLedell PeoplesD Department of Hematology/Oncology [facility] Center[facility]: [phone]: [phone]: [email or web address]  02/24/2021 3:08 PM   Literature Support:  SuzBennett Scrapertezomib, lenalidomide, and dexamethasone in transplant-eligible newly diagnosed multiple myeloma patients: a multicenter retrospective comparative analysis. Int J HHinton Dyer020 Jan;111(1):103-111. doi: 10.1007/s12185-019-02764-1.  -- Overall response, very good partial response, and complete response rates after VRD were 96.4%, 45.5%, and 20.0%, respectively (median follow-up period, 17.7 months). The 1-year progression-free survival (PFS) and overall survival rates were 95.8% and 98.2%, respectively. The response  rate and PFS were similar between the groups, regardless of cytogenetic risk and age.

## 2021-02-25 ENCOUNTER — Other Ambulatory Visit: Payer: Self-pay | Admitting: *Deleted

## 2021-02-25 DIAGNOSIS — C9000 Multiple myeloma not having achieved remission: Secondary | ICD-10-CM

## 2021-02-26 ENCOUNTER — Encounter: Payer: Self-pay | Admitting: Hematology and Oncology

## 2021-02-27 ENCOUNTER — Encounter: Payer: Self-pay | Admitting: Hematology and Oncology

## 2021-02-28 ENCOUNTER — Encounter: Payer: Self-pay | Admitting: *Deleted

## 2021-02-28 LAB — UPEP/UIFE/LIGHT CHAINS/TP, 24-HR UR
% BETA, Urine: 0 %
ALPHA 1 URINE: 0 %
Albumin, U: 0 %
Alpha 2, Urine: 0 %
Free Kappa Lt Chains,Ur: 3.98 mg/L (ref 1.17–86.46)
Free Kappa/Lambda Ratio: 3.24 (ref 1.83–14.26)
Free Lambda Lt Chains,Ur: 1.23 mg/L (ref 0.27–15.21)
GAMMA GLOBULIN URINE: 0 %
Total Protein, Urine-Ur/day: <124 mg/24 hr (ref 30–150)
Total Protein, Urine: <4 mg/dL

## 2021-03-01 ENCOUNTER — Other Ambulatory Visit: Payer: Self-pay

## 2021-03-01 ENCOUNTER — Inpatient Hospital Stay: Payer: Medicare Other

## 2021-03-01 ENCOUNTER — Inpatient Hospital Stay: Payer: Medicare Other | Attending: Hematology and Oncology

## 2021-03-01 VITALS — BP 110/62 | HR 68 | Temp 98.2°F | Resp 17 | Wt 154.0 lb

## 2021-03-01 DIAGNOSIS — Z5112 Encounter for antineoplastic immunotherapy: Secondary | ICD-10-CM | POA: Diagnosis not present

## 2021-03-01 DIAGNOSIS — C9000 Multiple myeloma not having achieved remission: Secondary | ICD-10-CM

## 2021-03-01 LAB — CBC WITH DIFFERENTIAL (CANCER CENTER ONLY)
Abs Immature Granulocytes: 0.06 10*3/uL (ref 0.00–0.07)
Basophils Absolute: 0.1 10*3/uL (ref 0.0–0.1)
Basophils Relative: 1 %
Eosinophils Absolute: 0.8 10*3/uL — ABNORMAL HIGH (ref 0.0–0.5)
Eosinophils Relative: 11 %
HCT: 34.2 % — ABNORMAL LOW (ref 36.0–46.0)
Hemoglobin: 11.1 g/dL — ABNORMAL LOW (ref 12.0–15.0)
Immature Granulocytes: 1 %
Lymphocytes Relative: 8 %
Lymphs Abs: 0.6 10*3/uL — ABNORMAL LOW (ref 0.7–4.0)
MCH: 30.2 pg (ref 26.0–34.0)
MCHC: 32.5 g/dL (ref 30.0–36.0)
MCV: 92.9 fL (ref 80.0–100.0)
Monocytes Absolute: 0.4 10*3/uL (ref 0.1–1.0)
Monocytes Relative: 6 %
Neutro Abs: 5.6 10*3/uL (ref 1.7–7.7)
Neutrophils Relative %: 73 %
Platelet Count: 250 10*3/uL (ref 150–400)
RBC: 3.68 MIL/uL — ABNORMAL LOW (ref 3.87–5.11)
RDW: 16.5 % — ABNORMAL HIGH (ref 11.5–15.5)
WBC Count: 7.6 10*3/uL (ref 4.0–10.5)
nRBC: 0 % (ref 0.0–0.2)

## 2021-03-01 LAB — CMP (CANCER CENTER ONLY)
ALT: 23 U/L (ref 0–44)
AST: 17 U/L (ref 15–41)
Albumin: 4.1 g/dL (ref 3.5–5.0)
Alkaline Phosphatase: 128 U/L — ABNORMAL HIGH (ref 38–126)
Anion gap: 11 (ref 5–15)
BUN: 13 mg/dL (ref 8–23)
CO2: 24 mmol/L (ref 22–32)
Calcium: 9.2 mg/dL (ref 8.9–10.3)
Chloride: 104 mmol/L (ref 98–111)
Creatinine: 0.8 mg/dL (ref 0.44–1.00)
GFR, Estimated: >60 mL/min (ref >60)
Glucose, Bld: 88 mg/dL (ref 70–99)
Potassium: 4.4 mmol/L (ref 3.5–5.1)
Sodium: 139 mmol/L (ref 135–145)
Total Bilirubin: 0.7 mg/dL (ref 0.3–1.2)
Total Protein: 6.5 g/dL (ref 6.5–8.1)

## 2021-03-01 LAB — LACTATE DEHYDROGENASE: LDH: 180 U/L (ref 98–192)

## 2021-03-01 MED ORDER — DEXAMETHASONE 4 MG PO TABS: 40.0000 mg | ORAL_TABLET | Freq: Once | ORAL | Status: DC

## 2021-03-01 MED ORDER — BORTEZOMIB CHEMO SQ INJECTION 3.5 MG (2.5MG/ML)
1.3000 mg/m2 | Freq: Once | INTRAMUSCULAR | Status: AC
Start: 2021-03-01 — End: 2021-03-01
  Administered 2021-03-01: 2.25 mg via SUBCUTANEOUS
  Filled 2021-03-01: qty 0.9

## 2021-03-01 NOTE — Patient Instructions (Signed)
Doris Wilkerson Discharge Instructions for Patients Receiving Chemotherapy  Today you received the following chemotherapy agents: Bortezomib.  To help prevent nausea and vomiting after your treatment, we encourage you to take your nausea medication as directed.   If you develop nausea and vomiting that is not controlled by your nausea medication, call the clinic.   BELOW ARE SYMPTOMS THAT SHOULD BE REPORTED IMMEDIATELY:  *FEVER GREATER THAN 100.5 F  *CHILLS WITH OR WITHOUT FEVER  NAUSEA AND VOMITING THAT IS NOT CONTROLLED WITH YOUR NAUSEA MEDICATION  *UNUSUAL SHORTNESS OF BREATH  *UNUSUAL BRUISING OR BLEEDING  TENDERNESS IN MOUTH AND THROAT WITH OR WITHOUT PRESENCE OF ULCERS  *URINARY PROBLEMS  *BOWEL PROBLEMS  UNUSUAL RASH Items with * indicate a potential emergency and should be followed up as soon as possible.  Feel free to call the clinic should you have any questions or concerns. The clinic phone number is [phone].  Please show the [place] at check-in to the Emergency Department and triage nurse.

## 2021-03-04 LAB — KAPPA/LAMBDA LIGHT CHAINS
Kappa free light chain: 16.3 mg/L (ref 3.3–19.4)
Kappa, lambda light chain ratio: 0.04 — ABNORMAL LOW (ref 0.26–1.65)
Lambda free light chains: 409.8 mg/L — ABNORMAL HIGH (ref 5.7–26.3)

## 2021-03-06 ENCOUNTER — Other Ambulatory Visit: Payer: Self-pay | Admitting: *Deleted

## 2021-03-06 DIAGNOSIS — C9000 Multiple myeloma not having achieved remission: Secondary | ICD-10-CM

## 2021-03-06 MED ORDER — LENALIDOMIDE 25 MG PO CAPS: 25.0000 mg | ORAL_CAPSULE | Freq: Every day | ORAL | 0 refills | Status: DC

## 2021-03-08 ENCOUNTER — Inpatient Hospital Stay (HOSPITAL_BASED_OUTPATIENT_CLINIC_OR_DEPARTMENT_OTHER): Payer: Medicare Other | Admitting: Hematology and Oncology

## 2021-03-08 ENCOUNTER — Inpatient Hospital Stay: Payer: Medicare Other

## 2021-03-08 ENCOUNTER — Other Ambulatory Visit: Payer: Self-pay

## 2021-03-08 VITALS — BP 136/72 | HR 82 | Temp 98.4°F | Resp 18 | Ht 66.0 in | Wt 152.9 lb

## 2021-03-08 DIAGNOSIS — C9000 Multiple myeloma not having achieved remission: Secondary | ICD-10-CM

## 2021-03-08 DIAGNOSIS — M899 Disorder of bone, unspecified: Secondary | ICD-10-CM | POA: Diagnosis not present

## 2021-03-08 DIAGNOSIS — Z5112 Encounter for antineoplastic immunotherapy: Secondary | ICD-10-CM | POA: Diagnosis not present

## 2021-03-08 DIAGNOSIS — E039 Hypothyroidism, unspecified: Secondary | ICD-10-CM | POA: Diagnosis not present

## 2021-03-08 LAB — CBC WITH DIFFERENTIAL (CANCER CENTER ONLY)
Abs Immature Granulocytes: 0.07 10*3/uL (ref 0.00–0.07)
Basophils Absolute: 0.1 10*3/uL (ref 0.0–0.1)
Basophils Relative: 1 %
Eosinophils Absolute: 0.6 10*3/uL — ABNORMAL HIGH (ref 0.0–0.5)
Eosinophils Relative: 7 %
HCT: 35.8 % — ABNORMAL LOW (ref 36.0–46.0)
Hemoglobin: 11.5 g/dL — ABNORMAL LOW (ref 12.0–15.0)
Immature Granulocytes: 1 %
Lymphocytes Relative: 10 %
Lymphs Abs: 0.9 10*3/uL (ref 0.7–4.0)
MCH: 29.9 pg (ref 26.0–34.0)
MCHC: 32.1 g/dL (ref 30.0–36.0)
MCV: 93 fL (ref 80.0–100.0)
Monocytes Absolute: 0.6 10*3/uL (ref 0.1–1.0)
Monocytes Relative: 7 %
Neutro Abs: 6.8 10*3/uL (ref 1.7–7.7)
Neutrophils Relative %: 74 %
Platelet Count: 271 10*3/uL (ref 150–400)
RBC: 3.85 MIL/uL — ABNORMAL LOW (ref 3.87–5.11)
RDW: 16.3 % — ABNORMAL HIGH (ref 11.5–15.5)
WBC Count: 9 10*3/uL (ref 4.0–10.5)
nRBC: 0 % (ref 0.0–0.2)

## 2021-03-08 LAB — CMP (CANCER CENTER ONLY)
ALT: 22 U/L (ref 0–44)
AST: 19 U/L (ref 15–41)
Albumin: 4.3 g/dL (ref 3.5–5.0)
Alkaline Phosphatase: 125 U/L (ref 38–126)
Anion gap: 12 (ref 5–15)
BUN: 14 mg/dL (ref 8–23)
CO2: 24 mmol/L (ref 22–32)
Calcium: 9.5 mg/dL (ref 8.9–10.3)
Chloride: 103 mmol/L (ref 98–111)
Creatinine: 0.77 mg/dL (ref 0.44–1.00)
GFR, Estimated: >60 mL/min (ref >60)
Glucose, Bld: 96 mg/dL (ref 70–99)
Potassium: 3.9 mmol/L (ref 3.5–5.1)
Sodium: 139 mmol/L (ref 135–145)
Total Bilirubin: 0.7 mg/dL (ref 0.3–1.2)
Total Protein: 7 g/dL (ref 6.5–8.1)

## 2021-03-08 LAB — LACTATE DEHYDROGENASE: LDH: 189 U/L (ref 98–192)

## 2021-03-08 MED ORDER — DEXAMETHASONE 4 MG PO TABS: 40.0000 mg | ORAL_TABLET | Freq: Once | ORAL | Status: DC

## 2021-03-08 MED ORDER — BORTEZOMIB CHEMO SQ INJECTION 3.5 MG (2.5MG/ML)
1.3000 mg/m2 | Freq: Once | INTRAMUSCULAR | Status: AC
  Administered 2021-03-08: 2.25 mg via SUBCUTANEOUS
  Filled 2021-03-08: qty 0.9

## 2021-03-08 NOTE — Patient Instructions (Signed)
[facility] Discharge Instructions for Patients Receiving Chemotherapy  Today you received the following chemotherapy agents: bortezomib.  To help prevent nausea and vomiting after your treatment, we encourage you to take your nausea medication as directed.   If you develop nausea and vomiting that is not controlled by your nausea medication, call the clinic.   BELOW ARE SYMPTOMS THAT SHOULD BE REPORTED IMMEDIATELY:  *FEVER GREATER THAN 100.5 F  *CHILLS WITH OR WITHOUT FEVER  NAUSEA AND VOMITING THAT IS NOT CONTROLLED WITH YOUR NAUSEA MEDICATION  *UNUSUAL SHORTNESS OF BREATH  *UNUSUAL BRUISING OR BLEEDING  TENDERNESS IN MOUTH AND THROAT WITH OR WITHOUT PRESENCE OF ULCERS  *URINARY PROBLEMS  *BOWEL PROBLEMS  UNUSUAL RASH Items with * indicate a potential emergency and should be followed up as soon as possible.  Feel free to call the clinic should you have any questions or concerns. The clinic phone number is [phone].  Please show the CHEMO ALERT CARD at check-in to the Emergency Department and triage nurse.   

## 2021-03-11 ENCOUNTER — Telehealth (INDEPENDENT_AMBULATORY_CARE_PROVIDER_SITE_OTHER): Payer: Medicare Other | Admitting: Sports Medicine

## 2021-03-11 VITALS — Ht 66.0 in

## 2021-03-11 DIAGNOSIS — C9000 Multiple myeloma not having achieved remission: Secondary | ICD-10-CM | POA: Diagnosis not present

## 2021-03-11 DIAGNOSIS — I2510 Atherosclerotic heart disease of native coronary artery without angina pectoris: Secondary | ICD-10-CM | POA: Diagnosis not present

## 2021-03-11 LAB — MULTIPLE MYELOMA PANEL, SERUM
Albumin SerPl Elph-Mcnc: 4 g/dL (ref 2.9–4.4)
Albumin/Glob SerPl: 1.7 (ref 0.7–1.7)
Alpha 1: 0.2 g/dL (ref 0.0–0.4)
Alpha2 Glob SerPl Elph-Mcnc: 0.8 g/dL (ref 0.4–1.0)
B-Globulin SerPl Elph-Mcnc: 0.9 g/dL (ref 0.7–1.3)
Gamma Glob SerPl Elph-Mcnc: 0.5 g/dL (ref 0.4–1.8)
Globulin, Total: 2.5 g/dL (ref 2.2–3.9)
IgA: 76 mg/dL (ref 64–422)
IgG (Immunoglobin G), Serum: 492 mg/dL — ABNORMAL LOW (ref 586–1602)
IgM (Immunoglobulin M), Srm: 40 mg/dL (ref 26–217)
M Protein SerPl Elph-Mcnc: 0.1 g/dL — ABNORMAL HIGH
Total Protein ELP: 6.5 g/dL (ref 6.0–8.5)

## 2021-03-11 NOTE — Progress Notes (Addendum)
Patient ID: Doris Wilkerson, female   DOB: 06/19/1946, 75 y.o.   MRN: [id number]  I spoke with Doris Wilkerson on the phone today after an attempted video visit was unsuccessful.  She was at her home and I was located in my office.  Consent was obtained to proceed with the visit.  She is currently undergoing treatment for multiple myeloma.  Her treatment is going very well.  She is feeling really good and is anxious to become more physically active.  She is asking my advice about the best way to approach this.  I have recommended that she start with formal physical therapy.  An order was previously placed by her oncologist but it sounds like she was still having quite a bit of pain at that time.  Now that her pain has improved, I think it is reasonable for her to be reconsidered.  I think a note from her oncologist will help as well.  Under the guidance of physical therapy, she should be able to safely start regaining some stamina and improving her strength.  The hope is that she will eventually transition to a home exercise program and can start to eventually incorporate more traditional exercise into her routine.  She also tells me that she has been doing some walking and gardening which I think are fantastic.  I will remain available to see her as needed.  Addendum: Total time spent with this patient was 15 minutes

## 2021-03-12 ENCOUNTER — Encounter: Payer: Self-pay | Admitting: Hematology and Oncology

## 2021-03-12 NOTE — Progress Notes (Signed)
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 Concerning for multiple myeloma vs metastatic disease 12/31/2020: establish care with Dr. Lorenso Courier. UPEP showed marked Bence Jones protein, findings concerning for multiple myleoma 01/21/2021: bone marrow biopsy confirms multiple myeloma with atypical plasma cells representing 28% of all cells in the aspirate  02/01/2021: Cycle 1 Day 1 of VRd chemotherapy 02/22/2021: Cycle 2 Day 1 of VRd chemotherapy 03/15/2021: planned Cycle 3 Day 1 of VRd chemotherapy  Interval History:  Doris Wilkerson 75 y.o. female with medical history significant for lambda light chain multiple myeloma who presents for a follow up visit. The patient's last visit was on 02/21/2021. In the interim since the last visit she continued on VRd chemotherapy.   On exam today Doris Wilkerson is accompanied by her husband. She reports that she has tolerated chemotherapy well without major side effects.  She does note some continued pain in her left hip, but overall it is improved and she is able to tolerate exercises such as "sit and be fit".  She reports that she is mobile at home with her Rollator walker.  She is currently considering Zometa therapy and is discussing this with her dentist.  Otherwise she has tolerated therapy well without any overt neuropathy, stomach upset, or cytopenias.  She denies any fevers, chills, sweats, nausea, vomiting or diarrhea. A full 10 point ROS is listed below.   MEDICAL HISTORY:  Past Medical History:  Diagnosis Date  . Asthma   . Cataract   . GERD  (gastroesophageal reflux disease)   . Hyperlipidemia   . Hypertension   . Thyroid disease     SURGICAL HISTORY: Past Surgical History:  Procedure Laterality Date  . Cataract Surgery    . CORONARY ANGIOPLASTY WITH STENT PLACEMENT    . EYE SURGERY    . ORTHOPEDIC SURGERY  2012  . TONSILLECTOMY  1951    SOCIAL HISTORY: Social History   Socioeconomic History  . Marital status: Married    Spouse name: Not on file  . Number of children: 0  . Years of education: Not on file  . Highest education level: Not on file  Occupational History  . Occupation: unemployed  Tobacco Use  . Smoking status: Never Smoker  . Smokeless tobacco: Never Used  Vaping Use  . Vaping Use: Never used  Substance and Sexual Activity  . Alcohol use: Yes    Alcohol/week: 1.0 standard drink    Types: 1 Glasses of wine per week    Comment: occassionally  . Drug use: No  . Sexual activity: Not Currently  Other Topics Concern  . Not on file  Social History Narrative   Married. Education: college. Pt does exercise-   Social Determinants of Health   Financial Resource Strain: Not on file  Food Insecurity: Not on file  Transportation Needs: Not on file  Physical Activity: Not on file  Stress: Not on file  Social Connections: Not on file  Intimate Partner Violence: Not on file    FAMILY HISTORY: Family History  Problem Relation Age of Onset  . Cancer Father   . Heart disease Brother   . Dementia Brother   .  Leukemia Brother   . Breast cancer Neg Hx   . Colon cancer Neg Hx   . Esophageal cancer Neg Hx   . Pancreatic cancer Neg Hx   . Rectal cancer Neg Hx   . Stomach cancer Neg Hx     ALLERGIES:  is allergic to poison ivy extract, plavix [clopidogrel bisulfate], and other.  MEDICATIONS:  Current Outpatient Medications  Medication Sig Dispense Refill  . acetaminophen (TYLENOL 8 HOUR ARTHRITIS PAIN) 650 MG CR tablet Take 1 tablet (650 mg total) by mouth every 8 (eight) hours as needed for  pain.    Marland Kitchen acetaminophen (TYLENOL) 500 MG tablet Take 1 tablet (500 mg total) by mouth every 8 (eight) hours as needed. 30 tablet 0  . acyclovir (ZOVIRAX) 400 MG tablet Take 1 tablet (400 mg total) by mouth 2 (two) times daily. 60 tablet 5  . aspirin 81 MG tablet Take 81 mg by mouth daily.     Marland Kitchen atorvastatin (LIPITOR) 80 MG tablet Take 1 tablet (80 mg total) by mouth daily at 6 PM. 90 tablet 3  . benazepril (LOTENSIN) 10 MG tablet TAKE 1 TABLET(10 MG) BY MOUTH DAILY 90 tablet 3  . calcium carbonate (OS-CAL) 1250 (500 Ca) MG chewable tablet Chew 1 tablet by mouth daily.    Marland Kitchen dexamethasone (DECADRON) 4 MG tablet Take 10 tablets (40 mg total) by mouth once a week. 40 tablet 3  . DM-APAP-CPM (CORICIDIN HBP PO) Take by mouth.     . famotidine-calcium carbonate-magnesium hydroxide (PEPCID COMPLETE) 10-800-165 MG chewable tablet Chew 1 tablet by mouth daily.    Marland Kitchen lenalidomide (REVLIMID) 25 MG capsule Take 1 capsule (25 mg total) by mouth daily. Take for 14 days; then none for 7 days. Repeat every 21 days [employer] Auth # [id number]   Date Obtained 03/06/21 14 capsule 0  . levothyroxine (SYNTHROID) 25 MCG tablet TAKE 1 TABLET(25 MCG) BY MOUTH DAILY BEFORE BREAKFAST (Patient taking differently: 630 mcg. TAKE 1 TABLET(25 MCG) BY MOUTH DAILY BEFORE BREAKFAST) 90 tablet 3  . meclizine (ANTIVERT) 25 MG tablet Take 1 tablet (25 mg total) by mouth 3 (three) times daily as needed for dizziness. 30 tablet 0  . NON FORMULARY 1,000 mg. CBD oil    . ondansetron (ZOFRAN) 8 MG tablet Take 1 tablet (8 mg total) by mouth every 8 (eight) hours as needed for nausea or vomiting. 30 tablet 0  . OVER THE COUNTER MEDICATION Vitamin D 2000iu daily.     . prochlorperazine (COMPAZINE) 10 MG tablet Take 1 tablet (10 mg total) by mouth every 6 (six) hours as needed for nausea or vomiting. 30 tablet 0  . psyllium (METAMUCIL) 58.6 % packet Take 1 packet by mouth daily.    . vitamin B-12 (CYANOCOBALAMIN) 250 MCG tablet Take 250 mcg by mouth  daily.     No current facility-administered medications for this visit.    REVIEW OF SYSTEMS:   Constitutional: ( - ) fevers, ( - )  chills , ( - ) night sweats Eyes: ( - ) blurriness of vision, ( - ) double vision, ( - ) watery eyes Ears, nose, mouth, throat, and face: ( - ) mucositis, ( - ) sore throat Respiratory: ( - ) cough, ( - ) dyspnea, ( - ) wheezes Cardiovascular: ( - ) palpitation, ( - ) chest discomfort, ( - ) lower extremity swelling Gastrointestinal:  ( - ) nausea, ( - ) heartburn, ( - ) change in bowel habits Skin: ( - )  abnormal skin rashes Lymphatics: ( - ) new lymphadenopathy, ( - ) easy bruising Neurological: ( - ) numbness, ( - ) tingling, ( - ) new weaknesses Behavioral/Psych: ( - ) mood change, ( - ) new changes  All other systems were reviewed with the patient and are negative.  PHYSICAL EXAMINATION: ECOG PERFORMANCE STATUS: 1 - Symptomatic but completely ambulatory  Vitals:   03/08/21 1046  BP: 136/72  Pulse: 82  Resp: 18  Temp: 98.4 F (36.9 C)  SpO2: 100%   Filed Weights   03/08/21 1046  Weight: 152 lb 14.4 oz (69.4 kg)    GENERAL: well appearing elderly Caucasian female in NAD  SKIN: skin color, texture, turgor are normal, no rashes or significant lesions EYES: conjunctiva are pink and non-injected, sclera clear LUNGS: clear to auscultation and percussion with normal breathing effort HEART: regular rate & rhythm and no murmurs and no lower extremity edema Musculoskeletal: no cyanosis of digits and no clubbing  PSYCH: alert & oriented x 3, fluent speech NEURO: no focal motor/sensory deficits  LABORATORY DATA:  I have reviewed the data as listed CBC Latest Ref Rng & Units 03/08/2021 03/01/2021 02/21/2021  WBC 4.0 - 10.5 K/uL 9.0 7.6 6.1  Hemoglobin 12.0 - 15.0 g/dL 11.5(L) 11.1(L) 10.9(L)  Hematocrit 36.0 - 46.0 % 35.8(L) 34.2(L) 31.9(L)  Platelets 150 - 400 K/uL 271 250 198    CMP Latest Ref Rng & Units 03/08/2021 03/01/2021 02/21/2021  Glucose  70 - 99 mg/dL 96 88 86  BUN 8 - 23 mg/dL '14 13 20  ' Creatinine 0.44 - 1.00 mg/dL 0.77 0.80 0.74  Sodium 135 - 145 mmol/L 139 139 138  Potassium 3.5 - 5.1 mmol/L 3.9 4.4 4.1  Chloride 98 - 111 mmol/L 103 104 105  CO2 22 - 32 mmol/L '24 24 22  ' Calcium 8.9 - 10.3 mg/dL 9.5 9.2 9.3  Total Protein 6.5 - 8.1 g/dL 7.0 6.5 6.7  Total Bilirubin 0.3 - 1.2 mg/dL 0.7 0.7 0.5  Alkaline Phos 38 - 126 U/L 125 128(H) 123  AST 15 - 41 U/L '19 17 15  ' ALT 0 - 44 U/L '22 23 26    ' Lab Results  Component Value Date   MPROTEIN 0.1 (H) 03/08/2021   MPROTEIN Not Observed 02/15/2021   MPROTEIN Not Observed 02/08/2021   Lab Results  Component Value Date   KPAFRELGTCHN 16.3 03/01/2021   KPAFRELGTCHN 15.3 02/21/2021   KPAFRELGTCHN 17.3 02/15/2021   LAMBDASER 409.8 (H) 03/01/2021   LAMBDASER 543.8 (H) 02/21/2021   LAMBDASER 632.7 (H) 02/15/2021   KAPLAMBRATIO 0.04 (L) 03/01/2021   KAPLAMBRATIO 3.24 02/25/2021   KAPLAMBRATIO 0.03 (L) 02/21/2021    RADIOGRAPHIC STUDIES: No results found.  ASSESSMENT & PLAN Doris Wilkerson 75 y.o. female with medical history significant for lambda light chain multiple myeloma who presents for a follow up visit.   After review the labs, review the records, schedule the patient the findings most consistent with a lambda light chain multiple myeloma.  This is getting confirmed with a bone marrow biopsy showing an abnormal plasma cell population of 28% and M protein/Bence-Jones proteins in the urine.  Given these findings the patient now carries a diagnosis of lambda light chain multiple myeloma.  R-ISS: Stage II (high risk genetics)  We will proceed with VRD chemotherapy.  This consists of bortezomib 1.3 mg per metered squared q week, lenalidomide 25 mg p.o. x14 days of 21-day cycle, and dexamethasone 40 mg p.o. q. Weekly.  This will be continued  until he reached a VG PR at which time we can consider transition over to maintenance Revlimid 5 mg p.o. daily.  Doris Wilkerson is not a  candidate for bone marrow transplant based on her advanced age.  # Lambda Light Chain Multiple Myeloma --diagnosis confirmed with bone marrow biopsy and urine protein analysis.   --patient has good functional status and would be an excellent candidate for VRd chemotherapy. I do not believe she would be a bone marrow transplant candidate based on her age.  --assure monthly restaging labs with SPEP, UPEP, and SFLC --patient declines port placement at this time --Cycle 1 Day 1 of therapy started on 02/01/2021.  --today is Cycle 2 Day 15 --excellent response noted within 1st cycle, normalization of urine free light chains and undetectable M protein.  --RTC in 2 weeks with continued weekly therapy  #Supportive Care --chemotherapy education to be scheduled  --patient declines port placement at this time --zofran 32m q8H PRN and compazine 128mPO q6H for nausea --acyclovir 40013mO BID for VCZ prophylaxis --zometa therapy to be scheduled following dental clearance -- no pain medication required at this time.   No orders of the defined types were placed in this encounter.  All questions were answered. The patient knows to call the clinic with any problems, questions or concerns.  A total of more than 30 minutes were spent on this encounter and over half of that time was spent on counseling and coordination of care as outlined above.   JohLedell PeoplesD Department of Hematology/Oncology [facility] Centerone: [phone]: [phone]: [email or web address]  03/12/2021 10:36 AM   Literature Support:  SuzBennett Scrapertezomib, lenalidomide, and dexamethasone in transplant-eligible newly diagnosed multiple myeloma patients: a multicenter retrospective comparative analysis. Int J HHinton Dyer020 Jan;111(1):103-111. doi:  10.1007/s12185-019-02764-1.  -- Overall response, very good partial response, and complete response rates after VRD were 96.4%, 45.5%, and 20.0%, respectively (median follow-up period, 17.7 months). The 1-year progression-free survival (PFS) and overall survival rates were 95.8% and 98.2%, respectively. The response rate and PFS were similar between the groups, regardless of cytogenetic risk and age.

## 2021-03-14 ENCOUNTER — Telehealth: Payer: Self-pay | Admitting: Hematology and Oncology

## 2021-03-14 NOTE — Telephone Encounter (Signed)
Scheduled appt per 4/20 sch msg. MD will see pt in infusion per RN Beth.

## 2021-03-15 ENCOUNTER — Inpatient Hospital Stay: Payer: Medicare Other

## 2021-03-15 ENCOUNTER — Other Ambulatory Visit: Payer: Self-pay

## 2021-03-15 ENCOUNTER — Other Ambulatory Visit: Payer: Self-pay | Admitting: Hematology and Oncology

## 2021-03-15 VITALS — BP 123/77 | HR 97 | Temp 98.5°F | Resp 16 | Wt 153.2 lb

## 2021-03-15 DIAGNOSIS — C9000 Multiple myeloma not having achieved remission: Secondary | ICD-10-CM

## 2021-03-15 DIAGNOSIS — Z5112 Encounter for antineoplastic immunotherapy: Secondary | ICD-10-CM | POA: Diagnosis not present

## 2021-03-15 LAB — CMP (CANCER CENTER ONLY)
ALT: 19 U/L (ref 0–44)
AST: 18 U/L (ref 15–41)
Albumin: 4.2 g/dL (ref 3.5–5.0)
Alkaline Phosphatase: 100 U/L (ref 38–126)
Anion gap: 10 (ref 5–15)
BUN: 15 mg/dL (ref 8–23)
CO2: 22 mmol/L (ref 22–32)
Calcium: 9.1 mg/dL (ref 8.9–10.3)
Chloride: 106 mmol/L (ref 98–111)
Creatinine: 0.78 mg/dL (ref 0.44–1.00)
GFR, Estimated: >60 mL/min (ref >60)
Glucose, Bld: 149 mg/dL — ABNORMAL HIGH (ref 70–99)
Potassium: 3.6 mmol/L (ref 3.5–5.1)
Sodium: 138 mmol/L (ref 135–145)
Total Bilirubin: 0.7 mg/dL (ref 0.3–1.2)
Total Protein: 6.5 g/dL (ref 6.5–8.1)

## 2021-03-15 LAB — CBC WITH DIFFERENTIAL (CANCER CENTER ONLY)
Abs Immature Granulocytes: 0.02 10*3/uL (ref 0.00–0.07)
Basophils Absolute: 0.1 10*3/uL (ref 0.0–0.1)
Basophils Relative: 1 %
Eosinophils Absolute: 0.1 10*3/uL (ref 0.0–0.5)
Eosinophils Relative: 2 %
HCT: 33.9 % — ABNORMAL LOW (ref 36.0–46.0)
Hemoglobin: 11 g/dL — ABNORMAL LOW (ref 12.0–15.0)
Immature Granulocytes: 0 %
Lymphocytes Relative: 9 %
Lymphs Abs: 0.5 10*3/uL — ABNORMAL LOW (ref 0.7–4.0)
MCH: 30.1 pg (ref 26.0–34.0)
MCHC: 32.4 g/dL (ref 30.0–36.0)
MCV: 92.9 fL (ref 80.0–100.0)
Monocytes Absolute: 0.2 10*3/uL (ref 0.1–1.0)
Monocytes Relative: 4 %
Neutro Abs: 4.7 10*3/uL (ref 1.7–7.7)
Neutrophils Relative %: 84 %
Platelet Count: 190 10*3/uL (ref 150–400)
RBC: 3.65 MIL/uL — ABNORMAL LOW (ref 3.87–5.11)
RDW: 16.1 % — ABNORMAL HIGH (ref 11.5–15.5)
WBC Count: 5.5 10*3/uL (ref 4.0–10.5)
nRBC: 0 % (ref 0.0–0.2)

## 2021-03-15 LAB — LACTATE DEHYDROGENASE: LDH: 183 U/L (ref 98–192)

## 2021-03-15 MED ORDER — BORTEZOMIB CHEMO SQ INJECTION 3.5 MG (2.5MG/ML)
1.3000 mg/m2 | Freq: Once | INTRAMUSCULAR | Status: AC
  Administered 2021-03-15: 2.25 mg via SUBCUTANEOUS
  Filled 2021-03-15: qty 0.9

## 2021-03-15 MED ORDER — DEXAMETHASONE 4 MG PO TABS: 40.0000 mg | ORAL_TABLET | Freq: Once | ORAL | Status: DC

## 2021-03-20 ENCOUNTER — Telehealth: Payer: Self-pay | Admitting: Hematology and Oncology

## 2021-03-20 NOTE — Telephone Encounter (Signed)
Called and left msg about change in provider for 4/28 appt due to provider family emergency

## 2021-03-21 ENCOUNTER — Inpatient Hospital Stay (HOSPITAL_BASED_OUTPATIENT_CLINIC_OR_DEPARTMENT_OTHER): Payer: Medicare Other | Admitting: Hematology and Oncology

## 2021-03-21 ENCOUNTER — Other Ambulatory Visit: Payer: Self-pay

## 2021-03-21 ENCOUNTER — Ambulatory Visit: Payer: Medicare Other | Admitting: Hematology and Oncology

## 2021-03-21 ENCOUNTER — Encounter: Payer: Self-pay | Admitting: Hematology and Oncology

## 2021-03-21 ENCOUNTER — Inpatient Hospital Stay: Payer: Medicare Other

## 2021-03-21 DIAGNOSIS — Z5112 Encounter for antineoplastic immunotherapy: Secondary | ICD-10-CM | POA: Diagnosis not present

## 2021-03-21 DIAGNOSIS — D6481 Anemia due to antineoplastic chemotherapy: Secondary | ICD-10-CM | POA: Diagnosis not present

## 2021-03-21 DIAGNOSIS — C9000 Multiple myeloma not having achieved remission: Secondary | ICD-10-CM

## 2021-03-21 DIAGNOSIS — R239 Unspecified skin changes: Secondary | ICD-10-CM

## 2021-03-21 DIAGNOSIS — T451X5A Adverse effect of antineoplastic and immunosuppressive drugs, initial encounter: Secondary | ICD-10-CM | POA: Diagnosis not present

## 2021-03-21 DIAGNOSIS — R5381 Other malaise: Secondary | ICD-10-CM | POA: Diagnosis not present

## 2021-03-21 DIAGNOSIS — I2510 Atherosclerotic heart disease of native coronary artery without angina pectoris: Secondary | ICD-10-CM | POA: Diagnosis not present

## 2021-03-21 DIAGNOSIS — K5909 Other constipation: Secondary | ICD-10-CM

## 2021-03-21 DIAGNOSIS — R42 Dizziness and giddiness: Secondary | ICD-10-CM | POA: Diagnosis not present

## 2021-03-21 LAB — CBC WITH DIFFERENTIAL (CANCER CENTER ONLY)
Abs Immature Granulocytes: 0.02 10*3/uL (ref 0.00–0.07)
Basophils Absolute: 0 10*3/uL (ref 0.0–0.1)
Basophils Relative: 1 %
Eosinophils Absolute: 1.1 10*3/uL — ABNORMAL HIGH (ref 0.0–0.5)
Eosinophils Relative: 21 %
HCT: 33.4 % — ABNORMAL LOW (ref 36.0–46.0)
Hemoglobin: 10.7 g/dL — ABNORMAL LOW (ref 12.0–15.0)
Immature Granulocytes: 0 %
Lymphocytes Relative: 16 %
Lymphs Abs: 0.8 10*3/uL (ref 0.7–4.0)
MCH: 30.1 pg (ref 26.0–34.0)
MCHC: 32 g/dL (ref 30.0–36.0)
MCV: 93.8 fL (ref 80.0–100.0)
Monocytes Absolute: 0.5 10*3/uL (ref 0.1–1.0)
Monocytes Relative: 9 %
Neutro Abs: 2.7 10*3/uL (ref 1.7–7.7)
Neutrophils Relative %: 53 %
Platelet Count: 191 10*3/uL (ref 150–400)
RBC: 3.56 MIL/uL — ABNORMAL LOW (ref 3.87–5.11)
RDW: 16.7 % — ABNORMAL HIGH (ref 11.5–15.5)
WBC Count: 5.1 10*3/uL (ref 4.0–10.5)
nRBC: 0 % (ref 0.0–0.2)

## 2021-03-21 LAB — CMP (CANCER CENTER ONLY)
ALT: 21 U/L (ref 0–44)
AST: 18 U/L (ref 15–41)
Albumin: 4 g/dL (ref 3.5–5.0)
Alkaline Phosphatase: 88 U/L (ref 38–126)
Anion gap: 8 (ref 5–15)
BUN: 12 mg/dL (ref 8–23)
CO2: 27 mmol/L (ref 22–32)
Calcium: 9.5 mg/dL (ref 8.9–10.3)
Chloride: 105 mmol/L (ref 98–111)
Creatinine: 0.75 mg/dL (ref 0.44–1.00)
GFR, Estimated: >60 mL/min (ref >60)
Glucose, Bld: 87 mg/dL (ref 70–99)
Potassium: 4.5 mmol/L (ref 3.5–5.1)
Sodium: 140 mmol/L (ref 135–145)
Total Bilirubin: 0.7 mg/dL (ref 0.3–1.2)
Total Protein: 6.2 g/dL — ABNORMAL LOW (ref 6.5–8.1)

## 2021-03-21 LAB — LACTATE DEHYDROGENASE: LDH: 176 U/L (ref 98–192)

## 2021-03-21 MED ORDER — BORTEZOMIB CHEMO SQ INJECTION 3.5 MG (2.5MG/ML)
1.3000 mg/m2 | Freq: Once | INTRAMUSCULAR | Status: AC
  Administered 2021-03-21: 2.25 mg via SUBCUTANEOUS
  Filled 2021-03-21: qty 0.9

## 2021-03-21 NOTE — Assessment & Plan Note (Signed)
Recommend discontinuation of Metamucil and try daily MiraLAX

## 2021-03-21 NOTE — Assessment & Plan Note (Signed)
She uses a walker for stability due to her dizziness and vertigo sensation  She had difficulties getting referral to see physical therapist in the past but did have an appointment scheduled for next week We discussed the risk and benefits of cancer PT rehab referral and she is in agreement to proceed

## 2021-03-21 NOTE — Assessment & Plan Note (Signed)
She has mild skin changes after Velcade injection I recommend ice pack and conservative approach if she is symptomatic with pain or itching after injection

## 2021-03-21 NOTE — Progress Notes (Signed)
[facility] progress notes  Patient Care Team: [name], MD as PCP - General (Family Medicine) [name], MD as Consulting Physician (Cardiology) [name], MD (Ophthalmology)  CHIEF COMPLAINTS/PURPOSE OF VISITSunday Corn chain disease, on treatment  HISTORY OF PRESENTING ILLNESS:  Doris Wilkerson 75 y.o. female was seen today because Dr. Lorenso Courier is not available today She is here accompanied by her husband, Gerald Stabs She is a retired English as a second language teacher She started feeling unwell earlier this year with some dizziness in left-sided bone pain She underwent further evaluation including imaging study, blood work and bone marrow biopsy and was diagnosed with multiple myeloma, light chain disease She is currently receiving chemotherapy with Velcade weekly, Revlimid as well as high-dose dexamethasone She has not been started on Zometa yet awaiting dental clearance this past few months but have seen her dentist recently with no issues Her pain is better controlled She is only taking acetaminophen as needed She still have intermittent dizziness/vertigo sensation She had bruises from prior falls but they are well-healed She walks with a walker She stated she have difficulties getting physical therapy referral in the past She used to be very active She denies peripheral neuropathy from chemotherapy No nausea or changes in bowel habits although she does take fiber/Metamucil supplement without good success  I reviewed the patient's records extensive and collaborated the history with the patient. 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 Similar smaller 8 mm bone lesion in the right sacral alar. Similar  smaller bone lesion in the L4 and L5 vertebral bodies. Long segment marrow edema within the left inferior pubic ramus and ischial tuberosity, with a similar larger lesion in the atrial tuberosity measuring 4.1 x 2.9 cm.. No lesions in the proximal femora.  . Joints: Preserved without evidence of effusion or subluxation.  . Soft tissues: No soft tissue mass or fluid collection.  . Labrum: Intact  . Acetabular cartilage: Preserved  . Piriformis muscles and sciatic nerves: Symmetric.  Marland Kitchen Lower lumbosacral spine: Intact  . Tendons:Gluteus, iliopsoas, and hamstrings tendons intact.   01/11/2021 Imaging   1. Generalized osteopenia with no focal destructive or suspicious bone lesion identified radiographically. 2. Disc and endplate degeneration in the cervical spine, lumbar spine. Degenerative spondylolisthesis at L2-L3.   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 The plasma cells display lambda light chain restriction consistent with plasma cell neoplasm.  Correlation with cytogenetic and FISH studies is  recommended.    01/27/2021 Initial Diagnosis   Multiple myeloma not having achieved remission ([place])   02/01/2021 -  Chemotherapy    Patient is on Treatment Plan: MYELOMA NON-TRANSPLANT CANDIDATES VRD WEEKLY Q21D      03/21/2021 Cancer Staging   Staging form: Plasma Cell Myeloma and Plasma Cell Disorders, AJCC 8th Edition - Clinical stage from 03/21/2021: Beta-2-microglobulin (  mg/L): 2.2, Albumin (g/dL): 4.5, ISS: Stage I - Signed by Heath Lark, MD on 03/21/2021 Beta  2 microglobulin range (mg/L): Less than 3.5 Albumin range (g/dL): Greater than or equal to 3.5 Serum calcium level: Elevated Pre-operative calcium level (mg/dL): 10.4 Serum creatinine level: Normal Creatinine (mg/dL): 0.8     MEDICAL HISTORY:  Past Medical History:  Diagnosis Date  . Asthma   . Cataract   . GERD (gastroesophageal reflux disease)   . Hyperlipidemia   . Hypertension   . Thyroid disease     SURGICAL HISTORY: Past Surgical History:  Procedure Laterality Date  . Cataract Surgery    . CORONARY ANGIOPLASTY WITH STENT PLACEMENT    . EYE SURGERY    . ORTHOPEDIC SURGERY  2012  . TONSILLECTOMY  1951    SOCIAL HISTORY: Social History   Socioeconomic History  . Marital status: Married    Spouse name: Not on file  . Number of children: 0  . Years of education: Not on file  . Highest education level: Not on file  Occupational History  . Occupation: unemployed  Tobacco Use  . Smoking status: Never Smoker  . Smokeless tobacco: Never Used  Vaping Use  . Vaping Use: Never used  Substance and Sexual Activity  . Alcohol use: Yes    Alcohol/week: 1.0 standard drink    Types: 1 Glasses of wine per week    Comment: occassionally  . Drug use: No  . Sexual activity: Not Currently  Other Topics Concern  . Not on file  Social History Narrative   Married. Education: college. Pt does exercise-   Social Determinants of Health   Financial Resource Strain: Not on file  Food Insecurity: Not on file  Transportation Needs: Not on file  Physical Activity: Not on file  Stress: Not on file  Social Connections: Not on file  Intimate Partner Violence: Not on file    FAMILY HISTORY: Family History  Problem Relation Age of Onset  . Cancer Father   . Heart disease Brother   . Dementia Brother   . Leukemia Brother   . Breast cancer Neg Hx   . Colon cancer Neg Hx   . Esophageal cancer Neg Hx   . Pancreatic cancer Neg Hx   . Rectal cancer Neg Hx   . Stomach cancer  Neg Hx     ALLERGIES:  is allergic to poison ivy extract, plavix [clopidogrel bisulfate], and other.  MEDICATIONS:  Current Outpatient Medications  Medication Sig Dispense Refill  . cholecalciferol (VITAMIN D3) 25 MCG (1000 UNIT) tablet Take 2,000 Units by mouth daily.    . polyethylene glycol (MIRALAX / GLYCOLAX) 17 g packet Take 17 g by mouth daily.    Marland Kitchen acetaminophen (TYLENOL 8 HOUR ARTHRITIS PAIN) 650 MG CR tablet Take 1 tablet (650 mg total) by mouth every 8 (eight) hours as needed for pain.    Marland Kitchen acyclovir (ZOVIRAX) 400 MG tablet Take 1 tablet (400 mg total) by mouth 2 (two) times daily. 60 tablet 5  . aspirin 81 MG tablet Take 81 mg by mouth daily.     Marland Kitchen atorvastatin (LIPITOR) 80 MG tablet Take 1 tablet (80 mg total) by mouth daily at 6 PM. 90 tablet 3  . benazepril (LOTENSIN) 10 MG tablet TAKE 1 TABLET(10 MG) BY MOUTH DAILY 90 tablet 3  . calcium carbonate (OS-CAL) 1250 (500 Ca) MG chewable tablet Chew 1 tablet by mouth daily.    Marland Kitchen dexamethasone (DECADRON) 4 MG tablet Take 10  tablets (40 mg total) by mouth once a week. 40 tablet 3  . DM-APAP-CPM (CORICIDIN HBP PO) Take by mouth.     . famotidine-calcium carbonate-magnesium hydroxide (PEPCID COMPLETE) 10-800-165 MG chewable tablet Chew 1 tablet by mouth daily.    Marland Kitchen lenalidomide (REVLIMID) 25 MG capsule Take 1 capsule (25 mg total) by mouth daily. Take for 14 days; then none for 7 days. Repeat every 21 days [employer] Auth # [id number]   Date Obtained 03/06/21 14 capsule 0  . ondansetron (ZOFRAN) 8 MG tablet Take 1 tablet (8 mg total) by mouth every 8 (eight) hours as needed for nausea or vomiting. 30 tablet 0  . prochlorperazine (COMPAZINE) 10 MG tablet Take 1 tablet (10 mg total) by mouth every 6 (six) hours as needed for nausea or vomiting. 30 tablet 0   No current facility-administered medications for this visit.    REVIEW OF SYSTEMS:   Constitutional: Denies fevers, chills or abnormal night sweats Eyes: Denies blurriness of vision,  double vision or watery eyes Ears, nose, mouth, throat, and face: Denies mucositis or sore throat Respiratory: Denies cough, dyspnea or wheezes Cardiovascular: Denies palpitation, chest discomfort or lower extremity swelling Gastrointestinal:  Denies nausea, heartburn or change in bowel habits Skin: Denies abnormal skin rashes Lymphatics: Denies new lymphadenopathy or easy bruising Behavioral/Psych: Mood is stable, no new changes  All other systems were reviewed with the patient and are negative.  PHYSICAL EXAMINATION: ECOG PERFORMANCE STATUS: 1 - Symptomatic but completely ambulatory  Vitals:   03/21/21 0832  BP: (!) 116/55  Pulse: 70  Resp: 18  Temp: 99.3 F (37.4 C)  SpO2: 100%   Filed Weights   03/21/21 0832  Weight: 156 lb (70.8 kg)    GENERAL:alert, no distress and comfortable SKIN: Noted skin discoloration at prior injection sites EYES: normal, conjunctiva are pink and non-injected, sclera clear OROPHARYNX:no exudate, normal lips, buccal mucosa, and tongue  NECK: supple, thyroid normal size, non-tender, without nodularity LYMPH:  no palpable lymphadenopathy in the cervical, axillary or inguinal LUNGS: clear to auscultation and percussion with normal breathing effort HEART: regular rate & rhythm and no murmurs without lower extremity edema ABDOMEN:abdomen soft, non-tender and normal bowel sounds Musculoskeletal:no cyanosis of digits and no clubbing  PSYCH: alert & oriented x 3 with fluent speech NEURO: no focal motor/sensory deficits  LABORATORY DATA:  I have reviewed the data as listed Lab Results  Component Value Date   WBC 5.1 03/21/2021   HGB 10.7 (L) 03/21/2021   HCT 33.4 (L) 03/21/2021   MCV 93.8 03/21/2021   PLT 191 03/21/2021   Recent Labs    06/01/20 1009 12/06/20 1031 12/31/20 1423 03/08/21 1013 03/15/21 1130 03/21/21 0808  NA 144 140   < > 139 138 140  K 4.3 4.4   < > 3.9 3.6 4.5  CL 103 103   < > 103 106 105  CO2 25 24   < > '24 22 27   ' GLUCOSE 94 97   < > 96 149* 87  BUN 12 14   < > '14 15 12  ' CREATININE 0.68 0.79   < > 0.77 0.78 0.75  CALCIUM 10.2 10.4*   < > 9.5 9.1 9.5  GFRNONAA 87 74   < > >60 >60 >60  GFRAA 100 85  --   --   --   --   PROT 6.8  --    < > 7.0 6.5 6.2*  ALBUMIN 5.1*  --    < >  4.3 4.2 4.0  AST 26  --    < > '19 18 18  ' ALT 22  --    < > '22 19 21  ' ALKPHOS 86  --    < > 125 100 88  BILITOT 0.7  --    < > 0.7 0.7 0.7   < > = values in this interval not displayed.    RADIOGRAPHIC STUDIES: I have reviewed her prior imaging studies I have personally reviewed the radiological images as listed and agreed with the findings in the report.  ASSESSMENT & PLAN:  Multiple myeloma not having achieved remission ([facility]) The patient tolerated treatment very well Clinically, she is responding very well to treatment Her bone pain is much reduced and she is only taking acetaminophen Her recent light chain studies and 24-hour urine collection for light chain show VG PR response The patient has explore the risk and benefits of autologous stem cell transplant and for now, the plan would be to proceed with chemotherapy only with weekly Velcade, high-dose dexamethasone as well as Revlimid I am surprised to see how well she tolerated high-dose dexamethasone and Revlimid I would defer to Dr. Lorenso Courier to consider taper plan in the future She has normal dental examination recently She has not received Zometa I will create treatment plan order for her to receive Zometa in her next visit For now, she will focus on vitamin D supplement and calcium She will continue acyclovir for antimicrobial prophylaxis She will be on aspirin for DVT prophylaxis  Anemia due to antineoplastic chemotherapy The cause of her anemia is multifactorial She is not symptomatic Consider dose reduction if needed in the future  Vertigo She has symptoms of vertigo sensation and dizziness for some time She has not undergone formal evaluation She stated  that meclizine was not helpful We discussed the risk and benefits of ENT consult and she is in agreement for referral I am concerned about risk of fall and the myeloma patient I will send referral for her to see Dr. Lucia Gaskins  Physical debility She uses a walker for stability due to her dizziness and vertigo sensation  She had difficulties getting referral to see physical therapist in the past but did have an appointment scheduled for next week We discussed the risk and benefits of cancer PT rehab referral and she is in agreement to proceed  Skin changes related to chemotherapy She has mild skin changes after Velcade injection I recommend ice pack and conservative approach if she is symptomatic with pain or itching after injection  Other constipation Recommend discontinuation of Metamucil and try daily MiraLAX  Dizziness Her blood pressure is borderline low I am wondering whether borderline blood pressure could have contributed to her sensation of dizziness recently I recommend she start checking her blood pressure twice a day   Orders Placed This Encounter  Procedures  . Ambulatory referral to Physical Therapy    Referral Priority:   Routine    Referral Type:   Physical Medicine    Referral Reason:   Specialty Services Required    Requested Specialty:   Physical Therapy    Number of Visits Requested:   1  . Ambulatory referral to ENT    Referral Priority:   Routine    Referral Type:   Consultation    Referral Reason:   Specialty Services Required    Requested Specialty:   Otolaryngology    Number of Visits Requested:   1    All questions were  answered. The patient knows to call the clinic with any problems, questions or concerns. The total time spent in the appointment was 40 minutes encounter with patients including review of chart and various tests results, discussions about plan of care and coordination of care plan   Heath Lark, MD 03/21/2021 10:29 AM

## 2021-03-21 NOTE — Assessment & Plan Note (Signed)
Her blood pressure is borderline low I am wondering whether borderline blood pressure could have contributed to her sensation of dizziness recently I recommend she start checking her blood pressure twice a day

## 2021-03-21 NOTE — Assessment & Plan Note (Addendum)
She has symptoms of vertigo sensation and dizziness for some time She has not undergone formal evaluation She stated that meclizine was not helpful We discussed the risk and benefits of ENT consult and she is in agreement for referral I am concerned about risk of fall and the myeloma patient I will send referral for her to see Dr. Lucia Gaskins

## 2021-03-21 NOTE — Assessment & Plan Note (Signed)
The patient tolerated treatment very well Clinically, she is responding very well to treatment Her bone pain is much reduced and she is only taking acetaminophen Her recent light chain studies and 24-hour urine collection for light chain show VG PR response The patient has explore the risk and benefits of autologous stem cell transplant and for now, the plan would be to proceed with chemotherapy only with weekly Velcade, high-dose dexamethasone as well as Revlimid I am surprised to see how well she tolerated high-dose dexamethasone and Revlimid I would defer to Dr. Lorenso Courier to consider taper plan in the future She has normal dental examination recently She has not received Zometa I will create treatment plan order for her to receive Zometa in her next visit For now, she will focus on vitamin D supplement and calcium She will continue acyclovir for antimicrobial prophylaxis She will be on aspirin for DVT prophylaxis

## 2021-03-21 NOTE — Assessment & Plan Note (Signed)
The cause of her anemia is multifactorial She is not symptomatic Consider dose reduction if needed in the future

## 2021-03-21 NOTE — Patient Instructions (Signed)
[facility] Discharge Instructions for Patients Receiving Chemotherapy  Today you received the following chemotherapy agents: bortezomib.  To help prevent nausea and vomiting after your treatment, we encourage you to take your nausea medication as directed.   If you develop nausea and vomiting that is not controlled by your nausea medication, call the clinic.   BELOW ARE SYMPTOMS THAT SHOULD BE REPORTED IMMEDIATELY:  *FEVER GREATER THAN 100.5 F  *CHILLS WITH OR WITHOUT FEVER  NAUSEA AND VOMITING THAT IS NOT CONTROLLED WITH YOUR NAUSEA MEDICATION  *UNUSUAL SHORTNESS OF BREATH  *UNUSUAL BRUISING OR BLEEDING  TENDERNESS IN MOUTH AND THROAT WITH OR WITHOUT PRESENCE OF ULCERS  *URINARY PROBLEMS  *BOWEL PROBLEMS  UNUSUAL RASH Items with * indicate a potential emergency and should be followed up as soon as possible.  Feel free to call the clinic should you have any questions or concerns. The clinic phone number is [phone].  Please show the CHEMO ALERT CARD at check-in to the Emergency Department and triage nurse.   

## 2021-03-22 ENCOUNTER — Encounter: Payer: Self-pay | Admitting: Hematology and Oncology

## 2021-03-25 ENCOUNTER — Other Ambulatory Visit: Payer: Self-pay | Admitting: *Deleted

## 2021-03-25 DIAGNOSIS — C9000 Multiple myeloma not having achieved remission: Secondary | ICD-10-CM

## 2021-03-25 MED ORDER — LENALIDOMIDE 25 MG PO CAPS: 25.0000 mg | ORAL_CAPSULE | Freq: Every day | ORAL | 0 refills | Status: DC

## 2021-03-26 ENCOUNTER — Ambulatory Visit: Payer: Medicare Other | Attending: Hematology and Oncology | Admitting: Physical Therapy

## 2021-03-26 ENCOUNTER — Telehealth: Payer: Self-pay | Admitting: Critical Care Medicine

## 2021-03-26 ENCOUNTER — Other Ambulatory Visit: Payer: Self-pay

## 2021-03-26 DIAGNOSIS — R262 Difficulty in walking, not elsewhere classified: Secondary | ICD-10-CM | POA: Insufficient documentation

## 2021-03-26 DIAGNOSIS — Z9181 History of falling: Secondary | ICD-10-CM | POA: Insufficient documentation

## 2021-03-26 NOTE — Telephone Encounter (Signed)
   Doris Wilkerson DOB: 1946/05/26 MRN: [id number]   RIDER WAIVER AND RELEASE OF LIABILITY  For purposes of improving physical access to our facilities, [facility] is pleased to partner with third parties to provide [facility] patients or other authorized individuals the option of convenient, on-demand ground transportation services (the [place]") through use of the technology service that enables users to request on-demand ground transportation from independent third-party providers.  By opting to use and accept these [employer], I, the undersigned, hereby agree on behalf of myself, and on behalf of any minor child using the [employer] for whom I am the parent or legal guardian, as follows:  1. Government social research officer provided to me are provided by independent third-party transportation providers who are not [employer] or employees and who are unaffiliated with [employer]. 2. [facility] is neither a transportation carrier nor a common or public carrier. 3. [facility] has no control over the quality or safety of the transportation that occurs as a result of the [employer]. 4. [facility] cannot guarantee that any third-party transportation provider will complete any arranged transportation service. 5. [facility] makes no representation, warranty, or guarantee regarding the reliability, timeliness, quality, safety, suitability, or availability of any of the Transport Services or that they will be error free. 6. I fully understand that traveling by vehicle involves risks and dangers of serious bodily injury, including permanent disability, paralysis, and death. I agree, on behalf of myself and on behalf of any minor child using the Transport Services for whom I am the parent or legal guardian, that the entire risk arising out of my use of the [employer] remains solely with me, to the maximum extent permitted under applicable law. 7. The [employer] are provided "as is" and "as available." [facility] disclaims all representations and warranties, express, implied or statutory, not expressly set out in these terms, including the implied warranties of merchantability and fitness for a particular purpose. 8. I hereby waive and release [facility], its agents, employees, officers, directors, representatives, insurers, attorneys, assigns, successors, subsidiaries, and affiliates from any and all past, present, or future claims, demands, liabilities, actions, causes of action, or suits of any kind directly or indirectly arising from acceptance and use of the [employer]. 9. I further waive and release [facility] and its affiliates from all present and future liability and responsibility for any injury or death to persons or damages to property caused by or related to the use of the [employer]. 10. I have read this Waiver and Release of Liability, and I understand the terms used in it and their legal significance. This Waiver is freely and voluntarily given with the understanding that my right (as well as the right of any minor child for whom I am the parent or legal guardian using the [employer]) to legal recourse against  in connection with the [employer] is knowingly surrendered in return for use of these services.   I attest that I read the consent document to Andrey Spearman, gave Ms. Yeley the opportunity to ask questions and answered the questions asked (if any). I affirm that Brownie Gockel then provided consent for she's participation in this program.     Drucie Ip

## 2021-03-26 NOTE — Patient Instructions (Signed)
Access Code: [email or web address] URL: [email or web address] Date: 03/26/2021 Prepared by: Everardo All  Exercises Supine Bridge - 1 x daily - 7 x weekly - 2 sets - 10 reps Supine Posterior Pelvic Tilt - 1 x daily - 7 x weekly - 1 sets - 10 reps - 5s hold Hooklying Clamshell with Resistance - 1 x daily - 7 x weekly - 2 sets - 10 reps Seated Hip Adduction Isometrics with Ball - 1 x daily - 7 x weekly - 1 sets - 10 reps - 5s hold

## 2021-03-26 NOTE — Therapy (Signed)
[facility] [address] [place], [place], [address] Phone: [phone]   Fax:  [phone]  Physical Therapy Evaluation  Patient Details  Name: Doris Wilkerson MRN: [id number] Date of Birth: [birth year] Referring Provider (PT): Narda Rutherford, MD   Encounter Date: 03/26/2021   PT End of Session - 03/26/21 1601    Visit Number 1    Date for PT Re-Evaluation 05/24/21    Authorization Type Medicare    Progress Note Due on Visit 10    PT Start Time 1400    PT Stop Time 1445    PT Time Calculation (min) 45 min    Activity Tolerance Patient tolerated treatment well    Behavior During Therapy [facility] for tasks assessed/performed           Past Medical History:  Diagnosis Date  . Asthma   . Cataract   . GERD (gastroesophageal reflux disease)   . Hyperlipidemia   . Hypertension   . Thyroid disease     Past Surgical History:  Procedure Laterality Date  . Cataract Surgery    . CORONARY ANGIOPLASTY WITH STENT PLACEMENT    . EYE SURGERY    . ORTHOPEDIC SURGERY  2012  . TONSILLECTOMY  1951    There were no vitals filed for this visit.    Subjective Assessment - 03/26/21 1406    Subjective Patient presenting due to deconditioning secondary to chemo due to multiple myeloma which she was diagonosed with February22, 2022. Is currently in week 8 of 24 weeks of chemo. Had fractured her Lt pubic rami December 19, 2020 when rapidly changing direction to catch a shopping cart. There was minimal medical intervention for fracture and she was allowed to ambulate as tolerated. [place] mid March 2022. Is unsure what led to the fall but was not injured. Uses SPC for mobility most times. Uses rollator to report to chemo treatments and when ambulating in the at night to go to bathroom or going down the hall.    Pertinent History multiple myeloma (current)    Limitations Sitting;Walking;Standing    How long can you sit comfortably? 20  minutes with extra cushioning    How long can you stand comfortably? unlimited    How long can you walk comfortably? 15-20 minutes    Patient Stated Goals more mobile; decreased falls; get through remainder of chemotherapy (16 more weeks)    Currently in Pain? Yes    Pain Score 2     Pain Location Pelvis    Pain Orientation Left    Pain Descriptors / Indicators Aching    Pain Type Acute pain    Pain Onset More than a month ago    Pain Frequency Intermittent    Aggravating Factors  prolonged sitting              [facility] PT Assessment - 03/26/21 0001      Assessment   Medical Diagnosis R53.81 (ICD-10-CM) - Physical debility    Referring Provider (PT) Narda Rutherford, MD    Onset Date/Surgical Date 12/19/20    Hand Dominance Right    Next MD Visit None    Prior Therapy Yes      Precautions   Precaution Comments actively in chemo      Restrictions   Weight Bearing Restrictions No      Balance Screen   Has the patient fallen in the past 6 months Yes    How many times? multiple  Has the patient had a decrease in activity level because of a fear of falling?  No    Is the patient reluctant to leave their home because of a fear of falling?  No      Prior Function   Level of Independence Independent      Cognition   Overall Cognitive Status Within Functional Limits for tasks assessed      Observation/Other Assessments   Focus on Therapeutic Outcomes (FOTO)  36      ROM / Strength   AROM / PROM / Strength Strength      Strength   Overall Strength Comments bil LE grossly 4+/5; hip adduction 4/5 bilaterally      Transfers   Five time sit to stand comments  18s                      Objective measurements completed on examination: See above findings.               PT Education - 03/26/21 1450    Education Details Access Code: [email or web address]    Person(s) Educated Patient    Methods Explanation;Demonstration;Tactile cues;Verbal cues;Handout     Comprehension Verbalized understanding;Returned demonstration;Verbal cues required;Tactile cues required            PT Short Term Goals - 03/26/21 1450      PT SHORT TERM GOAL #1   Title Patient will be independent with HEP for continued progression at home.    Time 4    Period Weeks    Status New    Target Date 04/23/21      PT SHORT TERM GOAL #2   Title Patient will complete 6 minute walk test without use of AD with minimal gait impairments and no reports of increased bone pain.    Time 4    Period Weeks    Status New    Target Date 04/23/21             PT Long Term Goals - 03/26/21 1558      PT LONG TERM GOAL #1   Title Patient will improve FOTO score to 60 to indicate improved overall function.    Time 8    Period Weeks    Status New    Target Date 05/21/21      PT LONG TERM GOAL #2   Title Patient will be independent with advanced HEP for long term management of symptoms post D/C.    Time 8    Period Weeks    Status New    Target Date 05/21/21      PT LONG TERM GOAL #3   Title Patient will complete five time sit to stand in </= 13s to indicate decreased fall risk.    Baseline 18s    Time 8    Period Weeks    Status New    Target Date 05/21/21                  Plan - 03/26/21 1435    Clinical Impression Statement Patient is a 75 y/o female referred due to physical debility secondary to chemotherapy. PMH includes multiple myeloma (current). Patient reporting activity limitations include prolonged sitting and prolonged ambulation. Patient demonstrates bil LE strength impairments as bil hip adduction 4/5. Patient requiring 18s to complete five time sit to stand indicating increased fall risk and decreased bil LE muscular endurance. Would benefit from skilled therapeutic intervention to address impairments for improved  functional mobility and functional activity tolerance.    Personal Factors and Comorbidities Comorbidity 1    Comorbidities Cancer  (current)    Examination-Activity Limitations Sit;Locomotion Level;Transfers    Examination-Participation Restrictions Community Activity;Cleaning    Stability/Clinical Decision Making Evolving/Moderate complexity    Clinical Decision Making Moderate    Rehab Potential Fair    PT Frequency 2x / week    PT Duration 8 weeks    PT Treatment/Interventions ADLs/Self Care Home Management;Aquatic Therapy;Gait training;Functional mobility training;Therapeutic activities;Therapeutic exercise;Balance training;Neuromuscular re-education;Patient/family education    PT Next Visit Plan review response to HEP; dynamic and static balance; general total body strengthening    PT Home Exercise Plan Access Code [email or web address]    Consulted and Agree with Plan of Care Patient           Patient will benefit from skilled therapeutic intervention in order to improve the following deficits and impairments:  Decreased activity tolerance,Abnormal gait,Difficulty walking,Pain  Visit Diagnosis: Difficulty in walking, not elsewhere classified - Plan: PT plan of care cert/re-cert  History of falling - Plan: PT plan of care cert/re-cert     Problem List Patient Active Problem List   Diagnosis Date Noted  . Physical debility 03/21/2021  . Dizziness 03/21/2021  . Vertigo 03/21/2021  . Anemia due to antineoplastic chemotherapy 03/21/2021  . Skin changes related to chemotherapy 03/21/2021  . Other constipation 03/21/2021  . Multiple myeloma not having achieved remission ([place]) 01/27/2021  . Hordeolum externum of left lower eyelid 06/28/2018  . CAD (coronary artery disease) 12/09/2011  . Dyslipidemia 12/09/2011  . Osteopenia 12/09/2011  . Hypothyroid 12/09/2011  . Asthma 12/09/2011    Everardo All PT, DPT  03/26/21 4:09 PM    [facility] [address], [place], [place], [address] Phone: [phone]   Fax:  [phone]  Name: Doris Wilkerson MRN: [id number] Date of Birth: 1946-05-22

## 2021-03-29 ENCOUNTER — Inpatient Hospital Stay: Payer: Medicare Other | Attending: Hematology and Oncology

## 2021-03-29 ENCOUNTER — Other Ambulatory Visit: Payer: Self-pay

## 2021-03-29 ENCOUNTER — Inpatient Hospital Stay: Payer: Medicare Other

## 2021-03-29 VITALS — BP 136/59 | HR 80 | Temp 98.5°F | Resp 16

## 2021-03-29 DIAGNOSIS — Z7952 Long term (current) use of systemic steroids: Secondary | ICD-10-CM | POA: Diagnosis not present

## 2021-03-29 DIAGNOSIS — C9000 Multiple myeloma not having achieved remission: Secondary | ICD-10-CM | POA: Insufficient documentation

## 2021-03-29 DIAGNOSIS — Z5112 Encounter for antineoplastic immunotherapy: Secondary | ICD-10-CM | POA: Insufficient documentation

## 2021-03-29 DIAGNOSIS — Z79899 Other long term (current) drug therapy: Secondary | ICD-10-CM | POA: Insufficient documentation

## 2021-03-29 DIAGNOSIS — Z7982 Long term (current) use of aspirin: Secondary | ICD-10-CM | POA: Diagnosis not present

## 2021-03-29 LAB — CMP (CANCER CENTER ONLY)
ALT: 20 U/L (ref 0–44)
AST: 18 U/L (ref 15–41)
Albumin: 3.9 g/dL (ref 3.5–5.0)
Alkaline Phosphatase: 85 U/L (ref 38–126)
Anion gap: 8 (ref 5–15)
BUN: 13 mg/dL (ref 8–23)
CO2: 22 mmol/L (ref 22–32)
Calcium: 9 mg/dL (ref 8.9–10.3)
Chloride: 108 mmol/L (ref 98–111)
Creatinine: 0.71 mg/dL (ref 0.44–1.00)
GFR, Estimated: >60 mL/min (ref >60)
Glucose, Bld: 97 mg/dL (ref 70–99)
Potassium: 4.2 mmol/L (ref 3.5–5.1)
Sodium: 138 mmol/L (ref 135–145)
Total Bilirubin: 0.8 mg/dL (ref 0.3–1.2)
Total Protein: 6.1 g/dL — ABNORMAL LOW (ref 6.5–8.1)

## 2021-03-29 LAB — CBC WITH DIFFERENTIAL (CANCER CENTER ONLY)
Abs Immature Granulocytes: 0.03 10*3/uL (ref 0.00–0.07)
Basophils Absolute: 0.1 10*3/uL (ref 0.0–0.1)
Basophils Relative: 1 %
Eosinophils Absolute: 0.7 10*3/uL — ABNORMAL HIGH (ref 0.0–0.5)
Eosinophils Relative: 9 %
HCT: 31.6 % — ABNORMAL LOW (ref 36.0–46.0)
Hemoglobin: 10.6 g/dL — ABNORMAL LOW (ref 12.0–15.0)
Immature Granulocytes: 0 %
Lymphocytes Relative: 9 %
Lymphs Abs: 0.6 10*3/uL — ABNORMAL LOW (ref 0.7–4.0)
MCH: 30.8 pg (ref 26.0–34.0)
MCHC: 33.5 g/dL (ref 30.0–36.0)
MCV: 91.9 fL (ref 80.0–100.0)
Monocytes Absolute: 0.8 10*3/uL (ref 0.1–1.0)
Monocytes Relative: 11 %
Neutro Abs: 5 10*3/uL (ref 1.7–7.7)
Neutrophils Relative %: 70 %
Platelet Count: 225 10*3/uL (ref 150–400)
RBC: 3.44 MIL/uL — ABNORMAL LOW (ref 3.87–5.11)
RDW: 16.4 % — ABNORMAL HIGH (ref 11.5–15.5)
WBC Count: 7.1 10*3/uL (ref 4.0–10.5)
nRBC: 0 % (ref 0.0–0.2)

## 2021-03-29 LAB — LACTATE DEHYDROGENASE: LDH: 187 U/L (ref 98–192)

## 2021-03-29 MED ORDER — ZOLEDRONIC ACID 4 MG/100ML IV SOLN
INTRAVENOUS | Status: AC
  Filled 2021-03-29: qty 100

## 2021-03-29 MED ORDER — ZOLEDRONIC ACID 4 MG/100ML IV SOLN
4.0000 mg | Freq: Once | INTRAVENOUS | Status: AC
  Administered 2021-03-29: 4 mg via INTRAVENOUS

## 2021-03-29 MED ORDER — BORTEZOMIB CHEMO SQ INJECTION 3.5 MG (2.5MG/ML)
1.3000 mg/m2 | Freq: Once | INTRAMUSCULAR | Status: AC
  Administered 2021-03-29: 2.25 mg via SUBCUTANEOUS
  Filled 2021-03-29: qty 0.9

## 2021-03-29 NOTE — Patient Instructions (Signed)
[facility]  Discharge Instructions: Thank you for choosing [facility] to provide your oncology and hematology care.   If you have a lab appointment with the Cancer Center, please go directly to the Cancer Center and check in at the registration area.   Wear comfortable clothing and clothing appropriate for easy access to any Portacath or PICC line.   We strive to give you quality time with your provider. You may need to reschedule your appointment if you arrive late (15 or more minutes).  Arriving late affects you and other patients whose appointments are after yours.  Also, if you miss three or more appointments without notifying the office, you may be dismissed from the clinic at the provider's discretion.      For prescription refill requests, have your pharmacy contact our office and allow 72 hours for refills to be completed.    Today you received the following chemotherapy and/or immunotherapy agents: [email or web address]      To help prevent nausea and vomiting after your treatment, we encourage you to take your nausea medication as directed.  BELOW ARE SYMPTOMS THAT SHOULD BE REPORTED IMMEDIATELY: . *FEVER GREATER THAN 100.4 F (38 C) OR HIGHER . *CHILLS OR SWEATING . *NAUSEA AND VOMITING THAT IS NOT CONTROLLED WITH YOUR NAUSEA MEDICATION . *UNUSUAL SHORTNESS OF BREATH . *UNUSUAL BRUISING OR BLEEDING . *URINARY PROBLEMS (pain or burning when urinating, or frequent urination) . *BOWEL PROBLEMS (unusual diarrhea, constipation, pain near the anus) . TENDERNESS IN MOUTH AND THROAT WITH OR WITHOUT PRESENCE OF ULCERS (sore throat, sores in mouth, or a toothache) . UNUSUAL RASH, SWELLING OR PAIN  . UNUSUAL VAGINAL DISCHARGE OR ITCHING   Items with * indicate a potential emergency and should be followed up as soon as possible or go to the Emergency Department if any problems should occur.  Please show the CHEMOTHERAPY ALERT CARD or IMMUNOTHERAPY ALERT  CARD at check-in to the Emergency Department and triage nurse.  Should you have questions after your visit or need to cancel or reschedule your appointment, please contact [facility]  Dept: [phone]  and follow the prompts.  Office hours are 8:00 a.m. to 4:30 p.m. Monday - Friday. Please note that voicemails left after 4:00 p.m. may not be returned until the following business day.  We are closed weekends and major holidays. You have access to a nurse at all times for urgent questions. Please call the main number to the clinic Dept: [phone] and follow the prompts.   For any non-urgent questions, you may also contact your provider using MyChart. We now offer e-Visits for anyone 18 and older to request care online for non-urgent symptoms. For details visit [email or web address].   Also download the MyChart app! Go to the app store, search "MyChart", open the app, select [place] [facility], and log in with your MyChart username and password.  Due to Covid, a mask is required upon entering the hospital/clinic. If you do not have a mask, one will be given to you upon arrival. For doctor visits, patients may have 1 support person aged 18 or older with them. For treatment visits, patients cannot have anyone with them due to current Covid guidelines and our immunocompromised population.   Zoledronic Acid Injection (Hypercalcemia, Oncology) What is this medicine? ZOLEDRONIC ACID (ZOE le dron ik AS id) slows calcium loss from bones. It high calcium levels in the blood from some kinds of cancer. It may be used in   other people at risk for bone loss. This medicine may be used for other purposes; ask your health care provider or pharmacist if you have questions. COMMON BRAND NAME(S): Zometa What should I tell my health care provider before I take this medicine? They need to know if you have any of these conditions:  cancer  dehydration  dental disease  kidney  disease  liver disease  low levels of calcium in the blood  lung or breathing disease (asthma)  receiving steroids like dexamethasone or prednisone  an unusual or allergic reaction to zoledronic acid, other medicines, foods, dyes, or preservatives  pregnant or trying to get pregnant  breast-feeding How should I use this medicine? This drug is injected into a vein. It is given by a health care provider in a hospital or clinic setting. Talk to your health care provider about the use of this drug in children. Special care may be needed. Overdosage: If you think you have taken too much of this medicine contact a poison control center or emergency room at once. NOTE: This medicine is only for you. Do not share this medicine with others. What if I miss a dose? Keep appointments for follow-up doses. It is important not to miss your dose. Call your health care provider if you are unable to keep an appointment. What may interact with this medicine?  certain antibiotics given by injection  NSAIDs, medicines for pain and inflammation, like ibuprofen or naproxen  some diuretics like bumetanide, furosemide  teriparatide  thalidomide This list may not describe all possible interactions. Give your health care provider a list of all the medicines, herbs, non-prescription drugs, or dietary supplements you use. Also tell them if you smoke, drink alcohol, or use illegal drugs. Some items may interact with your medicine. What should I watch for while using this medicine? Visit your health care provider for regular checks on your progress. It may be some time before you see the benefit from this drug. Some people who take this drug have severe bone, joint, or muscle pain. This drug may also increase your risk for jaw problems or a broken thigh bone. Tell your health care provider right away if you have severe pain in your jaw, bones, joints, or muscles. Tell you health care provider if you have any  pain that does not go away or that gets worse. Tell your dentist and dental surgeon that you are taking this drug. You should not have major dental surgery while on this drug. See your dentist to have a dental exam and fix any dental problems before starting this drug. Take good care of your teeth while on this drug. Make sure you see your dentist for regular follow-up appointments. You should make sure you get enough calcium and vitamin D while you are taking this drug. Discuss the foods you eat and the vitamins you take with your health care provider. Check with your health care provider if you have severe diarrhea, nausea, and vomiting, or if you sweat a lot. The loss of too much body fluid may make it dangerous for you to take this drug. You may need blood work done while you are taking this drug. Do not become pregnant while taking this drug. Women should inform their health care provider if they wish to become pregnant or think they might be pregnant. There is potential for serious harm to an unborn child. Talk to your health care provider for more information. What side effects may I notice from   receiving this medicine? Side effects that you should report to your doctor or health care provider as soon as possible:  allergic reactions (skin rash, itching or hives; swelling of the face, lips, or tongue)  bone pain  infection (fever, chills, cough, sore throat, pain or trouble passing urine)  jaw pain, especially after dental work  joint pain  kidney injury (trouble passing urine or change in the amount of urine)  low blood pressure (dizziness; feeling faint or lightheaded, falls; unusually weak or tired)  low calcium levels (fast heartbeat; muscle cramps or pain; pain, tingling, or numbness in the hands or feet; seizures)  low magnesium levels (fast, irregular heartbeat; muscle cramp or pain; muscle weakness; tremors; seizures)  low red blood cell counts (trouble breathing; feeling  faint; lightheaded, falls; unusually weak or tired)  muscle pain  redness, blistering, peeling, or loosening of the skin, including inside the mouth  severe diarrhea  swelling of the ankles, feet, hands  trouble breathing Side effects that usually do not require medical attention (report to your doctor or health care provider if they continue or are bothersome):  anxious  constipation  coughing  depressed mood  eye irritation, itching, or pain  fever  general ill feeling or flu-like symptoms  nausea  pain, redness, or irritation at site where injected  trouble sleeping This list may not describe all possible side effects. Call your doctor for medical advice about side effects. You may report side effects to FDA at 1-800-FDA-1088. Where should I keep my medicine? This drug is given in a hospital or clinic. It will not be stored at home. NOTE: This sheet is a summary. It may not cover all possible information. If you have questions about this medicine, talk to your doctor, pharmacist, or health care provider.  2021 Elsevier/Gold Standard (2019-08-25 09:13:00)  

## 2021-04-01 LAB — UPEP/UIFE/LIGHT CHAINS/TP, 24-HR UR
% BETA, Urine: 0 %
ALPHA 1 URINE: 0 %
Albumin, U: 0 %
Alpha 2, Urine: 0 %
Free Kappa Lt Chains,Ur: 6.66 mg/L (ref 1.17–86.46)
Free Kappa/Lambda Ratio: 1.13 — ABNORMAL LOW (ref 1.83–14.26)
Free Lambda Lt Chains,Ur: 5.92 mg/L (ref 0.27–15.21)
GAMMA GLOBULIN URINE: 0 %
Total Protein, Urine-Ur/day: <106 mg/24 hr (ref 30–150)
Total Protein, Urine: <4 mg/dL
Total Volume: 2650

## 2021-04-01 LAB — KAPPA/LAMBDA LIGHT CHAINS
Kappa free light chain: 18.1 mg/L (ref 3.3–19.4)
Kappa, lambda light chain ratio: 0.19 — ABNORMAL LOW (ref 0.26–1.65)
Lambda free light chains: 94.4 mg/L — ABNORMAL HIGH (ref 5.7–26.3)

## 2021-04-02 ENCOUNTER — Ambulatory Visit: Payer: Medicare Other

## 2021-04-02 LAB — MULTIPLE MYELOMA PANEL, SERUM
Albumin SerPl Elph-Mcnc: 3.5 g/dL (ref 2.9–4.4)
Albumin/Glob SerPl: 1.8 — ABNORMAL HIGH (ref 0.7–1.7)
Alpha 1: 0.2 g/dL (ref 0.0–0.4)
Alpha2 Glob SerPl Elph-Mcnc: 0.7 g/dL (ref 0.4–1.0)
B-Globulin SerPl Elph-Mcnc: 0.7 g/dL (ref 0.7–1.3)
Gamma Glob SerPl Elph-Mcnc: 0.4 g/dL (ref 0.4–1.8)
Globulin, Total: 2 g/dL — ABNORMAL LOW (ref 2.2–3.9)
IgA: 62 mg/dL — ABNORMAL LOW (ref 64–422)
IgG (Immunoglobin G), Serum: 421 mg/dL — ABNORMAL LOW (ref 586–1602)
IgM (Immunoglobulin M), Srm: 23 mg/dL — ABNORMAL LOW (ref 26–217)
Total Protein ELP: 5.5 g/dL — ABNORMAL LOW (ref 6.0–8.5)

## 2021-04-03 ENCOUNTER — Ambulatory Visit: Payer: Medicare Other | Admitting: Physical Therapy

## 2021-04-03 ENCOUNTER — Other Ambulatory Visit: Payer: Self-pay

## 2021-04-03 DIAGNOSIS — R262 Difficulty in walking, not elsewhere classified: Secondary | ICD-10-CM | POA: Diagnosis not present

## 2021-04-03 DIAGNOSIS — Z9181 History of falling: Secondary | ICD-10-CM | POA: Diagnosis not present

## 2021-04-03 NOTE — Therapy (Signed)
[facility] [address], [place], [place], [address] Phone: [phone]   Fax:  [phone]  Physical Therapy Treatment  Patient Details  Name: Doris Wilkerson MRN: [id number] Date of Birth: 1946/02/01 Referring Provider (PT): Narda Rutherford, MD   Encounter Date: 04/03/2021   PT End of Session - 04/03/21 1442    Visit Number 2    Date for PT Re-Evaluation 05/24/21    Authorization Type Medicare    Progress Note Due on Visit 10    PT Start Time 1400    PT Stop Time 8144    PT Time Calculation (min) 39 min    Activity Tolerance Patient tolerated treatment well;No increased pain    Behavior During Therapy WFL for tasks assessed/performed           Past Medical History:  Diagnosis Date  . Asthma   . Cataract   . GERD (gastroesophageal reflux disease)   . Hyperlipidemia   . Hypertension   . Thyroid disease     Past Surgical History:  Procedure Laterality Date  . Cataract Surgery    . CORONARY ANGIOPLASTY WITH STENT PLACEMENT    . EYE SURGERY    . ORTHOPEDIC SURGERY  2012  . TONSILLECTOMY  1951    There were no vitals filed for this visit.   Subjective Assessment - 04/03/21 1407    Subjective Has been compliant with HEP. Had some SI pain with pelvic tilt exercise. States that this may also be a side effect of new chemo medication.    Pertinent History multiple myeloma (current)    Limitations Sitting;Walking;Standing    How long can you sit comfortably? 20 minutes with extra cushioning    How long can you stand comfortably? unlimited    How long can you walk comfortably? 15-20 minutes    Patient Stated Goals more mobile; decreased falls; get through remainder of chemotherapy (16 more weeks)    Currently in Pain? Yes    Pain Score 3     Pain Location Pelvis   ischial tuberosity   Pain Orientation Left    Pain Descriptors / Indicators Discomfort    Pain Type Acute pain    Pain Onset More than a month  ago                             [facility] Adult PT Treatment/Exercise - 04/03/21 0001      Neuro Re-ed    Neuro Re-ed Details  black foam; feet together x30s; modified tandem Rt/Lt x30s each. Seated on blue ball; chops 2 lbs; x10 Rt/Lt      Lumbar Exercises: Standing   Shoulder Extension Both;15 reps    Theraband Level (Shoulder Extension) Level 2 (Red)    Other Standing Lumbar Exercises anti-rotation press; red tband x15 each side    Other Standing Lumbar Exercises farmer carries 10lb kettlebell; carpet to carpet Rt/Lt      Lumbar Exercises: Seated   Other Seated Lumbar Exercises ball squeeze; x10 x 5s hold      Knee/Hip Exercises: Seated   Sit to Sand 1 set;10 reps;without UE support   from black foam                   PT Short Term Goals - 04/03/21 1442      PT SHORT TERM GOAL #1   Title Patient will be independent with HEP for continued progression at  home.    Time 4    Period Weeks    Status On-going   Patient reports compliance   Target Date 04/23/21             PT Long Term Goals - 03/26/21 1558      PT LONG TERM GOAL #1   Title Patient will improve FOTO score to 60 to indicate improved overall function.    Time 8    Period Weeks    Status New    Target Date 05/21/21      PT LONG TERM GOAL #2   Title Patient will be independent with advanced HEP for long term management of symptoms post D/C.    Time 8    Period Weeks    Status New    Target Date 05/21/21      PT LONG TERM GOAL #3   Title Patient will complete five time sit to stand in </= 13s to indicate decreased fall risk.    Baseline 18s    Time 8    Period Weeks    Status New    Target Date 05/21/21                 Plan - 04/03/21 1440    Clinical Impression Statement Patient demonstrates continues intermittent fear avoidance with regards to Lt hip. Therapist providing maximal encouragement for continued mobility within safe parameters such as not twisting and  no hyperextension or hyper flexion. Patient verbalizing understanding. Noting Lt LE off loading during sit to stand activity will continue to address in future sessions.    Personal Factors and Comorbidities Comorbidity 1    Comorbidities Cancer (current)    Examination-Activity Limitations Sit;Locomotion Level;Transfers    Examination-Participation Restrictions Community Activity;Cleaning    Rehab Potential Fair    PT Frequency 2x / week    PT Duration 8 weeks    PT Treatment/Interventions ADLs/Self Care Home Management;Aquatic Therapy;Gait training;Functional mobility training;Therapeutic activities;Therapeutic exercise;Balance training;Neuromuscular re-education;Patient/family education    PT Next Visit Plan continue dynamic and static balance; general total body strengthening and stabilization    PT Home Exercise Plan Access Code [email or web address]    Consulted and Agree with Plan of Care Patient           Patient will benefit from skilled therapeutic intervention in order to improve the following deficits and impairments:  Decreased activity tolerance,Abnormal gait,Difficulty walking,Pain  Visit Diagnosis: Difficulty in walking, not elsewhere classified  History of falling     Problem List Patient Active Problem List   Diagnosis Date Noted  . Physical debility 03/21/2021  . Dizziness 03/21/2021  . Vertigo 03/21/2021  . Anemia due to antineoplastic chemotherapy 03/21/2021  . Skin changes related to chemotherapy 03/21/2021  . Other constipation 03/21/2021  . Multiple myeloma not having achieved remission ([place]) 01/27/2021  . Hordeolum externum of left lower eyelid 06/28/2018  . CAD (coronary artery disease) 12/09/2011  . Dyslipidemia 12/09/2011  . Osteopenia 12/09/2011  . Hypothyroid 12/09/2011  . Asthma 12/09/2011   Everardo All PT, DPT  04/03/21 2:43 PM   [facility] [address] [place], [place],  [address] Phone: [phone]   Fax:  [phone]  Name: Doris Wilkerson MRN: [id number] Date of Birth: 27-Jul-1946

## 2021-04-05 ENCOUNTER — Inpatient Hospital Stay: Payer: Medicare Other

## 2021-04-05 ENCOUNTER — Inpatient Hospital Stay (HOSPITAL_BASED_OUTPATIENT_CLINIC_OR_DEPARTMENT_OTHER): Payer: Medicare Other | Admitting: Hematology and Oncology

## 2021-04-05 ENCOUNTER — Other Ambulatory Visit: Payer: Self-pay | Admitting: Hematology and Oncology

## 2021-04-05 ENCOUNTER — Encounter: Payer: Self-pay | Admitting: *Deleted

## 2021-04-05 ENCOUNTER — Other Ambulatory Visit: Payer: Self-pay

## 2021-04-05 VITALS — BP 127/66 | HR 76 | Temp 98.6°F | Resp 17 | Wt 155.4 lb

## 2021-04-05 DIAGNOSIS — C9000 Multiple myeloma not having achieved remission: Secondary | ICD-10-CM | POA: Diagnosis not present

## 2021-04-05 DIAGNOSIS — Z5112 Encounter for antineoplastic immunotherapy: Secondary | ICD-10-CM | POA: Diagnosis not present

## 2021-04-05 DIAGNOSIS — Z79899 Other long term (current) drug therapy: Secondary | ICD-10-CM | POA: Diagnosis not present

## 2021-04-05 DIAGNOSIS — M899 Disorder of bone, unspecified: Secondary | ICD-10-CM | POA: Diagnosis not present

## 2021-04-05 DIAGNOSIS — Z7952 Long term (current) use of systemic steroids: Secondary | ICD-10-CM | POA: Diagnosis not present

## 2021-04-05 DIAGNOSIS — T451X5A Adverse effect of antineoplastic and immunosuppressive drugs, initial encounter: Secondary | ICD-10-CM

## 2021-04-05 DIAGNOSIS — Z7982 Long term (current) use of aspirin: Secondary | ICD-10-CM | POA: Diagnosis not present

## 2021-04-05 DIAGNOSIS — D6481 Anemia due to antineoplastic chemotherapy: Secondary | ICD-10-CM

## 2021-04-05 LAB — CMP (CANCER CENTER ONLY)
ALT: 27 U/L (ref 0–44)
AST: 22 U/L (ref 15–41)
Albumin: 3.9 g/dL (ref 3.5–5.0)
Alkaline Phosphatase: 90 U/L (ref 38–126)
Anion gap: 7 (ref 5–15)
BUN: 13 mg/dL (ref 8–23)
CO2: 26 mmol/L (ref 22–32)
Calcium: 9.5 mg/dL (ref 8.9–10.3)
Chloride: 107 mmol/L (ref 98–111)
Creatinine: 0.75 mg/dL (ref 0.44–1.00)
GFR, Estimated: >60 mL/min (ref >60)
Glucose, Bld: 106 mg/dL — ABNORMAL HIGH (ref 70–99)
Potassium: 3.7 mmol/L (ref 3.5–5.1)
Sodium: 140 mmol/L (ref 135–145)
Total Bilirubin: 0.5 mg/dL (ref 0.3–1.2)
Total Protein: 6.4 g/dL — ABNORMAL LOW (ref 6.5–8.1)

## 2021-04-05 LAB — CBC WITH DIFFERENTIAL (CANCER CENTER ONLY)
Abs Immature Granulocytes: 0.02 10*3/uL (ref 0.00–0.07)
Basophils Absolute: 0.1 10*3/uL (ref 0.0–0.1)
Basophils Relative: 1 %
Eosinophils Absolute: 0.3 10*3/uL (ref 0.0–0.5)
Eosinophils Relative: 6 %
HCT: 33.5 % — ABNORMAL LOW (ref 36.0–46.0)
Hemoglobin: 10.8 g/dL — ABNORMAL LOW (ref 12.0–15.0)
Immature Granulocytes: 0 %
Lymphocytes Relative: 9 %
Lymphs Abs: 0.5 10*3/uL — ABNORMAL LOW (ref 0.7–4.0)
MCH: 30.3 pg (ref 26.0–34.0)
MCHC: 32.2 g/dL (ref 30.0–36.0)
MCV: 94.1 fL (ref 80.0–100.0)
Monocytes Absolute: 0.4 10*3/uL (ref 0.1–1.0)
Monocytes Relative: 8 %
Neutro Abs: 4 10*3/uL (ref 1.7–7.7)
Neutrophils Relative %: 76 %
Platelet Count: 232 10*3/uL (ref 150–400)
RBC: 3.56 MIL/uL — ABNORMAL LOW (ref 3.87–5.11)
RDW: 15.8 % — ABNORMAL HIGH (ref 11.5–15.5)
WBC Count: 5.3 10*3/uL (ref 4.0–10.5)
nRBC: 0 % (ref 0.0–0.2)

## 2021-04-05 LAB — LACTATE DEHYDROGENASE: LDH: 200 U/L — ABNORMAL HIGH (ref 98–192)

## 2021-04-05 MED ORDER — BORTEZOMIB CHEMO SQ INJECTION 3.5 MG (2.5MG/ML)
1.3000 mg/m2 | Freq: Once | INTRAMUSCULAR | Status: AC
  Administered 2021-04-05: 2.25 mg via SUBCUTANEOUS
  Filled 2021-04-05: qty 0.9

## 2021-04-05 NOTE — Progress Notes (Signed)
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 Concerning for multiple myeloma vs metastatic disease 12/31/2020: establish care with Dr. Lorenso Courier. UPEP showed marked Bence Jones protein, findings concerning for multiple myleoma 01/21/2021: bone marrow biopsy confirms multiple myeloma with atypical plasma cells representing 28% of all cells in the aspirate  02/01/2021: Cycle 1 Day 1 of VRd chemotherapy 02/22/2021: Cycle 2 Day 1 of VRd chemotherapy 03/15/2021: Cycle 3 Day 1 of VRd chemotherapy 04/05/2021: Cycle 4 Day 1 of VRd chemotherapy  Interval History:  Doris Wilkerson 75 y.o. female with medical history significant for lambda light chain multiple myeloma who presents for a follow up visit. The patient's last visit was on 03/21/2021 with Dr. Alvy Bimler in my abscence. In the interim since the last visit she continued on VRd chemotherapy.   On exam today Doris Wilkerson reports that she has been well in the interim since her last visit.  She was last seen by Dr. Alvy Bimler as I was out of the office.  She notes that she has been doing well with the chemotherapy but unfortunately had some side effects as result of the Zometa therapy.  She notes that she "felt like she had the flu".  And that she felt quite tired after that infusion.  She was experiencing some aches and pains which were unusual after that infusion.  She notes that her symptoms returned to normal after approximately 1 week's time.  She does endorse having some fatigue but fortunately denies any neuropathy, nausea, vomiting, or diarrhea.  She notes she is  also on restricting her diet and eating the foods that she enjoys without trying to restrict herself on "healthy food".   She denies any fevers, chills, sweats, nausea, vomiting or diarrhea. A full 10 point ROS is listed below.   MEDICAL HISTORY:  Past Medical History:  Diagnosis Date  . Asthma   . Cataract   . GERD (gastroesophageal reflux disease)   . Hyperlipidemia   . Hypertension   . Thyroid disease     SURGICAL HISTORY: Past Surgical History:  Procedure Laterality Date  . Cataract Surgery    . CORONARY ANGIOPLASTY WITH STENT PLACEMENT    . EYE SURGERY    . ORTHOPEDIC SURGERY  2012  . TONSILLECTOMY  1951    SOCIAL HISTORY: Social History   Socioeconomic History  . Marital status: Married    Spouse name: Not on file  . Number of children: 0  . Years of education: Not on file  . Highest education level: Not on file  Occupational History  . Occupation: unemployed  Tobacco Use  . Smoking status: Never Smoker  . Smokeless tobacco: Never Used  Vaping Use  . Vaping Use: Never used  Substance and Sexual Activity  . Alcohol use: Yes    Alcohol/week: 1.0 standard drink    Types: 1 Glasses of wine per week    Comment: occassionally  . Drug use: No  . Sexual activity: Not Currently  Other Topics Concern  . Not on file  Social History Narrative   Married. Education: college. Pt does exercise-   Social Determinants of Radio broadcast assistant  Strain: Not on file  Food Insecurity: Not on file  Transportation Needs: Not on file  Physical Activity: Not on file  Stress: Not on file  Social Connections: Not on file  Intimate Partner Violence: Not on file    FAMILY HISTORY: Family History  Problem Relation Age of Onset  . Cancer Father   . Heart disease Brother   . Dementia Brother   . Leukemia Brother   . Breast cancer Neg Hx   . Colon cancer Neg Hx   . Esophageal cancer Neg Hx   . Pancreatic cancer Neg Hx   . Rectal cancer Neg Hx   . Stomach cancer Neg  Hx     ALLERGIES:  is allergic to poison ivy extract, plavix [clopidogrel bisulfate], and other.  MEDICATIONS:  Current Outpatient Medications  Medication Sig Dispense Refill  . acetaminophen (TYLENOL 8 HOUR ARTHRITIS PAIN) 650 MG CR tablet Take 1 tablet (650 mg total) by mouth every 8 (eight) hours as needed for pain.    Marland Kitchen acyclovir (ZOVIRAX) 400 MG tablet Take 1 tablet (400 mg total) by mouth 2 (two) times daily. 60 tablet 5  . aspirin 81 MG tablet Take 81 mg by mouth daily.     Marland Kitchen atorvastatin (LIPITOR) 80 MG tablet Take 1 tablet (80 mg total) by mouth daily at 6 PM. 90 tablet 3  . benazepril (LOTENSIN) 10 MG tablet TAKE 1 TABLET(10 MG) BY MOUTH DAILY 90 tablet 3  . calcium carbonate (OS-CAL) 1250 (500 Ca) MG chewable tablet Chew 1 tablet by mouth daily.    . cholecalciferol (VITAMIN D3) 25 MCG (1000 UNIT) tablet Take 2,000 Units by mouth daily.    Marland Kitchen dexamethasone (DECADRON) 4 MG tablet Take 10 tablets (40 mg total) by mouth once a week. 40 tablet 3  . DM-APAP-CPM (CORICIDIN HBP PO) Take by mouth.     . famotidine-calcium carbonate-magnesium hydroxide (PEPCID COMPLETE) 10-800-165 MG chewable tablet Chew 1 tablet by mouth daily.    Marland Kitchen lenalidomide (REVLIMID) 25 MG capsule Take 1 capsule (25 mg total) by mouth daily. Take for 14 days; then none for 7 days. Repeat every 21 days [employer] Auth # [id number]  Date Obtained 03/25/21 14 capsule 0  . ondansetron (ZOFRAN) 8 MG tablet Take 1 tablet (8 mg total) by mouth every 8 (eight) hours as needed for nausea or vomiting. 30 tablet 0  . polyethylene glycol (MIRALAX / GLYCOLAX) 17 g packet Take 17 g by mouth daily.    . prochlorperazine (COMPAZINE) 10 MG tablet Take 1 tablet (10 mg total) by mouth every 6 (six) hours as needed for nausea or vomiting. 30 tablet 0   No current facility-administered medications for this visit.    REVIEW OF SYSTEMS:   Constitutional: ( - ) fevers, ( - )  chills , ( - ) night sweats Eyes: ( - ) blurriness of vision, ( - )  double vision, ( - ) watery eyes Ears, nose, mouth, throat, and face: ( - ) mucositis, ( - ) sore throat Respiratory: ( - ) cough, ( - ) dyspnea, ( - ) wheezes Cardiovascular: ( - ) palpitation, ( - ) chest discomfort, ( - ) lower extremity swelling Gastrointestinal:  ( - ) nausea, ( - ) heartburn, ( - ) change in bowel habits Skin: ( - ) abnormal skin rashes Lymphatics: ( - ) new lymphadenopathy, ( - ) easy bruising Neurological: ( - ) numbness, ( - ) tingling, ( - ) new weaknesses Behavioral/Psych: ( - )  mood change, ( - ) new changes  All other systems were reviewed with the patient and are negative.  PHYSICAL EXAMINATION: ECOG PERFORMANCE STATUS: 1 - Symptomatic but completely ambulatory  There were no vitals filed for this visit. There were no vitals filed for this visit.  GENERAL: well appearing elderly Caucasian female in NAD  SKIN: skin color, texture, turgor are normal, no rashes or significant lesions EYES: conjunctiva are pink and non-injected, sclera clear LUNGS: clear to auscultation and percussion with normal breathing effort HEART: regular rate & rhythm and no murmurs and no lower extremity edema Musculoskeletal: no cyanosis of digits and no clubbing  PSYCH: alert & oriented x 3, fluent speech NEURO: no focal motor/sensory deficits  LABORATORY DATA:  I have reviewed the data as listed CBC Latest Ref Rng & Units 04/05/2021 03/29/2021 03/21/2021  WBC 4.0 - 10.5 K/uL 5.3 7.1 5.1  Hemoglobin 12.0 - 15.0 g/dL 10.8(L) 10.6(L) 10.7(L)  Hematocrit 36.0 - 46.0 % 33.5(L) 31.6(L) 33.4(L)  Platelets 150 - 400 K/uL 232 225 191    CMP Latest Ref Rng & Units 04/05/2021 03/29/2021 03/21/2021  Glucose 70 - 99 mg/dL 106(H) 97 87  BUN 8 - 23 mg/dL _0 Creatinine 0.44 - 1.00 mg/dL 0.75 0.71 0.75  Sodium 135 - 145 mmol/L 140 138 140  Potassium 3.5 - 5.1 mmol/L 3.7 4.2 4.5  Chloride 98 - 111 mmol/L 107 108 105  CO2 22 - 32 mmol/L _1 Calcium 8.9 - 10.3 mg/dL 9.5 9.0 9.5   Total Protein 6.5 - 8.1 g/dL 6.4(L) 6.1(L) 6.2(L)  Total Bilirubin 0.3 - 1.2 mg/dL 0.5 0.8 0.7  Alkaline Phos 38 - 126 U/L 90 85 88  AST 15 - 41 U/L _2 ALT 0 - 44 U/L _3 Lab Results  Component Value Date   MPROTEIN Not Observed 03/29/2021   MPROTEIN 0.1 (H) 03/08/2021   MPROTEIN Not Observed 02/15/2021   Lab Results  Component Value Date   KPAFRELGTCHN 18.1 03/29/2021   KPAFRELGTCHN 16.3 03/01/2021   KPAFRELGTCHN 15.3 02/21/2021   LAMBDASER 94.4 (H) 03/29/2021   LAMBDASER 409.8 (H) 03/01/2021   LAMBDASER 543.8 (H) 02/21/2021   KAPLAMBRATIO 1.13 (L) 03/29/2021   KAPLAMBRATIO 0.19 (L) 03/29/2021   KAPLAMBRATIO 0.04 (L) 03/01/2021    RADIOGRAPHIC STUDIES: No results found.  ASSESSMENT & PLAN Doris Wilkerson 75 y.o. female with medical history significant for lambda light chain multiple myeloma who presents for a follow up visit.   After review the labs, review the records, schedule the patient the findings most consistent with a lambda light chain multiple myeloma.  This is getting confirmed with a bone marrow biopsy showing an abnormal plasma cell population of 28% and M protein/Bence-Jones proteins in the urine.  Given these findings the patient now carries a diagnosis of lambda light chain multiple myeloma.  R-ISS: Stage II (high risk genetics)  We will proceed with VRD chemotherapy.  This consists of bortezomib 1.3 mg per metered squared q week, lenalidomide 25 mg p.o. x14 days of 21-day cycle, and dexamethasone 40 mg p.o. q. Weekly.  This will be continued until he reached a VG PR at which time we can consider transition over to maintenance Revlimid 5 mg p.o. daily.  Doris Wilkerson is not a candidate for bone marrow transplant based on her advanced age.  # Lambda Light Chain Multiple Myeloma --diagnosis confirmed with bone marrow biopsy and urine protein analysis.   --  patient has good functional status and would be an excellent candidate for VRd chemotherapy. I  do not believe she would be a bone marrow transplant candidate based on her age.  --assure monthly restaging labs with SPEP, UPEP, and SFLC --patient declines port placement at this time --Cycle 1 Day 1 of therapy started on 02/01/2021.  --today is Cycle 4 Day 1 --excellent response noted within 1st cycle, normalization of urine free light chains and undetectable M protein.  --can consider transition to maintenance therapy once her SFLC have normalized. She is rapidly approaching that point.  --RTC in 2 weeks with continued weekly therapy  #Supportive Care --chemotherapy education complete  --patient declines port placement at this time --zofran 40m q8H PRN and compazine 171mPO q6H for nausea --acyclovir 4005mO BID for VCZ prophylaxis --zometa therapy performed on 03/29/2021. Repeat q 3 months.  -- no pain medication required at this time.   No orders of the defined types were placed in this encounter.  All questions were answered. The patient knows to call the clinic with any problems, questions or concerns.  A total of more than 30 minutes were spent on this encounter and over half of that time was spent on counseling and coordination of care as outlined above.   JohLedell PeoplesD Department of Hematology/Oncology [facility] Centerone: [phone]: [phone]: [email or web address]  04/06/2021 11:38 AM   Literature Support:  SuzBennett Scrapertezomib, lenalidomide, and dexamethasone in transplant-eligible newly diagnosed multiple myeloma patients: a multicenter retrospective comparative analysis. Int J HHinton Dyer020 Jan;111(1):103-111. doi: 10.1007/s12185-019-02764-1.  -- Overall response, very good partial response, and complete response rates after VRD were 96.4%, 45.5%, and 20.0%, respectively (median follow-up  period, 17.7 months). The 1-year progression-free survival (PFS) and overall survival rates were 95.8% and 98.2%, respectively. The response rate and PFS were similar between the groups, regardless of cytogenetic risk and age.

## 2021-04-06 ENCOUNTER — Encounter: Payer: Self-pay | Admitting: Hematology and Oncology

## 2021-04-08 LAB — KAPPA/LAMBDA LIGHT CHAINS
Kappa free light chain: 14.9 mg/L (ref 3.3–19.4)
Kappa, lambda light chain ratio: 0.2 — ABNORMAL LOW (ref 0.26–1.65)
Lambda free light chains: 74.6 mg/L — ABNORMAL HIGH (ref 5.7–26.3)

## 2021-04-09 ENCOUNTER — Telehealth: Payer: Self-pay | Admitting: Hematology and Oncology

## 2021-04-09 NOTE — Telephone Encounter (Signed)
Scheduled per los. Called and left msg. Mailed printout  °

## 2021-04-10 ENCOUNTER — Other Ambulatory Visit: Payer: Self-pay

## 2021-04-10 ENCOUNTER — Ambulatory Visit: Payer: Medicare Other

## 2021-04-10 DIAGNOSIS — Z9181 History of falling: Secondary | ICD-10-CM

## 2021-04-10 DIAGNOSIS — R262 Difficulty in walking, not elsewhere classified: Secondary | ICD-10-CM

## 2021-04-10 NOTE — Therapy (Signed)
[facility] [address] [place], [place], [address] Phone: [phone]   Fax:  [phone]  Physical Therapy Treatment  Patient Details  Name: Doris Wilkerson MRN: [id number] Date of Birth: 1946-04-10 Referring Provider (PT): Narda Rutherford, MD   Encounter Date: 04/10/2021   PT End of Session - 04/10/21 1441    Visit Number 3    Date for PT Re-Evaluation 05/24/21    Authorization Type Medicare    Progress Note Due on Visit 10    PT Start Time 1400    PT Stop Time 1441    PT Time Calculation (min) 41 min    Activity Tolerance Patient tolerated treatment well;No increased pain    Behavior During Therapy WFL for tasks assessed/performed           Past Medical History:  Diagnosis Date  . Asthma   . Cataract   . GERD (gastroesophageal reflux disease)   . Hyperlipidemia   . Hypertension   . Thyroid disease     Past Surgical History:  Procedure Laterality Date  . Cataract Surgery    . CORONARY ANGIOPLASTY WITH STENT PLACEMENT    . EYE SURGERY    . ORTHOPEDIC SURGERY  2012  . TONSILLECTOMY  1951    There were no vitals filed for this visit.   Subjective Assessment - 04/10/21 1400    Subjective I am having pain in my pelvis.  I want to try what I can without increasing pain,    Patient Stated Goals more mobile; decreased falls; get through remainder of chemotherapy (16 more weeks)    Currently in Pain? Yes                             [facility] Adult PT Treatment/Exercise - 04/10/21 0001      Neuro Re-ed    Neuro Re-ed Details  black foam; feet together x30s; modified tandem Rt/Lt x30s each.  Weight shifting 3 ways x 1 minute each      Exercises   Exercises Knee/Hip      Lumbar Exercises: Standing   Shoulder Extension Both;15 reps    Theraband Level (Shoulder Extension) Level 2 (Red)      Lumbar Exercises: Seated   Other Seated Lumbar Exercises ball squeeze; x10 x 5s hold       Knee/Hip Exercises: Aerobic   Nustep Level 1x 8 minutes- PT present to monitor for pain and fatigue      Knee/Hip Exercises: Standing   Lateral Step Up 2 sets;10 reps;Hand Hold: 2;Both;Step Height: 2"   using foam pad   Forward Step Up Both;2 sets;10 reps;Hand Hold: 2;Step Height: 2"   using foam pad     Knee/Hip Exercises: Seated   Sit to Sand 10 reps;without UE support;2 sets   regular height chair, verbal cues required for technique                   PT Short Term Goals - 04/10/21 1401      PT SHORT TERM GOAL #1   Title Patient will be independent with HEP for continued progression at home.    Status Achieved      PT SHORT TERM GOAL #2   Baseline --             PT Long Term Goals - 03/26/21 1558      PT LONG TERM GOAL #1   Title Patient  will improve FOTO score to 60 to indicate improved overall function.    Time 8    Period Weeks    Status New    Target Date 05/21/21      PT LONG TERM GOAL #2   Title Patient will be independent with advanced HEP for long term management of symptoms post D/C.    Time 8    Period Weeks    Status New    Target Date 05/21/21      PT LONG TERM GOAL #3   Title Patient will complete five time sit to stand in </= 13s to indicate decreased fall risk.    Baseline 18s    Time 8    Period Weeks    Status New    Target Date 05/21/21                 Plan - 04/10/21 1429    Clinical Impression Statement Pt continues to work on endurance and strength to address deconditioning secondary to chemo treatments.  Pt reports that she is active at home with walking and walking up steps.  Pt tolerated exercise well in the clinic including NuStep and advanced strength exercises.  PT provided stand by assistance and  verbal cues for safety and correct technique with exercises. Pt was able to perform sit to stand without elevated surface.  Pt fatigued with 2nd set although maintained good control.  Pt will continue to benefit from skilled  PT to address balance, LE strength and endurance.    PT Frequency 2x / week    PT Duration 8 weeks    PT Treatment/Interventions ADLs/Self Care Home Management;Aquatic Therapy;Gait training;Functional mobility training;Therapeutic activities;Therapeutic exercise;Balance training;Neuromuscular re-education;Patient/family education    PT Next Visit Plan continue dynamic and static balance; general total body strengthening and stabilization    PT Home Exercise Plan Access Code [email or web address]    Recommended Other Services initial cert is signed    Consulted and Agree with Plan of Care Patient           Patient will benefit from skilled therapeutic intervention in order to improve the following deficits and impairments:  Decreased activity tolerance,Abnormal gait,Difficulty walking,Pain  Visit Diagnosis: History of falling  Difficulty in walking, not elsewhere classified     Problem List Patient Active Problem List   Diagnosis Date Noted  . Physical debility 03/21/2021  . Dizziness 03/21/2021  . Vertigo 03/21/2021  . Anemia due to antineoplastic chemotherapy 03/21/2021  . Skin changes related to chemotherapy 03/21/2021  . Other constipation 03/21/2021  . Multiple myeloma not having achieved remission ([place]) 01/27/2021  . Hordeolum externum of left lower eyelid 06/28/2018  . CAD (coronary artery disease) 12/09/2011  . Dyslipidemia 12/09/2011  . Osteopenia 12/09/2011  . Hypothyroid 12/09/2011  . Asthma 12/09/2011    Sigurd Sos, PT 04/10/21 2:47 PM  [facility] [address], [place], [place], [address] Phone: [phone]   Fax:  [phone]  Name: Doris Wilkerson MRN: [id number] Date of Birth: 01/30/1946

## 2021-04-12 ENCOUNTER — Other Ambulatory Visit: Payer: Self-pay | Admitting: *Deleted

## 2021-04-12 ENCOUNTER — Other Ambulatory Visit: Payer: Self-pay

## 2021-04-12 ENCOUNTER — Inpatient Hospital Stay: Payer: Medicare Other

## 2021-04-12 VITALS — BP 127/68 | HR 74 | Temp 98.1°F | Resp 17 | Wt 153.2 lb

## 2021-04-12 DIAGNOSIS — C9000 Multiple myeloma not having achieved remission: Secondary | ICD-10-CM

## 2021-04-12 DIAGNOSIS — Z7982 Long term (current) use of aspirin: Secondary | ICD-10-CM | POA: Diagnosis not present

## 2021-04-12 DIAGNOSIS — Z5112 Encounter for antineoplastic immunotherapy: Secondary | ICD-10-CM | POA: Diagnosis not present

## 2021-04-12 DIAGNOSIS — Z79899 Other long term (current) drug therapy: Secondary | ICD-10-CM | POA: Diagnosis not present

## 2021-04-12 DIAGNOSIS — Z7952 Long term (current) use of systemic steroids: Secondary | ICD-10-CM | POA: Diagnosis not present

## 2021-04-12 LAB — CBC WITH DIFFERENTIAL (CANCER CENTER ONLY)
Abs Immature Granulocytes: 0.03 10*3/uL (ref 0.00–0.07)
Basophils Absolute: 0.1 10*3/uL (ref 0.0–0.1)
Basophils Relative: 1 %
Eosinophils Absolute: 0.5 10*3/uL (ref 0.0–0.5)
Eosinophils Relative: 6 %
HCT: 32.4 % — ABNORMAL LOW (ref 36.0–46.0)
Hemoglobin: 10.8 g/dL — ABNORMAL LOW (ref 12.0–15.0)
Immature Granulocytes: 0 %
Lymphocytes Relative: 6 %
Lymphs Abs: 0.5 10*3/uL — ABNORMAL LOW (ref 0.7–4.0)
MCH: 30.7 pg (ref 26.0–34.0)
MCHC: 33.3 g/dL (ref 30.0–36.0)
MCV: 92 fL (ref 80.0–100.0)
Monocytes Absolute: 0.4 10*3/uL (ref 0.1–1.0)
Monocytes Relative: 5 %
Neutro Abs: 6.6 10*3/uL (ref 1.7–7.7)
Neutrophils Relative %: 82 %
Platelet Count: 248 10*3/uL (ref 150–400)
RBC: 3.52 MIL/uL — ABNORMAL LOW (ref 3.87–5.11)
RDW: 15.9 % — ABNORMAL HIGH (ref 11.5–15.5)
WBC Count: 8 10*3/uL (ref 4.0–10.5)
nRBC: 0 % (ref 0.0–0.2)

## 2021-04-12 LAB — CMP (CANCER CENTER ONLY)
ALT: 23 U/L (ref 0–44)
AST: 19 U/L (ref 15–41)
Albumin: 3.9 g/dL (ref 3.5–5.0)
Alkaline Phosphatase: 86 U/L (ref 38–126)
Anion gap: 8 (ref 5–15)
BUN: 15 mg/dL (ref 8–23)
CO2: 25 mmol/L (ref 22–32)
Calcium: 9.4 mg/dL (ref 8.9–10.3)
Chloride: 104 mmol/L (ref 98–111)
Creatinine: 0.7 mg/dL (ref 0.44–1.00)
GFR, Estimated: >60 mL/min (ref >60)
Glucose, Bld: 83 mg/dL (ref 70–99)
Potassium: 4.2 mmol/L (ref 3.5–5.1)
Sodium: 137 mmol/L (ref 135–145)
Total Bilirubin: 0.7 mg/dL (ref 0.3–1.2)
Total Protein: 6.5 g/dL (ref 6.5–8.1)

## 2021-04-12 LAB — LACTATE DEHYDROGENASE: LDH: 203 U/L — ABNORMAL HIGH (ref 98–192)

## 2021-04-12 MED ORDER — LENALIDOMIDE 25 MG PO CAPS: 25.0000 mg | ORAL_CAPSULE | Freq: Every day | ORAL | 0 refills | Status: DC

## 2021-04-12 MED ORDER — BORTEZOMIB CHEMO SQ INJECTION 3.5 MG (2.5MG/ML)
1.3000 mg/m2 | Freq: Once | INTRAMUSCULAR | Status: AC
  Administered 2021-04-12: 2.25 mg via SUBCUTANEOUS
  Filled 2021-04-12: qty 0.9

## 2021-04-12 NOTE — Patient Instructions (Signed)
[facility]  Discharge Instructions: Thank you for choosing [facility] to provide your oncology and hematology care.   If you have a lab appointment with the Cancer Center, please go directly to the Cancer Center and check in at the registration area.   Wear comfortable clothing and clothing appropriate for easy access to any Portacath or PICC line.   We strive to give you quality time with your provider. You may need to reschedule your appointment if you arrive late (15 or more minutes).  Arriving late affects you and other patients whose appointments are after yours.  Also, if you miss three or more appointments without notifying the office, you may be dismissed from the clinic at the provider's discretion.      For prescription refill requests, have your pharmacy contact our office and allow 72 hours for refills to be completed.    Today you received the following chemotherapy and/or immunotherapy agents Velcade       To help prevent nausea and vomiting after your treatment, we encourage you to take your nausea medication as directed.  BELOW ARE SYMPTOMS THAT SHOULD BE REPORTED IMMEDIATELY: *FEVER GREATER THAN 100.4 F (38 C) OR HIGHER *CHILLS OR SWEATING *NAUSEA AND VOMITING THAT IS NOT CONTROLLED WITH YOUR NAUSEA MEDICATION *UNUSUAL SHORTNESS OF BREATH *UNUSUAL BRUISING OR BLEEDING *URINARY PROBLEMS (pain or burning when urinating, or frequent urination) *BOWEL PROBLEMS (unusual diarrhea, constipation, pain near the anus) TENDERNESS IN MOUTH AND THROAT WITH OR WITHOUT PRESENCE OF ULCERS (sore throat, sores in mouth, or a toothache) UNUSUAL RASH, SWELLING OR PAIN  UNUSUAL VAGINAL DISCHARGE OR ITCHING   Items with * indicate a potential emergency and should be followed up as soon as possible or go to the Emergency Department if any problems should occur.  Please show the CHEMOTHERAPY ALERT CARD or IMMUNOTHERAPY ALERT CARD at check-in to  the Emergency Department and triage nurse.  Should you have questions after your visit or need to cancel or reschedule your appointment, please contact [facility]  Dept: [phone]  and follow the prompts.  Office hours are 8:00 a.m. to 4:30 p.m. Monday - Friday. Please note that voicemails left after 4:00 p.m. may not be returned until the following business day.  We are closed weekends and major holidays. You have access to a nurse at all times for urgent questions. Please call the main number to the clinic Dept: [phone] and follow the prompts.   For any non-urgent questions, you may also contact your provider using MyChart. We now offer e-Visits for anyone 18 and older to request care online for non-urgent symptoms. For details visit [email or web address].   Also download the MyChart app! Go to the app store, search "MyChart", open the app, select [facility], and log in with your MyChart username and password.  Due to Covid, a mask is required upon entering the hospital/clinic. If you do not have a mask, one will be given to you upon arrival. For doctor visits, patients may have 1 support person aged 18 or older with them. For treatment visits, patients cannot have anyone with them due to current Covid guidelines and our immunocompromised population.   

## 2021-04-15 ENCOUNTER — Encounter: Payer: Medicare Other | Admitting: Physical Therapy

## 2021-04-17 ENCOUNTER — Other Ambulatory Visit: Payer: Self-pay

## 2021-04-17 ENCOUNTER — Ambulatory Visit: Payer: Medicare Other | Admitting: Physical Therapy

## 2021-04-17 DIAGNOSIS — R262 Difficulty in walking, not elsewhere classified: Secondary | ICD-10-CM | POA: Diagnosis not present

## 2021-04-17 DIAGNOSIS — Z9181 History of falling: Secondary | ICD-10-CM | POA: Diagnosis not present

## 2021-04-17 NOTE — Therapy (Signed)
[facility] [address] [place], [place], [address] Phone: [phone]   Fax:  [phone]  Physical Therapy Treatment  Patient Details  Name: Doris Wilkerson MRN: [id number] Date of Birth: Dec 29, 1945 Referring Provider (PT): Narda Rutherford, MD   Encounter Date: 04/17/2021   PT End of Session - 04/17/21 1531    Visit Number 4    Date for PT Re-Evaluation 05/24/21    Authorization Type Medicare    Progress Note Due on Visit 10    PT Start Time 1400    PT Stop Time 1962    PT Time Calculation (min) 39 min    Activity Tolerance Patient tolerated treatment well;No increased pain    Behavior During Therapy WFL for tasks assessed/performed           Past Medical History:  Diagnosis Date  . Asthma   . Cataract   . GERD (gastroesophageal reflux disease)   . Hyperlipidemia   . Hypertension   . Thyroid disease     Past Surgical History:  Procedure Laterality Date  . Cataract Surgery    . CORONARY ANGIOPLASTY WITH STENT PLACEMENT    . EYE SURGERY    . ORTHOPEDIC SURGERY  2012  . TONSILLECTOMY  1951    There were no vitals filed for this visit.   Subjective Assessment - 04/17/21 1405    Subjective I have some pain in the "sits" bone but none in the sacrum.    Pertinent History multiple myeloma (current)    Limitations Sitting;Walking;Standing    How long can you sit comfortably? 20 minutes with extra cushioning    How long can you stand comfortably? unlimited    How long can you walk comfortably? 15-20 minutes    Patient Stated Goals more mobile; decreased falls; get through remainder of chemotherapy (16 more weeks)    Currently in Pain? Yes    Pain Score 2     Pain Location Pelvis    Pain Orientation Left    Pain Descriptors / Indicators Discomfort    Pain Type Acute pain    Pain Onset More than a month ago                             [facility] Adult PT Treatment/Exercise - 04/17/21  0001      Neuro Re-ed    Neuro Re-ed Details  black foam, modified tandem, x60 seconds Lt/Rt      Lumbar Exercises: Standing   Shoulder Extension Both;Power Tower;10 reps    Shoulder Extension Limitations 20 lbs      Lumbar Exercises: Seated   Other Seated Lumbar Exercises seated on BOSU; UE pulses 2 x 20s for ab set    Other Seated Lumbar Exercises seated on BOSU; alternating UE/LE raises; 2 x 10 Lt/Rt Rt/Lt      Knee/Hip Exercises: Aerobic   Nustep Level 1 x 8  minutes; PT present throughout to discuss progress and assess response to activity      Knee/Hip Exercises: Seated   Ball Squeeze ball squeeze x 15 repetitions    Other Seated Knee/Hip Exercises hip abduction isos with belt at knees x 12 repetitions    Sit to Sand 2 sets;10 reps;without UE support   verbal cues for decreased hip adduction                   PT Short Term Goals - 04/17/21 1531  PT SHORT TERM GOAL #2   Title Patient will complete 6 minute walk test without use of AD with minimal gait impairments and no reports of increased bone pain.    Time 4    Period Weeks    Status On-going   Patient arriving this session without use of AD   Target Date 04/23/21             PT Long Term Goals - 03/26/21 1558      PT LONG TERM GOAL #1   Title Patient will improve FOTO score to 60 to indicate improved overall function.    Time 8    Period Weeks    Status New    Target Date 05/21/21      PT LONG TERM GOAL #2   Title Patient will be independent with advanced HEP for long term management of symptoms post D/C.    Time 8    Period Weeks    Status New    Target Date 05/21/21      PT LONG TERM GOAL #3   Title Patient will complete five time sit to stand in </= 13s to indicate decreased fall risk.    Baseline 18s    Time 8    Period Weeks    Status New    Target Date 05/21/21                 Plan - 04/17/21 1530    Clinical Impression Statement Patient demonstrates activity  tolerance limitations this date as she required increased seated recovery periods. Verbal cuing provided for decreased hip adduction when performing sit to stand indicating need for abductor strengthening. Patient progressing to step ups using 4-inch step which indicates improving LE strength. Would benefit from continued skilled intervention to address impairments for improved functional mobility and activity tolerance.    Personal Factors and Comorbidities Comorbidity 1    Comorbidities Cancer (current)    Examination-Activity Limitations Sit;Locomotion Level;Transfers    Examination-Participation Restrictions Community Activity;Cleaning    Rehab Potential Fair    PT Frequency 2x / week    PT Duration 8 weeks    PT Treatment/Interventions ADLs/Self Care Home Management;Aquatic Therapy;Gait training;Functional mobility training;Therapeutic activities;Therapeutic exercise;Balance training;Neuromuscular re-education;Patient/family education    PT Next Visit Plan progress dynamic and static balance to patient tolerance; general total body strengthening with functional training as tolerated    PT Home Exercise Plan Access Code [email or web address]    Consulted and Agree with Plan of Care Patient           Patient will benefit from skilled therapeutic intervention in order to improve the following deficits and impairments:  Decreased activity tolerance,Abnormal gait,Difficulty walking,Pain  Visit Diagnosis: History of falling  Difficulty in walking, not elsewhere classified     Problem List Patient Active Problem List   Diagnosis Date Noted  . Physical debility 03/21/2021  . Dizziness 03/21/2021  . Vertigo 03/21/2021  . Anemia due to antineoplastic chemotherapy 03/21/2021  . Skin changes related to chemotherapy 03/21/2021  . Other constipation 03/21/2021  . Multiple myeloma not having achieved remission ([place]) 01/27/2021  . Hordeolum externum of left lower eyelid 06/28/2018  . CAD (coronary  artery disease) 12/09/2011  . Dyslipidemia 12/09/2011  . Osteopenia 12/09/2011  . Hypothyroid 12/09/2011  . Asthma 12/09/2011    Everardo All PT, DPT  04/17/21 3:32 PM    [facility] [address], [place], [place], [address] Phone: [phone]   Fax:  [phone]  Name: Doris Wilkerson MRN: [id number] Date of Birth: 08-Jun-1946

## 2021-04-19 ENCOUNTER — Encounter: Payer: Self-pay | Admitting: Family Medicine

## 2021-04-19 ENCOUNTER — Ambulatory Visit: Payer: Medicare Other | Admitting: Physician Assistant

## 2021-04-19 ENCOUNTER — Encounter: Payer: Self-pay | Admitting: Hematology and Oncology

## 2021-04-19 ENCOUNTER — Inpatient Hospital Stay: Payer: Medicare Other

## 2021-04-19 ENCOUNTER — Other Ambulatory Visit: Payer: Self-pay

## 2021-04-19 ENCOUNTER — Inpatient Hospital Stay (HOSPITAL_BASED_OUTPATIENT_CLINIC_OR_DEPARTMENT_OTHER): Payer: Medicare Other | Admitting: Hematology and Oncology

## 2021-04-19 VITALS — BP 114/62 | HR 83 | Temp 97.7°F | Resp 18 | Ht 66.0 in | Wt 153.6 lb

## 2021-04-19 DIAGNOSIS — E039 Hypothyroidism, unspecified: Secondary | ICD-10-CM

## 2021-04-19 DIAGNOSIS — M899 Disorder of bone, unspecified: Secondary | ICD-10-CM | POA: Diagnosis not present

## 2021-04-19 DIAGNOSIS — D6481 Anemia due to antineoplastic chemotherapy: Secondary | ICD-10-CM

## 2021-04-19 DIAGNOSIS — I2510 Atherosclerotic heart disease of native coronary artery without angina pectoris: Secondary | ICD-10-CM

## 2021-04-19 DIAGNOSIS — C9000 Multiple myeloma not having achieved remission: Secondary | ICD-10-CM | POA: Diagnosis not present

## 2021-04-19 DIAGNOSIS — T451X5A Adverse effect of antineoplastic and immunosuppressive drugs, initial encounter: Secondary | ICD-10-CM

## 2021-04-19 DIAGNOSIS — Z79899 Other long term (current) drug therapy: Secondary | ICD-10-CM | POA: Diagnosis not present

## 2021-04-19 DIAGNOSIS — Z7952 Long term (current) use of systemic steroids: Secondary | ICD-10-CM | POA: Diagnosis not present

## 2021-04-19 DIAGNOSIS — Z5112 Encounter for antineoplastic immunotherapy: Secondary | ICD-10-CM | POA: Diagnosis not present

## 2021-04-19 DIAGNOSIS — Z7982 Long term (current) use of aspirin: Secondary | ICD-10-CM | POA: Diagnosis not present

## 2021-04-19 DIAGNOSIS — E782 Mixed hyperlipidemia: Secondary | ICD-10-CM

## 2021-04-19 DIAGNOSIS — M898X9 Other specified disorders of bone, unspecified site: Secondary | ICD-10-CM

## 2021-04-19 LAB — CMP (CANCER CENTER ONLY)
ALT: 18 U/L (ref 0–44)
AST: 16 U/L (ref 15–41)
Albumin: 3.7 g/dL (ref 3.5–5.0)
Alkaline Phosphatase: 88 U/L (ref 38–126)
Anion gap: 11 (ref 5–15)
BUN: 14 mg/dL (ref 8–23)
CO2: 22 mmol/L (ref 22–32)
Calcium: 9.1 mg/dL (ref 8.9–10.3)
Chloride: 107 mmol/L (ref 98–111)
Creatinine: 0.71 mg/dL (ref 0.44–1.00)
GFR, Estimated: >60 mL/min (ref >60)
Glucose, Bld: 87 mg/dL (ref 70–99)
Potassium: 3.9 mmol/L (ref 3.5–5.1)
Sodium: 140 mmol/L (ref 135–145)
Total Bilirubin: 0.6 mg/dL (ref 0.3–1.2)
Total Protein: 6.1 g/dL — ABNORMAL LOW (ref 6.5–8.1)

## 2021-04-19 LAB — CBC WITH DIFFERENTIAL (CANCER CENTER ONLY)
Abs Immature Granulocytes: 0.04 10*3/uL (ref 0.00–0.07)
Basophils Absolute: 0.1 10*3/uL (ref 0.0–0.1)
Basophils Relative: 1 %
Eosinophils Absolute: 1 10*3/uL — ABNORMAL HIGH (ref 0.0–0.5)
Eosinophils Relative: 13 %
HCT: 32.1 % — ABNORMAL LOW (ref 36.0–46.0)
Hemoglobin: 10.8 g/dL — ABNORMAL LOW (ref 12.0–15.0)
Immature Granulocytes: 1 %
Lymphocytes Relative: 10 %
Lymphs Abs: 0.8 10*3/uL (ref 0.7–4.0)
MCH: 30.5 pg (ref 26.0–34.0)
MCHC: 33.6 g/dL (ref 30.0–36.0)
MCV: 90.7 fL (ref 80.0–100.0)
Monocytes Absolute: 1.1 10*3/uL — ABNORMAL HIGH (ref 0.1–1.0)
Monocytes Relative: 15 %
Neutro Abs: 4.8 10*3/uL (ref 1.7–7.7)
Neutrophils Relative %: 60 %
Platelet Count: 258 10*3/uL (ref 150–400)
RBC: 3.54 MIL/uL — ABNORMAL LOW (ref 3.87–5.11)
RDW: 15.4 % (ref 11.5–15.5)
WBC Count: 7.8 10*3/uL (ref 4.0–10.5)
nRBC: 0 % (ref 0.0–0.2)

## 2021-04-19 LAB — LACTATE DEHYDROGENASE: LDH: 184 U/L (ref 98–192)

## 2021-04-19 MED ORDER — BORTEZOMIB CHEMO SQ INJECTION 3.5 MG (2.5MG/ML)
1.3000 mg/m2 | Freq: Once | INTRAMUSCULAR | Status: AC
  Administered 2021-04-19: 2.25 mg via SUBCUTANEOUS
  Filled 2021-04-19: qty 0.9

## 2021-04-19 NOTE — Progress Notes (Signed)
[place] Telephone:([phone]   [phone]  PROGRESS NOTE  Patient Care Team: [name], MD as PCP - General (Family Medicine) [name], MD as Consulting Physician (Cardiology) [name], MD (Ophthalmology)  Hematological/Oncological History # Lambda Light Chain Multiple Myeloma 12/26/2020: MRI of pelvis showed lytic lesions on L4, L5, the sacrum, with pathologic fracture of left inferior pubic ramus. Concerning for multiple myeloma vs metastatic disease 12/31/2020: establish care with Dr. Lorenso Courier. UPEP showed marked Bence Jones protein, findings concerning for multiple myleoma 01/21/2021: bone marrow biopsy confirms multiple myeloma with atypical plasma cells representing 28% of all cells in the aspirate  02/01/2021: Cycle 1 Day 1 of VRd chemotherapy 02/22/2021: Cycle 2 Day 1 of VRd chemotherapy 03/15/2021: Cycle 3 Day 1 of VRd chemotherapy 04/05/2021: Cycle 4 Day 1 of VRd chemotherapy  Interval History:  Doris Wilkerson 75 y.o. female with medical history significant for lambda light chain multiple myeloma who presents for a follow up visit. The patient's last visit was on 04/05/2021. In the interim since the last visit she continued on VRd chemotherapy.   On exam today Doris Wilkerson reports that she has been well in the interim since her last visit.  She has been working with physical therapy and has had improvement in her balance.  She notes that she is most fatigued on Monday after receiving chemotherapy on Friday.  She reports she is not having any nausea or vomiting but has been having some loose stools.  She notes that she "cannot get the dose of MiraLAX quite right".  She notes that she is ambulating well and otherwise has no questions concerns or complaints.  She denies any fevers, chills, sweats, nausea, vomiting or diarrhea. A full 10 point ROS is listed below.   MEDICAL HISTORY:  Past Medical History:  Diagnosis Date  . Asthma   . Cataract   .  GERD (gastroesophageal reflux disease)   . Hyperlipidemia   . Hypertension   . Thyroid disease     SURGICAL HISTORY: Past Surgical History:  Procedure Laterality Date  . Cataract Surgery    . CORONARY ANGIOPLASTY WITH STENT PLACEMENT    . EYE SURGERY    . ORTHOPEDIC SURGERY  2012  . TONSILLECTOMY  1951    SOCIAL HISTORY: Social History   Socioeconomic History  . Marital status: Married    Spouse name: Not on file  . Number of children: 0  . Years of education: Not on file  . Highest education level: Not on file  Occupational History  . Occupation: unemployed  Tobacco Use  . Smoking status: Never Smoker  . Smokeless tobacco: Never Used  Vaping Use  . Vaping Use: Never used  Substance and Sexual Activity  . Alcohol use: Yes    Alcohol/week: 1.0 standard drink    Types: 1 Glasses of wine per week    Comment: occassionally  . Drug use: No  . Sexual activity: Not Currently  Other Topics Concern  . Not on file  Social History Narrative   Married. Education: college. Pt does exercise-   Social Determinants of Health   Financial Resource Strain: Not on file  Food Insecurity: Not on file  Transportation Needs: Not on file  Physical Activity: Not on file  Stress: Not on file  Social Connections: Not on file  Intimate Partner Violence: Not on file    FAMILY HISTORY: Family History  Problem Relation Age of Onset  . Cancer Father   .  Heart disease Brother   . Dementia Brother   . Leukemia Brother   . Breast cancer Neg Hx   . Colon cancer Neg Hx   . Esophageal cancer Neg Hx   . Pancreatic cancer Neg Hx   . Rectal cancer Neg Hx   . Stomach cancer Neg Hx     ALLERGIES:  is allergic to poison ivy extract, plavix [clopidogrel bisulfate], and other.  MEDICATIONS:  Current Outpatient Medications  Medication Sig Dispense Refill  . acetaminophen (TYLENOL 8 HOUR ARTHRITIS PAIN) 650 MG CR tablet Take 1 tablet (650 mg total) by mouth every 8 (eight) hours as needed  for pain.    Marland Kitchen acyclovir (ZOVIRAX) 400 MG tablet Take 1 tablet (400 mg total) by mouth 2 (two) times daily. 60 tablet 5  . aspirin 81 MG tablet Take 81 mg by mouth daily.     Marland Kitchen atorvastatin (LIPITOR) 80 MG tablet Take 1 tablet (80 mg total) by mouth daily at 6 PM. 90 tablet 3  . benazepril (LOTENSIN) 10 MG tablet TAKE 1 TABLET(10 MG) BY MOUTH DAILY 90 tablet 3  . calcium carbonate (OS-CAL) 1250 (500 Ca) MG chewable tablet Chew 1 tablet by mouth daily.    . cholecalciferol (VITAMIN D3) 25 MCG (1000 UNIT) tablet Take 2,000 Units by mouth daily.    Marland Kitchen dexamethasone (DECADRON) 4 MG tablet Take 10 tablets (40 mg total) by mouth once a week. 40 tablet 3  . DM-APAP-CPM (CORICIDIN HBP PO) Take by mouth.     . famotidine-calcium carbonate-magnesium hydroxide (PEPCID COMPLETE) 10-800-165 MG chewable tablet Chew 1 tablet by mouth daily.    Marland Kitchen lenalidomide (REVLIMID) 25 MG capsule Take 1 capsule (25 mg total) by mouth daily. Take for 14 days; then none for 7 days. Repeat every 21 days [employer] Auth # [id number]  Date Obtained 04/12/21 14 capsule 0  . ondansetron (ZOFRAN) 8 MG tablet Take 1 tablet (8 mg total) by mouth every 8 (eight) hours as needed for nausea or vomiting. 30 tablet 0  . polyethylene glycol (MIRALAX / GLYCOLAX) 17 g packet Take 17 g by mouth daily.    . prochlorperazine (COMPAZINE) 10 MG tablet Take 1 tablet (10 mg total) by mouth every 6 (six) hours as needed for nausea or vomiting. 30 tablet 0   No current facility-administered medications for this visit.   Facility-Administered Medications Ordered in Other Visits  Medication Dose Route Frequency Provider Last Rate Last Admin  . bortezomib SQ (VELCADE) chemo injection (2.65m/mL concentration) 2.25 mg  1.3 mg/m2 (Treatment Plan Recorded) Subcutaneous Once DOrson Slick MD        REVIEW OF SYSTEMS:   Constitutional: ( - ) fevers, ( - )  chills , ( - ) night sweats Eyes: ( - ) blurriness of vision, ( - ) double vision, ( - ) watery  eyes Ears, nose, mouth, throat, and face: ( - ) mucositis, ( - ) sore throat Respiratory: ( - ) cough, ( - ) dyspnea, ( - ) wheezes Cardiovascular: ( - ) palpitation, ( - ) chest discomfort, ( - ) lower extremity swelling Gastrointestinal:  ( - ) nausea, ( - ) heartburn, ( - ) change in bowel habits Skin: ( - ) abnormal skin rashes Lymphatics: ( - ) new lymphadenopathy, ( - ) easy bruising Neurological: ( - ) numbness, ( - ) tingling, ( - ) new weaknesses Behavioral/Psych: ( - ) mood change, ( - ) new changes  All other systems were reviewed  with the patient and are negative.  PHYSICAL EXAMINATION: ECOG PERFORMANCE STATUS: 1 - Symptomatic but completely ambulatory  Vitals:   04/19/21 0847  BP: 114/62  Pulse: 83  Resp: 18  Temp: 97.7 F (36.5 C)  SpO2: 97%   Filed Weights   04/19/21 0847  Weight: 153 lb 9.6 oz (69.7 kg)    GENERAL: well appearing elderly Caucasian female in NAD  SKIN: skin color, texture, turgor are normal, no rashes or significant lesions EYES: conjunctiva are pink and non-injected, sclera clear LUNGS: clear to auscultation and percussion with normal breathing effort HEART: regular rate & rhythm and no murmurs and no lower extremity edema Musculoskeletal: no cyanosis of digits and no clubbing  PSYCH: alert & oriented x 3, fluent speech NEURO: no focal motor/sensory deficits  LABORATORY DATA:  I have reviewed the data as listed CBC Latest Ref Rng & Units 04/19/2021 04/12/2021 04/05/2021  WBC 4.0 - 10.5 K/uL 7.8 8.0 5.3  Hemoglobin 12.0 - 15.0 g/dL 10.8(L) 10.8(L) 10.8(L)  Hematocrit 36.0 - 46.0 % 32.1(L) 32.4(L) 33.5(L)  Platelets 150 - 400 K/uL 258 248 232    CMP Latest Ref Rng & Units 04/19/2021 04/12/2021 04/05/2021  Glucose 70 - 99 mg/dL 87 83 106(H)  BUN 8 - 23 mg/dL '14 15 13  ' Creatinine 0.44 - 1.00 mg/dL 0.71 0.70 0.75  Sodium 135 - 145 mmol/L 140 137 140  Potassium 3.5 - 5.1 mmol/L 3.9 4.2 3.7  Chloride 98 - 111 mmol/L 107 104 107  CO2 22 - 32  mmol/L '22 25 26  ' Calcium 8.9 - 10.3 mg/dL 9.1 9.4 9.5  Total Protein 6.5 - 8.1 g/dL 6.1(L) 6.5 6.4(L)  Total Bilirubin 0.3 - 1.2 mg/dL 0.6 0.7 0.5  Alkaline Phos 38 - 126 U/L 88 86 90  AST 15 - 41 U/L '16 19 22  ' ALT 0 - 44 U/L '18 23 27    ' Lab Results  Component Value Date   MPROTEIN Not Observed 03/29/2021   MPROTEIN 0.1 (H) 03/08/2021   MPROTEIN Not Observed 02/15/2021   Lab Results  Component Value Date   KPAFRELGTCHN 14.9 04/05/2021   KPAFRELGTCHN 18.1 03/29/2021   KPAFRELGTCHN 16.3 03/01/2021   LAMBDASER 74.6 (H) 04/05/2021   LAMBDASER 94.4 (H) 03/29/2021   LAMBDASER 409.8 (H) 03/01/2021   KAPLAMBRATIO 0.20 (L) 04/05/2021   KAPLAMBRATIO 1.13 (L) 03/29/2021   KAPLAMBRATIO 0.19 (L) 03/29/2021    RADIOGRAPHIC STUDIES: No results found.  ASSESSMENT & PLAN Doris Wilkerson 75 y.o. female with medical history significant for lambda light chain multiple myeloma who presents for a follow up visit.   After review the labs, review the records, schedule the patient the findings most consistent with a lambda light chain multiple myeloma.  This is getting confirmed with a bone marrow biopsy showing an abnormal plasma cell population of 28% and M protein/Bence-Jones proteins in the urine.  Given these findings the patient now carries a diagnosis of lambda light chain multiple myeloma.  R-ISS: Stage II (high risk genetics)  We will proceed with VRd chemotherapy.  This consists of bortezomib 1.3 mg per metered squared q week, lenalidomide 25 mg p.o. x14 days of 21-day cycle, and dexamethasone 40 mg p.o. q. Weekly.  This will be continued until he reached a VGPR at which time we can consider transition over to maintenance Revlimid 10 mg p.o. daily.  Doris Wilkerson is not a candidate for bone marrow transplant based on her advanced age.  # Lambda Light Chain Multiple Myeloma --diagnosis  confirmed with bone marrow biopsy and urine protein analysis.   --patient has good functional status and would  be an excellent candidate for VRd chemotherapy. I do not believe she would be a bone marrow transplant candidate based on her age.  --assure monthly restaging labs with SPEP, UPEP, and SFLC --patient declines port placement at this time --Cycle 1 Day 1 of therapy started on 02/01/2021.  --today is Cycle 4 Day 15 --excellent response noted within 1st cycle, normalization of urine free light chains and undetectable M protein.  --can consider transition to maintenance therapy once her SFLC have normalized. She is rapidly approaching that point.  --RTC in 2 weeks with continued weekly therapy  #Supportive Care --chemotherapy education complete  --patient declines port placement at this time --zofran 23m q8H PRN and compazine 155mPO q6H for nausea --acyclovir 40033mO BID for VCZ prophylaxis --zometa therapy performed on 03/29/2021. Repeat q 3 months.  -- no pain medication required at this time.   No orders of the defined types were placed in this encounter.  All questions were answered. The patient knows to call the clinic with any problems, questions or concerns.  A total of more than 30 minutes were spent on this encounter and over half of that time was spent on counseling and coordination of care as outlined above.   JohLedell PeoplesD Department of Hematology/Oncology [facility]: [phone]: [phone]: [email or web address]  04/19/2021 9:57 AM   Literature Support:  SuzBennett Scrapertezomib, lenalidomide, and dexamethasone in transplant-eligible newly diagnosed multiple myeloma patients: a multicenter retrospective comparative analysis. Int J HHinton Dyer020 Jan;111(1):103-111. doi: 10.1007/s12185-019-02764-1.  -- Overall response, very good partial response, and complete response rates after VRD were  96.4%, 45.5%, and 20.0%, respectively (median follow-up period, 17.7 months). The 1-year progression-free survival (PFS) and overall survival rates were 95.8% and 98.2%, respectively. The response rate and PFS were similar between the groups, regardless of cytogenetic risk and age.

## 2021-04-19 NOTE — Patient Instructions (Signed)
[facility]  Discharge Instructions: Thank you for choosing [facility] to provide your oncology and hematology care.   If you have a lab appointment with the Cancer Center, please go directly to the Cancer Center and check in at the registration area.   Wear comfortable clothing and clothing appropriate for easy access to any Portacath or PICC line.   We strive to give you quality time with your provider. You may need to reschedule your appointment if you arrive late (15 or more minutes).  Arriving late affects you and other patients whose appointments are after yours.  Also, if you miss three or more appointments without notifying the office, you may be dismissed from the clinic at the provider's discretion.      For prescription refill requests, have your pharmacy contact our office and allow 72 hours for refills to be completed.    Today you received the following chemotherapy and/or immunotherapy agents Velcade       To help prevent nausea and vomiting after your treatment, we encourage you to take your nausea medication as directed.  BELOW ARE SYMPTOMS THAT SHOULD BE REPORTED IMMEDIATELY: *FEVER GREATER THAN 100.4 F (38 C) OR HIGHER *CHILLS OR SWEATING *NAUSEA AND VOMITING THAT IS NOT CONTROLLED WITH YOUR NAUSEA MEDICATION *UNUSUAL SHORTNESS OF BREATH *UNUSUAL BRUISING OR BLEEDING *URINARY PROBLEMS (pain or burning when urinating, or frequent urination) *BOWEL PROBLEMS (unusual diarrhea, constipation, pain near the anus) TENDERNESS IN MOUTH AND THROAT WITH OR WITHOUT PRESENCE OF ULCERS (sore throat, sores in mouth, or a toothache) UNUSUAL RASH, SWELLING OR PAIN  UNUSUAL VAGINAL DISCHARGE OR ITCHING   Items with * indicate a potential emergency and should be followed up as soon as possible or go to the Emergency Department if any problems should occur.  Please show the CHEMOTHERAPY ALERT CARD or IMMUNOTHERAPY ALERT CARD at check-in to  the Emergency Department and triage nurse.  Should you have questions after your visit or need to cancel or reschedule your appointment, please contact [facility]  Dept: [phone]  and follow the prompts.  Office hours are 8:00 a.m. to 4:30 p.m. Monday - Friday. Please note that voicemails left after 4:00 p.m. may not be returned until the following business day.  We are closed weekends and major holidays. You have access to a nurse at all times for urgent questions. Please call the main number to the clinic Dept: [phone] and follow the prompts.   For any non-urgent questions, you may also contact your provider using MyChart. We now offer e-Visits for anyone 18 and older to request care online for non-urgent symptoms. For details visit [email or web address].   Also download the MyChart app! Go to the app store, search "MyChart", open the app, select [facility], and log in with your MyChart username and password.  Due to Covid, a mask is required upon entering the hospital/clinic. If you do not have a mask, one will be given to you upon arrival. For doctor visits, patients may have 1 support person aged 18 or older with them. For treatment visits, patients cannot have anyone with them due to current Covid guidelines and our immunocompromised population.   

## 2021-04-20 MED ORDER — LEVOTHYROXINE SODIUM 25 MCG PO TABS: 25.0000 ug | ORAL_TABLET | Freq: Every day | ORAL | 0 refills | Status: DC

## 2021-04-23 DIAGNOSIS — Z23 Encounter for immunization: Secondary | ICD-10-CM | POA: Diagnosis not present

## 2021-04-23 NOTE — Telephone Encounter (Signed)
FYI information from pt about treatment

## 2021-04-24 ENCOUNTER — Other Ambulatory Visit: Payer: Self-pay

## 2021-04-24 ENCOUNTER — Ambulatory Visit: Payer: Medicare Other | Attending: Hematology and Oncology

## 2021-04-24 DIAGNOSIS — R262 Difficulty in walking, not elsewhere classified: Secondary | ICD-10-CM | POA: Insufficient documentation

## 2021-04-24 DIAGNOSIS — Z9181 History of falling: Secondary | ICD-10-CM | POA: Insufficient documentation

## 2021-04-24 NOTE — Therapy (Signed)
[facility] [address], [place], [place], [address] Phone: [phone]   Fax:  [phone]  Physical Therapy Treatment  Patient Details  Name: Doris Wilkerson MRN: [id number] Date of Birth: 1946-05-28 Referring Provider (PT): Narda Rutherford, MD   Encounter Date: 04/24/2021   PT End of Session - 04/24/21 1314    Visit Number 5    Date for PT Re-Evaluation 05/24/21    Authorization Type Medicare    Progress Note Due on Visit 10    PT Start Time 1234    PT Stop Time 4497    PT Time Calculation (min) 43 min    Activity Tolerance Patient tolerated treatment well;No increased pain    Behavior During Therapy WFL for tasks assessed/performed           Past Medical History:  Diagnosis Date  . Asthma   . Cataract   . GERD (gastroesophageal reflux disease)   . Hyperlipidemia   . Hypertension   . Thyroid disease     Past Surgical History:  Procedure Laterality Date  . Cataract Surgery    . CORONARY ANGIOPLASTY WITH STENT PLACEMENT    . EYE SURGERY    . ORTHOPEDIC SURGERY  2012  . TONSILLECTOMY  1951    There were no vitals filed for this visit.   Subjective Assessment - 04/24/21 1236    Subjective I am improving with exercise.  My balance is not where it should be.    Currently in Pain? No/denies                             [facility] Adult PT Treatment/Exercise - 04/24/21 0001      Lumbar Exercises: Standing   Row Strengthening;Both;Theraband;20 reps    Theraband Level (Row) Level 4 (Blue)      Lumbar Exercises: Seated   Other Seated Lumbar Exercises seated shoulder flexion 1# 2x10      Knee/Hip Exercises: Aerobic   Nustep Level 2 x 8  minutes; PT present throughout to discuss progress and assess response to activity      Knee/Hip Exercises: Standing   Lateral Step Up 2 sets;10 reps;Hand Hold: 2;Both;Step Height: 2"   using foam pad   Forward Step Up Both;2 sets;10 reps;Hand Hold:  2;Step Height: 2"   using foam pad   Rocker Board 3 minutes      Knee/Hip Exercises: Seated   Sit to Sand 2 sets;10 reps;without UE support   verbal cues for decreased hip adduction                   PT Short Term Goals - 04/24/21 1237      PT SHORT TERM GOAL #1   Title Patient will be independent with HEP for continued progression at home.    Status Achieved      PT SHORT TERM GOAL #2   Title Patient will complete 6 minute walk test without use of AD with minimal gait impairments and no reports of increased bone pain.             PT Long Term Goals - 03/26/21 1558      PT LONG TERM GOAL #1   Title Patient will improve FOTO score to 60 to indicate improved overall function.    Time 8    Period Weeks    Status New    Target Date 05/21/21  PT LONG TERM GOAL #2   Title Patient will be independent with advanced HEP for long term management of symptoms post D/C.    Time 8    Period Weeks    Status New    Target Date 05/21/21      PT LONG TERM GOAL #3   Title Patient will complete five time sit to stand in </= 13s to indicate decreased fall risk.    Baseline 18s    Time 8    Period Weeks    Status New    Target Date 05/21/21                 Plan - 04/24/21 1323    Clinical Impression Statement Pt demonstrated improved endurance today with exercise in the clinic.  Pt required very short rest breaks between activities and PT alternated between UE and LE exercises.  Pt has been exercising regularly at home and feels like endurance is improving overall.  Pt will continue to benefit from skilled PT to address endurance and balance deficits associated with chemo treatment.    PT Frequency 2x / week    PT Duration 8 weeks    PT Treatment/Interventions ADLs/Self Care Home Management;Aquatic Therapy;Gait training;Functional mobility training;Therapeutic activities;Therapeutic exercise;Balance training;Neuromuscular re-education;Patient/family education    PT  Next Visit Plan progress dynamic and static balance to patient tolerance; general total body strengthening with functional training as tolerated    PT Home Exercise Plan Access Code [email or web address]    Consulted and Agree with Plan of Care Patient           Patient will benefit from skilled therapeutic intervention in order to improve the following deficits and impairments:  Decreased activity tolerance,Abnormal gait,Difficulty walking,Pain  Visit Diagnosis: Difficulty in walking, not elsewhere classified  History of falling     Problem List Patient Active Problem List   Diagnosis Date Noted  . Physical debility 03/21/2021  . Dizziness 03/21/2021  . Vertigo 03/21/2021  . Anemia due to antineoplastic chemotherapy 03/21/2021  . Skin changes related to chemotherapy 03/21/2021  . Other constipation 03/21/2021  . Multiple myeloma not having achieved remission ([place]) 01/27/2021  . Hordeolum externum of left lower eyelid 06/28/2018  . CAD (coronary artery disease) 12/09/2011  . Dyslipidemia 12/09/2011  . Osteopenia 12/09/2011  . Hypothyroid 12/09/2011  . Asthma 12/09/2011     Doris Wilkerson, PT 04/24/21 1:24 PM  [facility] [address] [place], [place], [address] Phone: [phone]   Fax:  [phone]  Name: Doris Wilkerson MRN: [id number] Date of Birth: 12-14-1945

## 2021-04-26 ENCOUNTER — Inpatient Hospital Stay: Payer: Medicare Other

## 2021-04-26 ENCOUNTER — Other Ambulatory Visit: Payer: Self-pay

## 2021-04-26 ENCOUNTER — Other Ambulatory Visit: Payer: Self-pay | Admitting: Hematology and Oncology

## 2021-04-26 ENCOUNTER — Inpatient Hospital Stay: Payer: Medicare Other | Attending: Physician Assistant

## 2021-04-26 ENCOUNTER — Other Ambulatory Visit: Payer: Self-pay | Admitting: Family Medicine

## 2021-04-26 VITALS — BP 136/68 | HR 71 | Temp 98.4°F | Resp 17 | Wt 154.2 lb

## 2021-04-26 DIAGNOSIS — Z5112 Encounter for antineoplastic immunotherapy: Secondary | ICD-10-CM | POA: Insufficient documentation

## 2021-04-26 DIAGNOSIS — C9000 Multiple myeloma not having achieved remission: Secondary | ICD-10-CM

## 2021-04-26 DIAGNOSIS — E039 Hypothyroidism, unspecified: Secondary | ICD-10-CM | POA: Diagnosis not present

## 2021-04-26 LAB — CBC WITH DIFFERENTIAL (CANCER CENTER ONLY)
Abs Immature Granulocytes: 0.02 10*3/uL (ref 0.00–0.07)
Basophils Absolute: 0.1 10*3/uL (ref 0.0–0.1)
Basophils Relative: 1 %
Eosinophils Absolute: 0.3 10*3/uL (ref 0.0–0.5)
Eosinophils Relative: 5 %
HCT: 33 % — ABNORMAL LOW (ref 36.0–46.0)
Hemoglobin: 10.7 g/dL — ABNORMAL LOW (ref 12.0–15.0)
Immature Granulocytes: 0 %
Lymphocytes Relative: 5 %
Lymphs Abs: 0.4 10*3/uL — ABNORMAL LOW (ref 0.7–4.0)
MCH: 30.3 pg (ref 26.0–34.0)
MCHC: 32.4 g/dL (ref 30.0–36.0)
MCV: 93.5 fL (ref 80.0–100.0)
Monocytes Absolute: 0.7 10*3/uL (ref 0.1–1.0)
Monocytes Relative: 10 %
Neutro Abs: 5.6 10*3/uL (ref 1.7–7.7)
Neutrophils Relative %: 79 %
Platelet Count: 234 10*3/uL (ref 150–400)
RBC: 3.53 MIL/uL — ABNORMAL LOW (ref 3.87–5.11)
RDW: 15.2 % (ref 11.5–15.5)
WBC Count: 7.1 10*3/uL (ref 4.0–10.5)
nRBC: 0 % (ref 0.0–0.2)

## 2021-04-26 LAB — CMP (CANCER CENTER ONLY)
ALT: 23 U/L (ref 0–44)
AST: 19 U/L (ref 15–41)
Albumin: 3.8 g/dL (ref 3.5–5.0)
Alkaline Phosphatase: 91 U/L (ref 38–126)
Anion gap: 8 (ref 5–15)
BUN: 15 mg/dL (ref 8–23)
CO2: 22 mmol/L (ref 22–32)
Calcium: 9.7 mg/dL (ref 8.9–10.3)
Chloride: 107 mmol/L (ref 98–111)
Creatinine: 0.72 mg/dL (ref 0.44–1.00)
GFR, Estimated: >60 mL/min (ref >60)
Glucose, Bld: 92 mg/dL (ref 70–99)
Potassium: 4.3 mmol/L (ref 3.5–5.1)
Sodium: 137 mmol/L (ref 135–145)
Total Bilirubin: 0.5 mg/dL (ref 0.3–1.2)
Total Protein: 6.3 g/dL — ABNORMAL LOW (ref 6.5–8.1)

## 2021-04-26 LAB — LACTATE DEHYDROGENASE: LDH: 203 U/L — ABNORMAL HIGH (ref 98–192)

## 2021-04-26 MED ORDER — BORTEZOMIB CHEMO SQ INJECTION 3.5 MG (2.5MG/ML)
1.3000 mg/m2 | Freq: Once | INTRAMUSCULAR | Status: AC
  Administered 2021-04-26: 2.25 mg via SUBCUTANEOUS
  Filled 2021-04-26: qty 0.9

## 2021-04-26 NOTE — Patient Instructions (Signed)
[facility]  Discharge Instructions: Thank you for choosing [facility] to provide your oncology and hematology care.   If you have a lab appointment with the Cancer Center, please go directly to the Cancer Center and check in at the registration area.   Wear comfortable clothing and clothing appropriate for easy access to any Portacath or PICC line.   We strive to give you quality time with your provider. You may need to reschedule your appointment if you arrive late (15 or more minutes).  Arriving late affects you and other patients whose appointments are after yours.  Also, if you miss three or more appointments without notifying the office, you may be dismissed from the clinic at the provider's discretion.      For prescription refill requests, have your pharmacy contact our office and allow 72 hours for refills to be completed.    Today you received the following chemotherapy and/or immunotherapy agents Velcade       To help prevent nausea and vomiting after your treatment, we encourage you to take your nausea medication as directed.  BELOW ARE SYMPTOMS THAT SHOULD BE REPORTED IMMEDIATELY: *FEVER GREATER THAN 100.4 F (38 C) OR HIGHER *CHILLS OR SWEATING *NAUSEA AND VOMITING THAT IS NOT CONTROLLED WITH YOUR NAUSEA MEDICATION *UNUSUAL SHORTNESS OF BREATH *UNUSUAL BRUISING OR BLEEDING *URINARY PROBLEMS (pain or burning when urinating, or frequent urination) *BOWEL PROBLEMS (unusual diarrhea, constipation, pain near the anus) TENDERNESS IN MOUTH AND THROAT WITH OR WITHOUT PRESENCE OF ULCERS (sore throat, sores in mouth, or a toothache) UNUSUAL RASH, SWELLING OR PAIN  UNUSUAL VAGINAL DISCHARGE OR ITCHING   Items with * indicate a potential emergency and should be followed up as soon as possible or go to the Emergency Department if any problems should occur.  Please show the CHEMOTHERAPY ALERT CARD or IMMUNOTHERAPY ALERT CARD at check-in to  the Emergency Department and triage nurse.  Should you have questions after your visit or need to cancel or reschedule your appointment, please contact  [facility]  Dept: [phone]  and follow the prompts.  Office hours are 8:00 a.m. to 4:30 p.m. Monday - Friday. Please note that voicemails left after 4:00 p.m. may not be returned until the following business day.  We are closed weekends and major holidays. You have access to a nurse at all times for urgent questions. Please call the main number to the clinic Dept: [phone] and follow the prompts.   For any non-urgent questions, you may also contact your provider using MyChart. We now offer e-Visits for anyone 18 and older to request care online for non-urgent symptoms. For details visit [email or web address].   Also download the MyChart app! Go to the app store, search "MyChart", open the app, select , and log in with your MyChart username and password.  Due to Covid, a mask is required upon entering the hospital/clinic. If you do not have a mask, one will be given to you upon arrival. For doctor visits, patients may have 1 support person aged 18 or older with them. For treatment visits, patients cannot have anyone with them due to current Covid guidelines and our immunocompromised population.   

## 2021-04-29 LAB — TSH: TSH: 0.735 u[IU]/mL (ref 0.450–4.500)

## 2021-04-30 NOTE — Addendum Note (Signed)
Addended by: [name] R on: 04/30/2021 11:04 AM   Modules accepted: Orders

## 2021-04-30 NOTE — Telephone Encounter (Signed)
Pt is having lab work done 05/03/21 and is asking if you would like a lipid panel done, Last lipid panel 11/30/19   Pt appt 05/22/21

## 2021-04-30 NOTE — Telephone Encounter (Signed)
Done

## 2021-05-01 ENCOUNTER — Other Ambulatory Visit: Payer: Self-pay | Admitting: *Deleted

## 2021-05-01 ENCOUNTER — Ambulatory Visit: Payer: Medicare Other | Admitting: Physical Therapy

## 2021-05-01 ENCOUNTER — Other Ambulatory Visit: Payer: Self-pay

## 2021-05-01 DIAGNOSIS — C9000 Multiple myeloma not having achieved remission: Secondary | ICD-10-CM

## 2021-05-01 DIAGNOSIS — R262 Difficulty in walking, not elsewhere classified: Secondary | ICD-10-CM

## 2021-05-01 DIAGNOSIS — Z9181 History of falling: Secondary | ICD-10-CM

## 2021-05-01 MED ORDER — LENALIDOMIDE 25 MG PO CAPS: 25.0000 mg | ORAL_CAPSULE | Freq: Every day | ORAL | 0 refills | Status: DC

## 2021-05-01 NOTE — Therapy (Signed)
[facility] [address], [place], [place], [address] Phone: [phone]   Fax:  [phone]  Physical Therapy Treatment  Patient Details  Name: Doris Wilkerson MRN: [id number] Date of Birth: 1946-03-31 Referring Provider (PT): Narda Rutherford, MD   Encounter Date: 05/01/2021   PT End of Session - 05/01/21 1531    Visit Number 6    Date for PT Re-Evaluation 05/24/21    Authorization Type Medicare    Authorization Time Period KX at 15 visits    Progress Note Due on Visit 10    PT Start Time 1401    PT Stop Time 1440    PT Time Calculation (min) 39 min    Activity Tolerance Patient tolerated treatment well;No increased pain    Behavior During Therapy WFL for tasks assessed/performed           Past Medical History:  Diagnosis Date  . Asthma   . Cataract   . GERD (gastroesophageal reflux disease)   . Hyperlipidemia   . Hypertension   . Thyroid disease     Past Surgical History:  Procedure Laterality Date  . Cataract Surgery    . CORONARY ANGIOPLASTY WITH STENT PLACEMENT    . EYE SURGERY    . ORTHOPEDIC SURGERY  2012  . TONSILLECTOMY  1951    There were no vitals filed for this visit.   Subjective Assessment - 05/01/21 1528    Subjective Had a lot of fatigue Monday due to Friday chemo session. Would like to work on balance.    Pertinent History multiple myeloma (current)    Limitations Sitting;Walking;Standing    How long can you sit comfortably? 20 minutes with extra cushioning    How long can you stand comfortably? unlimited    How long can you walk comfortably? 15-20 minutes    Patient Stated Goals more mobile; decreased falls; get through remainder of chemotherapy (16 more weeks)    Currently in Pain? No/denies                             [facility] Adult PT Treatment/Exercise - 05/01/21 0001      Neuro Re-ed    Neuro Re-ed Details  dot taps forward/latera/back and fwd 45 degrees;  box step      Lumbar Exercises: Standing   Row Strengthening;Power tower;Both;10 reps    Row Limitations 20lbs    Shoulder Extension Strengthening;Power Tower;10 reps    Shoulder Extension Limitations 15lbs    Other Standing Lumbar Exercises resisted walk; 20lbs; 4 directions x 3      Lumbar Exercises: Seated   Other Seated Lumbar Exercises seated shoulder flexion 1# 2x10   seated on gray disc   Other Seated Lumbar Exercises seated on gray disc;      Knee/Hip Exercises: Aerobic   Nustep Level 2 x 8 minutes; PT present to discuss progress      Knee/Hip Exercises: Standing   Lateral Step Up 2 sets;10 reps;Hand Hold: 2;Both;Step Height: 4"    Forward Step Up Both;2 sets;10 reps;Hand Hold: 2;Step Height: 4"      Knee/Hip Exercises: Seated   Sit to Sand 10 reps;without UE support   green tband at waist for resistance                   PT Short Term Goals - 04/24/21 1237      PT SHORT TERM GOAL #1  Title Patient will be independent with HEP for continued progression at home.    Status Achieved      PT SHORT TERM GOAL #2   Title Patient will complete 6 minute walk test without use of AD with minimal gait impairments and no reports of increased bone pain.             PT Long Term Goals - 03/26/21 1558      PT LONG TERM GOAL #1   Title Patient will improve FOTO score to 60 to indicate improved overall function.    Time 8    Period Weeks    Status New    Target Date 05/21/21      PT LONG TERM GOAL #2   Title Patient will be independent with advanced HEP for long term management of symptoms post D/C.    Time 8    Period Weeks    Status New    Target Date 05/21/21      PT LONG TERM GOAL #3   Title Patient will complete five time sit to stand in </= 13s to indicate decreased fall risk.    Baseline 18s    Time 8    Period Weeks    Status New    Target Date 05/21/21                 Plan - 05/01/21 1529    Clinical Impression Statement Patient  requiring intermittent CGA when performing resisted walking activity. Verbal cues for decreased excessive posterior pelvic tilt during seated core exercises with UE movement. Would benefit from continued skilled intervention to address impairments for improved balance and functional mobility.    Personal Factors and Comorbidities Comorbidity 1    Comorbidities Cancer (current)    Examination-Activity Limitations Sit;Locomotion Level;Transfers    Examination-Participation Restrictions Community Activity;Cleaning    Rehab Potential Fair    PT Frequency 2x / week    PT Duration 8 weeks    PT Treatment/Interventions ADLs/Self Care Home Management;Aquatic Therapy;Gait training;Functional mobility training;Therapeutic activities;Therapeutic exercise;Balance training;Neuromuscular re-education;Patient/family education    PT Next Visit Plan continue static and dynamic balance as well as global strengthening adding functional component when appropriate    PT Home Exercise Plan Access Code [email or web address]    Consulted and Agree with Plan of Care Patient           Patient will benefit from skilled therapeutic intervention in order to improve the following deficits and impairments:  Decreased activity tolerance,Abnormal gait,Difficulty walking,Pain  Visit Diagnosis: Difficulty in walking, not elsewhere classified  History of falling     Problem List Patient Active Problem List   Diagnosis Date Noted  . Physical debility 03/21/2021  . Dizziness 03/21/2021  . Vertigo 03/21/2021  . Anemia due to antineoplastic chemotherapy 03/21/2021  . Skin changes related to chemotherapy 03/21/2021  . Other constipation 03/21/2021  . Multiple myeloma not having achieved remission ([place]) 01/27/2021  . Hordeolum externum of left lower eyelid 06/28/2018  . CAD (coronary artery disease) 12/09/2011  . Dyslipidemia 12/09/2011  . Osteopenia 12/09/2011  . Hypothyroid 12/09/2011  . Asthma 12/09/2011    Everardo All  PT, DPT  05/01/21 3:33 PM   [facility] [address], [place], [place], [address] Phone: [phone]   Fax:  [phone]  Name: Estellar Cadena MRN: [id number] Date of Birth: 1946/07/12

## 2021-05-03 ENCOUNTER — Inpatient Hospital Stay (HOSPITAL_BASED_OUTPATIENT_CLINIC_OR_DEPARTMENT_OTHER): Payer: Medicare Other | Admitting: Hematology and Oncology

## 2021-05-03 ENCOUNTER — Other Ambulatory Visit: Payer: Self-pay

## 2021-05-03 ENCOUNTER — Inpatient Hospital Stay: Payer: Medicare Other

## 2021-05-03 ENCOUNTER — Other Ambulatory Visit: Payer: Self-pay | Admitting: Family Medicine

## 2021-05-03 VITALS — BP 127/63 | HR 64 | Temp 98.2°F | Resp 17 | Wt 152.5 lb

## 2021-05-03 DIAGNOSIS — T451X5A Adverse effect of antineoplastic and immunosuppressive drugs, initial encounter: Secondary | ICD-10-CM | POA: Diagnosis not present

## 2021-05-03 DIAGNOSIS — E782 Mixed hyperlipidemia: Secondary | ICD-10-CM | POA: Diagnosis not present

## 2021-05-03 DIAGNOSIS — M899 Disorder of bone, unspecified: Secondary | ICD-10-CM | POA: Diagnosis not present

## 2021-05-03 DIAGNOSIS — C9000 Multiple myeloma not having achieved remission: Secondary | ICD-10-CM

## 2021-05-03 DIAGNOSIS — D6481 Anemia due to antineoplastic chemotherapy: Secondary | ICD-10-CM | POA: Diagnosis not present

## 2021-05-03 DIAGNOSIS — Z5112 Encounter for antineoplastic immunotherapy: Secondary | ICD-10-CM | POA: Diagnosis not present

## 2021-05-03 DIAGNOSIS — I2510 Atherosclerotic heart disease of native coronary artery without angina pectoris: Secondary | ICD-10-CM | POA: Diagnosis not present

## 2021-05-03 DIAGNOSIS — R5381 Other malaise: Secondary | ICD-10-CM | POA: Diagnosis not present

## 2021-05-03 LAB — CMP (CANCER CENTER ONLY)
ALT: 23 U/L (ref 0–44)
AST: 21 U/L (ref 15–41)
Albumin: 3.9 g/dL (ref 3.5–5.0)
Alkaline Phosphatase: 75 U/L (ref 38–126)
Anion gap: 8 (ref 5–15)
BUN: 9 mg/dL (ref 8–23)
CO2: 26 mmol/L (ref 22–32)
Calcium: 9.2 mg/dL (ref 8.9–10.3)
Chloride: 106 mmol/L (ref 98–111)
Creatinine: 0.75 mg/dL (ref 0.44–1.00)
GFR, Estimated: >60 mL/min (ref >60)
Glucose, Bld: 88 mg/dL (ref 70–99)
Potassium: 4.1 mmol/L (ref 3.5–5.1)
Sodium: 140 mmol/L (ref 135–145)
Total Bilirubin: 0.7 mg/dL (ref 0.3–1.2)
Total Protein: 6.4 g/dL — ABNORMAL LOW (ref 6.5–8.1)

## 2021-05-03 LAB — CBC WITH DIFFERENTIAL (CANCER CENTER ONLY)
Abs Immature Granulocytes: 0.01 10*3/uL (ref 0.00–0.07)
Basophils Absolute: 0.1 10*3/uL (ref 0.0–0.1)
Basophils Relative: 1 %
Eosinophils Absolute: 1.1 10*3/uL — ABNORMAL HIGH (ref 0.0–0.5)
Eosinophils Relative: 18 %
HCT: 34.1 % — ABNORMAL LOW (ref 36.0–46.0)
Hemoglobin: 11.1 g/dL — ABNORMAL LOW (ref 12.0–15.0)
Immature Granulocytes: 0 %
Lymphocytes Relative: 14 %
Lymphs Abs: 0.8 10*3/uL (ref 0.7–4.0)
MCH: 30.2 pg (ref 26.0–34.0)
MCHC: 32.6 g/dL (ref 30.0–36.0)
MCV: 92.7 fL (ref 80.0–100.0)
Monocytes Absolute: 0.5 10*3/uL (ref 0.1–1.0)
Monocytes Relative: 9 %
Neutro Abs: 3.4 10*3/uL (ref 1.7–7.7)
Neutrophils Relative %: 58 %
Platelet Count: 217 10*3/uL (ref 150–400)
RBC: 3.68 MIL/uL — ABNORMAL LOW (ref 3.87–5.11)
RDW: 15.2 % (ref 11.5–15.5)
WBC Count: 5.9 10*3/uL (ref 4.0–10.5)
nRBC: 0 % (ref 0.0–0.2)

## 2021-05-03 LAB — LACTATE DEHYDROGENASE: LDH: 171 U/L (ref 98–192)

## 2021-05-03 MED ORDER — BORTEZOMIB CHEMO SQ INJECTION 3.5 MG (2.5MG/ML)
1.3000 mg/m2 | Freq: Once | INTRAMUSCULAR | Status: AC
Start: 2021-05-03 — End: 2021-05-03
  Administered 2021-05-03: 2.25 mg via SUBCUTANEOUS
  Filled 2021-05-03: qty 0.9

## 2021-05-03 NOTE — Progress Notes (Signed)
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 Concerning for multiple myeloma vs metastatic disease 12/31/2020: establish care with Dr. Lorenso Courier. UPEP showed marked Bence Jones protein, findings concerning for multiple myleoma 01/21/2021: bone marrow biopsy confirms multiple myeloma with atypical plasma cells representing 28% of all cells in the aspirate  02/01/2021: Cycle 1 Day 1 of VRd chemotherapy 02/22/2021: Cycle 2 Day 1 of VRd chemotherapy 03/15/2021: Cycle 3 Day 1 of VRd chemotherapy 04/05/2021: Cycle 4 Day 1 of VRd chemotherapy 04/26/2021: Cycle 5 Day 1 of VRd chemotherapy  Interval History:  [facility] 75 y.o. female with medical history significant for lambda light chain multiple myeloma who presents for a follow up visit. The patient's last visit was on 04/19/2021. In the interim since the last visit she continued on VRd chemotherapy.   On exam today [facility] reports she has been having increases in fatigue.  She reports that her fatigue was markedly higher than usual this week.  She notes that she tried to cut back on Tylenol for the bone pain due to this fatigue, but it is quite painful and she needs to maintain the high doses.  She also reports that she is most fatigued on Thursdays and tends to be sore afterwards, but her PT is performed on Wednesdays.  She is also been having some issues with chills particularly in cold environments.  She has had some brain fog which has been consistent throughout treatment.  Fortunately she is now walking without a walker or cane.  She notes that she is  ambulating well and otherwise has no questions concerns or complaints.  She denies any fevers, chills, sweats, nausea, vomiting or diarrhea. A full 10 point ROS is listed below.   MEDICAL HISTORY:  Past Medical History:  Diagnosis Date   Asthma    Cataract    GERD (gastroesophageal reflux disease)    Hyperlipidemia    Hypertension    Thyroid disease     SURGICAL HISTORY: Past Surgical History:  Procedure Laterality Date   Cataract Surgery     CORONARY ANGIOPLASTY WITH STENT PLACEMENT     EYE SURGERY     ORTHOPEDIC SURGERY  2012   TONSILLECTOMY  1951    SOCIAL HISTORY: Social History   Socioeconomic History   Marital status: Married    Spouse name: Not on file   Number of children: 0   Years of education: Not on file   Highest education level: Not on file  Occupational History   Occupation: unemployed  Tobacco Use   Smoking status: Never   Smokeless tobacco: Never  Vaping Use   Vaping Use: Never used  Substance and Sexual Activity   Alcohol use: Yes    Alcohol/week: 1.0 standard drink    Types: 1 Glasses of wine per week    Comment: occassionally   Drug use: No   Sexual activity: Not Currently  Other Topics Concern   Not on file  Social History Narrative   Married. Education: college. Pt does exercise-   Social Determinants of Health   Financial Resource Strain: Not on file  Food Insecurity: Not on file  Transportation  Needs: Not on file  Physical Activity: Not on file  Stress: Not on file  Social Connections: Not on file  Intimate Partner Violence: Not on file    FAMILY HISTORY: Family History  Problem Relation Age of Onset   Cancer Father    Heart disease Brother    Dementia Brother    Leukemia Brother    Breast cancer Neg Hx    Colon cancer Neg Hx    Esophageal cancer Neg Hx    Pancreatic cancer Neg Hx    Rectal cancer Neg Hx    Stomach cancer Neg Hx     ALLERGIES:  is allergic to poison ivy extract, plavix [clopidogrel bisulfate], and  other.  MEDICATIONS:  Current Outpatient Medications  Medication Sig Dispense Refill   acetaminophen (TYLENOL 8 HOUR ARTHRITIS PAIN) 650 MG CR tablet Take 1 tablet (650 mg total) by mouth every 8 (eight) hours as needed for pain.     acyclovir (ZOVIRAX) 400 MG tablet Take 1 tablet (400 mg total) by mouth 2 (two) times daily. 60 tablet 5   aspirin 81 MG tablet Take 81 mg by mouth daily.      atorvastatin (LIPITOR) 80 MG tablet Take 1 tablet (80 mg total) by mouth daily at 6 PM. 90 tablet 3   benazepril (LOTENSIN) 10 MG tablet TAKE 1 TABLET(10 MG) BY MOUTH DAILY 90 tablet 3   calcium carbonate (OS-CAL) 1250 (500 Ca) MG chewable tablet Chew 1 tablet by mouth daily.     cholecalciferol (VITAMIN D3) 25 MCG (1000 UNIT) tablet Take 2,000 Units by mouth daily.     dexamethasone (DECADRON) 4 MG tablet Take 10 tablets (40 mg total) by mouth once a week. 40 tablet 3   DM-APAP-CPM (CORICIDIN HBP PO) Take by mouth.      famotidine-calcium carbonate-magnesium hydroxide (PEPCID COMPLETE) 10-800-165 MG chewable tablet Chew 1 tablet by mouth daily.     lenalidomide (REVLIMID) 25 MG capsule Take 1 capsule (25 mg total) by mouth daily. Take for 14 days; then none for 7 days. Repeat every 21 days [employer] Auth # [id number]  Date Obtained 05/01/21 14 capsule 0   levothyroxine (SYNTHROID) 25 MCG tablet Take 1 tablet (25 mcg total) by mouth daily. 90 tablet 0   ondansetron (ZOFRAN) 8 MG tablet Take 1 tablet (8 mg total) by mouth every 8 (eight) hours as needed for nausea or vomiting. 30 tablet 0   polyethylene glycol (MIRALAX / GLYCOLAX) 17 g packet Take 17 g by mouth daily.     prochlorperazine (COMPAZINE) 10 MG tablet Take 1 tablet (10 mg total) by mouth every 6 (six) hours as needed for nausea or vomiting. 30 tablet 0   No current facility-administered medications for this visit.    REVIEW OF SYSTEMS:   Constitutional: ( - ) fevers, ( - )  chills , ( - ) night sweats Eyes: ( - ) blurriness of vision, ( - ) double  vision, ( - ) watery eyes Ears, nose, mouth, throat, and face: ( - ) mucositis, ( - ) sore throat Respiratory: ( - ) cough, ( - ) dyspnea, ( - ) wheezes Cardiovascular: ( - ) palpitation, ( - ) chest discomfort, ( - ) lower extremity swelling Gastrointestinal:  ( - ) nausea, ( - ) heartburn, ( - ) change in bowel habits Skin: ( - ) abnormal skin rashes Lymphatics: ( - ) new lymphadenopathy, ( - ) easy bruising Neurological: ( - ) numbness, ( - ) tingling, ( - )  new weaknesses Behavioral/Psych: ( - ) mood change, ( - ) new changes  All other systems were reviewed with the patient and are negative.  PHYSICAL EXAMINATION: ECOG PERFORMANCE STATUS: 1 - Symptomatic but completely ambulatory  Vitals:   05/03/21 0856  BP: 127/63  Pulse: 64  Resp: 17  Temp: 98.2 F (36.8 C)  SpO2: 100%   Filed Weights   05/03/21 0856  Weight: 152 lb 8 oz (69.2 kg)    GENERAL: well appearing elderly Caucasian female in NAD  SKIN: skin color, texture, turgor are normal, no rashes or significant lesions EYES: conjunctiva are pink and non-injected, sclera clear LUNGS: clear to auscultation and percussion with normal breathing effort HEART: regular rate & rhythm and no murmurs and no lower extremity edema Musculoskeletal: no cyanosis of digits and no clubbing  PSYCH: alert & oriented x 3, fluent speech NEURO: no focal motor/sensory deficits  LABORATORY DATA:  I have reviewed the data as listed CBC Latest Ref Rng & Units 05/03/2021 04/26/2021 04/19/2021  WBC 4.0 - 10.5 K/uL 5.9 7.1 7.8  Hemoglobin 12.0 - 15.0 g/dL 11.1(L) 10.7(L) 10.8(L)  Hematocrit 36.0 - 46.0 % 34.1(L) 33.0(L) 32.1(L)  Platelets 150 - 400 K/uL 217 234 258    CMP Latest Ref Rng & Units 05/03/2021 04/26/2021 04/19/2021  Glucose 70 - 99 mg/dL 88 92 87  BUN 8 - 23 mg/dL '9 15 14  ' Creatinine 0.44 - 1.00 mg/dL 0.75 0.72 0.71  Sodium 135 - 145 mmol/L 140 137 140  Potassium 3.5 - 5.1 mmol/L 4.1 4.3 3.9  Chloride 98 - 111 mmol/L 106 107 107   CO2 22 - 32 mmol/L '26 22 22  ' Calcium 8.9 - 10.3 mg/dL 9.2 9.7 9.1  Total Protein 6.5 - 8.1 g/dL 6.4(L) 6.3(L) 6.1(L)  Total Bilirubin 0.3 - 1.2 mg/dL 0.7 0.5 0.6  Alkaline Phos 38 - 126 U/L 75 91 88  AST 15 - 41 U/L '21 19 16  ' ALT 0 - 44 U/L '23 23 18    ' Lab Results  Component Value Date   MPROTEIN Not Observed 03/29/2021   MPROTEIN 0.1 (H) 03/08/2021   MPROTEIN Not Observed 02/15/2021   Lab Results  Component Value Date   KPAFRELGTCHN 14.9 04/05/2021   KPAFRELGTCHN 18.1 03/29/2021   KPAFRELGTCHN 16.3 03/01/2021   LAMBDASER 74.6 (H) 04/05/2021   LAMBDASER 94.4 (H) 03/29/2021   LAMBDASER 409.8 (H) 03/01/2021   KAPLAMBRATIO 0.20 (L) 04/05/2021   KAPLAMBRATIO 1.13 (L) 03/29/2021   KAPLAMBRATIO 0.19 (L) 03/29/2021    RADIOGRAPHIC STUDIES: No results found.  ASSESSMENT & PLAN Myrakle Wingler 75 y.o. female with medical history significant for lambda light chain multiple myeloma who presents for a follow up visit.   After review the labs, review the records, schedule the patient the findings most consistent with a lambda light chain multiple myeloma.  This is getting confirmed with a bone marrow biopsy showing an abnormal plasma cell population of 28% and M protein/Bence-Jones proteins in the urine.  Given these findings the patient now carries a diagnosis of lambda light chain multiple myeloma.  R-ISS: Stage II (high risk genetics)  We will proceed with VRd chemotherapy.  This consists of bortezomib 1.3 mg per metered squared q week, lenalidomide 25 mg p.o. x14 days of 21-day cycle, and dexamethasone 40 mg p.o. q. Weekly.  This will be continued until he reached a VGPR/ complete 8 cycles of triplet therapy, at which time we can consider transition over to maintenance Revlimid 10 mg p.o.  daily 21 of 28 days per cycle.  Ms. Terrance is not a candidate for bone marrow transplant based on her advanced age.  # Lambda Light Chain Multiple Myeloma --diagnosis confirmed with bone marrow  biopsy and urine protein analysis.   --patient has good functional status and would be an excellent candidate for VRd chemotherapy. I do not believe she would be a bone marrow transplant candidate based on her age.  --assure monthly restaging labs with SPEP, UPEP, and SFLC --patient declines port placement at this time --Cycle 1 Day 1 of therapy started on 02/01/2021.  --today is Cycle 5 Day 8 --excellent response noted within 1st cycle, normalization of urine free light chains and undetectable M protein.  --can consider transition to maintenance therapy once her SFLC have normalized and she has completed 8 full cycles of triplet therapy. She is rapidly approaching that point.  --RTC in 2 weeks with continued weekly therapy  #Supportive Care --chemotherapy education complete  --patient declines port placement at this time --zofran 40m q8H PRN and compazine 150mPO q6H for nausea --acyclovir 40056mO BID for VCZ prophylaxis --zometa therapy performed on 03/29/2021. Repeat q 3 months.  -- no pain medication required at this time.   No orders of the defined types were placed in this encounter.  All questions were answered. The patient knows to call the clinic with any problems, questions or concerns.  A total of more than 30 minutes were spent on this encounter and over half of that time was spent on counseling and coordination of care as outlined above.   JohLedell PeoplesD Department of Hematology/Oncology [facility] Centerone: [phone]: [phone]: [email or web address]  05/03/2021 9:05 AM   Literature Support:  SuzBennett Scrapertezomib, lenalidomide, and dexamethasone in transplant-eligible newly diagnosed multiple myeloma patients: a multicenter retrospective comparative analysis. Int J HHinton Dyer020  Jan;111(1):103-111. doi: 10.1007/s12185-019-02764-1.  -- Overall response, very good partial response, and complete response rates after VRD were 96.4%, 45.5%, and 20.0%, respectively (median follow-up period, 17.7 months). The 1-year progression-free survival (PFS) and overall survival rates were 95.8% and 98.2%, respectively. The response rate and PFS were similar between the groups, regardless of cytogenetic risk and age.

## 2021-05-03 NOTE — Patient Instructions (Signed)
[facility]  Discharge Instructions: Thank you for choosing [facility] to provide your oncology and hematology care.   If you have a lab appointment with the [facility], please go directly to the [facility] and check in at the registration area.   Wear comfortable clothing and clothing appropriate for easy access to any Portacath or PICC line.   We strive to give you quality time with your provider. You may need to reschedule your appointment if you arrive late (15 or more minutes).  Arriving late affects you and other patients whose appointments are after yours.  Also, if you miss three or more appointments without notifying the office, you may be dismissed from the clinic at the provider's discretion.      For prescription refill requests, have your pharmacy contact our office and allow 72 hours for refills to be completed.    Today you received the following chemotherapy and/or immunotherapy agents Velcade       To help prevent nausea and vomiting after your treatment, we encourage you to take your nausea medication as directed.  BELOW ARE SYMPTOMS THAT SHOULD BE REPORTED IMMEDIATELY: *FEVER GREATER THAN 100.4 F (38 C) OR HIGHER *CHILLS OR SWEATING *NAUSEA AND VOMITING THAT IS NOT CONTROLLED WITH YOUR NAUSEA MEDICATION *UNUSUAL SHORTNESS OF BREATH *UNUSUAL BRUISING OR BLEEDING *URINARY PROBLEMS (pain or burning when urinating, or frequent urination) *BOWEL PROBLEMS (unusual diarrhea, constipation, pain near the anus) TENDERNESS IN MOUTH AND THROAT WITH OR WITHOUT PRESENCE OF ULCERS (sore throat, sores in mouth, or a toothache) UNUSUAL RASH, SWELLING OR PAIN  UNUSUAL VAGINAL DISCHARGE OR ITCHING   Items with * indicate a potential emergency and should be followed up as soon as possible or go to the Emergency Department if any problems should occur.  Please show the CHEMOTHERAPY ALERT CARD or IMMUNOTHERAPY ALERT CARD at check-in to  the Emergency Department and triage nurse.  Should you have questions after your visit or need to cancel or reschedule your appointment, please contact [facility]  Dept: [phone]  and follow the prompts.  Office hours are 8:00 a.m. to 4:30 p.m. Monday - Friday. Please note that voicemails left after 4:00 p.m. may not be returned until the following business day.  We are closed weekends and major holidays. You have access to a nurse at all times for urgent questions. Please call the main number to the clinic Dept: [phone] and follow the prompts.   For any non-urgent questions, you may also contact your provider using MyChart. We now offer e-Visits for anyone 18 and older to request care online for non-urgent symptoms. For details visit [email or web address].   Also download the MyChart app! Go to the app store, search "MyChart", open the app, select [facility], and log in with your MyChart username and password.  Due to Covid, a mask is required upon entering the hospital/clinic. If you do not have a mask, one will be given to you upon arrival. For doctor visits, patients may have 1 support person aged 18 or older with them. For treatment visits, patients cannot have anyone with them due to current Covid guidelines and our immunocompromised population.   

## 2021-05-04 LAB — LIPID PANEL
Chol/HDL Ratio: 2.1 ratio (ref 0.0–4.4)
Cholesterol, Total: 163 mg/dL (ref 100–199)
HDL: 78 mg/dL (ref >39)
LDL Chol Calc (NIH): 69 mg/dL (ref 0–99)
Triglycerides: 85 mg/dL (ref 0–149)
VLDL Cholesterol Cal: 16 mg/dL (ref 5–40)

## 2021-05-06 ENCOUNTER — Encounter: Payer: Medicare Other | Admitting: Physical Therapy

## 2021-05-06 LAB — UPEP/UIFE/LIGHT CHAINS/TP, 24-HR UR
% BETA, Urine: 0 %
ALPHA 1 URINE: 0 %
Albumin, U: 100 %
Alpha 2, Urine: 0 %
Free Kappa Lt Chains,Ur: 4.75 mg/L (ref 1.17–86.46)
Free Kappa/Lambda Ratio: 6.01 (ref 1.83–14.26)
Free Lambda Lt Chains,Ur: 0.79 mg/L (ref 0.27–15.21)
GAMMA GLOBULIN URINE: 0 %
Total Protein, Urine-Ur/day: <108 mg/24 hr (ref 30–150)
Total Protein, Urine: <4 mg/dL
Total Volume: 2700

## 2021-05-06 LAB — KAPPA/LAMBDA LIGHT CHAINS
Kappa free light chain: 19.7 mg/L — ABNORMAL HIGH (ref 3.3–19.4)
Kappa, lambda light chain ratio: 0.38 (ref 0.26–1.65)
Lambda free light chains: 51.7 mg/L — ABNORMAL HIGH (ref 5.7–26.3)

## 2021-05-07 LAB — MULTIPLE MYELOMA PANEL, SERUM
Albumin SerPl Elph-Mcnc: 4.3 g/dL (ref 2.9–4.4)
Albumin/Glob SerPl: 2.2 — ABNORMAL HIGH (ref 0.7–1.7)
Alpha 1: 0.2 g/dL (ref 0.0–0.4)
Alpha2 Glob SerPl Elph-Mcnc: 0.7 g/dL (ref 0.4–1.0)
B-Globulin SerPl Elph-Mcnc: 0.8 g/dL (ref 0.7–1.3)
Gamma Glob SerPl Elph-Mcnc: 0.3 g/dL — ABNORMAL LOW (ref 0.4–1.8)
Globulin, Total: 2 g/dL — ABNORMAL LOW (ref 2.2–3.9)
IgA: 65 mg/dL (ref 64–422)
IgG (Immunoglobin G), Serum: 413 mg/dL — ABNORMAL LOW (ref 586–1602)
IgM (Immunoglobulin M), Srm: 19 mg/dL — ABNORMAL LOW (ref 26–217)
Total Protein ELP: 6.3 g/dL (ref 6.0–8.5)

## 2021-05-08 ENCOUNTER — Encounter: Payer: Self-pay | Admitting: Hematology and Oncology

## 2021-05-10 ENCOUNTER — Inpatient Hospital Stay: Payer: Medicare Other

## 2021-05-10 ENCOUNTER — Other Ambulatory Visit: Payer: Self-pay

## 2021-05-10 VITALS — BP 121/63 | HR 72 | Temp 98.6°F | Wt 151.9 lb

## 2021-05-10 DIAGNOSIS — C9000 Multiple myeloma not having achieved remission: Secondary | ICD-10-CM

## 2021-05-10 DIAGNOSIS — Z5112 Encounter for antineoplastic immunotherapy: Secondary | ICD-10-CM | POA: Diagnosis not present

## 2021-05-10 LAB — CMP (CANCER CENTER ONLY)
ALT: 25 U/L (ref 0–44)
AST: 22 U/L (ref 15–41)
Albumin: 4.1 g/dL (ref 3.5–5.0)
Alkaline Phosphatase: 70 U/L (ref 38–126)
Anion gap: 7 (ref 5–15)
BUN: 10 mg/dL (ref 8–23)
CO2: 26 mmol/L (ref 22–32)
Calcium: 9.4 mg/dL (ref 8.9–10.3)
Chloride: 105 mmol/L (ref 98–111)
Creatinine: 0.85 mg/dL (ref 0.44–1.00)
GFR, Estimated: >60 mL/min (ref >60)
Glucose, Bld: 91 mg/dL (ref 70–99)
Potassium: 3.9 mmol/L (ref 3.5–5.1)
Sodium: 138 mmol/L (ref 135–145)
Total Bilirubin: 0.7 mg/dL (ref 0.3–1.2)
Total Protein: 6.4 g/dL — ABNORMAL LOW (ref 6.5–8.1)

## 2021-05-10 LAB — CBC WITH DIFFERENTIAL (CANCER CENTER ONLY)
Abs Immature Granulocytes: 0.03 10*3/uL (ref 0.00–0.07)
Basophils Absolute: 0.1 10*3/uL (ref 0.0–0.1)
Basophils Relative: 1 %
Eosinophils Absolute: 0.5 10*3/uL (ref 0.0–0.5)
Eosinophils Relative: 6 %
HCT: 34.1 % — ABNORMAL LOW (ref 36.0–46.0)
Hemoglobin: 11.2 g/dL — ABNORMAL LOW (ref 12.0–15.0)
Immature Granulocytes: 0 %
Lymphocytes Relative: 7 %
Lymphs Abs: 0.6 10*3/uL — ABNORMAL LOW (ref 0.7–4.0)
MCH: 30.4 pg (ref 26.0–34.0)
MCHC: 32.8 g/dL (ref 30.0–36.0)
MCV: 92.4 fL (ref 80.0–100.0)
Monocytes Absolute: 0.8 10*3/uL (ref 0.1–1.0)
Monocytes Relative: 10 %
Neutro Abs: 5.7 10*3/uL (ref 1.7–7.7)
Neutrophils Relative %: 76 %
Platelet Count: 258 10*3/uL (ref 150–400)
RBC: 3.69 MIL/uL — ABNORMAL LOW (ref 3.87–5.11)
RDW: 15.1 % (ref 11.5–15.5)
WBC Count: 7.5 10*3/uL (ref 4.0–10.5)
nRBC: 0 % (ref 0.0–0.2)

## 2021-05-10 LAB — LACTATE DEHYDROGENASE: LDH: 160 U/L (ref 98–192)

## 2021-05-10 MED ORDER — BORTEZOMIB CHEMO SQ INJECTION 3.5 MG (2.5MG/ML)
1.3000 mg/m2 | Freq: Once | INTRAMUSCULAR | Status: AC
  Administered 2021-05-10: 2.25 mg via SUBCUTANEOUS
  Filled 2021-05-10: qty 0.9

## 2021-05-10 NOTE — Patient Instructions (Signed)
[facility]  Discharge Instructions: Thank you for choosing [facility] to provide your oncology and hematology care.   If you have a lab appointment with the Cancer Center, please go directly to the Cancer Center and check in at the registration area.   Wear comfortable clothing and clothing appropriate for easy access to any Portacath or PICC line.   We strive to give you quality time with your provider. You may need to reschedule your appointment if you arrive late (15 or more minutes).  Arriving late affects you and other patients whose appointments are after yours.  Also, if you miss three or more appointments without notifying the office, you may be dismissed from the clinic at the provider's discretion.      For prescription refill requests, have your pharmacy contact our office and allow 72 hours for refills to be completed.    Today you received the following chemotherapy and/or immunotherapy agents Velcade       To help prevent nausea and vomiting after your treatment, we encourage you to take your nausea medication as directed.  BELOW ARE SYMPTOMS THAT SHOULD BE REPORTED IMMEDIATELY: *FEVER GREATER THAN 100.4 F (38 C) OR HIGHER *CHILLS OR SWEATING *NAUSEA AND VOMITING THAT IS NOT CONTROLLED WITH YOUR NAUSEA MEDICATION *UNUSUAL SHORTNESS OF BREATH *UNUSUAL BRUISING OR BLEEDING *URINARY PROBLEMS (pain or burning when urinating, or frequent urination) *BOWEL PROBLEMS (unusual diarrhea, constipation, pain near the anus) TENDERNESS IN MOUTH AND THROAT WITH OR WITHOUT PRESENCE OF ULCERS (sore throat, sores in mouth, or a toothache) UNUSUAL RASH, SWELLING OR PAIN  UNUSUAL VAGINAL DISCHARGE OR ITCHING   Items with * indicate a potential emergency and should be followed up as soon as possible or go to the Emergency Department if any problems should occur.  Please show the CHEMOTHERAPY ALERT CARD or IMMUNOTHERAPY ALERT CARD at check-in to  the Emergency Department and triage nurse.  Should you have questions after your visit or need to cancel or reschedule your appointment, please contact [facility]  Dept: [phone]  and follow the prompts.  Office hours are 8:00 a.m. to 4:30 p.m. Monday - Friday. Please note that voicemails left after 4:00 p.m. may not be returned until the following business day.  We are closed weekends and major holidays. You have access to a nurse at all times for urgent questions. Please call the main number to the clinic Dept: [phone] and follow the prompts.   For any non-urgent questions, you may also contact your provider using MyChart. We now offer e-Visits for anyone 18 and older to request care online for non-urgent symptoms. For details visit [email or web address].   Also download the MyChart app! Go to the app store, search "MyChart", open the app, select [facility], and log in with your MyChart username and password.  Due to Covid, a mask is required upon entering the hospital/clinic. If you do not have a mask, one will be given to you upon arrival. For doctor visits, patients may have 1 support person aged 18 or older with them. For treatment visits, patients cannot have anyone with them due to current Covid guidelines and our immunocompromised population.   

## 2021-05-13 ENCOUNTER — Other Ambulatory Visit: Payer: Self-pay | Admitting: Hematology and Oncology

## 2021-05-15 ENCOUNTER — Ambulatory Visit: Payer: Medicare Other | Admitting: Physical Therapy

## 2021-05-15 ENCOUNTER — Other Ambulatory Visit: Payer: Self-pay

## 2021-05-15 DIAGNOSIS — R262 Difficulty in walking, not elsewhere classified: Secondary | ICD-10-CM | POA: Diagnosis not present

## 2021-05-15 DIAGNOSIS — Z9181 History of falling: Secondary | ICD-10-CM | POA: Diagnosis not present

## 2021-05-15 NOTE — Therapy (Signed)
[facility] [address], [place], [place], [address] Phone: [phone]   Fax:  [phone]  Physical Therapy Treatment  Patient Details  Name: Doris Wilkerson MRN: [id number] Date of Birth: [birth year] Referring Provider (PT): Narda Rutherford, MD   Encounter Date: 05/15/2021   PT End of Session - 05/15/21 1726     Visit Number 7    Date for PT Re-Evaluation 05/24/21    Authorization Type Medicare    Authorization Time Period KX at 15 visits    Progress Note Due on Visit 10    PT Start Time 1402    PT Stop Time 1441    PT Time Calculation (min) 39 min    Activity Tolerance Patient tolerated treatment well;No increased pain    Behavior During Therapy WFL for tasks assessed/performed             Past Medical History:  Diagnosis Date   Asthma    Cataract    GERD (gastroesophageal reflux disease)    Hyperlipidemia    Hypertension    Thyroid disease     Past Surgical History:  Procedure Laterality Date   Cataract Surgery     CORONARY ANGIOPLASTY WITH STENT PLACEMENT     EYE SURGERY     ORTHOPEDIC SURGERY  2012   TONSILLECTOMY  1951    There were no vitals filed for this visit.   Subjective Assessment - 05/15/21 1713     Subjective Had to cancel last week due to having severe diarrhea. MD attributes this to increased chemo dosage. Patient states she feels better today but has more bone pain than usual.    Pertinent History multiple myeloma (current)    Limitations Sitting;Walking;Standing    How long can you sit comfortably? 20 minutes with extra cushioning    How long can you stand comfortably? unlimited    How long can you walk comfortably? 15-20 minutes    Patient Stated Goals more mobile; decreased falls; get through remainder of chemotherapy (16 more weeks)    Currently in Pain? Yes    Pain Score 4     Pain Location Buttocks    Pain Orientation Left    Pain Descriptors / Indicators Discomfort     Pain Type Chronic pain    Pain Onset More than a month ago    Pain Frequency Intermittent                               [facility] Adult PT Treatment/Exercise - 05/15/21 0001       Lumbar Exercises: Standing   Row Power tower;Both;15 reps    Row Limitations 20#    Shoulder Extension Strengthening;Power Tower;Both;10 reps    Shoulder Extension Limitations 15#    Other Standing Lumbar Exercises resisted walk; 20lbs; Lt/Rt x1 rep   Patient requesting not to continue with activity   Other Standing Lumbar Exercises Pallof: power tower, 15# Lt/Rt      Lumbar Exercises: Seated   Other Seated Lumbar Exercises seated BOSU: horizontal abduction yellow tband; sashes, x10 Lt/Rt yellow tband      Knee/Hip Exercises: Aerobic   Nustep Level 2 x 8 minutes; PT present to discuss progress and assess response to activity      Knee/Hip Exercises: Standing   Lateral Step Up Right;Left;1 set;10 reps;Hand Hold: 1;Step Height: 4"    Forward Step Up Right;Left;1 set;10 reps;Hand Hold: 1  PT Short Term Goals - 05/15/21 1725       PT SHORT TERM GOAL #1   Title Patient will be independent with HEP for continued progression at home.    Time 4    Period Weeks    Status Achieved    Target Date 04/23/21      PT SHORT TERM GOAL #2   Title Patient will complete 6 minute walk test without use of AD with minimal gait impairments and no reports of increased bone pain.    Time 4    Period Weeks    Status On-going               PT Long Term Goals - 03/26/21 1558       PT LONG TERM GOAL #1   Title Patient will improve FOTO score to 60 to indicate improved overall function.    Time 8    Period Weeks    Status New    Target Date 05/21/21      PT LONG TERM GOAL #2   Title Patient will be independent with advanced HEP for long term management of symptoms post D/C.    Time 8    Period Weeks    Status New    Target Date 05/21/21      PT LONG  TERM GOAL #3   Title Patient will complete five time sit to stand in </= 13s to indicate decreased fall risk.    Baseline 18s    Time 8    Period Weeks    Status New    Target Date 05/21/21                   Plan - 05/15/21 1715     Clinical Impression Statement Patient requiring increased seated recovery periods this session. Unable to tolerate resisted walking activity this date despite having completed in previous session without adverse event. Cuing provided for core engagement when performing seated BOSU exercises. Would benefit from continued skilled intervention to address impairments for improved functional mobility and functional activity tolerance.    Personal Factors and Comorbidities Comorbidity 1    Comorbidities Cancer (current)    Examination-Activity Limitations Sit;Locomotion Level;Transfers    Examination-Participation Restrictions Community Activity;Cleaning    Rehab Potential Fair    PT Frequency 2x / week    PT Duration 8 weeks    PT Treatment/Interventions ADLs/Self Care Home Management;Aquatic Therapy;Gait training;Functional mobility training;Therapeutic activities;Therapeutic exercise;Balance training;Neuromuscular re-education;Patient/family education    PT Next Visit Plan continue static and dynamic balance as well as global strengthening adding functional component when appropriate    PT Home Exercise Plan Access Code [email or web address]    Consulted and Agree with Plan of Care Patient             Patient will benefit from skilled therapeutic intervention in order to improve the following deficits and impairments:  Decreased activity tolerance, Abnormal gait, Difficulty walking, Pain  Visit Diagnosis: Difficulty in walking, not elsewhere classified  History of falling     Problem List Patient Active Problem List   Diagnosis Date Noted   Physical debility 03/21/2021   Dizziness 03/21/2021   Vertigo 03/21/2021   Anemia due to antineoplastic  chemotherapy 03/21/2021   Skin changes related to chemotherapy 03/21/2021   Other constipation 03/21/2021   Multiple myeloma not having achieved remission ([place]) 01/27/2021   Hordeolum externum of left lower eyelid 06/28/2018   CAD (coronary artery disease) 12/09/2011   Dyslipidemia 12/09/2011  Osteopenia 12/09/2011   Hypothyroid 12/09/2011   Asthma 12/09/2011   Everardo All PT, DPT  05/15/21 5:27 PM   [facility] [address] [place], [place], [address] Phone: [phone]   Fax:  [phone]  Name: Doris Wilkerson MRN: [id number] Date of Birth: 01/29/1946

## 2021-05-16 NOTE — Progress Notes (Signed)
[place] [phone]   [phone]  PROGRESS NOTE  Patient Care Team: [name], MD as PCP - General (Family Medicine) [name], MD as Consulting Physician (Cardiology) [name], MD (Ophthalmology)  Hematological/Oncological History # Lambda Light Chain Multiple Myeloma 12/26/2020: MRI of pelvis showed lytic lesions on L4, L5, the sacrum, with pathologic fracture of left inferior pubic ramus. Concerning for multiple myeloma vs metastatic disease 12/31/2020: establish care with Dr. Lorenso Courier. UPEP showed marked Bence Jones protein, findings concerning for multiple myleoma 01/21/2021: bone marrow biopsy confirms multiple myeloma with atypical plasma cells representing 28% of all cells in the aspirate  02/01/2021: Cycle 1 Day 1 of VRd chemotherapy 02/22/2021: Cycle 2 Day 1 of VRd chemotherapy 03/15/2021: Cycle 3 Day 1 of VRd chemotherapy 04/05/2021: Cycle 4 Day 1 of VRd chemotherapy 04/26/2021: Cycle 5 Day 1 of VRd chemotherapy 05/17/2021: Cycle 6 Day 1 of VRd chemotherapy  Interval History:  Doris Wilkerson 75 y.o. female with medical history significant for lambda light chain multiple myeloma who presents for a follow up visit. The patient's last visit was on 05/03/2021. In the interim since the last visit she continued on VRd chemotherapy.   On exam today Doris Wilkerson reports she has been well in the interim since our last visit.  She reports that she has been feeling good as this is her week off Revlimid.  She notes that she did have a some fatigue last week and that she felt wiped out.  She also had swelling of her feet and ankles which she reached out to [place] for previously.  This has fortunately resolved today.  She notes that she does continue to have bone pain which is most pronounced after she does physical therapy.  She notes it does not hurt at the time of physical therapy but typically occurs afterwards.  She does of there was a particular exercise  she did which was very painful for her sacrum.  Otherwise her weight has been stable.  She notes that she is ambulating well and otherwise has no questions concerns or complaints.  She denies any fevers, chills, sweats, nausea, vomiting or diarrhea. A full 10 point ROS is listed below.   MEDICAL HISTORY:  Past Medical History:  Diagnosis Date   Asthma    Cataract    GERD (gastroesophageal reflux disease)    Hyperlipidemia    Hypertension    Thyroid disease     SURGICAL HISTORY: Past Surgical History:  Procedure Laterality Date   Cataract Surgery     CORONARY ANGIOPLASTY WITH STENT PLACEMENT     EYE SURGERY     ORTHOPEDIC SURGERY  2012   TONSILLECTOMY  1951    SOCIAL HISTORY: Social History   Socioeconomic History   Marital status: Married    Spouse name: Not on file   Number of children: 0   Years of education: Not on file   Highest education level: Not on file  Occupational History   Occupation: unemployed  Tobacco Use   Smoking status: Never   Smokeless tobacco: Never  Vaping Use   Vaping Use: Never used  Substance and Sexual Activity   Alcohol use: Yes    Alcohol/week: 1.0 standard drink    Types: 1 Glasses of wine per week    Comment: occassionally   Drug use: No   Sexual activity: Not Currently  Other Topics Concern   Not on file  Social History Narrative   Married. Education: college.  Pt does exercise-   Social Determinants of Health   Financial Resource Strain: Not on file  Food Insecurity: Not on file  Transportation Needs: Not on file  Physical Activity: Not on file  Stress: Not on file  Social Connections: Not on file  Intimate Partner Violence: Not on file    FAMILY HISTORY: Family History  Problem Relation Age of Onset   Cancer Father    Heart disease Brother    Dementia Brother    Leukemia Brother    Breast cancer Neg Hx    Colon cancer Neg Hx    Esophageal cancer Neg Hx    Pancreatic cancer Neg Hx    Rectal cancer Neg Hx     Stomach cancer Neg Hx     ALLERGIES:  is allergic to poison ivy extract, plavix [clopidogrel bisulfate], and other.  MEDICATIONS:  Current Outpatient Medications  Medication Sig Dispense Refill   acetaminophen (TYLENOL 8 HOUR ARTHRITIS PAIN) 650 MG CR tablet Take 1 tablet (650 mg total) by mouth every 8 (eight) hours as needed for pain.     acyclovir (ZOVIRAX) 400 MG tablet Take 1 tablet (400 mg total) by mouth 2 (two) times daily. 60 tablet 5   aspirin 81 MG tablet Take 81 mg by mouth daily.      atorvastatin (LIPITOR) 80 MG tablet Take 1 tablet (80 mg total) by mouth daily at 6 PM. 90 tablet 3   benazepril (LOTENSIN) 10 MG tablet TAKE 1 TABLET(10 MG) BY MOUTH DAILY 90 tablet 3   calcium carbonate (OS-CAL) 1250 (500 Ca) MG chewable tablet Chew 1 tablet by mouth daily.     cholecalciferol (VITAMIN D3) 25 MCG (1000 UNIT) tablet Take 2,000 Units by mouth daily.     dexamethasone (DECADRON) 4 MG tablet TAKE 10 TABLETS BY MOUTH ONCE A WEEK 40 tablet 3   DM-APAP-CPM (CORICIDIN HBP PO) Take by mouth.      famotidine-calcium carbonate-magnesium hydroxide (PEPCID COMPLETE) 10-800-165 MG chewable tablet Chew 1 tablet by mouth daily.     lenalidomide (REVLIMID) 25 MG capsule Take 1 capsule (25 mg total) by mouth daily. Take for 14 days; then none for 7 days. Repeat every 21 days [employer] Auth # [id number]  Date Obtained 05/01/21 14 capsule 0   levothyroxine (SYNTHROID) 25 MCG tablet Take 1 tablet (25 mcg total) by mouth daily. 90 tablet 0   ondansetron (ZOFRAN) 8 MG tablet Take 1 tablet (8 mg total) by mouth every 8 (eight) hours as needed for nausea or vomiting. 30 tablet 0   polyethylene glycol (MIRALAX / GLYCOLAX) 17 g packet Take 17 g by mouth daily.     prochlorperazine (COMPAZINE) 10 MG tablet Take 1 tablet (10 mg total) by mouth every 6 (six) hours as needed for nausea or vomiting. 30 tablet 0   No current facility-administered medications for this visit.   Facility-Administered Medications Ordered  in Other Visits  Medication Dose Route Frequency Provider Last Rate Last Admin   bortezomib SQ (VELCADE) chemo injection (2.54m/mL concentration) 2.25 mg  1.3 mg/m2 (Treatment Plan Recorded) Subcutaneous Once DOrson Slick MD        REVIEW OF SYSTEMS:   Constitutional: ( - ) fevers, ( - )  chills , ( - ) night sweats Eyes: ( - ) blurriness of vision, ( - ) double vision, ( - ) watery eyes Ears, nose, mouth, throat, and face: ( - ) mucositis, ( - ) sore throat Respiratory: ( - ) cough, ( - )  dyspnea, ( - ) wheezes Cardiovascular: ( - ) palpitation, ( - ) chest discomfort, ( - ) lower extremity swelling Gastrointestinal:  ( - ) nausea, ( - ) heartburn, ( - ) change in bowel habits Skin: ( - ) abnormal skin rashes Lymphatics: ( - ) new lymphadenopathy, ( - ) easy bruising Neurological: ( - ) numbness, ( - ) tingling, ( - ) new weaknesses Behavioral/Psych: ( - ) mood change, ( - ) new changes  All other systems were reviewed with the patient and are negative.  PHYSICAL EXAMINATION: ECOG PERFORMANCE STATUS: 1 - Symptomatic but completely ambulatory  Vitals:   05/17/21 1039  BP: 131/68  Pulse: 77  Resp: 17  Temp: 98.7 F (37.1 C)  SpO2: 100%    Filed Weights   05/17/21 1039  Weight: 152 lb 11.2 oz (69.3 kg)     GENERAL: well appearing elderly Caucasian female in NAD  SKIN: skin color, texture, turgor are normal, no rashes or significant lesions EYES: conjunctiva are pink and non-injected, sclera clear LUNGS: clear to auscultation and percussion with normal breathing effort HEART: regular rate & rhythm and no murmurs and trace lower extremity edema Musculoskeletal: no cyanosis of digits and no clubbing  PSYCH: alert & oriented x 3, fluent speech NEURO: no focal motor/sensory deficits  LABORATORY DATA:  I have reviewed the data as listed CBC Latest Ref Rng & Units 05/17/2021 05/10/2021 05/03/2021  WBC 4.0 - 10.5 K/uL 6.4 7.5 5.9  Hemoglobin 12.0 - 15.0 g/dL 11.7(L) 11.2(L)  11.1(L)  Hematocrit 36.0 - 46.0 % 34.7(L) 34.1(L) 34.1(L)  Platelets 150 - 400 K/uL 224 258 217    CMP Latest Ref Rng & Units 05/17/2021 05/10/2021 05/03/2021  Glucose 70 - 99 mg/dL 103(H) 91 88  BUN 8 - 23 mg/dL '14 10 9  ' Creatinine 0.44 - 1.00 mg/dL 0.70 0.85 0.75  Sodium 135 - 145 mmol/L 138 138 140  Potassium 3.5 - 5.1 mmol/L 4.1 3.9 4.1  Chloride 98 - 111 mmol/L 108 105 106  CO2 22 - 32 mmol/L '22 26 26  ' Calcium 8.9 - 10.3 mg/dL 9.8 9.4 9.2  Total Protein 6.5 - 8.1 g/dL 6.5 6.4(L) 6.4(L)  Total Bilirubin 0.3 - 1.2 mg/dL 0.7 0.7 0.7  Alkaline Phos 38 - 126 U/L 73 70 75  AST 15 - 41 U/L '19 22 21  ' ALT 0 - 44 U/L '21 25 23    ' Lab Results  Component Value Date   MPROTEIN Not Observed 05/03/2021   MPROTEIN Not Observed 03/29/2021   MPROTEIN 0.1 (H) 03/08/2021   Lab Results  Component Value Date   KPAFRELGTCHN 19.7 (H) 05/03/2021   KPAFRELGTCHN 14.9 04/05/2021   KPAFRELGTCHN 18.1 03/29/2021   LAMBDASER 51.7 (H) 05/03/2021   LAMBDASER 74.6 (H) 04/05/2021   LAMBDASER 94.4 (H) 03/29/2021   KAPLAMBRATIO 6.01 05/03/2021   KAPLAMBRATIO 0.38 05/03/2021   KAPLAMBRATIO 0.20 (L) 04/05/2021    RADIOGRAPHIC STUDIES: No results found.  ASSESSMENT & PLAN Doris Wilkerson 75 y.o. female with medical history significant for lambda light chain multiple myeloma who presents for a follow up visit.   After review the labs, review the records, schedule the patient the findings most consistent with a lambda light chain multiple myeloma.  This is getting confirmed with a bone marrow biopsy showing an abnormal plasma cell population of 28% and M protein/Bence-Jones proteins in the urine.  Given these findings the patient now carries a diagnosis of lambda light chain multiple myeloma.  R-ISS: Stage  II (high risk genetics)  We will proceed with VRd chemotherapy.  This consists of bortezomib 1.3 mg per metered squared q week, lenalidomide 25 mg p.o. x14 days of 21-day cycle, and dexamethasone 40 mg  p.o. q. weekly.  This will be continued until he reached a VGPR/ complete 8 cycles of triplet therapy, at which time we can consider transition over to maintenance Revlimid 10 mg p.o. daily 21 of 28 days per cycle.  Doris Wilkerson is not a candidate for bone marrow transplant based on her advanced age.  # Lambda Light Chain Multiple Myeloma --diagnosis confirmed with bone marrow biopsy and urine protein analysis.   --patient has good functional status and would be an excellent candidate for VRd chemotherapy. I do not believe she would be a bone marrow transplant candidate based on her age.  --assure monthly restaging labs with SPEP, UPEP, and SFLC --patient declines port placement at this time --Cycle 1 Day 1 of therapy started on 02/01/2021.  --today is Cycle 6 Day 1 of VRd --excellent response noted within 1st cycle, normalization of urine free light chains and undetectable M protein.  --can consider transition to maintenance therapy once her SFLC have normalized and she has completed 8 full cycles of triplet therapy. She is rapidly approaching that point.  --RTC in 2 weeks with continued weekly therapy  #Supportive Care --chemotherapy education complete  --patient declines port placement at this time --zofran 32m q8H PRN and compazine 147mPO q6H for nausea --acyclovir 40055mO BID for VCZ prophylaxis --zometa therapy performed on 03/29/2021. Repeat q 3 months.  -- no pain medication required at this time.   No orders of the defined types were placed in this encounter.  All questions were answered. The patient knows to call the clinic with any problems, questions or concerns.  A total of more than 30 minutes were spent on this encounter and over half of that time was spent on counseling and coordination of care as outlined above.   JohLedell PeoplesD Department of Hematology/Oncology [facility] Centerone: [phone]: [phone]:  [email or web address]  05/17/2021 11:34 AM   Literature Support:  SuzBennett Scrapertezomib, lenalidomide, and dexamethasone in transplant-eligible newly diagnosed multiple myeloma patients: a multicenter retrospective comparative analysis. Int J HHinton Dyer020 Jan;111(1):103-111. doi: 10.1007/s12185-019-02764-1.  -- Overall response, very good partial response, and complete response rates after VRD were 96.4%, 45.5%, and 20.0%, respectively (median follow-up period, 17.7 months). The 1-year progression-free survival (PFS) and overall survival rates were 95.8% and 98.2%, respectively. The response rate and PFS were similar between the groups, regardless of cytogenetic risk and age.

## 2021-05-17 ENCOUNTER — Inpatient Hospital Stay: Payer: Medicare Other

## 2021-05-17 ENCOUNTER — Other Ambulatory Visit: Payer: Self-pay

## 2021-05-17 ENCOUNTER — Inpatient Hospital Stay (HOSPITAL_BASED_OUTPATIENT_CLINIC_OR_DEPARTMENT_OTHER): Payer: Medicare Other | Admitting: Hematology and Oncology

## 2021-05-17 VITALS — BP 131/68 | HR 77 | Temp 98.7°F | Resp 17 | Wt 152.7 lb

## 2021-05-17 DIAGNOSIS — C9000 Multiple myeloma not having achieved remission: Secondary | ICD-10-CM

## 2021-05-17 DIAGNOSIS — M899 Disorder of bone, unspecified: Secondary | ICD-10-CM

## 2021-05-17 DIAGNOSIS — D6481 Anemia due to antineoplastic chemotherapy: Secondary | ICD-10-CM

## 2021-05-17 DIAGNOSIS — T451X5A Adverse effect of antineoplastic and immunosuppressive drugs, initial encounter: Secondary | ICD-10-CM | POA: Diagnosis not present

## 2021-05-17 DIAGNOSIS — Z5112 Encounter for antineoplastic immunotherapy: Secondary | ICD-10-CM | POA: Diagnosis not present

## 2021-05-17 LAB — CBC WITH DIFFERENTIAL (CANCER CENTER ONLY)
Abs Immature Granulocytes: 0.01 10*3/uL (ref 0.00–0.07)
Basophils Absolute: 0.1 10*3/uL (ref 0.0–0.1)
Basophils Relative: 1 %
Eosinophils Absolute: 0.2 10*3/uL (ref 0.0–0.5)
Eosinophils Relative: 3 %
HCT: 34.7 % — ABNORMAL LOW (ref 36.0–46.0)
Hemoglobin: 11.7 g/dL — ABNORMAL LOW (ref 12.0–15.0)
Immature Granulocytes: 0 %
Lymphocytes Relative: 7 %
Lymphs Abs: 0.4 10*3/uL — ABNORMAL LOW (ref 0.7–4.0)
MCH: 30.5 pg (ref 26.0–34.0)
MCHC: 33.7 g/dL (ref 30.0–36.0)
MCV: 90.6 fL (ref 80.0–100.0)
Monocytes Absolute: 0.4 10*3/uL (ref 0.1–1.0)
Monocytes Relative: 7 %
Neutro Abs: 5.3 10*3/uL (ref 1.7–7.7)
Neutrophils Relative %: 82 %
Platelet Count: 224 10*3/uL (ref 150–400)
RBC: 3.83 MIL/uL — ABNORMAL LOW (ref 3.87–5.11)
RDW: 15.2 % (ref 11.5–15.5)
WBC Count: 6.4 10*3/uL (ref 4.0–10.5)
nRBC: 0 % (ref 0.0–0.2)

## 2021-05-17 LAB — CMP (CANCER CENTER ONLY)
ALT: 21 U/L (ref 0–44)
AST: 19 U/L (ref 15–41)
Albumin: 3.9 g/dL (ref 3.5–5.0)
Alkaline Phosphatase: 73 U/L (ref 38–126)
Anion gap: 8 (ref 5–15)
BUN: 14 mg/dL (ref 8–23)
CO2: 22 mmol/L (ref 22–32)
Calcium: 9.8 mg/dL (ref 8.9–10.3)
Chloride: 108 mmol/L (ref 98–111)
Creatinine: 0.7 mg/dL (ref 0.44–1.00)
GFR, Estimated: >60 mL/min (ref >60)
Glucose, Bld: 103 mg/dL — ABNORMAL HIGH (ref 70–99)
Potassium: 4.1 mmol/L (ref 3.5–5.1)
Sodium: 138 mmol/L (ref 135–145)
Total Bilirubin: 0.7 mg/dL (ref 0.3–1.2)
Total Protein: 6.5 g/dL (ref 6.5–8.1)

## 2021-05-17 LAB — LACTATE DEHYDROGENASE: LDH: 220 U/L — ABNORMAL HIGH (ref 98–192)

## 2021-05-17 MED ORDER — BORTEZOMIB CHEMO SQ INJECTION 3.5 MG (2.5MG/ML)
1.3000 mg/m2 | Freq: Once | INTRAMUSCULAR | Status: AC
  Administered 2021-05-17: 2.25 mg via SUBCUTANEOUS
  Filled 2021-05-17: qty 0.9

## 2021-05-17 NOTE — Patient Instructions (Signed)
[facility]   ?Discharge Instructions: ?Thank you for choosing [facility] to provide your oncology and hematology care.  ? ?If you have a lab appointment with the Cancer Center, please go directly to the Cancer Center and check in at the registration area. ?  ?Wear comfortable clothing and clothing appropriate for easy access to any Portacath or PICC line.  ? ?We strive to give you quality time with your provider. You may need to reschedule your appointment if you arrive late (15 or more minutes).  Arriving late affects you and other patients whose appointments are after yours.  Also, if you miss three or more appointments without notifying the office, you may be dismissed from the clinic at the provider?s discretion.    ?  ?For prescription refill requests, have your pharmacy contact our office and allow 72 hours for refills to be completed.   ? ?Today you received the following chemotherapy and/or immunotherapy agents: bortezomib    ?  ?To help prevent nausea and vomiting after your treatment, we encourage you to take your nausea medication as directed. ? ?BELOW ARE SYMPTOMS THAT SHOULD BE REPORTED IMMEDIATELY: ?*FEVER GREATER THAN 100.4 F (38 ?C) OR HIGHER ?*CHILLS OR SWEATING ?*NAUSEA AND VOMITING THAT IS NOT CONTROLLED WITH YOUR NAUSEA MEDICATION ?*UNUSUAL SHORTNESS OF BREATH ?*UNUSUAL BRUISING OR BLEEDING ?*URINARY PROBLEMS (pain or burning when urinating, or frequent urination) ?*BOWEL PROBLEMS (unusual diarrhea, constipation, pain near the anus) ?TENDERNESS IN MOUTH AND THROAT WITH OR WITHOUT PRESENCE OF ULCERS (sore throat, sores in mouth, or a toothache) ?UNUSUAL RASH, SWELLING OR PAIN  ?UNUSUAL VAGINAL DISCHARGE OR ITCHING  ? ?Items with * indicate a potential emergency and should be followed up as soon as possible or go to the Emergency Department if any problems should occur. ? ?Please show the CHEMOTHERAPY ALERT CARD or IMMUNOTHERAPY ALERT CARD at check-in  to the Emergency Department and triage nurse. ? ?Should you have questions after your visit or need to cancel or reschedule your appointment, please contact [facility]  Dept: [phone]  and follow the prompts.  Office hours are 8:00 a.m. to 4:30 p.m. Monday - Friday. Please note that voicemails left after 4:00 p.m. may not be returned until the following business day.  We are closed weekends and major holidays. You have access to a nurse at all times for urgent questions. Please call the main number to the clinic Dept: [phone] and follow the prompts. ? ? ?For any non-urgent questions, you may also contact your provider using MyChart. We now offer e-Visits for anyone 18 and older to request care online for non-urgent symptoms. For details visit [email or web address]. ?  ?Also download the MyChart app! Go to the app store, search "MyChart", open the app, select [facility], and log in with your MyChart username and password. ? ?Due to Covid, a mask is required upon entering the hospital/clinic. If you do not have a mask, one will be given to you upon arrival. For doctor visits, patients may have 1 support person aged 18 or older with them. For treatment visits, patients cannot have anyone with them due to current Covid guidelines and our immunocompromised population.  ? ?

## 2021-05-19 ENCOUNTER — Other Ambulatory Visit: Payer: Self-pay | Admitting: Family Medicine

## 2021-05-19 DIAGNOSIS — E782 Mixed hyperlipidemia: Secondary | ICD-10-CM

## 2021-05-19 DIAGNOSIS — I10 Essential (primary) hypertension: Secondary | ICD-10-CM

## 2021-05-22 ENCOUNTER — Other Ambulatory Visit: Payer: Self-pay

## 2021-05-22 ENCOUNTER — Ambulatory Visit: Payer: Medicare Other | Admitting: Physical Therapy

## 2021-05-22 ENCOUNTER — Other Ambulatory Visit: Payer: Self-pay | Admitting: *Deleted

## 2021-05-22 ENCOUNTER — Ambulatory Visit (INDEPENDENT_AMBULATORY_CARE_PROVIDER_SITE_OTHER): Payer: Medicare Other | Admitting: Family Medicine

## 2021-05-22 ENCOUNTER — Encounter: Payer: Self-pay | Admitting: Family Medicine

## 2021-05-22 VITALS — BP 138/80 | HR 78 | Temp 97.8°F | Wt 153.2 lb

## 2021-05-22 DIAGNOSIS — C9000 Multiple myeloma not having achieved remission: Secondary | ICD-10-CM | POA: Diagnosis not present

## 2021-05-22 DIAGNOSIS — R262 Difficulty in walking, not elsewhere classified: Secondary | ICD-10-CM | POA: Diagnosis not present

## 2021-05-22 DIAGNOSIS — E782 Mixed hyperlipidemia: Secondary | ICD-10-CM | POA: Diagnosis not present

## 2021-05-22 DIAGNOSIS — E039 Hypothyroidism, unspecified: Secondary | ICD-10-CM

## 2021-05-22 DIAGNOSIS — Z9181 History of falling: Secondary | ICD-10-CM

## 2021-05-22 DIAGNOSIS — I2510 Atherosclerotic heart disease of native coronary artery without angina pectoris: Secondary | ICD-10-CM

## 2021-05-22 MED ORDER — LENALIDOMIDE 25 MG PO CAPS: 25.0000 mg | ORAL_CAPSULE | Freq: Every day | ORAL | 0 refills | Status: DC

## 2021-05-22 NOTE — Patient Instructions (Addendum)
If blood pressures running higher let me know. No med changes today. Can certainly follow up after repeat bone scan in August if new or persistent areas of pain to decide on other imaging if needed.   Glad to hear you are doing well. Let me know if there are questions.

## 2021-05-22 NOTE — Progress Notes (Signed)
Subjective:  Patient ID: Doris Wilkerson, female    DOB: Oct 15, 1946  Age: 75 y.o. MRN: [id number]  CC:  Chief Complaint  Patient presents with   Follow-up    Bloodwork done by Dr. Lorenso Courier  Needing refill for atorvastatin     HPI [name] presents for   Multiple myeloma Diagnosed in February after MRI of pelvis.  Followed by Dr. Lorenso Courier.  Bone marrow biopsy confirming multiple myeloma with 28% of cells in the aspirate on 01/21/2021.  Treated with chemotherapy, revlimid since March 11.  Cycle 6 05/17/2021.  Treated with physical therapy, intermittent edema, fatigue noted per last appointment June 24 with Dr. Lorenso Courier.  Plan for maintenance therapy once SFLC normalized and has completed 8 full cycles of triplet therapy. Has received Zometa infusion as well.  Doing well.  Weekly PT helping for pain with lytic lesions. Acute pain resolved, some background pain of 3/10. Left hip usually.  Repeat bone scan in August.  Not driving d/t brain fog with chemo.   Hypothyroidism: Lab Results  Component Value Date   TSH 0.735 04/26/2021   Taking medication daily.  25 mcg daily. No new hot or cold intolerance. No new hair or skin changes, heart palpitations or new fatigue. No new weight changes other than side effects with chemo.   Hyperlipidemia: With history of CAD, cardiologist Dr. Einar Gip.  Treated with Lipitor 80 mg daily.no new myalgias or side effects. No CP.  Lab Results  Component Value Date   CHOL 163 05/03/2021   HDL 78 05/03/2021   LDLCALC 69 05/03/2021   TRIG 85 05/03/2021   CHOLHDL 2.1 05/03/2021   Lab Results  Component Value Date   ALT 21 05/17/2021   AST 19 05/17/2021   ALKPHOS 73 05/17/2021   BILITOT 0.7 05/17/2021   Hypertension: Lotensin 10 mg daily. No cough.  Home readings:  none - regular check at treatments.  BP Readings from Last 3 Encounters:  05/22/21 138/80  05/17/21 131/68  05/10/21 121/63   Lab Results  Component Value Date   CREATININE 0.70  05/17/2021        History Patient Active Problem List   Diagnosis Date Noted   Physical debility 03/21/2021   Dizziness 03/21/2021   Vertigo 03/21/2021   Anemia due to antineoplastic chemotherapy 03/21/2021   Skin changes related to chemotherapy 03/21/2021   Other constipation 03/21/2021   Multiple myeloma not having achieved remission ([place]) 01/27/2021   Hordeolum externum of left lower eyelid 06/28/2018   CAD (coronary artery disease) 12/09/2011   Dyslipidemia 12/09/2011   Osteopenia 12/09/2011   Hypothyroid 12/09/2011   Asthma 12/09/2011   Past Medical History:  Diagnosis Date   Asthma    Cataract    GERD (gastroesophageal reflux disease)    Hyperlipidemia    Hypertension    Thyroid disease    Past Surgical History:  Procedure Laterality Date   Cataract Surgery     CORONARY ANGIOPLASTY WITH STENT PLACEMENT     EYE SURGERY     ORTHOPEDIC SURGERY  2012   TONSILLECTOMY  1951   Allergies  Allergen Reactions   Poison Ivy Extract Swelling   Plavix [Clopidogrel Bisulfate]    Other     seasonal   Prior to Admission medications   Medication Sig Start Date End Date Taking? Authorizing Provider  acetaminophen (TYLENOL 8 HOUR ARTHRITIS PAIN) 650 MG CR tablet Take 1 tablet (650 mg total) by mouth every 8 (eight) hours as needed for pain. 01/03/21  Yes Orson Slick, MD  acyclovir (ZOVIRAX) 400 MG tablet Take 1 tablet (400 mg total) by mouth 2 (two) times daily. 01/30/21  Yes Orson Slick, MD  aspirin 81 MG tablet Take 81 mg by mouth daily.    Yes Hayden Rasmussen, MD  atorvastatin (LIPITOR) 80 MG tablet Take 1 tablet (80 mg total) by mouth daily at 6 PM. 06/01/20  Yes [name], MD  benazepril (LOTENSIN) 10 MG tablet TAKE 1 TABLET(10 MG) BY MOUTH DAILY 05/20/21  Yes [name], MD  calcium carbonate (OS-CAL) 1250 (500 Ca) MG chewable tablet Chew 1 tablet by mouth daily.   Yes [provider]  cholecalciferol (VITAMIN D3) 25 MCG (1000 UNIT)  tablet Take 2,000 Units by mouth daily.   Yes [provider]  dexamethasone (DECADRON) 4 MG tablet TAKE 10 TABLETS BY MOUTH ONCE A WEEK 05/13/21  Yes Orson Slick, MD  DM-APAP-CPM (CORICIDIN HBP PO) Take by mouth.    Yes [provider]  famotidine-calcium carbonate-magnesium hydroxide (PEPCID COMPLETE) 10-800-165 MG chewable tablet Chew 1 tablet by mouth daily.   Yes [provider]  lenalidomide (REVLIMID) 25 MG capsule Take 1 capsule (25 mg total) by mouth daily. Take for 14 days; then none for 7 days. Repeat every 21 days [employer] Auth # [id number]  Date Obtained 05/01/21 05/01/21  Yes Orson Slick, MD  levothyroxine (SYNTHROID) 25 MCG tablet Take 1 tablet (25 mcg total) by mouth daily. 04/20/21  Yes [name], MD  ondansetron (ZOFRAN) 8 MG tablet Take 1 tablet (8 mg total) by mouth every 8 (eight) hours as needed for nausea or vomiting. 01/30/21  Yes Orson Slick, MD  polyethylene glycol (MIRALAX / GLYCOLAX) 17 g packet Take 17 g by mouth daily.   Yes [provider]  prochlorperazine (COMPAZINE) 10 MG tablet Take 1 tablet (10 mg total) by mouth every 6 (six) hours as needed for nausea or vomiting. 01/30/21  Yes Orson Slick, MD   Social History   Socioeconomic History   Marital status: Married    Spouse name: Not on file   Number of children: 0   Years of education: Not on file   Highest education level: Not on file  Occupational History   Occupation: unemployed  Tobacco Use   Smoking status: Never   Smokeless tobacco: Never  Vaping Use   Vaping Use: Never used  Substance and Sexual Activity   Alcohol use: Yes    Alcohol/week: 1.0 standard drink    Types: 1 Glasses of wine per week    Comment: occassionally   Drug use: No   Sexual activity: Not Currently  Other Topics Concern   Not on file  Social History Narrative   Married. Education: college. Pt does exercise-   Social Determinants of Health   Financial Resource  Strain: Not on file  Food Insecurity: Not on file  Transportation Needs: Not on file  Physical Activity: Not on file  Stress: Not on file  Social Connections: Not on file  Intimate Partner Violence: Not on file    Review of Systems  Constitutional:  Negative for fatigue (transient with chemo as above.) and unexpected weight change.  Eyes:  Positive for visual disturbance (with chemo.).  Respiratory:  Negative for chest tightness and shortness of breath.   Cardiovascular:  Positive for leg swelling (transient with chemo). Negative for chest pain and palpitations.  Gastrointestinal:  Negative for  abdominal pain and blood in stool.  Neurological:  Negative for dizziness, syncope, light-headedness and headaches.    Objective:   Vitals:   05/22/21 1012  BP: 138/80  Pulse: 78  Temp: 97.8 F (36.6 C)  TempSrc: Temporal  SpO2: 98%  Weight: 153 lb 3.2 oz (69.5 kg)     Physical Exam Vitals reviewed.  Constitutional:      Appearance: Normal appearance. She is well-developed.  HENT:     Head: Normocephalic and atraumatic.  Eyes:     Conjunctiva/sclera: Conjunctivae normal.     Pupils: Pupils are equal, round, and reactive to light.  Neck:     Vascular: No carotid bruit.  Cardiovascular:     Rate and Rhythm: Normal rate and regular rhythm.     Heart sounds: Normal heart sounds.  Pulmonary:     Effort: Pulmonary effort is normal.     Breath sounds: Normal breath sounds.  Abdominal:     Palpations: Abdomen is soft. There is no pulsatile mass.     Tenderness: There is no abdominal tenderness.  Musculoskeletal:     Right lower leg: No edema.     Left lower leg: No edema.  Skin:    General: Skin is warm and dry.  Neurological:     Mental Status: She is alert and oriented to person, place, and time.  Psychiatric:        Mood and Affect: Mood normal.        Behavior: Behavior normal.       Assessment & Plan:  Marnae Madani is a 75 y.o. female . Multiple myeloma  not having achieved remission ([place])  -Tolerating treatments, plan as above regarding maintenance therapy, and plan for follow-up scanning.  If new areas of pain or persistent areas of pain, advised follow-up to decide on specific imaging of that area.  Continue follow-up with hematologist.  Hypothyroidism, unspecified type  -Stable on recent testing, no changes.  Coronary artery disease involving native coronary artery of native heart without angina pectoris Mixed hyperlipidemia  -Tolerating statin, continue same.  Asymptomatic.  Borderline glucose noted on recent labs, can recheck at follow-up visit.  -Additionally discussed monitoring blood pressures, RTC precautions if elevated.  No orders of the defined types were placed in this encounter.  Patient Instructions  If blood pressures running higher let me know. No med changes today. Can certainly follow up after repeat bone scan in August if new or persistent areas of pain to decide on other imaging if needed.   Glad to hear you are doing well. Let me know if there are questions.     Signed,   [name], MD [place] 05/22/21 8:50 PM

## 2021-05-22 NOTE — Therapy (Signed)
[facility] [address], [place], [place], [address] Phone: [phone]   Fax:  [phone]  Physical Therapy Treatment  Patient Details  Name: Doris Wilkerson MRN: [id number] Date of Birth: 03-17-1946 Referring Provider (PT): Narda Rutherford, MD   Encounter Date: 05/22/2021  Progress Note Reporting Period 03/26/2021 to 05/22/2021  See note below for Objective Data and Assessment of Progress/Goals.       PT End of Session - 05/22/21 1450     Visit Number 8    Date for PT Re-Evaluation 07/19/21    Authorization Type Medicare    Authorization Time Period KX at 15 visits    Progress Note Due on Visit 18    PT Start Time 1400    PT Stop Time 1439    PT Time Calculation (min) 39 min    Activity Tolerance Patient tolerated treatment well;No increased pain    Behavior During Therapy WFL for tasks assessed/performed             Past Medical History:  Diagnosis Date   Asthma    Cataract    GERD (gastroesophageal reflux disease)    Hyperlipidemia    Hypertension    Thyroid disease     Past Surgical History:  Procedure Laterality Date   Cataract Surgery     CORONARY ANGIOPLASTY WITH STENT PLACEMENT     EYE SURGERY     ORTHOPEDIC SURGERY  2012   TONSILLECTOMY  1951    There were no vitals filed for this visit.   Subjective Assessment - 05/22/21 1404     Subjective Has been having some back pain when bending to lift her 13# dog.    Pertinent History multiple myeloma (current)    Limitations Sitting;Walking;Standing    How long can you sit comfortably? 20 minutes with extra cushioning    How long can you stand comfortably? unlimited    How long can you walk comfortably? 15-20 minutes    Patient Stated Goals more mobile; decreased falls; get through remainder of chemotherapy (8 more weeks)    Currently in Pain? No/denies                [facility] PT Assessment - 05/22/21 0001       Assessment    Medical Diagnosis R53.81 (ICD-10-CM) - Physical debility    Referring Provider (PT) Narda Rutherford, MD    Onset Date/Surgical Date 12/19/20    Hand Dominance Right    Next MD Visit None    Prior Therapy Yes      Precautions   Precautions Other (comment)    Precaution Comments actively in chemo; bone METS to lumbar spine and Lt pelvis      Restrictions   Weight Bearing Restrictions No      Balance Screen   Has the patient fallen in the past 6 months Yes    How many times? multiple    Has the patient had a decrease in activity level because of a fear of falling?  No    Is the patient reluctant to leave their home because of a fear of falling?  No      Home Environment   Living Environment Private residence    Living Arrangements Spouse/significant other      Prior Function   Level of Independence Independent      Cognition   Overall Cognitive Status Within Functional Limits for tasks assessed      Observation/Other Assessments  Focus on Therapeutic Outcomes (FOTO)  49 (goal 60)      Strength   Overall Strength Comments hip adduction 4+/5      Transfers   Five time sit to stand comments  10.5s      6 minute walk test results    Aerobic Endurance Distance Walked 1190    Endurance additional comments RPE 4/10      Standardized Balance Assessment   Standardized Balance Assessment Dynamic Gait Index      Dynamic Gait Index   Level Surface Normal    Change in Gait Speed Normal    Gait with Horizontal Head Turns Mild Impairment    Gait with Vertical Head Turns Mild Impairment    Gait and Pivot Turn Mild Impairment    Step Over Obstacle Mild Impairment    Step Around Obstacles Normal    Steps Normal    Total Score 20                           [facility] Adult PT Treatment/Exercise - 05/22/21 0001       Lumbar Exercises: Standing   Other Standing Lumbar Exercises Pallof: power tower, 30#, x15 Lt/Rt      Lumbar Exercises: Seated   Other Seated Lumbar  Exercises blue swiss ball: power tower row 25# x10      Lumbar Exercises: Quadruped   Opposite Arm/Leg Raise Right arm/Left leg;Left arm/Right leg;5 reps;3 seconds    Opposite Arm/Leg Raise Limitations standing at raised table                      PT Short Term Goals - 05/22/21 1416       PT SHORT TERM GOAL #1   Title Patient will be independent with HEP for continued progression at home.    Time 4    Period Weeks    Status Achieved    Target Date 04/23/21      PT SHORT TERM GOAL #2   Title Patient will complete 6 minute walk test without use of AD with minimal gait impairments and no reports of increased bone pain.    Time 4    Period Weeks    Status Achieved   1190 feet; RPE 4/10; no increased pain   Target Date 04/23/21               PT Long Term Goals - 05/22/21 1417       PT LONG TERM GOAL #1   Title Patient will improve FOTO score to 60 to indicate improved overall function.    Time 8    Period Weeks    Status On-going   49     PT LONG TERM GOAL #2   Title Patient will be independent with advanced HEP for long term management of symptoms post D/C.    Time 8    Period Weeks    Status On-going      PT LONG TERM GOAL #3   Title Patient will complete five time sit to stand in </= 13s to indicate decreased fall risk.    Baseline 18s    Time 8    Period Weeks    Status Achieved   10.5 seconds     PT LONG TERM GOAL #4   Title Patient will score >/= 22/24 on DGI to indicate decreased fall risk.    Baseline 20    Time 8  Period Weeks    Status New    Target Date 07/17/21                   Plan - 05/22/21 1444     Clinical Impression Statement Patient is a 75 y/o female referred due to physical debility secondary to multiple myeloma. Patient subjectively reports significantly improved functional mobility and activity tolerance. She complete 5x sit to stand in 10.5s indicating improved functional mobility and decreased fall risk. Bil  LE strength improved as hip adduction strength 4+/5. However deficits continue to persist as patient did not meet FOTO goal indicating continued overall functional impairments. Additionally, patient scoring 20/24 on DGI indicating continued fall risk. Patient would benefit from continued skilled intervention to address impairments for improved overall function and decreased fall risk.    Personal Factors and Comorbidities Comorbidity 1    Comorbidities Cancer (current)    Examination-Activity Limitations Sit;Locomotion Level;Transfers    Examination-Participation Restrictions Community Activity;Cleaning    Stability/Clinical Decision Making Evolving/Moderate complexity    Clinical Decision Making Moderate    Rehab Potential Good    PT Frequency 1x / week    PT Duration 8 weeks    PT Treatment/Interventions ADLs/Self Care Home Management;Aquatic Therapy;Gait training;Functional mobility training;Therapeutic activities;Therapeutic exercise;Balance training;Neuromuscular re-education;Patient/family education    PT Next Visit Plan continue static and dynamic balance as well as global strengthening adding functional component when appropriate    PT Home Exercise Plan Access Code [email or web address]    Consulted and Agree with Plan of Care Patient             Patient will benefit from skilled therapeutic intervention in order to improve the following deficits and impairments:  Decreased activity tolerance, Abnormal gait, Difficulty walking, Pain  Visit Diagnosis: Difficulty in walking, not elsewhere classified - Plan: PT plan of care cert/re-cert  History of falling - Plan: PT plan of care cert/re-cert     Problem List Patient Active Problem List   Diagnosis Date Noted   Physical debility 03/21/2021   Dizziness 03/21/2021   Vertigo 03/21/2021   Anemia due to antineoplastic chemotherapy 03/21/2021   Skin changes related to chemotherapy 03/21/2021   Other constipation 03/21/2021   Multiple  myeloma not having achieved remission ([place]) 01/27/2021   Hordeolum externum of left lower eyelid 06/28/2018   CAD (coronary artery disease) 12/09/2011   Dyslipidemia 12/09/2011   Osteopenia 12/09/2011   Hypothyroid 12/09/2011   Asthma 12/09/2011    Everardo All PT, DPT  05/22/21 2:54 PM   [facility] [address], [place], [place], [address] Phone: [phone]   Fax:  [phone]  Name: Doris Wilkerson MRN: [id number] Date of Birth: 1946-09-18

## 2021-05-24 ENCOUNTER — Inpatient Hospital Stay: Payer: Medicare Other

## 2021-05-24 ENCOUNTER — Inpatient Hospital Stay: Payer: Medicare Other | Attending: Hematology and Oncology

## 2021-05-24 ENCOUNTER — Other Ambulatory Visit: Payer: Self-pay

## 2021-05-24 ENCOUNTER — Encounter: Payer: Self-pay | Admitting: Hematology and Oncology

## 2021-05-24 VITALS — BP 133/60 | HR 72 | Temp 98.2°F | Resp 18

## 2021-05-24 DIAGNOSIS — I10 Essential (primary) hypertension: Secondary | ICD-10-CM | POA: Diagnosis not present

## 2021-05-24 DIAGNOSIS — C9000 Multiple myeloma not having achieved remission: Secondary | ICD-10-CM | POA: Insufficient documentation

## 2021-05-24 DIAGNOSIS — Z5112 Encounter for antineoplastic immunotherapy: Secondary | ICD-10-CM | POA: Diagnosis not present

## 2021-05-24 DIAGNOSIS — Z79899 Other long term (current) drug therapy: Secondary | ICD-10-CM | POA: Diagnosis not present

## 2021-05-24 LAB — CBC WITH DIFFERENTIAL (CANCER CENTER ONLY)
Abs Immature Granulocytes: 0.02 10*3/uL (ref 0.00–0.07)
Basophils Absolute: 0.1 10*3/uL (ref 0.0–0.1)
Basophils Relative: 1 %
Eosinophils Absolute: 0.7 10*3/uL — ABNORMAL HIGH (ref 0.0–0.5)
Eosinophils Relative: 15 %
HCT: 34.3 % — ABNORMAL LOW (ref 36.0–46.0)
Hemoglobin: 11.6 g/dL — ABNORMAL LOW (ref 12.0–15.0)
Immature Granulocytes: 0 %
Lymphocytes Relative: 16 %
Lymphs Abs: 0.7 10*3/uL (ref 0.7–4.0)
MCH: 30.7 pg (ref 26.0–34.0)
MCHC: 33.8 g/dL (ref 30.0–36.0)
MCV: 90.7 fL (ref 80.0–100.0)
Monocytes Absolute: 0.5 10*3/uL (ref 0.1–1.0)
Monocytes Relative: 11 %
Neutro Abs: 2.5 10*3/uL (ref 1.7–7.7)
Neutrophils Relative %: 57 %
Platelet Count: 202 10*3/uL (ref 150–400)
RBC: 3.78 MIL/uL — ABNORMAL LOW (ref 3.87–5.11)
RDW: 15.5 % (ref 11.5–15.5)
WBC Count: 4.5 10*3/uL (ref 4.0–10.5)
nRBC: 0 % (ref 0.0–0.2)

## 2021-05-24 LAB — CMP (CANCER CENTER ONLY)
ALT: 25 U/L (ref 0–44)
AST: 21 U/L (ref 15–41)
Albumin: 4.2 g/dL (ref 3.5–5.0)
Alkaline Phosphatase: 57 U/L (ref 38–126)
Anion gap: 6 (ref 5–15)
BUN: 13 mg/dL (ref 8–23)
CO2: 24 mmol/L (ref 22–32)
Calcium: 9.2 mg/dL (ref 8.9–10.3)
Chloride: 106 mmol/L (ref 98–111)
Creatinine: 0.57 mg/dL (ref 0.44–1.00)
GFR, Estimated: >60 mL/min (ref >60)
Glucose, Bld: 94 mg/dL (ref 70–99)
Potassium: 4.3 mmol/L (ref 3.5–5.1)
Sodium: 136 mmol/L (ref 135–145)
Total Bilirubin: 0.8 mg/dL (ref 0.3–1.2)
Total Protein: 6.3 g/dL — ABNORMAL LOW (ref 6.5–8.1)

## 2021-05-24 LAB — LACTATE DEHYDROGENASE: LDH: 176 U/L (ref 98–192)

## 2021-05-24 MED ORDER — BORTEZOMIB CHEMO SQ INJECTION 3.5 MG (2.5MG/ML)
1.3000 mg/m2 | Freq: Once | INTRAMUSCULAR | Status: AC
  Administered 2021-05-24: 2.25 mg via SUBCUTANEOUS
  Filled 2021-05-24: qty 0.9

## 2021-05-24 NOTE — Patient Instructions (Signed)
[facility]   ?Discharge Instructions: ?Thank you for choosing [facility] to provide your oncology and hematology care.  ? ?If you have a lab appointment with the Cancer Center, please go directly to the Cancer Center and check in at the registration area. ?  ?Wear comfortable clothing and clothing appropriate for easy access to any Portacath or PICC line.  ? ?We strive to give you quality time with your provider. You may need to reschedule your appointment if you arrive late (15 or more minutes).  Arriving late affects you and other patients whose appointments are after yours.  Also, if you miss three or more appointments without notifying the office, you may be dismissed from the clinic at the provider?s discretion.    ?  ?For prescription refill requests, have your pharmacy contact our office and allow 72 hours for refills to be completed.   ? ?Today you received the following chemotherapy and/or immunotherapy agents: bortezomib    ?  ?To help prevent nausea and vomiting after your treatment, we encourage you to take your nausea medication as directed. ? ?BELOW ARE SYMPTOMS THAT SHOULD BE REPORTED IMMEDIATELY: ?*FEVER GREATER THAN 100.4 F (38 ?C) OR HIGHER ?*CHILLS OR SWEATING ?*NAUSEA AND VOMITING THAT IS NOT CONTROLLED WITH YOUR NAUSEA MEDICATION ?*UNUSUAL SHORTNESS OF BREATH ?*UNUSUAL BRUISING OR BLEEDING ?*URINARY PROBLEMS (pain or burning when urinating, or frequent urination) ?*BOWEL PROBLEMS (unusual diarrhea, constipation, pain near the anus) ?TENDERNESS IN MOUTH AND THROAT WITH OR WITHOUT PRESENCE OF ULCERS (sore throat, sores in mouth, or a toothache) ?UNUSUAL RASH, SWELLING OR PAIN  ?UNUSUAL VAGINAL DISCHARGE OR ITCHING  ? ?Items with * indicate a potential emergency and should be followed up as soon as possible or go to the Emergency Department if any problems should occur. ? ?Please show the CHEMOTHERAPY ALERT CARD or IMMUNOTHERAPY ALERT CARD at check-in  to the Emergency Department and triage nurse. ? ?Should you have questions after your visit or need to cancel or reschedule your appointment, please contact [facility]  Dept: [phone]  and follow the prompts.  Office hours are 8:00 a.m. to 4:30 p.m. Monday - Friday. Please note that voicemails left after 4:00 p.m. may not be returned until the following business day.  We are closed weekends and major holidays. You have access to a nurse at all times for urgent questions. Please call the main number to the clinic Dept: [phone] and follow the prompts. ? ? ?For any non-urgent questions, you may also contact your provider using MyChart. We now offer e-Visits for anyone 18 and older to request care online for non-urgent symptoms. For details visit [email or web address]. ?  ?Also download the MyChart app! Go to the app store, search "MyChart", open the app, select [facility], and log in with your MyChart username and password. ? ?Due to Covid, a mask is required upon entering the hospital/clinic. If you do not have a mask, one will be given to you upon arrival. For doctor visits, patients may have 1 support person aged 18 or older with them. For treatment visits, patients cannot have anyone with them due to current Covid guidelines and our immunocompromised population.  ? ?

## 2021-05-28 LAB — KAPPA/LAMBDA LIGHT CHAINS
Kappa free light chain: 16.1 mg/L (ref 3.3–19.4)
Kappa, lambda light chain ratio: 0.48 (ref 0.26–1.65)
Lambda free light chains: 33.8 mg/L — ABNORMAL HIGH (ref 5.7–26.3)

## 2021-05-28 LAB — MULTIPLE MYELOMA PANEL, SERUM
Albumin SerPl Elph-Mcnc: 3.6 g/dL (ref 2.9–4.4)
Albumin/Glob SerPl: 1.9 — ABNORMAL HIGH (ref 0.7–1.7)
Alpha 1: 0.2 g/dL (ref 0.0–0.4)
Alpha2 Glob SerPl Elph-Mcnc: 0.7 g/dL (ref 0.4–1.0)
B-Globulin SerPl Elph-Mcnc: 0.8 g/dL (ref 0.7–1.3)
Gamma Glob SerPl Elph-Mcnc: 0.3 g/dL — ABNORMAL LOW (ref 0.4–1.8)
Globulin, Total: 2 g/dL — ABNORMAL LOW (ref 2.2–3.9)
IgA: 66 mg/dL (ref 64–422)
IgG (Immunoglobin G), Serum: 392 mg/dL — ABNORMAL LOW (ref 586–1602)
IgM (Immunoglobulin M), Srm: 20 mg/dL — ABNORMAL LOW (ref 26–217)
Total Protein ELP: 5.6 g/dL — ABNORMAL LOW (ref 6.0–8.5)

## 2021-05-31 ENCOUNTER — Other Ambulatory Visit: Payer: Self-pay

## 2021-05-31 ENCOUNTER — Inpatient Hospital Stay (HOSPITAL_BASED_OUTPATIENT_CLINIC_OR_DEPARTMENT_OTHER): Payer: Medicare Other | Admitting: Hematology and Oncology

## 2021-05-31 ENCOUNTER — Inpatient Hospital Stay: Payer: Medicare Other

## 2021-05-31 ENCOUNTER — Encounter: Payer: Self-pay | Admitting: Hematology and Oncology

## 2021-05-31 VITALS — BP 124/59 | HR 67 | Temp 98.6°F | Resp 18 | Ht 66.0 in | Wt 152.2 lb

## 2021-05-31 DIAGNOSIS — Z79899 Other long term (current) drug therapy: Secondary | ICD-10-CM | POA: Diagnosis not present

## 2021-05-31 DIAGNOSIS — C9000 Multiple myeloma not having achieved remission: Secondary | ICD-10-CM

## 2021-05-31 DIAGNOSIS — D6481 Anemia due to antineoplastic chemotherapy: Secondary | ICD-10-CM

## 2021-05-31 DIAGNOSIS — I10 Essential (primary) hypertension: Secondary | ICD-10-CM | POA: Diagnosis not present

## 2021-05-31 DIAGNOSIS — M899 Disorder of bone, unspecified: Secondary | ICD-10-CM

## 2021-05-31 DIAGNOSIS — T451X5A Adverse effect of antineoplastic and immunosuppressive drugs, initial encounter: Secondary | ICD-10-CM

## 2021-05-31 DIAGNOSIS — Z5112 Encounter for antineoplastic immunotherapy: Secondary | ICD-10-CM | POA: Diagnosis not present

## 2021-05-31 LAB — CMP (CANCER CENTER ONLY)
ALT: 27 U/L (ref 0–44)
AST: 20 U/L (ref 15–41)
Albumin: 3.9 g/dL (ref 3.5–5.0)
Alkaline Phosphatase: 64 U/L (ref 38–126)
Anion gap: 9 (ref 5–15)
BUN: 14 mg/dL (ref 8–23)
CO2: 24 mmol/L (ref 22–32)
Calcium: 9.2 mg/dL (ref 8.9–10.3)
Chloride: 108 mmol/L (ref 98–111)
Creatinine: 0.74 mg/dL (ref 0.44–1.00)
GFR, Estimated: >60 mL/min (ref >60)
Glucose, Bld: 97 mg/dL (ref 70–99)
Potassium: 4.2 mmol/L (ref 3.5–5.1)
Sodium: 141 mmol/L (ref 135–145)
Total Bilirubin: 1 mg/dL (ref 0.3–1.2)
Total Protein: 6.2 g/dL — ABNORMAL LOW (ref 6.5–8.1)

## 2021-05-31 LAB — CBC WITH DIFFERENTIAL (CANCER CENTER ONLY)
Abs Immature Granulocytes: 0.02 10*3/uL (ref 0.00–0.07)
Basophils Absolute: 0.1 10*3/uL (ref 0.0–0.1)
Basophils Relative: 1 %
Eosinophils Absolute: 0.3 10*3/uL (ref 0.0–0.5)
Eosinophils Relative: 3 %
HCT: 34.3 % — ABNORMAL LOW (ref 36.0–46.0)
Hemoglobin: 11.4 g/dL — ABNORMAL LOW (ref 12.0–15.0)
Immature Granulocytes: 0 %
Lymphocytes Relative: 6 %
Lymphs Abs: 0.5 10*3/uL — ABNORMAL LOW (ref 0.7–4.0)
MCH: 30.4 pg (ref 26.0–34.0)
MCHC: 33.2 g/dL (ref 30.0–36.0)
MCV: 91.5 fL (ref 80.0–100.0)
Monocytes Absolute: 0.7 10*3/uL (ref 0.1–1.0)
Monocytes Relative: 8 %
Neutro Abs: 6.4 10*3/uL (ref 1.7–7.7)
Neutrophils Relative %: 82 %
Platelet Count: 229 10*3/uL (ref 150–400)
RBC: 3.75 MIL/uL — ABNORMAL LOW (ref 3.87–5.11)
RDW: 15.2 % (ref 11.5–15.5)
WBC Count: 7.9 10*3/uL (ref 4.0–10.5)
nRBC: 0 % (ref 0.0–0.2)

## 2021-05-31 LAB — LACTATE DEHYDROGENASE: LDH: 180 U/L (ref 98–192)

## 2021-05-31 MED ORDER — BORTEZOMIB CHEMO SQ INJECTION 3.5 MG (2.5MG/ML)
1.3000 mg/m2 | Freq: Once | INTRAMUSCULAR | Status: AC
  Administered 2021-05-31: 2.25 mg via SUBCUTANEOUS
  Filled 2021-05-31: qty 0.9

## 2021-05-31 NOTE — Progress Notes (Signed)
[place] Telephone:([phone]) [phone]   [phone]) [phone]  PROGRESS NOTE  Patient Care Team: Doris Agreste, MD as PCP - General (Family Medicine) [name], MD as Consulting Physician (Cardiology) [name], MD (Ophthalmology)  Hematological/Oncological History # Lambda Light Chain Multiple Myeloma 12/26/2020: MRI of pelvis showed lytic lesions on L4, L5, the sacrum, with pathologic fracture of left inferior pubic ramus. Concerning for multiple myeloma vs metastatic disease 12/31/2020: establish care with Dr. Lorenso Courier. UPEP showed marked Bence Jones protein, findings concerning for multiple myleoma 01/21/2021: bone marrow biopsy confirms multiple myeloma with atypical plasma cells representing 28% of all cells in the aspirate  02/01/2021: Cycle 1 Day 1 of VRd chemotherapy 02/22/2021: Cycle 2 Day 1 of VRd chemotherapy 03/15/2021: Cycle 3 Day 1 of VRd chemotherapy 04/05/2021: Cycle 4 Day 1 of VRd chemotherapy 04/26/2021: Cycle 5 Day 1 of VRd chemotherapy 05/17/2021: Cycle 6 Day 1 of VRd chemotherapy 06/07/2021: Anticipated Cycle 7 Day 1 of VRd chemotherapy  Interval History:  Doris Wilkerson 75 y.o. female with medical history significant for lambda light chain multiple myeloma who presents for a follow up visit. The patient's last visit was on 05/17/2021. In the interim since the last visit she continued on VRd chemotherapy.   On exam today Doris Wilkerson reports she has been well in the interim since our last visit.  She notes that she has been more fatigued the cycle that she has been previously.  She notes that she does have some loose stools that can occasionally watery about 1-2 times per day.  She also notes a bloating sensation that she has been feeling.  Her appetite has been fluctuating as well.  She is also concerned that she is having trouble with her vision and is not able to see the crossword puzzles as well as she had been previously.  She endorses having some occasional  headaches and thinks that she may be performing her sudoku puzzles slower than usual.  Otherwise her weight has been stable.  She notes that she is ambulating well and otherwise has no questions concerns or complaints.  She denies any fevers, chills, sweats, nausea, vomiting or diarrhea. A full 10 point ROS is listed below.   MEDICAL HISTORY:  Past Medical History:  Diagnosis Date   Asthma    Cataract    GERD (gastroesophageal reflux disease)    Hyperlipidemia    Hypertension    Thyroid disease     SURGICAL HISTORY: Past Surgical History:  Procedure Laterality Date   Cataract Surgery     CORONARY ANGIOPLASTY WITH STENT PLACEMENT     EYE SURGERY     ORTHOPEDIC SURGERY  2012   TONSILLECTOMY  1951    SOCIAL HISTORY: Social History   Socioeconomic History   Marital status: Married    Spouse name: Not on file   Number of children: 0   Years of education: Not on file   Highest education level: Not on file  Occupational History   Occupation: unemployed  Tobacco Use   Smoking status: Never   Smokeless tobacco: Never  Vaping Use   Vaping Use: Never used  Substance and Sexual Activity   Alcohol use: Yes    Alcohol/week: 1.0 standard drink    Types: 1 Glasses of wine per week    Comment: occassionally   Drug use: No   Sexual activity: Not Currently  Other Topics Concern   Not on file  Social History Narrative   Married. Education: college.  Pt does exercise-   Social Determinants of Health   Financial Resource Strain: Not on file  Food Insecurity: Not on file  Transportation Needs: Not on file  Physical Activity: Not on file  Stress: Not on file  Social Connections: Not on file  Intimate Partner Violence: Not on file    FAMILY HISTORY: Family History  Problem Relation Age of Onset   Cancer Father    Heart disease Brother    Dementia Brother    Leukemia Brother    Breast cancer Neg Hx    Colon cancer Neg Hx    Esophageal cancer Neg Hx    Pancreatic cancer  Neg Hx    Rectal cancer Neg Hx    Stomach cancer Neg Hx     ALLERGIES:  is allergic to poison ivy extract, plavix [clopidogrel bisulfate], and other.  MEDICATIONS:  Current Outpatient Medications  Medication Sig Dispense Refill   acetaminophen (TYLENOL 8 HOUR ARTHRITIS PAIN) 650 MG CR tablet Take 1 tablet (650 mg total) by mouth every 8 (eight) hours as needed for pain.     acyclovir (ZOVIRAX) 400 MG tablet Take 1 tablet (400 mg total) by mouth 2 (two) times daily. 60 tablet 5   aspirin 81 MG tablet Take 81 mg by mouth daily.      atorvastatin (LIPITOR) 80 MG tablet Take 1 tablet (80 mg total) by mouth daily at 6 PM. 90 tablet 3   benazepril (LOTENSIN) 10 MG tablet TAKE 1 TABLET(10 MG) BY MOUTH DAILY 90 tablet 1   calcium carbonate (OS-CAL) 1250 (500 Ca) MG chewable tablet Chew 1 tablet by mouth daily.     cholecalciferol (VITAMIN D3) 25 MCG (1000 UNIT) tablet Take 2,000 Units by mouth daily.     dexamethasone (DECADRON) 4 MG tablet TAKE 10 TABLETS BY MOUTH ONCE A WEEK 40 tablet 3   DM-APAP-CPM (CORICIDIN HBP PO) Take by mouth.      famotidine-calcium carbonate-magnesium hydroxide (PEPCID COMPLETE) 10-800-165 MG chewable tablet Chew 1 tablet by mouth daily.     lenalidomide (REVLIMID) 25 MG capsule Take 1 capsule (25 mg total) by mouth daily. Take for 14 days; then none for 7 days. Repeat every 21 days [employer] Auth # [id number]  Date Obtained 05/22/21 14 capsule 0   levothyroxine (SYNTHROID) 25 MCG tablet Take 1 tablet (25 mcg total) by mouth daily. 90 tablet 0   ondansetron (ZOFRAN) 8 MG tablet Take 1 tablet (8 mg total) by mouth every 8 (eight) hours as needed for nausea or vomiting. 30 tablet 0   polyethylene glycol (MIRALAX / GLYCOLAX) 17 g packet Take 17 g by mouth daily.     prochlorperazine (COMPAZINE) 10 MG tablet Take 1 tablet (10 mg total) by mouth every 6 (six) hours as needed for nausea or vomiting. 30 tablet 0   No current facility-administered medications for this visit.     REVIEW OF SYSTEMS:   Constitutional: ( - ) fevers, ( - )  chills , ( - ) night sweats Eyes: ( - ) blurriness of vision, ( - ) double vision, ( - ) watery eyes Ears, nose, mouth, throat, and face: ( - ) mucositis, ( - ) sore throat Respiratory: ( - ) cough, ( - ) dyspnea, ( - ) wheezes Cardiovascular: ( - ) palpitation, ( - ) chest discomfort, ( - ) lower extremity swelling Gastrointestinal:  ( - ) nausea, ( - ) heartburn, ( - ) change in bowel habits Skin: ( - ) abnormal skin  rashes Lymphatics: ( - ) new lymphadenopathy, ( - ) easy bruising Neurological: ( - ) numbness, ( - ) tingling, ( - ) new weaknesses Behavioral/Psych: ( - ) mood change, ( - ) new changes  All other systems were reviewed with the patient and are negative.  PHYSICAL EXAMINATION: ECOG PERFORMANCE STATUS: 1 - Symptomatic but completely ambulatory  Vitals:   05/31/21 1120  BP: (!) 124/59  Pulse: 67  Resp: 18  Temp: 98.6 F (37 C)  SpO2: 100%    Filed Weights   05/31/21 1120  Weight: 152 lb 3.2 oz (69 kg)     GENERAL: well appearing elderly Caucasian female in NAD  SKIN: skin color, texture, turgor are normal, no rashes or significant lesions EYES: conjunctiva are pink and non-injected, sclera clear LUNGS: clear to auscultation and percussion with normal breathing effort HEART: regular rate & rhythm and no murmurs and trace lower extremity edema Musculoskeletal: no cyanosis of digits and no clubbing  PSYCH: alert & oriented x 3, fluent speech NEURO: no focal motor/sensory deficits  LABORATORY DATA:  I have reviewed the data as listed CBC Latest Ref Rng & Units 05/31/2021 05/24/2021 05/17/2021  WBC 4.0 - 10.5 K/uL 7.9 4.5 6.4  Hemoglobin 12.0 - 15.0 g/dL 11.4(L) 11.6(L) 11.7(L)  Hematocrit 36.0 - 46.0 % 34.3(L) 34.3(L) 34.7(L)  Platelets 150 - 400 K/uL 229 202 224    CMP Latest Ref Rng & Units 05/31/2021 05/24/2021 05/17/2021  Glucose 70 - 99 mg/dL 97 94 103(H)  BUN 8 - 23 mg/dL '14 13 14  ' Creatinine  0.44 - 1.00 mg/dL 0.74 0.57 0.70  Sodium 135 - 145 mmol/L 141 136 138  Potassium 3.5 - 5.1 mmol/L 4.2 4.3 4.1  Chloride 98 - 111 mmol/L 108 106 108  CO2 22 - 32 mmol/L '24 24 22  ' Calcium 8.9 - 10.3 mg/dL 9.2 9.2 9.8  Total Protein 6.5 - 8.1 g/dL 6.2(L) 6.3(L) 6.5  Total Bilirubin 0.3 - 1.2 mg/dL 1.0 0.8 0.7  Alkaline Phos 38 - 126 U/L 64 57 73  AST 15 - 41 U/L '20 21 19  ' ALT 0 - 44 U/L '27 25 21    ' Lab Results  Component Value Date   MPROTEIN Not Observed 05/24/2021   MPROTEIN Not Observed 05/03/2021   MPROTEIN Not Observed 03/29/2021   Lab Results  Component Value Date   KPAFRELGTCHN 16.1 05/24/2021   KPAFRELGTCHN 19.7 (H) 05/03/2021   KPAFRELGTCHN 14.9 04/05/2021   LAMBDASER 33.8 (H) 05/24/2021   LAMBDASER 51.7 (H) 05/03/2021   LAMBDASER 74.6 (H) 04/05/2021   KAPLAMBRATIO 0.48 05/24/2021   KAPLAMBRATIO 6.01 05/03/2021   KAPLAMBRATIO 0.38 05/03/2021    RADIOGRAPHIC STUDIES: No results found.  ASSESSMENT & PLAN Aissata Wilmore 75 y.o. female with medical history significant for lambda light chain multiple myeloma who presents for a follow up visit.   After review the labs, review the records, schedule the patient the findings most consistent with a lambda light chain multiple myeloma.  This is getting confirmed with a bone marrow biopsy showing an abnormal plasma cell population of 28% and M protein/Bence-Jones proteins in the urine.  Given these findings the patient now carries a diagnosis of lambda light chain multiple myeloma.  R-ISS: Stage II (high risk genetics)  We will proceed with VRd chemotherapy.  This consists of bortezomib 1.3 mg per metered squared q week, lenalidomide 25 mg p.o. x14 days of 21-day cycle, and dexamethasone 40 mg p.o. q. weekly.  This will be continued  until he reached a VGPR/ complete 8 cycles of triplet therapy, at which time we can consider transition over to maintenance Revlimid 10 mg p.o. daily 21 of 28 days per cycle.  Ms. Morgan is not a  candidate for bone marrow transplant based on her advanced age.  # Lambda Light Chain Multiple Myeloma --diagnosis confirmed with bone marrow biopsy and urine protein analysis.   --patient has good functional status and would be an excellent candidate for VRd chemotherapy. I do not believe she would be a bone marrow transplant candidate based on her age.  --assure monthly restaging labs with SPEP, UPEP, and SFLC --patient declines port placement at this time --Cycle 1 Day 1 of therapy started on 02/01/2021.  --today is Cycle 6 Day 15 of VRd --excellent response noted within 1st cycle, normalization of urine free light chains and undetectable M protein.  --can consider transition to maintenance therapy once her SFLC have normalized and she has completed 8 full cycles of triplet therapy. She is rapidly approaching that point.  --RTC in 2 weeks with continued weekly therapy  #Supportive Care --chemotherapy education complete  --patient declines port placement at this time --zofran 53m q8H PRN and compazine 11mPO q6H for nausea --acyclovir 40030mO BID for VCZ prophylaxis --zometa therapy performed on 03/29/2021. Repeat q 3 months.  -- no pain medication required at this time.   No orders of the defined types were placed in this encounter.  All questions were answered. The patient knows to call the clinic with any problems, questions or concerns.  A total of more than 30 minutes were spent on this encounter and over half of that time was spent on counseling and coordination of care as outlined above.   JohLedell PeoplesD Department of Hematology/Oncology [facility] Centerone: [phone]: [phone]: [email or web address]  05/31/2021 3:00 PM   Literature Support:  SuzBennett Scrapertezomib, lenalidomide, and  dexamethasone in transplant-eligible newly diagnosed multiple myeloma patients: a multicenter retrospective comparative analysis. Int J HHinton Dyer020 Jan;111(1):103-111. doi: 10.1007/s12185-019-02764-1.  -- Overall response, very good partial response, and complete response rates after VRD were 96.4%, 45.5%, and 20.0%, respectively (median follow-up period, 17.7 months). The 1-year progression-free survival (PFS) and overall survival rates were 95.8% and 98.2%, respectively. The response rate and PFS were similar between the groups, regardless of cytogenetic risk and age.

## 2021-06-03 LAB — UPEP/UIFE/LIGHT CHAINS/TP, 24-HR UR
% BETA, Urine: 0 %
ALPHA 1 URINE: 0 %
Albumin, U: 0 %
Alpha 2, Urine: 0 %
Free Kappa Lt Chains,Ur: 4.45 mg/L (ref 1.17–86.46)
Free Kappa/Lambda Ratio: 5.93 (ref 1.83–14.26)
Free Lambda Lt Chains,Ur: 0.75 mg/L (ref 0.27–15.21)
GAMMA GLOBULIN URINE: 0 %
Total Protein, Urine-Ur/day: <108 mg/24 hr (ref 30–150)
Total Protein, Urine: <4 mg/dL
Total Volume: 2700

## 2021-06-05 ENCOUNTER — Ambulatory Visit: Payer: Medicare Other | Attending: Hematology and Oncology

## 2021-06-05 ENCOUNTER — Other Ambulatory Visit: Payer: Self-pay

## 2021-06-05 DIAGNOSIS — Z9181 History of falling: Secondary | ICD-10-CM | POA: Diagnosis not present

## 2021-06-05 DIAGNOSIS — R262 Difficulty in walking, not elsewhere classified: Secondary | ICD-10-CM | POA: Insufficient documentation

## 2021-06-05 NOTE — Therapy (Signed)
[facility] [address], [place], [place], [address] Phone: [phone]   Fax:  [phone]  Physical Therapy Treatment  Patient Details  Name: Doris Wilkerson MRN: [id number] Date of Birth: 07-May-1946 Referring Provider (PT): Narda Rutherford, MD   Encounter Date: 06/05/2021   PT End of Session - 06/05/21 1016     Visit Number 9    Date for PT Re-Evaluation 07/19/21    Authorization Type Medicare    Authorization Time Period KX at 15 visits    Progress Note Due on Visit 18    PT Start Time 0932    PT Stop Time 1016    PT Time Calculation (min) 44 min    Activity Tolerance Patient tolerated treatment well;No increased pain    Behavior During Therapy WFL for tasks assessed/performed             Past Medical History:  Diagnosis Date   Asthma    Cataract    GERD (gastroesophageal reflux disease)    Hyperlipidemia    Hypertension    Thyroid disease     Past Surgical History:  Procedure Laterality Date   Cataract Surgery     CORONARY ANGIOPLASTY WITH STENT PLACEMENT     EYE SURGERY     ORTHOPEDIC SURGERY  2012   TONSILLECTOMY  1951    There were no vitals filed for this visit.   Subjective Assessment - 06/05/21 0937     Subjective I have had a lapse in treatment due to not feeling good with chemo.  I'm doing as much as I can at home.    Patient Stated Goals more mobile; decreased falls; get through remainder of chemotherapy (8 more weeks)    Currently in Pain? No/denies                               [facility] Adult PT Treatment/Exercise - 06/05/21 0001       Knee/Hip Exercises: Aerobic   Nustep Level 2x 10 minutes- PT present to discuss progress      Knee/Hip Exercises: Standing   Heel Raises 2 sets;10 reps    Lateral Step Up Right;Left;1 set;10 reps;Hand Hold: 1;Step Height: 2"    Lateral Step Up Limitations foam pad    Forward Step Up Right;Left;1 set;10 reps;Hand Hold:  1;Step Height: 2"    Forward Step Up Limitations foam pad      Knee/Hip Exercises: Seated   Long Arc Quad Both;1 set;10 reps                 Balance Exercises - 06/05/21 0001       Balance Exercises: Standing   Other Standing Exercises Comments box step using 4 colored floor discs.  Close CGA for safety.  Alternating step-taps on low cones on floor to challenge dynamic balance                 PT Short Term Goals - 05/22/21 1416       PT SHORT TERM GOAL #1   Title Patient will be independent with HEP for continued progression at home.    Time 4    Period Weeks    Status Achieved    Target Date 04/23/21      PT SHORT TERM GOAL #2   Title Patient will complete 6 minute walk test without use of AD with minimal gait impairments and no reports of increased  bone pain.    Time 4    Period Weeks    Status Achieved   1190 feet; RPE 4/10; no increased pain   Target Date 04/23/21               PT Long Term Goals - 06/05/21 0940       PT LONG TERM GOAL #2   Title Patient will be independent with advanced HEP for long term management of symptoms post D/C.    Baseline doing exercises as able- side effects from chemo    Status On-going                   Plan - 06/05/21 0955     Clinical Impression Statement Pt with lapse in treatment due to side effect from chemo recently.  Patient subjectively reports significantly improved functional mobility and activity tolerance.  Pt remains a falls risk after most recent DGI testing.   Pt reports 2 days of significant instability after chemo treatments due to anemia.  Pt with limited endurance and strength overall due to underlying medical conditions.  Pt required pacing and close monitoring for safety and energy conservation.  Patient will continue to benefit from continued skilled intervention to address impairments for improved overall function and decreased fall risk.    PT Frequency 1x / week    PT Duration 8  weeks    PT Treatment/Interventions ADLs/Self Care Home Management;Aquatic Therapy;Gait training;Functional mobility training;Therapeutic activities;Therapeutic exercise;Balance training;Neuromuscular re-education;Patient/family education    PT Next Visit Plan continue static and dynamic balance as tolerated, global and functional strength    PT Home Exercise Plan Access Code [email or web address]    Recommended Other Services recert is signed    Consulted and Agree with Plan of Care Patient             Patient will benefit from skilled therapeutic intervention in order to improve the following deficits and impairments:     Visit Diagnosis: Difficulty in walking, not elsewhere classified  History of falling     Problem List Patient Active Problem List   Diagnosis Date Noted   Physical debility 03/21/2021   Dizziness 03/21/2021   Vertigo 03/21/2021   Anemia due to antineoplastic chemotherapy 03/21/2021   Skin changes related to chemotherapy 03/21/2021   Other constipation 03/21/2021   Multiple myeloma not having achieved remission (Early) 01/27/2021   Hordeolum externum of left lower eyelid 06/28/2018   CAD (coronary artery disease) 12/09/2011   Dyslipidemia 12/09/2011   Osteopenia 12/09/2011   Hypothyroid 12/09/2011   Asthma 12/09/2011    Sigurd Sos, PT 06/05/21 10:21 AM   [facility] [address] [place], [place], [address] Phone: [phone]   Fax:  [phone]  Name: Ravina Milner MRN: [id number] Date of Birth: 06-03-1946

## 2021-06-06 ENCOUNTER — Other Ambulatory Visit: Payer: Self-pay | Admitting: Hematology and Oncology

## 2021-06-07 ENCOUNTER — Inpatient Hospital Stay: Payer: Medicare Other

## 2021-06-07 ENCOUNTER — Other Ambulatory Visit: Payer: Self-pay

## 2021-06-07 VITALS — BP 145/77 | HR 77 | Temp 98.3°F

## 2021-06-07 DIAGNOSIS — C9000 Multiple myeloma not having achieved remission: Secondary | ICD-10-CM | POA: Diagnosis not present

## 2021-06-07 DIAGNOSIS — Z79899 Other long term (current) drug therapy: Secondary | ICD-10-CM | POA: Diagnosis not present

## 2021-06-07 DIAGNOSIS — Z5112 Encounter for antineoplastic immunotherapy: Secondary | ICD-10-CM | POA: Diagnosis not present

## 2021-06-07 DIAGNOSIS — I10 Essential (primary) hypertension: Secondary | ICD-10-CM | POA: Diagnosis not present

## 2021-06-07 LAB — CMP (CANCER CENTER ONLY)
ALT: 27 U/L (ref 0–44)
AST: 22 U/L (ref 15–41)
Albumin: 3.9 g/dL (ref 3.5–5.0)
Alkaline Phosphatase: 57 U/L (ref 38–126)
Anion gap: 6 (ref 5–15)
BUN: 12 mg/dL (ref 8–23)
CO2: 24 mmol/L (ref 22–32)
Calcium: 9.5 mg/dL (ref 8.9–10.3)
Chloride: 109 mmol/L (ref 98–111)
Creatinine: 0.77 mg/dL (ref 0.44–1.00)
GFR, Estimated: >60 mL/min (ref >60)
Glucose, Bld: 83 mg/dL (ref 70–99)
Potassium: 4.1 mmol/L (ref 3.5–5.1)
Sodium: 139 mmol/L (ref 135–145)
Total Bilirubin: 1 mg/dL (ref 0.3–1.2)
Total Protein: 6.4 g/dL — ABNORMAL LOW (ref 6.5–8.1)

## 2021-06-07 LAB — CBC WITH DIFFERENTIAL (CANCER CENTER ONLY)
Abs Immature Granulocytes: 0.01 10*3/uL (ref 0.00–0.07)
Basophils Absolute: 0.1 10*3/uL (ref 0.0–0.1)
Basophils Relative: 1 %
Eosinophils Absolute: 0.2 10*3/uL (ref 0.0–0.5)
Eosinophils Relative: 4 %
HCT: 34.8 % — ABNORMAL LOW (ref 36.0–46.0)
Hemoglobin: 11.6 g/dL — ABNORMAL LOW (ref 12.0–15.0)
Immature Granulocytes: 0 %
Lymphocytes Relative: 8 %
Lymphs Abs: 0.4 10*3/uL — ABNORMAL LOW (ref 0.7–4.0)
MCH: 30.4 pg (ref 26.0–34.0)
MCHC: 33.3 g/dL (ref 30.0–36.0)
MCV: 91.3 fL (ref 80.0–100.0)
Monocytes Absolute: 0.4 10*3/uL (ref 0.1–1.0)
Monocytes Relative: 10 %
Neutro Abs: 3.4 10*3/uL (ref 1.7–7.7)
Neutrophils Relative %: 77 %
Platelet Count: 172 10*3/uL (ref 150–400)
RBC: 3.81 MIL/uL — ABNORMAL LOW (ref 3.87–5.11)
RDW: 15.3 % (ref 11.5–15.5)
WBC Count: 4.5 10*3/uL (ref 4.0–10.5)
nRBC: 0 % (ref 0.0–0.2)

## 2021-06-07 LAB — LACTATE DEHYDROGENASE: LDH: 205 U/L — ABNORMAL HIGH (ref 98–192)

## 2021-06-07 MED ORDER — BORTEZOMIB CHEMO SQ INJECTION 3.5 MG (2.5MG/ML)
1.3000 mg/m2 | Freq: Once | INTRAMUSCULAR | Status: AC
  Administered 2021-06-07: 2.25 mg via SUBCUTANEOUS
  Filled 2021-06-07: qty 0.9

## 2021-06-07 NOTE — Patient Instructions (Signed)
Bortezomib injection What is this medication? BORTEZOMIB (bor TEZ oh mib) targets proteins in cancer cells and stops thecancer cells from growing. It treats multiple myeloma and mantle cell lymphoma. This medicine may be used for other purposes; ask your health care provider orpharmacist if you have questions. COMMON BRAND NAME(S): Velcade What should I tell my care team before I take this medication? They need to know if you have any of these conditions: dehydration diabetes (high blood sugar) heart disease liver disease tingling of the fingers or toes or other nerve disorder an unusual or allergic reaction to bortezomib, mannitol, boron, other medicines, foods, dyes, or preservatives pregnant or trying to get pregnant breast-feeding How should I use this medication? This medicine is injected into a vein or under the skin. It is given by ahealth care provider in a hospital or clinic setting. Talk to your health care provider about the use of this medicine in children.Special care may be needed. Overdosage: If you think you have taken too much of this medicine contact apoison control center or emergency room at once. NOTE: This medicine is only for you. Do not share this medicine with others. What if I miss a dose? Keep appointments for follow-up doses. It is important not to miss your dose.Call your health care provider if you are unable to keep an appointment. What may interact with this medication? This medicine may interact with the following medications: ketoconazole rifampin This list may not describe all possible interactions. Give your health care provider a list of all the medicines, herbs, non-prescription drugs, or dietary supplements you use. Also tell them if you smoke, drink alcohol, or use illegaldrugs. Some items may interact with your medicine. What should I watch for while using this medication? Your condition will be monitored carefully while you are receiving  thismedicine. You may need blood work done while you are taking this medicine. You may get drowsy or dizzy. Do not drive, use machinery, or do anything that needs mental alertness until you know how this medicine affects you. Do not stand up or sit up quickly, especially if you are an older patient. Thisreduces the risk of dizzy or fainting spells This medicine may increase your risk of getting an infection. Call your health care provider for advice if you get a fever, chills, sore throat, or other symptoms of a cold or flu. Do not treat yourself. Try to avoid being aroundpeople who are sick. Check with your health care provider if you have severe diarrhea, nausea, and vomiting, or if you sweat a lot. The loss of too much body fluid may make itdangerous for you to take this medicine. Do not become pregnant while taking this medicine or for 7 months after stopping it. Women should inform their health care provider if they wish to become pregnant or think they might be pregnant. Men should not father a child while taking this medicine and for 4 months after stopping it. There is a potential for serious harm to an unborn child. Talk to your health care provider for more information. Do not breast-feed an infant while taking thismedicine or for 2 months after stopping it. This medicine may make it more difficult to get pregnant or father a child.Talk to your health care provider if you are concerned about your fertility. What side effects may I notice from receiving this medication? Side effects that you should report to your doctor or health care professionalas soon as possible: allergic reactions (skin rash; itching or hives; swelling   of the face, lips, or tongue) bleeding (bloody or black, tarry stools; red or dark brown urine; spitting up blood or brown material that looks like coffee grounds; red spots on the skin; unusual bruising or bleeding from the eye, gums, or nose) blurred vision or changes in  vision confusion constipation headache heart failure (trouble breathing; fast, irregular heartbeat; sudden weight gain; swelling of the ankles, feet, hands) infection (fever, chills, cough, sore throat, pain or trouble passing urine) lack or loss of appetite liver injury (dark yellow or brown urine; general ill feeling or flu-like symptoms; loss of appetite, right upper belly pain; yellowing of the eyes or skin) low blood pressure (dizziness; feeling faint or lightheaded, falls; unusually weak or tired) muscle cramps pain, redness, or irritation at site where injected pain, tingling, numbness in the hands or feet seizures trouble breathing unusual bruising or bleeding Side effects that usually do not require medical attention (report to yourdoctor or health care professional if they continue or are bothersome): diarrhea nausea stomach pain trouble sleeping vomiting This list may not describe all possible side effects. Call your doctor for medical advice about side effects. You may report side effects to FDA at1-800-FDA-1088. Where should I keep my medication? This medicine is given in a hospital or clinic. It will not be stored at home. NOTE: This sheet is a summary. It may not cover all possible information. If you have questions about this medicine, talk to your doctor, pharmacist, orhealth care provider.  2022 Elsevier/Gold Standard (2020-11-01 13:22:53)  

## 2021-06-07 NOTE — Progress Notes (Signed)
Takes decadron at home prior to her appointment

## 2021-06-08 ENCOUNTER — Encounter: Payer: Self-pay | Admitting: Hematology and Oncology

## 2021-06-12 ENCOUNTER — Ambulatory Visit: Payer: Medicare Other | Admitting: Physical Therapy

## 2021-06-12 ENCOUNTER — Other Ambulatory Visit: Payer: Self-pay

## 2021-06-12 ENCOUNTER — Other Ambulatory Visit: Payer: Self-pay | Admitting: *Deleted

## 2021-06-12 DIAGNOSIS — R262 Difficulty in walking, not elsewhere classified: Secondary | ICD-10-CM | POA: Diagnosis not present

## 2021-06-12 DIAGNOSIS — Z9181 History of falling: Secondary | ICD-10-CM | POA: Diagnosis not present

## 2021-06-12 DIAGNOSIS — C9000 Multiple myeloma not having achieved remission: Secondary | ICD-10-CM

## 2021-06-12 MED ORDER — LENALIDOMIDE 25 MG PO CAPS
25.0000 mg | ORAL_CAPSULE | Freq: Every day | ORAL | 0 refills | Status: DC
Start: 2021-06-12 — End: 2021-07-04

## 2021-06-12 NOTE — Therapy (Signed)
[facility] [address] [place], [place], [address] Phone: [phone]   Fax:  [phone]  Physical Therapy Treatment  Patient Details  Name: Doris Wilkerson MRN: [id number] Date of Birth: 09/06/1946 Referring Provider (PT): Narda Rutherford, MD   Encounter Date: 06/12/2021   PT End of Session - 06/12/21 1248     Visit Number 10    Date for PT Re-Evaluation 07/19/21    Authorization Type Medicare    Authorization Time Period KX at 61 visits    Progress Note Due on Visit 18    PT Start Time 0930    PT Stop Time 1010    PT Time Calculation (min) 40 min             Past Medical History:  Diagnosis Date   Asthma    Cataract    GERD (gastroesophageal reflux disease)    Hyperlipidemia    Hypertension    Thyroid disease     Past Surgical History:  Procedure Laterality Date   Cataract Surgery     CORONARY ANGIOPLASTY WITH STENT [place]  2012   TONSILLECTOMY  1951    There were no vitals filed for this visit.   Subjective Assessment - 06/12/21 1243     Subjective Patient reports that she has five more chemo appointments. Feels good today. Has no bone pain.    Pertinent History multiple myeloma (current)    Limitations Sitting;Walking;Standing    How long can you sit comfortably? 20 minutes with extra cushioning    How long can you stand comfortably? unlimited    How long can you walk comfortably? 15-20 minutes    Patient Stated Goals more mobile; decreased falls; get through remainder of chemotherapy (7 more weeks)    Currently in Pain? No/denies                               [facility] Adult PT Treatment/Exercise - 06/12/21 0001       Neuro Re-ed    Neuro Re-ed Details  dot box step x10 toward Lt/Rt; SLS with dot tap lateral and forward 45 degrees x15 Lt/Rt; alternating low cone taps      Lumbar Exercises: Seated   Other Seated Lumbar  Exercises seated BOSU: chops with blue ball, x10 Lt/Rt      Knee/Hip Exercises: Aerobic   Nustep Level 2x 10 minutes- PT present to discuss progress and assess response to activity      Knee/Hip Exercises: Standing   Heel Raises 1 set;20 reps    Lateral Step Up Right;Left;1 set;10 reps;Hand Hold: 1    Lateral Step Up Limitations step up to black foam pad    Forward Step Up Right;Left;1 set;10 reps;Hand Hold: 1    Forward Step Up Limitations step up to black foam pad      Knee/Hip Exercises: Seated   Long Arc Quad Right;Left;1 set;15 reps    [facility] ball squeeze x 20 repetitions                      PT Short Term Goals - 05/22/21 1416       PT SHORT TERM GOAL #1   Title Patient will be independent with HEP for continued progression at home.    Time 4    Period Weeks    Status  Achieved    Target Date 04/23/21      PT SHORT TERM GOAL #2   Title Patient will complete 6 minute walk test without use of AD with minimal gait impairments and no reports of increased bone pain.    Time 4    Period Weeks    Status Achieved   1190 feet; RPE 4/10; no increased pain   Target Date 04/23/21               PT Long Term Goals - 06/12/21 1248       PT LONG TERM GOAL #1   Title Patient will improve FOTO score to 60 to indicate improved overall function.    Time 8    Period Weeks    Status On-going      PT LONG TERM GOAL #4   Title Patient will score >/= 22/24 on DGI to indicate decreased fall risk.    Baseline 20    Time 8    Period Weeks    Status On-going                   Plan - 06/12/21 1246     Clinical Impression Statement Patient requiring CGA x2 when performing alternate cone tap activities. Therapist noting increased instability with weight shift to Lt LE. She would benefit from continued skilled intervention to address impairments for decreased fall risk and improved functional mobility.    Personal Factors and Comorbidities Comorbidity 1     Comorbidities Cancer (current)    Examination-Activity Limitations Sit;Locomotion Level;Transfers    Examination-Participation Restrictions Community Activity;Cleaning    Rehab Potential Good    PT Frequency 1x / week    PT Duration 8 weeks    PT Treatment/Interventions ADLs/Self Care Home Management;Aquatic Therapy;Gait training;Functional mobility training;Therapeutic activities;Therapeutic exercise;Balance training;Neuromuscular re-education;Patient/family education    PT Next Visit Plan continue static and dynamic balance as tolerated, global and functional strength    PT Home Exercise Plan Access Code [email or web address]    Consulted and Agree with Plan of Care Patient             Patient will benefit from skilled therapeutic intervention in order to improve the following deficits and impairments:  Decreased activity tolerance, Abnormal gait, Difficulty walking, Pain  Visit Diagnosis: Difficulty in walking, not elsewhere classified  History of falling     Problem List Patient Active Problem List   Diagnosis Date Noted   Physical debility 03/21/2021   Dizziness 03/21/2021   Vertigo 03/21/2021   Anemia due to antineoplastic chemotherapy 03/21/2021   Skin changes related to chemotherapy 03/21/2021   Other constipation 03/21/2021   Multiple myeloma not having achieved remission ([place]) 01/27/2021   Hordeolum externum of left lower eyelid 06/28/2018   CAD (coronary artery disease) 12/09/2011   Dyslipidemia 12/09/2011   Osteopenia 12/09/2011   Hypothyroid 12/09/2011   Asthma 12/09/2011   Everardo All PT, DPT  06/12/21 12:50 PM  [facility] [address], [place], [place], [address] Phone: [phone]   Fax:  [phone]  Name: Doris Wilkerson MRN: [id number] Date of Birth: Feb 01, 1946

## 2021-06-14 ENCOUNTER — Inpatient Hospital Stay: Payer: Medicare Other

## 2021-06-14 ENCOUNTER — Other Ambulatory Visit: Payer: Self-pay

## 2021-06-14 ENCOUNTER — Inpatient Hospital Stay (HOSPITAL_BASED_OUTPATIENT_CLINIC_OR_DEPARTMENT_OTHER): Payer: Medicare Other | Admitting: Hematology and Oncology

## 2021-06-14 VITALS — BP 123/61 | HR 72 | Temp 98.4°F | Resp 18 | Ht 66.0 in | Wt 150.2 lb

## 2021-06-14 DIAGNOSIS — Z79899 Other long term (current) drug therapy: Secondary | ICD-10-CM | POA: Diagnosis not present

## 2021-06-14 DIAGNOSIS — C9000 Multiple myeloma not having achieved remission: Secondary | ICD-10-CM

## 2021-06-14 DIAGNOSIS — I10 Essential (primary) hypertension: Secondary | ICD-10-CM

## 2021-06-14 DIAGNOSIS — D6481 Anemia due to antineoplastic chemotherapy: Secondary | ICD-10-CM

## 2021-06-14 DIAGNOSIS — M899 Disorder of bone, unspecified: Secondary | ICD-10-CM

## 2021-06-14 DIAGNOSIS — T451X5A Adverse effect of antineoplastic and immunosuppressive drugs, initial encounter: Secondary | ICD-10-CM

## 2021-06-14 DIAGNOSIS — Z5112 Encounter for antineoplastic immunotherapy: Secondary | ICD-10-CM | POA: Diagnosis not present

## 2021-06-14 LAB — CMP (CANCER CENTER ONLY)
ALT: 26 U/L (ref 0–44)
AST: 22 U/L (ref 15–41)
Albumin: 4.5 g/dL (ref 3.5–5.0)
Alkaline Phosphatase: 45 U/L (ref 38–126)
Anion gap: 9 (ref 5–15)
BUN: 12 mg/dL (ref 8–23)
CO2: 27 mmol/L (ref 22–32)
Calcium: 10 mg/dL (ref 8.9–10.3)
Chloride: 104 mmol/L (ref 98–111)
Creatinine: 0.72 mg/dL (ref 0.44–1.00)
GFR, Estimated: >60 mL/min (ref >60)
Glucose, Bld: 88 mg/dL (ref 70–99)
Potassium: 3.9 mmol/L (ref 3.5–5.1)
Sodium: 140 mmol/L (ref 135–145)
Total Bilirubin: 0.9 mg/dL (ref 0.3–1.2)
Total Protein: 6.4 g/dL — ABNORMAL LOW (ref 6.5–8.1)

## 2021-06-14 LAB — CBC WITH DIFFERENTIAL (CANCER CENTER ONLY)
Abs Immature Granulocytes: 0.02 10*3/uL (ref 0.00–0.07)
Basophils Absolute: 0 10*3/uL (ref 0.0–0.1)
Basophils Relative: 1 %
Eosinophils Absolute: 0.4 10*3/uL (ref 0.0–0.5)
Eosinophils Relative: 9 %
HCT: 35.2 % — ABNORMAL LOW (ref 36.0–46.0)
Hemoglobin: 11.5 g/dL — ABNORMAL LOW (ref 12.0–15.0)
Immature Granulocytes: 0 %
Lymphocytes Relative: 10 %
Lymphs Abs: 0.5 10*3/uL — ABNORMAL LOW (ref 0.7–4.0)
MCH: 30.3 pg (ref 26.0–34.0)
MCHC: 32.7 g/dL (ref 30.0–36.0)
MCV: 92.6 fL (ref 80.0–100.0)
Monocytes Absolute: 0.4 10*3/uL (ref 0.1–1.0)
Monocytes Relative: 9 %
Neutro Abs: 3.4 10*3/uL (ref 1.7–7.7)
Neutrophils Relative %: 71 %
Platelet Count: 184 10*3/uL (ref 150–400)
RBC: 3.8 MIL/uL — ABNORMAL LOW (ref 3.87–5.11)
RDW: 15.9 % — ABNORMAL HIGH (ref 11.5–15.5)
WBC Count: 4.8 10*3/uL (ref 4.0–10.5)
nRBC: 0 % (ref 0.0–0.2)

## 2021-06-14 LAB — LACTATE DEHYDROGENASE: LDH: 150 U/L (ref 98–192)

## 2021-06-14 MED ORDER — BORTEZOMIB CHEMO SQ INJECTION 3.5 MG (2.5MG/ML)
1.3000 mg/m2 | Freq: Once | INTRAMUSCULAR | Status: AC
  Administered 2021-06-14: 2.25 mg via SUBCUTANEOUS
  Filled 2021-06-14: qty 0.9

## 2021-06-14 NOTE — Patient Instructions (Signed)
[facility]   ?Discharge Instructions: ?Thank you for choosing [facility] to provide your oncology and hematology care.  ? ?If you have a lab appointment with the Cancer Center, please go directly to the Cancer Center and check in at the registration area. ?  ?Wear comfortable clothing and clothing appropriate for easy access to any Portacath or PICC line.  ? ?We strive to give you quality time with your provider. You may need to reschedule your appointment if you arrive late (15 or more minutes).  Arriving late affects you and other patients whose appointments are after yours.  Also, if you miss three or more appointments without notifying the office, you may be dismissed from the clinic at the provider?s discretion.    ?  ?For prescription refill requests, have your pharmacy contact our office and allow 72 hours for refills to be completed.   ? ?Today you received the following chemotherapy and/or immunotherapy agents: bortezomib    ?  ?To help prevent nausea and vomiting after your treatment, we encourage you to take your nausea medication as directed. ? ?BELOW ARE SYMPTOMS THAT SHOULD BE REPORTED IMMEDIATELY: ?*FEVER GREATER THAN 100.4 F (38 ?C) OR HIGHER ?*CHILLS OR SWEATING ?*NAUSEA AND VOMITING THAT IS NOT CONTROLLED WITH YOUR NAUSEA MEDICATION ?*UNUSUAL SHORTNESS OF BREATH ?*UNUSUAL BRUISING OR BLEEDING ?*URINARY PROBLEMS (pain or burning when urinating, or frequent urination) ?*BOWEL PROBLEMS (unusual diarrhea, constipation, pain near the anus) ?TENDERNESS IN MOUTH AND THROAT WITH OR WITHOUT PRESENCE OF ULCERS (sore throat, sores in mouth, or a toothache) ?UNUSUAL RASH, SWELLING OR PAIN  ?UNUSUAL VAGINAL DISCHARGE OR ITCHING  ? ?Items with * indicate a potential emergency and should be followed up as soon as possible or go to the Emergency Department if any problems should occur. ? ?Please show the CHEMOTHERAPY ALERT CARD or IMMUNOTHERAPY ALERT CARD at check-in  to the Emergency Department and triage nurse. ? ?Should you have questions after your visit or need to cancel or reschedule your appointment, please contact [facility]  Dept: [phone]  and follow the prompts.  Office hours are 8:00 a.m. to 4:30 p.m. Monday - Friday. Please note that voicemails left after 4:00 p.m. may not be returned until the following business day.  We are closed weekends and major holidays. You have access to a nurse at all times for urgent questions. Please call the main number to the clinic Dept: [phone] and follow the prompts. ? ? ?For any non-urgent questions, you may also contact your provider using MyChart. We now offer e-Visits for anyone 18 and older to request care online for non-urgent symptoms. For details visit [email or web address]. ?  ?Also download the MyChart app! Go to the app store, search "MyChart", open the app, select [facility], and log in with your MyChart username and password. ? ?Due to Covid, a mask is required upon entering the hospital/clinic. If you do not have a mask, one will be given to you upon arrival. For doctor visits, patients may have 1 support person aged 18 or older with them. For treatment visits, patients cannot have anyone with them due to current Covid guidelines and our immunocompromised population.  ? ?

## 2021-06-14 NOTE — Progress Notes (Signed)
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 Concerning for multiple myeloma vs metastatic disease 12/31/2020: establish care with Dr. Lorenso Courier. UPEP showed marked Bence Jones protein, findings concerning for multiple myleoma 01/21/2021: bone marrow biopsy confirms multiple myeloma with atypical plasma cells representing 28% of all cells in the aspirate  02/01/2021: Cycle 1 Day 1 of VRd chemotherapy 02/22/2021: Cycle 2 Day 1 of VRd chemotherapy 03/15/2021: Cycle 3 Day 1 of VRd chemotherapy 04/05/2021: Cycle 4 Day 1 of VRd chemotherapy 04/26/2021: Cycle 5 Day 1 of VRd chemotherapy 05/17/2021: Cycle 6 Day 1 of VRd chemotherapy 06/07/2021:  Cycle 7 Day 1 of VRd chemotherapy  Interval History:  Doris Wilkerson 75 y.o. female with medical history significant for lambda light chain multiple myeloma who presents for a follow up visit. The patient's last visit was on 05/31/2021. In the interim since the last visit she continued on VRd chemotherapy.   On exam today [name] reports she continues to tolerate treatment well.  She has noticed the pattern of being fatigued on Mondays and Tuesdays and feeling good by Wednesdays.  On Wednesday she has her physical therapy and is been working on core and balance.  She notes that this week she did have some loose stools on Monday and Tuesday but she would not describe them as diarrhea.  She notes that she did not take any medications in order to slow this down.  Her weight has dropped slightly from 153 down to 150 pounds.  She notes that she stopped her milkshakes with the  diarrhea and she has not had as much protein this week.  She notes that she is ambulating well and otherwise has no questions concerns or complaints.  She denies any fevers, chills, sweats, nausea, vomiting or diarrhea. A full 10 point ROS is listed below.   MEDICAL HISTORY:  Past Medical History:  Diagnosis Date   Asthma    Cataract    GERD (gastroesophageal reflux disease)    Hyperlipidemia    Hypertension    Thyroid disease     SURGICAL HISTORY: Past Surgical History:  Procedure Laterality Date   Cataract Surgery     CORONARY ANGIOPLASTY WITH STENT [place]     EYE SURGERY     ORTHOPEDIC SURGERY  2012   TONSILLECTOMY  1951    SOCIAL HISTORY: Social History   Socioeconomic History   Marital status: Married    Spouse name: Not on file   Number of children: 0   Years of education: Not on file   Highest education level: Not on file  Occupational History   Occupation: unemployed  Tobacco Use   Smoking status: Never   Smokeless tobacco: Never  Vaping Use   Vaping Use: Never used  Substance and Sexual Activity   Alcohol use: Yes    Alcohol/week: 1.0 standard drink    Types: 1 Glasses of wine per week    Comment: occassionally   Drug use: No   Sexual activity: Not Currently  Other Topics Concern   Not on file  Social History Narrative   Married. Education: college. Pt does exercise-   Social Determinants of Health  Financial Resource Strain: Not on file  Food Insecurity: Not on file  Transportation Needs: Not on file  Physical Activity: Not on file  Stress: Not on file  Social Connections: Not on file  Intimate Partner Violence: Not on file    FAMILY HISTORY: Family History  Problem Relation Age of Onset   Cancer Father    Heart disease Brother    Dementia Brother    Leukemia Brother    Breast cancer Neg Hx    Colon cancer Neg Hx    Esophageal cancer Neg Hx    Pancreatic cancer Neg Hx    Rectal cancer Neg Hx    Stomach cancer Neg Hx      ALLERGIES:  is allergic to poison ivy extract, plavix [clopidogrel bisulfate], and other.  MEDICATIONS:  Current Outpatient Medications  Medication Sig Dispense Refill   simethicone (MYLICON) 607 MG chewable tablet Chew 125 mg by mouth every 6 (six) hours as needed for flatulence.     acetaminophen (TYLENOL 8 HOUR ARTHRITIS PAIN) 650 MG CR tablet Take 1 tablet (650 mg total) by mouth every 8 (eight) hours as needed for pain.     acyclovir (ZOVIRAX) 400 MG tablet Take 1 tablet (400 mg total) by mouth 2 (two) times daily. 60 tablet 5   aspirin 81 MG tablet Take 81 mg by mouth daily.      atorvastatin (LIPITOR) 80 MG tablet Take 1 tablet (80 mg total) by mouth daily at 6 PM. 90 tablet 3   benazepril (LOTENSIN) 10 MG tablet TAKE 1 TABLET(10 MG) BY MOUTH DAILY 90 tablet 1   calcium carbonate (OS-CAL) 1250 (500 Ca) MG chewable tablet Chew 1 tablet by mouth daily.     cholecalciferol (VITAMIN D3) 25 MCG (1000 UNIT) tablet Take 2,000 Units by mouth daily.     dexamethasone (DECADRON) 4 MG tablet TAKE 10 TABLETS BY MOUTH ONCE A WEEK 40 tablet 3   DM-APAP-CPM (CORICIDIN HBP PO) Take by mouth.      famotidine-calcium carbonate-magnesium hydroxide (PEPCID COMPLETE) 10-800-165 MG chewable tablet Chew 1 tablet by mouth daily.     lenalidomide (REVLIMID) 25 MG capsule Take 1 capsule (25 mg total) by mouth daily. Take for 14 days; then none for 7 days. Repeat every 21 days [employer] Auth # [id number]  Date Obtained 06/12/21 14 capsule 0   levothyroxine (SYNTHROID) 25 MCG tablet Take 1 tablet (25 mcg total) by mouth daily. 90 tablet 0   ondansetron (ZOFRAN) 8 MG tablet Take 1 tablet (8 mg total) by mouth every 8 (eight) hours as needed for nausea or vomiting. 30 tablet 0   prochlorperazine (COMPAZINE) 10 MG tablet Take 1 tablet (10 mg total) by mouth every 6 (six) hours as needed for nausea or vomiting. 30 tablet 0   No current facility-administered medications for this visit.   Facility-Administered  Medications Ordered in Other Visits  Medication Dose Route Frequency Provider Last Rate Last Admin   bortezomib SQ (VELCADE) chemo injection (2.75m/mL concentration) 2.25 mg  1.3 mg/m2 (Treatment Plan Recorded) Subcutaneous Once DOrson Slick MD        REVIEW OF SYSTEMS:   Constitutional: ( - ) fevers, ( - )  chills , ( - ) night sweats Eyes: ( - ) blurriness of vision, ( - ) double vision, ( - ) watery eyes Ears, nose, mouth, throat, and face: ( - ) mucositis, ( - ) sore throat Respiratory: ( - ) cough, ( - ) dyspnea, ( - )  wheezes Cardiovascular: ( - ) palpitation, ( - ) chest discomfort, ( - ) lower extremity swelling Gastrointestinal:  ( - ) nausea, ( - ) heartburn, ( - ) change in bowel habits Skin: ( - ) abnormal skin rashes Lymphatics: ( - ) new lymphadenopathy, ( - ) easy bruising Neurological: ( - ) numbness, ( - ) tingling, ( - ) new weaknesses Behavioral/Psych: ( - ) mood change, ( - ) new changes  All other systems were reviewed with the patient and are negative.  PHYSICAL EXAMINATION: ECOG PERFORMANCE STATUS: 1 - Symptomatic but completely ambulatory  Vitals:   06/14/21 0957  BP: 123/61  Pulse: 72  Resp: 18  Temp: 98.4 F (36.9 C)  SpO2: 100%    Filed Weights   06/14/21 0957  Weight: 150 lb 3.2 oz (68.1 kg)     GENERAL: well appearing elderly Caucasian female in NAD  SKIN: skin color, texture, turgor are normal, no rashes or significant lesions EYES: conjunctiva are pink and non-injected, sclera clear LUNGS: clear to auscultation and percussion with normal breathing effort HEART: regular rate & rhythm and no murmurs and trace lower extremity edema Musculoskeletal: no cyanosis of digits and no clubbing  PSYCH: alert & oriented x 3, fluent speech NEURO: no focal motor/sensory deficits  LABORATORY DATA:  I have reviewed the data as listed CBC Latest Ref Rng & Units 06/14/2021 06/07/2021 05/31/2021  WBC 4.0 - 10.5 K/uL 4.8 4.5 7.9  Hemoglobin 12.0 - 15.0  g/dL 11.5(L) 11.6(L) 11.4(L)  Hematocrit 36.0 - 46.0 % 35.2(L) 34.8(L) 34.3(L)  Platelets 150 - 400 K/uL 184 172 229    CMP Latest Ref Rng & Units 06/14/2021 06/07/2021 05/31/2021  Glucose 70 - 99 mg/dL 88 83 97  BUN 8 - 23 mg/dL '12 12 14  ' Creatinine 0.44 - 1.00 mg/dL 0.72 0.77 0.74  Sodium 135 - 145 mmol/L 140 139 141  Potassium 3.5 - 5.1 mmol/L 3.9 4.1 4.2  Chloride 98 - 111 mmol/L 104 109 108  CO2 22 - 32 mmol/L '27 24 24  ' Calcium 8.9 - 10.3 mg/dL 10.0 9.5 9.2  Total Protein 6.5 - 8.1 g/dL 6.4(L) 6.4(L) 6.2(L)  Total Bilirubin 0.3 - 1.2 mg/dL 0.9 1.0 1.0  Alkaline Phos 38 - 126 U/L 45 57 64  AST 15 - 41 U/L '22 22 20  ' ALT 0 - 44 U/L '26 27 27    ' Lab Results  Component Value Date   MPROTEIN Not Observed 05/24/2021   MPROTEIN Not Observed 05/03/2021   MPROTEIN Not Observed 03/29/2021   Lab Results  Component Value Date   KPAFRELGTCHN 16.1 05/24/2021   KPAFRELGTCHN 19.7 (H) 05/03/2021   KPAFRELGTCHN 14.9 04/05/2021   LAMBDASER 33.8 (H) 05/24/2021   LAMBDASER 51.7 (H) 05/03/2021   LAMBDASER 74.6 (H) 04/05/2021   KAPLAMBRATIO 5.93 05/31/2021   KAPLAMBRATIO 0.48 05/24/2021   KAPLAMBRATIO 6.01 05/03/2021    RADIOGRAPHIC STUDIES: No results found.  ASSESSMENT & PLAN Doris Wilkerson 75 y.o. female with medical history significant for lambda light chain multiple myeloma who presents for a follow up visit.   After review the labs, review the records, schedule the patient the findings most consistent with a lambda light chain multiple myeloma.  This is getting confirmed with a bone marrow biopsy showing an abnormal plasma cell population of 28% and M protein/Bence-Jones proteins in the urine.  Given these findings the patient now carries a diagnosis of lambda light chain multiple myeloma.  R-ISS: Stage II (high risk genetics)  We will proceed with VRd chemotherapy.  This consists of bortezomib 1.3 mg per metered squared q week, lenalidomide 25 mg p.o. x14 days of 21-day cycle, and  dexamethasone 40 mg p.o. q. weekly.  This will be continued until he reached a VGPR/ complete 8 cycles of triplet therapy, at which time we can consider transition over to maintenance Revlimid 10 mg p.o. daily 21 of 28 days per cycle.  Ms. Leer is not a candidate for bone marrow transplant based on her advanced age.  # Lambda Light Chain Multiple Myeloma --diagnosis confirmed with bone marrow biopsy and urine protein analysis.   --patient has good functional status and would be an excellent candidate for VRd chemotherapy. I do not believe she would be a bone marrow transplant candidate based on her age.  --assure monthly restaging labs with SPEP, UPEP, and SFLC --patient declines port placement at this time --Cycle 1 Day 1 of therapy started on 02/01/2021.  --today is Cycle 7 Day 8 of VRd --excellent response noted within 1st cycle, normalization of urine free light chains and undetectable M protein.  --can consider transition to maintenance therapy once her SFLC have normalized and she has completed 8 full cycles of triplet therapy. She is rapidly approaching that point.  --RTC in 2 weeks with continued weekly therapy  #Supportive Care --chemotherapy education complete  --patient declines port placement at this time --zofran 51m q8H PRN and compazine 129mPO q6H for nausea --acyclovir 40031mO BID for VCZ prophylaxis --zometa therapy performed on 03/29/2021. Repeat q 3 months.  -- no pain medication required at this time.   No orders of the defined types were placed in this encounter.  All questions were answered. The patient knows to call the clinic with any problems, questions or concerns.  A total of more than 30 minutes were spent on this encounter and over half of that time was spent on counseling and coordination of care as outlined above.   JohLedell PeoplesD Department of Hematology/Oncology Con[facility] WesThe Mir[facility] Hospitalone: [phone]:  [phone]: [email or web address]  06/14/2021 11:06 AM   Literature Support:  SuzBennett Scrapertezomib, lenalidomide, and dexamethasone in transplant-eligible newly diagnosed multiple myeloma patients: a multicenter retrospective comparative analysis. Int J HHinton Dyer020 Jan;111(1):103-111. doi: 10.1007/s12185-019-02764-1.  -- Overall response, very good partial response, and complete response rates after VRD were 96.4%, 45.5%, and 20.0%, respectively (median follow-up period, 17.7 months). The 1-year progression-free survival (PFS) and overall survival rates were 95.8% and 98.2%, respectively. The response rate and PFS were similar between the groups, regardless of cytogenetic risk and age.

## 2021-06-15 ENCOUNTER — Encounter: Payer: Self-pay | Admitting: Hematology and Oncology

## 2021-06-19 ENCOUNTER — Ambulatory Visit: Payer: Medicare Other | Admitting: Physical Therapy

## 2021-06-19 ENCOUNTER — Other Ambulatory Visit: Payer: Self-pay | Admitting: Family Medicine

## 2021-06-19 ENCOUNTER — Other Ambulatory Visit: Payer: Self-pay

## 2021-06-19 DIAGNOSIS — R262 Difficulty in walking, not elsewhere classified: Secondary | ICD-10-CM | POA: Diagnosis not present

## 2021-06-19 DIAGNOSIS — Z9181 History of falling: Secondary | ICD-10-CM | POA: Diagnosis not present

## 2021-06-19 DIAGNOSIS — E782 Mixed hyperlipidemia: Secondary | ICD-10-CM

## 2021-06-19 NOTE — Therapy (Signed)
[facility] [address], [place], [place], [address] Phone: [phone]   Fax:  [phone]  Physical Therapy Treatment  Patient Details  Name: Doris Wilkerson MRN: [id number] Date of Birth: 11/29/1945 Referring Provider (PT): Narda Rutherford, MD   Encounter Date: 06/19/2021   PT End of Session - 06/19/21 1022     Visit Number 11    Date for PT Re-Evaluation 07/19/21    Authorization Type Medicare    Authorization Time Period KX at 15 visits    Progress Note Due on Visit 18    PT Start Time 0927    PT Stop Time 1009    PT Time Calculation (min) 42 min    Activity Tolerance Patient tolerated treatment well;No increased pain    Behavior During Therapy WFL for tasks assessed/performed             Past Medical History:  Diagnosis Date   Asthma    Cataract    GERD (gastroesophageal reflux disease)    Hyperlipidemia    Hypertension    Thyroid disease     Past Surgical History:  Procedure Laterality Date   Cataract Surgery     CORONARY ANGIOPLASTY WITH STENT PLACEMENT     EYE SURGERY     ORTHOPEDIC SURGERY  2012   TONSILLECTOMY  1951    There were no vitals filed for this visit.   Subjective Assessment - 06/19/21 0930     Subjective Has some chemo related diarrhea today but no bone pain. Continues to remain active at home as she is able.    Pertinent History multiple myeloma (current)    Limitations Sitting;Walking;Standing    How long can you sit comfortably? 20 minutes with extra cushioning    How long can you stand comfortably? unlimited    How long can you walk comfortably? 15-20 minutes    Patient Stated Goals more mobile; decreased falls; get through remainder of chemotherapy (4 more weeks)    Currently in Pain? No/denies                               [facility] Adult PT Treatment/Exercise - 06/19/21 0001       Neuro Re-ed    Neuro Re-ed Details  red ball bounce x60s:  staggered stance Lt/Rt, close stance, wide stance, on black foam with normal stance      Lumbar Exercises: Standing   Row Power tower;Both;10 reps    Row Limitations 15#; seated on green ball    Shoulder Extension Power Tower;Both;10 reps    Shoulder Extension Limitations 15#; seated on green ball    Other Standing Lumbar Exercises seated on green ball: sashes, yellow tband, x10 Lt/Rt      Lumbar Exercises: Quadruped   Opposite Arm/Leg Raise Right arm/Left leg;Left arm/Right leg;10 reps;2 seconds    Opposite Arm/Leg Raise Limitations standing at raised table      Knee/Hip Exercises: Aerobic   Nustep Level 2 x 8 minutes- PT present to discuss progress and assess response to activity      Knee/Hip Exercises: Standing   Hip Flexion Right;Left;1 set;10 reps;Knee bent    Hip Flexion Limitations standing on rebounder    SLS with Vectors with slider into extension x 10 Lt/Rt; hand hold x2                      PT Short Term Goals -  05/22/21 1416       PT SHORT TERM GOAL #1   Title Patient will be independent with HEP for continued progression at home.    Time 4    Period Weeks    Status Achieved    Target Date 04/23/21      PT SHORT TERM GOAL #2   Title Patient will complete 6 minute walk test without use of AD with minimal gait impairments and no reports of increased bone pain.    Time 4    Period Weeks    Status Achieved   1190 feet; RPE 4/10; no increased pain   Target Date 04/23/21               PT Long Term Goals - 06/19/21 1014       PT LONG TERM GOAL #2   Title Patient will be independent with advanced HEP for long term management of symptoms post D/C.    Time 8    Period Weeks    Status On-going                   Plan - 06/19/21 1012     Clinical Impression Statement Patient requiring extended seated recovery after Nustep activity. She demonstrates mild postural instability when performing standing bird dog with table support but able  to maintain standing without assistance. Patient contiues to demonstrate Rt weight shift with balance activities.    Personal Factors and Comorbidities Comorbidity 1    Comorbidities Cancer (current)    Examination-Activity Limitations Sit;Locomotion Level;Transfers    Examination-Participation Restrictions Community Activity;Cleaning    Rehab Potential Good    PT Frequency 1x / week    PT Duration 8 weeks    PT Treatment/Interventions ADLs/Self Care Home Management;Aquatic Therapy;Gait training;Functional mobility training;Therapeutic activities;Therapeutic exercise;Balance training;Neuromuscular re-education;Patient/family education    PT Next Visit Plan continue static and dynamic balance as tolerated, global and functional strength    PT Home Exercise Plan Access Code [email or web address]    Consulted and Agree with Plan of Care Patient             Patient will benefit from skilled therapeutic intervention in order to improve the following deficits and impairments:  Decreased activity tolerance, Abnormal gait, Difficulty walking, Pain  Visit Diagnosis: Difficulty in walking, not elsewhere classified  History of falling     Problem List Patient Active Problem List   Diagnosis Date Noted   Physical debility 03/21/2021   Dizziness 03/21/2021   Vertigo 03/21/2021   Anemia due to antineoplastic chemotherapy 03/21/2021   Skin changes related to chemotherapy 03/21/2021   Other constipation 03/21/2021   Multiple myeloma not having achieved remission ([place]) 01/27/2021   Hordeolum externum of left lower eyelid 06/28/2018   CAD (coronary artery disease) 12/09/2011   Dyslipidemia 12/09/2011   Osteopenia 12/09/2011   Hypothyroid 12/09/2011   Asthma 12/09/2011   Everardo All PT, DPT  06/19/21 10:23 AM    [facility] [address] [place], [place], [address] Phone: [phone]   Fax:  [phone]  Name: Doris Wilkerson MRN: [id number] Date of Birth: 09-09-1946

## 2021-06-21 ENCOUNTER — Inpatient Hospital Stay: Payer: Medicare Other

## 2021-06-21 ENCOUNTER — Other Ambulatory Visit: Payer: Self-pay

## 2021-06-21 VITALS — BP 115/62 | HR 66 | Temp 98.1°F | Resp 18

## 2021-06-21 DIAGNOSIS — Z5112 Encounter for antineoplastic immunotherapy: Secondary | ICD-10-CM | POA: Diagnosis not present

## 2021-06-21 DIAGNOSIS — I10 Essential (primary) hypertension: Secondary | ICD-10-CM | POA: Diagnosis not present

## 2021-06-21 DIAGNOSIS — C9000 Multiple myeloma not having achieved remission: Secondary | ICD-10-CM | POA: Diagnosis not present

## 2021-06-21 DIAGNOSIS — Z79899 Other long term (current) drug therapy: Secondary | ICD-10-CM | POA: Diagnosis not present

## 2021-06-21 LAB — CMP (CANCER CENTER ONLY)
ALT: 23 U/L (ref 0–44)
AST: 18 U/L (ref 15–41)
Albumin: 4.1 g/dL (ref 3.5–5.0)
Alkaline Phosphatase: 52 U/L (ref 38–126)
Anion gap: 6 (ref 5–15)
BUN: 12 mg/dL (ref 8–23)
CO2: 26 mmol/L (ref 22–32)
Calcium: 9.6 mg/dL (ref 8.9–10.3)
Chloride: 106 mmol/L (ref 98–111)
Creatinine: 0.79 mg/dL (ref 0.44–1.00)
GFR, Estimated: >60 mL/min (ref >60)
Glucose, Bld: 86 mg/dL (ref 70–99)
Potassium: 3.9 mmol/L (ref 3.5–5.1)
Sodium: 138 mmol/L (ref 135–145)
Total Bilirubin: 1.1 mg/dL (ref 0.3–1.2)
Total Protein: 6.2 g/dL — ABNORMAL LOW (ref 6.5–8.1)

## 2021-06-21 LAB — CBC WITH DIFFERENTIAL (CANCER CENTER ONLY)
Abs Immature Granulocytes: 0.04 10*3/uL (ref 0.00–0.07)
Basophils Absolute: 0 10*3/uL (ref 0.0–0.1)
Basophils Relative: 1 %
Eosinophils Absolute: 0.3 10*3/uL (ref 0.0–0.5)
Eosinophils Relative: 5 %
HCT: 36.1 % (ref 36.0–46.0)
Hemoglobin: 11.7 g/dL — ABNORMAL LOW (ref 12.0–15.0)
Immature Granulocytes: 1 %
Lymphocytes Relative: 7 %
Lymphs Abs: 0.4 10*3/uL — ABNORMAL LOW (ref 0.7–4.0)
MCH: 30.1 pg (ref 26.0–34.0)
MCHC: 32.4 g/dL (ref 30.0–36.0)
MCV: 92.8 fL (ref 80.0–100.0)
Monocytes Absolute: 0.6 10*3/uL (ref 0.1–1.0)
Monocytes Relative: 10 %
Neutro Abs: 4.8 10*3/uL (ref 1.7–7.7)
Neutrophils Relative %: 76 %
Platelet Count: 193 10*3/uL (ref 150–400)
RBC: 3.89 MIL/uL (ref 3.87–5.11)
RDW: 15.3 % (ref 11.5–15.5)
WBC Count: 6.3 10*3/uL (ref 4.0–10.5)
nRBC: 0 % (ref 0.0–0.2)

## 2021-06-21 LAB — LACTATE DEHYDROGENASE: LDH: 197 U/L — ABNORMAL HIGH (ref 98–192)

## 2021-06-21 MED ORDER — BORTEZOMIB CHEMO SQ INJECTION 3.5 MG (2.5MG/ML)
1.3000 mg/m2 | Freq: Once | INTRAMUSCULAR | Status: AC
  Administered 2021-06-21: 2.25 mg via SUBCUTANEOUS
  Filled 2021-06-21: qty 0.9

## 2021-06-21 NOTE — Patient Instructions (Signed)
[facility]   ?Discharge Instructions: ?Thank you for choosing [facility] to provide your oncology and hematology care.  ? ?If you have a lab appointment with the Cancer Center, please go directly to the Cancer Center and check in at the registration area. ?  ?Wear comfortable clothing and clothing appropriate for easy access to any Portacath or PICC line.  ? ?We strive to give you quality time with your provider. You may need to reschedule your appointment if you arrive late (15 or more minutes).  Arriving late affects you and other patients whose appointments are after yours.  Also, if you miss three or more appointments without notifying the office, you may be dismissed from the clinic at the provider?s discretion.    ?  ?For prescription refill requests, have your pharmacy contact our office and allow 72 hours for refills to be completed.   ? ?Today you received the following chemotherapy and/or immunotherapy agents: bortezomib    ?  ?To help prevent nausea and vomiting after your treatment, we encourage you to take your nausea medication as directed. ? ?BELOW ARE SYMPTOMS THAT SHOULD BE REPORTED IMMEDIATELY: ?*FEVER GREATER THAN 100.4 F (38 ?C) OR HIGHER ?*CHILLS OR SWEATING ?*NAUSEA AND VOMITING THAT IS NOT CONTROLLED WITH YOUR NAUSEA MEDICATION ?*UNUSUAL SHORTNESS OF BREATH ?*UNUSUAL BRUISING OR BLEEDING ?*URINARY PROBLEMS (pain or burning when urinating, or frequent urination) ?*BOWEL PROBLEMS (unusual diarrhea, constipation, pain near the anus) ?TENDERNESS IN MOUTH AND THROAT WITH OR WITHOUT PRESENCE OF ULCERS (sore throat, sores in mouth, or a toothache) ?UNUSUAL RASH, SWELLING OR PAIN  ?UNUSUAL VAGINAL DISCHARGE OR ITCHING  ? ?Items with * indicate a potential emergency and should be followed up as soon as possible or go to the Emergency Department if any problems should occur. ? ?Please show the CHEMOTHERAPY ALERT CARD or IMMUNOTHERAPY ALERT CARD at check-in  to the Emergency Department and triage nurse. ? ?Should you have questions after your visit or need to cancel or reschedule your appointment, please contact [facility]  Dept: [phone]  and follow the prompts.  Office hours are 8:00 a.m. to 4:30 p.m. Monday - Friday. Please note that voicemails left after 4:00 p.m. may not be returned until the following business day.  We are closed weekends and major holidays. You have access to a nurse at all times for urgent questions. Please call the main number to the clinic Dept: [phone] and follow the prompts. ? ? ?For any non-urgent questions, you may also contact your provider using MyChart. We now offer e-Visits for anyone 18 and older to request care online for non-urgent symptoms. For details visit [email or web address]. ?  ?Also download the MyChart app! Go to the app store, search "MyChart", open the app, select [facility], and log in with your MyChart username and password. ? ?Due to Covid, a mask is required upon entering the hospital/clinic. If you do not have a mask, one will be given to you upon arrival. For doctor visits, patients may have 1 support person aged 18 or older with them. For treatment visits, patients cannot have anyone with them due to current Covid guidelines and our immunocompromised population.  ? ?

## 2021-06-25 ENCOUNTER — Other Ambulatory Visit: Payer: Self-pay

## 2021-06-25 ENCOUNTER — Ambulatory Visit (INDEPENDENT_AMBULATORY_CARE_PROVIDER_SITE_OTHER): Payer: Medicare Other | Admitting: Otolaryngology

## 2021-06-25 DIAGNOSIS — R42 Dizziness and giddiness: Secondary | ICD-10-CM | POA: Diagnosis not present

## 2021-06-25 DIAGNOSIS — I2510 Atherosclerotic heart disease of native coronary artery without angina pectoris: Secondary | ICD-10-CM

## 2021-06-25 NOTE — Progress Notes (Signed)
HPI: Doris Wilkerson is a 75 y.o. female who presents is referred by Dr. Alvy Bimler for evaluation of dizziness and vertigo.  She initially had a first episode of vertigo on January 13 when she got out of bed and apparently felt like she was spinning and fell to the floor.  The vertigo or spinning sensation resolved within 10 to 20 seconds and she was able to use the restroom without difficulty.  However she has some persistent dizzy sensation and saw Dr. Nyoka Cowden and was treated with meclizine at that time.  A couple of weeks later she was later diagnosed with multiple myeloma on February 2 and was treated with chemotherapy.  While being treated with chemotherapy she became very weak and has some dizzy symptoms at that point but not the true vertigo.  She has been feeling better over the past month as she is regaining some of her strength.  She has not had any recent episodes of vertigo or spinning sensation. She has had no vertigo or spinning sensation while in bed.  She has had a little dizziness especially when she was weaker.  She has not noted any significant change in her hearing..  Past Medical History:  Diagnosis Date   Asthma    Cataract    GERD (gastroesophageal reflux disease)    Hyperlipidemia    Hypertension    Thyroid disease    Past Surgical History:  Procedure Laterality Date   Cataract Surgery     CORONARY ANGIOPLASTY WITH STENT PLACEMENT     EYE SURGERY     ORTHOPEDIC SURGERY  2012   TONSILLECTOMY  1951   Social History   Socioeconomic History   Marital status: Married    Spouse name: Not on file   Number of children: 0   Years of education: Not on file   Highest education level: Not on file  Occupational History   Occupation: unemployed  Tobacco Use   Smoking status: Never   Smokeless tobacco: Never  Vaping Use   Vaping Use: Never used  Substance and Sexual Activity   Alcohol use: Yes    Alcohol/week: 1.0 standard drink    Types: 1 Glasses of wine per week     Comment: occassionally   Drug use: No   Sexual activity: Not Currently  Other Topics Concern   Not on file  Social History Narrative   Married. Education: college. Pt does exercise-   Social Determinants of Health   Financial Resource Strain: Not on file  Food Insecurity: Not on file  Transportation Needs: Not on file  Physical Activity: Not on file  Stress: Not on file  Social Connections: Not on file   Family History  Problem Relation Age of Onset   Cancer Father    Heart disease Brother    Dementia Brother    Leukemia Brother    Breast cancer Neg Hx    Colon cancer Neg Hx    Esophageal cancer Neg Hx    Pancreatic cancer Neg Hx    Rectal cancer Neg Hx    Stomach cancer Neg Hx    Allergies  Allergen Reactions   Poison Ivy Extract Swelling   Plavix [Clopidogrel Bisulfate]    Other     seasonal   Prior to Admission medications   Medication Sig Start Date End Date Taking? Authorizing Provider  acetaminophen (TYLENOL 8 HOUR ARTHRITIS PAIN) 650 MG CR tablet Take 1 tablet (650 mg total) by mouth every 8 (eight) hours as needed  for pain. 01/03/21   Orson Slick, MD  acyclovir (ZOVIRAX) 400 MG tablet Take 1 tablet (400 mg total) by mouth 2 (two) times daily. 01/30/21   Orson Slick, MD  aspirin 81 MG tablet Take 81 mg by mouth daily.     Hayden Rasmussen, MD  atorvastatin (LIPITOR) 80 MG tablet TAKE 1 TABLET(80 MG) BY MOUTH DAILY AT 6 PM 06/20/21   [name], MD  benazepril (LOTENSIN) 10 MG tablet TAKE 1 TABLET(10 MG) BY MOUTH DAILY 05/20/21   [name], MD  calcium carbonate (OS-CAL) 1250 (500 Ca) MG chewable tablet Chew 1 tablet by mouth daily.    [provider]  cholecalciferol (VITAMIN D3) 25 MCG (1000 UNIT) tablet Take 2,000 Units by mouth daily.    [provider]  dexamethasone (DECADRON) 4 MG tablet TAKE 10 TABLETS BY MOUTH ONCE A WEEK 05/13/21   Orson Slick, MD  DM-APAP-CPM (CORICIDIN HBP PO) Take by mouth.     [provider]  famotidine-calcium carbonate-magnesium hydroxide (PEPCID COMPLETE) 10-800-165 MG chewable tablet Chew 1 tablet by mouth daily.    [provider]  lenalidomide (REVLIMID) 25 MG capsule Take 1 capsule (25 mg total) by mouth daily. Take for 14 days; then none for 7 days. Repeat every 21 days [employer] Auth # [id number]  Date Obtained 06/12/21 06/12/21   Orson Slick, MD  levothyroxine (SYNTHROID) 25 MCG tablet Take 1 tablet (25 mcg total) by mouth daily. 04/20/21   [name], MD  ondansetron (ZOFRAN) 8 MG tablet Take 1 tablet (8 mg total) by mouth every 8 (eight) hours as needed for nausea or vomiting. 01/30/21   Orson Slick, MD  prochlorperazine (COMPAZINE) 10 MG tablet Take 1 tablet (10 mg total) by mouth every 6 (six) hours as needed for nausea or vomiting. 01/30/21   Orson Slick, MD  simethicone (MYLICON) 191 MG chewable tablet Chew 125 mg by mouth every 6 (six) hours as needed for flatulence.    [provider]     Positive ROS: Otherwise negative  All other systems have been reviewed and were otherwise negative with the exception of those mentioned in the HPI and as above.  Physical Exam: Constitutional: Alert, well-appearing, no acute distress Ears: External ears without lesions or tenderness. Ear canals are clear bilaterally.  Both TMs are clear with good mobility on pneumatic otoscopy.  On Dix-Hallpike testing in the office today she had no clinical evidence of BPPV.  On hearing screening with the tuning forks she has slightly diminished hearing in both ears with a 1024 tuning fork but hearing was symmetric and AC > BC bilaterally. Nasal: External nose without lesions. Septum with minimal deformity and mild rhinitis. Clear nasal passages otherwise. Oral: Lips and gums without lesions. Tongue and palate mucosa without lesions. Posterior oropharynx clear. Neck: No palpable adenopathy or masses Respiratory: Breathing comfortably  Skin: No  facial/neck lesions or rash noted.  Procedures  Assessment: Her initial episode of vertigo back in January could have been a case of BPPV.  But on exam today and her recent history she has no present episodes of BPPV. No significant ear abnormality noted on clinical exam although she has some slight diminished hearing consistent with presbycusis.  Plan: I gave her information on BPPV and suggested follow-up here if she has any further episodes of vertigo for recheck as needed. She seems to be getting better with her "dizziness"  associated with the weakness from treatment of her multiple myeloma..  No further work-up is needed at this time.   Radene Journey, MD   CC:

## 2021-06-26 ENCOUNTER — Ambulatory Visit: Payer: Medicare Other | Attending: Hematology and Oncology

## 2021-06-26 DIAGNOSIS — R262 Difficulty in walking, not elsewhere classified: Secondary | ICD-10-CM | POA: Insufficient documentation

## 2021-06-26 DIAGNOSIS — Z9181 History of falling: Secondary | ICD-10-CM | POA: Insufficient documentation

## 2021-06-26 NOTE — Patient Instructions (Signed)
DO's and DON'T's  Avoid and/or Minimize positions of forward bending ( flexion) Side bending and rotation of the trunk Especially when movements occur together   When your back aches:  Don't sit down  Lie down on your back with a small pillow under your head and one under your knees or as outlined by our therapist. Or, lie in the 90/90 position ( on the floor with your feet and legs on the sofa with knees and hips bent to 90 degrees)  Tying or putting on your shoes:  Don't bend over to tie your shoes or put on socks. Instead, bring one foot up, cross it over the opposite knee and bend forward (hinge) at the hips to so the task.  Keep your back straight.  If you cannot do this safely, then you need to use long handled assistive devices such as a shoehorn and sock puller.  Exercising: Don't engage in ballistic types of exercise routines such as high-impact aerobics or jumping rope Don't do exercises in the gym that bring you forward (abdominal crunches, sit-ups, touching your  toes, knee-to-chest, straight leg raising.) Follow a regular exercise program that includes a variety of different weight-bearing activities, such as low-impact aerobics, T' ai chi or walking as your physical therapist advises Do exercises that emphasize return to normal body alignment and strengthening of the muscles that keep your back straight, as outlined in this program or by your therapist  Household tasks: Don't reach unnecessarily or twist your trunk when mopping, sweeping, vacuuming, raking, making beds, weeding gardens, getting objects ou of cupboards, etc. Keep your broom, mop, vacuum, or rake close to you and mover your whole body as you move them. Walk over to the area on which you are working. Arrange kitchen, bathroom, and bedroom shelves so that frequently used items may be reached without excessive bending, twisting, and reaching.  Use a sturdy stool if  necessary. Don't bend from the waist to pick up something up  Off the floor, out of the trunk of your car, or to brush your teeth, wash your face, etc.  Bend at the knees, keeping back straight as possible. Use a reacher if necessary.   Prevention of fracture is the so-called "BOTTOm -Line" in the management of OSTEOPOROSIS. Do not take unnecessary chances in movement. Once a compression fracture occurs, the process is very difficult to control; one fracture is frequently followed by many more.      Osteoporosis   What is Osteoporosis?  A silent disease in which the skeleton is weakened by decreased bone density. Characterized by low bone mass, deterioration of bone, and increased risk of fracture postmenopausal (primary) or the result of an identifiable condition/event (secondary) Commonly found in the wrists, spine, and hips; these are high-risk stress areas and very susceptible to fractures.  The Facts: There are 1.5 million fractures/year 500,000 spine; 250,000 hip with over 60,000 nursing home admissions secondary to hip fracture; and 200,000 wrist After hip fracture, only 50% of people able to walk independently prior to the fracture return to independent ambulation. Bone mass: Peaks at age 46-30, and begins declining at age 27-50.   Osteoporosis is defined by the [facility]) as:  NOF/WHO Criteria for Interpreting Results of Bone Density Assessment  Results Diagnosis  Within 1 standard deviation (SD) of young adult mean Normal  Between 1 and -2.5 SD below mean, repeat in 2 years Low bone mass (osteopenia)  Greater than -2.5 SD below mean Osteoporosis  Greater  than -2.5 SD below mean and one or more fragility fractures exist Severe Osteoporosis  *Results can be affected by positioning of the body in the DEXA scan, presence of current or old fractures, arthritis, extraneous calcifications.    Osteoporosis is not just a women's disease!  30-40% of women  will develop osteoporosis 5-15% of males will develop osteoporosis   What are the risk factors?  Female Thin, small frame Caucasian, Asian race Early menopause (<37 years old)/amenorrhea/delayed puberty Old age Family history (fractures, stooped posture)\ Low calcium diet Sedentary lifestyle Alcohol, Caffeine, Smoking Malnutrition, GI Disease Prolonged use of Glucocorticoids (Prednisone), Meds to treat asthma, arthritis, cancers, thyroid, and anti-seizure meds.  How do I know for sure?  Get a BONE DENSITY TEST!  This measures bone loss and it's painless, non-invasive, and only takes 5-10 minutes!  What can I do about it?  Decrease your risk factors (alcohol, caffeine, smoking) Helpful medications (see next page) Adequate Calcium and Vitamin D intake Get active! Proper posture - Sit and stand tall! No slouching or twisting Weight-Bearing Exercise - walking, stair climbing, elliptical; NO jogging or high-impact exercise. Resistive Exercise - Cybex weight equipment, Nautilus, dumbbells, therabands  **Be sure to maintain proper alignment when lifting any weight!!  **When using equipment, avoid abdominal exercises which involve "crunching" or curling or twisting the trunk, biceps machines, cross-country machines, moving handlebars, or ANY MACHINE WITH ROTATION OR FORWARD BENDING!!!           Approved Pharmacologic Management of Osteoporosis  Agent Approved for prevention Approved for treatment BMD increased spine/hip Fracture reduction  Estrogen/Hormone Therapy (Estrace, Estratab, Ogen, Premarin, Vivell, Prempro, Femhert, Orthoest) Yes Yes 3-6% 35% spine and hip  Bisphosphonates  (Fosamax, Actonel, Boniva) Yes Yes 3-8% 35-50% spine and non-spine  Calcitonin (Miacalcin, Calcimar, Fortical) No Yes 0-3% None stated  Raloxifene (Evista) Yes Yes 2-3% 30-55%  Parathyroid Hormone (Forteo) No Yes, only in those at high risk for fracture None stated 53-65%      Recommended Daily Calcium Intakes   Population Group NIH/NOF* (mg elemental calcium)  Children 1-10 years [phone]  Children 11-24 years 68-1500  Men and Women 25-64 years At least 1200  Pregnant/Lactating At least 1200  Postmenopausal women with hormone replacement therapy At least 1200  Postmenopausal women without   hormone replacement therapy At least 1200  Men and women 65 + At least 1200  *In 1987, 1990, 1994, and 2000, the NIH held consensus conferences on osteoporosis and calcium.  This column shows the most recent recommendations regarding calcium intak for preventing and managing osteoporosis.          Calcium Content of Selected Foods  Dairy Foods Calcium Content (mg) Non-Dairy Foods Calcium Content (mg)  Buttermilk, 1 cup 300 Calcium-fortified juice, 1 cup 300  Milk, 1 cup 300 Salmon, canned with Bones, 2 oz 100  Lactaid milk, 1 cup 300-500 Oysters, raw 13-19 medium 226  Soy milk, 1 cup 200-300 Sardines, canned with bones, 3 oz 372  Yogurt (plain, lowfat) 1 cup 250-300 Shrimp, canned 3 oz 98  Frozen yogurt (fruit) 1 cup 200-600 Collard greens, cooked 1 cup 357  Cheddar, mozzarella, or [place] cheese, 1oz 205 Broccoli, cooked 1 cup 78  Cottage cheese (lowfat) 4 oz 200 Soybeans, cooked 1 cup 131  Part-skim ricotta cheese, 4oz 335 Tofu, 4oz* *  Vanilla ice cream, 1 cup 120-300    *Calcium content of tofu varies depending on processing method; check nutritional label on package for precise calcium content.  Suggested Guidelines for Calcium Supplement Use:  Calcium is absorbed most efficiently if taken in small amounts throughout the day.  Always divide the daily dose into smaller amounts if the total daily dose is '500mg'$  or more per day.  The body cannot use more than '500mg'$  Calcium at any one time. The use of manufactured supplements is encouraged.  Calcium as bone meal or dolomite may contain lead or other heavy metals as  contaminants. Calcium supplements should not be taken with high fiber meals or with bulk forming laxatives. If calcium carbonate is used as the supplement form, it should be taken with meals to assure that stomach acid production is present to facilitate optimal dissolution and absorption of calcium.  This is important if atrophic gastritis with hypo- or achlorhydria is present, which it is in 20-50% of older individuals. It is important to drink plenty of fluids while using the supplement to help reduce problems with side effects like constipation or bloating.  If these symptoms become a problem, switching to another form of supplement may be the answer. Another alternative is calcium-fortified foods, including fruit juices, cereals, and breads.  These foods are now marketed with added calcium and may be less likely to cause side effects. Those with personal or family histories of kidney stones should be monitored to assure that hypercalcuria does not occur. CALCIUM INTAKE QUIZ  Dairy products are the primary source of calcium for most people.  For a quick estimate of your daily calcium intake, complete the following steps:  Use the chart below to determine your daily intake of calcium from diary foods. Servings of dairy per day '1 2 3 4 5 6 7 8  '$ Milligrams (mg) of calcium: [phone] 1250 1500 1750 2000   2.  Enter your total daily calcium intake from dairy foods:     _____mg  3.  Add 350 mg, which is the average for all other dietary sources:                 +            350 mg  4.  The sum of your total daily calcium intake:               ______mg  5.  Enter the recommended calcium intake for your age from the chart below;         ______mg  6.  Enter your daily intake from step 4 above and subtract:                             -        _______mg  7.  The result is how much additional calcium you need:                                          ______mg      Recommended Daily  Calcium Intake  Population Calcium (mg)  Children 1-10 years [phone]  Children 11-24 years 47-1500  Men and women 25-64 At least 1200  Pregnant/Lactating At least 1200  Postmenopausal women with hormone replacement therapy At least 1200  Postmenopausal women without hormone replacement therapy At least 1200  Men and women 65+ At least 1200       SAFETY TIPS FOR FALL PREVENTION   Remove throw rugs  and make certain carpet edges are securely fastened to the floor.  Reduce clutter, especially in traffic areas of the home.   Install/maintain sturdy handrails at stairs.  Increase wattage of lighting in hallways, bathrooms, kitchens, stairwells, and entrances to home.  Use night-lights near bed, in hallways, and in bathroom to improve night safety.  Install safety handrails in shower, tub, and around toilet.  Bathtubs and shower stalls should have non-skid surfaces.  When you must reach for something high, use a safety step stool, one with wide steps and a friction surface to stand on.  A type equipped with a high handrail is preferred.  If a cane or other walking aid has been recommended, use it to help increase your stability.  Wear supportive, cushioned, low-heeled shoes.  Avoid "scuffs" (backless bedroom slippers) and high heels.  Avoid rushing to answer a phone, doorbell, or anything else!  A portable phone that you can take from room to room with you is a good idea for security and safety.  Exercise regularly and stay active!!    [place] www.NOF.org   Exercise for Osteoporosis; A Safe and Effective Way to Build Bone Density and Muscle Strength By: Burnard Hawthorne, M.A.

## 2021-06-26 NOTE — Therapy (Signed)
[facility] [address], [place], [place], [address] Phone: [phone]   Fax:  [phone]  Physical Therapy Treatment  Patient Details  Name: Doris Wilkerson MRN: [id number] Date of Birth: 1946-11-05 Referring Provider (PT): Narda Rutherford, MD   Encounter Date: 06/26/2021   PT End of Session - 06/26/21 1313     Visit Number 12    Date for PT Re-Evaluation 07/19/21    Authorization Type Medicare    Authorization Time Period KX at 15 visits    Progress Note Due on Visit 18    PT Start Time 1232    PT Stop Time 1312    PT Time Calculation (min) 40 min    Activity Tolerance Patient tolerated treatment well;No increased pain    Behavior During Therapy WFL for tasks assessed/performed             Past Medical History:  Diagnosis Date   Asthma    Cataract    GERD (gastroesophageal reflux disease)    Hyperlipidemia    Hypertension    Thyroid disease     Past Surgical History:  Procedure Laterality Date   Cataract Surgery     CORONARY ANGIOPLASTY WITH STENT PLACEMENT     EYE SURGERY     ORTHOPEDIC SURGERY  2012   TONSILLECTOMY  1951    There were no vitals filed for this visit.   Subjective Assessment - 06/26/21 1217     Subjective I am not feeling perky today.  I am trying to stay active.    Currently in Pain? No/denies                               [facility] Adult PT Treatment/Exercise - 06/26/21 0001       Knee/Hip Exercises: Aerobic   Nustep Level 2 x 8 minutes- PT present to discuss progress      Knee/Hip Exercises: Standing   Hip Flexion Right;Left;1 set;10 reps;Knee bent    Hip Flexion Limitations standing on balance pad with alternating step taps    Hip Abduction Stengthening;Both;2 sets;10 reps;Knee straight    Abduction Limitations blue loop around ankles    Hip Extension Stengthening;Both;2 sets;10 reps    Extension Limitations blue band around ankles    Lateral  Step Up Right;Left;1 set;10 reps;Hand Hold: 1    Lateral Step Up Limitations step up to black foam pad    Forward Step Up Right;Left;1 set;10 reps;Hand Hold: 1    Forward Step Up Limitations step up to black foam pad                    PT Education - 06/26/21 1241     Education Details osteoporosis information to serve as guideline to prevent fractures due to underlyling diagnosis that will make her more likely to fracture    Person(s) Educated Patient    Methods Explanation;Handout    Comprehension Verbalized understanding;Returned demonstration              PT Short Term Goals - 05/22/21 1416       PT SHORT TERM GOAL #1   Title Patient will be independent with HEP for continued progression at home.    Time 4    Period Weeks    Status Achieved    Target Date 04/23/21      PT SHORT TERM GOAL #2   Title Patient will complete 6  minute walk test without use of AD with minimal gait impairments and no reports of increased bone pain.    Time 4    Period Weeks    Status Achieved   1190 feet; RPE 4/10; no increased pain   Target Date 04/23/21               PT Long Term Goals - 06/19/21 1014       PT LONG TERM GOAL #2   Title Patient will be independent with advanced HEP for long term management of symptoms post D/C.    Time 8    Period Weeks    Status On-going                   Plan - 06/26/21 1301     Clinical Impression Statement Pt is fatigued generally due to ongoing chemo treatments.  Pt has tried to remain active at home as able.  Pt raised concern regarding her bone heal and higher incidence of fractures and PT spent a portion of the session discussion body mechanics modifications for spinal protection and how to modify daily activities in addition for fall prevention tips.  Pt verbalized understanding and will make modifications at home.  Pt was monitored closely with exercise for fatigue and technique.  Pt will continue to benefit from  skilled PT to address strength, balance and endurance while undergoing treatment for multiple myeloma.    PT Frequency 1x / week    PT Duration 8 weeks    PT Treatment/Interventions ADLs/Self Care Home Management;Aquatic Therapy;Gait training;Functional mobility training;Therapeutic activities;Therapeutic exercise;Balance training;Neuromuscular re-education;Patient/family education    PT Next Visit Plan continue static and dynamic balance as tolerated, global and functional strength    PT Home Exercise Plan Access Code [email or web address]    Consulted and Agree with Plan of Care Patient             Patient will benefit from skilled therapeutic intervention in order to improve the following deficits and impairments:  Decreased activity tolerance, Abnormal gait, Difficulty walking, Pain  Visit Diagnosis: Difficulty in walking, not elsewhere classified  History of falling     Problem List Patient Active Problem List   Diagnosis Date Noted   Physical debility 03/21/2021   Dizziness 03/21/2021   Vertigo 03/21/2021   Anemia due to antineoplastic chemotherapy 03/21/2021   Skin changes related to chemotherapy 03/21/2021   Other constipation 03/21/2021   Multiple myeloma not having achieved remission ([facility]) 01/27/2021   Hordeolum externum of left lower eyelid 06/28/2018   CAD (coronary artery disease) 12/09/2011   Dyslipidemia 12/09/2011   Osteopenia 12/09/2011   Hypothyroid 12/09/2011   Asthma 12/09/2011    Sigurd Sos, PT 06/26/21 1:14 PM   [facility] [address], [place], [place], [address] Phone: [phone]   Fax:  [phone]  Name: Sebastian Dzik MRN: [id number] Date of Birth: Nov 12, 1946

## 2021-06-27 ENCOUNTER — Other Ambulatory Visit: Payer: Self-pay | Admitting: Family Medicine

## 2021-06-27 DIAGNOSIS — E039 Hypothyroidism, unspecified: Secondary | ICD-10-CM

## 2021-06-28 ENCOUNTER — Inpatient Hospital Stay: Payer: Medicare Other

## 2021-06-28 ENCOUNTER — Inpatient Hospital Stay: Payer: Medicare Other | Attending: Hematology and Oncology | Admitting: Hematology and Oncology

## 2021-06-28 ENCOUNTER — Other Ambulatory Visit: Payer: Self-pay

## 2021-06-28 VITALS — BP 129/67 | HR 74 | Temp 98.0°F | Resp 18 | Ht 66.0 in | Wt 151.8 lb

## 2021-06-28 DIAGNOSIS — M899 Disorder of bone, unspecified: Secondary | ICD-10-CM

## 2021-06-28 DIAGNOSIS — I10 Essential (primary) hypertension: Secondary | ICD-10-CM | POA: Insufficient documentation

## 2021-06-28 DIAGNOSIS — C9000 Multiple myeloma not having achieved remission: Secondary | ICD-10-CM | POA: Insufficient documentation

## 2021-06-28 DIAGNOSIS — T451X5A Adverse effect of antineoplastic and immunosuppressive drugs, initial encounter: Secondary | ICD-10-CM

## 2021-06-28 DIAGNOSIS — J45909 Unspecified asthma, uncomplicated: Secondary | ICD-10-CM | POA: Insufficient documentation

## 2021-06-28 DIAGNOSIS — Z7982 Long term (current) use of aspirin: Secondary | ICD-10-CM | POA: Insufficient documentation

## 2021-06-28 DIAGNOSIS — Z79899 Other long term (current) drug therapy: Secondary | ICD-10-CM | POA: Diagnosis not present

## 2021-06-28 DIAGNOSIS — E785 Hyperlipidemia, unspecified: Secondary | ICD-10-CM | POA: Diagnosis not present

## 2021-06-28 DIAGNOSIS — D6481 Anemia due to antineoplastic chemotherapy: Secondary | ICD-10-CM

## 2021-06-28 DIAGNOSIS — R5381 Other malaise: Secondary | ICD-10-CM | POA: Diagnosis not present

## 2021-06-28 DIAGNOSIS — E079 Disorder of thyroid, unspecified: Secondary | ICD-10-CM | POA: Diagnosis not present

## 2021-06-28 DIAGNOSIS — Z5112 Encounter for antineoplastic immunotherapy: Secondary | ICD-10-CM | POA: Insufficient documentation

## 2021-06-28 DIAGNOSIS — Z7952 Long term (current) use of systemic steroids: Secondary | ICD-10-CM | POA: Diagnosis not present

## 2021-06-28 LAB — CMP (CANCER CENTER ONLY)
ALT: 22 U/L (ref 0–44)
AST: 19 U/L (ref 15–41)
Albumin: 4 g/dL (ref 3.5–5.0)
Alkaline Phosphatase: 56 U/L (ref 38–126)
Anion gap: 7 (ref 5–15)
BUN: 15 mg/dL (ref 8–23)
CO2: 22 mmol/L (ref 22–32)
Calcium: 9.5 mg/dL (ref 8.9–10.3)
Chloride: 108 mmol/L (ref 98–111)
Creatinine: 0.76 mg/dL (ref 0.44–1.00)
GFR, Estimated: >60 mL/min (ref >60)
Glucose, Bld: 100 mg/dL — ABNORMAL HIGH (ref 70–99)
Potassium: 4.2 mmol/L (ref 3.5–5.1)
Sodium: 137 mmol/L (ref 135–145)
Total Bilirubin: 0.7 mg/dL (ref 0.3–1.2)
Total Protein: 6 g/dL — ABNORMAL LOW (ref 6.5–8.1)

## 2021-06-28 LAB — CBC WITH DIFFERENTIAL (CANCER CENTER ONLY)
Abs Immature Granulocytes: 0.01 10*3/uL (ref 0.00–0.07)
Basophils Absolute: 0.1 10*3/uL (ref 0.0–0.1)
Basophils Relative: 1 %
Eosinophils Absolute: 0.3 10*3/uL (ref 0.0–0.5)
Eosinophils Relative: 6 %
HCT: 34.1 % — ABNORMAL LOW (ref 36.0–46.0)
Hemoglobin: 11.5 g/dL — ABNORMAL LOW (ref 12.0–15.0)
Immature Granulocytes: 0 %
Lymphocytes Relative: 10 %
Lymphs Abs: 0.5 10*3/uL — ABNORMAL LOW (ref 0.7–4.0)
MCH: 30.5 pg (ref 26.0–34.0)
MCHC: 33.7 g/dL (ref 30.0–36.0)
MCV: 90.5 fL (ref 80.0–100.0)
Monocytes Absolute: 0.7 10*3/uL (ref 0.1–1.0)
Monocytes Relative: 13 %
Neutro Abs: 3.8 10*3/uL (ref 1.7–7.7)
Neutrophils Relative %: 70 %
Platelet Count: 166 10*3/uL (ref 150–400)
RBC: 3.77 MIL/uL — ABNORMAL LOW (ref 3.87–5.11)
RDW: 15.2 % (ref 11.5–15.5)
WBC Count: 5.3 10*3/uL (ref 4.0–10.5)
nRBC: 0 % (ref 0.0–0.2)

## 2021-06-28 LAB — LACTATE DEHYDROGENASE: LDH: 184 U/L (ref 98–192)

## 2021-06-28 MED ORDER — BORTEZOMIB CHEMO SQ INJECTION 3.5 MG (2.5MG/ML)
1.3000 mg/m2 | Freq: Once | INTRAMUSCULAR | Status: AC
  Administered 2021-06-28: 2.25 mg via SUBCUTANEOUS
  Filled 2021-06-28: qty 0.9

## 2021-06-28 MED ORDER — ZOLEDRONIC ACID 4 MG/100ML IV SOLN
4.0000 mg | Freq: Once | INTRAVENOUS | Status: AC
  Administered 2021-06-28: 4 mg via INTRAVENOUS

## 2021-06-28 MED ORDER — ZOLEDRONIC ACID 4 MG/100ML IV SOLN
INTRAVENOUS | Status: AC
  Filled 2021-06-28: qty 100

## 2021-06-28 NOTE — Progress Notes (Signed)
Per  patient, she took the decadron at 0700 this morning.

## 2021-06-28 NOTE — Patient Instructions (Signed)
[facility]  Discharge Instructions: Thank you for choosing [facility] to provide your oncology and hematology care.   If you have a lab appointment with the Cancer Center, please go directly to the Cancer Center and check in at the registration area.   Wear comfortable clothing and clothing appropriate for easy access to any Portacath or PICC line.   We strive to give you quality time with your provider. You may need to reschedule your appointment if you arrive late (15 or more minutes).  Arriving late affects you and other patients whose appointments are after yours.  Also, if you miss three or more appointments without notifying the office, you may be dismissed from the clinic at the provider's discretion.      For prescription refill requests, have your pharmacy contact our office and allow 72 hours for refills to be completed.    Today you received the following chemotherapy and/or immunotherapy agents Velcade       To help prevent nausea and vomiting after your treatment, we encourage you to take your nausea medication as directed.  BELOW ARE SYMPTOMS THAT SHOULD BE REPORTED IMMEDIATELY: *FEVER GREATER THAN 100.4 F (38 C) OR HIGHER *CHILLS OR SWEATING *NAUSEA AND VOMITING THAT IS NOT CONTROLLED WITH YOUR NAUSEA MEDICATION *UNUSUAL SHORTNESS OF BREATH *UNUSUAL BRUISING OR BLEEDING *URINARY PROBLEMS (pain or burning when urinating, or frequent urination) *BOWEL PROBLEMS (unusual diarrhea, constipation, pain near the anus) TENDERNESS IN MOUTH AND THROAT WITH OR WITHOUT PRESENCE OF ULCERS (sore throat, sores in mouth, or a toothache) UNUSUAL RASH, SWELLING OR PAIN  UNUSUAL VAGINAL DISCHARGE OR ITCHING   Items with * indicate a potential emergency and should be followed up as soon as possible or go to the Emergency Department if any problems should occur.  Please show the CHEMOTHERAPY ALERT CARD or IMMUNOTHERAPY ALERT CARD at check-in to  the Emergency Department and triage nurse.  Should you have questions after your visit or need to cancel or reschedule your appointment, please contact [facility]  Dept: [phone]  and follow the prompts.  Office hours are 8:00 a.m. to 4:30 p.m. Monday - Friday. Please note that voicemails left after 4:00 p.m. may not be returned until the following business day.  We are closed weekends and major holidays. You have access to a nurse at all times for urgent questions. Please call the main number to the clinic Dept: [phone] and follow the prompts.   For any non-urgent questions, you may also contact your provider using MyChart. We now offer e-Visits for anyone 18 and older to request care online for non-urgent symptoms. For details visit [email or web address].   Also download the MyChart app! Go to the app store, search "MyChart", open the app, select [facility], and log in with your MyChart username and password.  Due to Covid, a mask is required upon entering the hospital/clinic. If you do not have a mask, one will be given to you upon arrival. For doctor visits, patients may have 1 support person aged 18 or older with them. For treatment visits, patients cannot have anyone with them due to current Covid guidelines and our immunocompromised population.   

## 2021-06-28 NOTE — Progress Notes (Signed)
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 Concerning for multiple myeloma vs metastatic disease 12/31/2020: establish care with Dr. Lorenso Courier. UPEP showed marked Bence Jones protein, findings concerning for multiple myleoma 01/21/2021: bone marrow biopsy confirms multiple myeloma with atypical plasma cells representing 28% of all cells in the aspirate  02/01/2021: Cycle 1 Day 1 of VRd chemotherapy 02/22/2021: Cycle 2 Day 1 of VRd chemotherapy 03/15/2021: Cycle 3 Day 1 of VRd chemotherapy 04/05/2021: Cycle 4 Day 1 of VRd chemotherapy 04/26/2021: Cycle 5 Day 1 of VRd chemotherapy 05/17/2021: Cycle 6 Day 1 of VRd chemotherapy 06/07/2021:  Cycle 7 Day 1 of VRd chemotherapy 06/28/2021: Cycle 8 Day 1 of VRd chemotherapy  Interval History:  Doris Wilkerson 75 y.o. female with medical history significant for lambda light chain multiple myeloma who presents for a follow up visit. The patient's last visit was on 06/14/2021. In the interim since the last visit she continued on VRd chemotherapy.   On exam today Doris Wilkerson reports she is excited to be starting maintenance therapy at the end of cycle 8.  She notes that she is doing great today but was having a relatively rough week.  She had an increase in extremity numbness which progresses throughout the day but in the morning the numbness is gone.  She fortunately is not having any pain or tingling.  She notes that she felt bad on Sunday through Tuesday and she may be was "sick with something".  She has a little bit of a headache today.  She notes that she is  ambulating well and otherwise has no questions concerns or complaints.  She denies any fevers, chills, sweats, nausea, vomiting or diarrhea. A full 10 point ROS is listed below.   MEDICAL HISTORY:  Past Medical History:  Diagnosis Date   Asthma    Cataract    GERD (gastroesophageal reflux disease)    Hyperlipidemia    Hypertension    Thyroid disease     SURGICAL HISTORY: Past Surgical History:  Procedure Laterality Date   Cataract Surgery     CORONARY ANGIOPLASTY WITH STENT PLACEMENT     EYE SURGERY     ORTHOPEDIC SURGERY  2012   TONSILLECTOMY  1951    SOCIAL HISTORY: Social History   Socioeconomic History   Marital status: Married    Spouse name: Not on file   Number of children: 0   Years of education: Not on file   Highest education level: Not on file  Occupational History   Occupation: unemployed  Tobacco Use   Smoking status: Never   Smokeless tobacco: Never  Vaping Use   Vaping Use: Never used  Substance and Sexual Activity   Alcohol use: Yes    Alcohol/week: 1.0 standard drink    Types: 1 Glasses of wine per week    Comment: occassionally   Drug use: No   Sexual activity: Not Currently  Other Topics Concern   Not on file  Social History Narrative   Married. Education: college. Pt does exercise-   Social Determinants of Health   Financial Resource Strain: Not on file  Food Insecurity: Not on file  Transportation Needs: Not on file  Physical Activity: Not on file  Stress: Not on file  Social Connections: Not on file  Intimate Partner Violence: Not on file    FAMILY HISTORY: Family History  Problem Relation Age of Onset   Cancer Father    Heart disease Brother    Dementia Brother    Leukemia Brother    Breast cancer Neg Hx    Colon cancer Neg Hx    Esophageal cancer Neg Hx    Pancreatic cancer Neg Hx    Rectal cancer Neg Hx    Stomach cancer Neg Hx     ALLERGIES:  is allergic to poison ivy extract, plavix [clopidogrel bisulfate], and  other.  MEDICATIONS:  Current Outpatient Medications  Medication Sig Dispense Refill   acetaminophen (TYLENOL 8 HOUR ARTHRITIS PAIN) 650 MG CR tablet Take 1 tablet (650 mg total) by mouth every 8 (eight) hours as needed for pain.     acyclovir (ZOVIRAX) 400 MG tablet Take 1 tablet (400 mg total) by mouth 2 (two) times daily. 60 tablet 5   aspirin 81 MG tablet Take 81 mg by mouth daily.      atorvastatin (LIPITOR) 80 MG tablet TAKE 1 TABLET(80 MG) BY MOUTH DAILY AT 6 PM 90 tablet 1   benazepril (LOTENSIN) 10 MG tablet TAKE 1 TABLET(10 MG) BY MOUTH DAILY 90 tablet 1   calcium carbonate (OS-CAL) 1250 (500 Ca) MG chewable tablet Chew 1 tablet by mouth daily.     cholecalciferol (VITAMIN D3) 25 MCG (1000 UNIT) tablet Take 2,000 Units by mouth daily.     dexamethasone (DECADRON) 4 MG tablet TAKE 10 TABLETS BY MOUTH ONCE A WEEK 40 tablet 3   DM-APAP-CPM (CORICIDIN HBP PO) Take by mouth.      famotidine-calcium carbonate-magnesium hydroxide (PEPCID COMPLETE) 10-800-165 MG chewable tablet Chew 1 tablet by mouth daily.     lenalidomide (REVLIMID) 25 MG capsule Take 1 capsule (25 mg total) by mouth daily. Take for 14 days; then none for 7 days. Repeat every 21 days [employer] Auth # [id number]  Date Obtained 06/12/21 14 capsule 0   levothyroxine (SYNTHROID) 25 MCG tablet TAKE 1 TABLET(25 MCG) BY MOUTH DAILY BEFORE BREAKFAST 90 tablet 0   ondansetron (ZOFRAN) 8 MG tablet Take 1 tablet (8 mg total) by mouth every 8 (eight) hours as needed for nausea or vomiting. 30 tablet 0   prochlorperazine (COMPAZINE) 10 MG tablet Take 1 tablet (10 mg total) by mouth every 6 (six) hours as needed for nausea or vomiting. 30 tablet 0   simethicone (MYLICON) 540 MG chewable tablet Chew 125 mg by mouth every 6 (six) hours as needed for flatulence.     No current facility-administered medications for this visit.   Facility-Administered Medications Ordered in Other Visits  Medication Dose Route Frequency Provider Last Rate Last  Admin   bortezomib SQ (VELCADE) chemo injection (2.49m/mL concentration) 2.25 mg  1.3 mg/m2 (Treatment Plan Recorded) Subcutaneous Once DOrson Slick MD        REVIEW OF SYSTEMS:   Constitutional: ( - ) fevers, ( - )  chills , ( - ) night sweats Eyes: ( - ) blurriness of vision, ( - ) double vision, ( - ) watery eyes Ears, nose, mouth, throat, and face: ( - ) mucositis, ( - ) sore throat Respiratory: ( - ) cough, ( - ) dyspnea, ( - ) wheezes Cardiovascular: ( - ) palpitation, ( - ) chest discomfort, ( - ) lower  extremity swelling Gastrointestinal:  ( - ) nausea, ( - ) heartburn, ( - ) change in bowel habits Skin: ( - ) abnormal skin rashes Lymphatics: ( - ) new lymphadenopathy, ( - ) easy bruising Neurological: ( - ) numbness, ( - ) tingling, ( - ) new weaknesses Behavioral/Psych: ( - ) mood change, ( - ) new changes  All other systems were reviewed with the patient and are negative.  PHYSICAL EXAMINATION: ECOG PERFORMANCE STATUS: 1 - Symptomatic but completely ambulatory  Vitals:   06/28/21 0906  BP: 129/67  Pulse: 74  Resp: 18  Temp: 98 F (36.7 C)  SpO2: 100%    Filed Weights   06/28/21 0906  Weight: 151 lb 12.8 oz (68.9 kg)     GENERAL: well appearing elderly Caucasian female in NAD  SKIN: skin color, texture, turgor are normal, no rashes or significant lesions EYES: conjunctiva are pink and non-injected, sclera clear LUNGS: clear to auscultation and percussion with normal breathing effort HEART: regular rate & rhythm and no murmurs and trace lower extremity edema Musculoskeletal: no cyanosis of digits and no clubbing  PSYCH: alert & oriented x 3, fluent speech NEURO: no focal motor/sensory deficits  LABORATORY DATA:  I have reviewed the data as listed CBC Latest Ref Rng & Units 06/28/2021 06/21/2021 06/14/2021  WBC 4.0 - 10.5 K/uL 5.3 6.3 4.8  Hemoglobin 12.0 - 15.0 g/dL 11.5(L) 11.7(L) 11.5(L)  Hematocrit 36.0 - 46.0 % 34.1(L) 36.1 35.2(L)  Platelets 150 -  400 K/uL 166 193 184    CMP Latest Ref Rng & Units 06/28/2021 06/21/2021 06/14/2021  Glucose 70 - 99 mg/dL 100(H) 86 88  BUN 8 - 23 mg/dL '15 12 12  ' Creatinine 0.44 - 1.00 mg/dL 0.76 0.79 0.72  Sodium 135 - 145 mmol/L 137 138 140  Potassium 3.5 - 5.1 mmol/L 4.2 3.9 3.9  Chloride 98 - 111 mmol/L 108 106 104  CO2 22 - 32 mmol/L '22 26 27  ' Calcium 8.9 - 10.3 mg/dL 9.5 9.6 10.0  Total Protein 6.5 - 8.1 g/dL 6.0(L) 6.2(L) 6.4(L)  Total Bilirubin 0.3 - 1.2 mg/dL 0.7 1.1 0.9  Alkaline Phos 38 - 126 U/L 56 52 45  AST 15 - 41 U/L '19 18 22  ' ALT 0 - 44 U/L '22 23 26    ' Lab Results  Component Value Date   MPROTEIN Not Observed 05/24/2021   MPROTEIN Not Observed 05/03/2021   MPROTEIN Not Observed 03/29/2021   Lab Results  Component Value Date   KPAFRELGTCHN 16.1 05/24/2021   KPAFRELGTCHN 19.7 (H) 05/03/2021   KPAFRELGTCHN 14.9 04/05/2021   LAMBDASER 33.8 (H) 05/24/2021   LAMBDASER 51.7 (H) 05/03/2021   LAMBDASER 74.6 (H) 04/05/2021   KAPLAMBRATIO 5.93 05/31/2021   KAPLAMBRATIO 0.48 05/24/2021   KAPLAMBRATIO 6.01 05/03/2021    RADIOGRAPHIC STUDIES: No results found.  ASSESSMENT & PLAN Doris Wilkerson 75 y.o. female with medical history significant for lambda light chain multiple myeloma who presents for a follow up visit.   After review the labs, review the records, schedule the patient the findings most consistent with a lambda light chain multiple myeloma.  This is getting confirmed with a bone marrow biopsy showing an abnormal plasma cell population of 28% and M protein/Bence-Jones proteins in the urine.  Given these findings the patient now carries a diagnosis of lambda light chain multiple myeloma.  R-ISS: Stage II (high risk genetics)  We will proceed with VRd chemotherapy.  This consists of bortezomib 1.3 mg per metered  squared q week, lenalidomide 25 mg p.o. x14 days of 21-day cycle, and dexamethasone 40 mg p.o. q. weekly.  This will be continued until he reached a VGPR/ complete  8 cycles of triplet therapy, at which time we can consider transition over to maintenance Revlimid 10 mg p.o. daily 21 of 28 days per cycle.  Ms. Kotter is not a candidate for bone marrow transplant based on her advanced age.  # Lambda Light Chain Multiple Myeloma --diagnosis confirmed with bone marrow biopsy and urine protein analysis.   --patient has good functional status and would be an excellent candidate for VRd chemotherapy. I do not believe she would be a bone marrow transplant candidate based on her age.  --assure monthly restaging labs with SPEP, UPEP, and SFLC --patient declines port placement at this time --Cycle 1 Day 1 of therapy started on 02/01/2021.  --today is Cycle 8 Day 1 of VRd --excellent response noted within 1st cycle, normalization of urine free light chains and undetectable M protein.  --can consider transition to maintenance therapy once her SFLC have normalized and she has completed 8 full cycles of triplet therapy. She is rapidly approaching that point.  --RTC in 3 weeks with continued weekly therapy. Will transition to maintenance therapy at that time  #Supportive Care --chemotherapy education complete  --patient declines port placement at this time --zofran 25m q8H PRN and compazine 146mPO q6H for nausea --acyclovir 40017mO BID for VCZ prophylaxis --zometa therapy performed on 03/29/2021. Repeat q 3 months.  -- no pain medication required at this time.   No orders of the defined types were placed in this encounter.  All questions were answered. The patient knows to call the clinic with any problems, questions or concerns.  A total of more than 30 minutes were spent on this encounter and over half of that time was spent on counseling and coordination of care as outlined above.   JohLedell PeoplesD Department of Hematology/Oncology [facility] Hospitalone: [phone]: [phone]:  [email or web address]  06/28/2021 10:43 AM   Literature Support:  1) SuzBennett Scrapertezomib, lenalidomide, and dexamethasone in transplant-eligible newly diagnosed multiple myeloma patients: a multicenter retrospective comparative analysis. Int J HHinton Dyer020 Jan;111(1):103-111. doi: 10.1007/s12185-019-02764-1.  -- Overall response, very good partial response, and complete response rates after VRD were 96.4%, 45.5%, and 20.0%, respectively (median follow-up period, 17.7 months). The 1-year progression-free survival (PFS) and overall survival rates were 95.8% and 98.2%, respectively. The response rate and PFS were similar between the groups, regardless of cytogenetic risk and age.  2) JacCasandra Doffingav[address], Pawlyn C, Loletha GrayeraiKahokatrSchellsburgolDalton Gardensockaday A, Jones JR, Kishore B, Garg M, Williams CD, Karunanithi K, Delaine Lame, CooLaban Emperor, KaiKent Acresrayson MT, OweRobinette Haines, MorMyra RudeK [place]. Lenalidomide maintenance versus observation for patients with newly diagnosed multiple myeloma (Myeloma XI): a multicentre, open-label, randomised, phase 3 trial. Lancet Oncol. 2019 Jan;20(1):57-73. doi: 10.1016/S1470-2045(18)30687-9. Epub 2018 Dec 14.   --Maintenance therapy with lenalidomide significantly improved progression-free survival in patients with newly diagnosed multiple myeloma compared with observation   --After a median follow-up of 31 months (IQR 18-50), median progression-free survival was 39 months (95% CI 36-42) with lenalidomide and 20 months (18-22) with observation (hazard ratio [HR] 046 [95% CI 041-053];  p<00001), and 3-year overall survival was 786% (95% Cl 756-816) in the lenalidomide group and 758% (458-483) in the observation group (HR 087 [95% CI 073-105]; p=015). Progression-free survival  was improved with lenalidomide compared with observation across all prespecified subgroups.

## 2021-07-01 LAB — KAPPA/LAMBDA LIGHT CHAINS
Kappa free light chain: 14.5 mg/L (ref 3.3–19.4)
Kappa, lambda light chain ratio: 0.56 (ref 0.26–1.65)
Lambda free light chains: 25.8 mg/L (ref 5.7–26.3)

## 2021-07-02 LAB — MULTIPLE MYELOMA PANEL, SERUM
Albumin SerPl Elph-Mcnc: 4.2 g/dL (ref 2.9–4.4)
Albumin/Glob SerPl: 2.5 — ABNORMAL HIGH (ref 0.7–1.7)
Alpha 1: 0.3 g/dL (ref 0.0–0.4)
Alpha2 Glob SerPl Elph-Mcnc: 0.5 g/dL (ref 0.4–1.0)
B-Globulin SerPl Elph-Mcnc: 0.7 g/dL (ref 0.7–1.3)
Gamma Glob SerPl Elph-Mcnc: 0.3 g/dL — ABNORMAL LOW (ref 0.4–1.8)
Globulin, Total: 1.7 g/dL — ABNORMAL LOW (ref 2.2–3.9)
IgA: 58 mg/dL — ABNORMAL LOW (ref 64–422)
IgG (Immunoglobin G), Serum: 386 mg/dL — ABNORMAL LOW (ref 586–1602)
IgM (Immunoglobulin M), Srm: 15 mg/dL — ABNORMAL LOW (ref 26–217)
Total Protein ELP: 5.9 g/dL — ABNORMAL LOW (ref 6.0–8.5)

## 2021-07-03 ENCOUNTER — Encounter: Payer: Self-pay | Admitting: Hematology and Oncology

## 2021-07-03 ENCOUNTER — Other Ambulatory Visit: Payer: Self-pay

## 2021-07-03 ENCOUNTER — Ambulatory Visit: Payer: Medicare Other | Admitting: Physical Therapy

## 2021-07-03 DIAGNOSIS — R262 Difficulty in walking, not elsewhere classified: Secondary | ICD-10-CM | POA: Diagnosis not present

## 2021-07-03 DIAGNOSIS — Z9181 History of falling: Secondary | ICD-10-CM | POA: Diagnosis not present

## 2021-07-03 NOTE — Therapy (Signed)
[facility] [address] [place], [place], [address] Phone: [phone]   Fax:  [phone]  Physical Therapy Treatment  Patient Details  Name: Doris Wilkerson MRN: [id number] Date of Birth: 1945/12/31 Referring Provider (PT): Narda Rutherford, MD   Encounter Date: 07/03/2021   PT End of Session - 07/03/21 1112     Visit Number 13    Date for PT Re-Evaluation 07/19/21    Authorization Type Medicare    Authorization Time Period KX at 15 visits    Progress Note Due on Visit 18    PT Start Time 0932    PT Stop Time 1011    PT Time Calculation (min) 39 min    Activity Tolerance Patient tolerated treatment well;No increased pain    Behavior During Therapy WFL for tasks assessed/performed             Past Medical History:  Diagnosis Date   Asthma    Cataract    GERD (gastroesophageal reflux disease)    Hyperlipidemia    Hypertension    Thyroid disease     Past Surgical History:  Procedure Laterality Date   Cataract Surgery     CORONARY ANGIOPLASTY WITH STENT PLACEMENT     EYE SURGERY     ORTHOPEDIC SURGERY  2012   TONSILLECTOMY  1951    There were no vitals filed for this visit.   Subjective Assessment - 07/03/21 1108     Subjective Patient reports increased pain at left ischial tuberosity this date.    Pertinent History multiple myeloma (current)    Limitations Sitting;Walking;Standing    How long can you sit comfortably? 20 minutes with extra cushioning    How long can you stand comfortably? unlimited    How long can you walk comfortably? 15-20 minutes    Patient Stated Goals more mobile; decreased falls; get through remainder of chemotherapy (2 more treatmetns)    Currently in Pain? Yes    Pain Score 4     Pain Location Pelvis    Pain Descriptors / Indicators Discomfort;Aching    Pain Type Chronic pain    Pain Onset More than a month ago    Pain Frequency Intermittent                                [facility] Adult PT Treatment/Exercise - 07/03/21 0001       Lumbar Exercises: Standing   Row Power tower;Both;15 reps    Row Limitations 20#; standing on black foam    Shoulder Extension Power Tower;Both;15 reps    Shoulder Extension Limitations 15#; standing on black foam      Lumbar Exercises: Seated   Other Seated Lumbar Exercises seated on gray disc; single dumbbell shoulder press; 3# x 15 repetitions      Lumbar Exercises: Quadruped   Opposite Arm/Leg Raise Right arm/Left leg;Left arm/Right leg;10 reps;3 seconds    Opposite Arm/Leg Raise Limitations standing at raised table      Knee/Hip Exercises: Aerobic   Nustep L2 x 10 minutes      Knee/Hip Exercises: Standing   Lateral Step Up Right;Left;1 set;15 reps;Hand Hold: 1;Step Height: 4"    Forward Step Up Right;Left;1 set;15 reps;Hand Hold: 1;Step Height: 4"                      PT Short Term Goals - 05/22/21 1416  PT SHORT TERM GOAL #1   Title Patient will be independent with HEP for continued progression at home.    Time 4    Period Weeks    Status Achieved    Target Date 04/23/21      PT SHORT TERM GOAL #2   Title Patient will complete 6 minute walk test without use of AD with minimal gait impairments and no reports of increased bone pain.    Time 4    Period Weeks    Status Achieved   1190 feet; RPE 4/10; no increased pain   Target Date 04/23/21               PT Long Term Goals - 07/03/21 1111       PT LONG TERM GOAL #2   Title Patient will be independent with advanced HEP for long term management of symptoms post D/C.    Baseline doing exercises as able- side effects from chemo    Time 8    Period Weeks    Status On-going                   Plan - 07/03/21 1109     Clinical Impression Statement Patient reports contiued buttock pain that radiates from ischial tuberosity. Discussed use of lumbar support to assist with weight shift when  seated for prolonged periods. Patient demonstrates improved core recruitment as she completed standing core exercises on black foam without external support.    Personal Factors and Comorbidities Comorbidity 1    Comorbidities Cancer (current)    Examination-Activity Limitations Sit;Locomotion Level;Transfers    Examination-Participation Restrictions Community Activity;Cleaning    Rehab Potential Good    PT Frequency 1x / week    PT Duration 8 weeks    PT Treatment/Interventions ADLs/Self Care Home Management;Aquatic Therapy;Gait training;Functional mobility training;Therapeutic activities;Therapeutic exercise;Balance training;Neuromuscular re-education;Patient/family education    PT Next Visit Plan progress static and dynamic balance as tolerated, global and functional strength    PT Home Exercise Plan Access Code [email or web address]    Consulted and Agree with Plan of Care Patient             Patient will benefit from skilled therapeutic intervention in order to improve the following deficits and impairments:  Decreased activity tolerance, Abnormal gait, Difficulty walking, Pain  Visit Diagnosis: Difficulty in walking, not elsewhere classified  History of falling     Problem List Patient Active Problem List   Diagnosis Date Noted   Physical debility 03/21/2021   Dizziness 03/21/2021   Vertigo 03/21/2021   Anemia due to antineoplastic chemotherapy 03/21/2021   Skin changes related to chemotherapy 03/21/2021   Other constipation 03/21/2021   Multiple myeloma not having achieved remission ([place]) 01/27/2021   Hordeolum externum of left lower eyelid 06/28/2018   CAD (coronary artery disease) 12/09/2011   Dyslipidemia 12/09/2011   Osteopenia 12/09/2011   Hypothyroid 12/09/2011   Asthma 12/09/2011   Everardo All PT, DPT  07/03/21 11:13 AM   [facility] [address] [place], [place], [address] Phone: [phone]   Fax:   [phone]  Name: Doris Wilkerson MRN: [id number] Date of Birth: 1945-12-22

## 2021-07-04 ENCOUNTER — Other Ambulatory Visit: Payer: Self-pay | Admitting: *Deleted

## 2021-07-04 DIAGNOSIS — C9000 Multiple myeloma not having achieved remission: Secondary | ICD-10-CM

## 2021-07-04 MED ORDER — LENALIDOMIDE 10 MG PO CAPS: 10.0000 mg | ORAL_CAPSULE | Freq: Every day | ORAL | 0 refills | Status: DC

## 2021-07-05 ENCOUNTER — Inpatient Hospital Stay: Payer: Medicare Other

## 2021-07-05 ENCOUNTER — Encounter: Payer: Self-pay | Admitting: Hematology and Oncology

## 2021-07-05 ENCOUNTER — Other Ambulatory Visit: Payer: Self-pay

## 2021-07-05 VITALS — BP 132/71 | HR 73 | Temp 98.6°F | Resp 18 | Wt 149.0 lb

## 2021-07-05 DIAGNOSIS — J45909 Unspecified asthma, uncomplicated: Secondary | ICD-10-CM | POA: Diagnosis not present

## 2021-07-05 DIAGNOSIS — C9000 Multiple myeloma not having achieved remission: Secondary | ICD-10-CM | POA: Diagnosis not present

## 2021-07-05 DIAGNOSIS — E785 Hyperlipidemia, unspecified: Secondary | ICD-10-CM | POA: Diagnosis not present

## 2021-07-05 DIAGNOSIS — E079 Disorder of thyroid, unspecified: Secondary | ICD-10-CM | POA: Diagnosis not present

## 2021-07-05 DIAGNOSIS — I10 Essential (primary) hypertension: Secondary | ICD-10-CM | POA: Diagnosis not present

## 2021-07-05 DIAGNOSIS — Z5112 Encounter for antineoplastic immunotherapy: Secondary | ICD-10-CM | POA: Diagnosis not present

## 2021-07-05 LAB — CBC WITH DIFFERENTIAL (CANCER CENTER ONLY)
Abs Immature Granulocytes: 0.02 10*3/uL (ref 0.00–0.07)
Basophils Absolute: 0.1 10*3/uL (ref 0.0–0.1)
Basophils Relative: 1 %
Eosinophils Absolute: 0.4 10*3/uL (ref 0.0–0.5)
Eosinophils Relative: 6 %
HCT: 35.4 % — ABNORMAL LOW (ref 36.0–46.0)
Hemoglobin: 11.7 g/dL — ABNORMAL LOW (ref 12.0–15.0)
Immature Granulocytes: 0 %
Lymphocytes Relative: 6 %
Lymphs Abs: 0.5 10*3/uL — ABNORMAL LOW (ref 0.7–4.0)
MCH: 30.2 pg (ref 26.0–34.0)
MCHC: 33.1 g/dL (ref 30.0–36.0)
MCV: 91.5 fL (ref 80.0–100.0)
Monocytes Absolute: 0.4 10*3/uL (ref 0.1–1.0)
Monocytes Relative: 5 %
Neutro Abs: 6.1 10*3/uL (ref 1.7–7.7)
Neutrophils Relative %: 82 %
Platelet Count: 221 10*3/uL (ref 150–400)
RBC: 3.87 MIL/uL (ref 3.87–5.11)
RDW: 15.6 % — ABNORMAL HIGH (ref 11.5–15.5)
WBC Count: 7.5 10*3/uL (ref 4.0–10.5)
nRBC: 0 % (ref 0.0–0.2)

## 2021-07-05 LAB — CMP (CANCER CENTER ONLY)
ALT: 24 U/L (ref 0–44)
AST: 18 U/L (ref 15–41)
Albumin: 4 g/dL (ref 3.5–5.0)
Alkaline Phosphatase: 62 U/L (ref 38–126)
Anion gap: 9 (ref 5–15)
BUN: 10 mg/dL (ref 8–23)
CO2: 26 mmol/L (ref 22–32)
Calcium: 9.7 mg/dL (ref 8.9–10.3)
Chloride: 104 mmol/L (ref 98–111)
Creatinine: 0.77 mg/dL (ref 0.44–1.00)
GFR, Estimated: >60 mL/min
Glucose, Bld: 80 mg/dL (ref 70–99)
Potassium: 3.7 mmol/L (ref 3.5–5.1)
Sodium: 139 mmol/L (ref 135–145)
Total Bilirubin: 0.7 mg/dL (ref 0.3–1.2)
Total Protein: 6.4 g/dL — ABNORMAL LOW (ref 6.5–8.1)

## 2021-07-05 LAB — LACTATE DEHYDROGENASE: LDH: 194 U/L — ABNORMAL HIGH (ref 98–192)

## 2021-07-05 MED ORDER — BORTEZOMIB CHEMO SQ INJECTION 3.5 MG (2.5MG/ML)
1.3000 mg/m2 | Freq: Once | INTRAMUSCULAR | Status: AC
  Administered 2021-07-05: 2.25 mg via SUBCUTANEOUS
  Filled 2021-07-05: qty 0.9

## 2021-07-09 LAB — UPEP/UIFE/LIGHT CHAINS/TP, 24-HR UR
% BETA, Urine: 0 %
ALPHA 1 URINE: 0 %
Albumin, U: 100 %
Alpha 2, Urine: 0 %
Free Kappa Lt Chains,Ur: 8.52 mg/L (ref 1.17–86.46)
Free Kappa/Lambda Ratio: 7.1 (ref 1.83–14.26)
Free Lambda Lt Chains,Ur: 1.2 mg/L (ref 0.27–15.21)
GAMMA GLOBULIN URINE: 0 %
Total Protein, Urine-Ur/day: <118 mg/24 hr (ref 30–150)
Total Protein, Urine: <4 mg/dL
Total Volume: 2950

## 2021-07-10 ENCOUNTER — Other Ambulatory Visit: Payer: Self-pay

## 2021-07-10 ENCOUNTER — Ambulatory Visit: Payer: Medicare Other | Admitting: Physical Therapy

## 2021-07-10 DIAGNOSIS — Z9181 History of falling: Secondary | ICD-10-CM | POA: Diagnosis not present

## 2021-07-10 DIAGNOSIS — R262 Difficulty in walking, not elsewhere classified: Secondary | ICD-10-CM | POA: Diagnosis not present

## 2021-07-10 NOTE — Therapy (Signed)
[facility] [address] [place], [place], [address] Phone: [phone]   Fax:  [phone]  Physical Therapy Treatment  Patient Details  Name: Doris Wilkerson MRN: [id number] Date of Birth: [birth year] Referring Provider (PT): Narda Rutherford, MD   Encounter Date: 07/10/2021   PT End of Session - 07/10/21 1211     Visit Number 14    Date for PT Re-Evaluation 07/19/21    Authorization Type Medicare    Authorization Time Period KX at 15 visits    Progress Note Due on Visit 18    PT Start Time 0934    PT Stop Time 1012    PT Time Calculation (min) 38 min    Activity Tolerance Patient tolerated treatment well;No increased pain    Behavior During Therapy WFL for tasks assessed/performed             Past Medical History:  Diagnosis Date   Asthma    Cataract    GERD (gastroesophageal reflux disease)    Hyperlipidemia    Hypertension    Thyroid disease     Past Surgical History:  Procedure Laterality Date   Cataract Surgery     CORONARY ANGIOPLASTY WITH STENT PLACEMENT     EYE SURGERY     ORTHOPEDIC SURGERY  2012   TONSILLECTOMY  1951    There were no vitals filed for this visit.   Subjective Assessment - 07/10/21 1208     Subjective Had chemo last Friday so has had a lot of negative side effects which causes fatigue. Final chemo treatment is Friday.    Pertinent History multiple myeloma (current)    Limitations Sitting;Walking;Standing    How long can you sit comfortably? 20 minutes with extra cushioning    How long can you stand comfortably? unlimited    How long can you walk comfortably? 15-20 minutes    Patient Stated Goals more mobile; decreased falls; get through remainder of chemotherapy (2 more treatmetns)    Currently in Pain? No/denies                               [facility] Adult PT Treatment/Exercise - 07/10/21 0001       Neuro Re-ed    Neuro Re-ed Details  staggered  stance weight shifts on black foam 2 x 60s trial; normal stance lateral weight shifts on black foam, 2 x 60s  trial      Lumbar Exercises: Standing   Row Power tower;Both;15 reps    Row Limitations 20#; seated on blue ball    Shoulder Extension Power Tower;Both;15 reps    Shoulder Extension Limitations 15#; seated on blue ball      Lumbar Exercises: Seated   Other Seated Lumbar Exercises seated on blue ball: antirotation press, green tband, x15 Lt/Rt      Lumbar Exercises: Quadruped   Opposite Arm/Leg Raise Right arm/Left leg;Left arm/Right leg;10 reps;3 seconds    Opposite Arm/Leg Raise Limitations standing at raised table      Knee/Hip Exercises: Aerobic   Recumbent Bike L2 x 7 minutes; PT present to discuss progress and assess response to activity                      PT Short Term Goals - 05/22/21 1416       PT SHORT TERM GOAL #1   Title Patient will be independent with HEP for continued  progression at home.    Time 4    Period Weeks    Status Achieved    Target Date 04/23/21      PT SHORT TERM GOAL #2   Title Patient will complete 6 minute walk test without use of AD with minimal gait impairments and no reports of increased bone pain.    Time 4    Period Weeks    Status Achieved   1190 feet; RPE 4/10; no increased pain   Target Date 04/23/21               PT Long Term Goals - 07/10/21 1211       PT LONG TERM GOAL #1   Title Patient will improve FOTO score to 60 to indicate improved overall function.    Time 8    Period Weeks    Status On-going      PT LONG TERM GOAL #2   Title Patient will be independent with advanced HEP for long term management of symptoms post D/C.    Baseline doing exercises as able- side effects from chemo    Time 8    Period Weeks    Status Achieved      PT LONG TERM GOAL #3   Title Patient will complete five time sit to stand in </= 13s to indicate decreased fall risk.    Baseline 18s    Time 8    Period Weeks     Status Achieved      PT LONG TERM GOAL #4   Title Patient will score >/= 22/24 on DGI to indicate decreased fall risk.    Baseline 20    Time 8    Period Weeks    Status On-going                   Plan - 07/10/21 1209     Clinical Impression Statement Patient demonstrates improved awareness of neutral spine when performing standing bird dog activity. She continues to report decreased confidence with standing weight shifts though she displays no overt loss of balance.    Personal Factors and Comorbidities Comorbidity 1    Comorbidities Cancer (current)    Examination-Activity Limitations Sit;Locomotion Level;Transfers    Examination-Participation Restrictions Community Activity;Cleaning    Rehab Potential Good    PT Frequency 1x / week    PT Duration 8 weeks    PT Treatment/Interventions ADLs/Self Care Home Management;Aquatic Therapy;Gait training;Functional mobility training;Therapeutic activities;Therapeutic exercise;Balance training;Neuromuscular re-education;Patient/family education    PT Next Visit Plan continue balance progressions; functional strengthening to patient tolerance and within safe parameters    PT Home Exercise Plan Access Code [email or web address]    Consulted and Agree with Plan of Care Patient             Patient will benefit from skilled therapeutic intervention in order to improve the following deficits and impairments:  Decreased activity tolerance, Abnormal gait, Difficulty walking, Pain  Visit Diagnosis: Difficulty in walking, not elsewhere classified  History of falling     Problem List Patient Active Problem List   Diagnosis Date Noted   Physical debility 03/21/2021   Dizziness 03/21/2021   Vertigo 03/21/2021   Anemia due to antineoplastic chemotherapy 03/21/2021   Skin changes related to chemotherapy 03/21/2021   Other constipation 03/21/2021   Multiple myeloma not having achieved remission ([place]) 01/27/2021   Hordeolum externum of left  lower eyelid 06/28/2018   CAD (coronary artery disease) 12/09/2011   Dyslipidemia 12/09/2011  Osteopenia 12/09/2011   Hypothyroid 12/09/2011   Asthma 12/09/2011    Everardo All PT, DPT 07/10/21 12:12 PM   [facility] [address] [place], [place], [address] Phone: [phone]   Fax:  [phone]  Name: Doris Wilkerson MRN: [id number] Date of Birth: September 03, 1946

## 2021-07-12 ENCOUNTER — Inpatient Hospital Stay: Payer: Medicare Other

## 2021-07-12 ENCOUNTER — Other Ambulatory Visit: Payer: Self-pay

## 2021-07-12 VITALS — BP 118/62 | HR 74 | Temp 98.4°F | Resp 17 | Wt 149.2 lb

## 2021-07-12 DIAGNOSIS — C9000 Multiple myeloma not having achieved remission: Secondary | ICD-10-CM | POA: Diagnosis not present

## 2021-07-12 DIAGNOSIS — E079 Disorder of thyroid, unspecified: Secondary | ICD-10-CM | POA: Diagnosis not present

## 2021-07-12 DIAGNOSIS — Z5112 Encounter for antineoplastic immunotherapy: Secondary | ICD-10-CM | POA: Diagnosis not present

## 2021-07-12 DIAGNOSIS — J45909 Unspecified asthma, uncomplicated: Secondary | ICD-10-CM | POA: Diagnosis not present

## 2021-07-12 DIAGNOSIS — E785 Hyperlipidemia, unspecified: Secondary | ICD-10-CM | POA: Diagnosis not present

## 2021-07-12 DIAGNOSIS — I10 Essential (primary) hypertension: Secondary | ICD-10-CM | POA: Diagnosis not present

## 2021-07-12 LAB — CMP (CANCER CENTER ONLY)
ALT: 20 U/L (ref 0–44)
AST: 17 U/L (ref 15–41)
Albumin: 3.8 g/dL (ref 3.5–5.0)
Alkaline Phosphatase: 56 U/L (ref 38–126)
Anion gap: 10 (ref 5–15)
BUN: 14 mg/dL (ref 8–23)
CO2: 24 mmol/L (ref 22–32)
Calcium: 9.4 mg/dL (ref 8.9–10.3)
Chloride: 104 mmol/L (ref 98–111)
Creatinine: 0.75 mg/dL (ref 0.44–1.00)
GFR, Estimated: >60 mL/min (ref >60)
Glucose, Bld: 88 mg/dL (ref 70–99)
Potassium: 3.8 mmol/L (ref 3.5–5.1)
Sodium: 138 mmol/L (ref 135–145)
Total Bilirubin: 0.9 mg/dL (ref 0.3–1.2)
Total Protein: 6.1 g/dL — ABNORMAL LOW (ref 6.5–8.1)

## 2021-07-12 LAB — CBC WITH DIFFERENTIAL (CANCER CENTER ONLY)
Abs Immature Granulocytes: 0.02 10*3/uL (ref 0.00–0.07)
Basophils Absolute: 0.1 10*3/uL (ref 0.0–0.1)
Basophils Relative: 1 %
Eosinophils Absolute: 0.5 10*3/uL (ref 0.0–0.5)
Eosinophils Relative: 6 %
HCT: 34 % — ABNORMAL LOW (ref 36.0–46.0)
Hemoglobin: 11.2 g/dL — ABNORMAL LOW (ref 12.0–15.0)
Immature Granulocytes: 0 %
Lymphocytes Relative: 7 %
Lymphs Abs: 0.6 10*3/uL — ABNORMAL LOW (ref 0.7–4.0)
MCH: 30 pg (ref 26.0–34.0)
MCHC: 32.9 g/dL (ref 30.0–36.0)
MCV: 91.2 fL (ref 80.0–100.0)
Monocytes Absolute: 0.9 10*3/uL (ref 0.1–1.0)
Monocytes Relative: 11 %
Neutro Abs: 6.1 10*3/uL (ref 1.7–7.7)
Neutrophils Relative %: 75 %
Platelet Count: 212 10*3/uL (ref 150–400)
RBC: 3.73 MIL/uL — ABNORMAL LOW (ref 3.87–5.11)
RDW: 15.5 % (ref 11.5–15.5)
WBC Count: 8.1 10*3/uL (ref 4.0–10.5)
nRBC: 0 % (ref 0.0–0.2)

## 2021-07-12 LAB — LACTATE DEHYDROGENASE: LDH: 174 U/L (ref 98–192)

## 2021-07-12 MED ORDER — BORTEZOMIB CHEMO SQ INJECTION 3.5 MG (2.5MG/ML)
1.3000 mg/m2 | Freq: Once | INTRAMUSCULAR | Status: AC
  Administered 2021-07-12: 2.25 mg via SUBCUTANEOUS
  Filled 2021-07-12: qty 0.9

## 2021-07-12 NOTE — Patient Instructions (Signed)
[facility]  Discharge Instructions: Thank you for choosing [facility] to provide your oncology and hematology care.   If you have a lab appointment with the Cancer Center, please go directly to the Cancer Center and check in at the registration area.   Wear comfortable clothing and clothing appropriate for easy access to any Portacath or PICC line.   We strive to give you quality time with your provider. You may need to reschedule your appointment if you arrive late (15 or more minutes).  Arriving late affects you and other patients whose appointments are after yours.  Also, if you miss three or more appointments without notifying the office, you may be dismissed from the clinic at the provider's discretion.      For prescription refill requests, have your pharmacy contact our office and allow 72 hours for refills to be completed.    Today you received the following chemotherapy and/or immunotherapy agents Velcade       To help prevent nausea and vomiting after your treatment, we encourage you to take your nausea medication as directed.  BELOW ARE SYMPTOMS THAT SHOULD BE REPORTED IMMEDIATELY: *FEVER GREATER THAN 100.4 F (38 C) OR HIGHER *CHILLS OR SWEATING *NAUSEA AND VOMITING THAT IS NOT CONTROLLED WITH YOUR NAUSEA MEDICATION *UNUSUAL SHORTNESS OF BREATH *UNUSUAL BRUISING OR BLEEDING *URINARY PROBLEMS (pain or burning when urinating, or frequent urination) *BOWEL PROBLEMS (unusual diarrhea, constipation, pain near the anus) TENDERNESS IN MOUTH AND THROAT WITH OR WITHOUT PRESENCE OF ULCERS (sore throat, sores in mouth, or a toothache) UNUSUAL RASH, SWELLING OR PAIN  UNUSUAL VAGINAL DISCHARGE OR ITCHING   Items with * indicate a potential emergency and should be followed up as soon as possible or go to the Emergency Department if any problems should occur.  Please show the CHEMOTHERAPY ALERT CARD or IMMUNOTHERAPY ALERT CARD at check-in to  the Emergency Department and triage nurse.  Should you have questions after your visit or need to cancel or reschedule your appointment, please contact [facility]  Dept: [phone]  and follow the prompts.  Office hours are 8:00 a.m. to 4:30 p.m. Monday - Friday. Please note that voicemails left after 4:00 p.m. may not be returned until the following business day.  We are closed weekends and major holidays. You have access to a nurse at all times for urgent questions. Please call the main number to the clinic Dept: [phone] and follow the prompts.   For any non-urgent questions, you may also contact your provider using MyChart. We now offer e-Visits for anyone 18 and older to request care online for non-urgent symptoms. For details visit [email or web address].   Also download the MyChart app! Go to the app store, search "MyChart", open the app, select [facility], and log in with your MyChart username and password.  Due to Covid, a mask is required upon entering the hospital/clinic. If you do not have a mask, one will be given to you upon arrival. For doctor visits, patients may have 1 support person aged 18 or older with them. For treatment visits, patients cannot have anyone with them due to current Covid guidelines and our immunocompromised population.   

## 2021-07-13 ENCOUNTER — Encounter: Payer: Self-pay | Admitting: Hematology and Oncology

## 2021-07-16 ENCOUNTER — Encounter: Payer: Self-pay | Admitting: *Deleted

## 2021-07-16 ENCOUNTER — Other Ambulatory Visit: Payer: Self-pay | Admitting: Hematology and Oncology

## 2021-07-17 ENCOUNTER — Other Ambulatory Visit: Payer: Self-pay

## 2021-07-17 ENCOUNTER — Ambulatory Visit: Payer: Medicare Other | Admitting: Physical Therapy

## 2021-07-17 DIAGNOSIS — R262 Difficulty in walking, not elsewhere classified: Secondary | ICD-10-CM | POA: Diagnosis not present

## 2021-07-17 DIAGNOSIS — Z9181 History of falling: Secondary | ICD-10-CM | POA: Diagnosis not present

## 2021-07-17 NOTE — Therapy (Signed)
[facility] [address], [place], [place], [address] Phone: [phone]   Fax:  [phone]  Physical Therapy Treatment  Patient Details  Name: Doris Wilkerson MRN: [id number] Date of Birth: 1946-01-14 Referring Provider (PT): Narda Rutherford, MD   Encounter Date: 07/17/2021   PT End of Session - 07/17/21 1013     Visit Number 15    Date for PT Re-Evaluation 07/19/21    Authorization Type Medicare    Authorization Time Period KX at 15 visits    Progress Note Due on Visit 18    PT Start Time 0935    PT Stop Time 1011    PT Time Calculation (min) 36 min    Equipment Utilized During Treatment Gait belt    Activity Tolerance Patient tolerated treatment well;No increased pain    Behavior During Therapy WFL for tasks assessed/performed             Past Medical History:  Diagnosis Date   Asthma    Cataract    GERD (gastroesophageal reflux disease)    Hyperlipidemia    Hypertension    Thyroid disease     Past Surgical History:  Procedure Laterality Date   Cataract Surgery     CORONARY ANGIOPLASTY WITH STENT PLACEMENT     EYE SURGERY     ORTHOPEDIC SURGERY  2012   TONSILLECTOMY  1951    There were no vitals filed for this visit.   Subjective Assessment - 07/17/21 1006     Subjective Has not has bone pain in approx. 2 weeks. Feels ready to D/C at this time.    Pertinent History multiple myeloma (current)    Limitations Sitting;Walking;Standing    How long can you sit comfortably? 20 minutes with extra cushioning    How long can you stand comfortably? unlimited    How long can you walk comfortably? 15-20 minutes    Patient Stated Goals more mobile; decreased falls; get through remainder of chemotherapy (2 more treatmetns)    Currently in Pain? No/denies                [facility] PT Assessment - 07/17/21 0001       Assessment   Medical Diagnosis R53.81 (ICD-10-CM) - Physical debility    Referring  Provider (PT) Narda Rutherford, MD    Onset Date/Surgical Date 12/19/20    Hand Dominance Right    Next MD Visit None    Prior Therapy Yes      Precautions   Precautions Other (comment)    Precaution Comments actively in chemo; bone METS to lumbar spine and Lt pelvis      Restrictions   Weight Bearing Restrictions No      Balance Screen   Has the patient fallen in the past 6 months Yes    How many times? multiple    Has the patient had a decrease in activity level because of a fear of falling?  No    Is the patient reluctant to leave their home because of a fear of falling?  No      Home Social worker Private residence    Living Arrangements Spouse/significant other      Prior Function   Level of Independence Independent      Cognition   Overall Cognitive Status Within Functional Limits for tasks assessed      Observation/Other Assessments   Focus on Therapeutic Outcomes (FOTO)  55 (goal 60)  Dynamic Gait Index   Level Surface Normal    Change in Gait Speed Normal    Gait with Horizontal Head Turns Moderate Impairment    Gait with Vertical Head Turns Normal    Gait and Pivot Turn Normal    Step Over Obstacle Normal    Step Around Obstacles Normal    Steps Normal    Total Score 22                           [facility] Adult PT Treatment/Exercise - 07/17/21 0001       Neuro Re-ed    Neuro Re-ed Details  staggered stance weight shifts on black foam 2 x 60s trial; normal stance lateral weight shifts on black foam, 2 x 60s  trial; normal stance forward/back weigh shift black foam x60s; hurdle step over: one at a time, reciprocal step over, lateral step over, weaving x2 trips each      Knee/Hip Exercises: Standing   Lateral Step Up Right;Left;10 reps;Hand Hold: 1;Step Height: 6"    Forward Step Up Right;Left;1 set;10 reps;Hand Hold: 1;Step Height: 6"                      PT Short Term Goals - 07/17/21 1004       PT SHORT  TERM GOAL #1   Title Patient will be independent with HEP for continued progression at home.    Time 4    Period Weeks    Status Achieved    Target Date 04/23/21      PT SHORT TERM GOAL #2   Title Patient will complete 6 minute walk test without use of AD with minimal gait impairments and no reports of increased bone pain.    Time 4    Period Weeks    Status Achieved    Target Date 04/23/21               PT Long Term Goals - 07/17/21 1005       PT LONG TERM GOAL #2   Title Patient will be independent with advanced HEP for long term management of symptoms post D/C.    Baseline doing exercises as able- side effects from chemo    Time 8    Period Weeks    Status Achieved      PT LONG TERM GOAL #3   Title Patient will complete five time sit to stand in </= 13s to indicate decreased fall risk.    Baseline 18s    Time 8    Period Weeks    Status Achieved      PT LONG TERM GOAL #4   Title Patient will score >/= 22/24 on DGI to indicate decreased fall risk.    Baseline 20    Time 8    Period Weeks    Status Achieved                   Plan - 07/17/21 1006     Clinical Impression Statement Patient is a 75 y/o female referred due to physical debility. She has met all STG and LTG except FOTO score however, FOTO score significantly improved indicating improved overall function. DGI score improved to 22/24 indicating decreased fall risk. Patient subjectively reports improved activity tolerance as she has ambulated numerous shopping centers and event spaces over the course of this therapy episode. No further skilled PT needs. Recommend D/C to HEP.  Personal Factors and Comorbidities Comorbidity 1    Comorbidities Cancer (current)    Examination-Activity Limitations Sit;Locomotion Level;Transfers    Examination-Participation Restrictions Community Activity;Cleaning    Rehab Potential Good    PT Frequency 1x / week    PT Duration 8 weeks    PT  Treatment/Interventions ADLs/Self Care Home Management;Aquatic Therapy;Gait training;Functional mobility training;Therapeutic activities;Therapeutic exercise;Balance training;Neuromuscular re-education;Patient/family education    PT Next Visit Plan D/C to HEP    PT Home Exercise Plan Access Code [email or web address]    Consulted and Agree with Plan of Care Patient             Patient will benefit from skilled therapeutic intervention in order to improve the following deficits and impairments:  Decreased activity tolerance, Abnormal gait, Difficulty walking, Pain  Visit Diagnosis: Difficulty in walking, not elsewhere classified  History of falling     Problem List Patient Active Problem List   Diagnosis Date Noted   Physical debility 03/21/2021   Dizziness 03/21/2021   Vertigo 03/21/2021   Anemia due to antineoplastic chemotherapy 03/21/2021   Skin changes related to chemotherapy 03/21/2021   Other constipation 03/21/2021   Multiple myeloma not having achieved remission ([place]) 01/27/2021   Hordeolum externum of left lower eyelid 06/28/2018   CAD (coronary artery disease) 12/09/2011   Dyslipidemia 12/09/2011   Osteopenia 12/09/2011   Hypothyroid 12/09/2011   Asthma 12/09/2011    PHYSICAL THERAPY DISCHARGE SUMMARY  Visits from Start of Care: 14  Current functional level related to goals / functional outcomes: See above   Remaining deficits: See above   Education / Equipment: See above   Patient agrees to discharge. Patient goals were partially met. Patient is being discharged due to being pleased with the current functional level.  Everardo All PT, DPT 07/17/21 10:17 AM    [facility] [address], [place], [place], [address] Phone: [phone]   Fax:  [phone]  Name: Doris Wilkerson MRN: [id number] Date of Birth: 1946-04-03

## 2021-07-19 ENCOUNTER — Other Ambulatory Visit: Payer: Self-pay

## 2021-07-19 ENCOUNTER — Inpatient Hospital Stay: Payer: Medicare Other

## 2021-07-19 ENCOUNTER — Inpatient Hospital Stay (HOSPITAL_BASED_OUTPATIENT_CLINIC_OR_DEPARTMENT_OTHER): Payer: Medicare Other | Admitting: Hematology and Oncology

## 2021-07-19 ENCOUNTER — Ambulatory Visit: Payer: Medicare Other

## 2021-07-19 VITALS — BP 136/71 | HR 68 | Temp 98.6°F | Resp 18 | Ht 66.0 in | Wt 149.9 lb

## 2021-07-19 DIAGNOSIS — C9000 Multiple myeloma not having achieved remission: Secondary | ICD-10-CM

## 2021-07-19 DIAGNOSIS — M899 Disorder of bone, unspecified: Secondary | ICD-10-CM | POA: Diagnosis not present

## 2021-07-19 DIAGNOSIS — J45909 Unspecified asthma, uncomplicated: Secondary | ICD-10-CM | POA: Diagnosis not present

## 2021-07-19 DIAGNOSIS — E079 Disorder of thyroid, unspecified: Secondary | ICD-10-CM | POA: Diagnosis not present

## 2021-07-19 DIAGNOSIS — D6481 Anemia due to antineoplastic chemotherapy: Secondary | ICD-10-CM

## 2021-07-19 DIAGNOSIS — T451X5A Adverse effect of antineoplastic and immunosuppressive drugs, initial encounter: Secondary | ICD-10-CM | POA: Diagnosis not present

## 2021-07-19 DIAGNOSIS — Z5112 Encounter for antineoplastic immunotherapy: Secondary | ICD-10-CM | POA: Diagnosis not present

## 2021-07-19 DIAGNOSIS — E785 Hyperlipidemia, unspecified: Secondary | ICD-10-CM | POA: Diagnosis not present

## 2021-07-19 DIAGNOSIS — I10 Essential (primary) hypertension: Secondary | ICD-10-CM | POA: Diagnosis not present

## 2021-07-19 LAB — CMP (CANCER CENTER ONLY)
ALT: 22 U/L (ref 0–44)
AST: 18 U/L (ref 15–41)
Albumin: 3.8 g/dL (ref 3.5–5.0)
Alkaline Phosphatase: 61 U/L (ref 38–126)
Anion gap: 6 (ref 5–15)
BUN: 16 mg/dL (ref 8–23)
CO2: 25 mmol/L (ref 22–32)
Calcium: 9.6 mg/dL (ref 8.9–10.3)
Chloride: 108 mmol/L (ref 98–111)
Creatinine: 0.79 mg/dL (ref 0.44–1.00)
GFR, Estimated: >60 mL/min (ref >60)
Glucose, Bld: 97 mg/dL (ref 70–99)
Potassium: 3.8 mmol/L (ref 3.5–5.1)
Sodium: 139 mmol/L (ref 135–145)
Total Bilirubin: 0.6 mg/dL (ref 0.3–1.2)
Total Protein: 6.1 g/dL — ABNORMAL LOW (ref 6.5–8.1)

## 2021-07-19 LAB — CBC WITH DIFFERENTIAL (CANCER CENTER ONLY)
Abs Immature Granulocytes: 0.02 10*3/uL (ref 0.00–0.07)
Basophils Absolute: 0.1 10*3/uL (ref 0.0–0.1)
Basophils Relative: 1 %
Eosinophils Absolute: 0.2 10*3/uL (ref 0.0–0.5)
Eosinophils Relative: 3 %
HCT: 32.9 % — ABNORMAL LOW (ref 36.0–46.0)
Hemoglobin: 11 g/dL — ABNORMAL LOW (ref 12.0–15.0)
Immature Granulocytes: 0 %
Lymphocytes Relative: 13 %
Lymphs Abs: 0.9 10*3/uL (ref 0.7–4.0)
MCH: 30.4 pg (ref 26.0–34.0)
MCHC: 33.4 g/dL (ref 30.0–36.0)
MCV: 90.9 fL (ref 80.0–100.0)
Monocytes Absolute: 1.3 10*3/uL — ABNORMAL HIGH (ref 0.1–1.0)
Monocytes Relative: 18 %
Neutro Abs: 4.6 10*3/uL (ref 1.7–7.7)
Neutrophils Relative %: 65 %
Platelet Count: 222 10*3/uL (ref 150–400)
RBC: 3.62 MIL/uL — ABNORMAL LOW (ref 3.87–5.11)
RDW: 15.9 % — ABNORMAL HIGH (ref 11.5–15.5)
WBC Count: 7.1 10*3/uL (ref 4.0–10.5)
nRBC: 0 % (ref 0.0–0.2)

## 2021-07-19 LAB — LACTATE DEHYDROGENASE: LDH: 202 U/L — ABNORMAL HIGH (ref 98–192)

## 2021-07-19 NOTE — Progress Notes (Signed)
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 Concerning for multiple myeloma vs metastatic disease 12/31/2020: establish care with Dr. Lorenso Wilkerson. UPEP showed marked Bence Jones protein, findings concerning for multiple myleoma 01/21/2021: bone marrow biopsy confirms multiple myeloma with atypical plasma cells representing 28% of all cells in the aspirate  02/01/2021: Cycle 1 Day 1 of VRd chemotherapy 02/22/2021: Cycle 2 Day 1 of VRd chemotherapy 03/15/2021: Cycle 3 Day 1 of VRd chemotherapy 04/05/2021: Cycle 4 Day 1 of VRd chemotherapy 04/26/2021: Cycle 5 Day 1 of VRd chemotherapy 05/17/2021: Cycle 6 Day 1 of VRd chemotherapy 06/07/2021:  Cycle 7 Day 1 of VRd chemotherapy 06/28/2021: Cycle 8 Day 1 of VRd chemotherapy 07/19/2021: start maintenance revlimid 23m PO daily   Interval History:  [name]75y.o. female with medical history significant for lambda light chain multiple myeloma who presents for a follow up visit. The patient's last visit was on 06/28/2021. In the interim since the last visit she completed VRd chemotherapy.  She will be transitioning to maintenance Revlimid at this time.  On exam today Mrs. Doris Wilkerson reports she has been well overall in the interim since her last visit.  She has been having some fatigue loose stools last week, but those have resolved today.  She notes that she is not having any issues with bone pain at the moment but that that does occasionally flare with physical therapy.  Her appetite has been good and she is eager to begin maintenance therapy at this time.  She  denies any fevers, chills, sweats, nausea, vomiting or diarrhea. A full 10 point ROS is listed below.   MEDICAL HISTORY:  Past Medical History:  Diagnosis Date   Asthma    Cataract    GERD (gastroesophageal reflux disease)    Hyperlipidemia    Hypertension    Thyroid disease     SURGICAL HISTORY: Past Surgical History:  Procedure Laterality Date   Cataract Surgery     CORONARY ANGIOPLASTY WITH STENT PLACEMENT     EYE SURGERY     ORTHOPEDIC SURGERY  2012   TONSILLECTOMY  1951    SOCIAL HISTORY: Social History   Socioeconomic History   Marital status: Married    Spouse name: Not on file   Number of children: 0   Years of education: Not on file   Highest education level: Not on file  Occupational History   Occupation: unemployed  Tobacco Use   Smoking status: Never   Smokeless tobacco: Never  Vaping Use   Vaping Use: Never used  Substance and Sexual Activity   Alcohol use: Yes    Alcohol/week: 1.0 standard drink    Types: 1 Glasses of wine per week    Comment: occassionally   Drug use: No   Sexual activity: Not Currently  Other Topics Concern   Not on file  Social History Narrative   Married. Education: college. Pt does exercise-   Social Determinants of Health   Financial Resource Strain: Not on file  Food Insecurity: Not on file  Transportation Needs: Not on file  Physical Activity: Not on file  Stress: Not on file  Social Connections:  Not on file  Intimate Partner Violence: Not on file    FAMILY HISTORY: Family History  Problem Relation Age of Onset   Cancer Father    Heart disease Brother    Dementia Brother    Leukemia Brother    Breast cancer Neg Hx    Colon cancer Neg Hx    Esophageal cancer Neg Hx    Pancreatic cancer Neg Hx    Rectal cancer Neg Hx    Stomach cancer Neg Hx     ALLERGIES:  is allergic to poison ivy extract, plavix [clopidogrel bisulfate], and other.  MEDICATIONS:  Current Outpatient Medications  Medication Sig  Dispense Refill   acetaminophen (TYLENOL 8 HOUR ARTHRITIS PAIN) 650 MG CR tablet Take 1 tablet (650 mg total) by mouth every 8 (eight) hours as needed for pain.     acyclovir (ZOVIRAX) 400 MG tablet Take 1 tablet (400 mg total) by mouth 2 (two) times daily. 60 tablet 5   aspirin 81 MG tablet Take 81 mg by mouth daily.      atorvastatin (LIPITOR) 80 MG tablet TAKE 1 TABLET(80 MG) BY MOUTH DAILY AT 6 PM 90 tablet 1   benazepril (LOTENSIN) 10 MG tablet TAKE 1 TABLET(10 MG) BY MOUTH DAILY 90 tablet 1   calcium carbonate (OS-CAL) 1250 (500 Ca) MG chewable tablet Chew 1 tablet by mouth daily.     cholecalciferol (VITAMIN D3) 25 MCG (1000 UNIT) tablet Take 2,000 Units by mouth daily.     dexamethasone (DECADRON) 4 MG tablet TAKE 10 TABLETS BY MOUTH ONCE A WEEK 40 tablet 3   DM-APAP-CPM (CORICIDIN HBP PO) Take by mouth.      famotidine-calcium carbonate-magnesium hydroxide (PEPCID COMPLETE) 10-800-165 MG chewable tablet Chew 1 tablet by mouth daily.     lenalidomide (REVLIMID) 10 MG capsule Take 1 capsule (10 mg total) by mouth daily. [employer] Auth # [id number]     Date Obtained 07/04/21  Take 1 tablet daily for 21 days. None for 7 days 21 capsule 0   levothyroxine (SYNTHROID) 25 MCG tablet TAKE 1 TABLET(25 MCG) BY MOUTH DAILY BEFORE BREAKFAST 90 tablet 0   ondansetron (ZOFRAN) 8 MG tablet Take 1 tablet (8 mg total) by mouth every 8 (eight) hours as needed for nausea or vomiting. 30 tablet 0   prochlorperazine (COMPAZINE) 10 MG tablet Take 1 tablet (10 mg total) by mouth every 6 (six) hours as needed for nausea or vomiting. 30 tablet 0   simethicone (MYLICON) 553 MG chewable tablet Chew 125 mg by mouth every 6 (six) hours as needed for flatulence.     No current facility-administered medications for this visit.    REVIEW OF SYSTEMS:   Constitutional: ( - ) fevers, ( - )  chills , ( - ) night sweats Eyes: ( - ) blurriness of vision, ( - ) double vision, ( - ) watery eyes Ears, nose, mouth, throat, and face:  ( - ) mucositis, ( - ) sore throat Respiratory: ( - ) cough, ( - ) dyspnea, ( - ) wheezes Cardiovascular: ( - ) palpitation, ( - ) chest discomfort, ( - ) lower extremity swelling Gastrointestinal:  ( - ) nausea, ( - ) heartburn, ( - ) change in bowel habits Skin: ( - ) abnormal skin rashes Lymphatics: ( - ) new lymphadenopathy, ( - ) easy bruising Neurological: ( - ) numbness, ( - ) tingling, ( - ) new weaknesses Behavioral/Psych: ( - ) mood change, ( - ) new changes  All other systems were reviewed with the patient and are negative.  PHYSICAL EXAMINATION: ECOG PERFORMANCE STATUS: 1 - Symptomatic but completely ambulatory  Vitals:   07/19/21 1021  BP: 136/71  Pulse: 68  Resp: 18  Temp: 98.6 F (37 C)  SpO2: 100%    Filed Weights   07/19/21 1021  Weight: 149 lb 14.4 oz (68 kg)    GENERAL: well appearing elderly Caucasian female in NAD  SKIN: skin color, texture, turgor are normal, no rashes or significant lesions EYES: conjunctiva are pink and non-injected, sclera clear LUNGS: clear to auscultation and percussion with normal breathing effort HEART: regular rate & rhythm and no murmurs and trace lower extremity edema Musculoskeletal: no cyanosis of digits and no clubbing  PSYCH: alert & oriented x 3, fluent speech NEURO: no focal motor/sensory deficits  LABORATORY DATA:  I have reviewed the data as listed CBC Latest Ref Rng & Units 07/19/2021 07/12/2021 07/05/2021  WBC 4.0 - 10.5 K/uL 7.1 8.1 7.5  Hemoglobin 12.0 - 15.0 g/dL 11.0(L) 11.2(L) 11.7(L)  Hematocrit 36.0 - 46.0 % 32.9(L) 34.0(L) 35.4(L)  Platelets 150 - 400 K/uL 222 212 221    CMP Latest Ref Rng & Units 07/19/2021 07/12/2021 07/05/2021  Glucose 70 - 99 mg/dL 97 88 80  BUN 8 - 23 mg/dL '16 14 10  ' Creatinine 0.44 - 1.00 mg/dL 0.79 0.75 0.77  Sodium 135 - 145 mmol/L 139 138 139  Potassium 3.5 - 5.1 mmol/L 3.8 3.8 3.7  Chloride 98 - 111 mmol/L 108 104 104  CO2 22 - 32 mmol/L '25 24 26  ' Calcium 8.9 - 10.3 mg/dL 9.6  9.4 9.7  Total Protein 6.5 - 8.1 g/dL 6.1(L) 6.1(L) 6.4(L)  Total Bilirubin 0.3 - 1.2 mg/dL 0.6 0.9 0.7  Alkaline Phos 38 - 126 U/L 61 56 62  AST 15 - 41 U/L '18 17 18  ' ALT 0 - 44 U/L '22 20 24    ' Lab Results  Component Value Date   MPROTEIN Not Observed 06/28/2021   MPROTEIN Not Observed 05/24/2021   MPROTEIN Not Observed 05/03/2021   Lab Results  Component Value Date   KPAFRELGTCHN 14.5 06/28/2021   KPAFRELGTCHN 16.1 05/24/2021   KPAFRELGTCHN 19.7 (H) 05/03/2021   LAMBDASER 25.8 06/28/2021   LAMBDASER 33.8 (H) 05/24/2021   LAMBDASER 51.7 (H) 05/03/2021   KAPLAMBRATIO 7.10 07/05/2021   KAPLAMBRATIO 0.56 06/28/2021   KAPLAMBRATIO 5.93 05/31/2021    RADIOGRAPHIC STUDIES: No results found.  ASSESSMENT & PLAN Wendolyn Raso 75 y.o. female with medical history significant for lambda light chain multiple myeloma who presents for a follow up visit.   After review the labs, review the records, schedule the patient the findings most consistent with a lambda light chain multiple myeloma.  This is getting confirmed with a bone marrow biopsy showing an abnormal plasma cell population of 28% and M protein/Bence-Jones proteins in the urine.  Given these findings the patient now carries a diagnosis of lambda light chain multiple myeloma.  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  This consists of bortezomib 1.3 mg per metered squared q week, lenalidomide 25 mg p.o. x14 days of 21-day cycle, and dexamethasone 40 mg p.o. q. weekly.  This will be continued until he reached a VGPR/ complete 8 cycles of triplet therapy, at which time we can consider transition over to maintenance Revlimid 10 mg p.o. daily 21 of 28 days per cycle.  Ms. Wince is not a candidate for bone marrow  transplant based on her advanced age.  She transition to maintenance Revlimid on 07/19/2021.  # Lambda Light Chain Multiple Myeloma --diagnosis confirmed with bone marrow biopsy and urine  protein analysis.   --patient has good functional status and would be an excellent candidate for VRd chemotherapy. I do not believe she would be a bone marrow transplant candidate based on her age.  --assure monthly restaging labs with SPEP, UPEP, and SFLC --patient declines port placement at this time --Cycle 1 Day 1 of therapy started on 02/01/2021.  --she completed Cycle 8 Day 1 of Vrd, started maintenance Revlimid on 07/19/2021. --excellent response noted within 1st cycle, normalization of urine free light chains and undetectable M protein.  Plan: -- Continue maintenance Revlimid 10 mg p.o. daily with 21 days on and 7 days off --Monthly restaging labs with serum free light chains, UPEP, and SPEP --Return to clinic every 4 weeks  #Supportive Care --chemotherapy education complete  --patient declines port placement at this time --zofran 36m q8H PRN and compazine 119mPO q6H for nausea --acyclovir can be d/c --zometa therapy performed on 06/28/2021. Repeat q 3 months.  -- no pain medication required at this time.   No orders of the defined types were placed in this encounter.  All questions were answered. The patient knows to call the clinic with any problems, questions or concerns.  A total of more than 30 minutes were spent on this encounter and over half of that time was spent on counseling and coordination of care as outlined above.   JoLedell PeoplesMD Department of Hematology/Oncology CoFederal Damt WeMorris Hospital & Healthcare Centershone: [phone]: [phone]: [email or web address]  07/23/2021 1:46 PM   Literature Support:  1) SuBennett Scrapeortezomib, lenalidomide, and dexamethasone in transplant-eligible newly diagnosed multiple myeloma patients: a multicenter retrospective comparative analysis. Int J Hinton Dyer2020 Jan;111(1):103-111. doi:  10.1007/s12185-019-02764-1.  -- Overall response, very good partial response, and complete response rates after VRD were 96.4%, 45.5%, and 20.0%, respectively (median follow-up period, 17.7 months). The 1-year progression-free survival (PFS) and overall survival rates were 95.8% and 98.2%, respectively. The response rate and PFS were similar between the groups, regardless of cytogenetic risk and age.  2) JaCasandra DoffingDa84B South StreetE, Pawlyn C,Loletha GrayerCaNacoStSioux RapidsCoEl ParaisoHockaday A, Jones JR, Kishore B, Garg M, Williams CD, Karunanithi K,Delaine LameW, CoLaban EmperorH, KaTebbettsDrayson MT, OwRobinette HainesM, MoMyra RudeUKVenezuelaCRI [facility]-oncology Clinical Studies Group. Lenalidomide maintenance versus observation for patients with newly diagnosed multiple myeloma (Myeloma XI): a multicentre, open-label, randomised, phase 3 trial. Lancet Oncol. 2019 Jan;20(1):57-73. doi: 10.1016/S1470-2045(18)30687-9. Epub 2018 Dec 14.   --Maintenance therapy with lenalidomide significantly improved progression-free survival in patients with newly diagnosed multiple myeloma compared with observation   --After a median follow-up of 31 months (IQR 18-50), median progression-free survival was 39 months (95% CI 36-42) with lenalidomide and 20 months (18-22) with observation (hazard ratio [HR] 046 [95% CI 041-053]; p<00001), and 3-year overall survival was 786% (95% Cl 756-816) in the lenalidomide group and 758% (7(242-353in the observation group (HR 087 [95% CI 073-105]; p=015). Progression-free survival was improved with lenalidomide compared with observation across all prespecified subgroups.

## 2021-07-23 ENCOUNTER — Encounter: Payer: Self-pay | Admitting: Hematology and Oncology

## 2021-07-23 ENCOUNTER — Other Ambulatory Visit: Payer: Self-pay | Admitting: Hematology and Oncology

## 2021-07-28 ENCOUNTER — Encounter: Payer: Self-pay | Admitting: Hematology and Oncology

## 2021-08-02 ENCOUNTER — Other Ambulatory Visit: Payer: Self-pay | Admitting: *Deleted

## 2021-08-02 MED ORDER — LENALIDOMIDE 10 MG PO CAPS: 10.0000 mg | ORAL_CAPSULE | Freq: Every day | ORAL | 0 refills | Status: DC

## 2021-08-08 DIAGNOSIS — Z23 Encounter for immunization: Secondary | ICD-10-CM | POA: Diagnosis not present

## 2021-08-16 ENCOUNTER — Other Ambulatory Visit: Payer: Self-pay

## 2021-08-16 ENCOUNTER — Inpatient Hospital Stay (HOSPITAL_BASED_OUTPATIENT_CLINIC_OR_DEPARTMENT_OTHER): Payer: Medicare Other | Admitting: Hematology and Oncology

## 2021-08-16 ENCOUNTER — Inpatient Hospital Stay: Payer: Medicare Other | Attending: Hematology and Oncology

## 2021-08-16 ENCOUNTER — Encounter: Payer: Self-pay | Admitting: Hematology and Oncology

## 2021-08-16 VITALS — BP 145/74 | HR 65 | Temp 98.1°F | Resp 17 | Ht 66.0 in | Wt 146.6 lb

## 2021-08-16 DIAGNOSIS — J45909 Unspecified asthma, uncomplicated: Secondary | ICD-10-CM | POA: Insufficient documentation

## 2021-08-16 DIAGNOSIS — Z79899 Other long term (current) drug therapy: Secondary | ICD-10-CM | POA: Diagnosis not present

## 2021-08-16 DIAGNOSIS — I10 Essential (primary) hypertension: Secondary | ICD-10-CM | POA: Insufficient documentation

## 2021-08-16 DIAGNOSIS — C9001 Multiple myeloma in remission: Secondary | ICD-10-CM

## 2021-08-16 DIAGNOSIS — M899 Disorder of bone, unspecified: Secondary | ICD-10-CM | POA: Diagnosis not present

## 2021-08-16 DIAGNOSIS — Z7952 Long term (current) use of systemic steroids: Secondary | ICD-10-CM | POA: Insufficient documentation

## 2021-08-16 DIAGNOSIS — E079 Disorder of thyroid, unspecified: Secondary | ICD-10-CM | POA: Insufficient documentation

## 2021-08-16 DIAGNOSIS — C9000 Multiple myeloma not having achieved remission: Secondary | ICD-10-CM | POA: Insufficient documentation

## 2021-08-16 DIAGNOSIS — E785 Hyperlipidemia, unspecified: Secondary | ICD-10-CM | POA: Insufficient documentation

## 2021-08-16 DIAGNOSIS — Z7982 Long term (current) use of aspirin: Secondary | ICD-10-CM | POA: Diagnosis not present

## 2021-08-16 LAB — CMP (CANCER CENTER ONLY)
ALT: 21 U/L (ref 0–44)
AST: 24 U/L (ref 15–41)
Albumin: 4.6 g/dL (ref 3.5–5.0)
Alkaline Phosphatase: 58 U/L (ref 38–126)
Anion gap: 10 (ref 5–15)
BUN: 14 mg/dL (ref 8–23)
CO2: 24 mmol/L (ref 22–32)
Calcium: 10 mg/dL (ref 8.9–10.3)
Chloride: 106 mmol/L (ref 98–111)
Creatinine: 0.81 mg/dL (ref 0.44–1.00)
GFR, Estimated: >60 mL/min (ref >60)
Glucose, Bld: 88 mg/dL (ref 70–99)
Potassium: 3.9 mmol/L (ref 3.5–5.1)
Sodium: 140 mmol/L (ref 135–145)
Total Bilirubin: 0.9 mg/dL (ref 0.3–1.2)
Total Protein: 7.1 g/dL (ref 6.5–8.1)

## 2021-08-16 LAB — CBC WITH DIFFERENTIAL (CANCER CENTER ONLY)
Abs Immature Granulocytes: 0 10*3/uL (ref 0.00–0.07)
Basophils Absolute: 0.1 10*3/uL (ref 0.0–0.1)
Basophils Relative: 2 %
Eosinophils Absolute: 0.2 10*3/uL (ref 0.0–0.5)
Eosinophils Relative: 4 %
HCT: 38.7 % (ref 36.0–46.0)
Hemoglobin: 12.7 g/dL (ref 12.0–15.0)
Immature Granulocytes: 0 %
Lymphocytes Relative: 25 %
Lymphs Abs: 1.1 10*3/uL (ref 0.7–4.0)
MCH: 29.5 pg (ref 26.0–34.0)
MCHC: 32.8 g/dL (ref 30.0–36.0)
MCV: 90 fL (ref 80.0–100.0)
Monocytes Absolute: 0.6 10*3/uL (ref 0.1–1.0)
Monocytes Relative: 14 %
Neutro Abs: 2.4 10*3/uL (ref 1.7–7.7)
Neutrophils Relative %: 55 %
Platelet Count: 280 10*3/uL (ref 150–400)
RBC: 4.3 MIL/uL (ref 3.87–5.11)
RDW: 15.3 % (ref 11.5–15.5)
WBC Count: 4.3 10*3/uL (ref 4.0–10.5)
nRBC: 0 % (ref 0.0–0.2)

## 2021-08-16 LAB — LACTATE DEHYDROGENASE: LDH: 220 U/L — ABNORMAL HIGH (ref 98–192)

## 2021-08-16 NOTE — Progress Notes (Signed)
[employer] [phone]   [phone]  PROGRESS NOTE  Patient Care Team: [name], MD as PCP - General (Family Medicine) [name], MD as Consulting Physician (Cardiology) [name], MD (Ophthalmology)  Hematological/Oncological History # Lambda Light Chain Multiple Myeloma 12/26/2020: MRI of pelvis showed lytic lesions on L4, L5, the sacrum, with pathologic fracture of left inferior pubic ramus. Concerning for multiple myeloma vs metastatic disease 12/31/2020: establish care with Dr. Lorenso Courier. UPEP showed marked Bence Jones protein, findings concerning for multiple myleoma 01/21/2021: bone marrow biopsy confirms multiple myeloma with atypical plasma cells representing 28% of all cells in the aspirate  02/01/2021: Cycle 1 Day 1 of VRd chemotherapy 02/22/2021: Cycle 2 Day 1 of VRd chemotherapy 03/15/2021: Cycle 3 Day 1 of VRd chemotherapy 04/05/2021: Cycle 4 Day 1 of VRd chemotherapy 04/26/2021: Cycle 5 Day 1 of VRd chemotherapy 05/17/2021: Cycle 6 Day 1 of VRd chemotherapy 06/07/2021:  Cycle 7 Day 1 of VRd chemotherapy 06/28/2021: Cycle 8 Day 1 of VRd chemotherapy 07/19/2021: start maintenance revlimid 79m PO daily   Interval History:  Doris Spampinato75y.o. female with medical history significant for lambda light chain multiple myeloma who presents for a follow up visit. The patient's last visit was on 07/19/2021. In the interim since she hs continued maintenance Revlimid.  On exam today Doris Wilkerson reports she has been well in the interim since her last visit.  She is currently starting her second month of maintenance Revlimid.  She notes that on day 15 of her last cycle she did have some red bumps that developed on her face and resolved with antihistamine therapy.  She notes that she has not been having any issues with nausea, vomiting, or diarrhea.  She is noticing some new soreness or pain in her left back ribs.  She also does have chronic issues with  her shoulder but notes that her pelvic pain is improved.  Otherwise she has been at her baseline level of health.  She does endorse having low levels of energy but otherwise feels good.  She denies any fevers, chills, sweats, nausea, vomiting or diarrhea. A full 10 point ROS is listed below.   MEDICAL HISTORY:  Past Medical History:  Diagnosis Date   Asthma    Cataract    GERD (gastroesophageal reflux disease)    Hyperlipidemia    Hypertension    Thyroid disease     SURGICAL HISTORY: Past Surgical History:  Procedure Laterality Date   Cataract Surgery     CORONARY ANGIOPLASTY WITH STENT PLACEMENT     EYE SURGERY     ORTHOPEDIC SURGERY  2012   TONSILLECTOMY  1951    SOCIAL HISTORY: Social History   Socioeconomic History   Marital status: Married    Spouse name: Not on file   Number of children: 0   Years of education: Not on file   Highest education level: Not on file  Occupational History   Occupation: unemployed  Tobacco Use   Smoking status: Never   Smokeless tobacco: Never  Vaping Use   Vaping Use: Never used  Substance and Sexual Activity   Alcohol use: Yes    Alcohol/week: 1.0 standard drink    Types: 1 Glasses of wine per week    Comment: occassionally   Drug use: No   Sexual activity: Not Currently  Other Topics Concern   Not on file  Social History Narrative   Married. Education: college. Pt does exercise-  Social Determinants of Health   Financial Resource Strain: Not on file  Food Insecurity: Not on file  Transportation Needs: Not on file  Physical Activity: Not on file  Stress: Not on file  Social Connections: Not on file  Intimate Partner Violence: Not on file    FAMILY HISTORY: Family History  Problem Relation Age of Onset   Cancer Father    Heart disease Brother    Dementia Brother    Leukemia Brother    Breast cancer Neg Hx    Colon cancer Neg Hx    Esophageal cancer Neg Hx    Pancreatic cancer Neg Hx    Rectal cancer Neg Hx     Stomach cancer Neg Hx     ALLERGIES:  is allergic to poison ivy extract, plavix [clopidogrel bisulfate], and other.  MEDICATIONS:  Current Outpatient Medications  Medication Sig Dispense Refill   acetaminophen (TYLENOL 8 HOUR ARTHRITIS PAIN) 650 MG CR tablet Take 1 tablet (650 mg total) by mouth every 8 (eight) hours as needed for pain.     acyclovir (ZOVIRAX) 400 MG tablet Take 1 tablet (400 mg total) by mouth 2 (two) times daily. 60 tablet 5   aspirin 81 MG tablet Take 81 mg by mouth daily.      atorvastatin (LIPITOR) 80 MG tablet TAKE 1 TABLET(80 MG) BY MOUTH DAILY AT 6 PM 90 tablet 1   benazepril (LOTENSIN) 10 MG tablet TAKE 1 TABLET(10 MG) BY MOUTH DAILY 90 tablet 1   calcium carbonate (OS-CAL) 1250 (500 Ca) MG chewable tablet Chew 1 tablet by mouth daily.     cholecalciferol (VITAMIN D3) 25 MCG (1000 UNIT) tablet Take 2,000 Units by mouth daily.     dexamethasone (DECADRON) 4 MG tablet TAKE 10 TABLETS BY MOUTH ONCE A WEEK 40 tablet 3   DM-APAP-CPM (CORICIDIN HBP PO) Take by mouth.      famotidine-calcium carbonate-magnesium hydroxide (PEPCID COMPLETE) 10-800-165 MG chewable tablet Chew 1 tablet by mouth daily.     lenalidomide (REVLIMID) 10 MG capsule Take 1 capsule (10 mg total) by mouth daily. Fanny Dance # [id number]    Date Obtained 08/02/21  Take 1 tablet daily for 21 days. None for 7 days 21 capsule 0   levothyroxine (SYNTHROID) 25 MCG tablet TAKE 1 TABLET(25 MCG) BY MOUTH DAILY BEFORE BREAKFAST 90 tablet 0   ondansetron (ZOFRAN) 8 MG tablet Take 1 tablet (8 mg total) by mouth every 8 (eight) hours as needed for nausea or vomiting. 30 tablet 0   prochlorperazine (COMPAZINE) 10 MG tablet Take 1 tablet (10 mg total) by mouth every 6 (six) hours as needed for nausea or vomiting. 30 tablet 0   simethicone (MYLICON) 017 MG chewable tablet Chew 125 mg by mouth every 6 (six) hours as needed for flatulence.     No current facility-administered medications for this visit.    REVIEW OF  SYSTEMS:   Constitutional: ( - ) fevers, ( - )  chills , ( - ) night sweats Eyes: ( - ) blurriness of vision, ( - ) double vision, ( - ) watery eyes Ears, nose, mouth, throat, and face: ( - ) mucositis, ( - ) sore throat Respiratory: ( - ) cough, ( - ) dyspnea, ( - ) wheezes Cardiovascular: ( - ) palpitation, ( - ) chest discomfort, ( - ) lower extremity swelling Gastrointestinal:  ( - ) nausea, ( - ) heartburn, ( - ) change in bowel habits Skin: ( - ) abnormal skin rashes  Lymphatics: ( - ) new lymphadenopathy, ( - ) easy bruising Neurological: ( - ) numbness, ( - ) tingling, ( - ) new weaknesses Behavioral/Psych: ( - ) mood change, ( - ) new changes  All other systems were reviewed with the patient and are negative.  PHYSICAL EXAMINATION: ECOG PERFORMANCE STATUS: 1 - Symptomatic but completely ambulatory  Vitals:   08/16/21 1052  BP: (!) 145/74  Pulse: 65  Resp: 17  Temp: 98.1 F (36.7 C)  SpO2: 100%    Filed Weights   08/16/21 1052  Weight: 146 lb 9.6 oz (66.5 kg)    GENERAL: well appearing elderly Caucasian female in NAD  SKIN: skin color, texture, turgor are normal, no rashes or significant lesions EYES: conjunctiva are pink and non-injected, sclera clear LUNGS: clear to auscultation and percussion with normal breathing effort HEART: regular rate & rhythm and no murmurs and trace lower extremity edema Musculoskeletal: no cyanosis of digits and no clubbing  PSYCH: alert & oriented x 3, fluent speech NEURO: no focal motor/sensory deficits  LABORATORY DATA:  I have reviewed the data as listed CBC Latest Ref Rng & Units 08/16/2021 07/19/2021 07/12/2021  WBC 4.0 - 10.5 K/uL 4.3 7.1 8.1  Hemoglobin 12.0 - 15.0 g/dL 12.7 11.0(L) 11.2(L)  Hematocrit 36.0 - 46.0 % 38.7 32.9(L) 34.0(L)  Platelets 150 - 400 K/uL 280 222 212    CMP Latest Ref Rng & Units 08/16/2021 07/19/2021 07/12/2021  Glucose 70 - 99 mg/dL 88 97 88  BUN 8 - 23 mg/dL _0 Creatinine 0.44 - 1.00 mg/dL 0.81  0.79 0.75  Sodium 135 - 145 mmol/L 140 139 138  Potassium 3.5 - 5.1 mmol/L 3.9 3.8 3.8  Chloride 98 - 111 mmol/L 106 108 104  CO2 22 - 32 mmol/L _1 Calcium 8.9 - 10.3 mg/dL 10.0 9.6 9.4  Total Protein 6.5 - 8.1 g/dL 7.1 6.1(L) 6.1(L)  Total Bilirubin 0.3 - 1.2 mg/dL 0.9 0.6 0.9  Alkaline Phos 38 - 126 U/L 58 61 56  AST 15 - 41 U/L _2 ALT 0 - 44 U/L _3 Lab Results  Component Value Date   MPROTEIN Not Observed 06/28/2021   MPROTEIN Not Observed 05/24/2021   MPROTEIN Not Observed 05/03/2021   Lab Results  Component Value Date   KPAFRELGTCHN 14.5 06/28/2021   KPAFRELGTCHN 16.1 05/24/2021   KPAFRELGTCHN 19.7 (H) 05/03/2021   LAMBDASER 25.8 06/28/2021   LAMBDASER 33.8 (H) 05/24/2021   LAMBDASER 51.7 (H) 05/03/2021   KAPLAMBRATIO 7.10 07/05/2021   KAPLAMBRATIO 0.56 06/28/2021   KAPLAMBRATIO 5.93 05/31/2021    RADIOGRAPHIC STUDIES: No results found.  ASSESSMENT & PLAN Doris Wilkerson 75 y.o. female with medical history significant for lambda light chain multiple myeloma who presents for a follow up visit.   After review the labs, review the records, schedule the patient the findings most consistent with a lambda light chain multiple myeloma.  This is getting confirmed with a bone marrow biopsy showing an abnormal plasma cell population of 28% and M protein/Bence-Jones proteins in the urine.  Given these findings the patient now carries a diagnosis of lambda light chain multiple myeloma.  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  This consists of bortezomib 1.3 mg per metered squared q week, lenalidomide 25 mg p.o. x14 days of 21-day cycle, and dexamethasone 40 mg p.o. q. weekly.  This will be continued until he reached a  VGPR/ complete 8 cycles of triplet therapy, at which time we can consider transition over to maintenance Revlimid 10 mg p.o. daily 21 of 28 days per cycle.  Ms. Bakula is not a candidate for bone marrow  transplant based on her advanced age.  She transition to maintenance Revlimid on 07/19/2021.  # Lambda Light Chain Multiple Myeloma --diagnosis confirmed with bone marrow biopsy and urine protein analysis.   --patient has good functional status and would be an excellent candidate for VRd chemotherapy. I do not believe she would be a bone marrow transplant candidate based on her age.  --assure monthly restaging labs with SPEP, UPEP, and SFLC --patient declines port placement at this time --Cycle 1 Day 1 of therapy started on 02/01/2021.  --she completed Cycle 8 Day 1 of Vrd, started maintenance Revlimid on 07/19/2021. --excellent response noted within 1st cycle, normalization of urine free light chains and undetectable M protein.  Plan: -- Continue maintenance Revlimid 10 mg p.o. daily with 21 days on and 7 days off --Monthly restaging labs with serum free light chains, UPEP, and SPEP --Return to clinic every 4 weeks  #Supportive Care --chemotherapy education complete  --patient declines port placement at this time --zofran 1m q8H PRN and compazine 119mPO q6H for nausea --acyclovir can be d/c --zometa therapy performed on 06/28/2021. Repeat q 3 months.  -- no pain medication required at this time.   No orders of the defined types were placed in this encounter.  All questions were answered. The patient knows to call the clinic with any problems, questions or concerns.  A total of more than 30 minutes were spent on this encounter and over half of that time was spent on counseling and coordination of care as outlined above.   JoLedell PeoplesMD Department of Hematology/Oncology [place]Pol[place] Cityt WeG[facility] [place]: [phone]: [phone]: [email or web address]  08/16/2021 12:21 PM   Literature Support:  1) SuBennett Scrapeortezomib,  lenalidomide, and dexamethasone in transplant-eligible newly diagnosed multiple myeloma patients: a multicenter retrospective comparative analysis. Int J Hinton Dyer2020 Jan;111(1):103-111. doi: 10.1007/s12185-019-02764-1.  -- Overall response, very good partial response, and complete response rates after VRD were 96.4%, 45.5%, and 20.0%, respectively (median follow-up period, 17.7 months). The 1-year progression-free survival (PFS) and overall survival rates were 95.8% and 98.2%, respectively. The response rate and PFS were similar between the groups, regardless of cytogenetic risk and age.  2) JaCasandra Doff[address], Pawlyn C,Loletha GrayerCaWillistonStTexarkanaCoKemahHockaday A, Jones JR, Kishore B, Garg M, Williams CD, Karunanithi K,Delaine LameW, CoLaban EmperorH, KaCliffordDrayson MT, OwRobinette HainesM, MoMyra RudeUKVenezuelaCRI [facility]. Lenalidomide maintenance versus observation for patients with newly diagnosed multiple myeloma (Myeloma XI): a multicentre, open-label, randomised, phase 3 trial. Lancet Oncol. 2019 Jan;20(1):57-73. doi: 10.1016/S1470-2045(18)30687-9. Epub 2018 Dec 14.   --Maintenance therapy with lenalidomide significantly improved progression-free survival in patients with newly diagnosed multiple myeloma compared with observation   --After a median follow-up of 31 months (IQR 18-50), median progression-free survival was 39 months (95% CI 36-42) with lenalidomide and 20 months (18-22) with observation (hazard ratio [HR] 046 [95% CI 041-053]; p<00001), and 3-year overall survival was 786% (95% Cl 756-816) in the lenalidomide group and 758% (7(644-034in the observation group (HR 087 [95% CI 073-105]; p=015). Progression-free  survival was improved with lenalidomide compared with observation across all prespecified subgroups.

## 2021-08-19 LAB — UPEP/UIFE/LIGHT CHAINS/TP, 24-HR UR
% BETA, Urine: 0 %
ALPHA 1 URINE: 0 %
Albumin, U: 100 %
Alpha 2, Urine: 0 %
Free Kappa Lt Chains,Ur: 3.26 mg/L (ref 1.17–86.46)
Free Kappa/Lambda Ratio: >4.87 (ref 1.83–14.26)
Free Lambda Lt Chains,Ur: <0.67 mg/L (ref 0.27–15.21)
GAMMA GLOBULIN URINE: 0 %
Total Protein, Urine-Ur/day: <102 mg/24 hr (ref 30–150)
Total Protein, Urine: <4 mg/dL
Total Volume: 2550

## 2021-08-19 LAB — KAPPA/LAMBDA LIGHT CHAINS
Kappa free light chain: 18.4 mg/L (ref 3.3–19.4)
Kappa, lambda light chain ratio: 0.54 (ref 0.26–1.65)
Lambda free light chains: 34.2 mg/L — ABNORMAL HIGH (ref 5.7–26.3)

## 2021-08-20 LAB — MULTIPLE MYELOMA PANEL, SERUM
Albumin SerPl Elph-Mcnc: 4.1 g/dL (ref 2.9–4.4)
Albumin/Glob SerPl: 2 — ABNORMAL HIGH (ref 0.7–1.7)
Alpha 1: 0.2 g/dL (ref 0.0–0.4)
Alpha2 Glob SerPl Elph-Mcnc: 0.7 g/dL (ref 0.4–1.0)
B-Globulin SerPl Elph-Mcnc: 0.8 g/dL (ref 0.7–1.3)
Gamma Glob SerPl Elph-Mcnc: 0.4 g/dL (ref 0.4–1.8)
Globulin, Total: 2.1 g/dL — ABNORMAL LOW (ref 2.2–3.9)
IgA: 99 mg/dL (ref 64–422)
IgG (Immunoglobin G), Serum: 513 mg/dL — ABNORMAL LOW (ref 586–1602)
IgM (Immunoglobulin M), Srm: 20 mg/dL — ABNORMAL LOW (ref 26–217)
Total Protein ELP: 6.2 g/dL (ref 6.0–8.5)

## 2021-08-26 ENCOUNTER — Encounter: Payer: Self-pay | Admitting: Hematology and Oncology

## 2021-08-27 ENCOUNTER — Encounter: Payer: Self-pay | Admitting: Hematology and Oncology

## 2021-08-27 ENCOUNTER — Other Ambulatory Visit (HOSPITAL_COMMUNITY): Payer: Self-pay

## 2021-08-27 MED ORDER — LIDOCAINE VISCOUS HCL 2 % MT SOLN
Freq: Two times a day (BID) | OROMUCOSAL | 0 refills | Status: DC
  Filled 2021-08-27: qty 240, 8d supply, fill #0

## 2021-08-27 NOTE — Telephone Encounter (Signed)
Please do call in the lidocaine based magic mouthwash. Thank you.

## 2021-08-30 ENCOUNTER — Telehealth: Payer: Self-pay | Admitting: *Deleted

## 2021-08-30 ENCOUNTER — Other Ambulatory Visit: Payer: Self-pay | Admitting: *Deleted

## 2021-08-30 MED ORDER — LENALIDOMIDE 10 MG PO CAPS: 10.0000 mg | ORAL_CAPSULE | Freq: Every day | ORAL | 0 refills | Status: DC

## 2021-08-30 NOTE — Telephone Encounter (Signed)
-----   Message from Orson Slick, MD sent at 08/26/2021  9:59 AM EDT ----- Please let Doris Wilkerson know that her labs show continued undetectable M protein. Her Hgb and Cr are both excellent. We will continue to follow every month while she is on maintenance therapy.   ----- Message ----- From: Buel Ream, [facility] [place] Sent: 08/16/2021  10:46 AM EDT To: Orson Slick, MD

## 2021-08-30 NOTE — Telephone Encounter (Signed)
TCT patient regarding recent lab results. No answer but was able to leave detailed message on her identified phone #  Advised that her M protein remains undetectable and her HGB and creatinine are both excellent. Per Dr. Lorenso Courier, she will continue on her maintenance treatment plan. Advised to call back with  any questions or concerns @ [phone]

## 2021-09-03 ENCOUNTER — Telehealth: Payer: Self-pay | Admitting: *Deleted

## 2021-09-03 ENCOUNTER — Encounter: Payer: Self-pay | Admitting: Family Medicine

## 2021-09-03 ENCOUNTER — Telehealth (INDEPENDENT_AMBULATORY_CARE_PROVIDER_SITE_OTHER): Payer: Medicare Other | Admitting: Family Medicine

## 2021-09-03 VITALS — Temp 98.8°F

## 2021-09-03 DIAGNOSIS — I2510 Atherosclerotic heart disease of native coronary artery without angina pectoris: Secondary | ICD-10-CM | POA: Diagnosis not present

## 2021-09-03 DIAGNOSIS — U071 COVID-19: Secondary | ICD-10-CM

## 2021-09-03 DIAGNOSIS — Z20822 Contact with and (suspected) exposure to covid-19: Secondary | ICD-10-CM | POA: Diagnosis not present

## 2021-09-03 MED ORDER — BENZONATATE 100 MG PO CAPS: 100.0000 mg | ORAL_CAPSULE | Freq: Three times a day (TID) | ORAL | 0 refills | Status: DC | PRN

## 2021-09-03 MED ORDER — NIRMATRELVIR/RITONAVIR (PAXLOVID)TABLET: 3.0000 | ORAL_TABLET | Freq: Two times a day (BID) | ORAL | 0 refills | Status: AC

## 2021-09-03 NOTE — Patient Instructions (Addendum)
HOME CARE TIPS:   -I sent the medication(s) we discussed to your pharmacy: No orders of the defined types were placed in this encounter.    -I sent in the [place] treatment or referral you requested per our discussion. Please see the information provided below and discuss further with the pharmacist/treatment team.  -If taking Paxlovid, please review all medications, supplement and over the counter drugs with your pharmacist and ask them to check for any interactions. Please make the following changes to your regular medications while taking Paxlovid: *STOP the lipitor (atorvastatin) and restart 3 days after finishing your antiviral treatment  -there is a chance of rebound illness after finishing your treatment. If you become sick again please isolate for an additional 5 days, plus 5 more days of masking.    -can use nasal saline a few times per day if you have nasal congestion  -stay hydrated, drink plenty of fluids and eat small healthy meals - avoid dairy  -follow up with your doctor in 2-3 days unless improving and feeling better  -stay home while sick, except to seek medical care. If you have COVID19, ideally it would be best to stay home for a full 10 days since the onset of symptoms PLUS one day of no fever and feeling better. Wear a good mask that fits snugly (such as N95 or KN95) if around others to reduce the risk of transmission.  It was nice to meet you today, and I really hope you are feeling better soon. I help [place] out with telemedicine visits on Tuesdays and Thursdays and am available for visits on those days. If you have any concerns or questions following this visit please schedule a follow up visit with your Primary Care doctor or seek care at a local urgent care clinic to avoid delays in care.    Seek in person care or schedule a follow up video visit promptly if your symptoms worsen, new concerns arise or you are not improving with treatment. Call 911 and/or seek  emergency care if your symptoms are severe or life threatening.  FACT SHEET FOR PATIENTS, PARENTS, AND CAREGIVERS EMERGENCY USE AUTHORIZATION (EUA) OF PAXLOVID FOR CORONAVIRUS DISEASE 2019 (COVID-19) You are being given this Fact Sheet because your healthcare provider believes it is necessary to provide you with PAXLOVID for the treatment of mild-to-moderate coronavirus disease (COVID-19) caused by the SARS-CoV-2 virus. This Fact Sheet contains information to help you understand the risks and benefits of taking the PAXLOVID you have received or may receive. The U.S. Food and Drug Administration (FDA) has issued an Emergency Use Authorization (EUA) to make PAXLOVID available during the COVID-19 pandemic (for more details about an EUA please see "What is an Emergency Use Authorization?" at the end of this document). PAXLOVID is not an FDA-approved medicine in the [place]. Read this Fact Sheet for information about PAXLOVID. Talk to your healthcare provider about your options or if you have any questions. It is your choice to take PAXLOVID.  What is COVID-19? COVID-19 is caused by a virus called a coronavirus. You can get COVID-19 through close contact with another person who has the virus. COVID-19 illnesses have ranged from very mild-to-severe, including illness resulting in death. While information so far suggests that most COVID-19 illness is mild, serious illness can happen and may cause some of your other medical conditions to become worse. Older people and people of all ages with severe, long lasting (chronic) medical conditions like heart disease, lung disease, and diabetes,  for example seem to be at higher risk of being hospitalized for COVID-19.  What is PAXLOVID? PAXLOVID is an investigational medicine used to treat mild-to-moderate COVID-19 in adults and children [37 years of age and older weighing at least 36 pounds (41 kg)] with positive results of direct SARS-CoV-2  viral testing, and who are at high risk for progression to severe COVID-19, including hospitalization or death. PAXLOVID is investigational because it is still being studied. There is limited information about the safety and effectiveness of using PAXLOVID to treat people with mild-to-moderate COVID-19.  The FDA has authorized the emergency use of PAXLOVID for the treatment of mild-tomoderate COVID-19 in adults and children [10 years of age and older weighing at least 37 pounds (67 kg)] with a positive test for the virus that causes COVID-19, and who are at high risk for progression to severe COVID-19, including hospitalization or death, under an EUA. 1 Revised: 08 February 2021   What should I tell my healthcare provider before I take PAXLOVID? Tell your healthcare provider if you: ? Have any allergies ? Have liver or kidney disease ? Are pregnant or plan to become pregnant ? Are breastfeeding a child ? Have any serious illnesses  Tell your healthcare provider about all the medicines you take, including prescription and over-the-counter medicines, vitamins, and herbal supplements. Some medicines may interact with PAXLOVID and may cause serious side effects. Keep a list of your medicines to show your healthcare provider and pharmacist when you get a new medicine.  You can ask your healthcare provider or pharmacist for a list of medicines that interact with PAXLOVID. Do not start taking a new medicine without telling your healthcare provider. Your healthcare provider can tell you if it is safe to take PAXLOVID with other medicines.  Tell your healthcare provider if you are taking combined hormonal contraceptive. PAXLOVID may affect how your birth control pills work. Females who are able to become pregnant should use another effective alternative form of contraception or an additional barrier method of contraception. Talk to your healthcare provider if you have any questions about  contraceptive methods that might be right for you.  How do I take PAXLOVID? ? PAXLOVID consists of 2 medicines: nirmatrelvir and ritonavir. o Take 2 pink tablets of nirmatrelvir with 1 white tablet of ritonavir by mouth 2 times each day (in the morning and in the evening) for 5 days. For each dose, take all 3 tablets at the same time. o If you have kidney disease, talk to your healthcare provider. You may need a different dose. ? Swallow the tablets whole. Do not chew, break, or crush the tablets. ? Take PAXLOVID with or without food. ? Do not stop taking PAXLOVID without talking to your healthcare provider, even if you feel better. ? If you miss a dose of PAXLOVID within 8 hours of the time it is usually taken, take it as soon as you remember. If you miss a dose by more than 8 hours, skip the missed dose and take the next dose at your regular time. Do not take 2 doses of PAXLOVID at the same time. ? If you take too much PAXLOVID, call your healthcare provider or go to the nearest hospital emergency room right away. ? If you are taking a ritonavir- or cobicistat-containing medicine to treat hepatitis C or Human Immunodeficiency Virus (HIV), you should continue to take your medicine as prescribed by your healthcare provider. 2 Revised: 08 February 2021    Talk to your  healthcare provider if you do not feel better or if you feel worse after 5 days.  Who should generally not take PAXLOVID? Do not take PAXLOVID if: ? You are allergic to nirmatrelvir, ritonavir, or any of the ingredients in PAXLOVID. ? You are taking any of the following medicines: o Alfuzosin o Pethidine, propoxyphene o Ranolazine o Amiodarone, dronedarone, flecainide, propafenone, quinidine o Colchicine o Lurasidone, pimozide, clozapine o Dihydroergotamine, ergotamine, methylergonovine o Lovastatin, simvastatin o Sildenafil (Revatio) for pulmonary arterial hypertension (PAH) o Triazolam, oral midazolam o  Apalutamide o Carbamazepine, phenobarbital, phenytoin o Rifampin o St. John's Wort (hypericum perforatum) Taking PAXLOVID with these medicines may cause serious or life-threatening side effects or affect how PAXLOVID works.  These are not the only medicines that may cause serious side effects if taken with PAXLOVID. PAXLOVID may increase or decrease the levels of multiple other medicines. It is very important to tell your healthcare provider about all of the medicines you are taking because additional laboratory tests or changes in the dose of your other medicines may be necessary while you are taking PAXLOVID. Your healthcare provider may also tell you about specific symptoms to watch out for that may indicate that you need to stop or decrease the dose of some of your other medicines.  What are the important possible side effects of PAXLOVID? Possible side effects of PAXLOVID are: ? Allergic Reactions. Allergic reactions can happen in people taking PAXLOVID, even after only 1 dose. Stop taking PAXLOVID and call your healthcare provider right away if you get any of the following symptoms of an allergic reaction: o hives o trouble swallowing or breathing o swelling of the mouth, lips, or face o throat tightness o hoarseness 3 Revised: 08 February 2021  o skin rash ? Liver Problems. Tell your healthcare provider right away if you have any of these signs and symptoms of liver problems: loss of appetite, yellowing of your skin and the whites of eyes (jaundice), dark-colored urine, pale colored stools and itchy skin, stomach area (abdominal) pain. ? Resistance to HIV Medicines. If you have untreated HIV infection, PAXLOVID may lead to some HIV medicines not working as well in the future. ? Other possible side effects include: o altered sense of taste o diarrhea o high blood pressure o muscle aches These are not all the possible side effects of PAXLOVID. Not many people have  taken PAXLOVID. Serious and unexpected side effects may happen. PAXLOVID is still being studied, so it is possible that all of the risks are not known at this time.  What other treatment choices are there? Veklury (remdesivir) is FDA-approved for the treatment of mild-to-moderate VCBSW-96 in certain adults and children. Talk with your doctor to see if Marijean Heath is appropriate for you. Like PAXLOVID, FDA may also allow for the emergency use of other medicines to treat people with COVID-19. Go to https://price.info/ for information on the emergency use of other medicines that are authorized by FDA to treat people with COVID-19. Your healthcare provider may talk with you about clinical trials for which you may be eligible. It is your choice to be treated or not to be treated with PAXLOVID. Should you decide not to receive it or for your child not to receive it, it will not change your standard medical care.  What if I am pregnant or breastfeeding? There is no experience treating pregnant women or breastfeeding mothers with PAXLOVID. For a mother and unborn baby, the benefit of taking PAXLOVID may be greater than the  risk from the treatment. If you are pregnant, discuss your options and specific situation with your healthcare provider. It is recommended that you use effective barrier contraception or do not have sexual activity while taking PAXLOVID. If you are breastfeeding, discuss your options and specific situation with your healthcare provider. 4 Revised: 08 February 2021   How do I report side effects with PAXLOVID? Contact your healthcare provider if you have any side effects that bother you or do not go away. Report side effects to FDA MedWatch at SmoothHits.hu or call 1-800-FDA1088 or you can report side effects to [employer]. at the contact information  provided below. Website Fax number Telephone number www.pfizersafetyreporting.com [phone] How should I store [place]? Store PAXLOVID tablets at room temperature, between 68?F to 77?F (20?C to 25?C). How can I learn more about COVID-19? ? Ask your healthcare provider. ? Visit [email or web address]-johnson.[email or web address] ? Contact your local or state public health department. What is an Emergency Use Authorization (EUA)? The [place] FDA has made PAXLOVID available under an emergency access mechanism called an Emergency Use Authorization (EUA). The EUA is supported by a Education officer, museum and Human Service (HHS) declaration that circumstances exist to justify the emergency use of drugs and biological products during the COVID-19 pandemic. PAXLOVID for the treatment of mild-to-moderate COVID-19 in adults and children [21 years of age and older weighing at least 37 pounds (38 kg)] with positive results of direct SARS-CoV-2 viral testing, and who are at high risk for progression to severe COVID-19, including hospitalization or death, has not undergone the same type of review as an FDA-approved product. In issuing an EUA under the SJGGE-36 public health emergency, the FDA has determined, among other things, that based on the total amount of scientific evidence available including data from adequate and well-controlled clinical trials, if available, it is reasonable to believe that the product may be effective for diagnosing, treating, or preventing COVID-19, or a serious or life-threatening disease or condition caused by COVID-19; that the known and potential benefits of the product, when used to diagnose, treat, or prevent such disease or condition, outweigh the known and potential risks of such product; and that there are no adequate, approved, and available alternatives. All of these criteria must be met to allow for the product to be used in the treatment of patients during  the COVID-19 pandemic. The EUA for PAXLOVID is in effect for the duration of the COVID-19 declaration justifying emergency use of this product, unless terminated or revoked (after which the products may no longer be used under the EUA). 5 Revised: 08 February 2021     Additional Information For general questions, visit the website or call the telephone number provided below. Website Telephone number www.COVID19oralRx.com [phone] (1-877-C19-PACK) You can also go to www.pfizermedinfo.com or call [phone] for more information. [id number] Revised: 08 February 2021

## 2021-09-03 NOTE — Telephone Encounter (Signed)
Patient called to report that she tested positive for covid with at home test this morning.  Will cancel her labs that are scheduled within the 21 days time frame.    Will keep other appts as scheduled.

## 2021-09-03 NOTE — Progress Notes (Signed)
Virtual Visit via Video Note  I connected with Doris Wilkerson  on 09/03/21 at  1:00 PM EDT by a video enabled telemedicine application and verified that I am speaking with the correct person using two identifiers.  Location patient: home, [place] Location provider:work or home office Persons participating in the virtual visit: patient, provider  I discussed the limitations of evaluation and management by telemedicine and the availability of in person appointments. The patient expressed understanding and agreed to proceed.   HPI:  Acute telemedicine visit for Covid19: -Onset: 1 day ago; positive covid test today -Symptoms include: sore throat -Denies: fevers, CP, SOB, NVD, inability to eat/drink/get out of bed -Pertinent past medical history: see below -Pertinent medication allergies:  Allergies  Allergen Reactions   Poison Ivy Extract Swelling   Plavix [Clopidogrel Bisulfate]    Other     seasonal  -COVID-19 vaccine status:vaccinated x 2 and had 3 booster -GFR > 60 on labs 08/16/21 ROS: See pertinent positives and negatives per HPI.  Past Medical History:  Diagnosis Date   Asthma    Cataract    GERD (gastroesophageal reflux disease)    Hyperlipidemia    Hypertension    Thyroid disease     Past Surgical History:  Procedure Laterality Date   Cataract Surgery     CORONARY ANGIOPLASTY WITH STENT PLACEMENT     EYE SURGERY     ORTHOPEDIC SURGERY  2012   TONSILLECTOMY  1951     Current Outpatient Medications:    acetaminophen (TYLENOL 8 HOUR ARTHRITIS PAIN) 650 MG CR tablet, Take 1 tablet (650 mg total) by mouth every 8 (eight) hours as needed for pain., Disp: , Rfl:    aspirin 81 MG tablet, Take 81 mg by mouth daily. , Disp: , Rfl:    atorvastatin (LIPITOR) 80 MG tablet, TAKE 1 TABLET(80 MG) BY MOUTH DAILY AT 6 PM, Disp: 90 tablet, Rfl: 1   benazepril (LOTENSIN) 10 MG tablet, TAKE 1 TABLET(10 MG) BY MOUTH DAILY, Disp: 90 tablet, Rfl: 1   calcium carbonate (OS-CAL) 1250 (500 Ca) MG  chewable tablet, Chew 1 tablet by mouth daily., Disp: , Rfl:    cholecalciferol (VITAMIN D3) 25 MCG (1000 UNIT) tablet, Take 2,000 Units by mouth daily., Disp: , Rfl:    Cyanocobalamin (B-12 PO), Take by mouth daily., Disp: , Rfl:    DM-APAP-CPM (CORICIDIN HBP PO), Take by mouth. , Disp: , Rfl:    famotidine-calcium carbonate-magnesium hydroxide (PEPCID COMPLETE) 10-800-165 MG chewable tablet, Chew 1 tablet by mouth daily., Disp: , Rfl:    lenalidomide (REVLIMID) 10 MG capsule, Take 1 capsule (10 mg total) by mouth daily. [employer] Auth # [id number]    Date Obtained 08/30/21  Take 1 tablet daily for 21 days. None for 7 days, Disp: 21 capsule, Rfl: 0   levothyroxine (SYNTHROID) 25 MCG tablet, TAKE 1 TABLET(25 MCG) BY MOUTH DAILY BEFORE BREAKFAST, Disp: 90 tablet, Rfl: 0   magic mouthwash (lidocaine, diphenhydrAMINE, alum & mag hydroxide) suspension, Swish and spit 5 MLs by mouth every 4 hours as needed for mouth sores, Disp: 240 mL, Rfl: 0   MAGNESIUM PO, Take by mouth daily., Disp: , Rfl:    ondansetron (ZOFRAN) 8 MG tablet, Take 1 tablet (8 mg total) by mouth every 8 (eight) hours as needed for nausea or vomiting., Disp: 30 tablet, Rfl: 0   prochlorperazine (COMPAZINE) 10 MG tablet, Take 1 tablet (10 mg total) by mouth every 6 (six) hours as needed for nausea or vomiting., Disp: 30  tablet, Rfl: 0  EXAM:  VITALS per patient if applicable:  GENERAL: alert, oriented, appears well and in no acute distress  HEENT: atraumatic, conjunttiva clear, no obvious abnormalities on inspection of external nose and ears  NECK: normal movements of the head and neck  LUNGS: on inspection no signs of respiratory distress, breathing rate appears normal, no obvious gross SOB, gasping or wheezing  CV: no obvious cyanosis  MS: moves all visible extremities without noticeable abnormality  PSYCH/NEURO: pleasant and cooperative, no obvious depression or anxiety, speech and thought processing grossly intact  ASSESSMENT  AND PLAN:  Discussed the following assessment and plan:  COVID-19   Discussed treatment options (infusions and oral options and risk of drug interactions), ideal treatment window, potential complications, isolation and precautions for COVID-19.  Discussed possibility of rebound with antivirals and the need to reisolate if it should occur for 5 days. Checked for/reviewed any labs done in the last 90 days with GFR listed in HPI if available. After lengthy discussion, the patient opted for treatment with Paxlovid due to being higher risk for complications of covid or severe disease and other factors. Discussed EUA status of this drug and the fact that there is preliminary limited knowledge of risks/interactions/side effects per EUA document vs possible benefits and precautions. This information was shared with patient during the visit and also was provided in patient instructions. Also, advised that patient discuss risks/interactions and use with pharmacist/treatment team as well.  The patient did want a prescription for cough, Tessalon Rx sent.  Other symptomatic care measures summarized in patient instructions.  Advised to seek prompt in person care if worsening, new symptoms arise, or if is not improving with treatment. Discussed options for inperson care if PCP office not available. Did let this patient know that I only do telemedicine on Tuesdays and Thursdays for [place]. Advised to schedule follow up visit with PCP or [facility] if any further questions or concerns to avoid delays in care.   I discussed the assessment and treatment plan with the patient. The patient was provided an opportunity to ask questions and all were answered. The patient agreed with the plan and demonstrated an understanding of the instructions.     Lucretia Kern, DO

## 2021-09-05 ENCOUNTER — Encounter: Payer: Self-pay | Admitting: *Deleted

## 2021-09-08 ENCOUNTER — Encounter: Payer: Self-pay | Admitting: Family Medicine

## 2021-09-13 ENCOUNTER — Ambulatory Visit: Payer: Medicare Other | Admitting: Hematology and Oncology

## 2021-09-13 ENCOUNTER — Other Ambulatory Visit: Payer: Medicare Other

## 2021-09-13 ENCOUNTER — Ambulatory Visit: Payer: Medicare Other

## 2021-09-17 DIAGNOSIS — Z23 Encounter for immunization: Secondary | ICD-10-CM | POA: Diagnosis not present

## 2021-09-23 ENCOUNTER — Other Ambulatory Visit: Payer: Self-pay | Admitting: Family Medicine

## 2021-09-23 DIAGNOSIS — E039 Hypothyroidism, unspecified: Secondary | ICD-10-CM

## 2021-09-24 ENCOUNTER — Other Ambulatory Visit: Payer: Self-pay | Admitting: Family Medicine

## 2021-09-24 ENCOUNTER — Encounter: Payer: Self-pay | Admitting: Family Medicine

## 2021-09-24 ENCOUNTER — Other Ambulatory Visit: Payer: Self-pay

## 2021-09-24 DIAGNOSIS — E039 Hypothyroidism, unspecified: Secondary | ICD-10-CM

## 2021-09-24 MED ORDER — LEVOTHYROXINE SODIUM 25 MCG PO TABS: ORAL_TABLET | ORAL | 0 refills | Status: DC

## 2021-09-25 ENCOUNTER — Other Ambulatory Visit: Payer: Self-pay | Admitting: *Deleted

## 2021-09-25 ENCOUNTER — Other Ambulatory Visit (HOSPITAL_COMMUNITY): Payer: Self-pay

## 2021-09-25 MED ORDER — LENALIDOMIDE 10 MG PO CAPS: 10.0000 mg | ORAL_CAPSULE | Freq: Every day | ORAL | 0 refills | Status: DC

## 2021-09-27 ENCOUNTER — Inpatient Hospital Stay: Payer: Medicare Other

## 2021-09-27 ENCOUNTER — Encounter: Payer: Self-pay | Admitting: Hematology and Oncology

## 2021-09-27 ENCOUNTER — Other Ambulatory Visit: Payer: Medicare Other

## 2021-09-27 ENCOUNTER — Inpatient Hospital Stay: Payer: Medicare Other | Admitting: Dietician

## 2021-09-27 ENCOUNTER — Inpatient Hospital Stay: Payer: Medicare Other | Attending: Hematology and Oncology | Admitting: Hematology and Oncology

## 2021-09-27 ENCOUNTER — Other Ambulatory Visit: Payer: Self-pay

## 2021-09-27 VITALS — BP 133/73 | HR 86 | Temp 98.2°F | Resp 18 | Wt 140.9 lb

## 2021-09-27 DIAGNOSIS — Z7982 Long term (current) use of aspirin: Secondary | ICD-10-CM | POA: Insufficient documentation

## 2021-09-27 DIAGNOSIS — C9000 Multiple myeloma not having achieved remission: Secondary | ICD-10-CM | POA: Diagnosis not present

## 2021-09-27 DIAGNOSIS — I10 Essential (primary) hypertension: Secondary | ICD-10-CM | POA: Insufficient documentation

## 2021-09-27 DIAGNOSIS — M899 Disorder of bone, unspecified: Secondary | ICD-10-CM | POA: Diagnosis not present

## 2021-09-27 DIAGNOSIS — C9001 Multiple myeloma in remission: Secondary | ICD-10-CM

## 2021-09-27 DIAGNOSIS — E079 Disorder of thyroid, unspecified: Secondary | ICD-10-CM | POA: Diagnosis not present

## 2021-09-27 DIAGNOSIS — Z79899 Other long term (current) drug therapy: Secondary | ICD-10-CM | POA: Diagnosis not present

## 2021-09-27 DIAGNOSIS — E785 Hyperlipidemia, unspecified: Secondary | ICD-10-CM | POA: Diagnosis not present

## 2021-09-27 LAB — CMP (CANCER CENTER ONLY)
ALT: 22 U/L (ref 0–44)
AST: 21 U/L (ref 15–41)
Albumin: 4.2 g/dL (ref 3.5–5.0)
Alkaline Phosphatase: 57 U/L (ref 38–126)
Anion gap: 8 (ref 5–15)
BUN: 11 mg/dL (ref 8–23)
CO2: 22 mmol/L (ref 22–32)
Calcium: 9.4 mg/dL (ref 8.9–10.3)
Chloride: 108 mmol/L (ref 98–111)
Creatinine: 0.71 mg/dL (ref 0.44–1.00)
GFR, Estimated: >60 mL/min (ref >60)
Glucose, Bld: 118 mg/dL — ABNORMAL HIGH (ref 70–99)
Potassium: 3.8 mmol/L (ref 3.5–5.1)
Sodium: 138 mmol/L (ref 135–145)
Total Bilirubin: 0.7 mg/dL (ref 0.3–1.2)
Total Protein: 6.8 g/dL (ref 6.5–8.1)

## 2021-09-27 LAB — CBC WITH DIFFERENTIAL (CANCER CENTER ONLY)
Abs Immature Granulocytes: 0.01 10*3/uL (ref 0.00–0.07)
Basophils Absolute: 0.1 10*3/uL (ref 0.0–0.1)
Basophils Relative: 2 %
Eosinophils Absolute: 0.3 10*3/uL (ref 0.0–0.5)
Eosinophils Relative: 5 %
HCT: 37 % (ref 36.0–46.0)
Hemoglobin: 12.4 g/dL (ref 12.0–15.0)
Immature Granulocytes: 0 %
Lymphocytes Relative: 21 %
Lymphs Abs: 1.1 10*3/uL (ref 0.7–4.0)
MCH: 29 pg (ref 26.0–34.0)
MCHC: 33.5 g/dL (ref 30.0–36.0)
MCV: 86.7 fL (ref 80.0–100.0)
Monocytes Absolute: 0.7 10*3/uL (ref 0.1–1.0)
Monocytes Relative: 15 %
Neutro Abs: 2.8 10*3/uL (ref 1.7–7.7)
Neutrophils Relative %: 57 %
Platelet Count: 260 10*3/uL (ref 150–400)
RBC: 4.27 MIL/uL (ref 3.87–5.11)
RDW: 14.4 % (ref 11.5–15.5)
WBC Count: 5 10*3/uL (ref 4.0–10.5)
nRBC: 0 % (ref 0.0–0.2)

## 2021-09-27 LAB — LACTATE DEHYDROGENASE: LDH: 181 U/L (ref 98–192)

## 2021-09-27 MED ORDER — ZOLEDRONIC ACID 4 MG/100ML IV SOLN
4.0000 mg | Freq: Once | INTRAVENOUS | Status: AC
  Administered 2021-09-27: 4 mg via INTRAVENOUS
  Filled 2021-09-27: qty 100

## 2021-09-27 MED ORDER — SODIUM CHLORIDE 0.9 % IV SOLN: Freq: Once | INTRAVENOUS | Status: AC

## 2021-09-27 NOTE — Patient Instructions (Signed)

## 2021-09-27 NOTE — Progress Notes (Signed)
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 Concerning for multiple myeloma vs metastatic disease 12/31/2020: establish care with Dr. Lorenso Courier. UPEP showed marked Bence Jones protein, findings concerning for multiple myleoma 01/21/2021: bone marrow biopsy confirms multiple myeloma with atypical plasma cells representing 28% of all cells in the aspirate  02/01/2021: Cycle 1 Day 1 of VRd chemotherapy 02/22/2021: Cycle 2 Day 1 of VRd chemotherapy 03/15/2021: Cycle 3 Day 1 of VRd chemotherapy 04/05/2021: Cycle 4 Day 1 of VRd chemotherapy 04/26/2021: Cycle 5 Day 1 of VRd chemotherapy 05/17/2021: Cycle 6 Day 1 of VRd chemotherapy 06/07/2021:  Cycle 7 Day 1 of VRd chemotherapy 06/28/2021: Cycle 8 Day 1 of VRd chemotherapy 07/19/2021: start maintenance revlimid 8m PO daily   Interval History:  LManuela Halbur89y.o. female with medical history significant for lambda light chain multiple myeloma who presents for a follow up visit. The patient's last visit was on 08/16/2021. In the interim since she hs continued maintenance Revlimid.  On exam today Mrs. Cendejas reports she reports that the gum swelling improved markedly with the Magic mouthwash.  She has unfortunately she did have a COVID infection on 08/31/2021.  She did not have any major symptoms as result of COVID infection such as fever.  She did have some sore throat but fortunately has made a full recovery and does not have any residual symptoms.  She did lose some weight during that period of time and dropped down to 140 pounds from 149 on 07/19/2021.  She did  take the COVID medications as prescribed by her primary care provider.  She is also been continuously taking her Revlimid 10 mg p.o. reports that she is doing quite well.  She denies any fevers, chills, sweats, nausea, vomiting or diarrhea. A full 10 point ROS is listed below.   MEDICAL HISTORY:  Past Medical History:  Diagnosis Date   Asthma    Cataract    GERD (gastroesophageal reflux disease)    Hyperlipidemia    Hypertension    Thyroid disease     SURGICAL HISTORY: Past Surgical History:  Procedure Laterality Date   Cataract Surgery     CORONARY ANGIOPLASTY WITH STENT PLACEMENT     EYE SURGERY     ORTHOPEDIC SURGERY  2012   TONSILLECTOMY  1951    SOCIAL HISTORY: Social History   Socioeconomic History   Marital status: Married    Spouse name: Not on file   Number of children: 0   Years of education: Not on file   Highest education level: Not on file  Occupational History   Occupation: unemployed  Tobacco Use   Smoking status: Never   Smokeless tobacco: Never  Vaping Use   Vaping Use: Never used  Substance and Sexual Activity   Alcohol use: Yes    Alcohol/week: 1.0 standard drink    Types: 1 Glasses of wine per week    Comment: occassionally   Drug use: No   Sexual activity: Not Currently  Other Topics Concern   Not on file  Social History Narrative   Married. Education: college. Pt does exercise-   Social Determinants of Health  Financial Resource Strain: Not on file  Food Insecurity: Not on file  Transportation Needs: Not on file  Physical Activity: Not on file  Stress: Not on file  Social Connections: Not on file  Intimate Partner Violence: Not on file    FAMILY HISTORY: Family History  Problem Relation Age of Onset   Cancer Father    Heart disease Brother    Dementia Brother    Leukemia Brother    Breast cancer Neg Hx    Colon cancer Neg Hx    Esophageal cancer Neg Hx    Pancreatic cancer Neg Hx    Rectal cancer Neg Hx    Stomach cancer  Neg Hx     ALLERGIES:  is allergic to poison ivy extract, plavix [clopidogrel bisulfate], and other.  MEDICATIONS:  Current Outpatient Medications  Medication Sig Dispense Refill   acetaminophen (TYLENOL 8 HOUR ARTHRITIS PAIN) 650 MG CR tablet Take 1 tablet (650 mg total) by mouth every 8 (eight) hours as needed for pain.     aspirin 81 MG tablet Take 81 mg by mouth daily.      atorvastatin (LIPITOR) 80 MG tablet TAKE 1 TABLET(80 MG) BY MOUTH DAILY AT 6 PM 90 tablet 1   benazepril (LOTENSIN) 10 MG tablet TAKE 1 TABLET(10 MG) BY MOUTH DAILY 90 tablet 1   benzonatate (TESSALON PERLES) 100 MG capsule Take 1 capsule (100 mg total) by mouth 3 (three) times daily as needed. 20 capsule 0   calcium carbonate (OS-CAL) 1250 (500 Ca) MG chewable tablet Chew 1 tablet by mouth daily.     cholecalciferol (VITAMIN D3) 25 MCG (1000 UNIT) tablet Take 2,000 Units by mouth daily.     Cyanocobalamin (B-12 PO) Take by mouth daily.     DM-APAP-CPM (CORICIDIN HBP PO) Take by mouth.      famotidine-calcium carbonate-magnesium hydroxide (PEPCID COMPLETE) 10-800-165 MG chewable tablet Chew 1 tablet by mouth daily.     lenalidomide (REVLIMID) 10 MG capsule Take 1 capsule (10 mg total) by mouth daily. [employer] Auth # [id number]   Date Obtained 09/25/21  Take 1 tablet daily for 21 days. None for 7 days 21 capsule 0   levothyroxine (SYNTHROID) 25 MCG tablet TAKE 1 TABLET(25 MCG) BY MOUTH DAILY BEFORE BREAKFAST 90 tablet 0   magic mouthwash (lidocaine, diphenhydrAMINE, alum & mag hydroxide) suspension Swish and spit 5 MLs by mouth every 4 hours as needed for mouth sores 240 mL 0   MAGNESIUM PO Take by mouth daily.     ondansetron (ZOFRAN) 8 MG tablet Take 1 tablet (8 mg total) by mouth every 8 (eight) hours as needed for nausea or vomiting. 30 tablet 0   prochlorperazine (COMPAZINE) 10 MG tablet Take 1 tablet (10 mg total) by mouth every 6 (six) hours as needed for nausea or vomiting. 30 tablet 0   No current  facility-administered medications for this visit.    REVIEW OF SYSTEMS:   Constitutional: ( - ) fevers, ( - )  chills , ( - ) night sweats Eyes: ( - ) blurriness of vision, ( - ) double vision, ( - ) watery eyes Ears, nose, mouth, throat, and face: ( - ) mucositis, ( - ) sore throat Respiratory: ( - ) cough, ( - ) dyspnea, ( - ) wheezes Cardiovascular: ( - ) palpitation, ( - ) chest discomfort, ( - ) lower extremity swelling Gastrointestinal:  ( - ) nausea, ( - ) heartburn, ( - ) change in bowel habits Skin: ( - )  abnormal skin rashes Lymphatics: ( - ) new lymphadenopathy, ( - ) easy bruising Neurological: ( - ) numbness, ( - ) tingling, ( - ) new weaknesses Behavioral/Psych: ( - ) mood change, ( - ) new changes  All other systems were reviewed with the patient and are negative.  PHYSICAL EXAMINATION: ECOG PERFORMANCE STATUS: 1 - Symptomatic but completely ambulatory  Vitals:   09/27/21 1039  BP: 133/73  Pulse: 86  Resp: 18  Temp: 98.2 F (36.8 C)  SpO2: 100%    Filed Weights   09/27/21 1039  Weight: 140 lb 14.4 oz (63.9 kg)    GENERAL: well appearing elderly Caucasian female in NAD  SKIN: skin color, texture, turgor are normal, no rashes or significant lesions EYES: conjunctiva are pink and non-injected, sclera clear LUNGS: clear to auscultation and percussion with normal breathing effort HEART: regular rate & rhythm and no murmurs and trace lower extremity edema Musculoskeletal: no cyanosis of digits and no clubbing  PSYCH: alert & oriented x 3, fluent speech NEURO: no focal motor/sensory deficits  LABORATORY DATA:  I have reviewed the data as listed CBC Latest Ref Rng & Units 09/27/2021 08/16/2021 07/19/2021  WBC 4.0 - 10.5 K/uL 5.0 4.3 7.1  Hemoglobin 12.0 - 15.0 g/dL 12.4 12.7 11.0(L)  Hematocrit 36.0 - 46.0 % 37.0 38.7 32.9(L)  Platelets 150 - 400 K/uL 260 280 222    CMP Latest Ref Rng & Units 09/27/2021 08/16/2021 07/19/2021  Glucose 70 - 99 mg/dL 118(H) 88 97   BUN 8 - 23 mg/dL _0 Creatinine 0.44 - 1.00 mg/dL 0.71 0.81 0.79  Sodium 135 - 145 mmol/L 138 140 139  Potassium 3.5 - 5.1 mmol/L 3.8 3.9 3.8  Chloride 98 - 111 mmol/L 108 106 108  CO2 22 - 32 mmol/L _1 Calcium 8.9 - 10.3 mg/dL 9.4 10.0 9.6  Total Protein 6.5 - 8.1 g/dL 6.8 7.1 6.1(L)  Total Bilirubin 0.3 - 1.2 mg/dL 0.7 0.9 0.6  Alkaline Phos 38 - 126 U/L 57 58 61  AST 15 - 41 U/L _2 ALT 0 - 44 U/L _3 Lab Results  Component Value Date   MPROTEIN Not Observed 08/16/2021   MPROTEIN Not Observed 06/28/2021   MPROTEIN Not Observed 05/24/2021   Lab Results  Component Value Date   KPAFRELGTCHN 18.4 08/16/2021   KPAFRELGTCHN 14.5 06/28/2021   KPAFRELGTCHN 16.1 05/24/2021   LAMBDASER 34.2 (H) 08/16/2021   LAMBDASER 25.8 06/28/2021   LAMBDASER 33.8 (H) 05/24/2021   KAPLAMBRATIO >4.87 08/16/2021   KAPLAMBRATIO 0.54 08/16/2021   KAPLAMBRATIO 7.10 07/05/2021    RADIOGRAPHIC STUDIES: No results found.  ASSESSMENT & PLAN Halona Amstutz 75 y.o. female with medical history significant for lambda light chain multiple myeloma who presents for a follow up visit.   After review the labs, review the records, schedule the patient the findings most consistent with a lambda light chain multiple myeloma.  This is getting confirmed with a bone marrow biopsy showing an abnormal plasma cell population of 28% and M protein/Bence-Jones proteins in the urine.  Given these findings the patient now carries a diagnosis of lambda light chain multiple myeloma.  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  This consists of bortezomib 1.3 mg per metered squared q week, lenalidomide 25 mg p.o. x14 days of 21-day cycle, and dexamethasone 40 mg p.o. q. weekly.  This will be continued until he reached  a VGPR/ complete 8 cycles of triplet therapy, at which time we can consider transition over to maintenance Revlimid 10 mg p.o. daily 21 of 28 days  per cycle.  Ms. Mcduffie is not a candidate for bone marrow transplant based on her advanced age.  She transition to maintenance Revlimid on 07/19/2021.  # Lambda Light Chain Multiple Myeloma--VGPR on maintenance --diagnosis confirmed with bone marrow biopsy and urine protein analysis.   --patient has good functional status and would be an excellent candidate for VRd chemotherapy. I do not believe she would be a bone marrow transplant candidate based on her age.  --assure monthly restaging labs with SPEP, UPEP, and SFLC --patient declines port placement at this time --Cycle 1 Day 1 of therapy started on 02/01/2021.  --she completed Cycle 8 Day 1 of Vrd, started maintenance Revlimid on 07/19/2021. --excellent response noted within 1st cycle, normalization of urine free light chains and undetectable M protein.  Plan: -- Continue maintenance Revlimid 10 mg p.o. daily with 21 days on and 7 days off --Monthly restaging labs with serum free light chains, UPEP, and SPEP --Return to clinic every 4 weeks  #Supportive Care --chemotherapy education complete  --zofran 90m q8H PRN and compazine 160mPO q6H for nausea --acyclovir can be d/c --zometa therapy performed on 09/27/2021. Repeat q 3 months.  -- no pain medication required at this time.   No orders of the defined types were placed in this encounter.  All questions were answered. The patient knows to call the clinic with any problems, questions or concerns.  A total of more than 30 minutes were spent on this encounter and over half of that time was spent on counseling and coordination of care as outlined above.   JoLedell PeoplesMD Department of Hematology/Oncology CoAdamst WeHouston Va Medical Centerhone: [phone]: [phone]: [email or web address]  09/27/2021 4:20 PM   Literature Support:  1) SuBennett Scrapeortezomib, lenalidomide, and dexamethasone in transplant-eligible newly diagnosed multiple myeloma patients: a multicenter retrospective comparative analysis. Int J Hinton Dyer2020 Jan;111(1):103-111. doi: 10.1007/s12185-019-02764-1.  -- Overall response, very good partial response, and complete response rates after VRD were 96.4%, 45.5%, and 20.0%, respectively (median follow-up period, 17.7 months). The 1-year progression-free survival (PFS) and overall survival rates were 95.8% and 98.2%, respectively. The response rate and PFS were similar between the groups, regardless of cytogenetic risk and age.  2) JaCasandra DoffingDa8[address]th LaneE, Pawlyn C,Loletha GrayerCaRural HillStIrvingtonCoPoplar-Cotton CenterHockaday A, Jones JR, Kishore B, Garg M, Williams CD, Karunanithi K,Delaine LameW, CoLaban EmperorH, KaCentral SquareDrayson MT, OwRobinette HainesM, MoMyra RudeUKVenezuelaCRI [facility]on[facility] Studies Group. Lenalidomide maintenance versus observation for patients with newly diagnosed multiple myeloma (Myeloma XI): a multicentre, open-label, randomised, phase 3 trial. Lancet Oncol. 2019 Jan;20(1):57-73. doi: 10.1016/S1470-2045(18)30687-9. Epub 2018 Dec 14.   --Maintenance therapy with lenalidomide significantly improved progression-free survival in patients with newly diagnosed multiple myeloma compared with observation   --After a median follow-up of 31 months (IQR 18-50), median progression-free survival was 39 months (95% CI 36-42) with lenalidomide and 20 months (18-22) with observation (hazard ratio [HR] 046 [95% CI 041-053]; p<00001), and 3-year overall survival was 786% (95% Cl 756-816) in the lenalidomide group and 758% (7(517-616in the observation group (HR 087 [95% CI 073-105]; p=015). Progression-free survival was improved with  lenalidomide compared with observation across all prespecified subgroups.

## 2021-09-27 NOTE — Progress Notes (Signed)
Nutrition Assessment  Reason for Assessment: MST (+wt loss)   ASSESSMENT: 75 year old female with multiple myeloma. She is receiving maintenance Revlimid. Patient is followed by Dr. Lorenso Courier.  Past medical history includes CAD, hypothyroidism, osteopenia, constipation, GERD, HTN, recent Covid infection.     Met with patient during infusion. She reports decreased appetite, lost of interest in foods she typically eats. Patient reports small bowl of muesli and boiled egg for breakfast. She reports this does not appeal to her along with other foods such a salmon that she usually likes and eats quite frequently. Patient denies nausea, vomiting, constipation. She reports episodes of loose/liquid bowel movements after treatment furthering her decreased desire to enjoy food. She does not like Boost/Ensure supplements.   Nutrition Focused Physical Exam: deferred   Medications: Os-cal, B12, D3, Magnesium, Zofran, Compazine, Synthroid, Magic mouthwash   Labs: Glucose 118   Anthropometrics: Weight 140 lb 14.4 oz today decreased 14 lbs (9%) from usual weight in 5 months. This is significant for time frame.  Height: 5'6" Weight: 63.9 kg UBW: 154 lb (04/26/21) BMI: 22.74    NUTRITION DIAGNOSIS: Unintentional weight loss related to cancer and associated treatments as evidenced by reported decreased appetite and 9% decrease from usual weight in 5 months which is significant for time frame.    INTERVENTION:  Discussed importance of adequate energy intake to maintain strength, weights, nutrition and discussed strategies for increasing calories and protein, making the most of every bite - handout provided Encouraged eating small frequent meals and snacks with adequate calories and protein - handout with snack ideas provided Encouraged high calorie, high protein foods, liberalized diet and trying variety of foods vs eating same items as usual Discussed strategies for enhancing flavors of food - handout  provided Discussed fiber for bowel management - handout provided Contact information provided   MONITORING, EVALUATION, GOAL: Patient will tolerate increased calories and protein to minimize weight loss   Next Visit: To be scheduled with treatment

## 2021-09-30 ENCOUNTER — Encounter: Payer: Self-pay | Admitting: Hematology and Oncology

## 2021-09-30 LAB — KAPPA/LAMBDA LIGHT CHAINS
Kappa free light chain: 27 mg/L — ABNORMAL HIGH (ref 3.3–19.4)
Kappa, lambda light chain ratio: 0.77 (ref 0.26–1.65)
Lambda free light chains: 35.1 mg/L — ABNORMAL HIGH (ref 5.7–26.3)

## 2021-10-01 LAB — UPEP/UIFE/LIGHT CHAINS/TP, 24-HR UR
% BETA, Urine: 0 %
ALPHA 1 URINE: 0 %
Albumin, U: 100 %
Alpha 2, Urine: 0 %
Free Kappa Lt Chains,Ur: 20.85 mg/L (ref 1.17–86.46)
Free Kappa/Lambda Ratio: 8.18 (ref 1.83–14.26)
Free Lambda Lt Chains,Ur: 2.55 mg/L (ref 0.27–15.21)
GAMMA GLOBULIN URINE: 0 %
Total Protein, Urine-Ur/day: 148 mg/24 hr (ref 30–150)
Total Protein, Urine: 5.6 mg/dL
Total Volume: 2650

## 2021-10-01 LAB — MULTIPLE MYELOMA PANEL, SERUM
Albumin SerPl Elph-Mcnc: 3.9 g/dL (ref 2.9–4.4)
Albumin/Glob SerPl: 1.5 (ref 0.7–1.7)
Alpha 1: 0.2 g/dL (ref 0.0–0.4)
Alpha2 Glob SerPl Elph-Mcnc: 0.8 g/dL (ref 0.4–1.0)
B-Globulin SerPl Elph-Mcnc: 0.9 g/dL (ref 0.7–1.3)
Gamma Glob SerPl Elph-Mcnc: 0.7 g/dL (ref 0.4–1.8)
Globulin, Total: 2.7 g/dL (ref 2.2–3.9)
IgA: 149 mg/dL (ref 64–422)
IgG (Immunoglobin G), Serum: 777 mg/dL (ref 586–1602)
IgM (Immunoglobulin M), Srm: 28 mg/dL (ref 26–217)
Total Protein ELP: 6.6 g/dL (ref 6.0–8.5)

## 2021-10-11 ENCOUNTER — Ambulatory Visit: Payer: Medicare Other | Admitting: Hematology and Oncology

## 2021-10-11 ENCOUNTER — Other Ambulatory Visit: Payer: Medicare Other

## 2021-10-18 ENCOUNTER — Ambulatory Visit: Payer: Medicare Other | Admitting: Physician Assistant

## 2021-10-18 ENCOUNTER — Other Ambulatory Visit: Payer: Medicare Other

## 2021-10-18 ENCOUNTER — Ambulatory Visit: Payer: Medicare Other | Admitting: Hematology and Oncology

## 2021-10-24 ENCOUNTER — Other Ambulatory Visit: Payer: Self-pay

## 2021-10-24 DIAGNOSIS — C9000 Multiple myeloma not having achieved remission: Secondary | ICD-10-CM | POA: Diagnosis not present

## 2021-10-24 DIAGNOSIS — Z79899 Other long term (current) drug therapy: Secondary | ICD-10-CM | POA: Diagnosis not present

## 2021-10-24 DIAGNOSIS — Z7982 Long term (current) use of aspirin: Secondary | ICD-10-CM | POA: Diagnosis not present

## 2021-10-24 DIAGNOSIS — Z9221 Personal history of antineoplastic chemotherapy: Secondary | ICD-10-CM | POA: Diagnosis not present

## 2021-10-25 ENCOUNTER — Inpatient Hospital Stay (HOSPITAL_BASED_OUTPATIENT_CLINIC_OR_DEPARTMENT_OTHER): Payer: Medicare Other | Admitting: Hematology and Oncology

## 2021-10-25 ENCOUNTER — Inpatient Hospital Stay: Payer: Medicare Other | Attending: Hematology and Oncology

## 2021-10-25 ENCOUNTER — Other Ambulatory Visit: Payer: Self-pay

## 2021-10-25 VITALS — BP 123/60 | HR 57 | Temp 98.0°F | Resp 17 | Wt 143.6 lb

## 2021-10-25 DIAGNOSIS — C9000 Multiple myeloma not having achieved remission: Secondary | ICD-10-CM | POA: Diagnosis not present

## 2021-10-25 DIAGNOSIS — C9001 Multiple myeloma in remission: Secondary | ICD-10-CM | POA: Diagnosis not present

## 2021-10-25 DIAGNOSIS — M899 Disorder of bone, unspecified: Secondary | ICD-10-CM

## 2021-10-25 DIAGNOSIS — Z79899 Other long term (current) drug therapy: Secondary | ICD-10-CM | POA: Diagnosis not present

## 2021-10-25 DIAGNOSIS — Z9221 Personal history of antineoplastic chemotherapy: Secondary | ICD-10-CM | POA: Diagnosis not present

## 2021-10-25 DIAGNOSIS — Z7982 Long term (current) use of aspirin: Secondary | ICD-10-CM | POA: Diagnosis not present

## 2021-10-25 DIAGNOSIS — T451X5A Adverse effect of antineoplastic and immunosuppressive drugs, initial encounter: Secondary | ICD-10-CM | POA: Diagnosis not present

## 2021-10-25 DIAGNOSIS — D6481 Anemia due to antineoplastic chemotherapy: Secondary | ICD-10-CM | POA: Diagnosis not present

## 2021-10-25 LAB — CBC WITH DIFFERENTIAL (CANCER CENTER ONLY)
Abs Immature Granulocytes: 0.01 10*3/uL (ref 0.00–0.07)
Basophils Absolute: 0.1 10*3/uL (ref 0.0–0.1)
Basophils Relative: 1 %
Eosinophils Absolute: 0.3 10*3/uL (ref 0.0–0.5)
Eosinophils Relative: 6 %
HCT: 34 % — ABNORMAL LOW (ref 36.0–46.0)
Hemoglobin: 11.4 g/dL — ABNORMAL LOW (ref 12.0–15.0)
Immature Granulocytes: 0 %
Lymphocytes Relative: 22 %
Lymphs Abs: 1.2 10*3/uL (ref 0.7–4.0)
MCH: 28.9 pg (ref 26.0–34.0)
MCHC: 33.5 g/dL (ref 30.0–36.0)
MCV: 86.3 fL (ref 80.0–100.0)
Monocytes Absolute: 0.8 10*3/uL (ref 0.1–1.0)
Monocytes Relative: 15 %
Neutro Abs: 2.9 10*3/uL (ref 1.7–7.7)
Neutrophils Relative %: 56 %
Platelet Count: 243 10*3/uL (ref 150–400)
RBC: 3.94 MIL/uL (ref 3.87–5.11)
RDW: 14.5 % (ref 11.5–15.5)
WBC Count: 5.3 10*3/uL (ref 4.0–10.5)
nRBC: 0 % (ref 0.0–0.2)

## 2021-10-25 LAB — CMP (CANCER CENTER ONLY)
ALT: 20 U/L (ref 0–44)
AST: 20 U/L (ref 15–41)
Albumin: 3.9 g/dL (ref 3.5–5.0)
Alkaline Phosphatase: 66 U/L (ref 38–126)
Anion gap: 8 (ref 5–15)
BUN: 13 mg/dL (ref 8–23)
CO2: 20 mmol/L — ABNORMAL LOW (ref 22–32)
Calcium: 8.9 mg/dL (ref 8.9–10.3)
Chloride: 110 mmol/L (ref 98–111)
Creatinine: 0.7 mg/dL (ref 0.44–1.00)
GFR, Estimated: >60 mL/min (ref >60)
Glucose, Bld: 95 mg/dL (ref 70–99)
Potassium: 3.6 mmol/L (ref 3.5–5.1)
Sodium: 138 mmol/L (ref 135–145)
Total Bilirubin: 0.5 mg/dL (ref 0.3–1.2)
Total Protein: 6.3 g/dL — ABNORMAL LOW (ref 6.5–8.1)

## 2021-10-25 LAB — LACTATE DEHYDROGENASE: LDH: 161 U/L (ref 98–192)

## 2021-10-25 NOTE — Progress Notes (Signed)
[place] Telephone:([phone]) [phone]   Fax:(336) [phone]  PROGRESS NOTE  Patient Care Team: [name], MD as PCP - General (Family Medicine) [name], MD as Consulting Physician (Cardiology) [name], MD (Ophthalmology)  Hematological/Oncological History # Lambda Light Chain Multiple Myeloma 12/26/2020: MRI of pelvis showed lytic lesions on L4, L5, the sacrum, with pathologic fracture of left inferior pubic ramus. Concerning for multiple myeloma vs metastatic disease 12/31/2020: establish care with Dr. Lorenso Courier. UPEP showed marked Bence Jones protein, findings concerning for multiple myleoma 01/21/2021: bone marrow biopsy confirms multiple myeloma with atypical plasma cells representing 28% of all cells in the aspirate  02/01/2021: Cycle 1 Day 1 of VRd chemotherapy 02/22/2021: Cycle 2 Day 1 of VRd chemotherapy 03/15/2021: Cycle 3 Day 1 of VRd chemotherapy 04/05/2021: Cycle 4 Day 1 of VRd chemotherapy 04/26/2021: Cycle 5 Day 1 of VRd chemotherapy 05/17/2021: Cycle 6 Day 1 of VRd chemotherapy 06/07/2021:  Cycle 7 Day 1 of VRd chemotherapy 06/28/2021: Cycle 8 Day 1 of VRd chemotherapy 07/19/2021: start maintenance revlimid 75m PO daily   Interval History:  Doris Bolinger75y.o. female with medical history significant for lambda light chain multiple myeloma who presents for a follow up visit. The patient's last visit was on 09/27/2021. In the interim since she hs continued maintenance Revlimid.  On exam today Doris Wilkerson reports she has been experiencing some fatigue while on Revlimid maintenance but otherwise has tolerated it quite well.  She has had no other symptoms that she has noticed.  She developed a small red rash on her chest which has started to resolve.  She does also note occasional "little bumps" on her skin.  She notes that she does not have any overt signs of bleeding, bruising, or dark stools.  Otherwise she has had no major changes in her health.  She does  endorse some occasional bone and back pain but this has been stably improving.  She denies any fevers, chills, sweats, nausea, vomiting or diarrhea. A full 10 point ROS is listed below.   MEDICAL HISTORY:  Past Medical History:  Diagnosis Date   Asthma    Cataract    GERD (gastroesophageal reflux disease)    Hyperlipidemia    Hypertension    Thyroid disease     SURGICAL HISTORY: Past Surgical History:  Procedure Laterality Date   Cataract Surgery     CORONARY ANGIOPLASTY WITH STENT PLACEMENT     EYE SURGERY     ORTHOPEDIC SURGERY  2012   TONSILLECTOMY  1951    SOCIAL HISTORY: Social History   Socioeconomic History   Marital status: Married    Spouse name: Not on file   Number of children: 0   Years of education: Not on file   Highest education level: Not on file  Occupational History   Occupation: unemployed  Tobacco Use   Smoking status: Never   Smokeless tobacco: Never  Vaping Use   Vaping Use: Never used  Substance and Sexual Activity   Alcohol use: Yes    Alcohol/week: 1.0 standard drink    Types: 1 Glasses of wine per week    Comment: occassionally   Drug use: No   Sexual activity: Not Currently  Other Topics Concern   Not on file  Social History Narrative   Married. Education: college. Pt does exercise-   Social Determinants of Health   Financial Resource Strain: Not on file  Food Insecurity: Not on file  Transportation Needs: Not on  file  Physical Activity: Not on file  Stress: Not on file  Social Connections: Not on file  Intimate Partner Violence: Not on file    FAMILY HISTORY: Family History  Problem Relation Age of Onset   Cancer Father    Heart disease Brother    Dementia Brother    Leukemia Brother    Breast cancer Neg Hx    Colon cancer Neg Hx    Esophageal cancer Neg Hx    Pancreatic cancer Neg Hx    Rectal cancer Neg Hx    Stomach cancer Neg Hx     ALLERGIES:  is allergic to poison ivy extract, plavix [clopidogrel  bisulfate], and other.  MEDICATIONS:  Current Outpatient Medications  Medication Sig Dispense Refill   acetaminophen (TYLENOL 8 HOUR ARTHRITIS PAIN) 650 MG CR tablet Take 1 tablet (650 mg total) by mouth every 8 (eight) hours as needed for pain.     aspirin 81 MG tablet Take 81 mg by mouth daily.      atorvastatin (LIPITOR) 80 MG tablet TAKE 1 TABLET(80 MG) BY MOUTH DAILY AT 6 PM 90 tablet 1   benazepril (LOTENSIN) 10 MG tablet TAKE 1 TABLET(10 MG) BY MOUTH DAILY 90 tablet 1   benzonatate (TESSALON PERLES) 100 MG capsule Take 1 capsule (100 mg total) by mouth 3 (three) times daily as needed. 20 capsule 0   calcium carbonate (OS-CAL) 1250 (500 Ca) MG chewable tablet Chew 1 tablet by mouth daily.     cholecalciferol (VITAMIN D3) 25 MCG (1000 UNIT) tablet Take 2,000 Units by mouth daily.     Cyanocobalamin (B-12 PO) Take by mouth daily.     DM-APAP-CPM (CORICIDIN HBP PO) Take by mouth.      famotidine-calcium carbonate-magnesium hydroxide (PEPCID COMPLETE) 10-800-165 MG chewable tablet Chew 1 tablet by mouth daily.     lenalidomide (REVLIMID) 10 MG capsule Take 1 capsule (10 mg total) by mouth daily. [employer] Auth # [id number]   Date Obtained 09/25/21  Take 1 tablet daily for 21 days. None for 7 days 21 capsule 0   levothyroxine (SYNTHROID) 25 MCG tablet TAKE 1 TABLET(25 MCG) BY MOUTH DAILY BEFORE BREAKFAST 90 tablet 0   magic mouthwash (lidocaine, diphenhydrAMINE, alum & mag hydroxide) suspension Swish and spit 5 MLs by mouth every 4 hours as needed for mouth sores 240 mL 0   MAGNESIUM PO Take by mouth daily.     ondansetron (ZOFRAN) 8 MG tablet Take 1 tablet (8 mg total) by mouth every 8 (eight) hours as needed for nausea or vomiting. 30 tablet 0   prochlorperazine (COMPAZINE) 10 MG tablet Take 1 tablet (10 mg total) by mouth every 6 (six) hours as needed for nausea or vomiting. 30 tablet 0   No current facility-administered medications for this visit.    REVIEW OF SYSTEMS:   Constitutional: ( -  ) fevers, ( - )  chills , ( - ) night sweats Eyes: ( - ) blurriness of vision, ( - ) double vision, ( - ) watery eyes Ears, nose, mouth, throat, and face: ( - ) mucositis, ( - ) sore throat Respiratory: ( - ) cough, ( - ) dyspnea, ( - ) wheezes Cardiovascular: ( - ) palpitation, ( - ) chest discomfort, ( - ) lower extremity swelling Gastrointestinal:  ( - ) nausea, ( - ) heartburn, ( - ) change in bowel habits Skin: ( - ) abnormal skin rashes Lymphatics: ( - ) new lymphadenopathy, ( - ) easy bruising Neurological: ( - )  numbness, ( - ) tingling, ( - ) new weaknesses Behavioral/Psych: ( - ) mood change, ( - ) new changes  All other systems were reviewed with the patient and are negative.  PHYSICAL EXAMINATION: ECOG PERFORMANCE STATUS: 1 - Symptomatic but completely ambulatory  Vitals:   10/25/21 1144  BP: 123/60  Pulse: (!) 57  Resp: 17  Temp: 98 F (36.7 C)  SpO2: 100%    Filed Weights   10/25/21 1144  Weight: 143 lb 9 oz (65.1 kg)    GENERAL: well appearing elderly Caucasian female in NAD  SKIN: skin color, texture, turgor are normal, no rashes or significant lesions EYES: conjunctiva are pink and non-injected, sclera clear LUNGS: clear to auscultation and percussion with normal breathing effort HEART: regular rate & rhythm and no murmurs and trace lower extremity edema Musculoskeletal: no cyanosis of digits and no clubbing  PSYCH: alert & oriented x 3, fluent speech NEURO: no focal motor/sensory deficits  LABORATORY DATA:  I have reviewed the data as listed CBC Latest Ref Rng & Units 10/25/2021 09/27/2021 08/16/2021  WBC 4.0 - 10.5 K/uL 5.3 5.0 4.3  Hemoglobin 12.0 - 15.0 g/dL 11.4(L) 12.4 12.7  Hematocrit 36.0 - 46.0 % 34.0(L) 37.0 38.7  Platelets 150 - 400 K/uL 243 260 280    CMP Latest Ref Rng & Units 09/27/2021 08/16/2021 07/19/2021  Glucose 70 - 99 mg/dL 118(H) 88 97  BUN 8 - 23 mg/dL '11 14 16  ' Creatinine 0.44 - 1.00 mg/dL 0.71 0.81 0.79  Sodium 135 - 145 mmol/L  138 140 139  Potassium 3.5 - 5.1 mmol/L 3.8 3.9 3.8  Chloride 98 - 111 mmol/L 108 106 108  CO2 22 - 32 mmol/L '22 24 25  ' Calcium 8.9 - 10.3 mg/dL 9.4 10.0 9.6  Total Protein 6.5 - 8.1 g/dL 6.8 7.1 6.1(L)  Total Bilirubin 0.3 - 1.2 mg/dL 0.7 0.9 0.6  Alkaline Phos 38 - 126 U/L 57 58 61  AST 15 - 41 U/L '21 24 18  ' ALT 0 - 44 U/L '22 21 22    ' Lab Results  Component Value Date   MPROTEIN Not Observed 09/27/2021   MPROTEIN Not Observed 08/16/2021   MPROTEIN Not Observed 06/28/2021   Lab Results  Component Value Date   KPAFRELGTCHN 27.0 (H) 09/27/2021   KPAFRELGTCHN 18.4 08/16/2021   KPAFRELGTCHN 14.5 06/28/2021   LAMBDASER 35.1 (H) 09/27/2021   LAMBDASER 34.2 (H) 08/16/2021   LAMBDASER 25.8 06/28/2021   KAPLAMBRATIO 8.18 09/27/2021   KAPLAMBRATIO 0.77 09/27/2021   KAPLAMBRATIO >4.87 08/16/2021    RADIOGRAPHIC STUDIES: No results found.  ASSESSMENT & PLAN Doris Wilkerson 75 y.o. female with medical history significant for lambda light chain multiple myeloma who presents for a follow up visit.   After review the labs, review the records, schedule the patient the findings most consistent with a lambda light chain multiple myeloma.  This is getting confirmed with a bone marrow biopsy showing an abnormal plasma cell population of 28% and M protein/Bence-Jones proteins in the urine.  Given these findings the patient now carries a diagnosis of lambda light chain multiple myeloma.  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  This consists of bortezomib 1.3 mg per metered squared q week, lenalidomide 25 mg p.o. x14 days of 21-day cycle, and dexamethasone 40 mg p.o. q. weekly.  This will be continued until he reached a VGPR/ complete 8 cycles of triplet therapy, at which time we can consider transition over  to maintenance Revlimid 10 mg p.o. daily 21 of 28 days per cycle.  Doris Wilkerson is not a candidate for bone marrow transplant based on her advanced age.  She  transition to maintenance Revlimid on 07/19/2021.  # Lambda Light Chain Multiple Myeloma--VGPR on maintenance --diagnosis confirmed with bone marrow biopsy and urine protein analysis.   --patient has good functional status and would be an excellent candidate for VRd chemotherapy. I do not believe she would be a bone marrow transplant candidate based on her age.  --assure monthly restaging labs with SPEP, UPEP, and SFLC --patient declines port placement at this time --Cycle 1 Day 1 of therapy started on 02/01/2021.  --she completed Cycle 8 Day 1 of Vrd, started maintenance Revlimid on 07/19/2021. --excellent response noted within 1st cycle, normalization of urine free light chains and undetectable M protein.  Plan: -- Continue maintenance Revlimid 10 mg p.o. daily with 21 days on and 7 days off --Monthly restaging labs with serum free light chains, UPEP, and SPEP --Return to clinic every 4 weeks  #Supportive Care --chemotherapy education complete  --zofran 49m q8H PRN and compazine 126mPO q6H for nausea --acyclovir can be d/c --zometa therapy performed on 09/27/2021. Repeat q 3 months.  -- no pain medication required at this time.   No orders of the defined types were placed in this encounter.  All questions were answered. The patient knows to call the clinic with any problems, questions or concerns.  A total of more than 30 minutes were spent on this encounter and over half of that time was spent on counseling and coordination of care as outlined above.   JoLedell PeoplesMD Department of Hematology/Oncology CoPort Alexandert WeCherry County Hospitalhone: [phone]: [phone]: [email or web address]  10/25/2021 12:03 PM   Literature Support:  1) SuBennett Scrapeortezomib, lenalidomide, and dexamethasone in transplant-eligible newly diagnosed  multiple myeloma patients: a multicenter retrospective comparative analysis. Int J Hinton Dyer2020 Jan;111(1):103-111. doi: 10.1007/s12185-019-02764-1.  -- Overall response, very good partial response, and complete response rates after VRD were 96.4%, 45.5%, and 20.0%, respectively (median follow-up period, 17.7 months). The 1-year progression-free survival (PFS) and overall survival rates were 95.8% and 98.2%, respectively. The response rate and PFS were similar between the groups, regardless of cytogenetic risk and age.  2) JaCasandra DoffingDa9[address] West Prince DriveE, Pawlyn C,Loletha GrayerCaPlattsvilleStDover Base HousingCoGlendale HeightsHockaday A, Jones JR, Kishore B, Garg M, Williams CD, Karunanithi K,Delaine LameW, CoLaban EmperorH, KaVesperDrayson MT, OwRobinette HainesM, MoMyra RudeUKVenezuelaCRI [facility]-oncology Clinical Studies Group. Lenalidomide maintenance versus observation for patients with newly diagnosed multiple myeloma (Myeloma XI): a multicentre, open-label, randomised, phase 3 trial. Lancet Oncol. 2019 Jan;20(1):57-73. doi: 10.1016/S1470-2045(18)30687-9. Epub 2018 Dec 14.   --Maintenance therapy with lenalidomide significantly improved progression-free survival in patients with newly diagnosed multiple myeloma compared with observation   --After a median follow-up of 31 months (IQR 18-50), median progression-free survival was 39 months (95% CI 36-42) with lenalidomide and 20 months (18-22) with observation (hazard ratio [HR] 046 [95% CI 041-053]; p<00001), and 3-year overall survival was 786% (95% Cl 756-816) in the lenalidomide group and 758% (7(585-277in the observation group (HR 087 [95% CI 073-105]; p=015). Progression-free survival was improved with lenalidomide compared with observation across all prespecified subgroups.

## 2021-10-28 LAB — UPEP/UIFE/LIGHT CHAINS/TP, 24-HR UR
% BETA, Urine: 0 %
ALPHA 1 URINE: 0 %
Albumin, U: 100 %
Alpha 2, Urine: 0 %
Free Kappa Lt Chains,Ur: 52.9 mg/L (ref 1.17–86.46)
Free Kappa/Lambda Ratio: 9.4 (ref 1.83–14.26)
Free Lambda Lt Chains,Ur: 5.63 mg/L (ref 0.27–15.21)
GAMMA GLOBULIN URINE: 0 %
Total Protein, Urine-Ur/day: 138 mg/24 hr (ref 30–150)
Total Protein, Urine: 6.4 mg/dL
Total Volume: 2150

## 2021-10-28 LAB — KAPPA/LAMBDA LIGHT CHAINS
Kappa free light chain: 26.4 mg/L — ABNORMAL HIGH (ref 3.3–19.4)
Kappa, lambda light chain ratio: 0.82 (ref 0.26–1.65)
Lambda free light chains: 32.3 mg/L — ABNORMAL HIGH (ref 5.7–26.3)

## 2021-10-30 LAB — MULTIPLE MYELOMA PANEL, SERUM
Albumin SerPl Elph-Mcnc: 3.7 g/dL (ref 2.9–4.4)
Albumin/Glob SerPl: 1.7 (ref 0.7–1.7)
Alpha 1: 0.2 g/dL (ref 0.0–0.4)
Alpha2 Glob SerPl Elph-Mcnc: 0.6 g/dL (ref 0.4–1.0)
B-Globulin SerPl Elph-Mcnc: 0.8 g/dL (ref 0.7–1.3)
Gamma Glob SerPl Elph-Mcnc: 0.6 g/dL (ref 0.4–1.8)
Globulin, Total: 2.2 g/dL (ref 2.2–3.9)
IgA: 138 mg/dL (ref 64–422)
IgG (Immunoglobin G), Serum: 705 mg/dL (ref 586–1602)
IgM (Immunoglobulin M), Srm: 20 mg/dL — ABNORMAL LOW (ref 26–217)
Total Protein ELP: 5.9 g/dL — ABNORMAL LOW (ref 6.0–8.5)

## 2021-10-31 ENCOUNTER — Other Ambulatory Visit: Payer: Self-pay | Admitting: *Deleted

## 2021-10-31 MED ORDER — LENALIDOMIDE 10 MG PO CAPS: 10.0000 mg | ORAL_CAPSULE | Freq: Every day | ORAL | 0 refills | Status: DC

## 2021-11-02 ENCOUNTER — Other Ambulatory Visit: Payer: Self-pay | Admitting: Hematology and Oncology

## 2021-11-06 ENCOUNTER — Encounter: Payer: Self-pay | Admitting: Hematology and Oncology

## 2021-11-08 ENCOUNTER — Encounter: Payer: Self-pay | Admitting: Hematology and Oncology

## 2021-11-13 ENCOUNTER — Other Ambulatory Visit: Payer: Self-pay | Admitting: Family Medicine

## 2021-11-13 DIAGNOSIS — E782 Mixed hyperlipidemia: Secondary | ICD-10-CM

## 2021-11-13 DIAGNOSIS — I10 Essential (primary) hypertension: Secondary | ICD-10-CM

## 2021-11-20 ENCOUNTER — Telehealth: Payer: Self-pay | Admitting: *Deleted

## 2021-11-20 ENCOUNTER — Other Ambulatory Visit (HOSPITAL_COMMUNITY): Payer: Self-pay

## 2021-11-20 ENCOUNTER — Other Ambulatory Visit: Payer: Self-pay | Admitting: Family Medicine

## 2021-11-20 DIAGNOSIS — E782 Mixed hyperlipidemia: Secondary | ICD-10-CM

## 2021-11-20 NOTE — Telephone Encounter (Signed)
Received vm message from pt. She states that [facility] will no longer be her specialty pharmacy as of 11/24/21.  She is not sure what to do about her Revlimid shipping and cost and if there are other options. She is requesting a call back.  Please advise

## 2021-11-21 ENCOUNTER — Ambulatory Visit (INDEPENDENT_AMBULATORY_CARE_PROVIDER_SITE_OTHER): Payer: Medicare Other | Admitting: Family Medicine

## 2021-11-21 ENCOUNTER — Encounter: Payer: Self-pay | Admitting: Hematology and Oncology

## 2021-11-21 VITALS — BP 116/62 | HR 74 | Temp 97.6°F | Resp 17 | Ht 66.0 in | Wt 143.2 lb

## 2021-11-21 DIAGNOSIS — E039 Hypothyroidism, unspecified: Secondary | ICD-10-CM

## 2021-11-21 DIAGNOSIS — Z13228 Encounter for screening for other metabolic disorders: Secondary | ICD-10-CM

## 2021-11-21 DIAGNOSIS — Z1329 Encounter for screening for other suspected endocrine disorder: Secondary | ICD-10-CM

## 2021-11-21 DIAGNOSIS — G6289 Other specified polyneuropathies: Secondary | ICD-10-CM

## 2021-11-21 DIAGNOSIS — E782 Mixed hyperlipidemia: Secondary | ICD-10-CM

## 2021-11-21 DIAGNOSIS — I2510 Atherosclerotic heart disease of native coronary artery without angina pectoris: Secondary | ICD-10-CM | POA: Diagnosis not present

## 2021-11-21 DIAGNOSIS — I10 Essential (primary) hypertension: Secondary | ICD-10-CM

## 2021-11-21 DIAGNOSIS — C9000 Multiple myeloma not having achieved remission: Secondary | ICD-10-CM | POA: Diagnosis not present

## 2021-11-21 DIAGNOSIS — Z130 Encounter for screening for diseases of the blood and blood-forming organs and certain disorders involving the immune mechanism: Secondary | ICD-10-CM

## 2021-11-21 MED ORDER — GABAPENTIN 100 MG PO CAPS: 100.0000 mg | ORAL_CAPSULE | Freq: Every day | ORAL | 1 refills | Status: DC

## 2021-11-21 NOTE — Progress Notes (Signed)
Subjective:  Patient ID: Doris Wilkerson, female    DOB: 1946/08/03  Age: 75 y.o. MRN: [id number]  CC:  Chief Complaint  Patient presents with   Hypothyroidism    Pt reports doing okay no questions today    Peripheral Neuropathy    Pt reports her neuropathy in her feet has worsened since starting chemotherapy     HPI Doris Wilkerson presents for   Multiple myeloma Lambda light chain multiple myeloma. status post chemotherapy, then started on maintenance Revlimid in August.  Followed by Dr. Lorenso Courier.  On a drug holiday over the holidays.  Note reviewed from her hematologist, disease under excellent control and okay with a few week off medication due to some of the side effects.  Appointment with hematology tomorrow. Has experienced some peripheral neuropathic symptoms with neuropathic symptoms in her feet since starting chemo. Noted since August/September. Some tingling/burning occasionally, usually numbness. Getting worse.  No low back pain currently. Feels more symptoms in morning. - balance slightly off, no falls.  No leg weakness, no incontinence.  No results found for: VITAMINB12  Hypothyroidism: Lab Results  Component Value Date   TSH 0.735 04/26/2021  Synthroid 25 mcg daily Taking medication daily.  No new hot or cold intolerance. No new hair or skin changes, heart palpitations or new fatigue - only changes thought to be due to revlimid.  No new weight changes.   Hypertension: Lisinopril 10 mg daily, no new side effects.  Home readings:none.  BP Readings from Last 3 Encounters:  11/21/21 116/62  10/25/21 123/60  09/27/21 133/73   Lab Results  Component Value Date   CREATININE 0.70 10/25/2021   Hyperlipidemia: With history of CAD, cardiologist Dr. Einar Gip.  Lipitor 80 mg daily.  No new myalgias. No new CP/DOE.  Lab Results  Component Value Date   CHOL 163 05/03/2021   HDL 78 05/03/2021   LDLCALC 69 05/03/2021   TRIG 85 05/03/2021   CHOLHDL 2.1 05/03/2021    Lab Results  Component Value Date   ALT 20 10/25/2021   AST 20 10/25/2021   ALKPHOS 66 10/25/2021   BILITOT 0.5 10/25/2021        History Patient Active Problem List   Diagnosis Date Noted   Physical debility 03/21/2021   Dizziness 03/21/2021   Vertigo 03/21/2021   Anemia due to antineoplastic chemotherapy 03/21/2021   Skin changes related to chemotherapy 03/21/2021   Other constipation 03/21/2021   Multiple myeloma not having achieved remission ([place]) 01/27/2021   Hordeolum externum of left lower eyelid 06/28/2018   CAD (coronary artery disease) 12/09/2011   Dyslipidemia 12/09/2011   Osteopenia 12/09/2011   Hypothyroid 12/09/2011   Asthma 12/09/2011   Past Medical History:  Diagnosis Date   Asthma    Cataract    GERD (gastroesophageal reflux disease)    Hyperlipidemia    Hypertension    Thyroid disease    Past Surgical History:  Procedure Laterality Date   Cataract Surgery     CORONARY ANGIOPLASTY WITH STENT PLACEMENT     EYE SURGERY     ORTHOPEDIC SURGERY  2012   TONSILLECTOMY  1951   Allergies  Allergen Reactions   Poison Ivy Extract Swelling   Plavix [Clopidogrel Bisulfate]    Other     seasonal   Prior to Admission medications   Medication Sig Start Date End Date Taking? Authorizing Provider  acetaminophen (TYLENOL 8 HOUR ARTHRITIS PAIN) 650 MG CR tablet Take 1 tablet (650 mg total) by mouth every  8 (eight) hours as needed for pain. 01/03/21  Yes Orson Slick, MD  aspirin 81 MG tablet Take 81 mg by mouth daily.    Yes Hayden Rasmussen, MD  atorvastatin (LIPITOR) 80 MG tablet TAKE 1 TABLET(80 MG) BY MOUTH DAILY AT 6 PM 11/20/21  Yes [name], MD  benazepril (LOTENSIN) 10 MG tablet TAKE 1 TABLET(10 MG) BY MOUTH DAILY 11/14/21  Yes [name], MD  calcium carbonate (OS-CAL) 1250 (500 Ca) MG chewable tablet Chew 1 tablet by mouth daily.   Yes [provider]  cholecalciferol (VITAMIN D3) 25 MCG (1000 UNIT) tablet Take 2,000  Units by mouth daily.   Yes [provider]  Cyanocobalamin (B-12 PO) Take by mouth daily.   Yes [provider]  DM-APAP-CPM (CORICIDIN HBP PO) Take by mouth.    Yes [provider]  famotidine-calcium carbonate-magnesium hydroxide (PEPCID COMPLETE) 10-800-165 MG chewable tablet Chew 1 tablet by mouth daily.   Yes [provider]  lenalidomide (REVLIMID) 10 MG capsule Take 1 capsule (10 mg total) by mouth daily. Fanny Dance # [id number]   Date Obtained 112/8/22  Take 1 tablet daily for 21 days. None for 7 days 10/31/21  Yes Orson Slick, MD  levothyroxine (SYNTHROID) 25 MCG tablet TAKE 1 TABLET(25 MCG) BY MOUTH DAILY BEFORE BREAKFAST 09/24/21  Yes [name], MD  magic mouthwash (lidocaine, diphenhydrAMINE, alum & mag hydroxide) suspension Swish and spit 5 MLs by mouth every 4 hours as needed for mouth sores 08/27/21  Yes Orson Slick, MD  MAGNESIUM PO Take by mouth daily.   Yes [provider]  ondansetron (ZOFRAN) 8 MG tablet Take 1 tablet (8 mg total) by mouth every 8 (eight) hours as needed for nausea or vomiting. 01/30/21  Yes Orson Slick, MD  prochlorperazine (COMPAZINE) 10 MG tablet Take 1 tablet (10 mg total) by mouth every 6 (six) hours as needed for nausea or vomiting. 01/30/21  Yes Orson Slick, MD   Social History   Socioeconomic History   Marital status: Married    Spouse name: Not on file   Number of children: 0   Years of education: Not on file   Highest education level: Not on file  Occupational History   Occupation: unemployed  Tobacco Use   Smoking status: Never   Smokeless tobacco: Never  Vaping Use   Vaping Use: Never used  Substance and Sexual Activity   Alcohol use: Yes    Alcohol/week: 1.0 standard drink    Types: 1 Glasses of wine per week    Comment: occassionally   Drug use: No   Sexual activity: Not Currently  Other Topics Concern   Not on file  Social History Narrative   Married.  Education: college. Pt does exercise-   Social Determinants of Health   Financial Resource Strain: Not on file  Food Insecurity: Not on file  Transportation Needs: Not on file  Physical Activity: Not on file  Stress: Not on file  Social Connections: Not on file  Intimate Partner Violence: Not on file    Review of Systems  Constitutional:  Negative for fatigue (no new sx's.) and unexpected weight change.  Respiratory:  Negative for chest tightness and shortness of breath.   Cardiovascular:  Negative for chest pain, palpitations and leg swelling.  Gastrointestinal:  Negative for abdominal pain and blood in stool.  Neurological:  Negative for dizziness, syncope, light-headedness and headaches.  Objective:   Vitals:   11/21/21 1000  BP: 116/62  Pulse: 74  Resp: 17  Temp: 97.6 F (36.4 C)  TempSrc: Temporal  SpO2: 98%  Weight: 143 lb 3.2 oz (65 kg)  Height: '5\' 6"'  (1.676 m)     Physical Exam Vitals reviewed.  Constitutional:      Appearance: Normal appearance. She is well-developed.  HENT:     Head: Normocephalic and atraumatic.  Eyes:     Conjunctiva/sclera: Conjunctivae normal.     Pupils: Pupils are equal, round, and reactive to light.  Neck:     Vascular: No carotid bruit.  Cardiovascular:     Rate and Rhythm: Normal rate and regular rhythm.     Heart sounds: Normal heart sounds.  Pulmonary:     Effort: Pulmonary effort is normal.     Breath sounds: Normal breath sounds.  Abdominal:     Palpations: Abdomen is soft. There is no pulsatile mass.     Tenderness: There is no abdominal tenderness.  Musculoskeletal:     Right lower leg: No edema.     Left lower leg: No edema.  Skin:    General: Skin is warm and dry.  Neurological:     Mental Status: She is alert and oriented to person, place, and time.  Psychiatric:        Mood and Affect: Mood normal.        Behavior: Behavior normal.       Assessment & Plan:  Brigette Hopfer is a 75 y.o. female  . Hypothyroidism, unspecified type - Plan: TSH  - Stable, tolerating current regimen. Medications refilled. Labs pending as above.   Multiple myeloma not having achieved remission ([facility]) Other polyneuropathy - Plan: gabapentin (NEURONTIN) 100 MG capsule, B12  -New concern with neuropathy, could be related to her previous chemo treatments.  Trial of gabapentin, low-dose with option to increase dosing.  Can discuss at follow-up visit with hematology.  Mixed hyperlipidemia - Plan: Lipid panel  -Tolerating current dose of statin, continue same.  Check lipids recent CMP noted.  Essential hypertension  -Stable, no med changes at this time.  Screening for endocrine, metabolic and immunity disorder - Plan: B12  -Check B12 with peripheral neuropathic symptoms.  Meds ordered this encounter  Medications   gabapentin (NEURONTIN) 100 MG capsule    Sig: Take 1 capsule (100 mg total) by mouth at bedtime.    Dispense:  90 capsule    Refill:  1   Patient Instructions  Gabapentin initially 192m at bedtime for neuropathy, but we have options to increase dose and frequency of needed. Discuss at your upcoming appointment with Dr. DLorenso Courier If worsening or affecting balance or coordination be seen right away. Recheck in next 6 weeks to discuss other testing/eval or possible neuro eval.  [facility] not able to be drawn at your other visit tomorrow.          Signed,   JMerri Ray MD [facility] 11/21/21 11:00 AM

## 2021-11-21 NOTE — Patient Instructions (Addendum)
Gabapentin initially 100mg  at bedtime for neuropathy, but we have options to increase dose and frequency of needed. Discuss at your upcoming appointment with Dr. Lorenso Courier. If worsening or affecting balance or coordination be seen right away. Recheck in next 6 weeks to discuss other testing/eval or possible neuro eval.  Have labs done at [facility] lab if not able to be drawn at your other visit tomorrow.

## 2021-11-22 ENCOUNTER — Inpatient Hospital Stay: Payer: Medicare Other

## 2021-11-22 ENCOUNTER — Other Ambulatory Visit: Payer: Self-pay

## 2021-11-22 ENCOUNTER — Inpatient Hospital Stay (HOSPITAL_BASED_OUTPATIENT_CLINIC_OR_DEPARTMENT_OTHER): Payer: Medicare Other | Admitting: Physician Assistant

## 2021-11-22 ENCOUNTER — Encounter: Payer: Self-pay | Admitting: *Deleted

## 2021-11-22 VITALS — BP 138/85 | HR 81 | Temp 98.8°F | Resp 18 | Wt 142.8 lb

## 2021-11-22 DIAGNOSIS — Z9221 Personal history of antineoplastic chemotherapy: Secondary | ICD-10-CM | POA: Diagnosis not present

## 2021-11-22 DIAGNOSIS — C9000 Multiple myeloma not having achieved remission: Secondary | ICD-10-CM

## 2021-11-22 DIAGNOSIS — Z79899 Other long term (current) drug therapy: Secondary | ICD-10-CM | POA: Diagnosis not present

## 2021-11-22 DIAGNOSIS — Z7982 Long term (current) use of aspirin: Secondary | ICD-10-CM | POA: Diagnosis not present

## 2021-11-22 LAB — CBC WITH DIFFERENTIAL (CANCER CENTER ONLY)
Abs Immature Granulocytes: 0.01 10*3/uL (ref 0.00–0.07)
Basophils Absolute: 0.1 10*3/uL (ref 0.0–0.1)
Basophils Relative: 1 %
Eosinophils Absolute: 0.2 10*3/uL (ref 0.0–0.5)
Eosinophils Relative: 4 %
HCT: 37 % (ref 36.0–46.0)
Hemoglobin: 12.2 g/dL (ref 12.0–15.0)
Immature Granulocytes: 0 %
Lymphocytes Relative: 32 %
Lymphs Abs: 1.7 10*3/uL (ref 0.7–4.0)
MCH: 28.4 pg (ref 26.0–34.0)
MCHC: 33 g/dL (ref 30.0–36.0)
MCV: 86.2 fL (ref 80.0–100.0)
Monocytes Absolute: 0.5 10*3/uL (ref 0.1–1.0)
Monocytes Relative: 10 %
Neutro Abs: 2.8 10*3/uL (ref 1.7–7.7)
Neutrophils Relative %: 53 %
Platelet Count: 324 10*3/uL (ref 150–400)
RBC: 4.29 MIL/uL (ref 3.87–5.11)
RDW: 14.9 % (ref 11.5–15.5)
WBC Count: 5.2 10*3/uL (ref 4.0–10.5)
nRBC: 0 % (ref 0.0–0.2)

## 2021-11-22 LAB — CMP (CANCER CENTER ONLY)
ALT: 16 U/L (ref 0–44)
AST: 19 U/L (ref 15–41)
Albumin: 4.3 g/dL (ref 3.5–5.0)
Alkaline Phosphatase: 61 U/L (ref 38–126)
Anion gap: 6 (ref 5–15)
BUN: 15 mg/dL (ref 8–23)
CO2: 24 mmol/L (ref 22–32)
Calcium: 10 mg/dL (ref 8.9–10.3)
Chloride: 107 mmol/L (ref 98–111)
Creatinine: 0.79 mg/dL (ref 0.44–1.00)
GFR, Estimated: >60 mL/min (ref >60)
Glucose, Bld: 91 mg/dL (ref 70–99)
Potassium: 4 mmol/L (ref 3.5–5.1)
Sodium: 137 mmol/L (ref 135–145)
Total Bilirubin: 0.4 mg/dL (ref 0.3–1.2)
Total Protein: 6.9 g/dL (ref 6.5–8.1)

## 2021-11-22 LAB — LACTATE DEHYDROGENASE: LDH: 153 U/L (ref 98–192)

## 2021-11-23 ENCOUNTER — Encounter: Payer: Self-pay | Admitting: Family Medicine

## 2021-11-23 MED ORDER — LEVOTHYROXINE SODIUM 25 MCG PO TABS
ORAL_TABLET | ORAL | 3 refills | Status: DC
Start: 2021-11-23 — End: 2022-07-03

## 2021-11-25 ENCOUNTER — Encounter: Payer: Self-pay | Admitting: Hematology and Oncology

## 2021-11-25 NOTE — Progress Notes (Signed)
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 Concerning for multiple myeloma vs metastatic disease 12/31/2020: establish care with Dr. Lorenso Courier. UPEP showed marked Bence Jones protein, findings concerning for multiple myleoma 01/21/2021: bone marrow biopsy confirms multiple myeloma with atypical plasma cells representing 28% of all cells in the aspirate  02/01/2021: Cycle 1 Day 1 of VRd chemotherapy 02/22/2021: Cycle 2 Day 1 of VRd chemotherapy 03/15/2021: Cycle 3 Day 1 of VRd chemotherapy 04/05/2021: Cycle 4 Day 1 of VRd chemotherapy 04/26/2021: Cycle 5 Day 1 of VRd chemotherapy 05/17/2021: Cycle 6 Day 1 of VRd chemotherapy 06/07/2021:  Cycle 7 Day 1 of VRd chemotherapy 06/28/2021: Cycle 8 Day 1 of VRd chemotherapy 07/19/2021: start maintenance revlimid 97m PO daily. Started break from Revlimid starting 11/08/2021.    Interval History:  LZarrah Loveland89y.o. female with medical history significant for lambda light chain multiple myeloma who presents for a follow up visit. The patient's last visit was on 10/25/2021. In the interim since she has started a treatment break over the holidays.   On exam today Mrs. Piccirilli reports that since she has been on a treatment break, her appetite has improved. Her energy levels have improved as well and she continues to complete her daily activities independently. She denies nausea, vomiting or abdominal. Her bowel habits have slowed down where she has once bowel movement per day. She denies easy bruising or signs of bleeding. Patient has left posterior rib pain that has  been present for the last 4-5 months. She does not require any pain medication at this time. 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 She denies fevers, chills, night sweats, shortness of breath, chest pain or cough. She hsa no other complaints. A full 10 point ROS is listed below.   MEDICAL HISTORY:  Past Medical History:  Diagnosis Date   Asthma    Cataract    GERD (gastroesophageal reflux disease)    Hyperlipidemia    Hypertension    Thyroid disease     SURGICAL HISTORY: Past Surgical History:  Procedure Laterality Date   Cataract Surgery     CORONARY ANGIOPLASTY WITH STENT PLACEMENT     EYE SURGERY     ORTHOPEDIC SURGERY  2012   TONSILLECTOMY  1951    SOCIAL HISTORY: Social History   Socioeconomic History   Marital status: Married    Spouse name: Not on file   Number of children: 0   Years of education: Not on file   Highest education level: Not on file  Occupational History   Occupation: unemployed  Tobacco Use   Smoking status: Never   Smokeless tobacco: Never  Vaping Use   Vaping Use: Never used  Substance and Sexual Activity   Alcohol use: Yes    Alcohol/week: 1.0 standard drink    Types: 1 Glasses of wine per week    Comment: occassionally   Drug use: No   Sexual activity: Not Currently  Other Topics Concern   Not on file  Social History Narrative   Married. Education: college. Pt does exercise-   Social Determinants of Health   Financial  Resource Strain: Not on file  Food Insecurity: Not on file  Transportation Needs: Not on file  Physical Activity: Not on file  Stress: Not on file  Social Connections: Not on file  Intimate Partner Violence: Not on file    FAMILY HISTORY: Family History  Problem Relation Age of Onset   Cancer Father    Heart disease Brother    Dementia Brother    Leukemia Brother    Breast cancer Neg Hx    Colon cancer Neg Hx    Esophageal cancer Neg Hx    Pancreatic cancer Neg Hx     Rectal cancer Neg Hx    Stomach cancer Neg Hx     ALLERGIES:  is allergic to poison ivy extract, plavix [clopidogrel bisulfate], and other.  MEDICATIONS:  Current Outpatient Medications  Medication Sig Dispense Refill   acetaminophen (TYLENOL 8 HOUR ARTHRITIS PAIN) 650 MG CR tablet Take 1 tablet (650 mg total) by mouth every 8 (eight) hours as needed for pain.     aspirin 81 MG tablet Take 81 mg by mouth daily.      atorvastatin (LIPITOR) 80 MG tablet TAKE 1 TABLET(80 MG) BY MOUTH DAILY AT 6 PM 90 tablet 1   benazepril (LOTENSIN) 10 MG tablet TAKE 1 TABLET(10 MG) BY MOUTH DAILY 90 tablet 1   calcium carbonate (OS-CAL) 1250 (500 Ca) MG chewable tablet Chew 1 tablet by mouth daily.     cholecalciferol (VITAMIN D3) 25 MCG (1000 UNIT) tablet Take 2,000 Units by mouth daily.     Cyanocobalamin (B-12 PO) Take by mouth daily.     DM-APAP-CPM (CORICIDIN HBP PO) Take by mouth.      famotidine-calcium carbonate-magnesium hydroxide (PEPCID COMPLETE) 10-800-165 MG chewable tablet Chew 1 tablet by mouth daily.     gabapentin (NEURONTIN) 100 MG capsule Take 1 capsule (100 mg total) by mouth at bedtime. 90 capsule 1   magic mouthwash (lidocaine, diphenhydrAMINE, alum & mag hydroxide) suspension Swish and spit 5 MLs by mouth every 4 hours as needed for mouth sores 240 mL 0   MAGNESIUM PO Take by mouth daily.     ondansetron (ZOFRAN) 8 MG tablet Take 1 tablet (8 mg total) by mouth every 8 (eight) hours as needed for nausea or vomiting. 30 tablet 0   prochlorperazine (COMPAZINE) 10 MG tablet Take 1 tablet (10 mg total) by mouth every 6 (six) hours as needed for nausea or vomiting. 30 tablet 0   lenalidomide (REVLIMID) 10 MG capsule Take 1 capsule (10 mg total) by mouth daily. Fanny Dance # [id number]   Date Obtained 112/8/22  Take 1 tablet daily for 21 days. None for 7 days (Patient not taking: Reported on 11/22/2021) 21 capsule 0   levothyroxine (SYNTHROID) 25 MCG tablet TAKE 1 TABLET(25 MCG) BY MOUTH DAILY  BEFORE BREAKFAST 90 tablet 3   No current facility-administered medications for this visit.    REVIEW OF SYSTEMS:   Constitutional: ( - ) fevers, ( - )  chills , ( - ) night sweats Eyes: ( - ) blurriness of vision, ( - ) double vision, ( - ) watery eyes Ears, nose, mouth, throat, and face: ( - ) mucositis, ( - ) sore throat Respiratory: ( - ) cough, ( - ) dyspnea, ( - ) wheezes Cardiovascular: ( - ) palpitation, ( - ) chest discomfort, ( - ) lower extremity swelling Gastrointestinal:  ( - ) nausea, ( - ) heartburn, ( - ) change in bowel habits Skin: ( - )  abnormal skin rashes Lymphatics: ( - ) new lymphadenopathy, ( - ) easy bruising Neurological: ( +) numbness, ( - ) tingling, ( - ) new weaknesses Behavioral/Psych: ( - ) mood change, ( - ) new changes  All other systems were reviewed with the patient and are negative.  PHYSICAL EXAMINATION: ECOG PERFORMANCE STATUS: 1 - Symptomatic but completely ambulatory  Vitals:   11/22/21 1039  BP: 138/85  Pulse: 81  Resp: 18  Temp: 98.8 F (37.1 C)  SpO2: 100%    Filed Weights   11/22/21 1039  Weight: 142 lb 12.8 oz (64.8 kg)    GENERAL: well appearing elderly Caucasian female in NAD  SKIN: skin color, texture, turgor are normal, no rashes or significant lesions EYES: conjunctiva are pink and non-injected, sclera clear LUNGS: clear to auscultation and percussion with normal breathing effort HEART: regular rate & rhythm and no murmurs and trace lower extremity edema Musculoskeletal: no cyanosis of digits and no clubbing  PSYCH: alert & oriented x 3, fluent speech NEURO: no focal motor/sensory deficits  LABORATORY DATA:  I have reviewed the data as listed CBC Latest Ref Rng & Units 11/22/2021 10/25/2021 09/27/2021  WBC 4.0 - 10.5 K/uL 5.2 5.3 5.0  Hemoglobin 12.0 - 15.0 g/dL 12.2 11.4(L) 12.4  Hematocrit 36.0 - 46.0 % 37.0 34.0(L) 37.0  Platelets 150 - 400 K/uL 324 243 260    CMP Latest Ref Rng & Units 11/22/2021 10/25/2021  09/27/2021  Glucose 70 - 99 mg/dL 91 95 118(H)  BUN 8 - 23 mg/dL '15 13 11  ' Creatinine 0.44 - 1.00 mg/dL 0.79 0.70 0.71  Sodium 135 - 145 mmol/L 137 138 138  Potassium 3.5 - 5.1 mmol/L 4.0 3.6 3.8  Chloride 98 - 111 mmol/L 107 110 108  CO2 22 - 32 mmol/L 24 20(L) 22  Calcium 8.9 - 10.3 mg/dL 10.0 8.9 9.4  Total Protein 6.5 - 8.1 g/dL 6.9 6.3(L) 6.8  Total Bilirubin 0.3 - 1.2 mg/dL 0.4 0.5 0.7  Alkaline Phos 38 - 126 U/L 61 66 57  AST 15 - 41 U/L '19 20 21  ' ALT 0 - 44 U/L '16 20 22    ' Lab Results  Component Value Date   MPROTEIN Not Observed 10/25/2021   MPROTEIN Not Observed 09/27/2021   MPROTEIN Not Observed 08/16/2021   Lab Results  Component Value Date   KPAFRELGTCHN 26.4 (H) 10/25/2021   KPAFRELGTCHN 27.0 (H) 09/27/2021   KPAFRELGTCHN 18.4 08/16/2021   LAMBDASER 32.3 (H) 10/25/2021   LAMBDASER 35.1 (H) 09/27/2021   LAMBDASER 34.2 (H) 08/16/2021   KAPLAMBRATIO 0.82 10/25/2021   KAPLAMBRATIO 9.40 10/24/2021   KAPLAMBRATIO 8.18 09/27/2021    RADIOGRAPHIC STUDIES: No results found.  ASSESSMENT & PLAN Aarushi Hemric 76 y.o. female with medical history significant for lambda light chain multiple myeloma who presents for a follow up visit.   After review the labs, review the records, schedule the patient the findings most consistent with a lambda light chain multiple myeloma.  This is getting confirmed with a bone marrow biopsy showing an abnormal plasma cell population of 28% and M protein/Bence-Jones proteins in the urine.  Given these findings the patient now carries a diagnosis of lambda light chain multiple myeloma.  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  This consists of bortezomib 1.3 mg per metered squared q week, lenalidomide 25 mg p.o. x14 days of 21-day cycle, and dexamethasone 40 mg p.o. q. weekly.  This will be continued until  he reached a VGPR/ complete 8 cycles of triplet therapy, at which time we can consider transition  over to maintenance Revlimid 10 mg p.o. daily 21 of 28 days per cycle.  Ms. Mcphail is not a candidate for bone marrow transplant based on her advanced age.  She transition to maintenance Revlimid on 07/19/2021.  # Lambda Light Chain Multiple Myeloma--VGPR on maintenance --diagnosis confirmed with bone marrow biopsy and urine protein analysis.   --patient has good functional status and would be an excellent candidate for VRd chemotherapy. I do not believe she would be a bone marrow transplant candidate based on her age.  --assure monthly restaging labs with SPEP, UPEP, and SFLC --patient declines port placement at this time --Cycle 1 Day 1 of therapy started on 02/01/2021.  --she completed Cycle 8 Day 1 of Vrd, started maintenance Revlimid on 07/19/2021. --Labs from today were reviewed and require no intervention. SPEP and sFLC pending.  --Revlimid has been on hold since 11/08/2021 due to toxicities (appetite loss, fatigue) and insurance denial of medication.  --We will write an appeal letter to [facility] and send another prescription to [facility].   Plan: -- Continue on treatment break of Revlimid and plan to restart on 12/20/2021 --Monthly restaging labs with serum free light chains, UPEP, and SPEP --Return to clinic every 4 weeks  #Supportive Care --chemotherapy education complete  --zofran 63m q8H PRN and compazine 19mPO q6H for nausea --acyclovir can be d/c --zometa therapy performed on 09/27/2021. Repeat q 3 months.  -- no pain medication required at this time.   No orders of the defined types were placed in this encounter.   All questions were answered. The patient knows to call the clinic with any problems, questions or concerns.  I have spent a total of 25 minutes minutes of face-to-face and non-face-to-face time, preparing to see the patient, performing a medically appropriate examination, counseling and educating the patient, documenting clinical information in the electronic health  record,   IrLincoln BrighamPA-C Department of Hematology/Oncology CoKelloggt WeHampton Va Medical Centerhone: [phone] 11/25/2021 2:43 PM   Literature Support:  1) SuBennett ScrapeBortezomib, lenalidomide, and dexamethasone in transplant-eligible newly diagnosed multiple myeloma patients: a multicenter retrospective comparative analysis. Int J Hinton Dyer2020 Jan;111(1):103-111. doi: 10.1007/s12185-019-02764-1.  -- Overall response, very good partial response, and complete response rates after VRD were 96.4%, 45.5%, and 20.0%, respectively (median follow-up period, 17.7 months). The 1-year progression-free survival (PFS) and overall survival rates were 95.8% and 98.2%, respectively. The response rate and PFS were similar between the groups, regardless of cytogenetic risk and age.  2) JaCasandra DoffingDa[address] South Buttonwood StreetE, Pawlyn C,Loletha GrayerCaProctorStBangorCoPolkHockaday A, Jones JR, Kishore B, Garg M, Williams CD, Karunanithi K,Delaine LameW, CoLaban EmperorH, KaDorringtonDrayson MT, OwRobinette HainesM, MoMyra RudeUKVenezuelaCRI [facility]-oncology Clinical Studies Group. Lenalidomide maintenance versus observation for patients with newly diagnosed multiple myeloma (Myeloma XI): a multicentre, open-label, randomised, phase 3 trial. Lancet Oncol. 2019 Jan;20(1):57-73. doi: 10.1016/S1470-2045(18)30687-9. Epub 2018 Dec 14.   --Maintenance therapy with lenalidomide significantly improved progression-free survival in patients with newly diagnosed multiple myeloma compared with observation   --After a median follow-up of 31 months (IQR 18-50), median progression-free survival was 39 months (95% CI 36-42) with lenalidomide and 20 months (18-22) with observation (hazard ratio [  HR] 046 [95% CI 041-053]; p<00001), and 3-year overall survival was 786% (95% Cl 756-816) in the lenalidomide  group and 758% (052-591) in the observation group (HR 087 [95% CI 073-105]; p=015). Progression-free survival was improved with lenalidomide compared with observation across all prespecified subgroups.

## 2021-11-26 ENCOUNTER — Other Ambulatory Visit: Payer: Medicare Other

## 2021-11-26 LAB — KAPPA/LAMBDA LIGHT CHAINS
Kappa free light chain: 17.8 mg/L (ref 3.3–19.4)
Kappa, lambda light chain ratio: 0.61 (ref 0.26–1.65)
Lambda free light chains: 29.4 mg/L — ABNORMAL HIGH (ref 5.7–26.3)

## 2021-11-27 ENCOUNTER — Encounter: Payer: Self-pay | Admitting: Hematology and Oncology

## 2021-11-27 ENCOUNTER — Encounter: Payer: Self-pay | Admitting: Family Medicine

## 2021-11-27 LAB — MULTIPLE MYELOMA PANEL, SERUM
Albumin SerPl Elph-Mcnc: 4.2 g/dL (ref 2.9–4.4)
Albumin/Glob SerPl: 1.7 (ref 0.7–1.7)
Alpha 1: 0.2 g/dL (ref 0.0–0.4)
Alpha2 Glob SerPl Elph-Mcnc: 0.8 g/dL (ref 0.4–1.0)
B-Globulin SerPl Elph-Mcnc: 0.9 g/dL (ref 0.7–1.3)
Gamma Glob SerPl Elph-Mcnc: 0.7 g/dL (ref 0.4–1.8)
Globulin, Total: 2.5 g/dL (ref 2.2–3.9)
IgA: 137 mg/dL (ref 64–422)
IgG (Immunoglobin G), Serum: 767 mg/dL (ref 586–1602)
IgM (Immunoglobulin M), Srm: 22 mg/dL — ABNORMAL LOW (ref 26–217)
Total Protein ELP: 6.7 g/dL (ref 6.0–8.5)

## 2021-11-28 ENCOUNTER — Other Ambulatory Visit: Payer: Self-pay | Admitting: *Deleted

## 2021-11-28 LAB — UPEP/UIFE/LIGHT CHAINS/TP, 24-HR UR
% BETA, Urine: 0 %
ALPHA 1 URINE: 0 %
Albumin, U: 100 %
Alpha 2, Urine: 0 %
Free Kappa Lt Chains,Ur: 7.62 mg/L (ref 1.17–86.46)
Free Kappa/Lambda Ratio: 7.4 (ref 1.83–14.26)
Free Lambda Lt Chains,Ur: 1.03 mg/L (ref 0.27–15.21)
GAMMA GLOBULIN URINE: 0 %
Total Protein, Urine-Ur/day: 122 mg/24 hr (ref 30–150)
Total Protein, Urine: 4.8 mg/dL
Total Volume: 2550

## 2021-11-28 MED ORDER — LENALIDOMIDE 10 MG PO CAPS: 10.0000 mg | ORAL_CAPSULE | Freq: Every day | ORAL | 0 refills | Status: DC

## 2021-11-28 NOTE — Telephone Encounter (Signed)
Called patient as risk of complication score 6, multiple myeloma. Will need to discuss repeat antivirals.  Left Vm at 615pm,  no answer at 645pm,  will call back later tonight or in am.

## 2021-11-28 NOTE — Telephone Encounter (Signed)
FYI:  Pt positive for COVID given at home suggestions and advised to call if worsens.   Pt also notes gabapentin is working well and improving sleep.

## 2021-11-29 ENCOUNTER — Telehealth: Payer: Self-pay

## 2021-11-29 ENCOUNTER — Encounter: Payer: Self-pay | Admitting: Family Medicine

## 2021-11-29 ENCOUNTER — Other Ambulatory Visit (HOSPITAL_COMMUNITY): Payer: Self-pay

## 2021-11-29 NOTE — Telephone Encounter (Signed)
Pt responding to phone message

## 2021-11-29 NOTE — Telephone Encounter (Unsigned)
Called pt - no answer - left VM. Will send her a [employer].

## 2021-11-29 NOTE — Telephone Encounter (Signed)
Oral Oncology Patient Advocate Encounter  Was successful in securing patient a $12000 grant from [facility] to provide copayment coverage for Revlimid.  This will keep the out of pocket expense at $0.     [employer] ID: [id number]  I have spoken with the patient.   The billing information is as follows and has been shared with [place].    RxBin: [id number] PCN: [id number] Member ID: [id number]  Group ID: [id number] Dates of Eligibility: 10/30/21 through 10/29/22  Fund:  [place] Patient [place] Phone [phone] Fax [phone] 11/29/2021 4:45 PM

## 2021-12-02 DIAGNOSIS — Z20822 Contact with and (suspected) exposure to covid-19: Secondary | ICD-10-CM | POA: Diagnosis not present

## 2021-12-04 ENCOUNTER — Telehealth: Payer: Self-pay

## 2021-12-04 NOTE — Telephone Encounter (Signed)
Database administrator and spoke with [place]. They received Revlimid e-scribe prescription refill on 11/28/21. Patient should receive Revlimid on 12/11/21. Patient aware.

## 2021-12-05 DIAGNOSIS — Z20822 Contact with and (suspected) exposure to covid-19: Secondary | ICD-10-CM | POA: Diagnosis not present

## 2021-12-10 ENCOUNTER — Other Ambulatory Visit (INDEPENDENT_AMBULATORY_CARE_PROVIDER_SITE_OTHER): Payer: Medicare Other

## 2021-12-10 DIAGNOSIS — E782 Mixed hyperlipidemia: Secondary | ICD-10-CM | POA: Diagnosis not present

## 2021-12-10 DIAGNOSIS — I2510 Atherosclerotic heart disease of native coronary artery without angina pectoris: Secondary | ICD-10-CM

## 2021-12-10 DIAGNOSIS — G6289 Other specified polyneuropathies: Secondary | ICD-10-CM

## 2021-12-10 DIAGNOSIS — Z13228 Encounter for screening for other metabolic disorders: Secondary | ICD-10-CM

## 2021-12-10 DIAGNOSIS — Z130 Encounter for screening for diseases of the blood and blood-forming organs and certain disorders involving the immune mechanism: Secondary | ICD-10-CM | POA: Diagnosis not present

## 2021-12-10 DIAGNOSIS — E039 Hypothyroidism, unspecified: Secondary | ICD-10-CM | POA: Diagnosis not present

## 2021-12-10 DIAGNOSIS — Z1329 Encounter for screening for other suspected endocrine disorder: Secondary | ICD-10-CM

## 2021-12-10 LAB — LIPID PANEL
Cholesterol: 136 mg/dL (ref 0–200)
HDL: 61.2 mg/dL (ref >39.00)
LDL Cholesterol: 62 mg/dL (ref 0–99)
NonHDL: 75.08
Total CHOL/HDL Ratio: 2
Triglycerides: 65 mg/dL (ref 0.0–149.0)
VLDL: 13 mg/dL (ref 0.0–40.0)

## 2021-12-10 LAB — COMPREHENSIVE METABOLIC PANEL
ALT: 20 U/L (ref 0–35)
AST: 19 U/L (ref 0–37)
Albumin: 4.2 g/dL (ref 3.5–5.2)
Alkaline Phosphatase: 55 U/L (ref 39–117)
BUN: 11 mg/dL (ref 6–23)
CO2: 27 mEq/L (ref 19–32)
Calcium: 9.4 mg/dL (ref 8.4–10.5)
Chloride: 105 mEq/L (ref 96–112)
Creatinine, Ser: 0.66 mg/dL (ref 0.40–1.20)
GFR: 85.83 mL/min (ref >60.00)
Glucose, Bld: 80 mg/dL (ref 70–99)
Potassium: 3.6 mEq/L (ref 3.5–5.1)
Sodium: 140 mEq/L (ref 135–145)
Total Bilirubin: 0.7 mg/dL (ref 0.2–1.2)
Total Protein: 6.9 g/dL (ref 6.0–8.3)

## 2021-12-10 LAB — VITAMIN B12: Vitamin B-12: 359 pg/mL (ref 211–911)

## 2021-12-10 LAB — TSH: TSH: 1.01 u[IU]/mL (ref 0.35–5.50)

## 2021-12-10 NOTE — Addendum Note (Signed)
Addended by: Susy Manor on: 4/32/7614 07:26 AM   Modules accepted: Orders

## 2021-12-12 ENCOUNTER — Other Ambulatory Visit (HOSPITAL_COMMUNITY): Payer: Self-pay

## 2021-12-12 DIAGNOSIS — Z20822 Contact with and (suspected) exposure to covid-19: Secondary | ICD-10-CM | POA: Diagnosis not present

## 2021-12-15 DIAGNOSIS — Z20822 Contact with and (suspected) exposure to covid-19: Secondary | ICD-10-CM | POA: Diagnosis not present

## 2021-12-20 ENCOUNTER — Other Ambulatory Visit: Payer: Self-pay

## 2021-12-20 ENCOUNTER — Inpatient Hospital Stay: Payer: Medicare Other | Admitting: Dietician

## 2021-12-20 ENCOUNTER — Inpatient Hospital Stay: Payer: Medicare Other | Attending: Physician Assistant

## 2021-12-20 ENCOUNTER — Inpatient Hospital Stay: Payer: Medicare Other

## 2021-12-20 ENCOUNTER — Inpatient Hospital Stay (HOSPITAL_BASED_OUTPATIENT_CLINIC_OR_DEPARTMENT_OTHER): Payer: Medicare Other | Admitting: Hematology and Oncology

## 2021-12-20 VITALS — BP 136/60 | HR 63 | Temp 97.6°F | Resp 17 | Wt 141.6 lb

## 2021-12-20 DIAGNOSIS — Z7982 Long term (current) use of aspirin: Secondary | ICD-10-CM | POA: Diagnosis not present

## 2021-12-20 DIAGNOSIS — C9000 Multiple myeloma not having achieved remission: Secondary | ICD-10-CM | POA: Diagnosis not present

## 2021-12-20 DIAGNOSIS — D6481 Anemia due to antineoplastic chemotherapy: Secondary | ICD-10-CM | POA: Diagnosis not present

## 2021-12-20 DIAGNOSIS — T451X5A Adverse effect of antineoplastic and immunosuppressive drugs, initial encounter: Secondary | ICD-10-CM

## 2021-12-20 DIAGNOSIS — E079 Disorder of thyroid, unspecified: Secondary | ICD-10-CM | POA: Diagnosis not present

## 2021-12-20 DIAGNOSIS — M899 Disorder of bone, unspecified: Secondary | ICD-10-CM | POA: Diagnosis not present

## 2021-12-20 DIAGNOSIS — I10 Essential (primary) hypertension: Secondary | ICD-10-CM | POA: Diagnosis not present

## 2021-12-20 DIAGNOSIS — M898X9 Other specified disorders of bone, unspecified site: Secondary | ICD-10-CM

## 2021-12-20 DIAGNOSIS — Z79899 Other long term (current) drug therapy: Secondary | ICD-10-CM | POA: Insufficient documentation

## 2021-12-20 LAB — CBC WITH DIFFERENTIAL (CANCER CENTER ONLY)
Abs Immature Granulocytes: 0.01 10*3/uL (ref 0.00–0.07)
Basophils Absolute: 0.1 10*3/uL (ref 0.0–0.1)
Basophils Relative: 2 %
Eosinophils Absolute: 0.2 10*3/uL (ref 0.0–0.5)
Eosinophils Relative: 4 %
HCT: 36.6 % (ref 36.0–46.0)
Hemoglobin: 11.8 g/dL — ABNORMAL LOW (ref 12.0–15.0)
Immature Granulocytes: 0 %
Lymphocytes Relative: 28 %
Lymphs Abs: 1.3 10*3/uL (ref 0.7–4.0)
MCH: 28 pg (ref 26.0–34.0)
MCHC: 32.2 g/dL (ref 30.0–36.0)
MCV: 86.7 fL (ref 80.0–100.0)
Monocytes Absolute: 0.7 10*3/uL (ref 0.1–1.0)
Monocytes Relative: 16 %
Neutro Abs: 2.4 10*3/uL (ref 1.7–7.7)
Neutrophils Relative %: 50 %
Platelet Count: 248 10*3/uL (ref 150–400)
RBC: 4.22 MIL/uL (ref 3.87–5.11)
RDW: 15.5 % (ref 11.5–15.5)
WBC Count: 4.7 10*3/uL (ref 4.0–10.5)
nRBC: 0 % (ref 0.0–0.2)

## 2021-12-20 LAB — CMP (CANCER CENTER ONLY)
ALT: 18 U/L (ref 0–44)
AST: 21 U/L (ref 15–41)
Albumin: 4.3 g/dL (ref 3.5–5.0)
Alkaline Phosphatase: 50 U/L (ref 38–126)
Anion gap: 5 (ref 5–15)
BUN: 13 mg/dL (ref 8–23)
CO2: 26 mmol/L (ref 22–32)
Calcium: 9.8 mg/dL (ref 8.9–10.3)
Chloride: 107 mmol/L (ref 98–111)
Creatinine: 0.73 mg/dL (ref 0.44–1.00)
GFR, Estimated: >60 mL/min (ref >60)
Glucose, Bld: 93 mg/dL (ref 70–99)
Potassium: 4.3 mmol/L (ref 3.5–5.1)
Sodium: 138 mmol/L (ref 135–145)
Total Bilirubin: 0.8 mg/dL (ref 0.3–1.2)
Total Protein: 6.8 g/dL (ref 6.5–8.1)

## 2021-12-20 LAB — LACTATE DEHYDROGENASE: LDH: 172 U/L (ref 98–192)

## 2021-12-20 MED ORDER — ZOLEDRONIC ACID 4 MG/100ML IV SOLN
4.0000 mg | Freq: Once | INTRAVENOUS | Status: AC
  Administered 2021-12-20: 4 mg via INTRAVENOUS
  Filled 2021-12-20: qty 100

## 2021-12-20 MED ORDER — SODIUM CHLORIDE 0.9 % IV SOLN: Freq: Once | INTRAVENOUS | Status: AC

## 2021-12-20 NOTE — Progress Notes (Signed)
Nutrition Follow-up:  Patient with multiple myeloma. Patient receiving maintenance revlimid 10 mg po daily and zometa therapy q3 months.   Met with patient during infusion. Patietn eating orange slices and yogurt at visit. She reports ongoing diarrhea with oral revlimid, ~3-4 episodes daily. Patient avoids eating from 1-2 PM after taking medication. This has been helpful in reducing diarrhea. Patient reports appetite is better, she is unable to eat much later in the day. Patient reports she eats her supper around 4 PM and has a light snack with her husband around 7 PM. Patient reports eating what her body craves. She has been eating a lot of subs recently.   Medications: reviewed   Labs: reviewed   Anthropometrics: Weight 141 lb 9.6 oz today   12/30 - 142 lb 12.8 oz 12/2 - 143 lb 9 oz 11/4 140 lb 14.4 oz    NUTRITION DIAGNOSIS: Unintentional weight loss stable   INTERVENTION:  Continue high calorie high protein foods for weight maintenance Discussed strategies for diarrhea and importance of hydration - handouts provided   MONITORING, EVALUATION, GOAL: weight trends, intake    NEXT VISIT: To be scheduled as needed with treatment

## 2021-12-20 NOTE — Patient Instructions (Signed)

## 2021-12-20 NOTE — Progress Notes (Signed)
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 Concerning for multiple myeloma vs metastatic disease 12/31/2020: establish care with Dr. Lorenso Courier. UPEP showed marked Bence Jones protein, findings concerning for multiple myleoma 01/21/2021: bone marrow biopsy confirms multiple myeloma with atypical plasma cells representing 28% of all cells in the aspirate  02/01/2021: Cycle 1 Day 1 of VRd chemotherapy 02/22/2021: Cycle 2 Day 1 of VRd chemotherapy 03/15/2021: Cycle 3 Day 1 of VRd chemotherapy 04/05/2021: Cycle 4 Day 1 of VRd chemotherapy 04/26/2021: Cycle 5 Day 1 of VRd chemotherapy 05/17/2021: Cycle 6 Day 1 of VRd chemotherapy 06/07/2021:  Cycle 7 Day 1 of VRd chemotherapy 06/28/2021: Cycle 8 Day 1 of VRd chemotherapy 07/19/2021: start maintenance revlimid 52m PO daily.   Interval History:  [name] female with medical history significant for lambda light chain multiple myeloma who presents for a follow up visit. The patient's last visit was on 11/22/2021. In the interim since she has continued on her revlimid without difficulty.   On exam today Mrs. Doris Wilkerson reports she is currently on her week off of Revlimid and will be starting back tonight.  She reports that she likes to take the medication at 2 PM in the afternoon because if she takes it with food it causes considerable stomach upset with diarrhea.  She reports that taking it by itself around 2 PM is the best strategy.  She notes that she has continued issues with fatigue as well as some pain in the back but not been having any other major  issues in her health or side effects from the medication.  She denies fevers, chills, night sweats, shortness of breath, chest pain or cough. She hsa no other complaints. A full 10 point ROS is listed below.   MEDICAL HISTORY:  Past Medical History:  Diagnosis Date   Asthma    Cataract    GERD (gastroesophageal reflux disease)    Hyperlipidemia    Hypertension    Thyroid disease     SURGICAL HISTORY: Past Surgical History:  Procedure Laterality Date   Cataract Surgery     CORONARY ANGIOPLASTY WITH STENT PLACEMENT     EYE SURGERY     ORTHOPEDIC SURGERY  2012   TONSILLECTOMY  1951    SOCIAL HISTORY: Social History   Socioeconomic History   Marital status: Married    Spouse name: Not on file   Number of children: 0   Years of education: Not on file   Highest education level: Not on file  Occupational History   Occupation: unemployed  Tobacco Use   Smoking status: Never   Smokeless tobacco: Never  Vaping Use   Vaping Use: Never used  Substance and Sexual Activity   Alcohol use: Yes    Alcohol/week: 1.0 standard drink    Types: 1 Glasses of wine per week    Comment: occassionally   Drug use: No   Sexual activity: Not Currently  Other Topics Concern   Not on file  Social History Narrative   Married. Education: college. Pt does exercise-   Social Determinants of Health   Financial Resource Strain: Not on file  Food  Insecurity: Not on file  Transportation Needs: Not on file  Physical Activity: Not on file  Stress: Not on file  Social Connections: Not on file  Intimate Partner Violence: Not on file    FAMILY HISTORY: Family History  Problem Relation Age of Onset   Cancer Father    Heart disease Brother    Dementia Brother    Leukemia Brother    Breast cancer Neg Hx    Colon cancer Neg Hx    Esophageal cancer Neg Hx    Pancreatic cancer Neg Hx    Rectal cancer Neg Hx    Stomach cancer Neg Hx     ALLERGIES:  is allergic to poison ivy extract, plavix  [clopidogrel bisulfate], and other.  MEDICATIONS:  Current Outpatient Medications  Medication Sig Dispense Refill   acetaminophen (TYLENOL 8 HOUR ARTHRITIS PAIN) 650 MG CR tablet Take 1 tablet (650 mg total) by mouth every 8 (eight) hours as needed for pain.     aspirin 81 MG tablet Take 81 mg by mouth daily.      atorvastatin (LIPITOR) 80 MG tablet TAKE 1 TABLET(80 MG) BY MOUTH DAILY AT 6 PM 90 tablet 1   benazepril (LOTENSIN) 10 MG tablet TAKE 1 TABLET(10 MG) BY MOUTH DAILY 90 tablet 1   calcium carbonate (OS-CAL) 1250 (500 Ca) MG chewable tablet Chew 1 tablet by mouth daily.     cholecalciferol (VITAMIN D3) 25 MCG (1000 UNIT) tablet Take 2,000 Units by mouth daily.     Cyanocobalamin (B-12 PO) Take by mouth daily.     DM-APAP-CPM (CORICIDIN HBP PO) Take by mouth.      famotidine-calcium carbonate-magnesium hydroxide (PEPCID COMPLETE) 10-800-165 MG chewable tablet Chew 1 tablet by mouth daily.     gabapentin (NEURONTIN) 100 MG capsule Take 1 capsule (100 mg total) by mouth at bedtime. 90 capsule 1   lenalidomide (REVLIMID) 10 MG capsule Take 1 capsule (10 mg total) by mouth daily. [employer] Auth # [id number]   Date Obtained 11/28/21 Take 1 tablet daily for 21 days. None for 7 days 21 capsule 0   levothyroxine (SYNTHROID) 25 MCG tablet TAKE 1 TABLET(25 MCG) BY MOUTH DAILY BEFORE BREAKFAST 90 tablet 3   magic mouthwash (lidocaine, diphenhydrAMINE, alum & mag hydroxide) suspension Swish and spit 5 MLs by mouth every 4 hours as needed for mouth sores 240 mL 0   MAGNESIUM PO Take by mouth daily.     ondansetron (ZOFRAN) 8 MG tablet Take 1 tablet (8 mg total) by mouth every 8 (eight) hours as needed for nausea or vomiting. 30 tablet 0   prochlorperazine (COMPAZINE) 10 MG tablet Take 1 tablet (10 mg total) by mouth every 6 (six) hours as needed for nausea or vomiting. 30 tablet 0   No current facility-administered medications for this visit.    REVIEW OF SYSTEMS:   Constitutional: ( - ) fevers, ( - )   chills , ( - ) night sweats Eyes: ( - ) blurriness of vision, ( - ) double vision, ( - ) watery eyes Ears, nose, mouth, throat, and face: ( - ) mucositis, ( - ) sore throat Respiratory: ( - ) cough, ( - ) dyspnea, ( - ) wheezes Cardiovascular: ( - ) palpitation, ( - ) chest discomfort, ( - ) lower extremity swelling Gastrointestinal:  ( - ) nausea, ( - ) heartburn, ( - ) change in bowel habits Skin: ( - ) abnormal skin rashes Lymphatics: ( - ) new lymphadenopathy, ( - )  easy bruising Neurological: ( +) numbness, ( - ) tingling, ( - ) new weaknesses Behavioral/Psych: ( - ) mood change, ( - ) new changes  All other systems were reviewed with the patient and are negative.  PHYSICAL EXAMINATION: ECOG PERFORMANCE STATUS: 1 - Symptomatic but completely ambulatory  Vitals:   12/20/21 1129  BP: 136/60  Pulse: 63  Resp: 17  Temp: 97.6 F (36.4 C)  SpO2: 100%    Filed Weights   12/20/21 1129  Weight: 141 lb 9.6 oz (64.2 kg)    GENERAL: well appearing elderly Caucasian female in NAD  SKIN: skin color, texture, turgor are normal, no rashes or significant lesions EYES: conjunctiva are pink and non-injected, sclera clear LUNGS: clear to auscultation and percussion with normal breathing effort HEART: regular rate & rhythm and no murmurs and trace lower extremity edema Musculoskeletal: no cyanosis of digits and no clubbing  PSYCH: alert & oriented x 3, fluent speech NEURO: no focal motor/sensory deficits  LABORATORY DATA:  I have reviewed the data as listed CBC Latest Ref Rng & Units 12/20/2021 11/22/2021 10/25/2021  WBC 4.0 - 10.5 K/uL 4.7 5.2 5.3  Hemoglobin 12.0 - 15.0 g/dL 11.8(L) 12.2 11.4(L)  Hematocrit 36.0 - 46.0 % 36.6 37.0 34.0(L)  Platelets 150 - 400 K/uL 248 324 243    CMP Latest Ref Rng & Units 12/20/2021 12/10/2021 11/22/2021  Glucose 70 - 99 mg/dL 93 80 91  BUN 8 - 23 mg/dL _0 Creatinine 0.44 - 1.00 mg/dL 0.73 0.66 0.79  Sodium 135 - 145 mmol/L 138 140 137   Potassium 3.5 - 5.1 mmol/L 4.3 3.6 4.0  Chloride 98 - 111 mmol/L 107 105 107  CO2 22 - 32 mmol/L _1 Calcium 8.9 - 10.3 mg/dL 9.8 9.4 10.0  Total Protein 6.5 - 8.1 g/dL 6.8 6.9 6.9  Total Bilirubin 0.3 - 1.2 mg/dL 0.8 0.7 0.4  Alkaline Phos 38 - 126 U/L 50 55 61  AST 15 - 41 U/L _2 ALT 0 - 44 U/L _3 Lab Results  Component Value Date   MPROTEIN Not Observed 11/22/2021   MPROTEIN Not Observed 10/25/2021   MPROTEIN Not Observed 09/27/2021   Lab Results  Component Value Date   KPAFRELGTCHN 17.8 11/22/2021   KPAFRELGTCHN 26.4 (H) 10/25/2021   KPAFRELGTCHN 27.0 (H) 09/27/2021   LAMBDASER 29.4 (H) 11/22/2021   LAMBDASER 32.3 (H) 10/25/2021   LAMBDASER 35.1 (H) 09/27/2021   KAPLAMBRATIO 7.40 11/26/2021   KAPLAMBRATIO 0.61 11/22/2021   KAPLAMBRATIO 0.82 10/25/2021    RADIOGRAPHIC STUDIES: No results found.  ASSESSMENT & PLAN Doris Wilkerson 76 y.o. female with medical history significant for lambda light chain multiple myeloma who presents for a follow up visit.   After review the labs, review the records, schedule the patient the findings most consistent with a lambda light chain multiple myeloma.  This is getting confirmed with a bone marrow biopsy showing an abnormal plasma cell population of 28% and M protein/Bence-Jones proteins in the urine.  Given these findings the patient now carries a diagnosis of lambda light chain multiple myeloma.  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  This consists of bortezomib 1.3 mg per metered squared q week, lenalidomide 25 mg p.o. x14 days of 21-day cycle, and dexamethasone 40 mg p.o. q. weekly.  This will be continued until he reached a VGPR/ complete 8 cycles of triplet therapy, at which  time we can consider transition over to maintenance Revlimid 10 mg p.o. daily 21 of 28 days per cycle.  Ms. Hegeman is not a candidate for bone marrow transplant based on her advanced age.  She  transition to maintenance Revlimid on 07/19/2021.  # Lambda Light Chain Multiple Myeloma--VGPR on maintenance --diagnosis confirmed with bone marrow biopsy and urine protein analysis.   --patient has good functional status and would be an excellent candidate for VRd chemotherapy. I do not believe she would be a bone marrow transplant candidate based on her age.  --assure monthly restaging labs with SPEP, UPEP, and SFLC --patient declines port placement at this time --Cycle 1 Day 1 of therapy started on 02/01/2021.  --she completed Cycle 8 Day 1 of Vrd, started maintenance Revlimid on 07/19/2021. --Labs from today were reviewed and require no intervention. SPEP and sFLC pending.  Plan: -- Continue on maintenance revlimid 80m PO daily  --Monthly restaging labs with serum free light chains, UPEP, and SPEP. Last SPEP showed undetectable M protein and SFLC show a normal ratio --Return to clinic every 4 weeks  #Supportive Care --chemotherapy education complete  --zofran [id number] PRN and compazine 101mO q6H for nausea --acyclovir can be d/c --zometa therapy performed on 12/20/2021. Repeat q 3 months.  -- no pain medication required at this time.   No orders of the defined types were placed in this encounter.   All questions were answered. The patient knows to call the clinic with any problems, questions or concerns.  I have spent a total of 25 minutes minutes of face-to-face and non-face-to-face time, preparing to see the patient, performing a medically appropriate examination, counseling and educating the patient, documenting clinical information in the electronic health record,   JohOrson SlickD Department of Hematology/Oncology [facility] Seriesone: [phone]/27/2023 3:23 PM   Literature Support:  1) SuzBennett Scrapeortezomib,  lenalidomide, and dexamethasone in transplant-eligible newly diagnosed multiple myeloma patients: a multicenter retrospective comparative analysis. Int J HHinton Dyer020 Jan;111(1):103-111. doi: 10.1007/s12185-019-02764-1.  -- Overall response, very good partial response, and complete response rates after VRD were 96.4%, 45.5%, and 20.0%, respectively (median follow-up period, 17.7 months). The 1-year progression-free survival (PFS) and overall survival rates were 95.8% and 98.2%, respectively. The response rate and PFS were similar between the groups, regardless of cytogenetic risk and age.  2) JacCasandra Doff[address]av[address] Pawlyn C, Loletha GrayeraiWynantskilltrSearles ValleyolGaltockaday A, Jones JR, Kishore B, Garg M, Williams CD, Karunanithi K, Delaine Lame, CooLaban Emperor, KaiToftreesrayson MT, OweRobinette Haines, MorMyra RudeK [place] [facility]. Lenalidomide maintenance versus observation for patients with newly diagnosed multiple myeloma (Myeloma XI): a multicentre, open-label, randomised, phase 3 trial. Lancet Oncol. 2019 Jan;20(1):57-73. doi: 10.1016/S1470-2045(18)30687-9. Epub 2018 Dec 14.   --Maintenance therapy with lenalidomide significantly improved progression-free survival in patients with newly diagnosed multiple myeloma compared with observation   --After a median follow-up of 31 months (IQR 18-50), median progression-free survival was 39 months (95% CI 36-42) with lenalidomide and 20 months (18-22) with observation (hazard ratio [HR] 046 [95% CI 041-053]; p<00001), and 3-year overall survival was 786% (95% Cl 756-816) in the lenalidomide group and 758% (72(591-638n the observation group (HR 087 [95% CI 073-105]; p=015). Progression-free survival was improved with lenalidomide  compared with observation across all prespecified subgroups.

## 2021-12-23 LAB — KAPPA/LAMBDA LIGHT CHAINS
Kappa free light chain: 23.6 mg/L — ABNORMAL HIGH (ref 3.3–19.4)
Kappa, lambda light chain ratio: 0.65 (ref 0.26–1.65)
Lambda free light chains: 36.2 mg/L — ABNORMAL HIGH (ref 5.7–26.3)

## 2021-12-24 LAB — MULTIPLE MYELOMA PANEL, SERUM
Albumin SerPl Elph-Mcnc: 3.9 g/dL (ref 2.9–4.4)
Albumin/Glob SerPl: 1.4 (ref 0.7–1.7)
Alpha 1: 0.2 g/dL (ref 0.0–0.4)
Alpha2 Glob SerPl Elph-Mcnc: 0.8 g/dL (ref 0.4–1.0)
B-Globulin SerPl Elph-Mcnc: 1 g/dL (ref 0.7–1.3)
Gamma Glob SerPl Elph-Mcnc: 0.9 g/dL (ref 0.4–1.8)
Globulin, Total: 2.9 g/dL (ref 2.2–3.9)
IgA: 153 mg/dL (ref 64–422)
IgG (Immunoglobin G), Serum: 844 mg/dL (ref 586–1602)
IgM (Immunoglobulin M), Srm: 25 mg/dL — ABNORMAL LOW (ref 26–217)
Total Protein ELP: 6.8 g/dL (ref 6.0–8.5)

## 2022-01-01 ENCOUNTER — Other Ambulatory Visit: Payer: Self-pay | Admitting: Physician Assistant

## 2022-01-01 ENCOUNTER — Telehealth: Payer: Self-pay | Admitting: Hematology and Oncology

## 2022-01-01 NOTE — Telephone Encounter (Signed)
Sch per 2/7 inbasket, unable to leave message

## 2022-01-02 ENCOUNTER — Ambulatory Visit (INDEPENDENT_AMBULATORY_CARE_PROVIDER_SITE_OTHER): Payer: Medicare Other | Admitting: Family Medicine

## 2022-01-02 ENCOUNTER — Encounter: Payer: Self-pay | Admitting: Family Medicine

## 2022-01-02 ENCOUNTER — Other Ambulatory Visit: Payer: Self-pay | Admitting: *Deleted

## 2022-01-02 VITALS — BP 124/84 | HR 80 | Temp 98.2°F | Resp 15 | Ht 66.0 in | Wt 140.6 lb

## 2022-01-02 DIAGNOSIS — I2510 Atherosclerotic heart disease of native coronary artery without angina pectoris: Secondary | ICD-10-CM

## 2022-01-02 DIAGNOSIS — C9000 Multiple myeloma not having achieved remission: Secondary | ICD-10-CM | POA: Diagnosis not present

## 2022-01-02 DIAGNOSIS — G6289 Other specified polyneuropathies: Secondary | ICD-10-CM

## 2022-01-02 MED ORDER — GABAPENTIN 100 MG PO CAPS: 100.0000 mg | ORAL_CAPSULE | Freq: Two times a day (BID) | ORAL | 1 refills | Status: DC | PRN

## 2022-01-02 MED ORDER — LENALIDOMIDE 10 MG PO CAPS: 10.0000 mg | ORAL_CAPSULE | Freq: Every day | ORAL | 0 refills | Status: DC

## 2022-01-02 NOTE — Patient Instructions (Addendum)
Continue to work with nutritionist and food diary may be helpful on amount of calories per day. Can try different protein drink or nutrition bars to find one that works for you.   Gabapentin up to 2 times per day as needed. Let me know if we need to increase further.

## 2022-01-02 NOTE — Progress Notes (Signed)
Subjective:  Patient ID: Doris Wilkerson, female    DOB: [birth year]  Age: 76 y.o. MRN: [id number]  CC:  Chief Complaint  Patient presents with   Peripheral Neuropathy    Pt here for recheck in sxs, has been doing very well, would like to discuss terms of increase and other recommendations but has been happy with current results    Results    Review lab work today     HPI Doris Wilkerson presents for   Peripheral neuropathy Discussed in December.  Possibly related to previous chemo treatment.  Started on gabapentin initially 100 mg with option of increasing doses. B12 levels were normal on January 17. Taking gabapentin at 10pm - 178m. Lessens need for leg mvmt - sleeping better, less neuropathy in am.  Some daytime neuropathy.  Has returned to using Revlimid  - has caused GI side effects, some decreased appetite.  Some trouble with weight loss. Some dehydration at times prior. Followed by Dr. DLorenso Courierand followed by nutritionist.  WAnnell GreeningReadings from Last 3 Encounters:  01/02/22 140 lb 9.6 oz (63.8 kg)  12/20/21 141 lb 9.6 oz (64.2 kg)  11/22/21 142 lb 12.8 oz (64.8 kg)     History Patient Active Problem List   Diagnosis Date Noted   Physical debility 03/21/2021   Dizziness 03/21/2021   Vertigo 03/21/2021   Anemia due to antineoplastic chemotherapy 03/21/2021   Skin changes related to chemotherapy 03/21/2021   Other constipation 03/21/2021   Multiple myeloma not having achieved remission ([place] 01/27/2021   Hordeolum externum of left lower eyelid 06/28/2018   CAD (coronary artery disease) 12/09/2011   Dyslipidemia 12/09/2011   Osteopenia 12/09/2011   Hypothyroid 12/09/2011   Asthma 12/09/2011   Past Medical History:  Diagnosis Date   Asthma    Cataract    GERD (gastroesophageal reflux disease)    Hyperlipidemia    Hypertension    Thyroid disease    Past Surgical History:  Procedure Laterality Date   Cataract Surgery     CORONARY ANGIOPLASTY WITH STENT  PLACEMENT     EYE SURGERY     ORTHOPEDIC SURGERY  2012   TONSILLECTOMY  1951   Allergies  Allergen Reactions   Poison Ivy Extract Swelling   Plavix [Clopidogrel Bisulfate]    Other     seasonal   Prior to Admission medications   Medication Sig Start Date End Date Taking? Authorizing Provider  acetaminophen (TYLENOL 8 HOUR ARTHRITIS PAIN) 650 MG CR tablet Take 1 tablet (650 mg total) by mouth every 8 (eight) hours as needed for pain. 01/03/21  Yes DOrson Slick MD  aspirin 81 MG tablet Take 81 mg by mouth daily.    Yes RHayden Rasmussen MD  atorvastatin (LIPITOR) 80 MG tablet TAKE 1 TABLET(80 MG) BY MOUTH DAILY AT 6 PM 11/20/21  Yes GWendie Agreste MD  benazepril (LOTENSIN) 10 MG tablet TAKE 1 TABLET(10 MG) BY MOUTH DAILY 11/14/21  Yes GWendie Agreste MD  cholecalciferol (VITAMIN D3) 25 MCG (1000 UNIT) tablet Take 2,000 Units by mouth daily.   Yes [provider]  DM-APAP-CPM (CORICIDIN HBP PO) Take by mouth.    Yes [provider]  famotidine-calcium carbonate-magnesium hydroxide (PEPCID COMPLETE) 10-800-165 MG chewable tablet Chew 1 tablet by mouth daily.   Yes [provider]  gabapentin (NEURONTIN) 100 MG capsule Take 1 capsule (100 mg total) by mouth at bedtime. 11/21/21  Yes GWendie Agreste MD  lenalidomide (REVLIMID) 10 MG  capsule Take 1 capsule (10 mg total) by mouth daily. [employer] Auth #  [id number] Date Obtained 01/02/22 Take 1 tablet daily for 21 days. None for 7 days 01/02/22  Yes Orson Slick, MD  levothyroxine (SYNTHROID) 25 MCG tablet TAKE 1 TABLET(25 MCG) BY MOUTH DAILY BEFORE BREAKFAST 11/23/21  Yes [name], MD  ondansetron (ZOFRAN) 8 MG tablet Take 1 tablet (8 mg total) by mouth every 8 (eight) hours as needed for nausea or vomiting. 01/30/21  Yes Orson Slick, MD  calcium carbonate (OS-CAL) 1250 (500 Ca) MG chewable tablet Chew 1 tablet by mouth daily. Patient not taking: Reported on 01/02/2022    [provider]   Cyanocobalamin (B-12 PO) Take by mouth daily. Patient not taking: Reported on 01/02/2022    [provider]  magic mouthwash (lidocaine, diphenhydrAMINE, alum & mag hydroxide) suspension Swish and spit 5 MLs by mouth every 4 hours as needed for mouth sores Patient not taking: Reported on 01/02/2022 08/27/21   Orson Slick, MD  MAGNESIUM PO Take by mouth daily. Patient not taking: Reported on 01/02/2022    [provider]  prochlorperazine (COMPAZINE) 10 MG tablet Take 1 tablet (10 mg total) by mouth every 6 (six) hours as needed for nausea or vomiting. Patient not taking: Reported on 01/02/2022 01/30/21   Orson Slick, MD   Social History   Socioeconomic History   Marital status: Married    Spouse name: Not on file   Number of children: 0   Years of education: Not on file   Highest education level: Not on file  Occupational History   Occupation: unemployed  Tobacco Use   Smoking status: Never   Smokeless tobacco: Never  Vaping Use   Vaping Use: Never used  Substance and Sexual Activity   Alcohol use: Yes    Alcohol/week: 1.0 standard drink    Types: 1 Glasses of wine per week    Comment: occassionally   Drug use: No   Sexual activity: Not Currently  Other Topics Concern   Not on file  Social History Narrative   Married. Education: college. Pt does exercise-   Social Determinants of Health   Financial Resource Strain: Not on file  Food Insecurity: Not on file  Transportation Needs: Not on file  Physical Activity: Not on file  Stress: Not on file  Social Connections: Not on file  Intimate Partner Violence: Not on file    Review of Systems Per HPI  Objective:   Vitals:   01/02/22 1335  BP: 124/84  Pulse: 80  Resp: 15  Temp: 98.2 F (36.8 C)  TempSrc: Temporal  SpO2: 97%  Weight: 140 lb 9.6 oz (63.8 kg)  Height: '5\' 6"'  (1.676 m)     Physical Exam Vitals reviewed.  Constitutional:      General: She is not in acute distress.     Appearance: Normal appearance. She is well-developed.  HENT:     Head: Normocephalic and atraumatic.  Cardiovascular:     Rate and Rhythm: Normal rate.  Pulmonary:     Effort: Pulmonary effort is normal.  Neurological:     Mental Status: She is alert and oriented to person, place, and time.  Psychiatric:        Mood and Affect: Mood normal.       Assessment & Plan:  Vivianna Piccini is a 76 y.o. female . Other polyneuropathy - Plan: gabapentin (NEURONTIN) 100 MG capsule  Multiple  myeloma not having achieved remission (HCC)  Options of gabapentin dosing including morning dosing, continue evening dose as improved symptoms.  Concerns discussed regarding maintenance of weight.  Recommend she discuss further with nutritionist including possible food diary to assess caloric intake.  Other options for meals/meal supplements discussed throughout the day.  Recheck 6 months.  Meds ordered this encounter  Medications   gabapentin (NEURONTIN) 100 MG capsule    Sig: Take 1 capsule (100 mg total) by mouth 2 (two) times daily as needed.    Dispense:  180 capsule    Refill:  1   Patient Instructions  Continue to work with nutritionist and food diary may be helpful on amount of calories per day. Can try different protein drink or nutrition bars to find one that works for you.   Gabapentin up to 2 times per day as needed. Let me know if we need to increase further.      Signed,   [name], MD [place] 01/02/22 9:50 PM

## 2022-01-10 ENCOUNTER — Encounter: Payer: Self-pay | Admitting: Cardiology

## 2022-01-10 ENCOUNTER — Other Ambulatory Visit: Payer: Self-pay

## 2022-01-10 ENCOUNTER — Ambulatory Visit: Payer: Medicare Other | Admitting: Cardiology

## 2022-01-10 VITALS — BP 128/65 | HR 91 | Temp 98.7°F | Ht 66.0 in | Wt 142.0 lb

## 2022-01-10 DIAGNOSIS — I2510 Atherosclerotic heart disease of native coronary artery without angina pectoris: Secondary | ICD-10-CM | POA: Diagnosis not present

## 2022-01-10 DIAGNOSIS — I739 Peripheral vascular disease, unspecified: Secondary | ICD-10-CM | POA: Diagnosis not present

## 2022-01-10 DIAGNOSIS — E7800 Pure hypercholesterolemia, unspecified: Secondary | ICD-10-CM

## 2022-01-10 DIAGNOSIS — I10 Essential (primary) hypertension: Secondary | ICD-10-CM | POA: Diagnosis not present

## 2022-01-10 NOTE — Progress Notes (Signed)
Primary Physician/Referring:  [name], MD  Patient ID: Doris Wilkerson, female    DOB: 08-01-1946, 76 y.o.   MRN: [id number]  Chief Complaint  Patient presents with   Coronary Artery Disease   Follow-up   HPI:    Doris Wilkerson  is a 76 y.o. 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  Past medical history significant for hypertension, hyperlipidemia. 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  I had last seen her 3 years ago, she now presents to reestablish care.  He is slowly recuperating from her melanoma and feels upbeat about this as she had severe disability and was essentially to point of being wheelchair-bound with extensive bone involvement.  She has not had any chest pain or dyspnea but is concerned about underlying progression of coronary disease.  She had reduced her physical activity due to myeloma and is slowly getting back into exercising and being active.  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  Left leg symptoms are very mild.  Past Medical History:  Diagnosis Date   Asthma    Cataract    GERD (gastroesophageal reflux disease)    Hyperlipidemia    Hypertension    Thyroid disease    Past Surgical History:  Procedure Laterality Date   Cataract Surgery     CORONARY ANGIOPLASTY WITH STENT PLACEMENT     EYE SURGERY     ORTHOPEDIC SURGERY  2012   TONSILLECTOMY  1951   Family History  Problem Relation Age of Onset   Cancer Father    Heart disease Brother    Dementia Brother    Leukemia Brother    Breast cancer Neg Hx    Colon cancer Neg Hx    Esophageal cancer Neg Hx    Pancreatic cancer Neg Hx    Rectal cancer Neg Hx    Stomach cancer Neg Hx     Social History   Tobacco  Use   Smoking status: Never   Smokeless tobacco: Never  Substance Use Topics   Alcohol use: Yes    Alcohol/week: 1.0 standard drink    Types: 1 Glasses of wine per week    Comment: occassionally   Marital Status: Married  ROS  Review of Systems  Cardiovascular:  Positive for claudication. Negative for chest pain, dyspnea on exertion and leg swelling.  Objective  Blood pressure 128/65, pulse 91, temperature 98.7 F (37.1 C), temperature source Temporal, height '5\' 6"'  (1.676 m), weight 142 lb (64.4 kg), SpO2 97 %. Body mass index is 22.92 kg/m.  Vitals with BMI 01/10/2022 01/02/2022 12/20/2021  Height '5\' 6"'  '5\' 6"'  -  Weight 142 lbs 140 lbs 10 oz 141 lbs 10 oz  BMI 76.73 41.9 -  Systolic 379 024 097  Diastolic 65 84 60  Pulse 91 80 63    Physical Exam Neck:     Vascular: No JVD.  Cardiovascular:     Rate and Rhythm: Normal rate and regular rhythm.     Pulses:          Carotid pulses are 2+ on the right side and 2+ on the left side.      Femoral pulses are 1+ on the right side and 2+ on the left side.  Popliteal pulses are 0 on the right side and 2+ on the left side.       Dorsalis pedis pulses are 0 on the right side and 1+ on the left side.       Posterior tibial pulses are 0 on the right side and 1+ on the left side.     Heart sounds: Normal heart sounds. No murmur heard.   No gallop.  Pulmonary:     Effort: Pulmonary effort is normal.     Breath sounds: Normal breath sounds.  Abdominal:     General: Bowel sounds are normal.     Palpations: Abdomen is soft.  Musculoskeletal:     Right lower leg: No edema.     Left lower leg: No edema.     Medications and allergies   Allergies  Allergen Reactions   Poison Ivy Extract Swelling   Plavix [Clopidogrel Bisulfate]    Other     seasonal     Medication list after today's encounter    Current Outpatient Medications:    acetaminophen (TYLENOL 8 HOUR ARTHRITIS PAIN) 650 MG CR tablet, Take 1 tablet (650 mg total) by  mouth every 8 (eight) hours as needed for pain., Disp: , Rfl:    aspirin 81 MG tablet, Take 81 mg by mouth daily. , Disp: , Rfl:    atorvastatin (LIPITOR) 80 MG tablet, TAKE 1 TABLET(80 MG) BY MOUTH DAILY AT 6 PM, Disp: 90 tablet, Rfl: 1   benazepril (LOTENSIN) 10 MG tablet, TAKE 1 TABLET(10 MG) BY MOUTH DAILY, Disp: 90 tablet, Rfl: 1   cholecalciferol (VITAMIN D3) 25 MCG (1000 UNIT) tablet, Take 2,000 Units by mouth daily., Disp: , Rfl:    DM-APAP-CPM (CORICIDIN HBP PO), Take by mouth. , Disp: , Rfl:    famotidine-calcium carbonate-magnesium hydroxide (PEPCID COMPLETE) 10-800-165 MG chewable tablet, Chew 1 tablet by mouth daily., Disp: , Rfl:    gabapentin (NEURONTIN) 100 MG capsule, Take 1 capsule (100 mg total) by mouth 2 (two) times daily as needed., Disp: 180 capsule, Rfl: 1   lenalidomide (REVLIMID) 10 MG capsule, Take 1 capsule (10 mg total) by mouth daily. [employer] Auth #  [id number] Date Obtained 01/02/22 Take 1 tablet daily for 21 days. None for 7 days, Disp: 21 capsule, Rfl: 0   levothyroxine (SYNTHROID) 25 MCG tablet, TAKE 1 TABLET(25 MCG) BY MOUTH DAILY BEFORE BREAKFAST, Disp: 90 tablet, Rfl: 3   MAGNESIUM PO, Take by mouth daily., Disp: , Rfl:    ondansetron (ZOFRAN) 8 MG tablet, Take 1 tablet (8 mg total) by mouth every 8 (eight) hours as needed for nausea or vomiting., Disp: 30 tablet, Rfl: 0  Laboratory examination:   Recent Labs    10/25/21 1102 11/22/21 1013 12/10/21 0727 12/20/21 1037  NA 138 137 140 138  K 3.6 4.0 3.6 4.3  CL 110 107 105 107  CO2 20* '24 27 26  ' GLUCOSE 95 91 80 93  BUN '13 15 11 13  ' CREATININE 0.70 0.79 0.66 0.73  CALCIUM 8.9 10.0 9.4 9.8  GFRNONAA >60 >60  --  >60   CrCl cannot be calculated (Patient's most recent lab result is older than the maximum 21 days allowed.).  CMP Latest Ref Rng & Units 12/20/2021 12/10/2021 11/22/2021  Glucose 70 - 99 mg/dL 93 80 91  BUN 8 - 23 mg/dL '13 11 15  ' Creatinine 0.44 - 1.00 mg/dL 0.73 0.66 0.79  Sodium 135 - 145  mmol/L 138 140 137  Potassium 3.5 - 5.1  mmol/L 4.3 3.6 4.0  Chloride 98 - 111 mmol/L 107 105 107  CO2 22 - 32 mmol/L '26 27 24  ' Calcium 8.9 - 10.3 mg/dL 9.8 9.4 10.0  Total Protein 6.5 - 8.1 g/dL 6.8 6.9 6.9  Total Bilirubin 0.3 - 1.2 mg/dL 0.8 0.7 0.4  Alkaline Phos 38 - 126 U/L 50 55 61  AST 15 - 41 U/L '21 19 19  ' ALT 0 - 44 U/L '18 20 16   ' CBC Latest Ref Rng & Units 12/20/2021 11/22/2021 10/25/2021  WBC 4.0 - 10.5 K/uL 4.7 5.2 5.3  Hemoglobin 12.0 - 15.0 g/dL 11.8(L) 12.2 11.4(L)  Hematocrit 36.0 - 46.0 % 36.6 37.0 34.0(L)  Platelets 150 - 400 K/uL 248 324 243    Lipid Panel Recent Labs    05/03/21 0000 12/10/21 0727  CHOL 163 136  TRIG 85 65.0  LDLCALC 69 62  VLDL  --  13.0  HDL 78 61.20  CHOLHDL 2.1 2   HEMOGLOBIN A1C No results found for: HGBA1C, MPG TSH Recent Labs    04/26/21 0951 12/10/21 0727  TSH 0.735 1.01   Radiology:     Cardiac Studies:   Coronary angiogram 11/28/2002: Proximal LAD stent: 3.0x18 Cypher.   Exercise sestamibi 11/28/11: Exercise duration 6 min. 5.3 METs. Normal perfusion. No ischemia. Normal.   Echo 04/30/11 Normal LVEF. Diastolic dysfunction. Flat coaption of  MV leaflet, trace MR without MVP.  EKG:   EKG 01/10/2022: Normal sinus rhythm with rate of 63 bpm, left axis deviation, left anterior fascicular block.  Incomplete right bundle branch block.  LVH.    Assessment     ICD-10-CM   1. Coronary artery disease involving native coronary artery of native heart without angina pectoris  I25.10 EKG 12-Lead    PCV ECHOCARDIOGRAM COMPLETE    PCV MYOCARDIAL PERFUSION WO LEXISCAN    2. 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 Primary hypertension  I10     4. Hypercholesteremia  E78.00        Medications Discontinued During This Encounter  Medication Reason   Cyanocobalamin (B-12 PO)     No orders of the defined types were placed in this encounter.  Orders Placed This Encounter   Procedures   PCV MYOCARDIAL PERFUSION WO LEXISCAN    Standing Status:   Future    Standing Expiration Date:   03/10/2022   EKG 12-Lead   PCV ECHOCARDIOGRAM COMPLETE    Standing Status:   Future    Standing Expiration Date:   01/10/2023   Recommendations:   Doris Wilkerson is a 76 y.o.  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  Past medical history significant for hypertension, hyperlipidemia. 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  She has not had any chest pain or dyspnea but is concerned about underlying progression of coronary disease.  She has not had any cardiac work-up in almost a decade.  We will schedule her for exercise nuclear stress test and echocardiogram.  Blood pressure is well controlled, lipids are at goal.  Her symptoms of right leg weakness are clearly suggestive of right SFA high-grade stenosis or occlusion.  She will need lower extremity arterial duplex.  I will see her back in 6 weeks for follow-up.   [name], MD, [facility] 01/11/2022, 8:02 AM Office: [phone]

## 2022-01-17 ENCOUNTER — Inpatient Hospital Stay: Payer: Medicare Other | Attending: Physician Assistant

## 2022-01-17 ENCOUNTER — Other Ambulatory Visit: Payer: Self-pay

## 2022-01-17 ENCOUNTER — Inpatient Hospital Stay (HOSPITAL_BASED_OUTPATIENT_CLINIC_OR_DEPARTMENT_OTHER): Payer: Medicare Other | Admitting: Hematology and Oncology

## 2022-01-17 ENCOUNTER — Other Ambulatory Visit: Payer: Self-pay | Admitting: Hematology and Oncology

## 2022-01-17 VITALS — BP 140/84 | HR 79 | Temp 98.4°F | Resp 20 | Ht 66.0 in | Wt 141.6 lb

## 2022-01-17 DIAGNOSIS — C9000 Multiple myeloma not having achieved remission: Secondary | ICD-10-CM | POA: Diagnosis not present

## 2022-01-17 DIAGNOSIS — Z79899 Other long term (current) drug therapy: Secondary | ICD-10-CM | POA: Insufficient documentation

## 2022-01-17 DIAGNOSIS — M899 Disorder of bone, unspecified: Secondary | ICD-10-CM | POA: Diagnosis not present

## 2022-01-17 DIAGNOSIS — C9001 Multiple myeloma in remission: Secondary | ICD-10-CM

## 2022-01-17 DIAGNOSIS — Z7982 Long term (current) use of aspirin: Secondary | ICD-10-CM | POA: Insufficient documentation

## 2022-01-17 LAB — CBC WITH DIFFERENTIAL (CANCER CENTER ONLY)
Abs Immature Granulocytes: 0.01 10*3/uL (ref 0.00–0.07)
Basophils Absolute: 0.1 10*3/uL (ref 0.0–0.1)
Basophils Relative: 2 %
Eosinophils Absolute: 0.2 10*3/uL (ref 0.0–0.5)
Eosinophils Relative: 4 %
HCT: 38.6 % (ref 36.0–46.0)
Hemoglobin: 12.2 g/dL (ref 12.0–15.0)
Immature Granulocytes: 0 %
Lymphocytes Relative: 26 %
Lymphs Abs: 1.3 10*3/uL (ref 0.7–4.0)
MCH: 28 pg (ref 26.0–34.0)
MCHC: 31.6 g/dL (ref 30.0–36.0)
MCV: 88.5 fL (ref 80.0–100.0)
Monocytes Absolute: 0.7 10*3/uL (ref 0.1–1.0)
Monocytes Relative: 14 %
Neutro Abs: 2.7 10*3/uL (ref 1.7–7.7)
Neutrophils Relative %: 54 %
Platelet Count: 278 10*3/uL (ref 150–400)
RBC: 4.36 MIL/uL (ref 3.87–5.11)
RDW: 15.1 % (ref 11.5–15.5)
WBC Count: 5 10*3/uL (ref 4.0–10.5)
nRBC: 0 % (ref 0.0–0.2)

## 2022-01-17 LAB — CMP (CANCER CENTER ONLY)
ALT: 26 U/L (ref 0–44)
AST: 27 U/L (ref 15–41)
Albumin: 4.6 g/dL (ref 3.5–5.0)
Alkaline Phosphatase: 55 U/L (ref 38–126)
Anion gap: 4 — ABNORMAL LOW (ref 5–15)
BUN: 13 mg/dL (ref 8–23)
CO2: 28 mmol/L (ref 22–32)
Calcium: 10.3 mg/dL (ref 8.9–10.3)
Chloride: 106 mmol/L (ref 98–111)
Creatinine: 0.78 mg/dL (ref 0.44–1.00)
GFR, Estimated: >60 mL/min (ref >60)
Glucose, Bld: 81 mg/dL (ref 70–99)
Potassium: 4.3 mmol/L (ref 3.5–5.1)
Sodium: 138 mmol/L (ref 135–145)
Total Bilirubin: 0.8 mg/dL (ref 0.3–1.2)
Total Protein: 7.3 g/dL (ref 6.5–8.1)

## 2022-01-17 LAB — LACTATE DEHYDROGENASE: LDH: 187 U/L (ref 98–192)

## 2022-01-17 NOTE — Progress Notes (Signed)
[place] [phone]   [phone]  PROGRESS NOTE  Patient Care Team: [name], MD as PCP - General (Family Medicine) [name], MD as Consulting Physician (Cardiology) [name], MD (Ophthalmology)  Hematological/Oncological History # Lambda Light Chain Multiple Myeloma 12/26/2020: MRI of pelvis showed lytic lesions on L4, L5, the sacrum, with pathologic fracture of left inferior pubic ramus. Concerning for multiple myeloma vs metastatic disease 12/31/2020: establish care with Dr. Lorenso Courier. UPEP showed marked Bence Jones protein, findings concerning for multiple myleoma 01/21/2021: bone marrow biopsy confirms multiple myeloma with atypical plasma cells representing 28% of all cells in the aspirate  02/01/2021: Cycle 1 Day 1 of VRd chemotherapy 02/22/2021: Cycle 2 Day 1 of VRd chemotherapy 03/15/2021: Cycle 3 Day 1 of VRd chemotherapy 04/05/2021: Cycle 4 Day 1 of VRd chemotherapy 04/26/2021: Cycle 5 Day 1 of VRd chemotherapy 05/17/2021: Cycle 6 Day 1 of VRd chemotherapy 06/07/2021:  Cycle 7 Day 1 of VRd chemotherapy 06/28/2021: Cycle 8 Day 1 of VRd chemotherapy 07/19/2021: start maintenance revlimid 60m PO daily.   Interval History:  [name]76y.o. female with medical history significant for lambda light chain multiple myeloma who presents for a follow up visit. The patient's last visit was on 12/20/2021. In the interim since she has continued on her revlimid without difficulty.   On exam today Doris Wilkerson reports things have been going well in the interim since her last visit.  She notes that she is "figuring out her Revlimid".  She notes that on days 15-17 in the last cycle she did have some loose stools.  She notes it tends to occur quickly after eating about 20 minutes afterwards.  Overall she is tolerating it well.  She notes that her appetite has been less as of late.  She notes that she continues to have some neuropathic symptoms which she  describes as "annoying" but not quite "bothersome".  She notes that she is currently straightening out receiving the Revlimid dosage on time and working things out with her insurance.  She denies fevers, chills, night sweats, shortness of breath, chest pain or cough. She hsa no other complaints. A full 10 point ROS is listed below.   MEDICAL HISTORY:  Past Medical History:  Diagnosis Date   Asthma    Cataract    GERD (gastroesophageal reflux disease)    Hyperlipidemia    Hypertension    Thyroid disease     SURGICAL HISTORY: Past Surgical History:  Procedure Laterality Date   Cataract Surgery     CORONARY ANGIOPLASTY WITH STENT PLACEMENT     EYE SURGERY     ORTHOPEDIC SURGERY  2012   TONSILLECTOMY  1951    SOCIAL HISTORY: Social History   Socioeconomic History   Marital status: Married    Spouse name: Not on file   Number of children: 0   Years of education: Not on file   Highest education level: Not on file  Occupational History   Occupation: unemployed  Tobacco Use   Smoking status: Never   Smokeless tobacco: Never  Vaping Use   Vaping Use: Never used  Substance and Sexual Activity   Alcohol use: Yes    Alcohol/week: 1.0 standard drink    Types: 1 Glasses of wine per week    Comment: occassionally   Drug use: No   Sexual activity: Not Currently  Other Topics Concern   Not on file  Social History Narrative   Married. Education: college.  Pt does exercise-   Social Determinants of Health   Financial Resource Strain: Not on file  Food Insecurity: Not on file  Transportation Needs: Not on file  Physical Activity: Not on file  Stress: Not on file  Social Connections: Not on file  Intimate Partner Violence: Not on file    FAMILY HISTORY: Family History  Problem Relation Age of Onset   Cancer Father    Heart disease Brother    Dementia Brother    Leukemia Brother    Breast cancer Neg Hx    Colon cancer Neg Hx    Esophageal cancer Neg Hx    Pancreatic  cancer Neg Hx    Rectal cancer Neg Hx    Stomach cancer Neg Hx     ALLERGIES:  is allergic to poison ivy extract, plavix [clopidogrel bisulfate], and other.  MEDICATIONS:  Current Outpatient Medications  Medication Sig Dispense Refill   acetaminophen (TYLENOL 8 HOUR ARTHRITIS PAIN) 650 MG CR tablet Take 1 tablet (650 mg total) by mouth every 8 (eight) hours as needed for pain.     aspirin 81 MG tablet Take 81 mg by mouth daily.      atorvastatin (LIPITOR) 80 MG tablet TAKE 1 TABLET(80 MG) BY MOUTH DAILY AT 6 PM 90 tablet 1   benazepril (LOTENSIN) 10 MG tablet TAKE 1 TABLET(10 MG) BY MOUTH DAILY 90 tablet 1   cholecalciferol (VITAMIN D3) 25 MCG (1000 UNIT) tablet Take 2,000 Units by mouth daily.     DM-APAP-CPM (CORICIDIN HBP PO) Take by mouth.      famotidine-calcium carbonate-magnesium hydroxide (PEPCID COMPLETE) 10-800-165 MG chewable tablet Chew 1 tablet by mouth daily.     gabapentin (NEURONTIN) 100 MG capsule Take 1 capsule (100 mg total) by mouth 2 (two) times daily as needed. 180 capsule 1   lenalidomide (REVLIMID) 10 MG capsule Take 1 capsule (10 mg total) by mouth daily. [employer] Auth #  [id number] Date Obtained 01/02/22 Take 1 tablet daily for 21 days. None for 7 days 21 capsule 0   levothyroxine (SYNTHROID) 25 MCG tablet TAKE 1 TABLET(25 MCG) BY MOUTH DAILY BEFORE BREAKFAST 90 tablet 3   MAGNESIUM PO Take by mouth daily.     ondansetron (ZOFRAN) 8 MG tablet Take 1 tablet (8 mg total) by mouth every 8 (eight) hours as needed for nausea or vomiting. 30 tablet 0   No current facility-administered medications for this visit.    REVIEW OF SYSTEMS:   Constitutional: ( - ) fevers, ( - )  chills , ( - ) night sweats Eyes: ( - ) blurriness of vision, ( - ) double vision, ( - ) watery eyes Ears, nose, mouth, throat, and face: ( - ) mucositis, ( - ) sore throat Respiratory: ( - ) cough, ( - ) dyspnea, ( - ) wheezes Cardiovascular: ( - ) palpitation, ( - ) chest discomfort, ( - ) lower  extremity swelling Gastrointestinal:  ( - ) nausea, ( - ) heartburn, ( - ) change in bowel habits Skin: ( - ) abnormal skin rashes Lymphatics: ( - ) new lymphadenopathy, ( - ) easy bruising Neurological: ( +) numbness, ( - ) tingling, ( - ) new weaknesses Behavioral/Psych: ( - ) mood change, ( - ) new changes  All other systems were reviewed with the patient and are negative.  PHYSICAL EXAMINATION: ECOG PERFORMANCE STATUS: 1 - Symptomatic but completely ambulatory  Vitals:   01/17/22 1058  BP: 140/84  Pulse: 79  Resp: 20  Temp: 98.4 F (36.9 C)  SpO2: 98%    Filed Weights   01/17/22 1058  Weight: 141 lb 9.6 oz (64.2 kg)    GENERAL: well appearing elderly Caucasian female in NAD  SKIN: skin color, texture, turgor are normal, no rashes or significant lesions EYES: conjunctiva are pink and non-injected, sclera clear LUNGS: clear to auscultation and percussion with normal breathing effort HEART: regular rate & rhythm and no murmurs and trace lower extremity edema Musculoskeletal: no cyanosis of digits and no clubbing  PSYCH: alert & oriented x 3, fluent speech NEURO: no focal motor/sensory deficits  LABORATORY DATA:  I have reviewed the data as listed CBC Latest Ref Rng & Units 01/17/2022 12/20/2021 11/22/2021  WBC 4.0 - 10.5 K/uL 5.0 4.7 5.2  Hemoglobin 12.0 - 15.0 g/dL 12.2 11.8(L) 12.2  Hematocrit 36.0 - 46.0 % 38.6 36.6 37.0  Platelets 150 - 400 K/uL 278 248 324    CMP Latest Ref Rng & Units 01/17/2022 12/20/2021 12/10/2021  Glucose 70 - 99 mg/dL 81 93 80  BUN 8 - 23 mg/dL '13 13 11  ' Creatinine 0.44 - 1.00 mg/dL 0.78 0.73 0.66  Sodium 135 - 145 mmol/L 138 138 140  Potassium 3.5 - 5.1 mmol/L 4.3 4.3 3.6  Chloride 98 - 111 mmol/L 106 107 105  CO2 22 - 32 mmol/L '28 26 27  ' Calcium 8.9 - 10.3 mg/dL 10.3 9.8 9.4  Total Protein 6.5 - 8.1 g/dL 7.3 6.8 6.9  Total Bilirubin 0.3 - 1.2 mg/dL 0.8 0.8 0.7  Alkaline Phos 38 - 126 U/L 55 50 55  AST 15 - 41 U/L '27 21 19  ' ALT 0 - 44  U/L '26 18 20    ' Lab Results  Component Value Date   MPROTEIN Not Observed 12/20/2021   MPROTEIN Not Observed 11/22/2021   MPROTEIN Not Observed 10/25/2021   Lab Results  Component Value Date   KPAFRELGTCHN 21.6 (H) 01/17/2022   KPAFRELGTCHN 23.6 (H) 12/20/2021   KPAFRELGTCHN 17.8 11/22/2021   LAMBDASER 32.6 (H) 01/17/2022   LAMBDASER 36.2 (H) 12/20/2021   LAMBDASER 29.4 (H) 11/22/2021   KAPLAMBRATIO 0.66 01/17/2022   KAPLAMBRATIO 0.65 12/20/2021   KAPLAMBRATIO 7.40 11/26/2021    RADIOGRAPHIC STUDIES: No results found.  ASSESSMENT & PLAN Doris Wilkerson 76 y.o. female with medical history significant for lambda light chain multiple myeloma who presents for a follow up visit.   After review the labs, review the records, schedule the patient the findings most consistent with a lambda light chain multiple myeloma.  This is getting confirmed with a bone marrow biopsy showing an abnormal plasma cell population of 28% and M protein/Bence-Jones proteins in the urine.  Given these findings the patient now carries a diagnosis of lambda light chain multiple myeloma.  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  This consists of bortezomib 1.3 mg per metered squared q week, lenalidomide 25 mg p.o. x14 days of 21-day cycle, and dexamethasone 40 mg p.o. q. weekly.  This will be continued until he reached a VGPR/ complete 8 cycles of triplet therapy, at which time we can consider transition over to maintenance Revlimid 10 mg p.o. daily 21 of 28 days per cycle.  Doris Wilkerson is not a candidate for bone marrow transplant based on her advanced age.  She transition to maintenance Revlimid on 07/19/2021.  # Lambda Light Chain Multiple Myeloma--VGPR on maintenance --diagnosis confirmed with bone marrow biopsy and urine protein analysis.   --patient has  good functional status and would be an excellent candidate for VRd chemotherapy. I do not believe she would be a bone  marrow transplant candidate based on her age.  --assure monthly restaging labs with SPEP, UPEP, and SFLC --patient declines port placement at this time --Cycle 1 Day 1 of therapy started on 02/01/2021.  --she completed Cycle 8 Day 1 of Vrd, started maintenance Revlimid on 07/19/2021. --Labs from today were reviewed and require no intervention. SPEP and sFLC pending.  Plan: -- Continue on maintenance revlimid 36m PO daily  --Monthly restaging labs with serum free light chains, UPEP, and SPEP. Last SPEP showed undetectable M protein and SFLC show a normal ratio --Labs today show white blood cell count 5.0, hemoglobin 12.2, platelets 278 and creatinine 0.78 --Return to clinic every 4 weeks  #Supportive Care --chemotherapy education complete  --zofran [id number] PRN and compazine 1056mO q6H for nausea --acyclovir can be d/c --zometa therapy performed on 12/20/2021. Repeat q 3 months.  -- no pain medication required at this time.   No orders of the defined types were placed in this encounter.   All questions were answered. The patient knows to call the clinic with any problems, questions or concerns.  I have spent a total of 30 minutes minutes of face-to-face and non-face-to-face time, preparing to see the patient, performing a medically appropriate examination, counseling and educating the patient, documenting clinical information in the electronic health record,   JohOrson SlickD Department of Hematology/Oncology [facility]: [phone]/27/2023 5:55 PM   Literature Support:  1) SuzBennett Scrapeortezomib, lenalidomide, and dexamethasone in transplant-eligible newly diagnosed multiple myeloma patients: a multicenter retrospective comparative analysis. Int J HHinton Dyer020 Jan;111(1):103-111. doi:  10.1007/s12185-019-02764-1.  -- Overall response, very good partial response, and complete response rates after VRD were 96.4%, 45.5%, and 20.0%, respectively (median follow-up period, 17.7 months). The 1-year progression-free survival (PFS) and overall survival rates were 95.8% and 98.2%, respectively. The response rate and PFS were similar between the groups, regardless of cytogenetic risk and age.  2) JacCasandra [address] Pawlyn C, Loletha GrayeraiAlabastertrFultonolHeadrickockaday A, Jones JR, Kishore B, Garg M, Williams CD, Karunanithi K, Delaine Lame, CooLaban Emperor, KaiBerlinrayson MT, OweRobinette Haines, MorMyra RudeK [place] [facility]. Lenalidomide maintenance versus observation for patients with newly diagnosed multiple myeloma (Myeloma XI): a multicentre, open-label, randomised, phase 3 trial. Lancet Oncol. 2019 Jan;20(1):57-73. doi: 10.1016/S1470-2045(18)30687-9. Epub 2018 Dec 14.   --Maintenance therapy with lenalidomide significantly improved progression-free survival in patients with newly diagnosed multiple myeloma compared with observation   --After a median follow-up of 31 months (IQR 18-50), median progression-free survival was 39 months (95% CI 36-42) with lenalidomide and 20 months (18-22) with observation (hazard ratio [HR] 046 [95% CI 041-053]; p<00001), and 3-year overall survival was 786% (95% Cl 756-816) in the lenalidomide group and 758% (72(459-977n the observation group (HR 087 [95% CI 073-105]; p=015). Progression-free survival was improved with lenalidomide compared with observation across all prespecified subgroups.

## 2022-01-20 ENCOUNTER — Encounter: Payer: Self-pay | Admitting: Hematology and Oncology

## 2022-01-20 LAB — MULTIPLE MYELOMA PANEL, SERUM
Albumin SerPl Elph-Mcnc: 4.1 g/dL (ref 2.9–4.4)
Albumin/Glob SerPl: 1.6 (ref 0.7–1.7)
Alpha 1: 0.2 g/dL (ref 0.0–0.4)
Alpha2 Glob SerPl Elph-Mcnc: 0.8 g/dL (ref 0.4–1.0)
B-Globulin SerPl Elph-Mcnc: 0.9 g/dL (ref 0.7–1.3)
Gamma Glob SerPl Elph-Mcnc: 0.8 g/dL (ref 0.4–1.8)
Globulin, Total: 2.7 g/dL (ref 2.2–3.9)
IgA: 168 mg/dL (ref 64–422)
IgG (Immunoglobin G), Serum: 776 mg/dL (ref 586–1602)
IgM (Immunoglobulin M), Srm: 22 mg/dL — ABNORMAL LOW (ref 26–217)
Total Protein ELP: 6.8 g/dL (ref 6.0–8.5)

## 2022-01-20 LAB — KAPPA/LAMBDA LIGHT CHAINS
Kappa free light chain: 21.6 mg/L — ABNORMAL HIGH (ref 3.3–19.4)
Kappa, lambda light chain ratio: 0.66 (ref 0.26–1.65)
Lambda free light chains: 32.6 mg/L — ABNORMAL HIGH (ref 5.7–26.3)

## 2022-01-21 ENCOUNTER — Telehealth: Payer: Self-pay | Admitting: Hematology and Oncology

## 2022-01-21 NOTE — Telephone Encounter (Signed)
Scheduled per 2/24 los, pt has been called and confirmed

## 2022-01-28 ENCOUNTER — Other Ambulatory Visit: Payer: Self-pay | Admitting: Hematology and Oncology

## 2022-01-30 ENCOUNTER — Other Ambulatory Visit: Payer: Medicare Other

## 2022-01-30 ENCOUNTER — Encounter: Payer: Self-pay | Admitting: Hematology and Oncology

## 2022-01-31 ENCOUNTER — Other Ambulatory Visit: Payer: Self-pay | Admitting: *Deleted

## 2022-01-31 MED ORDER — LENALIDOMIDE 10 MG PO CAPS: ORAL_CAPSULE | ORAL | 0 refills | Status: DC

## 2022-02-03 ENCOUNTER — Other Ambulatory Visit: Payer: Self-pay

## 2022-02-03 ENCOUNTER — Ambulatory Visit: Payer: Medicare Other

## 2022-02-03 DIAGNOSIS — I2510 Atherosclerotic heart disease of native coronary artery without angina pectoris: Secondary | ICD-10-CM

## 2022-02-05 ENCOUNTER — Ambulatory Visit: Payer: Medicare Other

## 2022-02-05 ENCOUNTER — Encounter: Payer: Self-pay | Admitting: *Deleted

## 2022-02-05 ENCOUNTER — Other Ambulatory Visit: Payer: Self-pay

## 2022-02-05 ENCOUNTER — Other Ambulatory Visit (HOSPITAL_COMMUNITY): Payer: Self-pay

## 2022-02-05 ENCOUNTER — Encounter: Payer: Self-pay | Admitting: Hematology and Oncology

## 2022-02-05 DIAGNOSIS — I739 Peripheral vascular disease, unspecified: Secondary | ICD-10-CM | POA: Diagnosis not present

## 2022-02-05 DIAGNOSIS — I2510 Atherosclerotic heart disease of native coronary artery without angina pectoris: Secondary | ICD-10-CM

## 2022-02-06 ENCOUNTER — Telehealth: Payer: Self-pay

## 2022-02-06 NOTE — Telephone Encounter (Signed)
Oral Oncology Patient Advocate Encounter ? ?Met patient in lobby to complete application for [place] in an effort to reduce patient's out of pocket expense for Revlimid to $0.   ? ?Application completed and faxed to [phone].  ? ?[facility] Access Support phone number for follow up is [phone]. ? ?This encounter will be updated until final determination. ? ?Wynn Maudlin CPHT ?Specialty Pharmacy Patient Advocate ?[place] ?Phone [phone] ?Fax [phone] ?02/06/2022 8:38 AM ? ? ?

## 2022-02-06 NOTE — Telephone Encounter (Signed)
Patient is approved for Revlimid at no cost from [facility] 02/04/22-11/23/22 ? ?[facility] uses [facility] ? ?Wynn Maudlin CPHT ?Specialty Pharmacy Patient Advocate ?[place] ?Phone [phone] ?Fax [phone] ?02/06/2022 8:39 AM ? ?

## 2022-02-13 ENCOUNTER — Other Ambulatory Visit: Payer: Self-pay | Admitting: Physician Assistant

## 2022-02-13 DIAGNOSIS — C9001 Multiple myeloma in remission: Secondary | ICD-10-CM

## 2022-02-17 ENCOUNTER — Inpatient Hospital Stay (HOSPITAL_BASED_OUTPATIENT_CLINIC_OR_DEPARTMENT_OTHER): Payer: Medicare Other | Admitting: Hematology and Oncology

## 2022-02-17 ENCOUNTER — Inpatient Hospital Stay: Payer: Medicare Other | Attending: Physician Assistant

## 2022-02-17 ENCOUNTER — Other Ambulatory Visit: Payer: Self-pay

## 2022-02-17 VITALS — BP 123/75 | HR 69 | Temp 97.5°F | Resp 16 | Wt 139.4 lb

## 2022-02-17 DIAGNOSIS — C9001 Multiple myeloma in remission: Secondary | ICD-10-CM

## 2022-02-17 DIAGNOSIS — T451X5A Adverse effect of antineoplastic and immunosuppressive drugs, initial encounter: Secondary | ICD-10-CM

## 2022-02-17 DIAGNOSIS — Z7982 Long term (current) use of aspirin: Secondary | ICD-10-CM | POA: Insufficient documentation

## 2022-02-17 DIAGNOSIS — M899 Disorder of bone, unspecified: Secondary | ICD-10-CM | POA: Diagnosis not present

## 2022-02-17 DIAGNOSIS — Z79899 Other long term (current) drug therapy: Secondary | ICD-10-CM | POA: Diagnosis not present

## 2022-02-17 DIAGNOSIS — D6481 Anemia due to antineoplastic chemotherapy: Secondary | ICD-10-CM | POA: Diagnosis not present

## 2022-02-17 DIAGNOSIS — C9000 Multiple myeloma not having achieved remission: Secondary | ICD-10-CM | POA: Diagnosis not present

## 2022-02-17 LAB — CMP (CANCER CENTER ONLY)
ALT: 21 U/L (ref 0–44)
AST: 22 U/L (ref 15–41)
Albumin: 4.3 g/dL (ref 3.5–5.0)
Alkaline Phosphatase: 52 U/L (ref 38–126)
Anion gap: 4 — ABNORMAL LOW (ref 5–15)
BUN: 13 mg/dL (ref 8–23)
CO2: 27 mmol/L (ref 22–32)
Calcium: 9.5 mg/dL (ref 8.9–10.3)
Chloride: 106 mmol/L (ref 98–111)
Creatinine: 0.78 mg/dL (ref 0.44–1.00)
GFR, Estimated: >60 mL/min (ref >60)
Glucose, Bld: 89 mg/dL (ref 70–99)
Potassium: 3.7 mmol/L (ref 3.5–5.1)
Sodium: 137 mmol/L (ref 135–145)
Total Bilirubin: 0.6 mg/dL (ref 0.3–1.2)
Total Protein: 6.7 g/dL (ref 6.5–8.1)

## 2022-02-17 LAB — CBC WITH DIFFERENTIAL (CANCER CENTER ONLY)
Abs Immature Granulocytes: 0.01 10*3/uL (ref 0.00–0.07)
Basophils Absolute: 0.1 10*3/uL (ref 0.0–0.1)
Basophils Relative: 2 %
Eosinophils Absolute: 0.4 10*3/uL (ref 0.0–0.5)
Eosinophils Relative: 7 %
HCT: 37 % (ref 36.0–46.0)
Hemoglobin: 12.1 g/dL (ref 12.0–15.0)
Immature Granulocytes: 0 %
Lymphocytes Relative: 29 %
Lymphs Abs: 1.5 10*3/uL (ref 0.7–4.0)
MCH: 28.9 pg (ref 26.0–34.0)
MCHC: 32.7 g/dL (ref 30.0–36.0)
MCV: 88.5 fL (ref 80.0–100.0)
Monocytes Absolute: 0.8 10*3/uL (ref 0.1–1.0)
Monocytes Relative: 16 %
Neutro Abs: 2.3 10*3/uL (ref 1.7–7.7)
Neutrophils Relative %: 46 %
Platelet Count: 229 10*3/uL (ref 150–400)
RBC: 4.18 MIL/uL (ref 3.87–5.11)
RDW: 15 % (ref 11.5–15.5)
WBC Count: 5.1 10*3/uL (ref 4.0–10.5)
nRBC: 0 % (ref 0.0–0.2)

## 2022-02-17 LAB — LACTATE DEHYDROGENASE: LDH: 148 U/L (ref 98–192)

## 2022-02-17 NOTE — Progress Notes (Signed)
?[place] ?[phone]   [phone] ? ?PROGRESS NOTE ? ?Patient Care Team: ?[name], MD as PCP - General (Family Medicine) ?[name], MD as Consulting Physician (Cardiology) ?[name], MD (Ophthalmology) ? ?Hematological/Oncological History ?# Lambda Light Chain Multiple Myeloma ?12/26/2020: MRI of pelvis showed lytic lesions on L4, L5, the sacrum, with pathologic fracture of left inferior pubic ramus. Concerning for multiple myeloma vs metastatic disease ?12/31/2020: establish care with Dr. Lorenso Courier. UPEP showed marked Bence Jones protein, findings concerning for multiple myleoma ?01/21/2021: bone marrow biopsy confirms multiple myeloma with atypical plasma cells representing 28% of all cells in the aspirate ? 02/01/2021: Cycle 1 Day 1 of VRd chemotherapy ?02/22/2021: Cycle 2 Day 1 of VRd chemotherapy ?03/15/2021: Cycle 3 Day 1 of VRd chemotherapy ?04/05/2021: Cycle 4 Day 1 of VRd chemotherapy ?04/26/2021: Cycle 5 Day 1 of VRd chemotherapy ?05/17/2021: Cycle 6 Day 1 of VRd chemotherapy ?06/07/2021:  Cycle 7 Day 1 of VRd chemotherapy ?06/28/2021: Cycle 8 Day 1 of VRd chemotherapy ?07/19/2021: start maintenance revlimid 75m PO daily.  ? ?Interval History:  ?Doris [id number]. female with medical history significant for lambda light chain multiple myeloma who presents for a follow up visit. The patient's last visit was on 01/17/2022. In the interim since she has continued on her revlimid without difficulty.  ? ?On exam today Doris Wilkerson reports only interim since her last visit.  She is continue the Revlimid pills without any major side effects.  She reports her energy levels are quite good.  Despite feeling well she continues to have a steady decline in her weight.  She notes that she does still have some occasional symptoms of neuropathy but that is overall well controlled.  She has been walking the dog and gardening return to activities he is not able to do during the  course of treatment.  She denies fevers, chills, night sweats, shortness of breath, chest pain or cough. She hsa no other complaints. A full 10 point ROS is listed below.  ? ?MEDICAL HISTORY:  ?Past Medical History:  ?Diagnosis Date  ? Asthma   ? Cataract   ? GERD (gastroesophageal reflux disease)   ? Hyperlipidemia   ? Hypertension   ? Thyroid disease   ? ? ?SURGICAL HISTORY: ?Past Surgical History:  ?Procedure Laterality Date  ? Cataract Surgery    ? CORONARY ANGIOPLASTY WITH STENT PLACEMENT    ? EYE SURGERY    ? ORTHOPEDIC SURGERY  2012  ? TONSILLECTOMY  1951  ? ? ?SOCIAL HISTORY: ?Social History  ? ?Socioeconomic History  ? Marital status: Married  ?  Spouse name: Not on file  ? Number of children: 0  ? Years of education: Not on file  ? Highest education level: Not on file  ?Occupational History  ? Occupation: unemployed  ?Tobacco Use  ? Smoking status: Never  ? Smokeless tobacco: Never  ?Vaping Use  ? Vaping Use: Never used  ?Substance and Sexual Activity  ? Alcohol use: Yes  ?  Alcohol/week: 1.0 standard drink  ?  Types: 1 Glasses of wine per week  ?  Comment: occassionally  ? Drug use: No  ? Sexual activity: Not Currently  ?Other Topics Concern  ? Not on file  ?Social History Narrative  ? Married. Education: college. Pt does exercise-  ? ?Social Determinants of Health  ? ?Financial Resource Strain: Not on file  ?Food Insecurity: Not on file  ?Transportation Needs: Not on file  ?Physical  Activity: Not on file  ?Stress: Not on file  ?Social Connections: Not on file  ?Intimate Partner Violence: Not on file  ? ? ?FAMILY HISTORY: ?Family History  ?Problem Relation Age of Onset  ? Cancer Father   ? Heart disease Brother   ? Dementia Brother   ? Leukemia Brother   ? Breast cancer Neg Hx   ? Colon cancer Neg Hx   ? Esophageal cancer Neg Hx   ? Pancreatic cancer Neg Hx   ? Rectal cancer Neg Hx   ? Stomach cancer Neg Hx   ? ? ?ALLERGIES:  is allergic to poison ivy extract, plavix [clopidogrel bisulfate], and  other. ? ?MEDICATIONS:  ?Current Outpatient Medications  ?Medication Sig Dispense Refill  ? acetaminophen (TYLENOL 8 HOUR ARTHRITIS PAIN) 650 MG CR tablet Take 1 tablet (650 mg total) by mouth every 8 (eight) hours as needed for pain.    ? aspirin 81 MG tablet Take 81 mg by mouth daily.     ? atorvastatin (LIPITOR) 80 MG tablet TAKE 1 TABLET(80 MG) BY MOUTH DAILY AT 6 PM 90 tablet 1  ? benazepril (LOTENSIN) 10 MG tablet TAKE 1 TABLET(10 MG) BY MOUTH DAILY 90 tablet 1  ? cholecalciferol (VITAMIN D3) 25 MCG (1000 UNIT) tablet Take 2,000 Units by mouth daily.    ? DM-APAP-CPM (CORICIDIN HBP PO) Take by mouth.     ? famotidine-calcium carbonate-magnesium hydroxide (PEPCID COMPLETE) 10-800-165 MG chewable tablet Chew 1 tablet by mouth daily.    ? gabapentin (NEURONTIN) 100 MG capsule Take 1 capsule (100 mg total) by mouth 2 (two) times daily as needed. 180 capsule 1  ? lenalidomide (REVLIMID) 10 MG capsule TAKE 1 CAPSULE BY MOUTH DAILY  FOR 21 DAYS, THEN 7 DAYS OFF [employer] Auth #  [id number]  Date Obtained: 01/31/22 21 capsule 0  ? levothyroxine (SYNTHROID) 25 MCG tablet TAKE 1 TABLET(25 MCG) BY MOUTH DAILY BEFORE BREAKFAST 90 tablet 3  ? MAGNESIUM PO Take by mouth daily.    ? ondansetron (ZOFRAN) 8 MG tablet Take 1 tablet (8 mg total) by mouth every 8 (eight) hours as needed for nausea or vomiting. 30 tablet 0  ? ?No current facility-administered medications for this visit.  ? ? ?REVIEW OF SYSTEMS:   ?Constitutional: ( - ) fevers, ( - )  chills , ( - ) night sweats ?Eyes: ( - ) blurriness of vision, ( - ) double vision, ( - ) watery eyes ?Ears, nose, mouth, throat, and face: ( - ) mucositis, ( - ) sore throat ?Respiratory: ( - ) cough, ( - ) dyspnea, ( - ) wheezes ?Cardiovascular: ( - ) palpitation, ( - ) chest discomfort, ( - ) lower extremity swelling ?Gastrointestinal:  ( - ) nausea, ( - ) heartburn, ( - ) change in bowel habits ?Skin: ( - ) abnormal skin rashes ?Lymphatics: ( - ) new lymphadenopathy, ( - ) easy  bruising ?Neurological: ( +) numbness, ( - ) tingling, ( - ) new weaknesses ?Behavioral/Psych: ( - ) mood change, ( - ) new changes  ?All other systems were reviewed with the patient and are negative. ? ?PHYSICAL EXAMINATION: ?ECOG PERFORMANCE STATUS: 1 - Symptomatic but completely ambulatory ? ?Vitals:  ? 02/17/22 1257  ?BP: 123/75  ?Pulse: 69  ?Resp: 16  ?Temp: (!) 97.5 ?F (36.4 ?C)  ?SpO2: 100%  ? ? ?Filed Weights  ? 02/17/22 1257  ?Weight: 139 lb 6.4 oz (63.2 kg)  ? ? ?GENERAL: well appearing elderly Caucasian female  in NAD  ?SKIN: skin color, texture, turgor are normal, no rashes or significant lesions ?EYES: conjunctiva are pink and non-injected, sclera clear ?LUNGS: clear to auscultation and percussion with normal breathing effort ?HEART: regular rate & rhythm and no murmurs and trace lower extremity edema ?Musculoskeletal: no cyanosis of digits and no clubbing  ?PSYCH: alert & oriented x 3, fluent speech ?NEURO: no focal motor/sensory deficits ? ?LABORATORY DATA:  ?I have reviewed the data as listed ? ?  Latest Ref Rng & Units 02/17/2022  ? 12:32 PM 01/17/2022  ? 10:51 AM 12/20/2021  ? 10:37 AM  ?CBC  ?WBC 4.0 - 10.5 K/uL 5.1   5.0   4.7    ?Hemoglobin 12.0 - 15.0 g/dL 12.1   12.2   11.8    ?Hematocrit 36.0 - 46.0 % 37.0   38.6   36.6    ?Platelets 150 - 400 K/uL 229   278   248    ? ? ? ?  Latest Ref Rng & Units 02/17/2022  ? 12:32 PM 01/17/2022  ? 10:51 AM 12/20/2021  ? 10:37 AM  ?CMP  ?Glucose 70 - 99 mg/dL 89   81   93    ?BUN 8 - 23 mg/dL _0 ?Creatinine 0.44 - 1.00 mg/dL 0.78   0.78   0.73    ?Sodium 135 - 145 mmol/L 137   138   138    ?Potassium 3.5 - 5.1 mmol/L 3.7   4.3   4.3    ?Chloride 98 - 111 mmol/L 106   106   107    ?CO2 22 - 32 mmol/L _1 ?Calcium 8.9 - 10.3 mg/dL 9.5   10.3   9.8    ?Total Protein 6.5 - 8.1 g/dL 6.7   7.3   6.8    ?Total Bilirubin 0.3 - 1.2 mg/dL 0.6   0.8   0.8    ?Alkaline Phos 38 - 126 U/L 52   55   50    ?AST 15 - 41 U/L _2 ?ALT 0 - 44 U/L  _3 ? ? ?Lab Results  ?Component Value Date  ? MPROTEIN Not Observed 01/17/2022  ? MPROTEIN Not Observed 12/20/2021  ? MPROTEIN Not Observed 11/22/2021  ? ?Lab Results  ?Component Value Date  ? KPAFRELGTCHN 21

## 2022-02-18 ENCOUNTER — Encounter: Payer: Self-pay | Admitting: Hematology and Oncology

## 2022-02-18 LAB — KAPPA/LAMBDA LIGHT CHAINS
Kappa free light chain: 21.1 mg/L — ABNORMAL HIGH (ref 3.3–19.4)
Kappa, lambda light chain ratio: 0.67 (ref 0.26–1.65)
Lambda free light chains: 31.6 mg/L — ABNORMAL HIGH (ref 5.7–26.3)

## 2022-02-21 ENCOUNTER — Encounter: Payer: Self-pay | Admitting: Cardiology

## 2022-02-21 ENCOUNTER — Ambulatory Visit: Payer: Medicare Other | Admitting: Cardiology

## 2022-02-21 VITALS — BP 123/68 | HR 67 | Temp 97.8°F | Resp 18 | Ht 66.0 in | Wt 142.8 lb

## 2022-02-21 DIAGNOSIS — M48062 Spinal stenosis, lumbar region with neurogenic claudication: Secondary | ICD-10-CM | POA: Diagnosis not present

## 2022-02-21 DIAGNOSIS — I2510 Atherosclerotic heart disease of native coronary artery without angina pectoris: Secondary | ICD-10-CM | POA: Diagnosis not present

## 2022-02-21 LAB — MULTIPLE MYELOMA PANEL, SERUM
Albumin SerPl Elph-Mcnc: 3.9 g/dL (ref 2.9–4.4)
Albumin/Glob SerPl: 1.7 (ref 0.7–1.7)
Alpha 1: 0.2 g/dL (ref 0.0–0.4)
Alpha2 Glob SerPl Elph-Mcnc: 0.7 g/dL (ref 0.4–1.0)
B-Globulin SerPl Elph-Mcnc: 0.9 g/dL (ref 0.7–1.3)
Gamma Glob SerPl Elph-Mcnc: 0.7 g/dL (ref 0.4–1.8)
Globulin, Total: 2.4 g/dL (ref 2.2–3.9)
IgA: 159 mg/dL (ref 64–422)
IgG (Immunoglobin G), Serum: 787 mg/dL (ref 586–1602)
IgM (Immunoglobulin M), Srm: 21 mg/dL — ABNORMAL LOW (ref 26–217)
Total Protein ELP: 6.3 g/dL (ref 6.0–8.5)

## 2022-02-21 NOTE — Progress Notes (Signed)
? ?Primary Physician/Referring:  [name], MD ? ?Patient ID: Doris Wilkerson, female    DOB: 22-Mar-1946, 76 y.o.   MRN: [id number] ? ?Chief Complaint  ?Patient presents with  ? Coronary Artery Disease  ? PAD  ? Follow-up  ?  6 weeks  ? ?HPI:   ? ?Doris Wilkerson  is a 76 y.o. 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  Past medical history significant for hypertension, hyperlipidemia. 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 ? ?I had seen her about 6 weeks ago and with her symptoms of leg pain, I had suspected peripheral artery disease especially in view of abnormal physical exam, I had suspected right SFA disease.   ?No new symptomatology.  She has not had any chest pain. ? ?Past Medical History:  ?Diagnosis Date  ? Asthma   ? Cataract   ? GERD (gastroesophageal reflux disease)   ? Hyperlipidemia   ? Hypertension   ? Thyroid disease   ? ?Social History  ? ?Tobacco Use  ? Smoking status: Never  ? Smokeless tobacco: Never  ?Substance Use Topics  ? Alcohol use: Yes  ?  Alcohol/week: 1.0 standard drink  ?  Types: 1 Glasses of wine per week  ?  Comment: occassionally  ? ?Marital Status: Married  ?ROS  ?Review of Systems  ?Cardiovascular:  Positive for claudication. Negative for chest pain, dyspnea on exertion and leg swelling.  ?Objective  ?Blood pressure 123/68, pulse 67, temperature 97.8 ?F (36.6 ?C), temperature source Temporal, resp. rate 18, height '5\' 6"'  (1.676 m), weight 142 lb 12.8 oz (64.8 kg), SpO2 99 %. Body mass index is 23.05 kg/m?.  ? ?  02/21/2022  ? 11:26 AM 02/17/2022  ? 12:57 PM 01/17/2022  ? 10:58 AM  ?Vitals with BMI  ?Height '5\' 6"'   '5\' 6"'   ?Weight 142 lbs 13 oz 139 lbs 6 oz 141 lbs 10 oz  ?BMI 23.06  22.87  ?Systolic 433 295 188  ?Diastolic 68 75 84  ?Pulse 67 69 79  ?  ?Physical Exam ?Neck:  ?   Vascular:  No JVD.  ?Cardiovascular:  ?   Rate and Rhythm: Normal rate and regular rhythm.  ?   Pulses:     ?     Carotid pulses are 2+ on the right side and 2+ on the left side. ?     Femoral pulses are 1+ on the right side and 2+ on the left side. ?     Popliteal pulses are 0 on the right side and 2+ on the left side.  ?     Dorsalis pedis pulses are 0 on the right side and 1+ on the left side.  ?     Posterior tibial pulses are 0 on the right side and 1+ on the left side.  ?   Heart sounds: Normal heart sounds. No murmur heard. ?  No gallop.  ?Pulmonary:  ?   Effort: Pulmonary effort is normal.  ?   Breath sounds: Normal breath sounds.  ?Abdominal:  ?   General: Bowel sounds are normal.  ?   Palpations: Abdomen is soft.  ?Musculoskeletal:  ?   Right lower leg: No edema.  ?   Left lower leg: No edema.  ?  ? ?Medications and allergies  ? ?Allergies  ?Allergen Reactions  ? Poison [employer]  Extract Swelling  ? Plavix [Clopidogrel Bisulfate]   ? Other   ?  seasonal  ?  ? ?Medication list after today's encounter  ? ? ?Current Outpatient Medications:  ?  acetaminophen (TYLENOL 8 HOUR ARTHRITIS PAIN) 650 MG CR tablet, Take 1 tablet (650 mg total) by mouth every 8 (eight) hours as needed for pain., Disp: , Rfl:  ?  aspirin 81 MG tablet, Take 81 mg by mouth daily. , Disp: , Rfl:  ?  atorvastatin (LIPITOR) 80 MG tablet, TAKE 1 TABLET(80 MG) BY MOUTH DAILY AT 6 PM, Disp: 90 tablet, Rfl: 1 ?  benazepril (LOTENSIN) 10 MG tablet, TAKE 1 TABLET(10 MG) BY MOUTH DAILY, Disp: 90 tablet, Rfl: 1 ?  cholecalciferol (VITAMIN D3) 25 MCG (1000 UNIT) tablet, Take 2,000 Units by mouth daily., Disp: , Rfl:  ?  famotidine-calcium carbonate-magnesium hydroxide (PEPCID COMPLETE) 10-800-165 MG chewable tablet, Chew 1 tablet by mouth daily., Disp: , Rfl:  ?  gabapentin (NEURONTIN) 100 MG capsule, Take 1 capsule (100 mg total) by mouth 2 (two) times daily as needed., Disp: 180 capsule, Rfl: 1 ?  lenalidomide (REVLIMID) 10 MG capsule, TAKE 1 CAPSULE BY MOUTH DAILY   FOR 21 DAYS, THEN 7 DAYS OFF [employer] Auth #  [id number]  Date Obtained: 01/31/22, Disp: 21 capsule, Rfl: 0 ?  levothyroxine (SYNTHROID) 25 MCG tablet, TAKE 1 TABLET(25 MCG) BY MOUTH DAILY BEFORE BREAKFAST, Disp: 90 tablet, Rfl: 3 ?  MAGNESIUM PO, Take by mouth daily., Disp: , Rfl:  ? ?Laboratory examination:  ? ?Recent Labs  ?  12/20/21 ?1037 01/17/22 ?1051 02/17/22 ?1232  ?NA 138 138 137  ?K 4.3 4.3 3.7  ?CL 107 106 106  ?CO2 '26 28 27  ' ?GLUCOSE 93 81 89  ?BUN '13 13 13  ' ?CREATININE 0.73 0.78 0.78  ?CALCIUM 9.8 10.3 9.5  ?GFRNONAA >60 >60 >60  ? ?estimated creatinine clearance is 56.9 mL/min (by C-G formula based on SCr of 0.78 mg/dL).  ? ?  Latest Ref Rng & Units 02/17/2022  ? 12:32 PM 01/17/2022  ? 10:51 AM 12/20/2021  ? 10:37 AM  ?CMP  ?Glucose 70 - 99 mg/dL 89   81   93    ?BUN 8 - 23 mg/dL '13   13   13    ' ?Creatinine 0.44 - 1.00 mg/dL 0.78   0.78   0.73    ?Sodium 135 - 145 mmol/L 137   138   138    ?Potassium 3.5 - 5.1 mmol/L 3.7   4.3   4.3    ?Chloride 98 - 111 mmol/L 106   106   107    ?CO2 22 - 32 mmol/L '27   28   26    ' ?Calcium 8.9 - 10.3 mg/dL 9.5   10.3   9.8    ?Total Protein 6.5 - 8.1 g/dL 6.7   7.3   6.8    ?Total Bilirubin 0.3 - 1.2 mg/dL 0.6   0.8   0.8    ?Alkaline Phos 38 - 126 U/L 52   55   50    ?AST 15 - 41 U/L '22   27   21    ' ?ALT 0 - 44 U/L '21   26   18    ' ? ? ?  Latest Ref Rng & Units 02/17/2022  ? 12:32 PM 01/17/2022  ? 10:51 AM 12/20/2021  ? 10:37 AM  ?CBC  ?WBC 4.0 - 10.5 K/uL 5.1   5.0   4.7    ?  Hemoglobin 12.0 - 15.0 g/dL 12.1   12.2   11.8    ?Hematocrit 36.0 - 46.0 % 37.0   38.6   36.6    ?Platelets 150 - 400 K/uL 229   278   248    ? ? ?Lipid Panel ?Recent Labs  ?  05/03/21 ?0000 12/10/21 ?0727  ?CHOL 163 136  ?TRIG 85 65.0  ?Stuart 69 62  ?VLDL  --  13.0  ?HDL 78 61.20  ?CHOLHDL 2.1 2  ? ?HEMOGLOBIN A1C ?No results found for: HGBA1C, MPG ?TSH ?Recent Labs  ?  04/26/21 ?0951 12/10/21 ?4628  ?TSH 0.735 1.01  ? ?Radiology:  ? ? ? ?Cardiac Studies:  ? ?Coronary angiogram 11/28/2002: Proximal  LAD stent: 3.0x18 Cypher. ?  ?Lower Extremity Arterial Duplex 02/05/2022: ?No hemodynamically significant stenoses are identified in the bilateral lower extremity arterial system.  ?This exam reveals normal perfusion of the right lower extremity (ABI 1.21) and normal perfusion of the left lower extremity (ABI 1.17) with normal triphasic waveform at the level of the ankles. ?Evaluate for pseudoclaudication.  ? ?PCV ECHOCARDIOGRAM COMPLETE 02/05/2022  ?Normal LV systolic function with EF 74%. Left ventricle cavity is normal in size. Normal left ventricular wall thickness. Normal global wall motion. Normal diastolic filling pattern, normal LAP. ?Trace aortic regurgitation. ?No significant valvular heart disease. ?Compared to study 04/30/2011 no significant change. ?  ?PCV MYOCARDIAL PERFUSION WO LEXISCAN 02/03/2022  ?Lexiscan (with Mod Bruce protocol) Nuclear stress test 02/03/2022: ?Normal ECG stress. The patient was injected with 0.4 mg of Intravenous Lexiscan over 15 sec. Additionally, patient exercised on a Modified Bruce protocol. Achieved 100% MPHR. ?Myocardial perfusion is normal. ?Overall LV systolic function is normal without regional wall motion abnormalities. Stress LV EF: 51%. ?No previous exam available for comparison. Low risk. ?  ? ?EKG:  ? ?EKG 01/10/2022: Normal sinus rhythm with rate of 63 bpm, left axis deviation, left anterior fascicular block.  Incomplete right bundle branch block.  LVH.   ? ?Assessment  ? ?  ICD-10-CM   ?1. Coronary artery disease involving native coronary artery of native heart without angina pectoris  I25.10   ?  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  ?  ? ?Medications Discontinued During This Encounter  ?Medication Reason  ? DM-APAP-CPM (CORICIDIN HBP PO)   ? ondansetron (ZOFRAN) 8 MG tablet   ?  ?No orders of the defined types were placed in this encounter. ? ?No orders of the defined types were placed in this encounter. ? ?Recommendations:  ? ?Doris Wilkerson is a 76  y.o.  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  Past medical history significant for hypertension, hyperlipidemi

## 2022-02-26 ENCOUNTER — Other Ambulatory Visit: Payer: Self-pay | Admitting: *Deleted

## 2022-02-26 MED ORDER — LENALIDOMIDE 10 MG PO CAPS: ORAL_CAPSULE | ORAL | 0 refills | Status: DC

## 2022-03-02 ENCOUNTER — Other Ambulatory Visit: Payer: Self-pay | Admitting: Hematology and Oncology

## 2022-03-13 ENCOUNTER — Other Ambulatory Visit: Payer: Self-pay

## 2022-03-13 ENCOUNTER — Other Ambulatory Visit: Payer: Self-pay | Admitting: *Deleted

## 2022-03-13 DIAGNOSIS — M549 Dorsalgia, unspecified: Secondary | ICD-10-CM | POA: Insufficient documentation

## 2022-03-13 DIAGNOSIS — C9000 Multiple myeloma not having achieved remission: Secondary | ICD-10-CM | POA: Insufficient documentation

## 2022-03-13 DIAGNOSIS — Z7982 Long term (current) use of aspirin: Secondary | ICD-10-CM | POA: Insufficient documentation

## 2022-03-13 DIAGNOSIS — C9001 Multiple myeloma in remission: Secondary | ICD-10-CM

## 2022-03-13 DIAGNOSIS — Z7961 Long term (current) use of immunomodulator: Secondary | ICD-10-CM | POA: Insufficient documentation

## 2022-03-13 DIAGNOSIS — Z79899 Other long term (current) drug therapy: Secondary | ICD-10-CM | POA: Insufficient documentation

## 2022-03-14 ENCOUNTER — Inpatient Hospital Stay (HOSPITAL_BASED_OUTPATIENT_CLINIC_OR_DEPARTMENT_OTHER): Payer: Medicare Other | Admitting: Hematology and Oncology

## 2022-03-14 ENCOUNTER — Other Ambulatory Visit: Payer: Self-pay

## 2022-03-14 ENCOUNTER — Inpatient Hospital Stay: Payer: Medicare Other | Attending: Physician Assistant

## 2022-03-14 ENCOUNTER — Other Ambulatory Visit: Payer: Self-pay | Admitting: Hematology and Oncology

## 2022-03-14 ENCOUNTER — Inpatient Hospital Stay: Payer: Medicare Other

## 2022-03-14 VITALS — BP 140/60 | HR 67 | Temp 97.8°F | Resp 18 | Wt 142.5 lb

## 2022-03-14 DIAGNOSIS — T451X5A Adverse effect of antineoplastic and immunosuppressive drugs, initial encounter: Secondary | ICD-10-CM

## 2022-03-14 DIAGNOSIS — Z79899 Other long term (current) drug therapy: Secondary | ICD-10-CM | POA: Insufficient documentation

## 2022-03-14 DIAGNOSIS — M549 Dorsalgia, unspecified: Secondary | ICD-10-CM | POA: Insufficient documentation

## 2022-03-14 DIAGNOSIS — M899 Disorder of bone, unspecified: Secondary | ICD-10-CM | POA: Diagnosis not present

## 2022-03-14 DIAGNOSIS — C9001 Multiple myeloma in remission: Secondary | ICD-10-CM

## 2022-03-14 DIAGNOSIS — D6481 Anemia due to antineoplastic chemotherapy: Secondary | ICD-10-CM | POA: Diagnosis not present

## 2022-03-14 DIAGNOSIS — Z7982 Long term (current) use of aspirin: Secondary | ICD-10-CM | POA: Insufficient documentation

## 2022-03-14 DIAGNOSIS — Z7961 Long term (current) use of immunomodulator: Secondary | ICD-10-CM | POA: Insufficient documentation

## 2022-03-14 DIAGNOSIS — C9000 Multiple myeloma not having achieved remission: Secondary | ICD-10-CM | POA: Insufficient documentation

## 2022-03-14 LAB — CBC WITH DIFFERENTIAL (CANCER CENTER ONLY)
Abs Immature Granulocytes: 0 10*3/uL (ref 0.00–0.07)
Basophils Absolute: 0.1 10*3/uL (ref 0.0–0.1)
Basophils Relative: 2 %
Eosinophils Absolute: 0.5 10*3/uL (ref 0.0–0.5)
Eosinophils Relative: 9 %
HCT: 35.7 % — ABNORMAL LOW (ref 36.0–46.0)
Hemoglobin: 11.6 g/dL — ABNORMAL LOW (ref 12.0–15.0)
Immature Granulocytes: 0 %
Lymphocytes Relative: 28 %
Lymphs Abs: 1.5 10*3/uL (ref 0.7–4.0)
MCH: 28.6 pg (ref 26.0–34.0)
MCHC: 32.5 g/dL (ref 30.0–36.0)
MCV: 88.1 fL (ref 80.0–100.0)
Monocytes Absolute: 0.9 10*3/uL (ref 0.1–1.0)
Monocytes Relative: 17 %
Neutro Abs: 2.4 10*3/uL (ref 1.7–7.7)
Neutrophils Relative %: 44 %
Platelet Count: 207 10*3/uL (ref 150–400)
RBC: 4.05 MIL/uL (ref 3.87–5.11)
RDW: 14.6 % (ref 11.5–15.5)
WBC Count: 5.4 10*3/uL (ref 4.0–10.5)
nRBC: 0 % (ref 0.0–0.2)

## 2022-03-14 LAB — CMP (CANCER CENTER ONLY)
ALT: 26 U/L (ref 0–44)
AST: 26 U/L (ref 15–41)
Albumin: 4 g/dL (ref 3.5–5.0)
Alkaline Phosphatase: 53 U/L (ref 38–126)
Anion gap: 6 (ref 5–15)
BUN: 14 mg/dL (ref 8–23)
CO2: 23 mmol/L (ref 22–32)
Calcium: 9.5 mg/dL (ref 8.9–10.3)
Chloride: 110 mmol/L (ref 98–111)
Creatinine: 0.75 mg/dL (ref 0.44–1.00)
GFR, Estimated: >60 mL/min (ref >60)
Glucose, Bld: 97 mg/dL (ref 70–99)
Potassium: 4.3 mmol/L (ref 3.5–5.1)
Sodium: 139 mmol/L (ref 135–145)
Total Bilirubin: 0.6 mg/dL (ref 0.3–1.2)
Total Protein: 6.4 g/dL — ABNORMAL LOW (ref 6.5–8.1)

## 2022-03-14 LAB — LACTATE DEHYDROGENASE: LDH: 165 U/L (ref 98–192)

## 2022-03-14 MED ORDER — SODIUM CHLORIDE 0.9 % IV SOLN: Freq: Once | INTRAVENOUS | Status: AC

## 2022-03-14 MED ORDER — ZOLEDRONIC ACID 4 MG/100ML IV SOLN
4.0000 mg | Freq: Once | INTRAVENOUS | Status: AC
  Administered 2022-03-14: 4 mg via INTRAVENOUS
  Filled 2022-03-14: qty 100

## 2022-03-14 NOTE — Patient Instructions (Signed)

## 2022-03-14 NOTE — Progress Notes (Signed)
?[place] ?[phone]   [phone] ? ?PROGRESS NOTE ? ?Patient Care Team: ?[name], MD as PCP - General (Family Medicine) ?[name], MD as Consulting Physician (Cardiology) ?[name], MD (Ophthalmology) ? ?Hematological/Oncological History ?# Lambda Light Chain Multiple Myeloma ?12/26/2020: MRI of pelvis showed lytic lesions on L4, L5, the sacrum, with pathologic fracture of left inferior pubic ramus. Concerning for multiple myeloma vs metastatic disease ?12/31/2020: establish care with Dr. Lorenso Courier. UPEP showed marked Bence Jones protein, findings concerning for multiple myleoma ?01/21/2021: bone marrow biopsy confirms multiple myeloma with atypical plasma cells representing 28% of all cells in the aspirate ? 02/01/2021: Cycle 1 Day 1 of VRd chemotherapy ?02/22/2021: Cycle 2 Day 1 of VRd chemotherapy ?03/15/2021: Cycle 3 Day 1 of VRd chemotherapy ?04/05/2021: Cycle 4 Day 1 of VRd chemotherapy ?04/26/2021: Cycle 5 Day 1 of VRd chemotherapy ?05/17/2021: Cycle 6 Day 1 of VRd chemotherapy ?06/07/2021:  Cycle 7 Day 1 of VRd chemotherapy ?06/28/2021: Cycle 8 Day 1 of VRd chemotherapy ?07/19/2021: start maintenance revlimid 10m PO daily.  ? ?Interval History:  ?Doris [id number].o. female with medical history significant for lambda light chain multiple myeloma who presents for a follow up visit. The patient's last visit was on 02/17/2022. In the interim since she has continued on her revlimid without difficulty.  ? ?On exam today Mrs. Colcord reports she has been well overall in the interim since her last visit.  She notes that she does occasionally have loose stools typically date 12-15 during her Revlimid maintenance cycle.  She reports that it tends to be relieved because she has a slow bowels the rest of the time.  She has had good energy lately though she is somewhat nervous about chronic pain she has been having in her back.  She reports it has been migrating towards the spine  and she is anxious about this.  She reports that has been given Penton and Tylenol for the pain with good control.  She notes otherwise her appetite has been good she recent underwent a cardiac evaluation.  She denies fevers, chills, night sweats, shortness of breath, chest pain or cough. She hsa no other complaints. A full 10 point ROS is listed below.  ? ?MEDICAL HISTORY:  ?Past Medical History:  ?Diagnosis Date  ? Asthma   ? Cataract   ? GERD (gastroesophageal reflux disease)   ? Hyperlipidemia   ? Hypertension   ? Thyroid disease   ? ? ?SURGICAL HISTORY: ?Past Surgical History:  ?Procedure Laterality Date  ? Cataract Surgery    ? CORONARY ANGIOPLASTY WITH STENT PLACEMENT    ? EYE SURGERY    ? ORTHOPEDIC SURGERY  2012  ? TONSILLECTOMY  1951  ? ? ?SOCIAL HISTORY: ?Social History  ? ?Socioeconomic History  ? Marital status: Married  ?  Spouse name: Not on file  ? Number of children: 0  ? Years of education: Not on file  ? Highest education level: Not on file  ?Occupational History  ? Occupation: unemployed  ?Tobacco Use  ? Smoking status: Never  ? Smokeless tobacco: Never  ?Vaping Use  ? Vaping Use: Never used  ?Substance and Sexual Activity  ? Alcohol use: Yes  ?  Alcohol/week: 1.0 standard drink  ?  Types: 1 Glasses of wine per week  ?  Comment: occassionally  ? Drug use: No  ? Sexual activity: Not Currently  ?Other Topics Concern  ? Not on file  ?Social History Narrative  ?  Married. Education: college. Pt does exercise-  ? ?Social Determinants of Health  ? ?Financial Resource Strain: Not on file  ?Food Insecurity: Not on file  ?Transportation Needs: Not on file  ?Physical Activity: Not on file  ?Stress: Not on file  ?Social Connections: Not on file  ?Intimate Partner Violence: Not on file  ? ? ?FAMILY HISTORY: ?Family History  ?Problem Relation Age of Onset  ? Cancer Father   ? Heart disease Brother   ? Dementia Brother   ? Leukemia Brother   ? Breast cancer Neg Hx   ? Colon cancer Neg Hx   ? Esophageal cancer  Neg Hx   ? Pancreatic cancer Neg Hx   ? Rectal cancer Neg Hx   ? Stomach cancer Neg Hx   ? ? ?ALLERGIES:  is allergic to poison ivy extract, plavix [clopidogrel bisulfate], and other. ? ?MEDICATIONS:  ?Current Outpatient Medications  ?Medication Sig Dispense Refill  ? acetaminophen (TYLENOL 8 HOUR ARTHRITIS PAIN) 650 MG CR tablet Take 1 tablet (650 mg total) by mouth every 8 (eight) hours as needed for pain.    ? aspirin 81 MG tablet Take 81 mg by mouth daily.     ? atorvastatin (LIPITOR) 80 MG tablet TAKE 1 TABLET(80 MG) BY MOUTH DAILY AT 6 PM 90 tablet 1  ? benazepril (LOTENSIN) 10 MG tablet TAKE 1 TABLET(10 MG) BY MOUTH DAILY 90 tablet 1  ? cholecalciferol (VITAMIN D3) 25 MCG (1000 UNIT) tablet Take 2,000 Units by mouth daily.    ? famotidine-calcium carbonate-magnesium hydroxide (PEPCID COMPLETE) 10-800-165 MG chewable tablet Chew 1 tablet by mouth daily.    ? gabapentin (NEURONTIN) 100 MG capsule Take 1 capsule (100 mg total) by mouth 2 (two) times daily as needed. 180 capsule 1  ? lenalidomide (REVLIMID) 10 MG capsule TAKE 1 CAPSULE BY MOUTH DAILY  FOR 21 DAYS, THEN 7 DAYS OFF [employer] Auth #  [id number]  Date Obtained: 02/26/22 21 capsule 0  ? levothyroxine (SYNTHROID) 25 MCG tablet TAKE 1 TABLET(25 MCG) BY MOUTH DAILY BEFORE BREAKFAST 90 tablet 3  ? MAGNESIUM PO Take 250 mg by mouth daily.    ? ?No current facility-administered medications for this visit.  ? ? ?REVIEW OF SYSTEMS:   ?Constitutional: ( - ) fevers, ( - )  chills , ( - ) night sweats ?Eyes: ( - ) blurriness of vision, ( - ) double vision, ( - ) watery eyes ?Ears, nose, mouth, throat, and face: ( - ) mucositis, ( - ) sore throat ?Respiratory: ( - ) cough, ( - ) dyspnea, ( - ) wheezes ?Cardiovascular: ( - ) palpitation, ( - ) chest discomfort, ( - ) lower extremity swelling ?Gastrointestinal:  ( - ) nausea, ( - ) heartburn, ( - ) change in bowel habits ?Skin: ( - ) abnormal skin rashes ?Lymphatics: ( - ) new lymphadenopathy, ( - ) easy  bruising ?Neurological: ( +) numbness, ( - ) tingling, ( - ) new weaknesses ?Behavioral/Psych: ( - ) mood change, ( - ) new changes  ?All other systems were reviewed with the patient and are negative. ? ?PHYSICAL EXAMINATION: ?ECOG PERFORMANCE STATUS: 1 - Symptomatic but completely ambulatory ? ?Vitals:  ? 03/14/22 1200  ?BP: 140/60  ?Pulse: 67  ?Resp: 18  ?Temp: 97.8 ?F (36.6 ?C)  ?SpO2: 100%  ? ? ? ?Filed Weights  ? 03/14/22 1200  ?Weight: 142 lb 8 oz (64.6 kg)  ? ? ? ?GENERAL: well appearing elderly Caucasian female in NAD  ?  SKIN: skin color, texture, turgor are normal, no rashes or significant lesions ?EYES: conjunctiva are pink and non-injected, sclera clear ?LUNGS: clear to auscultation and percussion with normal breathing effort ?HEART: regular rate & rhythm and no murmurs and trace lower extremity edema ?Musculoskeletal: no cyanosis of digits and no clubbing  ?PSYCH: alert & oriented x 3, fluent speech ?NEURO: no focal motor/sensory deficits ? ?LABORATORY DATA:  ?I have reviewed the data as listed ? ?  Latest Ref Rng & Units 03/14/2022  ? 11:11 AM 02/17/2022  ? 12:32 PM 01/17/2022  ? 10:51 AM  ?CBC  ?WBC 4.0 - 10.5 K/uL 5.4   5.1   5.0    ?Hemoglobin 12.0 - 15.0 g/dL 11.6   12.1   12.2    ?Hematocrit 36.0 - 46.0 % 35.7   37.0   38.6    ?Platelets 150 - 400 K/uL 207   229   278    ? ? ? ?  Latest Ref Rng & Units 03/14/2022  ? 11:11 AM 02/17/2022  ? 12:32 PM 01/17/2022  ? 10:51 AM  ?CMP  ?Glucose 70 - 99 mg/dL 97   89   81    ?BUN 8 - 23 mg/dL '14   13   13    ' ?Creatinine 0.44 - 1.00 mg/dL 0.75   0.78   0.78    ?Sodium 135 - 145 mmol/L 139   137   138    ?Potassium 3.5 - 5.1 mmol/L 4.3   3.7   4.3    ?Chloride 98 - 111 mmol/L 110   106   106    ?CO2 22 - 32 mmol/L '23   27   28    ' ?Calcium 8.9 - 10.3 mg/dL 9.5   9.5   10.3    ?Total Protein 6.5 - 8.1 g/dL 6.4   6.7   7.3    ?Total Bilirubin 0.3 - 1.2 mg/dL 0.6   0.6   0.8    ?Alkaline Phos 38 - 126 U/L 53   52   55    ?AST 15 - 41 U/L '26   22   27    ' ?ALT 0 - 44 U/L  '26   21   26    ' ? ? ?Lab Results  ?Component Value Date  ? MPROTEIN Not Observed 02/17/2022  ? MPROTEIN Not Observed 01/17/2022  ? MPROTEIN Not Observed 12/20/2021  ? ?Lab Results  ?Component Value Date  ? KPAFRELGTCH

## 2022-03-15 LAB — BETA 2 MICROGLOBULIN, SERUM: Beta-2 Microglobulin: 1.9 mg/L (ref 0.6–2.4)

## 2022-03-17 LAB — UPEP/UIFE/LIGHT CHAINS/TP, 24-HR UR
% BETA, Urine: 0 %
ALPHA 1 URINE: 0 %
Albumin, U: 100 %
Alpha 2, Urine: 0 %
Free Kappa Lt Chains,Ur: 7.67 mg/L (ref 1.17–86.46)
Free Kappa/Lambda Ratio: 9.13 (ref 1.83–14.26)
Free Lambda Lt Chains,Ur: 0.84 mg/L (ref 0.27–15.21)
GAMMA GLOBULIN URINE: 0 %
Total Protein, Urine-Ur/day: <112 mg/24 hr (ref 30–150)
Total Protein, Urine: <4 mg/dL
Total Volume: 2800

## 2022-03-17 LAB — MULTIPLE MYELOMA PANEL, SERUM
Albumin SerPl Elph-Mcnc: 3.7 g/dL (ref 2.9–4.4)
Albumin/Glob SerPl: 1.7 (ref 0.7–1.7)
Alpha 1: 0.2 g/dL (ref 0.0–0.4)
Alpha2 Glob SerPl Elph-Mcnc: 0.6 g/dL (ref 0.4–1.0)
B-Globulin SerPl Elph-Mcnc: 0.8 g/dL (ref 0.7–1.3)
Gamma Glob SerPl Elph-Mcnc: 0.7 g/dL (ref 0.4–1.8)
Globulin, Total: 2.3 g/dL (ref 2.2–3.9)
IgA: 138 mg/dL (ref 64–422)
IgG (Immunoglobin G), Serum: 708 mg/dL (ref 586–1602)
IgM (Immunoglobulin M), Srm: 19 mg/dL — ABNORMAL LOW (ref 26–217)
Total Protein ELP: 6 g/dL (ref 6.0–8.5)

## 2022-03-17 LAB — KAPPA/LAMBDA LIGHT CHAINS
Kappa free light chain: 22.2 mg/L — ABNORMAL HIGH (ref 3.3–19.4)
Kappa, lambda light chain ratio: 0.71 (ref 0.26–1.65)
Lambda free light chains: 31.2 mg/L — ABNORMAL HIGH (ref 5.7–26.3)

## 2022-03-27 ENCOUNTER — Ambulatory Visit (HOSPITAL_COMMUNITY)
Admission: RE | Admit: 2022-03-27 | Discharge: 2022-03-27 | Disposition: A | Discharge status: Home / Self Care | Payer: Medicare Other | Source: Ambulatory Visit | Attending: Hematology and Oncology | Admitting: Hematology and Oncology

## 2022-03-27 ENCOUNTER — Other Ambulatory Visit: Payer: Self-pay | Admitting: *Deleted

## 2022-03-27 DIAGNOSIS — M899 Disorder of bone, unspecified: Secondary | ICD-10-CM | POA: Insufficient documentation

## 2022-03-27 DIAGNOSIS — M5136 Other intervertebral disc degeneration, lumbar region: Secondary | ICD-10-CM | POA: Diagnosis not present

## 2022-03-27 DIAGNOSIS — M549 Dorsalgia, unspecified: Secondary | ICD-10-CM | POA: Diagnosis not present

## 2022-03-27 MED ORDER — LENALIDOMIDE 10 MG PO CAPS: ORAL_CAPSULE | ORAL | 0 refills | Status: DC

## 2022-03-27 MED ORDER — SODIUM CHLORIDE (PF) 0.9 % IJ SOLN
INTRAMUSCULAR | Status: AC
  Filled 2022-03-27: qty 50

## 2022-03-27 MED ORDER — IOHEXOL 300 MG/ML  SOLN
100.0000 mL | Freq: Once | INTRAMUSCULAR | Status: AC | PRN
  Administered 2022-03-27: 100 mL via INTRAVENOUS

## 2022-03-28 ENCOUNTER — Encounter: Payer: Self-pay | Admitting: Hematology and Oncology

## 2022-04-03 ENCOUNTER — Ambulatory Visit (INDEPENDENT_AMBULATORY_CARE_PROVIDER_SITE_OTHER): Payer: Medicare Other | Admitting: *Deleted

## 2022-04-03 ENCOUNTER — Ambulatory Visit: Payer: Medicare Other

## 2022-04-03 DIAGNOSIS — Z0000 Encounter for general adult medical examination without abnormal findings: Secondary | ICD-10-CM | POA: Diagnosis not present

## 2022-04-03 NOTE — Progress Notes (Signed)
? ?Subjective:  ? Doris Wilkerson is a 76 y.o. female who presents for Medicare Annual (Subsequent) preventive examination. ? ?I connected with  [name] on 04/03/22 by a telephone enabled telemedicine application and verified that I am speaking with the correct person using two identifiers. ?  ?I discussed the limitations of evaluation and management by telemedicine. The patient expressed understanding and agreed to proceed. ? ?Patient location: home ? ?Provider location:    Tele-Health-Home ? ? ? ?Review of Systems    ? ?Cardiac Risk Factors include: advanced age (>51mn, >>72women);family history of premature cardiovascular disease (multiple myeloma) ? ?   ?Objective:  ?  ?Today's Vitals  ? ?There is no height or weight on file to calculate BMI. ? ? ?  04/03/2022  ?  8:54 AM 06/28/2021  ? 10:03 AM 06/07/2021  ? 11:05 AM 05/24/2021  ?  9:39 AM 05/17/2021  ? 11:32 AM 03/26/2021  ?  2:15 PM 03/21/2021  ?  9:22 AM  ?Advanced Directives  ?Does Patient Have a Medical Advance Directive? Yes Yes Yes No Yes Yes Yes  ?Type of AIndustrial/product designerof AB[place]ide Gardensof AEvergreenof ARed LevelLiving will HGilt EdgeLiving will  ?Does patient want to make changes to medical advance directive?    No - Patient declined     ?Copy of [place] Chart? Yes - validated most recent copy scanned in chart (See row information) Yes - validated most recent copy scanned in chart (See row information)   Yes - validated most recent copy scanned in chart (See row information)  No - copy requested  ? ? ?Current Medications (verified) ?Outpatient Encounter Medications as of 04/03/2022  ?Medication Sig  ? acetaminophen (TYLENOL) 500 MG tablet Take 500 mg by mouth every 6 (six) hours as needed for mild pain. Take 3 times daily  ? aspirin 81 MG tablet Take 81 mg by mouth daily.   ? atorvastatin (LIPITOR) 80 MG  tablet TAKE 1 TABLET(80 MG) BY MOUTH DAILY AT 6 PM  ? benazepril (LOTENSIN) 10 MG tablet TAKE 1 TABLET(10 MG) BY MOUTH DAILY  ? calcium carbonate (TUMS - DOSED IN MG ELEMENTAL CALCIUM) 500 MG chewable tablet Chew 1 tablet by mouth daily. daily  ? cholecalciferol (VITAMIN D3) 25 MCG (1000 UNIT) tablet Take 2,000 Units by mouth daily.  ? gabapentin (NEURONTIN) 100 MG capsule Take 1 capsule (100 mg total) by mouth 2 (two) times daily as needed.  ? lenalidomide (REVLIMID) 10 MG capsule TAKE 1 CAPSULE BY MOUTH DAILY  FOR 21 DAYS, THEN 7 DAYS OFF [employer] Auth #   [id number] Obtained: 03/27/22  ? levothyroxine (SYNTHROID) 25 MCG tablet TAKE 1 TABLET(25 MCG) BY MOUTH DAILY BEFORE BREAKFAST  ? MAGNESIUM PO Take 250 mg by mouth daily.  ? Zoledronic Acid (ZOMETA IV) Inject into the vein. Every 3 months  ? acetaminophen (TYLENOL 8 HOUR ARTHRITIS PAIN) 650 MG CR tablet Take 1 tablet (650 mg total) by mouth every 8 (eight) hours as needed for pain. (Patient not taking: Reported on 04/03/2022)  ? famotidine-calcium carbonate-magnesium hydroxide (PEPCID COMPLETE) 10-800-165 MG chewable tablet Chew 1 tablet by mouth daily. (Patient not taking: Reported on 04/03/2022)  ? ?No facility-administered encounter medications on file as of 04/03/2022.  ? ? ?Allergies (verified) ?Poison ivy extract, Plavix [clopidogrel bisulfate], and Other  ? ?History: ?Past Medical History:  ?Diagnosis Date  ?  Asthma   ? Cataract   ? GERD (gastroesophageal reflux disease)   ? Hyperlipidemia   ? Hypertension   ? Thyroid disease   ? ?Past Surgical History:  ?Procedure Laterality Date  ? Cataract Surgery    ? CORONARY ANGIOPLASTY WITH STENT PLACEMENT    ? EYE SURGERY    ? ORTHOPEDIC SURGERY  2012  ? TONSILLECTOMY  1951  ? ?Family History  ?Problem Relation Age of Onset  ? Cancer Father   ? Heart disease Brother   ? Dementia Brother   ? Leukemia Brother   ? Breast cancer Neg Hx   ? Colon cancer Neg Hx   ? Esophageal cancer Neg Hx   ? Pancreatic cancer Neg Hx   ?  Rectal cancer Neg Hx   ? Stomach cancer Neg Hx   ? ?Social History  ? ?Socioeconomic History  ? Marital status: Married  ?  Spouse name: Not on file  ? Number of children: 0  ? Years of education: Not on file  ? Highest education level: Not on file  ?Occupational History  ? Occupation: unemployed  ?Tobacco Use  ? Smoking status: Never  ? Smokeless tobacco: Never  ?Vaping Use  ? Vaping Use: Never used  ?Substance and Sexual Activity  ? Alcohol use: Yes  ?  Alcohol/week: 1.0 standard drink  ?  Types: 1 Glasses of wine per week  ?  Comment: occassionally  ? Drug use: No  ? Sexual activity: Not Currently  ?Other Topics Concern  ? Not on file  ?Social History Narrative  ? Married. Education: college. Pt does exercise-  ? ?Social Determinants of Health  ? ?Financial Resource Strain: Low Risk   ? Difficulty of Paying Living Expenses: Not hard at all  ?Food Insecurity: No Food Insecurity  ? Worried About Charity fundraiser in the Last Year: Never true  ? Ran Out of Food in the Last Year: Never true  ?Transportation Needs: No Transportation Needs  ? Lack of Transportation (Medical): No  ? Lack of Transportation (Non-Medical): No  ?Physical Activity: Sufficiently Active  ? Days of Exercise per Week: 3 days  ? Minutes of Exercise per Session: 50 min  ?Stress: No Stress Concern Present  ? Feeling of Stress : Not at all  ?Social Connections: Moderately Integrated  ? Frequency of Communication with Friends and Family: More than three times a week  ? Frequency of Social Gatherings with Friends and Family: More than three times a week  ? Attends Religious Services: Never  ? Active Member of Clubs or Organizations: Yes  ? Attends Archivist Meetings: More than 4 times per year  ? Marital Status: Married  ? ? ?Tobacco Counseling ?Counseling given: Not Answered ? ? ?Clinical Intake: ? ?Pre-visit preparation completed: Yes ? ?Pain : 0-10 ?Pain Score:  (stabbing/ then goes away) ?Pain Type: Chronic pain ?Pain Location:  Back ?Pain Descriptors / Indicators: Shooting ?Pain Onset: More than a month ago ?Pain Frequency: Intermittent ?Pain Relieving Factors: tylenol ? ?Pain Relieving Factors: tylenol ? ?Nutritional Risks: None ?Diabetes: No ? ?How often do you need to have someone help you when you read instructions, pamphlets, or other written materials from your doctor or pharmacy?: 1 - Never ? ?Diabetic?   no ? ?Interpreter Needed?: No ? ?Information entered by :: Leroy Kennedy LPN ? ? ?Activities of Daily Living ? ?  04/03/2022  ?  9:09 AM  ?In your present state of health, do you have any difficulty  performing the following activities:  ?Hearing? 0  ?Vision? 0  ?Difficulty concentrating or making decisions? 0  ?Walking or climbing stairs? 0  ?Dressing or bathing? 0  ?Doing errands, shopping? 0  ?Preparing Food and eating ? N  ?Using the Toilet? N  ?In the past six months, have you accidently leaked urine? N  ?Do you have problems with loss of bowel control? N  ?Managing your Medications? N  ?Managing your Finances? N  ?Housekeeping or managing your Housekeeping? N  ? ? ?Patient Care Team: ?[name], MD as PCP - General (Family Medicine) ?[name], MD as Consulting Physician (Cardiology) ?[name], MD (Ophthalmology) ? ?Indicate any recent Medical Services you may have received from other than [facility] providers in the past year (date may be approximate). ? ?   ?Assessment:  ? This is a routine wellness examination for Shirel. ? ?Hearing/Vision screen ?Hearing Screening - Comments:: No trouble hearing ?Vision Screening - Comments:: Up to date ?[facility] ? ?Dietary issues and exercise activities discussed: ?Current Exercise Habits: Home exercise routine, Type of exercise: walking (yard work, gardening), Time (Minutes): 30, Frequency (Times/Week): 3, Weekly Exercise (Minutes/Week): 90, Intensity: Mild ? ? Goals Addressed   ? ?  ?  ?  ?  ? This Visit's Progress  ?  Patient Stated   On track  ?  Regain  mobility and stability on knees ? ?  ?  Patient Stated     ?  Would like to work on muscle strength ?  ? ?  ? ?Depression Screen ? ?  01/02/2022  ?  1:41 PM 11/21/2021  ? 10:04 AM 12/20/2020  ?  3:49 PM 08/03/2020

## 2022-04-03 NOTE — Patient Instructions (Signed)
Doris Wilkerson , ?Thank you for taking time to come for your Medicare Wellness Visit. I appreciate your ongoing commitment to your health goals. Please review the following plan we discussed and let me know if I can assist you in the future.  ? ?Screening recommendations/referrals: ?Colonoscopy: up to date ?Mammogram: Education provided ?Bone Density: Education provided ?Recommended yearly ophthalmology/optometry visit for glaucoma screening and checkup ?Recommended yearly dental visit for hygiene and checkup ? ?Vaccinations: ?Influenza vaccine: up to date ?Pneumococcal vaccine: up to date ?Tdap vaccine: up to date ?Shingles vaccine: up to date ? ? ? ?Next appointment: 07-03-2022 @ 10:20 [name] ? ? ?Preventive Care 5 Years and Older, Female ?Preventive care refers to lifestyle choices and visits with your health care provider that can promote health and wellness. ?What does preventive care include? ?A yearly physical exam. This is also called an annual well check. ?Dental exams once or twice a year. ?Routine eye exams. Ask your health care provider how often you should have your eyes checked. ?Personal lifestyle choices, including: ?Daily care of your teeth and gums. ?Regular physical activity. ?Eating a healthy diet. ?Avoiding tobacco and drug use. ?Limiting alcohol use. ?Practicing safe sex. ?Taking low-dose aspirin every day. ?Taking vitamin and mineral supplements as recommended by your health care provider. ?What happens during an annual well check? ?The services and screenings done by your health care provider during your annual well check will depend on your age, overall health, lifestyle risk factors, and family history of disease. ?Counseling  ?Your health care provider may ask you questions about your: ?Alcohol use. ?Tobacco use. ?Drug use. ?Emotional well-being. ?Home and relationship well-being. ?Sexual activity. ?Eating habits. ?History of falls. ?Memory and ability to understand (cognition). ?Work and work  Statistician. ?Reproductive health. ?Screening  ?You may have the following tests or measurements: ?Height, weight, and BMI. ?Blood pressure. ?Lipid and cholesterol levels. These may be checked every 5 years, or more frequently if you are over 39 years old. ?Skin check. ?Lung cancer screening. You may have this screening every year starting at age 14 if you have a 30-pack-year history of smoking and currently smoke or have quit within the past 15 years. ?Fecal occult blood test (FOBT) of the stool. You may have this test every year starting at age 44. ?Flexible sigmoidoscopy or colonoscopy. You may have a sigmoidoscopy every 5 years or a colonoscopy every 10 years starting at age 73. ?Hepatitis C blood test. ?Hepatitis B blood test. ?Sexually transmitted disease (STD) testing. ?Diabetes screening. This is done by checking your blood sugar (glucose) after you have not eaten for a while (fasting). You may have this done every 1-3 years. ?Bone density scan. This is done to screen for osteoporosis. You may have this done starting at age 41. ?Mammogram. This may be done every 1-2 years. Talk to your health care provider about how often you should have regular mammograms. ?Talk with your health care provider about your test results, treatment options, and if necessary, the need for more tests. ?Vaccines  ?Your health care provider may recommend certain vaccines, such as: ?Influenza vaccine. This is recommended every year. ?Tetanus, diphtheria, and acellular pertussis (Tdap, Td) vaccine. You may need a Td booster every 10 years. ?Zoster vaccine. You may need this after age 67. ?Pneumococcal 13-valent conjugate (PCV13) vaccine. One dose is recommended after age 58. ?Pneumococcal polysaccharide (PPSV23) vaccine. One dose is recommended after age 22. ?Talk to your health care provider about which screenings and vaccines you need and how often  you need them. ?This information is not intended to replace advice given to you by  your health care provider. Make sure you discuss any questions you have with your health care provider. ?Document Released: 12/07/2015 Document Revised: 07/30/2016 Document Reviewed: 09/11/2015 ?[employer] Interactive Patient Education ? 2017 [employer]. ? ?Fall Prevention in the Home ?Falls can cause injuries. They can happen to people of all ages. There are many things you can do to make your home safe and to help prevent falls. ?What can I do on the outside of my home? ?Regularly fix the edges of walkways and driveways and fix any cracks. ?Remove anything that might make you trip as you walk through a door, such as a raised step or threshold. ?Trim any bushes or trees on the path to your home. ?Use bright outdoor lighting. ?Clear any walking paths of anything that might make someone trip, such as rocks or tools. ?Regularly check to see if handrails are loose or broken. Make sure that both sides of any steps have handrails. ?Any raised decks and porches should have guardrails on the edges. ?Have any leaves, snow, or ice cleared regularly. ?Use sand or salt on walking paths during winter. ?Clean up any spills in your garage right away. This includes oil or grease spills. ?What can I do in the bathroom? ?Use night lights. ?Install grab bars by the toilet and in the tub and shower. Do not use towel bars as grab bars. ?Use non-skid mats or decals in the tub or shower. ?If you need to sit down in the shower, use a plastic, non-slip stool. ?Keep the floor dry. Clean up any water that spills on the floor as soon as it happens. ?Remove soap buildup in the tub or shower regularly. ?Attach bath mats securely with double-sided non-slip rug tape. ?Do not have throw rugs and other things on the floor that can make you trip. ?What can I do in the bedroom? ?Use night lights. ?Make sure that you have a light by your bed that is easy to reach. ?Do not use any sheets or blankets that are too big for your bed. They should not hang  down onto the floor. ?Have a firm chair that has side arms. You can use this for support while you get dressed. ?Do not have throw rugs and other things on the floor that can make you trip. ?What can I do in the kitchen? ?Clean up any spills right away. ?Avoid walking on wet floors. ?Keep items that you use a lot in easy-to-reach places. ?If you need to reach something above you, use a strong step stool that has a grab bar. ?Keep electrical cords out of the way. ?Do not use floor polish or wax that makes floors slippery. If you must use wax, use non-skid floor wax. ?Do not have throw rugs and other things on the floor that can make you trip. ?What can I do with my stairs? ?Do not leave any items on the stairs. ?Make sure that there are handrails on both sides of the stairs and use them. Fix handrails that are broken or loose. Make sure that handrails are as long as the stairways. ?Check any carpeting to make sure that it is firmly attached to the stairs. Fix any carpet that is loose or worn. ?Avoid having throw rugs at the top or bottom of the stairs. If you do have throw rugs, attach them to the floor with carpet tape. ?Make sure that you have a light  switch at the top of the stairs and the bottom of the stairs. If you do not have them, ask someone to add them for you. ?What else can I do to help prevent falls? ?Wear shoes that: ?Do not have high heels. ?Have rubber bottoms. ?Are comfortable and fit you well. ?Are closed at the toe. Do not wear sandals. ?If you use a stepladder: ?Make sure that it is fully opened. Do not climb a closed stepladder. ?Make sure that both sides of the stepladder are locked into place. ?Ask someone to hold it for you, if possible. ?Clearly mark and make sure that you can see: ?Any grab bars or handrails. ?First and last steps. ?Where the edge of each step is. ?Use tools that help you move around (mobility aids) if they are needed. These  include: ?Canes. ?Walkers. ?Scooters. ?Crutches. ?Turn on the lights when you go into a dark area. Replace any light bulbs as soon as they burn out. ?Set up your furniture so you have a clear path. Avoid moving your furniture around. ?If any of your floor

## 2022-04-11 ENCOUNTER — Telehealth: Payer: Self-pay | Admitting: Hematology and Oncology

## 2022-04-11 ENCOUNTER — Inpatient Hospital Stay: Payer: Medicare Other | Admitting: Hematology and Oncology

## 2022-04-11 ENCOUNTER — Inpatient Hospital Stay: Payer: Medicare Other | Attending: Physician Assistant

## 2022-04-11 ENCOUNTER — Telehealth: Payer: Self-pay

## 2022-04-11 ENCOUNTER — Encounter: Payer: Self-pay | Admitting: Hematology and Oncology

## 2022-04-11 ENCOUNTER — Other Ambulatory Visit: Payer: Self-pay | Admitting: Hematology and Oncology

## 2022-04-11 DIAGNOSIS — Z79899 Other long term (current) drug therapy: Secondary | ICD-10-CM | POA: Insufficient documentation

## 2022-04-11 DIAGNOSIS — R5383 Other fatigue: Secondary | ICD-10-CM | POA: Insufficient documentation

## 2022-04-11 DIAGNOSIS — Z7982 Long term (current) use of aspirin: Secondary | ICD-10-CM | POA: Insufficient documentation

## 2022-04-11 DIAGNOSIS — C9000 Multiple myeloma not having achieved remission: Secondary | ICD-10-CM | POA: Insufficient documentation

## 2022-04-11 DIAGNOSIS — C9001 Multiple myeloma in remission: Secondary | ICD-10-CM

## 2022-04-11 NOTE — Telephone Encounter (Signed)
Spoke with pt regarding missed apt. Patient overslept. Scheduling message sent to reschedule.

## 2022-04-11 NOTE — Telephone Encounter (Signed)
R/s pt per 5/19 inbasket, pt aware

## 2022-04-13 ENCOUNTER — Encounter: Payer: Self-pay | Admitting: Hematology and Oncology

## 2022-04-15 ENCOUNTER — Inpatient Hospital Stay: Payer: Medicare Other

## 2022-04-15 ENCOUNTER — Inpatient Hospital Stay (HOSPITAL_BASED_OUTPATIENT_CLINIC_OR_DEPARTMENT_OTHER): Payer: Medicare Other | Admitting: Hematology and Oncology

## 2022-04-15 VITALS — BP 124/89 | HR 98 | Temp 98.0°F | Resp 17 | Ht 66.0 in | Wt 138.9 lb

## 2022-04-15 DIAGNOSIS — C9001 Multiple myeloma in remission: Secondary | ICD-10-CM

## 2022-04-15 DIAGNOSIS — Z79899 Other long term (current) drug therapy: Secondary | ICD-10-CM | POA: Insufficient documentation

## 2022-04-15 DIAGNOSIS — C9000 Multiple myeloma not having achieved remission: Secondary | ICD-10-CM | POA: Insufficient documentation

## 2022-04-15 DIAGNOSIS — M899 Disorder of bone, unspecified: Secondary | ICD-10-CM | POA: Diagnosis not present

## 2022-04-15 DIAGNOSIS — Z7982 Long term (current) use of aspirin: Secondary | ICD-10-CM | POA: Diagnosis not present

## 2022-04-15 DIAGNOSIS — R5383 Other fatigue: Secondary | ICD-10-CM | POA: Diagnosis not present

## 2022-04-15 LAB — CMP (CANCER CENTER ONLY)
ALT: 24 U/L (ref 0–44)
AST: 22 U/L (ref 15–41)
Albumin: 4.5 g/dL (ref 3.5–5.0)
Alkaline Phosphatase: 56 U/L (ref 38–126)
Anion gap: 5 (ref 5–15)
BUN: 15 mg/dL (ref 8–23)
CO2: 28 mmol/L (ref 22–32)
Calcium: 9.7 mg/dL (ref 8.9–10.3)
Chloride: 104 mmol/L (ref 98–111)
Creatinine: 0.83 mg/dL (ref 0.44–1.00)
GFR, Estimated: >60 mL/min (ref >60)
Glucose, Bld: 91 mg/dL (ref 70–99)
Potassium: 3.9 mmol/L (ref 3.5–5.1)
Sodium: 137 mmol/L (ref 135–145)
Total Bilirubin: 0.8 mg/dL (ref 0.3–1.2)
Total Protein: 7.3 g/dL (ref 6.5–8.1)

## 2022-04-15 LAB — CBC WITH DIFFERENTIAL (CANCER CENTER ONLY)
Abs Immature Granulocytes: 0.01 10*3/uL (ref 0.00–0.07)
Basophils Absolute: 0.1 10*3/uL (ref 0.0–0.1)
Basophils Relative: 1 %
Eosinophils Absolute: 0.7 10*3/uL — ABNORMAL HIGH (ref 0.0–0.5)
Eosinophils Relative: 13 %
HCT: 39.6 % (ref 36.0–46.0)
Hemoglobin: 13.1 g/dL (ref 12.0–15.0)
Immature Granulocytes: 0 %
Lymphocytes Relative: 25 %
Lymphs Abs: 1.3 10*3/uL (ref 0.7–4.0)
MCH: 28.9 pg (ref 26.0–34.0)
MCHC: 33.1 g/dL (ref 30.0–36.0)
MCV: 87.4 fL (ref 80.0–100.0)
Monocytes Absolute: 0.8 10*3/uL (ref 0.1–1.0)
Monocytes Relative: 15 %
Neutro Abs: 2.4 10*3/uL (ref 1.7–7.7)
Neutrophils Relative %: 46 %
Platelet Count: 266 10*3/uL (ref 150–400)
RBC: 4.53 MIL/uL (ref 3.87–5.11)
RDW: 14.6 % (ref 11.5–15.5)
WBC Count: 5.3 10*3/uL (ref 4.0–10.5)
nRBC: 0 % (ref 0.0–0.2)

## 2022-04-15 LAB — LACTATE DEHYDROGENASE: LDH: 152 U/L (ref 98–192)

## 2022-04-15 NOTE — Progress Notes (Signed)
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 Concerning for multiple myeloma vs metastatic disease 12/31/2020: establish care with Dr. Lorenso Courier. UPEP showed marked Bence Jones protein, findings concerning for multiple myleoma 01/21/2021: bone marrow biopsy confirms multiple myeloma with atypical plasma cells representing 28% of all cells in the aspirate  02/01/2021: Cycle 1 Day 1 of VRd chemotherapy 02/22/2021: Cycle 2 Day 1 of VRd chemotherapy 03/15/2021: Cycle 3 Day 1 of VRd chemotherapy 04/05/2021: Cycle 4 Day 1 of VRd chemotherapy 04/26/2021: Cycle 5 Day 1 of VRd chemotherapy 05/17/2021: Cycle 6 Day 1 of VRd chemotherapy 06/07/2021:  Cycle 7 Day 1 of VRd chemotherapy 06/28/2021: Cycle 8 Day 1 of VRd chemotherapy 07/19/2021: start maintenance revlimid 27m PO daily.   Interval History:  Doris Horiuchi89y.o. female with medical history significant for lambda light chain multiple myeloma who presents for a follow up visit. The patient's last visit was on 03/14/2022. In the interim since she has continued on her revlimid without difficulty.   On exam today Doris Wilkerson reports her major symptom continues to be fatigue.  She unfortunately slept through her last clinic visit while taking a large nap.  She reports that she may be doing more than usual and increasing her number of responsibilities.  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  She notes that  her appetite has been good but she does occasionally miss meals due to fatigue.  She has been faithfully taking her Revlimid pills and has not noticed any new symptoms.  She denies fevers, chills, night sweats, shortness of breath, chest pain or cough. She hsa no other complaints. A full 10 point ROS is listed below.   MEDICAL HISTORY:  Past Medical History:  Diagnosis Date   Asthma    Cataract    GERD (gastroesophageal reflux disease)    Hyperlipidemia    Hypertension    Thyroid disease     SURGICAL HISTORY: Past Surgical History:  Procedure Laterality Date   Cataract Surgery     CORONARY ANGIOPLASTY WITH STENT PLACEMENT     EYE SURGERY     ORTHOPEDIC SURGERY  2012   TONSILLECTOMY  1951    SOCIAL HISTORY: Social History   Socioeconomic History   Marital status: Married    Spouse name: Not on file   Number of children: 0   Years of education: Not on file   Highest education level: Not on file  Occupational History   Occupation: unemployed  Tobacco Use   Smoking status: Never   Smokeless tobacco: Never  Vaping Use   Vaping Use: Never used  Substance and Sexual Activity   Alcohol use: Yes    Alcohol/week: 1.0 standard drink    Types: 1 Glasses of wine per week    Comment: occassionally   Drug use: No   Sexual activity: Not Currently  Other Topics Concern   Not on file  Social History Narrative   Married. Education: college. Pt does exercise-   Social Determinants of Health  Financial Resource Strain: Low Risk    Difficulty of Paying Living Expenses: Not hard at all  Food Insecurity: No Food Insecurity   Worried About Charity fundraiser in the Last Year: Never true   Ran Out of Food in the Last Year: Never true  Transportation Needs: No Transportation Needs   Lack of Transportation (Medical): No   Lack of Transportation (Non-Medical): No  Physical Activity: Sufficiently Active   Days of Exercise per Week: 3 days   Minutes of Exercise per Session: 50 min   Stress: No Stress Concern Present   Feeling of Stress : Not at all  Social Connections: Moderately Integrated   Frequency of Communication with Friends and Family: More than three times a week   Frequency of Social Gatherings with Friends and Family: More than three times a week   Attends Religious Services: Never   Marine scientist or Organizations: Yes   Attends Music therapist: More than 4 times per year   Marital Status: Married  Human resources officer Violence: Not At Risk   Fear of Current or Ex-Partner: No   Emotionally Abused: No   Physically Abused: No   Sexually Abused: No    FAMILY HISTORY: Family History  Problem Relation Age of Onset   Cancer Father    Heart disease Brother    Dementia Brother    Leukemia Brother    Breast cancer Neg Hx    Colon cancer Neg Hx    Esophageal cancer Neg Hx    Pancreatic cancer Neg Hx    Rectal cancer Neg Hx    Stomach cancer Neg Hx     ALLERGIES:  is allergic to poison ivy extract, plavix [clopidogrel bisulfate], and other.  MEDICATIONS:  Current Outpatient Medications  Medication Sig Dispense Refill   acetaminophen (TYLENOL 8 HOUR ARTHRITIS PAIN) 650 MG CR tablet Take 1 tablet (650 mg total) by mouth every 8 (eight) hours as needed for pain. (Patient not taking: Reported on 04/03/2022)     acetaminophen (TYLENOL) 500 MG tablet Take 500 mg by mouth every 6 (six) hours as needed for mild pain. Take 3 times daily     aspirin 81 MG tablet Take 81 mg by mouth daily.      atorvastatin (LIPITOR) 80 MG tablet TAKE 1 TABLET(80 MG) BY MOUTH DAILY AT 6 PM 90 tablet 1   benazepril (LOTENSIN) 10 MG tablet TAKE 1 TABLET(10 MG) BY MOUTH DAILY 90 tablet 1   calcium carbonate (TUMS - DOSED IN MG ELEMENTAL CALCIUM) 500 MG chewable tablet Chew 1 tablet by mouth daily. daily     cholecalciferol (VITAMIN D3) 25 MCG (1000 UNIT) tablet Take 2,000 Units by mouth daily.     famotidine-calcium carbonate-magnesium hydroxide (PEPCID COMPLETE)  10-800-165 MG chewable tablet Chew 1 tablet by mouth daily. (Patient not taking: Reported on 04/03/2022)     gabapentin (NEURONTIN) 100 MG capsule Take 1 capsule (100 mg total) by mouth 2 (two) times daily as needed. 180 capsule 1   lenalidomide (REVLIMID) 10 MG capsule TAKE 1 CAPSULE BY MOUTH DAILY  FOR 21 DAYS, THEN 7 DAYS OFF [employer] Auth #   [id number] Date Obtained: 03/27/22 21 capsule 0   levothyroxine (SYNTHROID) 25 MCG tablet TAKE 1 TABLET(25 MCG) BY MOUTH DAILY BEFORE BREAKFAST 90 tablet 3   MAGNESIUM PO Take 250 mg by mouth daily.     Zoledronic Acid (ZOMETA IV) Inject into the vein. Every 3 months     No current  facility-administered medications for this visit.    REVIEW OF SYSTEMS:   Constitutional: ( - ) fevers, ( - )  chills , ( - ) night sweats Eyes: ( - ) blurriness of vision, ( - ) double vision, ( - ) watery eyes Ears, nose, mouth, throat, and face: ( - ) mucositis, ( - ) sore throat Respiratory: ( - ) cough, ( - ) dyspnea, ( - ) wheezes Cardiovascular: ( - ) palpitation, ( - ) chest discomfort, ( - ) lower extremity swelling Gastrointestinal:  ( - ) nausea, ( - ) heartburn, ( - ) change in bowel habits Skin: ( - ) abnormal skin rashes Lymphatics: ( - ) new lymphadenopathy, ( - ) easy bruising Neurological: ( +) numbness, ( - ) tingling, ( - ) new weaknesses Behavioral/Psych: ( - ) mood change, ( - ) new changes  All other systems were reviewed with the patient and are negative.  PHYSICAL EXAMINATION: ECOG PERFORMANCE STATUS: 1 - Symptomatic but completely ambulatory  Vitals:   04/15/22 1048  BP: 124/89  Pulse: 98  Resp: 17  Temp: 98 F (36.7 C)  SpO2: 100%     Filed Weights   04/15/22 1048  Weight: 138 lb 14.4 oz (63 kg)     GENERAL: well appearing elderly Caucasian female in NAD  SKIN: skin color, texture, turgor are normal, no rashes or significant lesions EYES: conjunctiva are pink and non-injected, sclera clear LUNGS: clear to auscultation and  percussion with normal breathing effort HEART: regular rate & rhythm and no murmurs and trace lower extremity edema Musculoskeletal: no cyanosis of digits and no clubbing  PSYCH: alert & oriented x 3, fluent speech NEURO: no focal motor/sensory deficits  LABORATORY DATA:  I have reviewed the data as listed    Latest Ref Rng & Units 04/15/2022   10:25 AM 03/14/2022   11:11 AM 02/17/2022   12:32 PM  CBC  WBC 4.0 - 10.5 K/uL 5.3   5.4   5.1    Hemoglobin 12.0 - 15.0 g/dL 13.1   11.6   12.1    Hematocrit 36.0 - 46.0 % 39.6   35.7   37.0    Platelets 150 - 400 K/uL 266   207   229         Latest Ref Rng & Units 04/15/2022   10:25 AM 03/14/2022   11:11 AM 02/17/2022   12:32 PM  CMP  Glucose 70 - 99 mg/dL 91   97   89    BUN 8 - 23 mg/dL '15   14   13    ' Creatinine 0.44 - 1.00 mg/dL 0.83   0.75   0.78    Sodium 135 - 145 mmol/L 137   139   137    Potassium 3.5 - 5.1 mmol/L 3.9   4.3   3.7    Chloride 98 - 111 mmol/L 104   110   106    CO2 22 - 32 mmol/L '28   23   27    ' Calcium 8.9 - 10.3 mg/dL 9.7   9.5   9.5    Total Protein 6.5 - 8.1 g/dL 7.3   6.4   6.7    Total Bilirubin 0.3 - 1.2 mg/dL 0.8   0.6   0.6    Alkaline Phos 38 - 126 U/L 56   53   52    AST 15 - 41 U/L 22   26   22  ALT 0 - 44 U/L '24   26   21      ' Lab Results  Component Value Date   MPROTEIN Not Observed 03/14/2022   MPROTEIN Not Observed 02/17/2022   MPROTEIN Not Observed 01/17/2022   Lab Results  Component Value Date   KPAFRELGTCHN 22.2 (H) 03/14/2022   KPAFRELGTCHN 21.1 (H) 02/17/2022   KPAFRELGTCHN 21.6 (H) 01/17/2022   LAMBDASER 31.2 (H) 03/14/2022   LAMBDASER 31.6 (H) 02/17/2022   LAMBDASER 32.6 (H) 01/17/2022   KAPLAMBRATIO 0.71 03/14/2022   KAPLAMBRATIO 9.13 03/13/2022   KAPLAMBRATIO 0.67 02/17/2022    RADIOGRAPHIC STUDIES: CT THORACIC SPINE W CONTRAST  Result Date: 03/27/2022 CLINICAL DATA:  Back pain.  Myeloma currently in remission. EXAM: CT THORACIC SPINE WITH CONTRAST TECHNIQUE:  Multidetector CT images of thoracic was performed according to the standard protocol following intravenous contrast administration. RADIATION DOSE REDUCTION: This exam was performed according to the departmental dose-optimization program which includes automated exposure control, adjustment of the mA and/or kV according to patient size and/or use of iterative reconstruction technique. CONTRAST:  173m OMNIPAQUE IOHEXOL 300 MG/ML  SOLN COMPARISON:  Lumbar study same day FINDINGS: Alignment: No malalignment. Vertebrae: No lesions seen from C6 through T7. One could question a 4 mm lytic focus within the posterior elements on the left at T4 but this is not definite. At T8, there is a 9 mm lucency within the left posterosuperior corner of the vertebral body. The margins appear sclerotic and this may be a treated and inactive lesion. At T9, there is a treated lesion of the posterior aspect of the vertebral body which appears to have sclerotic margins. There was a pathologic fracture in this region previously. I do not see evidence of any extraosseous tumor. T10 and T11 appear negative. 6 mm lucency within the left side of the T12 vertebral body is demonstrated, as was seen on the lumbar study. Paraspinal and other soft tissues: In negative. Posterior ribs appear negative. Visualized portions of the sternum are negative. Benign appearing pleural and parenchymal scarring at the lung apices. No active pulmonary process is visible. Disc levels: Ordinary thoracic region facet arthritis. No evidence of significant degenerative disc disease. IMPRESSION: Treated disease of the posterior T9 vertebral body with sequela of previous pathologic fracture. No sign of residual or recurrent enhancing tumor in this location. No visible canal compromise. This may be sequela of treated disease. 9 mm lucency within the left posterosuperior corner of the T8 vertebral body, likely sequela of treated disease. 6 mm lucency within the left side of  the T12 vertebral body as was shown on the lumbar exam. Question 4 mm lucency within the left posterior elements at T4. No evidence of definite active or aggressive lesion. Electronically Signed   By: MNelson ChimesM.D.   On: 03/27/2022 20:37   CT Lumbar Spine W Contrast  Result Date: 03/27/2022 CLINICAL DATA:  Hematologic malignancy, monitor back pain. History of multiple myeloma currently in remission. EXAM: CT LUMBAR SPINE WITH CONTRAST TECHNIQUE: Multidetector CT imaging of the lumbar spine was performed with intravenous contrast administration. RADIATION DOSE REDUCTION: This exam was performed according to the departmental dose-optimization program which includes automated exposure control, adjustment of the mA and/or kV according to patient size and/or use of iterative reconstruction technique. CONTRAST:  1045mOMNIPAQUE IOHEXOL 300 MG/ML  SOLN COMPARISON:  Images from CT biopsy 01/21/2021 FINDINGS: Segmentation: 5 lumbar type vertebral bodies. Alignment: Mild scoliotic curvature. Retrolisthesis of L2 relative to L3 measuring 6-7 mm. Vertebrae: 6  mm lucency within the left side of the T12 vertebral body. No visible abnormality of the L1 vertebra. 11 mm lucency within the posteroinferior left corner of the L2 vertebral body with sclerotic margins which could relate to chronic inactive myeloma. Degenerative type endplate changes at Q9-4 related to chronic degenerative disc disease. No visible abnormality of the L3 vertebra otherwise. No abnormality of the L4 vertebra. 10 mm lucency within the left side of the L5 vertebral body appears the same as it did on the biopsy CT of 01/21/2021. No focal abnormality seen affecting the sacrum or medial aspects the iliac bones. Paraspinal and other soft tissues: Negative other than ordinary aortic atherosclerosis. Disc levels: Degenerative disc disease at L2-3. 6-7 mm of retrolisthesis. Loss of disc height. No compressive canal stenosis. Mild foraminal narrowing. The other  levels are unremarkable. IMPRESSION: 6 mm lucency within the left side of the T12 vertebral body. 11 mm lucency within the left posteroinferior corner of the L2 vertebral body. 10 mm lucency within the left side of the L5 vertebral body. The L5 abnormality appears unchanged from a biopsy CT done in February of 2022. The other abnormalities were not previously imaged that I can find, but may indicate chronic treated or quiescent disease. Electronically Signed   By: Nelson Chimes M.D.   On: 03/27/2022 20:31    ASSESSMENT & PLAN Doris Wilkerson 76 y.o. female with medical history significant for lambda light chain multiple myeloma who presents for a follow up visit.   After review the labs, review the records, schedule the patient the findings most consistent with a lambda light chain multiple myeloma.  This is getting confirmed with a bone marrow biopsy showing an abnormal plasma cell population of 28% and M protein/Bence-Jones proteins in the urine.  Given these findings the patient now carries a diagnosis of lambda light chain multiple myeloma.  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  This consists of bortezomib 1.3 mg per metered squared q week, lenalidomide 25 mg p.o. x14 days of 21-day cycle, and dexamethasone 40 mg p.o. q. weekly.  This will be continued until he reached a VGPR/ complete 8 cycles of triplet therapy, at which time we can consider transition over to maintenance Revlimid 10 mg p.o. daily 21 of 28 days per cycle.  Ms. Littrell is not a candidate for bone marrow transplant based on her advanced age.  She transition to maintenance Revlimid on 07/19/2021.  # Lambda Light Chain Multiple Myeloma--VGPR on maintenance --diagnosis confirmed with bone marrow biopsy and urine protein analysis.   --patient has good functional status and would be an excellent candidate for VRd chemotherapy. I do not believe she would be a bone marrow transplant candidate based on  her age.  --assure monthly restaging labs with SPEP, UPEP, and SFLC --patient declines port placement at this time --Cycle 1 Day 1 of therapy started on 02/01/2021.  --she completed Cycle 8 Day 1 of Vrd, started maintenance Revlimid on 07/19/2021. --Labs from today were reviewed and require no intervention. SPEP and sFLC pending.  Plan: -- Continue on maintenance revlimid 51m PO daily  --Monthly restaging labs with serum free light chains, UPEP, and SPEP. Last SPEP showed undetectable M protein and SFLC show a normal ratio --Labs today show white blood cell count 5.3, hemoglobin 13.1, platelets 266 and creatinine 0.83 --Return to clinic every 4 weeks  #Back Pain -- Currently well controlled on gabapentin and Tylenol --Will order CT thoracic and lumbar  spine to better evaluate for possible lytic lesions or sequelae of multiple myeloma --Continue to monitor  #Supportive Care --chemotherapy education complete  --zofran 70m q8H PRN and compazine 112mPO q6H for nausea --acyclovir can be d/c --zometa therapy performed on 03/14/2022. Repeat q 3 months.  -- no pain medication required at this time.   No orders of the defined types were placed in this encounter.  All questions were answered. The patient knows to call the clinic with any problems, questions or concerns.  I have spent a total of 30 minutes minutes of face-to-face and non-face-to-face time, preparing to see the patient, performing a medically appropriate examination, counseling and educating the patient, documenting clinical information in the electronic health record,   JoOrson SlickMD Department of Hematology/Oncology CoUniversity Parkt WeNationwide Children'S Hospitalhone: [phone]/23/2023 1:26 PM   Literature Support:  1) SuBennett ScrapeBortezomib, lenalidomide, and dexamethasone in  transplant-eligible newly diagnosed multiple myeloma patients: a multicenter retrospective comparative analysis. Int J Hinton Dyer2020 Jan;111(1):103-111. doi: 10.1007/s12185-019-02764-1.  -- Overall response, very good partial response, and complete response rates after VRD were 96.4%, 45.5%, and 20.0%, respectively (median follow-up period, 17.7 months). The 1-year progression-free survival (PFS) and overall survival rates were 95.8% and 98.2%, respectively. The response rate and PFS were similar between the groups, regardless of cytogenetic risk and age.  2) JaCasandra [id number], Pawlyn C,Loletha GrayerCaWoodland HillsStWhitefieldCoGirardHockaday A, Jones JR, Kishore B, Garg M, Williams CD, Karunanithi K,Delaine LameW, CoLaban EmperorH, KaConradDrayson MT, OwRobinette HainesM, MoMyra RudeUKVenezuelaCRI [facility]-oncology Clinical Studies Group. Lenalidomide maintenance versus observation for patients with newly diagnosed multiple myeloma (Myeloma XI): a multicentre, open-label, randomised, phase 3 trial. Lancet Oncol. 2019 Jan;20(1):57-73. doi: 10.1016/S1470-2045(18)30687-9. Epub 2018 Dec 14.   --Maintenance therapy with lenalidomide significantly improved progression-free survival in patients with newly diagnosed multiple myeloma compared with observation   --After a median follow-up of 31 months (IQR 18-50), median progression-free survival was 39 months (95% CI 36-42) with lenalidomide and 20 months (18-22) with observation (hazard ratio [HR] 046 [95% CI 041-053]; p<00001), and 3-year overall survival was 786% (95% Cl 756-816) in the lenalidomide group and 758% (7(263-335in the observation group (HR 087 [95% CI 073-105]; p=015). Progression-free survival was improved with lenalidomide compared with observation across all prespecified subgroups.

## 2022-04-16 ENCOUNTER — Telehealth: Payer: Self-pay | Admitting: Hematology and Oncology

## 2022-04-16 ENCOUNTER — Encounter: Payer: Self-pay | Admitting: Family Medicine

## 2022-04-16 ENCOUNTER — Encounter: Payer: Self-pay | Admitting: Hematology and Oncology

## 2022-04-16 LAB — KAPPA/LAMBDA LIGHT CHAINS
Kappa free light chain: 27.9 mg/L — ABNORMAL HIGH (ref 3.3–19.4)
Kappa, lambda light chain ratio: 0.68 (ref 0.26–1.65)
Lambda free light chains: 41 mg/L — ABNORMAL HIGH (ref 5.7–26.3)

## 2022-04-16 LAB — BETA 2 MICROGLOBULIN, SERUM: Beta-2 Microglobulin: 1.8 mg/L (ref 0.6–2.4)

## 2022-04-16 NOTE — Telephone Encounter (Signed)
Per 5/23 los called pt about appointment   pt confirmed appointment

## 2022-04-16 NOTE — Telephone Encounter (Signed)
Update from pt.

## 2022-04-18 LAB — MULTIPLE MYELOMA PANEL, SERUM
Albumin SerPl Elph-Mcnc: 4.1 g/dL (ref 2.9–4.4)
Albumin/Glob SerPl: 1.7 (ref 0.7–1.7)
Alpha 1: 0.2 g/dL (ref 0.0–0.4)
Alpha2 Glob SerPl Elph-Mcnc: 0.7 g/dL (ref 0.4–1.0)
B-Globulin SerPl Elph-Mcnc: 0.9 g/dL (ref 0.7–1.3)
Gamma Glob SerPl Elph-Mcnc: 0.7 g/dL (ref 0.4–1.8)
Globulin, Total: 2.5 g/dL (ref 2.2–3.9)
IgA: 179 mg/dL (ref 64–422)
IgG (Immunoglobin G), Serum: 806 mg/dL (ref 586–1602)
IgM (Immunoglobulin M), Srm: 24 mg/dL — ABNORMAL LOW (ref 26–217)
Total Protein ELP: 6.6 g/dL (ref 6.0–8.5)

## 2022-04-30 ENCOUNTER — Other Ambulatory Visit: Payer: Self-pay | Admitting: *Deleted

## 2022-04-30 MED ORDER — LENALIDOMIDE 10 MG PO CAPS: ORAL_CAPSULE | ORAL | 0 refills | Status: DC

## 2022-05-16 ENCOUNTER — Inpatient Hospital Stay: Payer: Medicare Other | Attending: Physician Assistant

## 2022-05-16 ENCOUNTER — Other Ambulatory Visit: Payer: Self-pay | Admitting: *Deleted

## 2022-05-16 ENCOUNTER — Inpatient Hospital Stay (HOSPITAL_BASED_OUTPATIENT_CLINIC_OR_DEPARTMENT_OTHER): Payer: Medicare Other | Admitting: Hematology and Oncology

## 2022-05-16 ENCOUNTER — Other Ambulatory Visit: Payer: Self-pay

## 2022-05-16 VITALS — BP 135/70 | HR 67 | Temp 97.7°F | Resp 18 | Ht 66.0 in | Wt 138.9 lb

## 2022-05-16 DIAGNOSIS — M899 Disorder of bone, unspecified: Secondary | ICD-10-CM | POA: Diagnosis not present

## 2022-05-16 DIAGNOSIS — C9000 Multiple myeloma not having achieved remission: Secondary | ICD-10-CM | POA: Insufficient documentation

## 2022-05-16 DIAGNOSIS — M549 Dorsalgia, unspecified: Secondary | ICD-10-CM | POA: Diagnosis not present

## 2022-05-16 DIAGNOSIS — C9001 Multiple myeloma in remission: Secondary | ICD-10-CM | POA: Diagnosis not present

## 2022-05-16 LAB — CMP (CANCER CENTER ONLY)
ALT: 26 U/L (ref 0–44)
AST: 23 U/L (ref 15–41)
Albumin: 4.4 g/dL (ref 3.5–5.0)
Alkaline Phosphatase: 52 U/L (ref 38–126)
Anion gap: 4 — ABNORMAL LOW (ref 5–15)
BUN: 13 mg/dL (ref 8–23)
CO2: 28 mmol/L (ref 22–32)
Calcium: 9.8 mg/dL (ref 8.9–10.3)
Chloride: 106 mmol/L (ref 98–111)
Creatinine: 0.78 mg/dL (ref 0.44–1.00)
GFR, Estimated: >60 mL/min (ref >60)
Glucose, Bld: 92 mg/dL (ref 70–99)
Potassium: 4.1 mmol/L (ref 3.5–5.1)
Sodium: 138 mmol/L (ref 135–145)
Total Bilirubin: 0.5 mg/dL (ref 0.3–1.2)
Total Protein: 6.8 g/dL (ref 6.5–8.1)

## 2022-05-16 LAB — CBC WITH DIFFERENTIAL (CANCER CENTER ONLY)
Abs Immature Granulocytes: 0 10*3/uL (ref 0.00–0.07)
Basophils Absolute: 0.1 10*3/uL (ref 0.0–0.1)
Basophils Relative: 2 %
Eosinophils Absolute: 0.4 10*3/uL (ref 0.0–0.5)
Eosinophils Relative: 10 %
HCT: 38.4 % (ref 36.0–46.0)
Hemoglobin: 12.6 g/dL (ref 12.0–15.0)
Immature Granulocytes: 0 %
Lymphocytes Relative: 31 %
Lymphs Abs: 1.3 10*3/uL (ref 0.7–4.0)
MCH: 28.9 pg (ref 26.0–34.0)
MCHC: 32.8 g/dL (ref 30.0–36.0)
MCV: 88.1 fL (ref 80.0–100.0)
Monocytes Absolute: 0.4 10*3/uL (ref 0.1–1.0)
Monocytes Relative: 10 %
Neutro Abs: 2 10*3/uL (ref 1.7–7.7)
Neutrophils Relative %: 47 %
Platelet Count: 239 10*3/uL (ref 150–400)
RBC: 4.36 MIL/uL (ref 3.87–5.11)
RDW: 15 % (ref 11.5–15.5)
WBC Count: 4.1 10*3/uL (ref 4.0–10.5)
nRBC: 0 % (ref 0.0–0.2)

## 2022-05-16 LAB — LACTATE DEHYDROGENASE: LDH: 166 U/L (ref 98–192)

## 2022-05-17 ENCOUNTER — Other Ambulatory Visit: Payer: Self-pay | Admitting: Family Medicine

## 2022-05-17 DIAGNOSIS — E782 Mixed hyperlipidemia: Secondary | ICD-10-CM

## 2022-05-17 DIAGNOSIS — I10 Essential (primary) hypertension: Secondary | ICD-10-CM

## 2022-05-19 ENCOUNTER — Other Ambulatory Visit: Payer: Self-pay

## 2022-05-19 ENCOUNTER — Inpatient Hospital Stay: Payer: Medicare Other

## 2022-05-19 DIAGNOSIS — M549 Dorsalgia, unspecified: Secondary | ICD-10-CM | POA: Diagnosis not present

## 2022-05-19 DIAGNOSIS — C9001 Multiple myeloma in remission: Secondary | ICD-10-CM

## 2022-05-19 DIAGNOSIS — C9000 Multiple myeloma not having achieved remission: Secondary | ICD-10-CM | POA: Diagnosis not present

## 2022-05-20 ENCOUNTER — Encounter: Payer: Self-pay | Admitting: Hematology and Oncology

## 2022-05-20 LAB — KAPPA/LAMBDA LIGHT CHAINS
Kappa free light chain: 29.4 mg/L — ABNORMAL HIGH (ref 3.3–19.4)
Kappa, lambda light chain ratio: 0.73 (ref 0.26–1.65)
Lambda free light chains: 40.2 mg/L — ABNORMAL HIGH (ref 5.7–26.3)

## 2022-05-22 LAB — MULTIPLE MYELOMA PANEL, SERUM
Albumin SerPl Elph-Mcnc: 3.8 g/dL (ref 2.9–4.4)
Albumin/Glob SerPl: 1.8 — ABNORMAL HIGH (ref 0.7–1.7)
Alpha 1: 0.2 g/dL (ref 0.0–0.4)
Alpha2 Glob SerPl Elph-Mcnc: 0.7 g/dL (ref 0.4–1.0)
B-Globulin SerPl Elph-Mcnc: 0.7 g/dL (ref 0.7–1.3)
Gamma Glob SerPl Elph-Mcnc: 0.7 g/dL (ref 0.4–1.8)
Globulin, Total: 2.2 g/dL (ref 2.2–3.9)
IgA: 160 mg/dL (ref 64–422)
IgG (Immunoglobin G), Serum: 734 mg/dL (ref 586–1602)
IgM (Immunoglobulin M), Srm: 23 mg/dL — ABNORMAL LOW (ref 26–217)
Total Protein ELP: 6 g/dL (ref 6.0–8.5)

## 2022-05-26 ENCOUNTER — Other Ambulatory Visit: Payer: Self-pay | Admitting: *Deleted

## 2022-05-26 MED ORDER — LENALIDOMIDE 10 MG PO CAPS: ORAL_CAPSULE | ORAL | 0 refills | Status: DC

## 2022-06-03 ENCOUNTER — Encounter: Payer: Self-pay | Admitting: Hematology and Oncology

## 2022-06-12 ENCOUNTER — Inpatient Hospital Stay: Payer: Medicare Other

## 2022-06-12 ENCOUNTER — Other Ambulatory Visit: Payer: Self-pay

## 2022-06-12 ENCOUNTER — Inpatient Hospital Stay (HOSPITAL_BASED_OUTPATIENT_CLINIC_OR_DEPARTMENT_OTHER): Payer: Medicare Other | Admitting: Hematology and Oncology

## 2022-06-12 ENCOUNTER — Inpatient Hospital Stay: Payer: Medicare Other | Attending: Physician Assistant

## 2022-06-12 ENCOUNTER — Other Ambulatory Visit: Payer: Self-pay | Admitting: Hematology and Oncology

## 2022-06-12 ENCOUNTER — Other Ambulatory Visit: Payer: Self-pay | Admitting: *Deleted

## 2022-06-12 VITALS — BP 116/55 | HR 60 | Temp 97.9°F | Resp 16

## 2022-06-12 VITALS — BP 113/68 | HR 63 | Temp 97.7°F | Resp 18 | Ht 66.0 in | Wt 137.4 lb

## 2022-06-12 DIAGNOSIS — C9001 Multiple myeloma in remission: Secondary | ICD-10-CM

## 2022-06-12 DIAGNOSIS — C9000 Multiple myeloma not having achieved remission: Secondary | ICD-10-CM | POA: Diagnosis not present

## 2022-06-12 DIAGNOSIS — T451X5A Adverse effect of antineoplastic and immunosuppressive drugs, initial encounter: Secondary | ICD-10-CM

## 2022-06-12 DIAGNOSIS — Z79899 Other long term (current) drug therapy: Secondary | ICD-10-CM | POA: Diagnosis not present

## 2022-06-12 DIAGNOSIS — Z7961 Long term (current) use of immunomodulator: Secondary | ICD-10-CM | POA: Insufficient documentation

## 2022-06-12 DIAGNOSIS — Z7982 Long term (current) use of aspirin: Secondary | ICD-10-CM | POA: Insufficient documentation

## 2022-06-12 DIAGNOSIS — D6481 Anemia due to antineoplastic chemotherapy: Secondary | ICD-10-CM | POA: Diagnosis not present

## 2022-06-12 DIAGNOSIS — M549 Dorsalgia, unspecified: Secondary | ICD-10-CM | POA: Insufficient documentation

## 2022-06-12 DIAGNOSIS — M899 Disorder of bone, unspecified: Secondary | ICD-10-CM | POA: Diagnosis not present

## 2022-06-12 LAB — CBC WITH DIFFERENTIAL (CANCER CENTER ONLY)
Abs Immature Granulocytes: 0.01 10*3/uL (ref 0.00–0.07)
Basophils Absolute: 0.1 10*3/uL (ref 0.0–0.1)
Basophils Relative: 3 %
Eosinophils Absolute: 0.4 10*3/uL (ref 0.0–0.5)
Eosinophils Relative: 9 %
HCT: 38.4 % (ref 36.0–46.0)
Hemoglobin: 12.6 g/dL (ref 12.0–15.0)
Immature Granulocytes: 0 %
Lymphocytes Relative: 33 %
Lymphs Abs: 1.5 10*3/uL (ref 0.7–4.0)
MCH: 29.3 pg (ref 26.0–34.0)
MCHC: 32.8 g/dL (ref 30.0–36.0)
MCV: 89.3 fL (ref 80.0–100.0)
Monocytes Absolute: 0.6 10*3/uL (ref 0.1–1.0)
Monocytes Relative: 13 %
Neutro Abs: 1.9 10*3/uL (ref 1.7–7.7)
Neutrophils Relative %: 42 %
Platelet Count: 223 10*3/uL (ref 150–400)
RBC: 4.3 MIL/uL (ref 3.87–5.11)
RDW: 15.4 % (ref 11.5–15.5)
WBC Count: 4.5 10*3/uL (ref 4.0–10.5)
nRBC: 0 % (ref 0.0–0.2)

## 2022-06-12 LAB — CMP (CANCER CENTER ONLY)
ALT: 30 U/L (ref 0–44)
AST: 28 U/L (ref 15–41)
Albumin: 4.6 g/dL (ref 3.5–5.0)
Alkaline Phosphatase: 46 U/L (ref 38–126)
Anion gap: 5 (ref 5–15)
BUN: 13 mg/dL (ref 8–23)
CO2: 26 mmol/L (ref 22–32)
Calcium: 9.4 mg/dL (ref 8.9–10.3)
Chloride: 104 mmol/L (ref 98–111)
Creatinine: 0.81 mg/dL (ref 0.44–1.00)
GFR, Estimated: >60 mL/min (ref >60)
Glucose, Bld: 89 mg/dL (ref 70–99)
Potassium: 3.7 mmol/L (ref 3.5–5.1)
Sodium: 135 mmol/L (ref 135–145)
Total Bilirubin: 0.7 mg/dL (ref 0.3–1.2)
Total Protein: 7 g/dL (ref 6.5–8.1)

## 2022-06-12 LAB — LACTATE DEHYDROGENASE: LDH: 163 U/L (ref 98–192)

## 2022-06-12 MED ORDER — ZOLEDRONIC ACID 4 MG/100ML IV SOLN
4.0000 mg | Freq: Once | INTRAVENOUS | Status: AC
  Administered 2022-06-12: 4 mg via INTRAVENOUS
  Filled 2022-06-12: qty 100

## 2022-06-12 MED ORDER — SODIUM CHLORIDE 0.9 % IV SOLN: Freq: Once | INTRAVENOUS | Status: AC

## 2022-06-12 NOTE — Patient Instructions (Signed)

## 2022-06-13 LAB — BETA 2 MICROGLOBULIN, SERUM: Beta-2 Microglobulin: 1.7 mg/L (ref 0.6–2.4)

## 2022-06-13 LAB — KAPPA/LAMBDA LIGHT CHAINS
Kappa free light chain: 25.7 mg/L — ABNORMAL HIGH (ref 3.3–19.4)
Kappa, lambda light chain ratio: 0.65 (ref 0.26–1.65)
Lambda free light chains: 39.7 mg/L — ABNORMAL HIGH (ref 5.7–26.3)

## 2022-06-15 ENCOUNTER — Encounter: Payer: Self-pay | Admitting: Hematology and Oncology

## 2022-06-15 NOTE — Progress Notes (Signed)
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 Concerning for multiple myeloma vs metastatic disease 12/31/2020: establish care with Dr. Lorenso Courier. UPEP showed marked Bence Jones protein, findings concerning for multiple myleoma 01/21/2021: bone marrow biopsy confirms multiple myeloma with atypical plasma cells representing 28% of all cells in the aspirate  02/01/2021: Cycle 1 Day 1 of VRd chemotherapy 02/22/2021: Cycle 2 Day 1 of VRd chemotherapy 03/15/2021: Cycle 3 Day 1 of VRd chemotherapy 04/05/2021: Cycle 4 Day 1 of VRd chemotherapy 04/26/2021: Cycle 5 Day 1 of VRd chemotherapy 05/17/2021: Cycle 6 Day 1 of VRd chemotherapy 06/07/2021:  Cycle 7 Day 1 of VRd chemotherapy 06/28/2021: Cycle 8 Day 1 of VRd chemotherapy 07/19/2021: start maintenance revlimid 31m PO daily.   Interval History:  Doris Kham76y.o. female with medical history significant for lambda light chain multiple myeloma who presents for a follow up visit. The patient's last visit was on 05/16/2022. In the interim since she has continued on her revlimid without difficulty.   On exam today Doris Wilkerson reports she is been tolerating Revlimid maintenance quite well without difficulty.  She reports that her weight has dropped ever so slightly in the interim since her last visit though her appetite has been quite good.  She reports that she has been cooking better which helps to increase her appetite.  She reports that she still does skip meals on occasion.  She reports that she has been having a craving for popcorn.  She notes overall that she  feels quite good.  She is excited about upcoming [employer]  She is not having any issues with pain.  She notes that there are no limitations in her day-to-day activity and overall she is quite content.  She denies fevers, chills, night sweats, shortness of breath, chest pain or cough. She hsa no other complaints. A full 10 point ROS is listed below.   MEDICAL HISTORY:  Past Medical History:  Diagnosis Date   Asthma    Cataract    GERD (gastroesophageal reflux disease)    Hyperlipidemia    Hypertension    Thyroid disease     SURGICAL HISTORY: Past Surgical History:  Procedure Laterality Date   Cataract Surgery     CORONARY ANGIOPLASTY WITH STENT PLACEMENT     EYE SURGERY     ORTHOPEDIC SURGERY  2012   TONSILLECTOMY  1951    SOCIAL HISTORY: Social History   Socioeconomic History   Marital status: Married    Spouse name: Not on file   Number of children: 0   Years of education: Not on file   Highest education level: Not on file  Occupational History   Occupation: unemployed  Tobacco Use   Smoking status: Never   Smokeless tobacco: Never  Vaping Use   Vaping Use: Never used  Substance and Sexual Activity   Alcohol use: Yes    Alcohol/week: 1.0 standard drink of alcohol    Types: 1 Glasses of wine per week    Comment: occassionally   Drug use: No   Sexual activity: Not Currently  Other Topics Concern   Not on file  Social History  Narrative   Married. Education: college. Pt does exercise-   Social Determinants of Health   Financial Resource Strain: Low Risk  (04/03/2022)   Overall Financial Resource Strain (CARDIA)    Difficulty of Paying Living Expenses: Not hard at all  Food Insecurity: No Food Insecurity (04/03/2022)   Hunger Vital Sign    Worried About Running Out of Food in the Last Year: Never true    Ran Out of Food in the Last Year: Never true  Transportation Needs: No Transportation Needs (04/03/2022)   PRAPARE - Radiographer, therapeutic (Medical): No    Lack of Transportation (Non-Medical): No  Physical Activity: Sufficiently Active (04/03/2022)   Exercise Vital Sign    Days of Exercise per Week: 3 days    Minutes of Exercise per Session: 50 min  Stress: No Stress Concern Present (04/03/2022)   [place]    Feeling of Stress : Not at all  Social Connections: Moderately Integrated (04/03/2022)   Social Connection and Isolation Panel [NHANES]    Frequency of Communication with Friends and Family: More than three times a week    Frequency of Social Gatherings with Friends and Family: More than three times a week    Attends Religious Services: Never    Marine scientist or Organizations: Yes    Attends Music therapist: More than 4 times per year    Marital Status: Married  Human resources officer Violence: Not At Risk (04/03/2022)   Humiliation, Afraid, Rape, and Kick questionnaire    Fear of Current or Ex-Partner: No    Emotionally Abused: No    Physically Abused: No    Sexually Abused: No    FAMILY HISTORY: Family History  Problem Relation Age of Onset   Cancer Father    Heart disease Brother    Dementia Brother    Leukemia Brother    Breast cancer Neg Hx    Colon cancer Neg Hx    Esophageal cancer Neg Hx    Pancreatic cancer Neg Hx    Rectal cancer Neg Hx    Stomach cancer Neg Hx     ALLERGIES:  is allergic to poison ivy extract, plavix [clopidogrel bisulfate], and other.  MEDICATIONS:  Current Outpatient Medications  Medication Sig Dispense Refill   acetaminophen (TYLENOL 8 HOUR ARTHRITIS PAIN) 650 MG CR tablet Take 1 tablet (650 mg total) by mouth every 8 (eight) hours as needed for pain. (Patient not taking: Reported on 04/03/2022)     acetaminophen (TYLENOL) 500 MG tablet Take 500 mg by mouth every 6 (six) hours as needed for mild pain. Take 3 times daily     aspirin 81 MG tablet Take 81 mg by mouth daily.       atorvastatin (LIPITOR) 80 MG tablet TAKE 1 TABLET(80 MG) BY MOUTH DAILY AT 6 PM 90 tablet 1   benazepril (LOTENSIN) 10 MG tablet TAKE 1 TABLET(10 MG) BY MOUTH DAILY 90 tablet 1   calcium carbonate (TUMS - DOSED IN MG ELEMENTAL CALCIUM) 500 MG chewable tablet Chew 1 tablet by mouth daily. daily     cholecalciferol (VITAMIN D3) 25 MCG (1000 UNIT) tablet Take 2,000 Units by mouth daily.     famotidine-calcium carbonate-magnesium hydroxide (PEPCID COMPLETE) 10-800-165 MG chewable tablet Chew 1 tablet by mouth daily. (Patient not taking: Reported on 04/03/2022)     gabapentin (NEURONTIN) 100 MG capsule Take 1 capsule (100 mg total) by mouth 2 (  two) times daily as needed. 180 capsule 1   lenalidomide (REVLIMID) 10 MG capsule TAKE 1 CAPSULE BY MOUTH DAILY  FOR 21 DAYS, THEN 7 DAYS OFF [employer] Auth # [id number]  Date Obtained: 05/26/22 21 capsule 0   levothyroxine (SYNTHROID) 25 MCG tablet TAKE 1 TABLET(25 MCG) BY MOUTH DAILY BEFORE BREAKFAST 90 tablet 3   MAGNESIUM PO Take 250 mg by mouth daily.     Zoledronic Acid (ZOMETA IV) Inject into the vein. Every 3 months     No current facility-administered medications for this visit.    REVIEW OF SYSTEMS:   Constitutional: ( - ) fevers, ( - )  chills , ( - ) night sweats Eyes: ( - ) blurriness of vision, ( - ) double vision, ( - ) watery eyes Ears, nose, mouth, throat, and face: ( - ) mucositis, ( - ) sore throat Respiratory: ( - ) cough, ( - ) dyspnea, ( - ) wheezes Cardiovascular: ( - ) palpitation, ( - ) chest discomfort, ( - ) lower extremity swelling Gastrointestinal:  ( - ) nausea, ( - ) heartburn, ( - ) change in bowel habits Skin: ( - ) abnormal skin rashes Lymphatics: ( - ) new lymphadenopathy, ( - ) easy bruising Neurological: ( +) numbness, ( - ) tingling, ( - ) new weaknesses Behavioral/Psych: ( - ) mood change, ( - ) new changes  All other systems were reviewed with the patient and are negative.  PHYSICAL EXAMINATION: ECOG PERFORMANCE STATUS: 1  - Symptomatic but completely ambulatory  Vitals:   06/12/22 1109  BP: 113/68  Pulse: 63  Resp: 18  Temp: 97.7 F (36.5 C)  SpO2: 100%     Filed Weights   06/12/22 1109  Weight: 137 lb 6.4 oz (62.3 kg)     GENERAL: well appearing elderly Caucasian female in NAD  SKIN: skin color, texture, turgor are normal, no rashes or significant lesions EYES: conjunctiva are pink and non-injected, sclera clear LUNGS: clear to auscultation and percussion with normal breathing effort HEART: regular rate & rhythm and no murmurs and trace lower extremity edema Musculoskeletal: no cyanosis of digits and no clubbing  PSYCH: alert & oriented x 3, fluent speech NEURO: no focal motor/sensory deficits  LABORATORY DATA:  I have reviewed the data as listed    Latest Ref Rng & Units 06/12/2022   10:31 AM 05/16/2022    2:09 PM 04/15/2022   10:25 AM  CBC  WBC 4.0 - 10.5 K/uL 4.5  4.1  5.3   Hemoglobin 12.0 - 15.0 g/dL 12.6  12.6  13.1   Hematocrit 36.0 - 46.0 % 38.4  38.4  39.6   Platelets 150 - 400 K/uL 223  239  266        Latest Ref Rng & Units 06/12/2022   10:31 AM 05/16/2022    2:09 PM 04/15/2022   10:25 AM  CMP  Glucose 70 - 99 mg/dL 89  92  91   BUN 8 - 23 mg/dL '13  13  15   ' Creatinine 0.44 - 1.00 mg/dL 0.81  0.78  0.83   Sodium 135 - 145 mmol/L 135  138  137   Potassium 3.5 - 5.1 mmol/L 3.7  4.1  3.9   Chloride 98 - 111 mmol/L 104  106  104   CO2 22 - 32 mmol/L '26  28  28   ' Calcium 8.9 - 10.3 mg/dL 9.4  9.8  9.7   Total Protein 6.5 - 8.1  g/dL 7.0  6.8  7.3   Total Bilirubin 0.3 - 1.2 mg/dL 0.7  0.5  0.8   Alkaline Phos 38 - 126 U/L 46  52  56   AST 15 - 41 U/L '28  23  22   ' ALT 0 - 44 U/L '30  26  24     ' Lab Results  Component Value Date   MPROTEIN Not Observed 05/19/2022   MPROTEIN Not Observed 04/15/2022   MPROTEIN Not Observed 03/14/2022   Lab Results  Component Value Date   KPAFRELGTCHN 25.7 (H) 06/12/2022   KPAFRELGTCHN 29.4 (H) 05/19/2022   KPAFRELGTCHN 27.9 (H)  04/15/2022   LAMBDASER 39.7 (H) 06/12/2022   LAMBDASER 40.2 (H) 05/19/2022   LAMBDASER 41.0 (H) 04/15/2022   KAPLAMBRATIO 0.65 06/12/2022   KAPLAMBRATIO 0.73 05/19/2022   KAPLAMBRATIO 0.68 04/15/2022    RADIOGRAPHIC STUDIES: No results found.  ASSESSMENT & PLAN Poetry Cerro 76 y.o. female with medical history significant for lambda light chain multiple myeloma who presents for a follow up visit.   After review the labs, review the records, schedule the patient the findings most consistent with a lambda light chain multiple myeloma.  This is getting confirmed with a bone marrow biopsy showing an abnormal plasma cell population of 28% and M protein/Bence-Jones proteins in the urine.  Given these findings the patient now carries a diagnosis of lambda light chain multiple myeloma.  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  This consists of bortezomib 1.3 mg per metered squared q week, lenalidomide 25 mg p.o. x14 days of 21-day cycle, and dexamethasone 40 mg p.o. q. weekly.  This will be continued until he reached a VGPR/ complete 8 cycles of triplet therapy, at which time we can consider transition over to maintenance Revlimid 10 mg p.o. daily 21 of 28 days per cycle.  Ms. Sliwinski is not a candidate for bone marrow transplant based on her advanced age.  She transition to maintenance Revlimid on 07/19/2021.  # Lambda Light Chain Multiple Myeloma--VGPR on maintenance --diagnosis confirmed with bone marrow biopsy and urine protein analysis.   --patient has good functional status and would be an excellent candidate for VRd chemotherapy. I do not believe she would be a bone marrow transplant candidate based on her age.  --assure monthly restaging labs with SPEP, UPEP, and SFLC --patient declines port placement at this time --Cycle 1 Day 1 of therapy started on 02/01/2021.  --she completed Cycle 8 Day 1 of Vrd, started maintenance Revlimid on 07/19/2021. --Labs  from today were reviewed and require no intervention. SPEP and sFLC pending.  Plan: -- Continue on maintenance revlimid 23m PO daily  --Monthly restaging labs with serum free light chains, UPEP, and SPEP. Last SPEP showed undetectable M protein and SFLC show a normal ratio --Labs today show white blood cell count 4.5, hemoglobin 12.6, MCV 89.3, and platelets of 223.  Creatinine 0.81 --Return to clinic every 4 weeks  #Back Pain -- Currently well controlled on gabapentin and Tylenol --CT thoracic and lumbar spine showed no lytic lesions or sequelae of multiple myeloma --Continue to monitor  #Supportive Care --chemotherapy education complete  --zofran [id number] PRN and compazine 1069mO q6H for nausea --acyclovir can be d/c --zometa therapy performed on 03/14/2022. Repeat q 3 months.  -- no pain medication required at this time.   No orders of the defined types were placed in this encounter.  All questions were answered. The patient knows to call  the clinic with any problems, questions or concerns.  I have spent a total of 30 minutes minutes of face-to-face and non-face-to-face time, preparing to see the patient, performing a medically appropriate examination, counseling and educating the patient, documenting clinical information in the electronic health record,   Orson Slick, MD Department of Hematology/Oncology [place] [facility] Phone: [phone]  06/15/2022 12:55 PM   Literature Support:  1) Bennett Scrape. Bortezomib, lenalidomide, and dexamethasone in transplant-eligible newly diagnosed multiple myeloma patients: a multicenter retrospective comparative analysis. Int Hinton Dyer. 2020 Jan;111(1):103-111. doi: 10.1007/s12185-019-02764-1.  -- Overall response, very good partial response, and complete response rates after VRD were 96.4%,  45.5%, and 20.0%, respectively (median follow-up period, 17.7 months). The 1-year progression-free survival (PFS) and overall survival rates were 95.8% and 98.2%, respectively. The response rate and PFS were similar between the groups, regardless of cytogenetic risk and age.  2) Casandra Doffing, [address] FE, Pawlyn Loletha Grayer, [place], [place], [place], [place] A, Jones JR, Kishore B, Garg M, Williams CD, Karunanithi Delaine Lame MW, Laban Emperor [place], [place], [place] MT, Robinette Haines WM, Myra Rude; [place] [facility] [place]. Lenalidomide maintenance versus observation for patients with newly diagnosed multiple myeloma (Myeloma XI): a multicentre, open-label, randomised, phase 3 trial. Lancet Oncol. 2019 Jan;20(1):57-73. doi: 10.1016/S1470-2045(18)30687-9. Epub 2018 Dec 14.   --Maintenance therapy with lenalidomide significantly improved progression-free survival in patients with newly diagnosed multiple myeloma compared with observation   --After a median follow-up of 31 months (IQR 18-50), median progression-free survival was 39 months (95% CI 36-42) with lenalidomide and 20 months (18-22) with observation (hazard ratio [HR] 046 [95% CI 041-053]; p<00001), and 3-year overall survival was 786% (95% Cl 756-816) in the lenalidomide group and 758% (925-241) in the observation group (HR 087 [95% CI 073-105]; p=015). Progression-free survival was improved with lenalidomide compared with observation across all prespecified subgroups.

## 2022-06-16 ENCOUNTER — Other Ambulatory Visit: Payer: Self-pay

## 2022-06-16 DIAGNOSIS — Z23 Encounter for immunization: Secondary | ICD-10-CM | POA: Diagnosis not present

## 2022-06-17 LAB — UPEP/UIFE/LIGHT CHAINS/TP, 24-HR UR
% BETA, Urine: 0 %
ALPHA 1 URINE: 0 %
Albumin, U: 100 %
Alpha 2, Urine: 0 %
Free Kappa Lt Chains,Ur: 11.39 mg/L (ref 1.17–86.46)
Free Kappa/Lambda Ratio: 8.5 (ref 1.83–14.26)
Free Lambda Lt Chains,Ur: 1.34 mg/L (ref 0.27–15.21)
GAMMA GLOBULIN URINE: 0 %
Total Protein, Urine-Ur/day: <94 mg/24 hr (ref 30–150)
Total Protein, Urine: <4 mg/dL
Total Volume: 2350

## 2022-06-17 LAB — MULTIPLE MYELOMA PANEL, SERUM
Albumin SerPl Elph-Mcnc: 4 g/dL (ref 2.9–4.4)
Albumin/Glob SerPl: 1.6 (ref 0.7–1.7)
Alpha 1: 0.2 g/dL (ref 0.0–0.4)
Alpha2 Glob SerPl Elph-Mcnc: 0.7 g/dL (ref 0.4–1.0)
B-Globulin SerPl Elph-Mcnc: 0.9 g/dL (ref 0.7–1.3)
Gamma Glob SerPl Elph-Mcnc: 0.8 g/dL (ref 0.4–1.8)
Globulin, Total: 2.6 g/dL (ref 2.2–3.9)
IgA: 176 mg/dL (ref 64–422)
IgG (Immunoglobin G), Serum: 837 mg/dL (ref 586–1602)
IgM (Immunoglobulin M), Srm: 23 mg/dL — ABNORMAL LOW (ref 26–217)
Total Protein ELP: 6.6 g/dL (ref 6.0–8.5)

## 2022-06-18 ENCOUNTER — Encounter: Payer: Self-pay | Admitting: *Deleted

## 2022-06-25 ENCOUNTER — Other Ambulatory Visit: Payer: Self-pay | Admitting: Family Medicine

## 2022-06-25 ENCOUNTER — Other Ambulatory Visit: Payer: Self-pay | Admitting: *Deleted

## 2022-06-25 ENCOUNTER — Other Ambulatory Visit: Payer: Self-pay | Admitting: Hematology and Oncology

## 2022-06-25 DIAGNOSIS — E782 Mixed hyperlipidemia: Secondary | ICD-10-CM

## 2022-06-25 MED ORDER — LENALIDOMIDE 10 MG PO CAPS: ORAL_CAPSULE | ORAL | 0 refills | Status: DC

## 2022-06-26 DIAGNOSIS — C9001 Multiple myeloma in remission: Secondary | ICD-10-CM | POA: Diagnosis not present

## 2022-07-03 ENCOUNTER — Encounter: Payer: Self-pay | Admitting: Family Medicine

## 2022-07-03 ENCOUNTER — Ambulatory Visit (INDEPENDENT_AMBULATORY_CARE_PROVIDER_SITE_OTHER): Payer: Medicare Other | Admitting: Family Medicine

## 2022-07-03 VITALS — BP 118/60 | HR 62 | Temp 97.9°F | Resp 16 | Ht 66.0 in | Wt 136.4 lb

## 2022-07-03 DIAGNOSIS — C9001 Multiple myeloma in remission: Secondary | ICD-10-CM | POA: Diagnosis not present

## 2022-07-03 DIAGNOSIS — Z1231 Encounter for screening mammogram for malignant neoplasm of breast: Secondary | ICD-10-CM

## 2022-07-03 DIAGNOSIS — R634 Abnormal weight loss: Secondary | ICD-10-CM | POA: Diagnosis not present

## 2022-07-03 DIAGNOSIS — I10 Essential (primary) hypertension: Secondary | ICD-10-CM

## 2022-07-03 DIAGNOSIS — E039 Hypothyroidism, unspecified: Secondary | ICD-10-CM

## 2022-07-03 DIAGNOSIS — M8589 Other specified disorders of bone density and structure, multiple sites: Secondary | ICD-10-CM

## 2022-07-03 DIAGNOSIS — G6289 Other specified polyneuropathies: Secondary | ICD-10-CM

## 2022-07-03 DIAGNOSIS — E782 Mixed hyperlipidemia: Secondary | ICD-10-CM | POA: Diagnosis not present

## 2022-07-03 DIAGNOSIS — I2510 Atherosclerotic heart disease of native coronary artery without angina pectoris: Secondary | ICD-10-CM | POA: Diagnosis not present

## 2022-07-03 LAB — LIPID PANEL
Cholesterol: 113 mg/dL (ref 0–200)
HDL: 51.2 mg/dL (ref >39.00)
LDL Cholesterol: 44 mg/dL (ref 0–99)
NonHDL: 61.7
Total CHOL/HDL Ratio: 2
Triglycerides: 91 mg/dL (ref 0.0–149.0)
VLDL: 18.2 mg/dL (ref 0.0–40.0)

## 2022-07-03 LAB — TSH: TSH: 1.02 u[IU]/mL (ref 0.35–5.50)

## 2022-07-03 MED ORDER — GABAPENTIN 100 MG PO CAPS: 100.0000 mg | ORAL_CAPSULE | Freq: Two times a day (BID) | ORAL | 1 refills | Status: DC | PRN

## 2022-07-03 MED ORDER — LEVOTHYROXINE SODIUM 25 MCG PO TABS: ORAL_TABLET | ORAL | 3 refills | Status: DC

## 2022-07-03 MED ORDER — BENAZEPRIL HCL 10 MG PO TABS
ORAL_TABLET | ORAL | 1 refills | Status: DC
Start: 2022-07-03 — End: 2022-11-18

## 2022-07-03 NOTE — Progress Notes (Signed)
Subjective:  Patient ID: Doris Wilkerson, female    DOB: 25-Oct-1946  Age: 76 y.o. MRN: [id number]  CC:  Chief Complaint  Patient presents with   Hypothyroidism   Hyperlipidemia   vitamin d    Recommend level of vitamin D supplementation    Hypertension    HPI [name] presents for   Multiple myeloma Treated by Dr. Lorenso Courier, now on Revlimid maintenance, multiple myeloma in remission.  Last visit July 20.  Back pain and neuropathy treated with gabapentin, Tylenol.  Status post CT of thoracic and lumbar spine,  with lesions in various vertebrae. Some bone pain at times if prolonged sitting primarily. Taking tylenol TID, declines other meds. Gabapentin BID doing well for nerve pain. Trying yoga to help as well. Recently seen by Dr. Saintclair Halsted with neurosurgery, plan for MRI on 8/14 to decide on other treatments.   Hypothyroidism: Lab Results  Component Value Date   TSH 1.01 12/10/2021  Taking medication daily.  Synthroid 25 mcg. No new hot or cold intolerance. No new hair or skin changes, heart palpitations or new fatigue. Losing weight. Eating well. Did see nutritionist. Discussed with Dr. Lorenso Courier- possible reset to new normal.  Diarrhea on 3rd week of revlimid - will use Immodium that helps, but some weight loss with diarrhea.  No abdominal pain, distension, or change in waist size.  Due for repeat mammogram.   Wt Readings from Last 3 Encounters:  07/03/22 136 lb 6.4 oz (61.9 kg)  06/12/22 137 lb 6.4 oz (62.3 kg)  05/16/22 138 lb 14.4 oz (63 kg)    Hypertension: Benazepril 10 mg daily.no new side effects.  Home readings: none.  BP Readings from Last 3 Encounters:  07/03/22 118/60  06/12/22 (!) 116/55  06/12/22 113/68   Lab Results  Component Value Date   CREATININE 0.81 06/12/2022   Hyperlipidemia: Lipitor 80 mg daily.  History of CAD, cardiologist Dr. Einar Gip. No new myalgias, no chest pain or dyspnea with exertion. Last ate 3 hrs ago.  Lab Results  Component Value  Date   CHOL 136 12/10/2021   HDL 61.20 12/10/2021   LDLCALC 62 12/10/2021   TRIG 65.0 12/10/2021   CHOLHDL 2 12/10/2021   Lab Results  Component Value Date   ALT 30 06/12/2022   AST 28 06/12/2022   ALKPHOS 46 06/12/2022   BILITOT 0.7 06/12/2022   Osteopenia Bone density August 2021.  T-score -1.5 at [facility] spine.  Improved from -1.9 in 2018.  Right femur had worsened from -1.0 to -1.4 since 2018.  Dual femur stable at -1.0.   Currently taking 1000iu per day. Calcium - no supplement.   History Patient Active Problem List   Diagnosis Date Noted   Physical debility 03/21/2021   Dizziness 03/21/2021   Vertigo 03/21/2021   Anemia due to antineoplastic chemotherapy 03/21/2021   Skin changes related to chemotherapy 03/21/2021   Other constipation 03/21/2021   Multiple myeloma not having achieved remission ([place]) 01/27/2021   Hordeolum externum of left lower eyelid 06/28/2018   CAD (coronary artery disease) 12/09/2011   Dyslipidemia 12/09/2011   Osteopenia 12/09/2011   Hypothyroid 12/09/2011   Asthma 12/09/2011   Past Medical History:  Diagnosis Date   Asthma    Cataract    GERD (gastroesophageal reflux disease)    Hyperlipidemia    Hypertension    Thyroid disease    Past Surgical History:  Procedure Laterality Date   Cataract Surgery     CORONARY ANGIOPLASTY WITH STENT PLACEMENT  EYE SURGERY     ORTHOPEDIC SURGERY  2012   TONSILLECTOMY  1951   Allergies  Allergen Reactions   Poison Ivy Extract Swelling   Plavix [Clopidogrel Bisulfate]    Other     seasonal   Prior to Admission medications   Medication Sig Start Date End Date Taking? Authorizing Provider  acetaminophen (TYLENOL 8 HOUR ARTHRITIS PAIN) 650 MG CR tablet Take 1 tablet (650 mg total) by mouth every 8 (eight) hours as needed for pain. Patient not taking: Reported on 04/03/2022 01/03/21   Orson Slick, MD  acetaminophen (TYLENOL) 500 MG tablet Take 500 mg by mouth every 6 (six) hours as needed for  mild pain. Take 3 times daily    [provider]  aspirin 81 MG tablet Take 81 mg by mouth daily.     Hayden Rasmussen, MD  atorvastatin (LIPITOR) 80 MG tablet TAKE 1 TABLET(80 MG) BY MOUTH DAILY AT 6 PM 06/26/22   [name], MD  benazepril (LOTENSIN) 10 MG tablet TAKE 1 TABLET(10 MG) BY MOUTH DAILY 05/19/22   [name], MD  calcium carbonate (TUMS - DOSED IN MG ELEMENTAL CALCIUM) 500 MG chewable tablet Chew 1 tablet by mouth daily. daily    [provider]  cholecalciferol (VITAMIN D3) 25 MCG (1000 UNIT) tablet Take 2,000 Units by mouth daily.    [provider]  famotidine-calcium carbonate-magnesium hydroxide (PEPCID COMPLETE) 10-800-165 MG chewable tablet Chew 1 tablet by mouth daily. Patient not taking: Reported on 04/03/2022    [provider]  gabapentin (NEURONTIN) 100 MG capsule Take 1 capsule (100 mg total) by mouth 2 (two) times daily as needed. 01/02/22   [name], MD  lenalidomide (REVLIMID) 10 MG capsule TAKE 1 CAPSULE BY MOUTH DAILY  FOR 21 DAYS, THEN 7 DAYS OFF [employer] Auth #  [id number] Date Obtained: 06/25/22 06/25/22   Orson Slick, MD  levothyroxine (SYNTHROID) 25 MCG tablet TAKE 1 TABLET(25 MCG) BY MOUTH DAILY BEFORE BREAKFAST 11/23/21   [name], MD  MAGNESIUM PO Take 250 mg by mouth daily.    [provider]  Zoledronic Acid (ZOMETA IV) Inject into the vein. Every 3 months    [provider]   Social History   Socioeconomic History   Marital status: Married    Spouse name: Not on file   Number of children: 0   Years of education: Not on file   Highest education level: Not on file  Occupational History   Occupation: unemployed  Tobacco Use   Smoking status: Never   Smokeless tobacco: Never  Vaping Use   Vaping Use: Never used  Substance and Sexual Activity   Alcohol use: Yes    Alcohol/week: 1.0 standard drink of alcohol    Types: 1 Glasses of wine per week    Comment: occassionally    Drug use: No   Sexual activity: Not Currently  Other Topics Concern   Not on file  Social History Narrative   Married. Education: college. Pt does exercise-   Social Determinants of Health   Financial Resource Strain: Low Risk  (04/03/2022)   Overall Financial Resource Strain (CARDIA)    Difficulty of Paying Living Expenses: Not hard at all  Food Insecurity: No Food Insecurity (04/03/2022)   Hunger Vital Sign    Worried About Running Out of Food in the Last Year: Never true    Ran Out of Food in the Last Year: Never true  Transportation Needs: No Transportation Needs (04/03/2022)   PRAPARE - Hydrologist (Medical): No    Lack of Transportation (Non-Medical): No  Physical Activity: Sufficiently Active (04/03/2022)   Exercise Vital Sign    Days of Exercise per Week: 3 days    Minutes of Exercise per Session: 50 min  Stress: No Stress Concern Present (04/03/2022)   [place]    Feeling of Stress : Not at all  Social Connections: Moderately Integrated (04/03/2022)   Social Connection and Isolation Panel [NHANES]    Frequency of Communication with Friends and Family: More than three times a week    Frequency of Social Gatherings with Friends and Family: More than three times a week    Attends Religious Services: Never    Marine scientist or Organizations: Yes    Attends Music therapist: More than 4 times per year    Marital Status: Married  Human resources officer Violence: Not At Risk (04/03/2022)   Humiliation, Afraid, Rape, and Kick questionnaire    Fear of Current or Ex-Partner: No    Emotionally Abused: No    Physically Abused: No    Sexually Abused: No    Review of Systems  Constitutional:  Negative for fatigue and unexpected weight change.  Respiratory:  Negative for chest tightness and shortness of breath.   Cardiovascular:  Negative for chest pain, palpitations and  leg swelling.  Gastrointestinal:  Negative for abdominal pain and blood in stool.  Neurological:  Negative for dizziness, syncope, light-headedness and headaches.     Objective:   Vitals:   07/03/22 1027  BP: 118/60  Pulse: 62  Resp: 16  Temp: 97.9 F (36.6 C)  TempSrc: Oral  SpO2: 99%  Weight: 136 lb 6.4 oz (61.9 kg)  Height: '5\' 6"'  (1.676 m)     Physical Exam Vitals reviewed.  Constitutional:      Appearance: Normal appearance. She is well-developed.  HENT:     Head: Normocephalic and atraumatic.  Eyes:     Conjunctiva/sclera: Conjunctivae normal.     Pupils: Pupils are equal, round, and reactive to light.  Neck:     Vascular: No carotid bruit.  Cardiovascular:     Rate and Rhythm: Normal rate and regular rhythm.     Heart sounds: Normal heart sounds.  Pulmonary:     Effort: Pulmonary effort is normal.     Breath sounds: Normal breath sounds.  Abdominal:     Palpations: Abdomen is soft. There is no pulsatile mass.     Tenderness: There is no abdominal tenderness.  Musculoskeletal:     Right lower leg: No edema.     Left lower leg: No edema.  Skin:    General: Skin is warm and dry.  Neurological:     Mental Status: She is alert and oriented to person, place, and time.  Psychiatric:        Mood and Affect: Mood normal.        Behavior: Behavior normal.        Assessment & Plan:  Rilda Bulls is a 76 y.o. female . Multiple myeloma in remission ([place]) Other polyneuropathy - Plan: gabapentin (NEURONTIN) 100 MG capsule  -Continue follow-up with hematology, on Revlimid. - Question of weight loss discussed with hematology and thought to be possible new normal.  Will check TSH.  Follow-up if persistent weight loss.  Check mammogram but other cancer screening appears to  be up-to-date.  - Follow-up with neurosurgery regarding MRI and further treatment for back pain.  Option of stronger pain med if needed.  Hypothyroidism, unspecified type - Plan:  levothyroxine (SYNTHROID) 25 MCG tablet, TSH  -  Stable, tolerating current regimen. Medications refilled. Labs pending as above.   Hyperlipemia, mixed -   -Tolerating Lipitor at current dose, continue same.  Check lipid panel with medication adjustments accordingly.  Essential hypertension - Plan: benazepril (LOTENSIN) 10 MG tablet  -  Stable, tolerating current regimen. Medications refilled. Labs pending as above.   Encounter for screening mammogram for malignant neoplasm of breast - Plan: MM Digital Screening  Osteopenia of multiple sites - Plan: DG Bone Density  -Check updated bone density, calcium and vitamin D supplementation discussed as above.  Weight loss  -As above, RTC precautions if persistent weight decline.  Status post nutrition eval.  No orders of the defined types were placed in this encounter.  Patient Instructions  Keep follow up for MRI and with Dr. Saintclair Halsted as planned, but let me know if stronger med needed. Same dose of meds for now, including vitamin D. Make sure you are getting 1200-1560m calcium per day total.      Signed,   JMerri Ray MD [facility] 07/03/22 10:55 AM

## 2022-07-03 NOTE — Patient Instructions (Addendum)
Keep follow up for MRI and with Dr. Saintclair Halsted as planned, but let me know if stronger med needed. Same dose of meds for now, including vitamin D. Make sure you are getting 1200-'1500mg'$  calcium per day total.

## 2022-07-07 DIAGNOSIS — M47816 Spondylosis without myelopathy or radiculopathy, lumbar region: Secondary | ICD-10-CM | POA: Diagnosis not present

## 2022-07-07 DIAGNOSIS — C9001 Multiple myeloma in remission: Secondary | ICD-10-CM | POA: Diagnosis not present

## 2022-07-11 ENCOUNTER — Inpatient Hospital Stay: Payer: Medicare Other | Attending: Physician Assistant | Admitting: Physician Assistant

## 2022-07-11 ENCOUNTER — Other Ambulatory Visit: Payer: Self-pay

## 2022-07-11 ENCOUNTER — Inpatient Hospital Stay: Payer: Medicare Other

## 2022-07-11 VITALS — BP 105/58 | HR 63 | Temp 99.1°F | Resp 15 | Wt 137.8 lb

## 2022-07-11 DIAGNOSIS — Z79899 Other long term (current) drug therapy: Secondary | ICD-10-CM | POA: Insufficient documentation

## 2022-07-11 DIAGNOSIS — C9001 Multiple myeloma in remission: Secondary | ICD-10-CM | POA: Diagnosis not present

## 2022-07-11 DIAGNOSIS — C9000 Multiple myeloma not having achieved remission: Secondary | ICD-10-CM | POA: Insufficient documentation

## 2022-07-11 DIAGNOSIS — Z7982 Long term (current) use of aspirin: Secondary | ICD-10-CM | POA: Diagnosis not present

## 2022-07-11 LAB — CBC WITH DIFFERENTIAL (CANCER CENTER ONLY)
Abs Immature Granulocytes: 0.01 10*3/uL (ref 0.00–0.07)
Basophils Absolute: 0.1 10*3/uL (ref 0.0–0.1)
Basophils Relative: 2 %
Eosinophils Absolute: 0.4 10*3/uL (ref 0.0–0.5)
Eosinophils Relative: 8 %
HCT: 36.4 % (ref 36.0–46.0)
Hemoglobin: 12 g/dL (ref 12.0–15.0)
Immature Granulocytes: 0 %
Lymphocytes Relative: 24 %
Lymphs Abs: 1.2 10*3/uL (ref 0.7–4.0)
MCH: 28.9 pg (ref 26.0–34.0)
MCHC: 33 g/dL (ref 30.0–36.0)
MCV: 87.7 fL (ref 80.0–100.0)
Monocytes Absolute: 0.3 10*3/uL (ref 0.1–1.0)
Monocytes Relative: 6 %
Neutro Abs: 2.9 10*3/uL (ref 1.7–7.7)
Neutrophils Relative %: 60 %
Platelet Count: 243 10*3/uL (ref 150–400)
RBC: 4.15 MIL/uL (ref 3.87–5.11)
RDW: 15.1 % (ref 11.5–15.5)
WBC Count: 4.8 10*3/uL (ref 4.0–10.5)
nRBC: 0 % (ref 0.0–0.2)

## 2022-07-11 LAB — CMP (CANCER CENTER ONLY)
ALT: 29 U/L (ref 0–44)
AST: 26 U/L (ref 15–41)
Albumin: 4.2 g/dL (ref 3.5–5.0)
Alkaline Phosphatase: 53 U/L (ref 38–126)
Anion gap: 4 — ABNORMAL LOW (ref 5–15)
BUN: 11 mg/dL (ref 8–23)
CO2: 24 mmol/L (ref 22–32)
Calcium: 9.4 mg/dL (ref 8.9–10.3)
Chloride: 111 mmol/L (ref 98–111)
Creatinine: 0.72 mg/dL (ref 0.44–1.00)
GFR, Estimated: >60 mL/min (ref >60)
Glucose, Bld: 107 mg/dL — ABNORMAL HIGH (ref 70–99)
Potassium: 4 mmol/L (ref 3.5–5.1)
Sodium: 139 mmol/L (ref 135–145)
Total Bilirubin: 0.6 mg/dL (ref 0.3–1.2)
Total Protein: 6.3 g/dL — ABNORMAL LOW (ref 6.5–8.1)

## 2022-07-11 LAB — LACTATE DEHYDROGENASE: LDH: 156 U/L (ref 98–192)

## 2022-07-11 NOTE — Progress Notes (Signed)
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 Concerning for multiple myeloma vs metastatic disease 12/31/2020: establish care with Dr. Lorenso Wilkerson. UPEP showed marked Bence Jones protein, findings concerning for multiple myleoma 01/21/2021: bone marrow biopsy confirms multiple myeloma with atypical plasma cells representing 28% of all cells in the aspirate  02/01/2021: Cycle 1 Day 1 of VRd chemotherapy 02/22/2021: Cycle 2 Day 1 of VRd chemotherapy 03/15/2021: Cycle 3 Day 1 of VRd chemotherapy 04/05/2021: Cycle 4 Day 1 of VRd chemotherapy 04/26/2021: Cycle 5 Day 1 of VRd chemotherapy 05/17/2021: Cycle 6 Day 1 of VRd chemotherapy 06/07/2021:  Cycle 7 Day 1 of VRd chemotherapy 06/28/2021: Cycle 8 Day 1 of VRd chemotherapy 07/19/2021: start maintenance revlimid 54m PO daily.   Interval History:  [name]89y.o. female with medical history significant for lambda light chain multiple myeloma who presents for a follow up visit. The patient's last visit was on 06/12/2022.  On exam today Doris Wilkerson reports she continues to do well without any new symptoms. She is tolerating Revlimid without any prohibitive toxicities. Her energy and appetite are stable. She is able to complete all her daily activities on her own. She denies nausea, vomiting or abdominal pain. Her bowel habits are unchanged without any recurrent episodes of diarrhea or constipation. She denies easy bruising or signs of bleeding. She denies fevers, chills, night sweats, shortness of breath, chest pain or cough. She hsa no other complaints. A full  10 point ROS is listed below.   MEDICAL HISTORY:  Past Medical History:  Diagnosis Date   Asthma    Cataract    GERD (gastroesophageal reflux disease)    Hyperlipidemia    Hypertension    Thyroid disease     SURGICAL HISTORY: Past Surgical History:  Procedure Laterality Date   Cataract Surgery     CORONARY ANGIOPLASTY WITH STENT PLACEMENT     EYE SURGERY     ORTHOPEDIC SURGERY  2012   TONSILLECTOMY  1951    SOCIAL HISTORY: Social History   Socioeconomic History   Marital status: Married    Spouse name: Not on file   Number of children: 0   Years of education: Not on file   Highest education level: Not on file  Occupational History   Occupation: unemployed  Tobacco Use   Smoking status: Never   Smokeless tobacco: Never  Vaping Use   Vaping Use: Never used  Substance and Sexual Activity   Alcohol use: Yes    Alcohol/week: 1.0 standard drink of alcohol    Types: 1 Glasses of wine per week    Comment: occassionally   Drug use: No   Sexual activity: Not Currently  Other Topics Concern   Not on file  Social History Narrative   Married. Education: college. Pt does exercise-   Social Determinants of Health   Financial Resource Strain: Low Risk  (04/03/2022)   Overall Financial Resource Strain (CARDIA)    Difficulty of Paying Living Expenses: Not hard at all  Food Insecurity: No Food Insecurity (04/03/2022)   Hunger Vital Sign    Worried About Running Out of Food in the Last Year: Never  true    Ran Out of Food in the Last Year: Never true  Transportation Needs: No Transportation Needs (04/03/2022)   PRAPARE - Hydrologist (Medical): No    Lack of Transportation (Non-Medical): No  Physical Activity: Sufficiently Active (04/03/2022)   Exercise Vital Sign    Days of Exercise per Week: 3 days    Minutes of Exercise per Session: 50 min  Stress: No Stress Concern Present (04/03/2022)   [place]    Feeling of Stress : Not at all  Social Connections: Moderately Integrated (04/03/2022)   Social Connection and Isolation Panel [NHANES]    Frequency of Communication with Friends and Family: More than three times a week    Frequency of Social Gatherings with Friends and Family: More than three times a week    Attends Religious Services: Never    Marine scientist or Organizations: Yes    Attends Music therapist: More than 4 times per year    Marital Status: Married  Human resources officer Violence: Not At Risk (04/03/2022)   Humiliation, Afraid, Rape, and Kick questionnaire    Fear of Current or Ex-Partner: No    Emotionally Abused: No    Physically Abused: No    Sexually Abused: No    FAMILY HISTORY: Family History  Problem Relation Age of Onset   Cancer Father    Heart disease Brother    Dementia Brother    Leukemia Brother    Breast cancer Neg Hx    Colon cancer Neg Hx    Esophageal cancer Neg Hx    Pancreatic cancer Neg Hx    Rectal cancer Neg Hx    Stomach cancer Neg Hx     ALLERGIES:  is allergic to poison ivy extract, plavix [clopidogrel bisulfate], and other.  MEDICATIONS:  Current Outpatient Medications  Medication Sig Dispense Refill   acetaminophen (TYLENOL 8 HOUR ARTHRITIS PAIN) 650 MG CR tablet Take 1 tablet (650 mg total) by mouth every 8 (eight) hours as needed for pain.     acetaminophen (TYLENOL) 500 MG tablet Take 500 mg by mouth every 6 (six) hours as needed for mild pain. Take 3 times daily     aspirin 81 MG tablet Take 81 mg by mouth daily.      atorvastatin (LIPITOR) 80 MG tablet TAKE 1 TABLET(80 MG) BY MOUTH DAILY AT 6 PM 90 tablet 1   benazepril (LOTENSIN) 10 MG tablet TAKE 1 TABLET(10 MG) BY MOUTH DAILY 90 tablet 1   calcium carbonate (TUMS - DOSED IN MG ELEMENTAL CALCIUM) 500 MG chewable tablet Chew 1 tablet by mouth daily. Daily PRN     cholecalciferol (VITAMIN D3) 25 MCG (1000 UNIT) tablet Take 2,000 Units by  mouth daily.     famotidine-calcium carbonate-magnesium hydroxide (PEPCID COMPLETE) 10-800-165 MG chewable tablet Chew 1 tablet by mouth as needed.     gabapentin (NEURONTIN) 100 MG capsule Take 1 capsule (100 mg total) by mouth 2 (two) times daily as needed. 180 capsule 1   lenalidomide (REVLIMID) 10 MG capsule TAKE 1 CAPSULE BY MOUTH DAILY  FOR 21 DAYS, THEN 7 DAYS OFF [employer] Auth #  [id number] Date Obtained: 06/25/22 21 capsule 0   levothyroxine (SYNTHROID) 25 MCG tablet TAKE 1 TABLET(25 MCG) BY MOUTH DAILY BEFORE BREAKFAST 90 tablet 3   loperamide (IMODIUM) 2 MG capsule Take by mouth as needed.     MAGNESIUM PO Take 250  mg by mouth daily.     Zoledronic Acid (ZOMETA IV) Inject into the vein. Every 3 months     No current facility-administered medications for this visit.    REVIEW OF SYSTEMS:   Constitutional: ( - ) fevers, ( - )  chills , ( - ) night sweats Eyes: ( - ) blurriness of vision, ( - ) double vision, ( - ) watery eyes Ears, nose, mouth, throat, and face: ( - ) mucositis, ( - ) sore throat Respiratory: ( - ) cough, ( - ) dyspnea, ( - ) wheezes Cardiovascular: ( - ) palpitation, ( - ) chest discomfort, ( - ) lower extremity swelling Gastrointestinal:  ( - ) nausea, ( - ) heartburn, ( - ) change in bowel habits Skin: ( - ) abnormal skin rashes Lymphatics: ( - ) new lymphadenopathy, ( - ) easy bruising Neurological: ( +) numbness, ( - ) tingling, ( - ) new weaknesses Behavioral/Psych: ( - ) mood change, ( - ) new changes  All other systems were reviewed with the patient and are negative.  PHYSICAL EXAMINATION: ECOG PERFORMANCE STATUS: 0 - Asymptomatic  Vitals:   07/11/22 1011  BP: (!) 105/58  Pulse: 63  Resp: 15  Temp: 99.1 F (37.3 C)  SpO2: 100%     Filed Weights   07/11/22 1011  Weight: 137 lb 12.8 oz (62.5 kg)     GENERAL: well appearing elderly Caucasian female in NAD  SKIN: skin color, texture, turgor are normal, no rashes or significant lesions EYES:  conjunctiva are pink and non-injected, sclera clear LUNGS: clear to auscultation and percussion with normal breathing effort HEART: regular rate & rhythm and no murmurs and trace lower extremity edema Musculoskeletal: no cyanosis of digits and no clubbing  PSYCH: alert & oriented x 3, fluent speech NEURO: no focal motor/sensory deficits  LABORATORY DATA:  I have reviewed the data as listed    Latest Ref Rng & Units 07/11/2022    9:50 AM 06/12/2022   10:31 AM 05/16/2022    2:09 PM  CBC  WBC 4.0 - 10.5 K/uL 4.8  4.5  4.1   Hemoglobin 12.0 - 15.0 g/dL 12.0  12.6  12.6   Hematocrit 36.0 - 46.0 % 36.4  38.4  38.4   Platelets 150 - 400 K/uL 243  223  239        Latest Ref Rng & Units 07/11/2022    9:50 AM 06/12/2022   10:31 AM 05/16/2022    2:09 PM  CMP  Glucose 70 - 99 mg/dL 107  89  92   BUN 8 - 23 mg/dL '11  13  13   ' Creatinine 0.44 - 1.00 mg/dL 0.72  0.81  0.78   Sodium 135 - 145 mmol/L 139  135  138   Potassium 3.5 - 5.1 mmol/L 4.0  3.7  4.1   Chloride 98 - 111 mmol/L 111  104  106   CO2 22 - 32 mmol/L '24  26  28   ' Calcium 8.9 - 10.3 mg/dL 9.4  9.4  9.8   Total Protein 6.5 - 8.1 g/dL 6.3  7.0  6.8   Total Bilirubin 0.3 - 1.2 mg/dL 0.6  0.7  0.5   Alkaline Phos 38 - 126 U/L 53  46  52   AST 15 - 41 U/L '26  28  23   ' ALT 0 - 44 U/L '29  30  26     ' Lab Results  Component Value  Date   MPROTEIN Not Observed 06/12/2022   MPROTEIN Not Observed 05/19/2022   MPROTEIN Not Observed 04/15/2022   Lab Results  Component Value Date   KPAFRELGTCHN 25.7 (H) 06/12/2022   KPAFRELGTCHN 29.4 (H) 05/19/2022   KPAFRELGTCHN 27.9 (H) 04/15/2022   LAMBDASER 39.7 (H) 06/12/2022   LAMBDASER 40.2 (H) 05/19/2022   LAMBDASER 41.0 (H) 04/15/2022   KAPLAMBRATIO 8.50 06/12/2022   KAPLAMBRATIO 0.65 06/12/2022   KAPLAMBRATIO 0.73 05/19/2022    RADIOGRAPHIC STUDIES: No results found.  ASSESSMENT & PLAN Dazha Kempa 76 y.o. female with medical history significant for lambda light chain multiple  myeloma who presents for a follow up visit.   After review the labs, review the records, schedule the patient the findings most consistent with a lambda light chain multiple myeloma.  This is getting confirmed with a bone marrow biopsy showing an abnormal plasma cell population of 28% and M protein/Bence-Jones proteins in the urine.  Given these findings the patient now carries a diagnosis of lambda light chain multiple myeloma.  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  This consists of bortezomib 1.3 mg per metered squared q week, lenalidomide 25 mg p.o. x14 days of 21-day cycle, and dexamethasone 40 mg p.o. q. weekly.  This will be continued until he reached a VGPR/ complete 8 cycles of triplet therapy, at which time we can consider transition over to maintenance Revlimid 10 mg p.o. daily 21 of 28 days per cycle.  Ms. Monfort is not a candidate for bone marrow transplant based on her advanced age.  She transition to maintenance Revlimid on 07/19/2021.  # Lambda Light Chain Multiple Myeloma--VGPR on maintenance --diagnosis confirmed with bone marrow biopsy and urine protein analysis.   --patient has good functional status and would be an excellent candidate for VRd chemotherapy. I do not believe she would be a bone marrow transplant candidate based on her age.  --assure monthly restaging labs with SPEP, UPEP, and SFLC --patient declines port placement at this time --Cycle 1 Day 1 of therapy started on 02/01/2021.  --she completed Cycle 8 Day 1 of Vrd, started maintenance Revlimid on 07/19/2021. --Labs from today were reviewed and require no intervention. SPEP and sFLC pending.  Plan: -- Continue on maintenance revlimid 53m PO daily  --Monthly restaging labs with serum free light chains, UPEP, and SPEP. Last SPEP from 06/12/2022 showed undetectable M protein and SFLC show a normal ratio --Labs today show white blood cell count 4.8, hemoglobin 12.0, MCV 87.7, and  platelets of 243.  Creatinine 0.72 --Return to clinic every 4 weeks  #Back Pain -- Currently well controlled on gabapentin and Tylenol --CT thoracic and lumbar spine showed no lytic lesions or sequelae of multiple myeloma --MRI thoracic and lumbar spine from 07/07/2022 for baseline imaging. Findings showed multiple bone lesions involving posterior T9 and T8, lumbar spine and upper sacrum consistent with multiple myeloma. No extension into the canal. No pathologic fracture.  --Patient plan to follow up with Dr .GKary Kos neurosugery --Continue to monitor  #Supportive Care --chemotherapy education complete  --zofran [id number] PRN and compazine 1011mO q6H for nausea --acyclovir can be d/c --zometa therapy performed on 06/12/2022. Repeat q 3 months, due in October 2023.  -- no pain medication required at this time.   No orders of the defined types were placed in this encounter.  All questions were answered. The patient knows to call the clinic with any problems, questions or concerns.  I have  spent a total of 30 minutes minutes of face-to-face and non-face-to-face time, preparing to see the patient, performing a medically appropriate examination, counseling and educating the patient, documenting clinical information in the electronic health record.   Lincoln Brigham, PA-C Department of Hematology/Oncology [place] at [facility] Phone: [phone]  07/11/2022 11:31 AM   Literature Support:  1) Bennett Scrape Bortezomib, lenalidomide, and dexamethasone in transplant-eligible newly diagnosed multiple myeloma patients: a multicenter retrospective comparative analysis. Int Hinton Dyer. 2020 Jan;111(1):103-111. doi: 10.1007/s12185-019-02764-1.  -- Overall response, very good partial response, and complete response rates after VRD were 96.4%, 45.5%, and 20.0%,  respectively (median follow-up period, 17.7 months). The 1-year progression-free survival (PFS) and overall survival rates were 95.8% and 98.2%, respectively. The response rate and PFS were similar between the groups, regardless of cytogenetic risk and age.  2) Casandra Doffing, [address] FE, Pawlyn Loletha Grayer, [place], [place], [place], [place] A, Jones JR, Kishore B, Garg M, Williams CD, Karunanithi Delaine Lame MW, Laban Emperor NH, [place], Drayson MT, Robinette Haines WM, Myra Rude; [place] [facility] [place]. Lenalidomide maintenance versus observation for patients with newly diagnosed multiple myeloma (Myeloma XI): a multicentre, open-label, randomised, phase 3 trial. Lancet Oncol. 2019 Jan;20(1):57-73. doi: 10.1016/S1470-2045(18)30687-9. Epub 2018 Dec 14.   --Maintenance therapy with lenalidomide significantly improved progression-free survival in patients with newly diagnosed multiple myeloma compared with observation   --After a median follow-up of 31 months (IQR 18-50), median progression-free survival was 39 months (95% CI 36-42) with lenalidomide and 20 months (18-22) with observation (hazard ratio [HR] 046 [95% CI 041-053]; p<00001), and 3-year overall survival was 786% (95% Cl 756-816) in the lenalidomide group and 758% (742-552) in the observation group (HR 087 [95% CI 073-105]; p=015). Progression-free survival was improved with lenalidomide compared with observation across all prespecified subgroups.

## 2022-07-12 LAB — BETA 2 MICROGLOBULIN, SERUM: Beta-2 Microglobulin: 1.7 mg/L (ref 0.6–2.4)

## 2022-07-14 ENCOUNTER — Telehealth: Payer: Self-pay | Admitting: Hematology and Oncology

## 2022-07-14 LAB — KAPPA/LAMBDA LIGHT CHAINS
Kappa free light chain: 25.2 mg/L — ABNORMAL HIGH (ref 3.3–19.4)
Kappa, lambda light chain ratio: 0.54 (ref 0.26–1.65)
Lambda free light chains: 46.6 mg/L — ABNORMAL HIGH (ref 5.7–26.3)

## 2022-07-14 NOTE — Telephone Encounter (Signed)
Per 8/18 los called and left message for pt about appointment

## 2022-07-16 LAB — MULTIPLE MYELOMA PANEL, SERUM
Albumin SerPl Elph-Mcnc: 3.8 g/dL (ref 2.9–4.4)
Albumin/Glob SerPl: 1.6 (ref 0.7–1.7)
Alpha 1: 0.2 g/dL (ref 0.0–0.4)
Alpha2 Glob SerPl Elph-Mcnc: 0.7 g/dL (ref 0.4–1.0)
B-Globulin SerPl Elph-Mcnc: 0.8 g/dL (ref 0.7–1.3)
Gamma Glob SerPl Elph-Mcnc: 0.8 g/dL (ref 0.4–1.8)
Globulin, Total: 2.5 g/dL (ref 2.2–3.9)
IgA: 180 mg/dL (ref 64–422)
IgG (Immunoglobin G), Serum: 789 mg/dL (ref 586–1602)
IgM (Immunoglobulin M), Srm: 19 mg/dL — ABNORMAL LOW (ref 26–217)
Total Protein ELP: 6.3 g/dL (ref 6.0–8.5)

## 2022-07-17 DIAGNOSIS — C9001 Multiple myeloma in remission: Secondary | ICD-10-CM | POA: Diagnosis not present

## 2022-07-23 ENCOUNTER — Other Ambulatory Visit: Payer: Self-pay | Admitting: *Deleted

## 2022-07-23 MED ORDER — LENALIDOMIDE 10 MG PO CAPS: ORAL_CAPSULE | ORAL | 0 refills | Status: DC

## 2022-08-08 ENCOUNTER — Inpatient Hospital Stay: Payer: Medicare Other | Attending: Physician Assistant

## 2022-08-08 ENCOUNTER — Inpatient Hospital Stay (HOSPITAL_BASED_OUTPATIENT_CLINIC_OR_DEPARTMENT_OTHER): Payer: Medicare Other | Admitting: Hematology and Oncology

## 2022-08-08 ENCOUNTER — Other Ambulatory Visit: Payer: Self-pay

## 2022-08-08 VITALS — BP 130/71 | HR 61 | Temp 98.1°F | Resp 15 | Wt 134.3 lb

## 2022-08-08 DIAGNOSIS — C9001 Multiple myeloma in remission: Secondary | ICD-10-CM

## 2022-08-08 DIAGNOSIS — C9000 Multiple myeloma not having achieved remission: Secondary | ICD-10-CM | POA: Insufficient documentation

## 2022-08-08 LAB — CBC WITH DIFFERENTIAL (CANCER CENTER ONLY)
Abs Immature Granulocytes: 0.01 10*3/uL (ref 0.00–0.07)
Basophils Absolute: 0.1 10*3/uL (ref 0.0–0.1)
Basophils Relative: 2 %
Eosinophils Absolute: 0.4 10*3/uL (ref 0.0–0.5)
Eosinophils Relative: 9 %
HCT: 39 % (ref 36.0–46.0)
Hemoglobin: 12.6 g/dL (ref 12.0–15.0)
Immature Granulocytes: 0 %
Lymphocytes Relative: 31 %
Lymphs Abs: 1.4 10*3/uL (ref 0.7–4.0)
MCH: 29 pg (ref 26.0–34.0)
MCHC: 32.3 g/dL (ref 30.0–36.0)
MCV: 89.7 fL (ref 80.0–100.0)
Monocytes Absolute: 0.4 10*3/uL (ref 0.1–1.0)
Monocytes Relative: 10 %
Neutro Abs: 2.2 10*3/uL (ref 1.7–7.7)
Neutrophils Relative %: 48 %
Platelet Count: 233 10*3/uL (ref 150–400)
RBC: 4.35 MIL/uL (ref 3.87–5.11)
RDW: 15.1 % (ref 11.5–15.5)
WBC Count: 4.5 10*3/uL (ref 4.0–10.5)
nRBC: 0 % (ref 0.0–0.2)

## 2022-08-08 LAB — CMP (CANCER CENTER ONLY)
ALT: 33 U/L (ref 0–44)
AST: 29 U/L (ref 15–41)
Albumin: 4.6 g/dL (ref 3.5–5.0)
Alkaline Phosphatase: 49 U/L (ref 38–126)
Anion gap: 6 (ref 5–15)
BUN: 16 mg/dL (ref 8–23)
CO2: 27 mmol/L (ref 22–32)
Calcium: 9.8 mg/dL (ref 8.9–10.3)
Chloride: 106 mmol/L (ref 98–111)
Creatinine: 0.83 mg/dL (ref 0.44–1.00)
GFR, Estimated: >60 mL/min (ref >60)
Glucose, Bld: 87 mg/dL (ref 70–99)
Potassium: 3.9 mmol/L (ref 3.5–5.1)
Sodium: 139 mmol/L (ref 135–145)
Total Bilirubin: 0.8 mg/dL (ref 0.3–1.2)
Total Protein: 7.1 g/dL (ref 6.5–8.1)

## 2022-08-08 LAB — LACTATE DEHYDROGENASE: LDH: 140 U/L (ref 98–192)

## 2022-08-09 LAB — BETA 2 MICROGLOBULIN, SERUM: Beta-2 Microglobulin: 1.6 mg/L (ref 0.6–2.4)

## 2022-08-11 LAB — KAPPA/LAMBDA LIGHT CHAINS
Kappa free light chain: 26.6 mg/L — ABNORMAL HIGH (ref 3.3–19.4)
Kappa, lambda light chain ratio: 0.52 (ref 0.26–1.65)
Lambda free light chains: 51 mg/L — ABNORMAL HIGH (ref 5.7–26.3)

## 2022-08-14 LAB — MULTIPLE MYELOMA PANEL, SERUM
Albumin SerPl Elph-Mcnc: 4 g/dL (ref 2.9–4.4)
Albumin/Glob SerPl: 1.7 (ref 0.7–1.7)
Alpha 1: 0.2 g/dL (ref 0.0–0.4)
Alpha2 Glob SerPl Elph-Mcnc: 0.6 g/dL (ref 0.4–1.0)
B-Globulin SerPl Elph-Mcnc: 0.8 g/dL (ref 0.7–1.3)
Gamma Glob SerPl Elph-Mcnc: 0.8 g/dL (ref 0.4–1.8)
Globulin, Total: 2.4 g/dL (ref 2.2–3.9)
IgA: 187 mg/dL (ref 64–422)
IgG (Immunoglobin G), Serum: 813 mg/dL (ref 586–1602)
IgM (Immunoglobulin M), Srm: 23 mg/dL — ABNORMAL LOW (ref 26–217)
Total Protein ELP: 6.4 g/dL (ref 6.0–8.5)

## 2022-08-20 ENCOUNTER — Encounter: Payer: Self-pay | Admitting: Hematology and Oncology

## 2022-08-20 ENCOUNTER — Other Ambulatory Visit: Payer: Self-pay | Admitting: *Deleted

## 2022-08-20 MED ORDER — LENALIDOMIDE 10 MG PO CAPS: ORAL_CAPSULE | ORAL | 0 refills | Status: DC

## 2022-08-20 NOTE — Progress Notes (Signed)
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 Concerning for multiple myeloma vs metastatic disease 12/31/2020: establish care with Dr. Lorenso Courier. UPEP showed marked Bence Jones protein, findings concerning for multiple myleoma 01/21/2021: bone marrow biopsy confirms multiple myeloma with atypical plasma cells representing 28% of all cells in the aspirate  02/01/2021: Cycle 1 Day 1 of VRd chemotherapy 02/22/2021: Cycle 2 Day 1 of VRd chemotherapy 03/15/2021: Cycle 3 Day 1 of VRd chemotherapy 04/05/2021: Cycle 4 Day 1 of VRd chemotherapy 04/26/2021: Cycle 5 Day 1 of VRd chemotherapy 05/17/2021: Cycle 6 Day 1 of VRd chemotherapy 06/07/2021:  Cycle 7 Day 1 of VRd chemotherapy 06/28/2021: Cycle 8 Day 1 of VRd chemotherapy 07/19/2021: start maintenance revlimid 38m PO daily.   Interval History:  Doris Slape89y.o. female with medical history significant for lambda light chain multiple myeloma who presents for a follow up visit. The patient's last visit was on 07/11/2022.  On exam today Doris Wilkerson reports she has done great throughout the month of August.  She [place].  She reports that she also went to a music festival but the subsequent day he was completely wiped out.  She notes that occasionally she does get some aches and pains but she is feeling quite perky today.  [place]  She is not currently having any negative side effects as result of her Revlimid treatment.  She denies easy  bruising or signs of bleeding. She denies fevers, chills, night sweats, shortness of breath, chest pain or cough. She hsa no other complaints. A full 10 point ROS is listed below.   MEDICAL HISTORY:  Past Medical History:  Diagnosis Date   Asthma    Cataract    GERD (gastroesophageal reflux disease)    Hyperlipidemia    Hypertension    Thyroid disease     SURGICAL HISTORY: Past Surgical History:  Procedure Laterality Date   Cataract Surgery     CORONARY ANGIOPLASTY WITH STENT PLACEMENT     EYE SURGERY     ORTHOPEDIC SURGERY  2012   TONSILLECTOMY  1951    SOCIAL HISTORY: Social History   Socioeconomic History   Marital status: Married    Spouse name: Not on file   Number of children: 0   Years of education: Not on file   Highest education level: Not on file  Occupational History   Occupation: unemployed  Tobacco Use   Smoking status: Never   Smokeless tobacco: Never  Vaping Use   Vaping Use: Never used  Substance and Sexual Activity   Alcohol use: Yes    Alcohol/week: 1.0 standard drink of alcohol    Types: 1 Glasses of wine per week    Comment: occassionally   Drug use: No   Sexual activity: Not Currently  Other Topics Concern   Not on file  Social History Narrative   Married. Education: college. Pt does exercise-   Social Determinants of Health   Financial Resource Strain: Low Risk  (04/03/2022)   Overall FEmergency planning/management officerStrain (  CARDIA)    Difficulty of Paying Living Expenses: Not hard at all  Food Insecurity: No Food Insecurity (04/03/2022)   Hunger Vital Sign    Worried About Running Out of Food in the Last Year: Never true    Ran Out of Food in the Last Year: Never true  Transportation Needs: No Transportation Needs (04/03/2022)   PRAPARE - Hydrologist (Medical): No    Lack of Transportation (Non-Medical): No  Physical Activity: Sufficiently Active (04/03/2022)   Exercise Vital Sign    Days of Exercise per Week: 3 days     Minutes of Exercise per Session: 50 min  Stress: No Stress Concern Present (04/03/2022)   [place]    Feeling of Stress : Not at all  Social Connections: Moderately Integrated (04/03/2022)   Social Connection and Isolation Panel [NHANES]    Frequency of Communication with Friends and Family: More than three times a week    Frequency of Social Gatherings with Friends and Family: More than three times a week    Attends Religious Services: Never    Marine scientist or Organizations: Yes    Attends Music therapist: More than 4 times per year    Marital Status: Married  Human resources officer Violence: Not At Risk (04/03/2022)   Humiliation, Afraid, Rape, and Kick questionnaire    Fear of Current or Ex-Partner: No    Emotionally Abused: No    Physically Abused: No    Sexually Abused: No    FAMILY HISTORY: Family History  Problem Relation Age of Onset   Cancer Father    Heart disease Brother    Dementia Brother    Leukemia Brother    Breast cancer Neg Hx    Colon cancer Neg Hx    Esophageal cancer Neg Hx    Pancreatic cancer Neg Hx    Rectal cancer Neg Hx    Stomach cancer Neg Hx     ALLERGIES:  is allergic to poison ivy extract, plavix [clopidogrel bisulfate], and other.  MEDICATIONS:  Current Outpatient Medications  Medication Sig Dispense Refill   Magnesium 100 MG TABS Take 100 mg by mouth daily.     acetaminophen (TYLENOL 8 HOUR ARTHRITIS PAIN) 650 MG CR tablet Take 1 tablet (650 mg total) by mouth every 8 (eight) hours as needed for pain.     acetaminophen (TYLENOL) 500 MG tablet Take 500 mg by mouth every 6 (six) hours as needed for mild pain. Take 3 times daily     aspirin 81 MG tablet Take 81 mg by mouth daily.      atorvastatin (LIPITOR) 80 MG tablet TAKE 1 TABLET(80 MG) BY MOUTH DAILY AT 6 PM 90 tablet 1   benazepril (LOTENSIN) 10 MG tablet TAKE 1 TABLET(10 MG) BY MOUTH DAILY 90 tablet  1   calcium carbonate (TUMS - DOSED IN MG ELEMENTAL CALCIUM) 500 MG chewable tablet Chew 1 tablet by mouth daily. Daily PRN     cholecalciferol (VITAMIN D3) 25 MCG (1000 UNIT) tablet Take 2,000 Units by mouth daily.     famotidine-calcium carbonate-magnesium hydroxide (PEPCID COMPLETE) 10-800-165 MG chewable tablet Chew 1 tablet by mouth as needed.     gabapentin (NEURONTIN) 100 MG capsule Take 1 capsule (100 mg total) by mouth 2 (two) times daily as needed. 180 capsule 1   lenalidomide (REVLIMID) 10 MG capsule TAKE 1 CAPSULE BY MOUTH DAILY  FOR 21 DAYS, THEN  7 DAYS OFF [employer] Josem Kaufmann #[id number] Date Obtained: 07/23/22 21 capsule 0   levothyroxine (SYNTHROID) 25 MCG tablet TAKE 1 TABLET(25 MCG) BY MOUTH DAILY BEFORE BREAKFAST 90 tablet 3   loperamide (IMODIUM) 2 MG capsule Take by mouth as needed.     Zoledronic Acid (ZOMETA IV) Inject into the vein. Every 3 months     No current facility-administered medications for this visit.    REVIEW OF SYSTEMS:   Constitutional: ( - ) fevers, ( - )  chills , ( - ) night sweats Eyes: ( - ) blurriness of vision, ( - ) double vision, ( - ) watery eyes Ears, nose, mouth, throat, and face: ( - ) mucositis, ( - ) sore throat Respiratory: ( - ) cough, ( - ) dyspnea, ( - ) wheezes Cardiovascular: ( - ) palpitation, ( - ) chest discomfort, ( - ) lower extremity swelling Gastrointestinal:  ( - ) nausea, ( - ) heartburn, ( - ) change in bowel habits Skin: ( - ) abnormal skin rashes Lymphatics: ( - ) new lymphadenopathy, ( - ) easy bruising Neurological: ( +) numbness, ( - ) tingling, ( - ) new weaknesses Behavioral/Psych: ( - ) mood change, ( - ) new changes  All other systems were reviewed with the patient and are negative.  PHYSICAL EXAMINATION: ECOG PERFORMANCE STATUS: 0 - Asymptomatic  Vitals:   08/08/22 1119  BP: 130/71  Pulse: 61  Resp: 15  Temp: 98.1 F (36.7 C)  SpO2: 100%     Filed Weights   08/08/22 1119  Weight: 134 lb 4.8 oz (60.9 kg)      GENERAL: well appearing elderly Caucasian female in NAD  SKIN: skin color, texture, turgor are normal, no rashes or significant lesions EYES: conjunctiva are pink and non-injected, sclera clear LUNGS: clear to auscultation and percussion with normal breathing effort HEART: regular rate & rhythm and no murmurs and trace lower extremity edema Musculoskeletal: no cyanosis of digits and no clubbing  PSYCH: alert & oriented x 3, fluent speech NEURO: no focal motor/sensory deficits  LABORATORY DATA:  I have reviewed the data as listed    Latest Ref Rng & Units 08/08/2022   10:21 AM 07/11/2022    9:50 AM 06/12/2022   10:31 AM  CBC  WBC 4.0 - 10.5 K/uL 4.5  4.8  4.5   Hemoglobin 12.0 - 15.0 g/dL 12.6  12.0  12.6   Hematocrit 36.0 - 46.0 % 39.0  36.4  38.4   Platelets 150 - 400 K/uL 233  243  223        Latest Ref Rng & Units 08/08/2022   10:21 AM 07/11/2022    9:50 AM 06/12/2022   10:31 AM  CMP  Glucose 70 - 99 mg/dL 87  107  89   BUN 8 - 23 mg/dL _0 Creatinine 0.44 - 1.00 mg/dL 0.83  0.72  0.81   Sodium 135 - 145 mmol/L 139  139  135   Potassium 3.5 - 5.1 mmol/L 3.9  4.0  3.7   Chloride 98 - 111 mmol/L 106  111  104   CO2 22 - 32 mmol/L _1 Calcium 8.9 - 10.3 mg/dL 9.8  9.4  9.4   Total Protein 6.5 - 8.1 g/dL 7.1  6.3  7.0   Total Bilirubin 0.3 - 1.2 mg/dL 0.8  0.6  0.7   Alkaline Phos 38 - 126 U/L 49  53  46   AST 15 - 41 U/L _0 ALT 0 - 44 U/L 33  29  30     Lab Results  Component Value Date   MPROTEIN Not Observed 08/08/2022   MPROTEIN Not Observed 07/11/2022   MPROTEIN Not Observed 06/12/2022   Lab Results  Component Value Date   KPAFRELGTCHN 26.6 (H) 08/08/2022   KPAFRELGTCHN 25.2 (H) 07/11/2022   KPAFRELGTCHN 25.7 (H) 06/12/2022   LAMBDASER 51.0 (H) 08/08/2022   LAMBDASER 46.6 (H) 07/11/2022   LAMBDASER 39.7 (H) 06/12/2022   KAPLAMBRATIO 0.52 08/08/2022   KAPLAMBRATIO 0.54 07/11/2022   KAPLAMBRATIO 8.50 06/12/2022     RADIOGRAPHIC STUDIES: No results found.  ASSESSMENT & PLAN Doris Wilkerson 76 y.o. female with medical history significant for lambda light chain multiple myeloma who presents for a follow up visit.   After review the labs, review the records, schedule the patient the findings most consistent with a lambda light chain multiple myeloma.  This is getting confirmed with a bone marrow biopsy showing an abnormal plasma cell population of 28% and M protein/Bence-Jones proteins in the urine.  Given these findings the patient now carries a diagnosis of lambda light chain multiple myeloma.  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  This consists of bortezomib 1.3 mg per metered squared q week, lenalidomide 25 mg p.o. x14 days of 21-day cycle, and dexamethasone 40 mg p.o. q. weekly.  This will be continued until he reached a VGPR/ complete 8 cycles of triplet therapy, at which time we can consider transition over to maintenance Revlimid 10 mg p.o. daily 21 of 28 days per cycle.  Doris Wilkerson is not a candidate for bone marrow transplant based on her advanced age.  She transition to maintenance Revlimid on 07/19/2021.  # Lambda Light Chain Multiple Myeloma--VGPR on maintenance --diagnosis confirmed with bone marrow biopsy and urine protein analysis.   --patient has good functional status and would be an excellent candidate for VRd chemotherapy. I do not believe she would be a bone marrow transplant candidate based on her age.  --assure monthly restaging labs with SPEP, UPEP, and SFLC --patient declines port placement at this time --Cycle 1 Day 1 of therapy started on 02/01/2021.  --she completed Cycle 8 Day 1 of Vrd, started maintenance Revlimid on 07/19/2021. --Labs from today were reviewed and require no intervention. SPEP and sFLC pending.  Plan: -- Continue on maintenance revlimid 41m PO daily  --Monthly restaging labs with serum free light chains, UPEP, and SPEP.  Last SPEP from 08/08/2022 showed undetectable M protein and SFLC show a normal ratio --Labs today show white blood cell count 4.5, hemoglobin 12.6, MCV 89.7, and platelets of 233.  Creatinine 0.83 --Return to clinic every 4 weeks  #Back Pain -- Currently well controlled on gabapentin and Tylenol --CT thoracic and lumbar spine showed no lytic lesions or sequelae of multiple myeloma --MRI thoracic and lumbar spine from 07/07/2022 for baseline imaging. Findings showed multiple bone lesions involving posterior T9 and T8, lumbar spine and upper sacrum consistent with multiple myeloma. No extension into the canal. No pathologic fracture.  --Patient plan to follow up with Dr .GKary Kos neurosugery --Continue to monitor  #Supportive Care --chemotherapy education complete  --zofran [id number] PRN and compazine 1048mO q6H for nausea --acyclovir can be d/c --zometa therapy performed on 06/12/2022. Repeat q 3 months, due in October 2023.  -- no pain medication required at  this time.   No orders of the defined types were placed in this encounter.  All questions were answered. The patient knows to call the clinic with any problems, questions or concerns.  I have spent a total of 30 minutes minutes of face-to-face and non-face-to-face time, preparing to see the patient, performing a medically appropriate examination, counseling and educating the patient, documenting clinical information in the electronic health record.   Ledell Peoples, MD Department of Hematology/Oncology [place] [facility] Phone: [phone] Pager: [phone] Email: [email or web address]   08/20/2022 12:15 PM   Literature Support:  1) Bennett Scrape Bortezomib, lenalidomide, and dexamethasone in transplant-eligible newly diagnosed multiple myeloma patients: a multicenter  retrospective comparative analysis. Int Hinton Dyer. 2020 Jan;111(1):103-111. doi: 10.1007/s12185-019-02764-1.  -- Overall response, very good partial response, and complete response rates after VRD were 96.4%, 45.5%, and 20.0%, respectively (median follow-up period, 17.7 months). The 1-year progression-free survival (PFS) and overall survival rates were 95.8% and 98.2%, respectively. The response rate and PFS were similar between the groups, regardless of cytogenetic risk and age.  2) Casandra Doffing, [address] FE, Pawlyn Loletha Grayer, [place], [place], [place], [place] A, Jones JR, Kishore B, Garg M, Williams CD, Karunanithi Delaine Lame MW, Laban Emperor NH, [place], [place] MT, Robinette Haines WM, Myra Rude; [place] [facility] [place]. Lenalidomide maintenance versus observation for patients with newly diagnosed multiple myeloma (Myeloma XI): a multicentre, open-label, randomised, phase 3 trial. Lancet Oncol. 2019 Jan;20(1):57-73. doi: 10.1016/S1470-2045(18)30687-9. Epub 2018 Dec 14.   --Maintenance therapy with lenalidomide significantly improved progression-free survival in patients with newly diagnosed multiple myeloma compared with observation   --After a median follow-up of 31 months (IQR 18-50), median progression-free survival was 39 months (95% CI 36-42) with lenalidomide and 20 months (18-22) with observation (hazard ratio [HR] 046 [95% CI 041-053]; p<00001), and 3-year overall survival was 786% (95% Cl 756-816) in the lenalidomide group and 758% (256-389) in the observation group (HR 087 [95% CI 073-105]; p=015). Progression-free survival was improved with lenalidomide compared with observation across all prespecified subgroups.

## 2022-08-21 DIAGNOSIS — H26491 Other secondary cataract, right eye: Secondary | ICD-10-CM | POA: Diagnosis not present

## 2022-08-21 DIAGNOSIS — I10 Essential (primary) hypertension: Secondary | ICD-10-CM | POA: Diagnosis not present

## 2022-08-23 DIAGNOSIS — Z23 Encounter for immunization: Secondary | ICD-10-CM | POA: Diagnosis not present

## 2022-08-23 IMAGING — CT CT L SPINE W/ CM
3 series · 11 of 35 positions shown, 13 images · IV contrast (agent unspecified)
Comparison: Images from CT biopsy 01/21/2021

CLINICAL DATA: Hematologic malignancy, monitor back pain. History
of multiple myeloma currently in remission.

EXAM:
CT LUMBAR SPINE WITH CONTRAST
TECHNIQUE: Multidetector CT imaging of the lumbar spine was performed with
intravenous contrast administration.

[Series 4: l spine soft · axial · 0.36mm/px · z∈[+1106,+1282]mm · 3 of 144 slices shown, 4 images]
[im 34/144  soft-tissue]
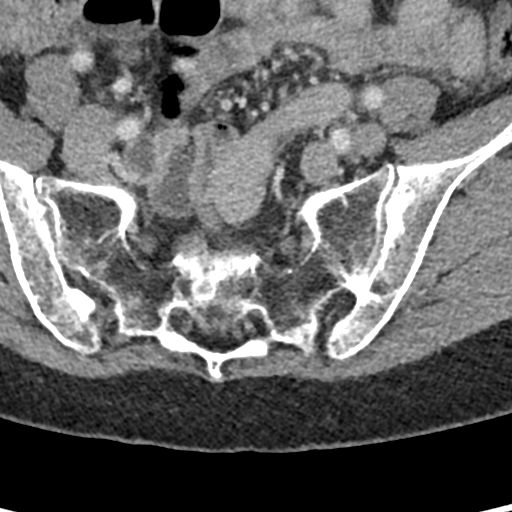
[im 34/144  bone]
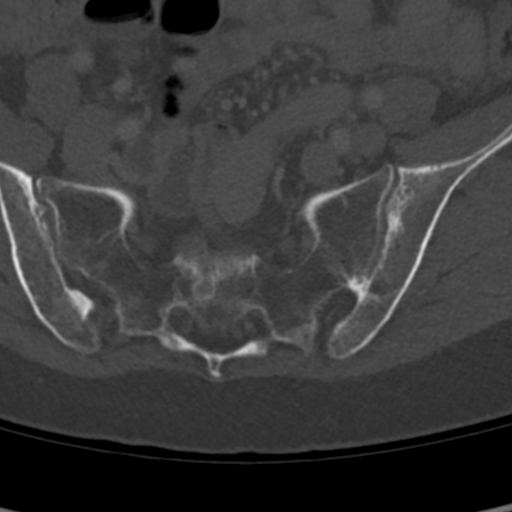
[im 78/144  bone]
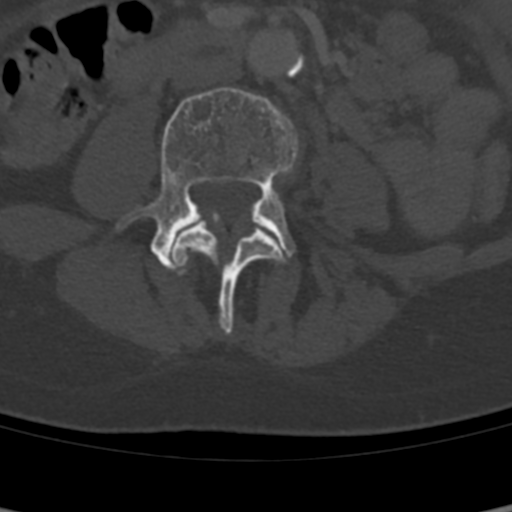
[im 122/144  bone]
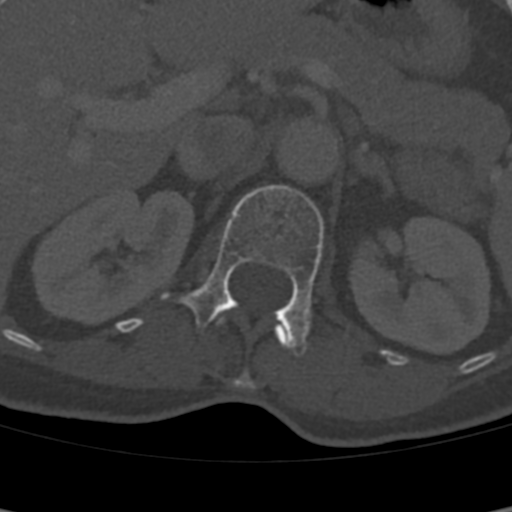

[Series 6: coronal bone · coronal · 0.34mm/px · 3 of 85 slices shown]
[im 17/85  bone]
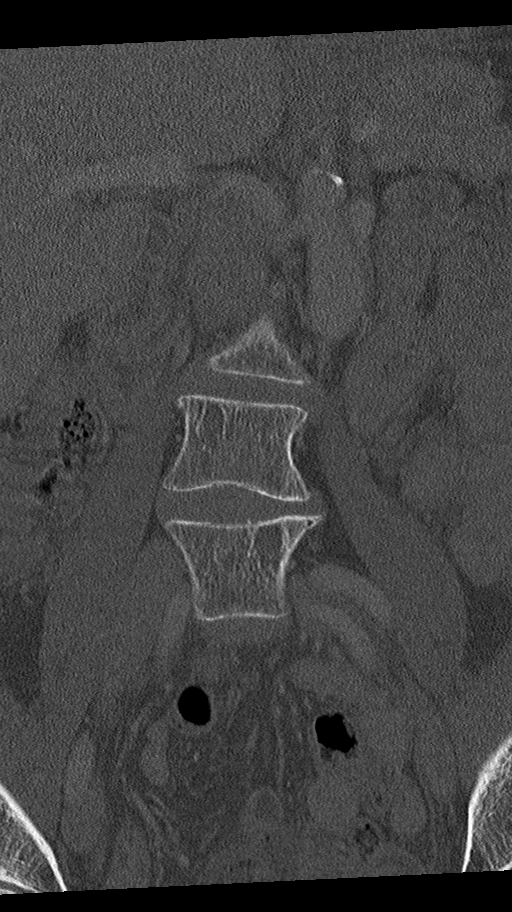
[im 34/85  bone]
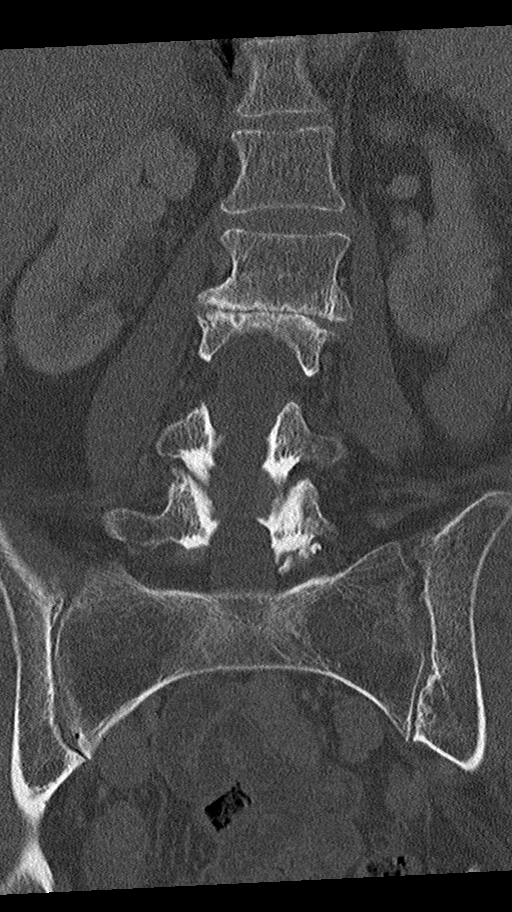
[im 51/85  bone]
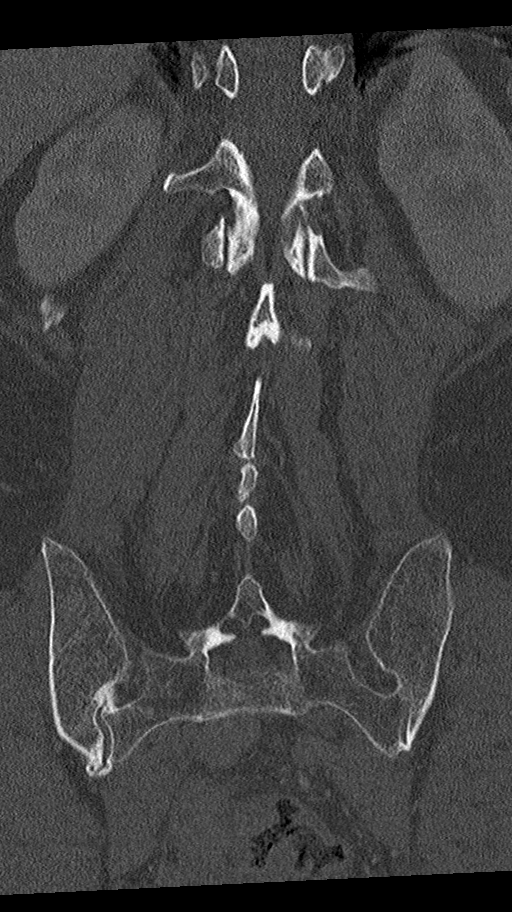

[Series 7: sagittal bone · sagittal · 0.40mm/px · 5 of 87 slices shown, 6 images]
[im 29/87  bone]
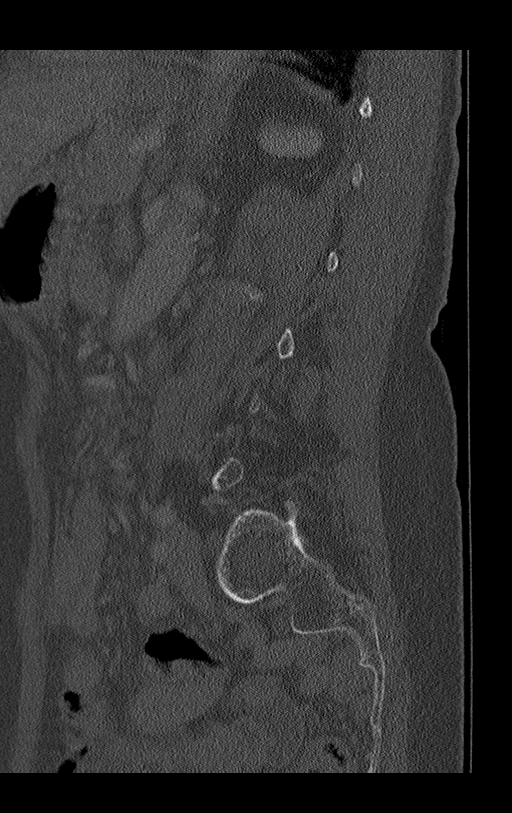
[im 36/87  bone]
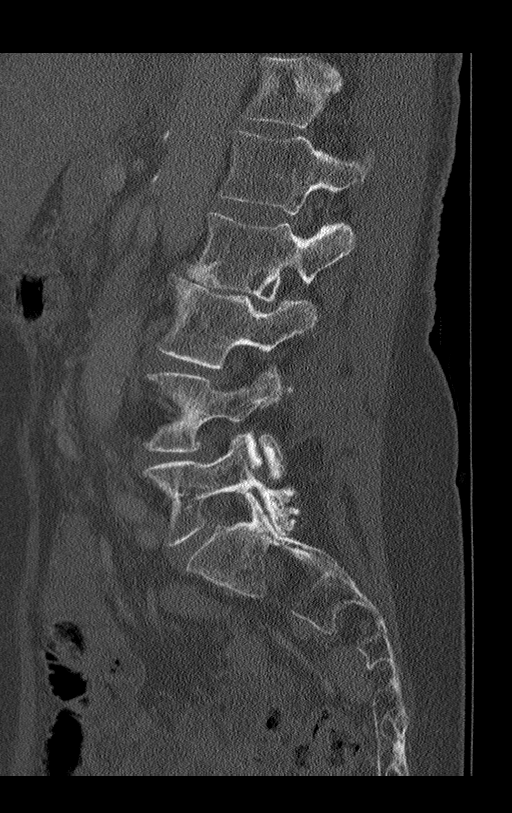
[im 44/87  soft-tissue]
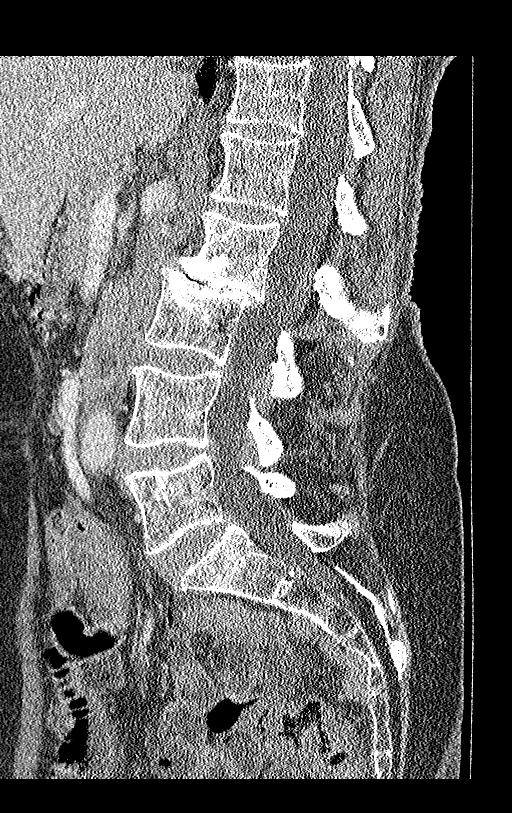
[im 44/87  bone]
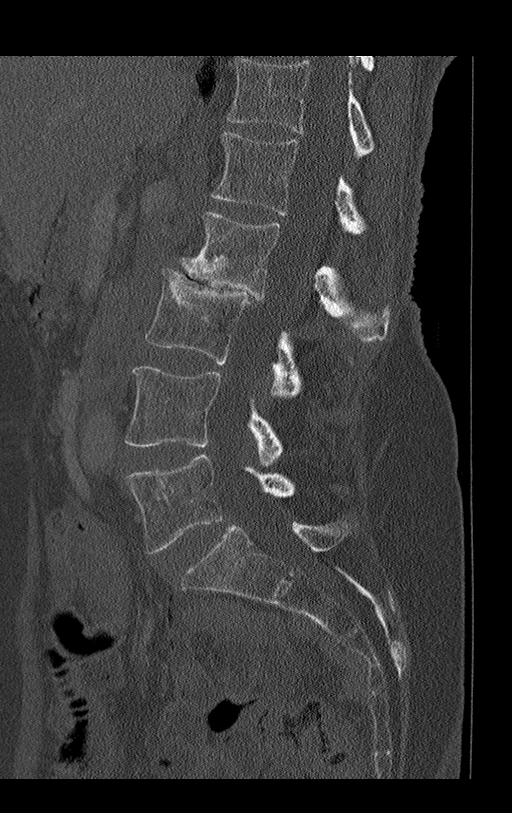
[im 51/87  bone]
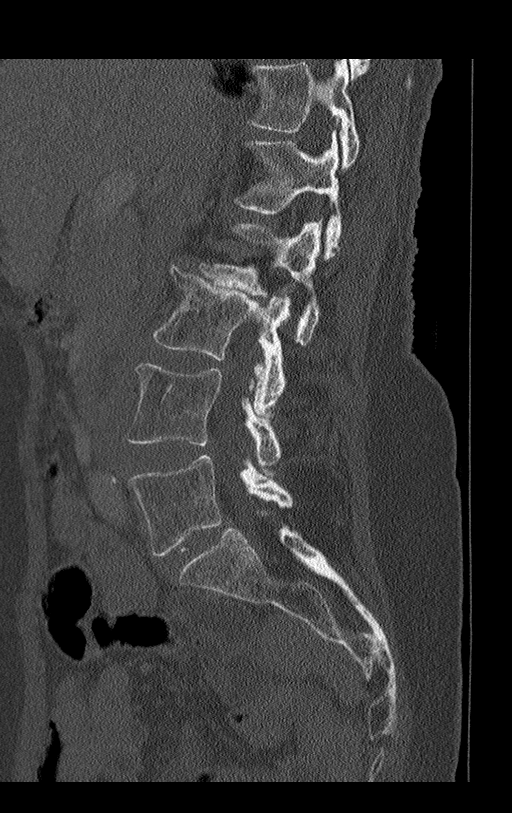
[im 58/87  bone]
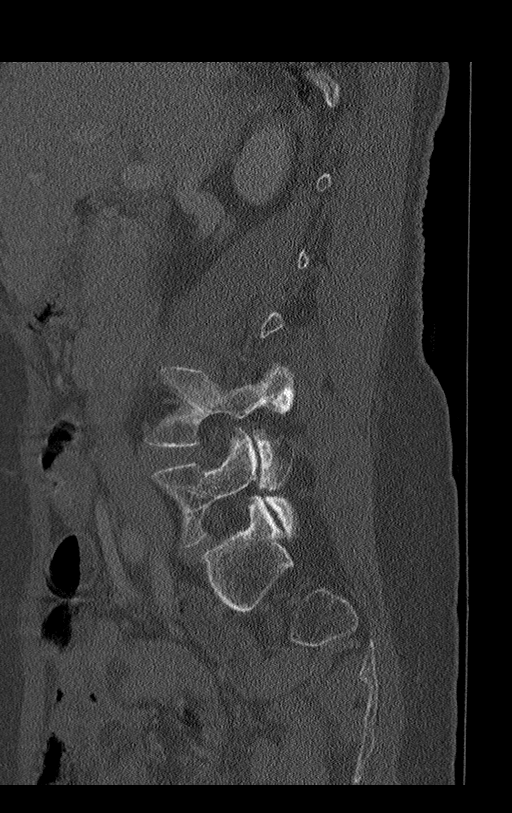

[11 of 35 positions shown; findings below may reference images not displayed]

RADIATION DOSE REDUCTION: This exam was performed according to the
departmental dose-optimization program which includes automated
exposure control, adjustment of the mA and/or kV according to
patient size and/or use of iterative reconstruction technique.

CONTRAST:  100mL OMNIPAQUE IOHEXOL 300 MG/ML  SOLN
FINDINGS: Segmentation: 5 lumbar type vertebral bodies.

Alignment: Mild scoliotic curvature. Retrolisthesis of L2 relative
to L3 measuring 6-7 mm.

Vertebrae: 6 mm lucency within the left side of the T12 vertebral
body.

No visible abnormality of the L1 vertebra.

11 mm lucency within the posteroinferior left corner of the L2
vertebral body with sclerotic margins which could relate to chronic
inactive myeloma. Degenerative type endplate changes at L2-3 related
to chronic degenerative disc disease.

No visible abnormality of the L3 vertebra otherwise.

No abnormality of the L4 vertebra.

10 mm lucency within the left side of the L5 vertebral body appears
the same as it did on the biopsy CT of 01/21/2021.

No focal abnormality seen affecting the sacrum or medial aspects the
iliac bones.

Paraspinal and other soft tissues: Negative other than ordinary
aortic atherosclerosis.

Disc levels: Degenerative disc disease at L2-3. 6-7 mm of
retrolisthesis. Loss of disc height. No compressive canal stenosis.
Mild foraminal narrowing.

The other levels are unremarkable.
IMPRESSION: 6 mm lucency within the left side of the T12 vertebral body. 11 mm
lucency within the left posteroinferior corner of the L2 vertebral
body. 10 mm lucency within the left side of the L5 vertebral body.
The L5 abnormality appears unchanged from a biopsy CT done in
Friday December, 2020. The other abnormalities were not previously imaged
that I can find, but may indicate chronic treated or quiescent
disease.

## 2022-08-23 IMAGING — CT CT T SPINE W/ CM
4 of 5 series · 11 of 33 positions shown, 12 images · IV contrast (agent unspecified)
Comparison: Lumbar study same day

CLINICAL DATA: Back pain.  Myeloma currently in remission.

EXAM:
CT THORACIC SPINE WITH CONTRAST
TECHNIQUE: Multidetector CT images of thoracic was performed according to the
standard protocol following intravenous contrast administration.

[Series 4: t spine soft · axial · 0.42mm/px · z∈[+1364,+1480]mm · 2 of 174 slices shown]
[im 58/174  soft-tissue]
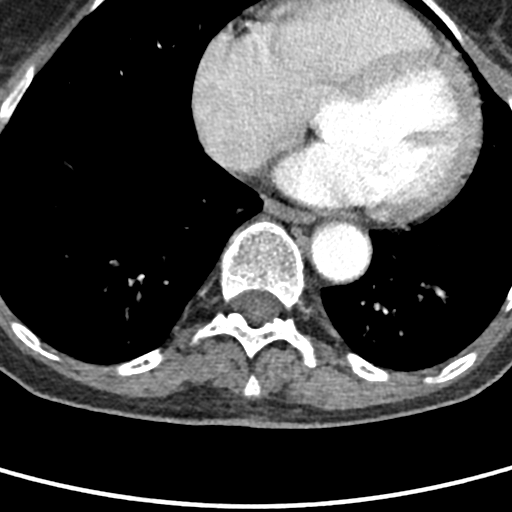
[im 116/174  soft-tissue]
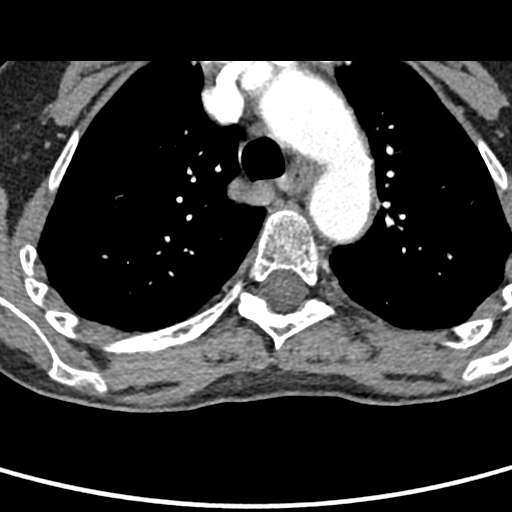

[Series 6: coronal bone · coronal · 0.28mm/px · 3 of 89 slices shown]
[im 18/89  bone]
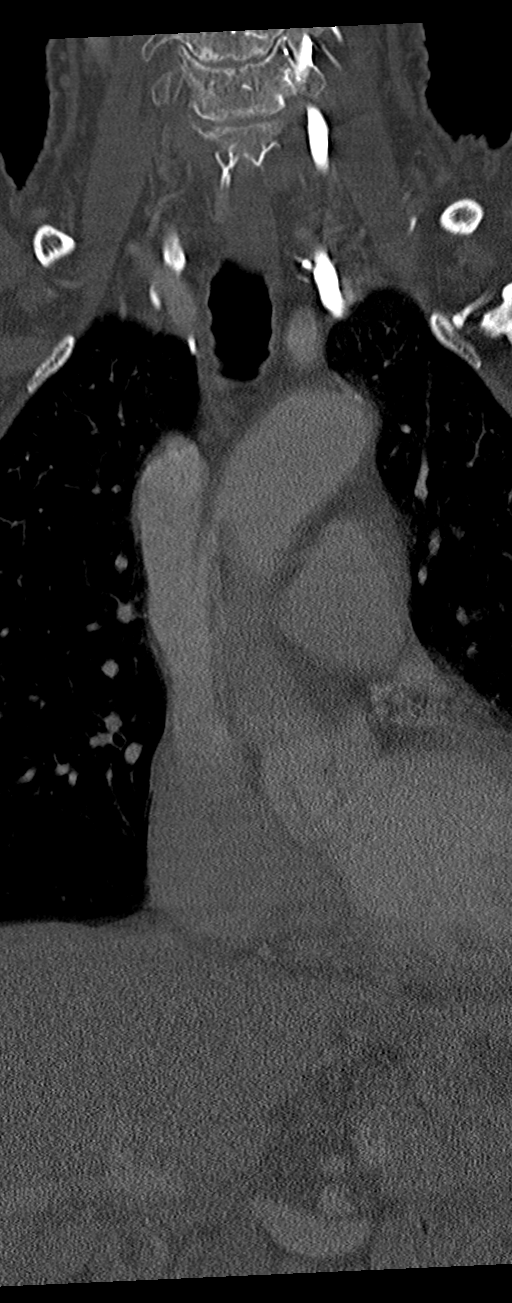
[im 36/89  bone]
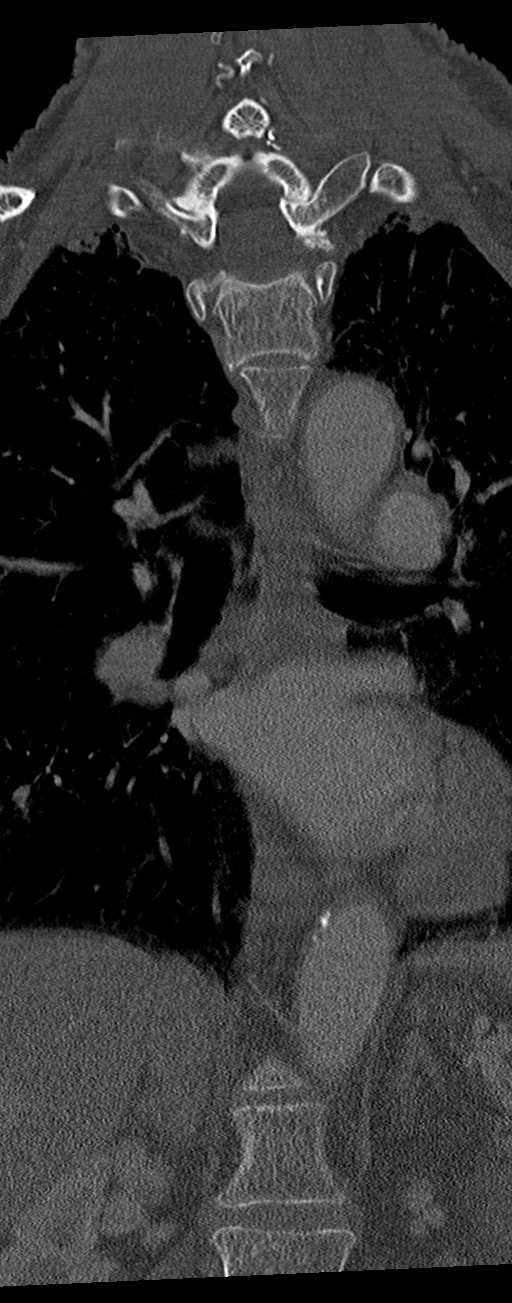
[im 53/89  bone]
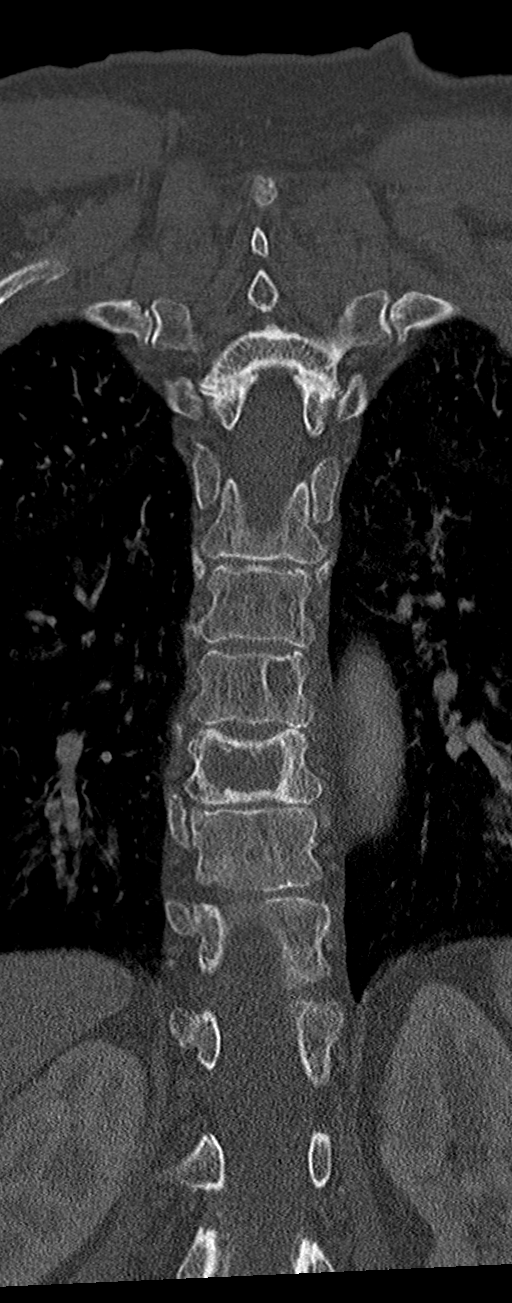

[Series 8: orthongonal axials lower · axial · 0.27mm/px · z∈[+1342,+1522]mm · 3 of 182 slices shown, 4 images]
[im 46/182  soft-tissue]
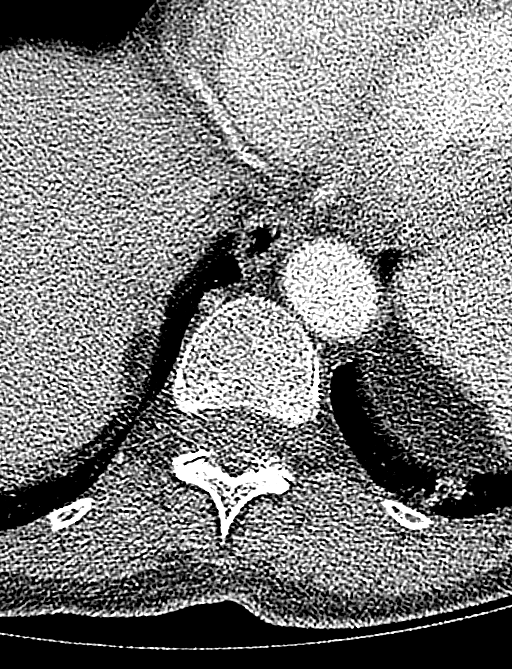
[im 46/182  bone]
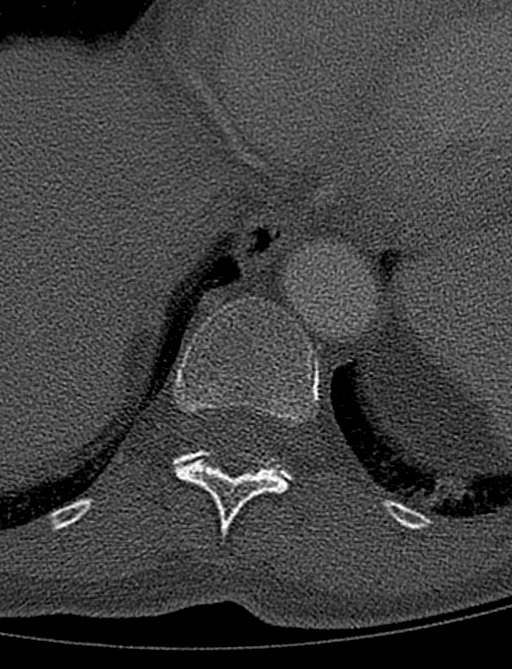
[im 91/182  bone]
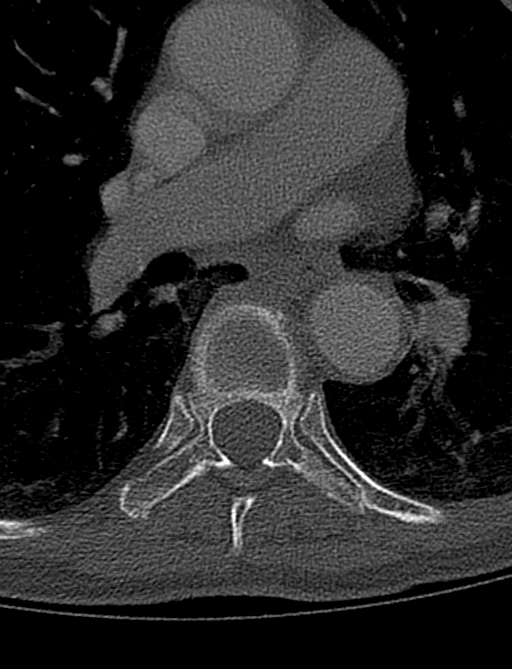
[im 136/182  bone]
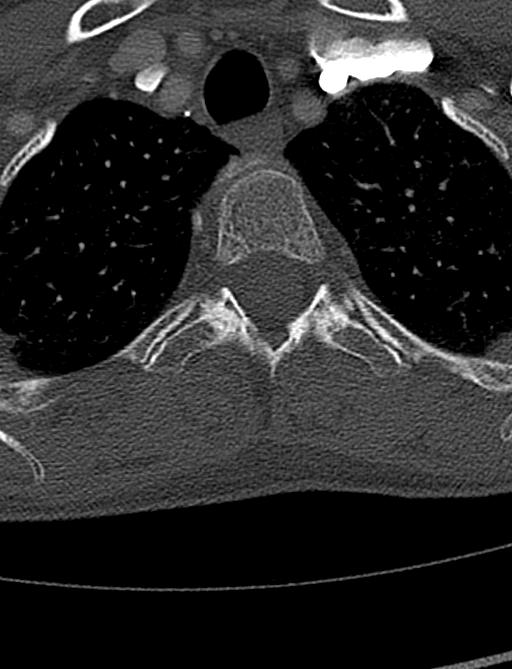

[Series 9: orthongonal axials upper · axial · 0.27mm/px · z∈[+1321,+1487]mm · 3 of 185 slices shown]
[im 47/185  bone]
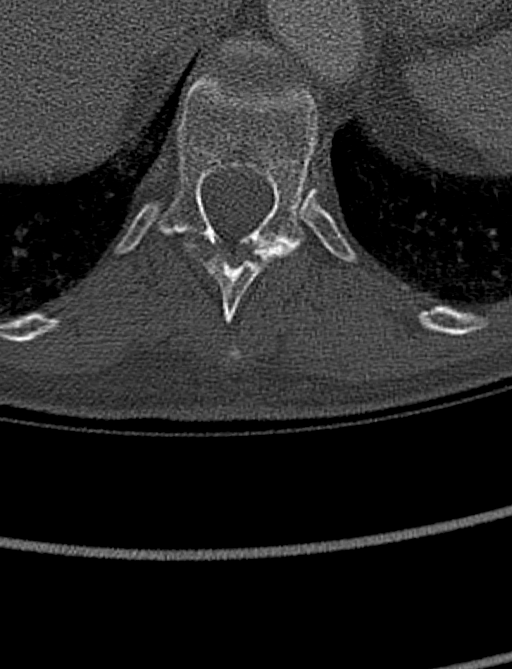
[im 93/185  bone]
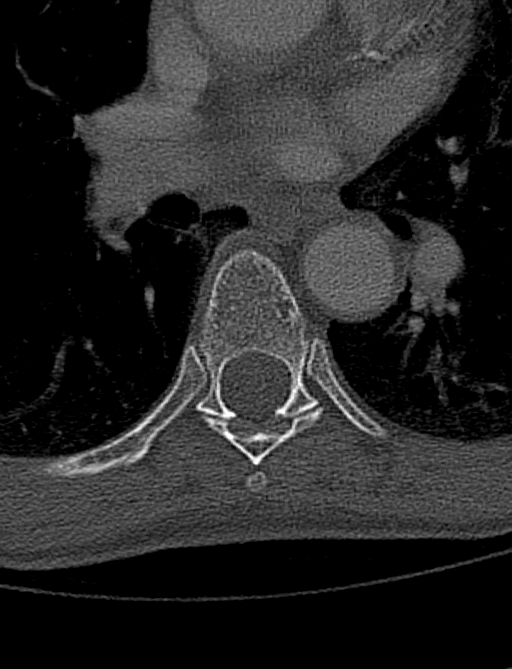
[im 139/185  bone]
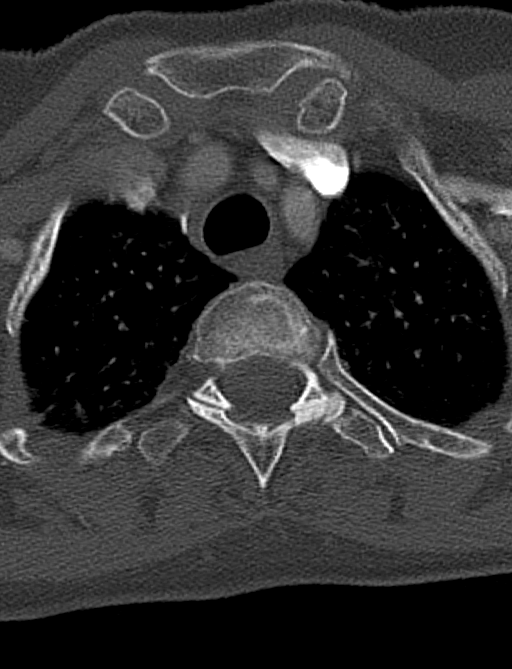

[11 of 33 positions shown; findings below may reference images not displayed]

RADIATION DOSE REDUCTION: This exam was performed according to the
departmental dose-optimization program which includes automated
exposure control, adjustment of the mA and/or kV according to
patient size and/or use of iterative reconstruction technique.

CONTRAST:  100mL OMNIPAQUE IOHEXOL 300 MG/ML  SOLN
FINDINGS: Alignment: No malalignment.

Vertebrae: No lesions seen from C6 through T7. One could question a
4 mm lytic focus within the posterior elements on the left at T4 but
this is not definite.

At T8, there is a 9 mm lucency within the left posterosuperior
corner of the vertebral body. The margins appear sclerotic and this
may be a treated and inactive lesion.

At T9, there is a treated lesion of the posterior aspect of the
vertebral body which appears to have sclerotic margins. There was a
pathologic fracture in this region previously. I do not see evidence
of any extraosseous tumor.

T10 and T11 appear negative.

6 mm lucency within the left side of the T12 vertebral body is
demonstrated, as was seen on the lumbar study.

Paraspinal and other soft tissues: In negative. Posterior ribs
appear negative. Visualized portions of the sternum are negative.
Benign appearing pleural and parenchymal scarring at the lung
apices. No active pulmonary process is visible.

Disc levels: Ordinary thoracic region facet arthritis. No evidence
of significant degenerative disc disease.
IMPRESSION: Treated disease of the posterior T9 vertebral body with sequela of
previous pathologic fracture. No sign of residual or recurrent
enhancing tumor in this location. No visible canal compromise. This
may be sequela of treated disease.

9 mm lucency within the left posterosuperior corner of the T8
vertebral body, likely sequela of treated disease.

6 mm lucency within the left side of the T12 vertebral body as was
shown on the lumbar exam.

Question 4 mm lucency within the left posterior elements at T4.

No evidence of definite active or aggressive lesion.

## 2022-09-08 ENCOUNTER — Inpatient Hospital Stay (HOSPITAL_BASED_OUTPATIENT_CLINIC_OR_DEPARTMENT_OTHER): Payer: Medicare Other | Admitting: Hematology and Oncology

## 2022-09-08 ENCOUNTER — Inpatient Hospital Stay: Payer: Medicare Other

## 2022-09-08 ENCOUNTER — Inpatient Hospital Stay: Payer: Medicare Other | Attending: Physician Assistant

## 2022-09-08 VITALS — BP 130/67 | HR 58 | Temp 98.4°F | Resp 14 | Wt 135.5 lb

## 2022-09-08 DIAGNOSIS — C9001 Multiple myeloma in remission: Secondary | ICD-10-CM | POA: Diagnosis not present

## 2022-09-08 DIAGNOSIS — Z7982 Long term (current) use of aspirin: Secondary | ICD-10-CM | POA: Insufficient documentation

## 2022-09-08 DIAGNOSIS — C9000 Multiple myeloma not having achieved remission: Secondary | ICD-10-CM | POA: Insufficient documentation

## 2022-09-08 DIAGNOSIS — E079 Disorder of thyroid, unspecified: Secondary | ICD-10-CM | POA: Diagnosis not present

## 2022-09-08 DIAGNOSIS — M899 Disorder of bone, unspecified: Secondary | ICD-10-CM | POA: Diagnosis not present

## 2022-09-08 DIAGNOSIS — I10 Essential (primary) hypertension: Secondary | ICD-10-CM | POA: Insufficient documentation

## 2022-09-08 DIAGNOSIS — Z79899 Other long term (current) drug therapy: Secondary | ICD-10-CM | POA: Diagnosis not present

## 2022-09-08 LAB — CMP (CANCER CENTER ONLY)
ALT: 28 U/L (ref 0–44)
AST: 24 U/L (ref 15–41)
Albumin: 4.3 g/dL (ref 3.5–5.0)
Alkaline Phosphatase: 57 U/L (ref 38–126)
Anion gap: 6 (ref 5–15)
BUN: 16 mg/dL (ref 8–23)
CO2: 25 mmol/L (ref 22–32)
Calcium: 9.5 mg/dL (ref 8.9–10.3)
Chloride: 107 mmol/L (ref 98–111)
Creatinine: 0.76 mg/dL (ref 0.44–1.00)
GFR, Estimated: >60 mL/min (ref >60)
Glucose, Bld: 99 mg/dL (ref 70–99)
Potassium: 4.1 mmol/L (ref 3.5–5.1)
Sodium: 138 mmol/L (ref 135–145)
Total Bilirubin: 0.8 mg/dL (ref 0.3–1.2)
Total Protein: 6.7 g/dL (ref 6.5–8.1)

## 2022-09-08 LAB — CBC WITH DIFFERENTIAL (CANCER CENTER ONLY)
Abs Immature Granulocytes: 0.01 10*3/uL (ref 0.00–0.07)
Basophils Absolute: 0.1 10*3/uL (ref 0.0–0.1)
Basophils Relative: 1 %
Eosinophils Absolute: 0.4 10*3/uL (ref 0.0–0.5)
Eosinophils Relative: 7 %
HCT: 36.5 % (ref 36.0–46.0)
Hemoglobin: 12.4 g/dL (ref 12.0–15.0)
Immature Granulocytes: 0 %
Lymphocytes Relative: 27 %
Lymphs Abs: 1.3 10*3/uL (ref 0.7–4.0)
MCH: 30 pg (ref 26.0–34.0)
MCHC: 34 g/dL (ref 30.0–36.0)
MCV: 88.2 fL (ref 80.0–100.0)
Monocytes Absolute: 0.3 10*3/uL (ref 0.1–1.0)
Monocytes Relative: 6 %
Neutro Abs: 2.9 10*3/uL (ref 1.7–7.7)
Neutrophils Relative %: 59 %
Platelet Count: 247 10*3/uL (ref 150–400)
RBC: 4.14 MIL/uL (ref 3.87–5.11)
RDW: 14.7 % (ref 11.5–15.5)
WBC Count: 5 10*3/uL (ref 4.0–10.5)
nRBC: 0 % (ref 0.0–0.2)

## 2022-09-08 LAB — LACTATE DEHYDROGENASE: LDH: 150 U/L (ref 98–192)

## 2022-09-08 MED ORDER — SODIUM CHLORIDE 0.9 % IV SOLN: INTRAVENOUS | Status: DC

## 2022-09-08 MED ORDER — ZOLEDRONIC ACID 4 MG/100ML IV SOLN
4.0000 mg | Freq: Once | INTRAVENOUS | Status: AC
  Administered 2022-09-08: 4 mg via INTRAVENOUS
  Filled 2022-09-08: qty 100

## 2022-09-08 NOTE — Patient Instructions (Signed)

## 2022-09-08 NOTE — Progress Notes (Signed)
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 Concerning for multiple myeloma vs metastatic disease 12/31/2020: establish care with Dr. Lorenso Courier. UPEP showed marked Bence Jones protein, findings concerning for multiple myleoma 01/21/2021: bone marrow biopsy confirms multiple myeloma with atypical plasma cells representing 28% of all cells in the aspirate  02/01/2021: Cycle 1 Day 1 of VRd chemotherapy 02/22/2021: Cycle 2 Day 1 of VRd chemotherapy 03/15/2021: Cycle 3 Day 1 of VRd chemotherapy 04/05/2021: Cycle 4 Day 1 of VRd chemotherapy 04/26/2021: Cycle 5 Day 1 of VRd chemotherapy 05/17/2021: Cycle 6 Day 1 of VRd chemotherapy 06/07/2021:  Cycle 7 Day 1 of VRd chemotherapy 06/28/2021: Cycle 8 Day 1 of VRd chemotherapy 07/19/2021: start maintenance revlimid 62m PO daily.   Interval History:  Doris Tuch76y.o. female with medical history significant for lambda light chain multiple myeloma who presents for a follow up visit. The patient's last visit was on 08/08/2022.  On exam today Doris Wilkerson reports she has been doing well overall in the interim since her last visit.  She reports that she has been quite busy since we last saw her.  She had laser treatment of the retina and also [place].  She spent a few days and that she did not know as was able to enjoy herself without any difficulty.  She is also been taking [place] grapes.  Overall she reports minimal side effects.  She typically takes diarrhea preventative medications around day 13 or 14 in order to prevent loose  stools which occur predictably at that time.  She notes that she has had her flu shot and will be getting her COVID booster in November.  She is not currently having any negative side effects as result of her Revlimid treatment.  She denies easy bruising or signs of bleeding. She denies fevers, chills, night sweats, shortness of breath, chest pain or cough. She hsa no other complaints. A full 10 point ROS is listed below.   MEDICAL HISTORY:  Past Medical History:  Diagnosis Date   Asthma    Cataract    GERD (gastroesophageal reflux disease)    Hyperlipidemia    Hypertension    Thyroid disease     SURGICAL HISTORY: Past Surgical History:  Procedure Laterality Date   Cataract Surgery     CORONARY ANGIOPLASTY WITH STENT PLACEMENT     EYE SURGERY     ORTHOPEDIC SURGERY  2012   TONSILLECTOMY  1951    SOCIAL HISTORY: Social History   Socioeconomic History   Marital status: Married    Spouse name: Not on file   Number of children: 0   Years of education: Not on file   Highest education level: Not on file  Occupational History   Occupation: unemployed  Tobacco Use   Smoking status: Never   Smokeless tobacco: Never  Vaping Use   Vaping Use: Never used  Substance and Sexual Activity   Alcohol use: Yes    Alcohol/week: 1.0 standard drink of alcohol    Types: 1 Glasses of wine per week    Comment: occassionally   Drug use: No  Sexual activity: Not Currently  Other Topics Concern   Not on file  Social History Narrative   Married. Education: college. Pt does exercise-   Social Determinants of Health   Financial Resource Strain: Low Risk  (04/03/2022)   Overall Financial Resource Strain (CARDIA)    Difficulty of Paying Living Expenses: Not hard at all  Food Insecurity: No Food Insecurity (04/03/2022)   Hunger Vital Sign    Worried About Running Out of Food in the Last Year: Never true    Ran Out of Food in the Last Year: Never true  Transportation Needs: No Transportation  Needs (04/03/2022)   PRAPARE - Hydrologist (Medical): No    Lack of Transportation (Non-Medical): No  Physical Activity: Sufficiently Active (04/03/2022)   Exercise Vital Sign    Days of Exercise per Week: 3 days    Minutes of Exercise per Session: 50 min  Stress: No Stress Concern Present (04/03/2022)   [place]    Feeling of Stress : Not at all  Social Connections: Moderately Integrated (04/03/2022)   Social Connection and Isolation Panel [NHANES]    Frequency of Communication with Friends and Family: More than three times a week    Frequency of Social Gatherings with Friends and Family: More than three times a week    Attends Religious Services: Never    Marine scientist or Organizations: Yes    Attends Music therapist: More than 4 times per year    Marital Status: Married  Human resources officer Violence: Not At Risk (04/03/2022)   Humiliation, Afraid, Rape, and Kick questionnaire    Fear of Current or Ex-Partner: No    Emotionally Abused: No    Physically Abused: No    Sexually Abused: No    FAMILY HISTORY: Family History  Problem Relation Age of Onset   Cancer Father    Heart disease Brother    Dementia Brother    Leukemia Brother    Breast cancer Neg Hx    Colon cancer Neg Hx    Esophageal cancer Neg Hx    Pancreatic cancer Neg Hx    Rectal cancer Neg Hx    Stomach cancer Neg Hx     ALLERGIES:  is allergic to poison ivy extract, plavix [clopidogrel bisulfate], and other.  MEDICATIONS:  Current Outpatient Medications  Medication Sig Dispense Refill   acetaminophen (TYLENOL 8 HOUR ARTHRITIS PAIN) 650 MG CR tablet Take 1 tablet (650 mg total) by mouth every 8 (eight) hours as needed for pain.     acetaminophen (TYLENOL) 500 MG tablet Take 500 mg by mouth every 6 (six) hours as needed for mild pain. Take 3 times daily     aspirin 81 MG tablet Take 81 mg by  mouth daily.      atorvastatin (LIPITOR) 80 MG tablet TAKE 1 TABLET(80 MG) BY MOUTH DAILY AT 6 PM 90 tablet 1   benazepril (LOTENSIN) 10 MG tablet TAKE 1 TABLET(10 MG) BY MOUTH DAILY 90 tablet 1   calcium carbonate (TUMS - DOSED IN MG ELEMENTAL CALCIUM) 500 MG chewable tablet Chew 1 tablet by mouth daily. Daily PRN     cholecalciferol (VITAMIN D3) 25 MCG (1000 UNIT) tablet Take 2,000 Units by mouth daily.     famotidine-calcium carbonate-magnesium hydroxide (PEPCID COMPLETE) 10-800-165 MG chewable tablet Chew 1 tablet by mouth as needed.     gabapentin (NEURONTIN) 100 MG capsule Take 1 capsule (  100 mg total) by mouth 2 (two) times daily as needed. 180 capsule 1   lenalidomide (REVLIMID) 10 MG capsule TAKE 1 CAPSULE BY MOUTH DAILY  FOR 21 DAYS, THEN 7 DAYS OFF [employer] Auth #[id number]  Date Obtained: 08/20/22 21 capsule 0   levothyroxine (SYNTHROID) 25 MCG tablet TAKE 1 TABLET(25 MCG) BY MOUTH DAILY BEFORE BREAKFAST 90 tablet 3   loperamide (IMODIUM) 2 MG capsule Take by mouth as needed.     Magnesium 100 MG TABS Take 100 mg by mouth daily.     Zoledronic Acid (ZOMETA IV) Inject into the vein. Every 3 months     No current facility-administered medications for this visit.   Facility-Administered Medications Ordered in Other Visits  Medication Dose Route Frequency Provider Last Rate Last Admin   0.9 %  sodium chloride infusion   Intravenous Continuous Orson Slick, MD   Stopped at 09/08/22 1232    REVIEW OF SYSTEMS:   Constitutional: ( - ) fevers, ( - )  chills , ( - ) night sweats Eyes: ( - ) blurriness of vision, ( - ) double vision, ( - ) watery eyes Ears, nose, mouth, throat, and face: ( - ) mucositis, ( - ) sore throat Respiratory: ( - ) cough, ( - ) dyspnea, ( - ) wheezes Cardiovascular: ( - ) palpitation, ( - ) chest discomfort, ( - ) lower extremity swelling Gastrointestinal:  ( - ) nausea, ( - ) heartburn, ( - ) change in bowel habits Skin: ( - ) abnormal skin rashes Lymphatics:  ( - ) new lymphadenopathy, ( - ) easy bruising Neurological: ( +) numbness, ( - ) tingling, ( - ) new weaknesses Behavioral/Psych: ( - ) mood change, ( - ) new changes  All other systems were reviewed with the patient and are negative.  PHYSICAL EXAMINATION: ECOG PERFORMANCE STATUS: 0 - Asymptomatic  Vitals:   09/08/22 1108  BP: 130/67  Pulse: (!) 58  Resp: 14  Temp: 98.4 F (36.9 C)  SpO2: 100%     Filed Weights   09/08/22 1108  Weight: 135 lb 8 oz (61.5 kg)     GENERAL: well appearing elderly Caucasian female in NAD  SKIN: skin color, texture, turgor are normal, no rashes or significant lesions EYES: conjunctiva are pink and non-injected, sclera clear LUNGS: clear to auscultation and percussion with normal breathing effort HEART: regular rate & rhythm and no murmurs and trace lower extremity edema Musculoskeletal: no cyanosis of digits and no clubbing  PSYCH: alert & oriented x 3, fluent speech NEURO: no focal motor/sensory deficits  LABORATORY DATA:  I have reviewed the data as listed    Latest Ref Rng & Units 09/08/2022   10:18 AM 08/08/2022   10:21 AM 07/11/2022    9:50 AM  CBC  WBC 4.0 - 10.5 K/uL 5.0  4.5  4.8   Hemoglobin 12.0 - 15.0 g/dL 12.4  12.6  12.0   Hematocrit 36.0 - 46.0 % 36.5  39.0  36.4   Platelets 150 - 400 K/uL 247  233  243        Latest Ref Rng & Units 09/08/2022   10:18 AM 08/08/2022   10:21 AM 07/11/2022    9:50 AM  CMP  Glucose 70 - 99 mg/dL 99  87  107   BUN 8 - 23 mg/dL '16  16  11   ' Creatinine 0.44 - 1.00 mg/dL 0.76  0.83  0.72   Sodium 135 - 145 mmol/L  138  139  139   Potassium 3.5 - 5.1 mmol/L 4.1  3.9  4.0   Chloride 98 - 111 mmol/L 107  106  111   CO2 22 - 32 mmol/L '25  27  24   ' Calcium 8.9 - 10.3 mg/dL 9.5  9.8  9.4   Total Protein 6.5 - 8.1 g/dL 6.7  7.1  6.3   Total Bilirubin 0.3 - 1.2 mg/dL 0.8  0.8  0.6   Alkaline Phos 38 - 126 U/L 57  49  53   AST 15 - 41 U/L '24  29  26   ' ALT 0 - 44 U/L 28  33  29     Lab Results   Component Value Date   MPROTEIN Not Observed 08/08/2022   MPROTEIN Not Observed 07/11/2022   MPROTEIN Not Observed 06/12/2022   Lab Results  Component Value Date   KPAFRELGTCHN 26.6 (H) 08/08/2022   KPAFRELGTCHN 25.2 (H) 07/11/2022   KPAFRELGTCHN 25.7 (H) 06/12/2022   LAMBDASER 51.0 (H) 08/08/2022   LAMBDASER 46.6 (H) 07/11/2022   LAMBDASER 39.7 (H) 06/12/2022   KAPLAMBRATIO 0.52 08/08/2022   KAPLAMBRATIO 0.54 07/11/2022   KAPLAMBRATIO 8.50 06/12/2022    RADIOGRAPHIC STUDIES: No results found.  ASSESSMENT & PLAN Doris Wilkerson 76 y.o. female with medical history significant for lambda light chain multiple myeloma who presents for a follow up visit.   After review the labs, review the records, schedule the patient the findings most consistent with a lambda light chain multiple myeloma.  This is getting confirmed with a bone marrow biopsy showing an abnormal plasma cell population of 28% and M protein/Bence-Jones proteins in the urine.  Given these findings the patient now carries a diagnosis of lambda light chain multiple myeloma.  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  This consists of bortezomib 1.3 mg per metered squared q week, lenalidomide 25 mg p.o. x14 days of 21-day cycle, and dexamethasone 40 mg p.o. q. weekly.  This will be continued until he reached a VGPR/ complete 8 cycles of triplet therapy, at which time we can consider transition over to maintenance Revlimid 10 mg p.o. daily 21 of 28 days per cycle.  Doris Wilkerson is not a candidate for bone marrow transplant based on her advanced age.  She transition to maintenance Revlimid on 07/19/2021.  # Lambda Light Chain Multiple Myeloma--VGPR on maintenance --diagnosis confirmed with bone marrow biopsy and urine protein analysis.   --patient has good functional status and would be an excellent candidate for VRd chemotherapy. I do not believe she would be a bone marrow transplant candidate  based on her age.  --assure monthly restaging labs with SPEP, UPEP, and SFLC --patient declines port placement at this time --Cycle 1 Day 1 of therapy started on 02/01/2021.  --she completed Cycle 8 Day 1 of Vrd, started maintenance Revlimid on 07/19/2021. --Labs from today were reviewed and require no intervention. SPEP and sFLC pending.  Plan: -- Continue on maintenance revlimid 40m PO daily  --Monthly restaging labs with serum free light chains, UPEP, and SPEP. Last SPEP from 08/08/2022 showed undetectable M protein and SFLC show a normal ratio --Labs today show white blood cell count 5.0, hemoglobin 12.4, MCV 88.2, and platelets of 247.  Creatinine 0.76 --Return to clinic every 4 weeks for labs and 12 weeks for clinic visit and zometa.   #Back Pain -- Currently well controlled on gabapentin and Tylenol --CT thoracic and lumbar spine showed no  lytic lesions or sequelae of multiple myeloma --MRI thoracic and lumbar spine from 07/07/2022 for baseline imaging. Findings showed multiple bone lesions involving posterior T9 and T8, lumbar spine and upper sacrum consistent with multiple myeloma. No extension into the canal. No pathologic fracture.  --Patient plan to follow up with Dr .Kary Kos, neurosugery --Continue to monitor  #Supportive Care --chemotherapy education complete  --zofran 81m q8H PRN and compazine 178mPO q6H for nausea --acyclovir can be d/c --zometa therapy performed on 06/12/2022. Repeat q 3 months, due in Jan 2024 -- no prescription pain medication required at this time.   No orders of the defined types were placed in this encounter.  All questions were answered. The patient knows to call the clinic with any problems, questions or concerns.  I have spent a total of 30 minutes minutes of face-to-face and non-face-to-face time, preparing to see the patient, performing a medically appropriate examination, counseling and educating the patient, documenting clinical information  in the electronic health record.   JoLedell PeoplesMD Department of Hematology/Oncology CoWallulat WeEncompass Health Reading Rehabilitation Hospitalhone: [phone]: [phone]: [email or web address]   09/08/2022 5:30 PM   Literature Support:  1) SuBennett Scrapeortezomib, lenalidomide, and dexamethasone in transplant-eligible newly diagnosed multiple myeloma patients: a multicenter retrospective comparative analysis. Int J Hinton Dyer2020 Jan;111(1):103-111. doi: 10.1007/s12185-019-02764-1.  -- Overall response, very good partial response, and complete response rates after VRD were 96.4%, 45.5%, and 20.0%, respectively (median follow-up period, 17.7 months). The 1-year progression-free survival (PFS) and overall survival rates were 95.8% and 98.2%, respectively. The response rate and PFS were similar between the groups, regardless of cytogenetic risk and age.  2) JaCasandra [id number]E, Pawlyn C,Loletha GrayerCaHanamauluStJamestown WestCoDeltonaHockaday A, Jones JR, Kishore B, Garg M, Williams CD, Karunanithi K,Delaine LameW, CoLaban EmperorH, KaPinetop Country ClubDrayson MT, OwRobinette HainesM, MoMyra RudeUKVenezuelaCRI [place]-oncology Clinical Studies Group. Lenalidomide maintenance versus observation for patients with newly diagnosed multiple myeloma (Myeloma XI): a multicentre, open-label, randomised, phase 3 trial. Lancet Oncol. 2019 Jan;20(1):57-73. doi: 10.1016/S1470-2045(18)30687-9. Epub 2018 Dec 14.   --Maintenance therapy with lenalidomide significantly improved progression-free survival in patients with newly diagnosed multiple myeloma compared with observation   --After a median follow-up of 31 months (IQR 18-50), median progression-free survival was 39 months (95% CI 36-42) with lenalidomide and 20 months (18-22) with observation (hazard ratio [HR] 046 [95% CI 041-053];  p<00001), and 3-year overall survival was 786% (95% Cl 756-816) in the lenalidomide group and 758% (7(017-510in the observation group (HR 087 [95% CI 073-105]; p=015). Progression-free survival was improved with lenalidomide compared with observation across all prespecified subgroups.

## 2022-09-09 ENCOUNTER — Telehealth: Payer: Self-pay | Admitting: Hematology and Oncology

## 2022-09-09 LAB — KAPPA/LAMBDA LIGHT CHAINS
Kappa free light chain: 27.3 mg/L — ABNORMAL HIGH (ref 3.3–19.4)
Kappa, lambda light chain ratio: 0.55 (ref 0.26–1.65)
Lambda free light chains: 49.4 mg/L — ABNORMAL HIGH (ref 5.7–26.3)

## 2022-09-09 LAB — BETA 2 MICROGLOBULIN, SERUM: Beta-2 Microglobulin: 1.7 mg/L (ref 0.6–2.4)

## 2022-09-09 NOTE — Telephone Encounter (Signed)
Per 10/16 called and spoke to pt about appointments

## 2022-09-11 ENCOUNTER — Other Ambulatory Visit: Payer: Self-pay | Admitting: *Deleted

## 2022-09-11 DIAGNOSIS — C9000 Multiple myeloma not having achieved remission: Secondary | ICD-10-CM | POA: Diagnosis not present

## 2022-09-11 DIAGNOSIS — C9001 Multiple myeloma in remission: Secondary | ICD-10-CM

## 2022-09-11 DIAGNOSIS — Z7982 Long term (current) use of aspirin: Secondary | ICD-10-CM | POA: Diagnosis not present

## 2022-09-11 DIAGNOSIS — E079 Disorder of thyroid, unspecified: Secondary | ICD-10-CM | POA: Diagnosis not present

## 2022-09-11 DIAGNOSIS — Z79899 Other long term (current) drug therapy: Secondary | ICD-10-CM | POA: Diagnosis not present

## 2022-09-11 DIAGNOSIS — I10 Essential (primary) hypertension: Secondary | ICD-10-CM | POA: Diagnosis not present

## 2022-09-11 LAB — MULTIPLE MYELOMA PANEL, SERUM
Albumin SerPl Elph-Mcnc: 3.9 g/dL (ref 2.9–4.4)
Albumin/Glob SerPl: 1.6 (ref 0.7–1.7)
Alpha 1: 0.2 g/dL (ref 0.0–0.4)
Alpha2 Glob SerPl Elph-Mcnc: 0.7 g/dL (ref 0.4–1.0)
B-Globulin SerPl Elph-Mcnc: 0.8 g/dL (ref 0.7–1.3)
Gamma Glob SerPl Elph-Mcnc: 0.8 g/dL (ref 0.4–1.8)
Globulin, Total: 2.5 g/dL (ref 2.2–3.9)
IgA: 184 mg/dL (ref 64–422)
IgG (Immunoglobin G), Serum: 807 mg/dL (ref 586–1602)
IgM (Immunoglobulin M), Srm: 25 mg/dL — ABNORMAL LOW (ref 26–217)
Total Protein ELP: 6.4 g/dL (ref 6.0–8.5)

## 2022-09-15 ENCOUNTER — Encounter: Payer: Self-pay | Admitting: Hematology and Oncology

## 2022-09-15 LAB — UPEP/UIFE/LIGHT CHAINS/TP, 24-HR UR
% BETA, Urine: 24.7 %
ALPHA 1 URINE: 6.5 %
Albumin, U: 34.9 %
Alpha 2, Urine: 14.5 %
Free Kappa Lt Chains,Ur: 52.83 mg/L (ref 1.17–86.46)
Free Kappa/Lambda Ratio: 10.96 (ref 1.83–14.26)
Free Lambda Lt Chains,Ur: 4.82 mg/L (ref 0.27–15.21)
GAMMA GLOBULIN URINE: 19.4 %
Total Protein, Urine-Ur/day: 163 mg/24 hr — ABNORMAL HIGH (ref 30–150)
Total Protein, Urine: 6.2 mg/dL
Total Volume: 2625

## 2022-09-17 ENCOUNTER — Other Ambulatory Visit: Payer: Self-pay | Admitting: *Deleted

## 2022-09-17 MED ORDER — LENALIDOMIDE 10 MG PO CAPS: ORAL_CAPSULE | ORAL | 0 refills | Status: DC

## 2022-09-26 ENCOUNTER — Other Ambulatory Visit (HOSPITAL_COMMUNITY): Payer: Self-pay

## 2022-09-28 DIAGNOSIS — Z23 Encounter for immunization: Secondary | ICD-10-CM | POA: Diagnosis not present

## 2022-09-29 ENCOUNTER — Telehealth: Payer: Self-pay | Admitting: Pharmacy Technician

## 2022-09-29 NOTE — Telephone Encounter (Signed)
Oral Oncology Patient Advocate Encounter   Received notification that patient is due for re-enrollment for assistance for Revlimid through [employer].   Re-enrollment process has been initiated and will be submitted upon completion of necessary documents.  Patient agreed to sign documents in office on 10/06/22  [employer] phone number [phone].   I will continue to follow until final determination.  Lady Deutscher, CPhT-Adv Oncology Pharmacy Patient [place] Direct Number: [phone]  Fax: [phone]

## 2022-09-30 ENCOUNTER — Telehealth: Payer: Self-pay | Admitting: Hematology and Oncology

## 2022-09-30 NOTE — Telephone Encounter (Signed)
Called patient per 11/6 in basket. Rescheduled per patient request.

## 2022-10-01 ENCOUNTER — Inpatient Hospital Stay: Payer: Medicare Other | Attending: Physician Assistant

## 2022-10-01 ENCOUNTER — Other Ambulatory Visit: Payer: Self-pay

## 2022-10-01 DIAGNOSIS — C9000 Multiple myeloma not having achieved remission: Secondary | ICD-10-CM | POA: Insufficient documentation

## 2022-10-01 DIAGNOSIS — C9001 Multiple myeloma in remission: Secondary | ICD-10-CM

## 2022-10-01 LAB — CBC WITH DIFFERENTIAL (CANCER CENTER ONLY)
Abs Immature Granulocytes: 0.01 10*3/uL (ref 0.00–0.07)
Basophils Absolute: 0 10*3/uL (ref 0.0–0.1)
Basophils Relative: 1 %
Eosinophils Absolute: 0.3 10*3/uL (ref 0.0–0.5)
Eosinophils Relative: 7 %
HCT: 37 % (ref 36.0–46.0)
Hemoglobin: 12.3 g/dL (ref 12.0–15.0)
Immature Granulocytes: 0 %
Lymphocytes Relative: 30 %
Lymphs Abs: 1.2 10*3/uL (ref 0.7–4.0)
MCH: 29.9 pg (ref 26.0–34.0)
MCHC: 33.2 g/dL (ref 30.0–36.0)
MCV: 90 fL (ref 80.0–100.0)
Monocytes Absolute: 0.5 10*3/uL (ref 0.1–1.0)
Monocytes Relative: 14 %
Neutro Abs: 1.8 10*3/uL (ref 1.7–7.7)
Neutrophils Relative %: 48 %
Platelet Count: 243 10*3/uL (ref 150–400)
RBC: 4.11 MIL/uL (ref 3.87–5.11)
RDW: 14.6 % (ref 11.5–15.5)
WBC Count: 3.8 10*3/uL — ABNORMAL LOW (ref 4.0–10.5)
nRBC: 0 % (ref 0.0–0.2)

## 2022-10-01 LAB — CMP (CANCER CENTER ONLY)
ALT: 27 U/L (ref 0–44)
AST: 25 U/L (ref 15–41)
Albumin: 4.3 g/dL (ref 3.5–5.0)
Alkaline Phosphatase: 63 U/L (ref 38–126)
Anion gap: 6 (ref 5–15)
BUN: 13 mg/dL (ref 8–23)
CO2: 25 mmol/L (ref 22–32)
Calcium: 9.3 mg/dL (ref 8.9–10.3)
Chloride: 107 mmol/L (ref 98–111)
Creatinine: 0.8 mg/dL (ref 0.44–1.00)
GFR, Estimated: >60 mL/min (ref >60)
Glucose, Bld: 82 mg/dL (ref 70–99)
Potassium: 3.9 mmol/L (ref 3.5–5.1)
Sodium: 138 mmol/L (ref 135–145)
Total Bilirubin: 0.6 mg/dL (ref 0.3–1.2)
Total Protein: 6.8 g/dL (ref 6.5–8.1)

## 2022-10-01 LAB — LACTATE DEHYDROGENASE: LDH: 152 U/L (ref 98–192)

## 2022-10-02 LAB — BETA 2 MICROGLOBULIN, SERUM: Beta-2 Microglobulin: 1.8 mg/L (ref 0.6–2.4)

## 2022-10-02 LAB — KAPPA/LAMBDA LIGHT CHAINS
Kappa free light chain: 24.4 mg/L — ABNORMAL HIGH (ref 3.3–19.4)
Kappa, lambda light chain ratio: 0.44 (ref 0.26–1.65)
Lambda free light chains: 55.2 mg/L — ABNORMAL HIGH (ref 5.7–26.3)

## 2022-10-03 NOTE — Telephone Encounter (Signed)
Oral Oncology Patient Advocate Encounter   Submitted application for assistance for Revlimid to [employer].   Application submitted via e-fax to [phone]   [facility] phone number [phone].   I will continue to check the status until final determination.   Lady Deutscher, CPhT-Adv Oncology Pharmacy Patient [place] Direct Number: [phone]  Fax: [phone]

## 2022-10-06 ENCOUNTER — Other Ambulatory Visit: Payer: Medicare Other

## 2022-10-06 LAB — MULTIPLE MYELOMA PANEL, SERUM
Albumin SerPl Elph-Mcnc: 4 g/dL (ref 2.9–4.4)
Albumin/Glob SerPl: 1.7 (ref 0.7–1.7)
Alpha 1: 0.2 g/dL (ref 0.0–0.4)
Alpha2 Glob SerPl Elph-Mcnc: 0.7 g/dL (ref 0.4–1.0)
B-Globulin SerPl Elph-Mcnc: 0.9 g/dL (ref 0.7–1.3)
Gamma Glob SerPl Elph-Mcnc: 0.8 g/dL (ref 0.4–1.8)
Globulin, Total: 2.5 g/dL (ref 2.2–3.9)
IgA: 189 mg/dL (ref 64–422)
IgG (Immunoglobin G), Serum: 765 mg/dL (ref 586–1602)
IgM (Immunoglobulin M), Srm: 23 mg/dL — ABNORMAL LOW (ref 26–217)
Total Protein ELP: 6.5 g/dL (ref 6.0–8.5)

## 2022-10-08 ENCOUNTER — Telehealth: Payer: Self-pay | Admitting: Family Medicine

## 2022-10-08 ENCOUNTER — Ambulatory Visit: Payer: Medicare Other | Admitting: Family Medicine

## 2022-10-08 ENCOUNTER — Encounter: Payer: Self-pay | Admitting: Family Medicine

## 2022-10-08 DIAGNOSIS — E782 Mixed hyperlipidemia: Secondary | ICD-10-CM

## 2022-10-08 DIAGNOSIS — Z87898 Personal history of other specified conditions: Secondary | ICD-10-CM

## 2022-10-08 DIAGNOSIS — E039 Hypothyroidism, unspecified: Secondary | ICD-10-CM

## 2022-10-08 DIAGNOSIS — I10 Essential (primary) hypertension: Secondary | ICD-10-CM

## 2022-10-08 DIAGNOSIS — G6289 Other specified polyneuropathies: Secondary | ICD-10-CM

## 2022-10-08 MED ORDER — MECLIZINE HCL 25 MG PO TABS: 25.0000 mg | ORAL_TABLET | Freq: Three times a day (TID) | ORAL | 0 refills | Status: DC | PRN

## 2022-10-08 NOTE — Telephone Encounter (Signed)
Initially scheduled for visit today - unable to connect. Called on phone after clinic - just needed a refill of meclizine - used in past for vertigo sx's, recent virus last week, negative covid testing, seems to be improving. Some congestion. Dizziness and nausea with the congestion last week - was out of meclizine.  No new HA or weakness. Dizziness is better, just wanted to have refill on hand if needed. Plans to be seen if symptoms do not continue to improve or if any worsening symptoms. Meclizine refilled.

## 2022-10-08 NOTE — Progress Notes (Signed)
Virtual Visit via Video Note Unable to connect. See telephone note - just needed refill of meclizine that had been prescribed previously.   [name], MD

## 2022-10-09 ENCOUNTER — Encounter: Payer: Self-pay | Admitting: Family Medicine

## 2022-10-09 ENCOUNTER — Telehealth: Payer: Self-pay | Admitting: Pharmacy Technician

## 2022-10-09 NOTE — Telephone Encounter (Signed)
Oral Oncology Patient Advocate Encounter  Was successful in securing patient a $12,000 grant from [facility] to provide copayment coverage for Lenalidomide.  This will keep the out of pocket expense at $0.     [employer] ID: [id number]  I have spoken with the patient.   The billing information is as follows and has been shared with [employer].    RxBin: [id number] PCN: [id number] Member ID: [id number] Group ID: [id number] Dates of Eligibility: 10/30/22 through 10/30/23  Fund:  [place], CPhT-Adv Oncology Pharmacy Patient [place] Direct Number: [phone]  Fax: [phone]

## 2022-10-09 NOTE — Telephone Encounter (Signed)
Oral Oncology Patient Advocate Encounter  Spoke with patient regarding packet she received from [facility] regarding new program guidelines.  According to the new guidelines, the patient no longer qualifies for the program based on income.   I have enrolled the patient in a grant through the [employer]. This grant will cover the cost of her lenalidomide once she is no longer enrolled in BMSPAF on 11/24/22.  I discussed the process with the patient and she voiced her understanding.  The renewal process for [employer]MS[employer] will be held at this time.  Lady Deutscher, CPhT-Adv Oncology Pharmacy Patient [place] Direct Number: [phone]  Fax: [phone]

## 2022-10-11 ENCOUNTER — Other Ambulatory Visit: Payer: Self-pay | Admitting: Family Medicine

## 2022-10-11 DIAGNOSIS — E039 Hypothyroidism, unspecified: Secondary | ICD-10-CM

## 2022-10-15 ENCOUNTER — Other Ambulatory Visit: Payer: Self-pay | Admitting: *Deleted

## 2022-10-15 MED ORDER — LENALIDOMIDE 10 MG PO CAPS: ORAL_CAPSULE | ORAL | 0 refills | Status: DC

## 2022-11-03 ENCOUNTER — Inpatient Hospital Stay: Payer: Medicare Other | Attending: Physician Assistant

## 2022-11-03 DIAGNOSIS — C9000 Multiple myeloma not having achieved remission: Secondary | ICD-10-CM | POA: Diagnosis not present

## 2022-11-03 DIAGNOSIS — C9001 Multiple myeloma in remission: Secondary | ICD-10-CM

## 2022-11-03 LAB — CMP (CANCER CENTER ONLY)
ALT: 27 U/L (ref 0–44)
AST: 27 U/L (ref 15–41)
Albumin: 4.6 g/dL (ref 3.5–5.0)
Alkaline Phosphatase: 53 U/L (ref 38–126)
Anion gap: 6 (ref 5–15)
BUN: 15 mg/dL (ref 8–23)
CO2: 28 mmol/L (ref 22–32)
Calcium: 10.4 mg/dL — ABNORMAL HIGH (ref 8.9–10.3)
Chloride: 104 mmol/L (ref 98–111)
Creatinine: 0.79 mg/dL (ref 0.44–1.00)
GFR, Estimated: >60 mL/min (ref >60)
Glucose, Bld: 89 mg/dL (ref 70–99)
Potassium: 4 mmol/L (ref 3.5–5.1)
Sodium: 138 mmol/L (ref 135–145)
Total Bilirubin: 0.9 mg/dL (ref 0.3–1.2)
Total Protein: 7.1 g/dL (ref 6.5–8.1)

## 2022-11-03 LAB — CBC WITH DIFFERENTIAL (CANCER CENTER ONLY)
Abs Immature Granulocytes: 0 10*3/uL (ref 0.00–0.07)
Basophils Absolute: 0.1 10*3/uL (ref 0.0–0.1)
Basophils Relative: 2 %
Eosinophils Absolute: 0.3 10*3/uL (ref 0.0–0.5)
Eosinophils Relative: 7 %
HCT: 39.4 % (ref 36.0–46.0)
Hemoglobin: 12.8 g/dL (ref 12.0–15.0)
Immature Granulocytes: 0 %
Lymphocytes Relative: 27 %
Lymphs Abs: 1.2 10*3/uL (ref 0.7–4.0)
MCH: 29.3 pg (ref 26.0–34.0)
MCHC: 32.5 g/dL (ref 30.0–36.0)
MCV: 90.2 fL (ref 80.0–100.0)
Monocytes Absolute: 0.4 10*3/uL (ref 0.1–1.0)
Monocytes Relative: 8 %
Neutro Abs: 2.5 10*3/uL (ref 1.7–7.7)
Neutrophils Relative %: 56 %
Platelet Count: 210 10*3/uL (ref 150–400)
RBC: 4.37 MIL/uL (ref 3.87–5.11)
RDW: 14.4 % (ref 11.5–15.5)
WBC Count: 4.5 10*3/uL (ref 4.0–10.5)
nRBC: 0 % (ref 0.0–0.2)

## 2022-11-03 LAB — LACTATE DEHYDROGENASE: LDH: 150 U/L (ref 98–192)

## 2022-11-04 LAB — BETA 2 MICROGLOBULIN, SERUM: Beta-2 Microglobulin: 1.9 mg/L (ref 0.6–2.4)

## 2022-11-04 LAB — KAPPA/LAMBDA LIGHT CHAINS
Kappa free light chain: 29.5 mg/L — ABNORMAL HIGH (ref 3.3–19.4)
Kappa, lambda light chain ratio: 0.42 (ref 0.26–1.65)
Lambda free light chains: 70.6 mg/L — ABNORMAL HIGH (ref 5.7–26.3)

## 2022-11-10 LAB — MULTIPLE MYELOMA PANEL, SERUM
Albumin SerPl Elph-Mcnc: 4.1 g/dL (ref 2.9–4.4)
Albumin/Glob SerPl: 1.6 (ref 0.7–1.7)
Alpha 1: 0.2 g/dL (ref 0.0–0.4)
Alpha2 Glob SerPl Elph-Mcnc: 0.7 g/dL (ref 0.4–1.0)
B-Globulin SerPl Elph-Mcnc: 1 g/dL (ref 0.7–1.3)
Gamma Glob SerPl Elph-Mcnc: 0.7 g/dL (ref 0.4–1.8)
Globulin, Total: 2.6 g/dL (ref 2.2–3.9)
IgA: 206 mg/dL (ref 64–422)
IgG (Immunoglobin G), Serum: 852 mg/dL (ref 586–1602)
IgM (Immunoglobulin M), Srm: 24 mg/dL — ABNORMAL LOW (ref 26–217)
Total Protein ELP: 6.7 g/dL (ref 6.0–8.5)

## 2022-11-13 ENCOUNTER — Other Ambulatory Visit: Payer: Self-pay | Admitting: *Deleted

## 2022-11-13 MED ORDER — LENALIDOMIDE 10 MG PO CAPS: ORAL_CAPSULE | ORAL | 0 refills | Status: DC

## 2022-11-14 ENCOUNTER — Other Ambulatory Visit (HOSPITAL_COMMUNITY): Payer: Self-pay

## 2022-11-18 ENCOUNTER — Other Ambulatory Visit: Payer: Self-pay | Admitting: Family Medicine

## 2022-11-18 DIAGNOSIS — I10 Essential (primary) hypertension: Secondary | ICD-10-CM

## 2022-11-18 DIAGNOSIS — E782 Mixed hyperlipidemia: Secondary | ICD-10-CM

## 2022-11-19 ENCOUNTER — Other Ambulatory Visit: Payer: Self-pay | Admitting: *Deleted

## 2022-11-19 MED ORDER — LENALIDOMIDE 10 MG PO CAPS: ORAL_CAPSULE | ORAL | 0 refills | Status: DC

## 2022-11-20 ENCOUNTER — Other Ambulatory Visit: Payer: Self-pay | Admitting: *Deleted

## 2022-11-20 MED ORDER — LENALIDOMIDE 10 MG PO CAPS: ORAL_CAPSULE | ORAL | 0 refills | Status: DC

## 2022-12-01 ENCOUNTER — Other Ambulatory Visit: Payer: Self-pay

## 2022-12-01 ENCOUNTER — Inpatient Hospital Stay: Payer: Medicare Other

## 2022-12-01 ENCOUNTER — Inpatient Hospital Stay: Payer: Medicare Other | Attending: Physician Assistant

## 2022-12-01 ENCOUNTER — Inpatient Hospital Stay (HOSPITAL_BASED_OUTPATIENT_CLINIC_OR_DEPARTMENT_OTHER): Payer: Medicare Other | Admitting: Hematology and Oncology

## 2022-12-01 VITALS — BP 131/75 | HR 61 | Temp 97.7°F | Resp 14 | Wt 135.1 lb

## 2022-12-01 DIAGNOSIS — C9000 Multiple myeloma not having achieved remission: Secondary | ICD-10-CM

## 2022-12-01 DIAGNOSIS — M899 Disorder of bone, unspecified: Secondary | ICD-10-CM | POA: Diagnosis not present

## 2022-12-01 DIAGNOSIS — Z7961 Long term (current) use of immunomodulator: Secondary | ICD-10-CM | POA: Diagnosis not present

## 2022-12-01 DIAGNOSIS — G629 Polyneuropathy, unspecified: Secondary | ICD-10-CM | POA: Insufficient documentation

## 2022-12-01 DIAGNOSIS — M549 Dorsalgia, unspecified: Secondary | ICD-10-CM | POA: Diagnosis not present

## 2022-12-01 DIAGNOSIS — Z79899 Other long term (current) drug therapy: Secondary | ICD-10-CM | POA: Diagnosis not present

## 2022-12-01 DIAGNOSIS — C9001 Multiple myeloma in remission: Secondary | ICD-10-CM

## 2022-12-01 DIAGNOSIS — Z7982 Long term (current) use of aspirin: Secondary | ICD-10-CM | POA: Diagnosis not present

## 2022-12-01 LAB — CMP (CANCER CENTER ONLY)
ALT: 30 U/L (ref 0–44)
AST: 27 U/L (ref 15–41)
Albumin: 4.2 g/dL (ref 3.5–5.0)
Alkaline Phosphatase: 48 U/L (ref 38–126)
Anion gap: 6 (ref 5–15)
BUN: 14 mg/dL (ref 8–23)
CO2: 23 mmol/L (ref 22–32)
Calcium: 9.5 mg/dL (ref 8.9–10.3)
Chloride: 106 mmol/L (ref 98–111)
Creatinine: 0.74 mg/dL (ref 0.44–1.00)
GFR, Estimated: >60 mL/min (ref >60)
Glucose, Bld: 91 mg/dL (ref 70–99)
Potassium: 4 mmol/L (ref 3.5–5.1)
Sodium: 135 mmol/L (ref 135–145)
Total Bilirubin: 0.7 mg/dL (ref 0.3–1.2)
Total Protein: 6.1 g/dL — ABNORMAL LOW (ref 6.5–8.1)

## 2022-12-01 LAB — CBC WITH DIFFERENTIAL (CANCER CENTER ONLY)
Abs Immature Granulocytes: 0.01 10*3/uL (ref 0.00–0.07)
Basophils Absolute: 0.1 10*3/uL (ref 0.0–0.1)
Basophils Relative: 1 %
Eosinophils Absolute: 0.3 10*3/uL (ref 0.0–0.5)
Eosinophils Relative: 6 %
HCT: 36.3 % (ref 36.0–46.0)
Hemoglobin: 12.2 g/dL (ref 12.0–15.0)
Immature Granulocytes: 0 %
Lymphocytes Relative: 25 %
Lymphs Abs: 1.2 10*3/uL (ref 0.7–4.0)
MCH: 29.5 pg (ref 26.0–34.0)
MCHC: 33.6 g/dL (ref 30.0–36.0)
MCV: 87.9 fL (ref 80.0–100.0)
Monocytes Absolute: 0.4 10*3/uL (ref 0.1–1.0)
Monocytes Relative: 9 %
Neutro Abs: 2.7 10*3/uL (ref 1.7–7.7)
Neutrophils Relative %: 59 %
Platelet Count: 231 10*3/uL (ref 150–400)
RBC: 4.13 MIL/uL (ref 3.87–5.11)
RDW: 14.6 % (ref 11.5–15.5)
WBC Count: 4.6 10*3/uL (ref 4.0–10.5)
nRBC: 0 % (ref 0.0–0.2)

## 2022-12-01 LAB — LACTATE DEHYDROGENASE: LDH: 140 U/L (ref 98–192)

## 2022-12-01 MED ORDER — SODIUM CHLORIDE 0.9 % IV SOLN: Freq: Once | INTRAVENOUS | Status: AC

## 2022-12-01 MED ORDER — ZOLEDRONIC ACID 4 MG/100ML IV SOLN
4.0000 mg | Freq: Once | INTRAVENOUS | Status: AC
  Administered 2022-12-01: 4 mg via INTRAVENOUS
  Filled 2022-12-01: qty 100

## 2022-12-01 NOTE — Patient Instructions (Signed)

## 2022-12-01 NOTE — Progress Notes (Signed)
Pt. states no dental issues or concerns.

## 2022-12-01 NOTE — Progress Notes (Signed)
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 Concerning for multiple myeloma vs metastatic disease 12/31/2020: establish care with Dr. Lorenso Courier. UPEP showed marked Bence Jones protein, findings concerning for multiple myleoma 01/21/2021: bone marrow biopsy confirms multiple myeloma with atypical plasma cells representing 28% of all cells in the aspirate  02/01/2021: Cycle 1 Day 1 of VRd chemotherapy 02/22/2021: Cycle 2 Day 1 of VRd chemotherapy 03/15/2021: Cycle 3 Day 1 of VRd chemotherapy 04/05/2021: Cycle 4 Day 1 of VRd chemotherapy 04/26/2021: Cycle 5 Day 1 of VRd chemotherapy 05/17/2021: Cycle 6 Day 1 of VRd chemotherapy 06/07/2021:  Cycle 7 Day 1 of VRd chemotherapy 06/28/2021: Cycle 8 Day 1 of VRd chemotherapy 07/19/2021: start maintenance revlimid '10mg'$  PO daily.   Interval History:  Doris Wilkerson 77 y.o. female with medical history significant for lambda light chain multiple myeloma who presents for a follow up visit. The patient's last visit was on 08/08/2022.  On exam today Doris Wilkerson reports she has been great in the interim since her last visit.  She reports that this time last year she was really struggling.  She had had COVID in January 2023 though she is now markedly improved from that time.  She reports that she has been doing exercise as well as getting massage.  She is also doing her best to try to wear shoes that are cushioning to lessen the degree of her neuropathy.  She reports has a little bit of back pain on occasion but overall it is quite stable.  She notes that her appetite has been  good and she has gained some weight over the holiday.  Her weight is currently 135 pounds which is stable from prior.  She notes that she is enjoying coconut cake.  She is taking her Revlimid on schedule though is currently off 1 day due to a delay in delivery.  She is currently taking her MiraLAX proactively in order to keep her bowels regular.  She is currently scheduled for Zometa today and has been tolerating the therapy well.  She denies any jaw pain or tooth pain.  She denies fevers, chills, night sweats, shortness of breath, chest pain or cough. She hsa no other complaints. A full 10 point ROS is listed below.   MEDICAL HISTORY:  Past Medical History:  Diagnosis Date   Asthma    Cataract    GERD (gastroesophageal reflux disease)    Hyperlipidemia    Hypertension    Thyroid disease     SURGICAL HISTORY: Past Surgical History:  Procedure Laterality Date   Cataract Surgery     CORONARY ANGIOPLASTY WITH STENT PLACEMENT     EYE SURGERY     ORTHOPEDIC SURGERY  2012   TONSILLECTOMY  1951    SOCIAL HISTORY: Social History   Socioeconomic History   Marital status: Married    Spouse name: Not on file   Number of children: 0   Years of education: Not on file   Highest education level: Not on file  Occupational History   Occupation: unemployed  Tobacco Use   Smoking status: Never   Smokeless tobacco: Never  Vaping Use  Vaping Use: Never used  Substance and Sexual Activity   Alcohol use: Yes    Alcohol/week: 1.0 standard drink of alcohol    Types: 1 Glasses of wine per week    Comment: occassionally   Drug use: No   Sexual activity: Not Currently  Other Topics Concern   Not on file  Social History Narrative   Married. Education: college. Pt does exercise-   Social Determinants of Health   Financial Resource Strain: Low Risk  (04/03/2022)   Overall Financial Resource Strain (CARDIA)    Difficulty of Paying Living Expenses: Not hard at all  Food Insecurity: No Food  Insecurity (04/03/2022)   Hunger Vital Sign    Worried About Running Out of Food in the Last Year: Never true    Ran Out of Food in the Last Year: Never true  Transportation Needs: No Transportation Needs (04/03/2022)   PRAPARE - Hydrologist (Medical): No    Lack of Transportation (Non-Medical): No  Physical Activity: Sufficiently Active (04/03/2022)   Exercise Vital Sign    Days of Exercise per Week: 3 days    Minutes of Exercise per Session: 50 min  Stress: No Stress Concern Present (04/03/2022)   [place]    Feeling of Stress : Not at all  Social Connections: Moderately Integrated (04/03/2022)   Social Connection and Isolation Panel [NHANES]    Frequency of Communication with Friends and Family: More than three times a week    Frequency of Social Gatherings with Friends and Family: More than three times a week    Attends Religious Services: Never    Marine scientist or Organizations: Yes    Attends Music therapist: More than 4 times per year    Marital Status: Married  Human resources officer Violence: Not At Risk (04/03/2022)   Humiliation, Afraid, Rape, and Kick questionnaire    Fear of Current or Ex-Partner: No    Emotionally Abused: No    Physically Abused: No    Sexually Abused: No    FAMILY HISTORY: Family History  Problem Relation Age of Onset   Cancer Father    Heart disease Brother    Dementia Brother    Leukemia Brother    Breast cancer Neg Hx    Colon cancer Neg Hx    Esophageal cancer Neg Hx    Pancreatic cancer Neg Hx    Rectal cancer Neg Hx    Stomach cancer Neg Hx     ALLERGIES:  is allergic to poison ivy extract, plavix [clopidogrel bisulfate], and other.  MEDICATIONS:  Current Outpatient Medications  Medication Sig Dispense Refill   polyethylene glycol (MIRALAX / GLYCOLAX) 17 g packet Take 17 g by mouth daily as needed.     acetaminophen  (TYLENOL 8 HOUR ARTHRITIS PAIN) 650 MG CR tablet Take 1 tablet (650 mg total) by mouth every 8 (eight) hours as needed for pain. (Patient not taking: Reported on 10/08/2022)     aspirin 81 MG tablet Take 81 mg by mouth daily.      atorvastatin (LIPITOR) 80 MG tablet TAKE 1 TABLET(80 MG) BY MOUTH DAILY AT 6 PM 90 tablet 1   benazepril (LOTENSIN) 10 MG tablet TAKE 1 TABLET(10 MG) BY MOUTH DAILY 90 tablet 1   calcium carbonate (TUMS - DOSED IN MG ELEMENTAL CALCIUM) 500 MG chewable tablet Chew 1 tablet by mouth daily. Daily PRN     cholecalciferol (VITAMIN  D3) 25 MCG (1000 UNIT) tablet Take 2,000 Units by mouth daily.     famotidine-calcium carbonate-magnesium hydroxide (PEPCID COMPLETE) 10-800-165 MG chewable tablet Chew 1 tablet by mouth as needed. (Patient not taking: Reported on 10/08/2022)     gabapentin (NEURONTIN) 100 MG capsule Take 1 capsule (100 mg total) by mouth 2 (two) times daily as needed. 180 capsule 1   lenalidomide (REVLIMID) 10 MG capsule TAKE 1 CAPSULE BY MOUTH DAILY  FOR 21 DAYS, THEN 7 DAYS OFF [employer] Auth # [id number]  Date Obtained: 11/13/22 21 capsule 0   levothyroxine (SYNTHROID) 25 MCG tablet TAKE 1 TABLET(25 MCG) BY MOUTH DAILY BEFORE BREAKFAST 90 tablet 3   loperamide (IMODIUM) 2 MG capsule Take by mouth as needed.     Magnesium 100 MG TABS Take 100 mg by mouth daily.     meclizine (ANTIVERT) 25 MG tablet Take 1 tablet (25 mg total) by mouth 3 (three) times daily as needed for dizziness. 30 tablet 0   Zoledronic Acid (ZOMETA IV) Inject into the vein. Every 3 months     No current facility-administered medications for this visit.    REVIEW OF SYSTEMS:   Constitutional: ( - ) fevers, ( - )  chills , ( - ) night sweats Eyes: ( - ) blurriness of vision, ( - ) double vision, ( - ) watery eyes Ears, nose, mouth, throat, and face: ( - ) mucositis, ( - ) sore throat Respiratory: ( - ) cough, ( - ) dyspnea, ( - ) wheezes Cardiovascular: ( - ) palpitation, ( - ) chest discomfort,  ( - ) lower extremity swelling Gastrointestinal:  ( - ) nausea, ( - ) heartburn, ( - ) change in bowel habits Skin: ( - ) abnormal skin rashes Lymphatics: ( - ) new lymphadenopathy, ( - ) easy bruising Neurological: ( +) numbness, ( - ) tingling, ( - ) new weaknesses Behavioral/Psych: ( - ) mood change, ( - ) new changes  All other systems were reviewed with the patient and are negative.  PHYSICAL EXAMINATION: ECOG PERFORMANCE STATUS: 0 - Asymptomatic  Vitals:   12/01/22 1126  BP: 131/75  Pulse: 61  Resp: 14  Temp: 97.7 F (36.5 C)  SpO2: 100%     Filed Weights   12/01/22 1126  Weight: 135 lb 1.6 oz (61.3 kg)     GENERAL: well appearing elderly Caucasian female in NAD  SKIN: skin color, texture, turgor are normal, no rashes or significant lesions EYES: conjunctiva are pink and non-injected, sclera clear LUNGS: clear to auscultation and percussion with normal breathing effort HEART: regular rate & rhythm and no murmurs and trace lower extremity edema Musculoskeletal: no cyanosis of digits and no clubbing  PSYCH: alert & oriented x 3, fluent speech NEURO: no focal motor/sensory deficits  LABORATORY DATA:  I have reviewed the data as listed    Latest Ref Rng & Units 12/01/2022   10:20 AM 11/03/2022   10:26 AM 10/01/2022    8:36 AM  CBC  WBC 4.0 - 10.5 K/uL 4.6  4.5  3.8   Hemoglobin 12.0 - 15.0 g/dL 12.2  12.8  12.3   Hematocrit 36.0 - 46.0 % 36.3  39.4  37.0   Platelets 150 - 400 K/uL 231  210  243        Latest Ref Rng & Units 12/01/2022   10:20 AM 11/03/2022   10:26 AM 10/01/2022    8:36 AM  CMP  Glucose 70 - 99 mg/dL 91  89  82   BUN 8 - 23 mg/dL '14  15  13   '$ Creatinine 0.44 - 1.00 mg/dL 0.74  0.79  0.80   Sodium 135 - 145 mmol/L 135  138  138   Potassium 3.5 - 5.1 mmol/L 4.0  4.0  3.9   Chloride 98 - 111 mmol/L 106  104  107   CO2 22 - 32 mmol/L '23  28  25   '$ Calcium 8.9 - 10.3 mg/dL 9.5  10.4  9.3   Total Protein 6.5 - 8.1 g/dL 6.1  7.1  6.8   Total  Bilirubin 0.3 - 1.2 mg/dL 0.7  0.9  0.6   Alkaline Phos 38 - 126 U/L 48  53  63   AST 15 - 41 U/L '27  27  25   '$ ALT 0 - 44 U/L '30  27  27     '$ Lab Results  Component Value Date   MPROTEIN Not Observed 11/03/2022   MPROTEIN Not Observed 10/01/2022   MPROTEIN Not Observed 09/08/2022   Lab Results  Component Value Date   KPAFRELGTCHN 29.5 (H) 11/03/2022   KPAFRELGTCHN 24.4 (H) 10/01/2022   KPAFRELGTCHN 27.3 (H) 09/08/2022   LAMBDASER 70.6 (H) 11/03/2022   LAMBDASER 55.2 (H) 10/01/2022   LAMBDASER 49.4 (H) 09/08/2022   KAPLAMBRATIO 0.42 11/03/2022   KAPLAMBRATIO 0.44 10/01/2022   KAPLAMBRATIO 10.96 09/11/2022    RADIOGRAPHIC STUDIES: No results found.  ASSESSMENT & PLAN Doris Wilkerson 77 y.o. female with medical history significant for lambda light chain multiple myeloma who presents for a follow up visit.   After review the labs, review the records, schedule the patient the findings most consistent with a lambda light chain multiple myeloma.  This is getting confirmed with a bone marrow biopsy showing an abnormal plasma cell population of 28% and M protein/Bence-Jones proteins in the urine.  Given these findings the patient now carries a diagnosis of lambda light chain multiple myeloma.  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  This consists of bortezomib 1.3 mg per metered squared q week, lenalidomide 25 mg p.o. x14 days of 21-day cycle, and dexamethasone 40 mg p.o. q. weekly.  This will be continued until he reached a VGPR/ complete 8 cycles of triplet therapy, at which time we can consider transition over to maintenance Revlimid 10 mg p.o. daily 21 of 28 days per cycle.  Ms. Boehm is not a candidate for bone marrow transplant based on her advanced age.  She transition to maintenance Revlimid on 07/19/2021.  # Lambda Light Chain Multiple Myeloma--VGPR on maintenance --diagnosis confirmed with bone marrow biopsy and urine protein analysis.    --patient has good functional status and would be an excellent candidate for VRd chemotherapy. I do not believe she would be a bone marrow transplant candidate based on her age.  --assure monthly restaging labs with SPEP, UPEP, and SFLC --patient declines port placement at this time --Cycle 1 Day 1 of therapy started on 02/01/2021.  --she completed Cycle 8 Day 1 of Vrd, started maintenance Revlimid on 07/19/2021. --Labs from today were reviewed and require no intervention. SPEP and sFLC pending.  Plan: -- Continue on maintenance revlimid '10mg'$  PO daily  --Monthly restaging labs with serum free light chains, UPEP, and SPEP. Last SPEP from 08/08/2022 showed undetectable M protein and SFLC show a normal ratio --Labs today show white blood cell count 4.6, hemoglobin 12.2, MCV 87.9, and platelets of 231.  Creatinine and LFTs  are within normal limits. --Return to clinic every 4 weeks for labs and 12 weeks for clinic visit and zometa.   #Back Pain -- Currently well controlled on gabapentin and Tylenol --CT thoracic and lumbar spine showed no lytic lesions or sequelae of multiple myeloma --MRI thoracic and lumbar spine from 07/07/2022 for baseline imaging. Findings showed multiple bone lesions involving posterior T9 and T8, lumbar spine and upper sacrum consistent with multiple myeloma. No extension into the canal. No pathologic fracture.  --Patient plan to follow up with Dr .Kary Kos, neurosugery --Continue to monitor  #Supportive Care --chemotherapy education complete  --zofran '8mg'$  q8H PRN and compazine '10mg'$  PO q6H for nausea --acyclovir can be d/c --zometa therapy performed on 06/12/2022. Repeat q 3 months, due in Jan 2024 -- no prescription pain medication required at this time.   No orders of the defined types were placed in this encounter.  All questions were answered. The patient knows to call the clinic with any problems, questions or concerns.  I have spent a total of 30 minutes minutes  of face-to-face and non-face-to-face time, preparing to see the patient, performing a medically appropriate examination, counseling and educating the patient, documenting clinical information in the electronic health record.   Ledell Peoples, MD Department of Hematology/Oncology [place] [facility] Phone: [phone] Pager: [phone] Email: [email or web address]   12/01/2022 5:42 PM   Literature Support:  1) Bennett Scrape Bortezomib, lenalidomide, and dexamethasone in transplant-eligible newly diagnosed multiple myeloma patients: a multicenter retrospective comparative analysis. Int Hinton Dyer. 2020 Jan;111(1):103-111. doi: 10.1007/s12185-019-02764-1.  -- Overall response, very good partial response, and complete response rates after VRD were 96.4%, 45.5%, and 20.0%, respectively (median follow-up period, 17.7 months). The 1-year progression-free survival (PFS) and overall survival rates were 95.8% and 98.2%, respectively. The response rate and PFS were similar between the groups, regardless of cytogenetic risk and age.  2) Casandra Doffing, [address] FE, Pawlyn Loletha Grayer, [place], [place], [place], [place] A, Jones JR, Kishore B, Garg M, Williams CD, Karunanithi Delaine Lame MW, Laban Emperor [place], [place], [place], Robinette Haines WM, Myra Rude; [place] [facility] [place]. Lenalidomide maintenance versus observation for patients with newly diagnosed multiple myeloma (Myeloma XI): a multicentre, open-label, randomised, phase 3 trial. Lancet Oncol. 2019 Jan;20(1):57-73. doi: 10.1016/S1470-2045(18)30687-9. Epub 2018 Dec 14.   --Maintenance therapy with lenalidomide significantly improved progression-free survival in patients with newly diagnosed multiple myeloma compared with observation   --After a median follow-up of 31  months (IQR 18-50), median progression-free survival was 39 months (95% CI 36-42) with lenalidomide and 20 months (18-22) with observation (hazard ratio [HR] 046 [95% CI 041-053]; p<00001), and 3-year overall survival was 786% (95% Cl 756-816) in the lenalidomide group and 758% (295-621) in the observation group (HR 087 [95% CI 073-105]; p=015). Progression-free survival was improved with lenalidomide compared with observation across all prespecified subgroups.

## 2022-12-02 ENCOUNTER — Telehealth: Payer: Self-pay | Admitting: Hematology and Oncology

## 2022-12-02 LAB — KAPPA/LAMBDA LIGHT CHAINS
Kappa free light chain: 27 mg/L — ABNORMAL HIGH (ref 3.3–19.4)
Kappa, lambda light chain ratio: 0.38 (ref 0.26–1.65)
Lambda free light chains: 70.4 mg/L — ABNORMAL HIGH (ref 5.7–26.3)

## 2022-12-02 LAB — BETA 2 MICROGLOBULIN, SERUM: Beta-2 Microglobulin: 1.7 mg/L (ref 0.6–2.4)

## 2022-12-02 NOTE — Telephone Encounter (Signed)
Called patient to schedule f/u appointments. Patient notified.

## 2022-12-03 ENCOUNTER — Telehealth: Payer: Self-pay | Admitting: Hematology and Oncology

## 2022-12-03 LAB — UPEP/UIFE/LIGHT CHAINS/TP, 24-HR UR
% BETA, Urine: 0 %
ALPHA 1 URINE: 0 %
Albumin, U: 100 %
Alpha 2, Urine: 0 %
Free Kappa Lt Chains,Ur: 24.35 mg/L (ref 1.17–86.46)
Free Kappa/Lambda Ratio: 11.12 (ref 1.83–14.26)
Free Lambda Lt Chains,Ur: 2.19 mg/L (ref 0.27–15.21)
GAMMA GLOBULIN URINE: 0 %
Total Protein, Urine-Ur/day: <120 mg/24 hr (ref 30–150)
Total Protein, Urine: <4 mg/dL
Total Volume: 3000

## 2022-12-03 NOTE — Telephone Encounter (Signed)
Patient called to r/s 4/5 appointment to earlier day in case of potential side effects. Patient r/s and notified.

## 2022-12-04 LAB — MULTIPLE MYELOMA PANEL, SERUM
Albumin SerPl Elph-Mcnc: 3.7 g/dL (ref 2.9–4.4)
Albumin/Glob SerPl: 1.6 (ref 0.7–1.7)
Alpha 1: 0.2 g/dL (ref 0.0–0.4)
Alpha2 Glob SerPl Elph-Mcnc: 0.6 g/dL (ref 0.4–1.0)
B-Globulin SerPl Elph-Mcnc: 0.8 g/dL (ref 0.7–1.3)
Gamma Glob SerPl Elph-Mcnc: 0.8 g/dL (ref 0.4–1.8)
Globulin, Total: 2.4 g/dL (ref 2.2–3.9)
IgA: 181 mg/dL (ref 64–422)
IgG (Immunoglobin G), Serum: 768 mg/dL (ref 586–1602)
IgM (Immunoglobulin M), Srm: 20 mg/dL — ABNORMAL LOW (ref 26–217)
Total Protein ELP: 6.1 g/dL (ref 6.0–8.5)

## 2022-12-05 ENCOUNTER — Encounter: Payer: Self-pay | Admitting: Hematology and Oncology

## 2022-12-09 ENCOUNTER — Encounter: Payer: Self-pay | Admitting: Family Medicine

## 2022-12-15 ENCOUNTER — Other Ambulatory Visit: Payer: Self-pay

## 2022-12-15 MED ORDER — LENALIDOMIDE 10 MG PO CAPS: ORAL_CAPSULE | ORAL | 0 refills | Status: DC

## 2022-12-16 ENCOUNTER — Other Ambulatory Visit: Payer: Self-pay | Admitting: Hematology and Oncology

## 2022-12-16 NOTE — Telephone Encounter (Signed)
Refilled on 12/15/22

## 2022-12-18 ENCOUNTER — Encounter: Payer: Self-pay | Admitting: Hematology and Oncology

## 2022-12-18 ENCOUNTER — Other Ambulatory Visit: Payer: Self-pay | Admitting: Hematology and Oncology

## 2022-12-18 MED ORDER — LENALIDOMIDE 10 MG PO CAPS: ORAL_CAPSULE | ORAL | 0 refills | Status: DC

## 2022-12-18 NOTE — Telephone Encounter (Signed)
Pt states her Revlimid needs to be filled at [facility]. Rx sent

## 2022-12-21 ENCOUNTER — Encounter: Payer: Self-pay | Admitting: Hematology and Oncology

## 2022-12-22 ENCOUNTER — Inpatient Hospital Stay (HOSPITAL_BASED_OUTPATIENT_CLINIC_OR_DEPARTMENT_OTHER): Payer: Medicare Other | Admitting: Physician Assistant

## 2022-12-22 ENCOUNTER — Other Ambulatory Visit: Payer: Self-pay

## 2022-12-22 ENCOUNTER — Other Ambulatory Visit (HOSPITAL_COMMUNITY): Payer: Self-pay

## 2022-12-22 VITALS — BP 141/64 | HR 66 | Temp 98.2°F | Resp 17 | Wt 135.3 lb

## 2022-12-22 DIAGNOSIS — M549 Dorsalgia, unspecified: Secondary | ICD-10-CM | POA: Diagnosis not present

## 2022-12-22 DIAGNOSIS — C9000 Multiple myeloma not having achieved remission: Secondary | ICD-10-CM | POA: Diagnosis not present

## 2022-12-22 DIAGNOSIS — Z79899 Other long term (current) drug therapy: Secondary | ICD-10-CM | POA: Diagnosis not present

## 2022-12-22 DIAGNOSIS — Z7961 Long term (current) use of immunomodulator: Secondary | ICD-10-CM | POA: Diagnosis not present

## 2022-12-22 DIAGNOSIS — C9001 Multiple myeloma in remission: Secondary | ICD-10-CM | POA: Diagnosis not present

## 2022-12-22 DIAGNOSIS — G629 Polyneuropathy, unspecified: Secondary | ICD-10-CM | POA: Diagnosis not present

## 2022-12-22 DIAGNOSIS — R21 Rash and other nonspecific skin eruption: Secondary | ICD-10-CM

## 2022-12-22 DIAGNOSIS — Z7982 Long term (current) use of aspirin: Secondary | ICD-10-CM | POA: Diagnosis not present

## 2022-12-22 MED ORDER — METHYLPREDNISOLONE 4 MG PO TBPK
ORAL_TABLET | ORAL | 0 refills | Status: DC
  Filled 2022-12-22: qty 21, 6d supply, fill #0

## 2022-12-22 NOTE — Progress Notes (Addendum)
Symptom Management Consult note [place]    Patient Care Team: [name], MD as PCP - General (Family Medicine) [name], MD as Consulting Physician (Cardiology) [name], MD (Ophthalmology)    Name of the patient: Doris Wilkerson  [id number]  1946-02-18   Date of visit: 12/22/2022   Chief Complaint/Reason for visit: rash   Current Therapy: maintenance revlimid '10mg'$  PO daily since 06/2021 and zometa q12 weeks with last treatment being 01/0//2024     ASSESSMENT & PLAN: Patient is a 77 y.o. 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 Lorenso Courier.  I have viewed most recent oncology note and lab work.    #Lambda light chain multiple myeloma  - Next appointment with oncologist is 02/24/23   #Rash -Started after taking 2 doses of generic Revlimid.  -Had recent zometa injection. No adverse reactions with those in the past. -No symptoms of anaphylaxis. Patient prescribed medrol dosepak and will discontinue generic Revlimid. Patient will need to follow up with oncologist to discuss treatment options. She is agreeable with plan. Encouraged Zyrtec if pruritus develops.  Strict ED precautions discussed should symptoms worsen.    Heme/Onc History: Oncology History Overview Note  Lambda light chain disease FISH showed del 13q, t(4;14) and dup1q   Multiple myeloma not having achieved remission ([place])  12/26/2020 Imaging   MRI pelvis  Bones: 1.5 cm rounded T2 hyperintense T1 hypointense bone lesion in the left sacral alar. Similar smaller 8 mm bone lesion in the right sacral alar. Similar smaller bone lesion in the L4 and L5 vertebral bodies. Long segment marrow edema within the left inferior pubic ramus and ischial tuberosity, with a similar larger lesion in the atrial tuberosity measuring 4.1 x 2.9 cm.. No lesions in the proximal femora.  . Joints: Preserved without evidence of effusion or subluxation.  . Soft  tissues: No soft tissue mass or fluid collection.  . Labrum: Intact  . Acetabular cartilage: Preserved  . Piriformis muscles and sciatic nerves: Symmetric.  Marland Kitchen Lower lumbosacral spine: Intact  . Tendons:Gluteus, iliopsoas, and hamstrings tendons intact.   01/11/2021 Imaging   1. Generalized osteopenia with no focal destructive or suspicious bone lesion identified radiographically. 2. Disc and endplate degeneration in the cervical spine, lumbar spine. Degenerative spondylolisthesis at L2-L3.   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 The plasma cells display lambda light chain restriction consistent with plasma cell neoplasm.  Correlation with cytogenetic and FISH studies is  recommended.    01/27/2021 Initial Diagnosis   Multiple myeloma not having achieved remission ([place])   02/01/2021 - 07/12/2021 Chemotherapy   Patient is on Treatment Plan : MYELOMA NON-TRANSPLANT CANDIDATES VRd weekly q21d     03/21/2021 Cancer Staging   Staging form: Plasma Cell Myeloma and Plasma Cell Disorders, AJCC 8th Edition - Clinical stage from 03/21/2021: Beta-2-microglobulin (mg/L): 2.2, Albumin (g/dL): 4.5, ISS: Stage I - Signed by Heath Lark, MD on 03/21/2021 Beta 2 microglobulin range (mg/L): Less than 3.5 Albumin range (g/dL): Greater than or equal to 3.5 Serum calcium level:  Elevated Pre-operative calcium level (mg/dL): 10.4 Serum creatinine level: Normal Creatinine (mg/dL): 0.8       Interval history-: Doris Wilkerson is a 77 y.o. female with oncologic history as  above presenting to [facility] today with chief complaint of rash x 1 day.  Patient presents unaccompanied to clinic.  Patient states her Revlimid prescription was recently changed to generic after she had to switch pharmacies.  She was using [facility] and had to switch to [facility] [employer] due to insurance reasons.  Patient states when she received the medication she noticed it was marked as a generic named Teva and looked different than her previous pills. She took 2 doses yesterday and when she showered last night she noticed red rash on her torso and extremities. She denies associated itching. She did not have an antihistamine at home so she took a dose of Coricidin HBP as she noticed it has an antihistamine as one of the ingredients. She denies history of similar rash. Denies shortness of breath, wheezing, nausea, vomiting.       ROS  All other systems are reviewed and are negative for acute change except as noted in the HPI.    Allergies  Allergen Reactions   Poison Ivy Extract Swelling   Plavix [Clopidogrel Bisulfate]    Other     seasonal     Past Medical History:  Diagnosis Date   Asthma    Cataract    GERD (gastroesophageal reflux disease)    Hyperlipidemia    Hypertension    Thyroid disease      Past Surgical History:  Procedure Laterality Date   Cataract Surgery     CORONARY ANGIOPLASTY WITH STENT PLACEMENT     EYE SURGERY     ORTHOPEDIC SURGERY  2012   TONSILLECTOMY  1951    Social History   Socioeconomic History   Marital status: Married    Spouse name: Not on file   Number of children: 0   Years of education: Not on file   Highest education level: Not on file  Occupational History   Occupation: unemployed  Tobacco Use   Smoking status: Never   Smokeless tobacco: Never  Vaping Use   Vaping Use: Never used  Substance and Sexual Activity   Alcohol use: Yes    Alcohol/week: 1.0 standard drink of alcohol    Types: 1 Glasses of wine per week     Comment: occassionally   Drug use: No   Sexual activity: Not Currently  Other Topics Concern   Not on file  Social History Narrative   Married. Education: college. Pt does exercise-   Social Determinants of Health   Financial Resource Strain: Low Risk  (04/03/2022)   Overall Financial Resource Strain (CARDIA)    Difficulty of Paying Living Expenses: Not hard at all  Food Insecurity: No Food Insecurity (04/03/2022)   Hunger Vital Sign    Worried About Running Out of Food in the Last Year: Never true    Ran Out of Food in the Last Year: Never true  Transportation Needs: No Transportation Needs (04/03/2022)   PRAPARE - Hydrologist (Medical): No    Lack of Transportation (Non-Medical): No  Physical Activity: Sufficiently Active (04/03/2022)   Exercise Vital Sign    Days of Exercise per Week: 3 days    Minutes of Exercise per Session: 50 min  Stress: No Stress Concern Present (04/03/2022)   [place]  Stress Questionnaire    Feeling of Stress : Not at all  Social Connections: Moderately Integrated (04/03/2022)   Social Connection and Isolation Panel [NHANES]    Frequency of Communication with Friends and Family: More than three times a week    Frequency of Social Gatherings with Friends and Family: More than three times a week    Attends Religious Services: Never    Marine scientist or Organizations: Yes    Attends Music therapist: More than 4 times per year    Marital Status: Married  Human resources officer Violence: Not At Risk (04/03/2022)   Humiliation, Afraid, Rape, and Kick questionnaire    Fear of Current or Ex-Partner: No    Emotionally Abused: No    Physically Abused: No    Sexually Abused: No    Family History  Problem Relation Age of Onset   Cancer Father    Heart disease Brother    Dementia Brother    Leukemia Brother    Breast cancer Neg Hx    Colon cancer Neg Hx    Esophageal  cancer Neg Hx    Pancreatic cancer Neg Hx    Rectal cancer Neg Hx    Stomach cancer Neg Hx      Current Outpatient Medications:    methylPREDNISolone (MEDROL DOSEPAK) 4 MG TBPK tablet, Take 6 tablets by mouth on day 1 then decrease by 1 tablet daily until finished (6-5-4-3-2-1), Disp: 21 tablet, Rfl: 0   acetaminophen (TYLENOL 8 HOUR ARTHRITIS PAIN) 650 MG CR tablet, Take 1 tablet (650 mg total) by mouth every 8 (eight) hours as needed for pain. (Patient not taking: Reported on 10/08/2022), Disp: , Rfl:    aspirin 81 MG tablet, Take 81 mg by mouth daily. , Disp: , Rfl:    atorvastatin (LIPITOR) 80 MG tablet, TAKE 1 TABLET(80 MG) BY MOUTH DAILY AT 6 PM, Disp: 90 tablet, Rfl: 1   benazepril (LOTENSIN) 10 MG tablet, TAKE 1 TABLET(10 MG) BY MOUTH DAILY, Disp: 90 tablet, Rfl: 1   calcium carbonate (TUMS - DOSED IN MG ELEMENTAL CALCIUM) 500 MG chewable tablet, Chew 1 tablet by mouth daily. Daily PRN, Disp: , Rfl:    cholecalciferol (VITAMIN D3) 25 MCG (1000 UNIT) tablet, Take 2,000 Units by mouth daily., Disp: , Rfl:    famotidine-calcium carbonate-magnesium hydroxide (PEPCID COMPLETE) 10-800-165 MG chewable tablet, Chew 1 tablet by mouth as needed. (Patient not taking: Reported on 10/08/2022), Disp: , Rfl:    gabapentin (NEURONTIN) 100 MG capsule, Take 1 capsule (100 mg total) by mouth 2 (two) times daily as needed., Disp: 180 capsule, Rfl: 1   lenalidomide (REVLIMID) 10 MG capsule, TAKE 1 CAPSULE BY MOUTH DAILY  FOR 21 DAYS, THEN 7 DAYS OFF [employer] Auth # [id number]  Date Obtained: 12/15/2022, Disp: 21 capsule, Rfl: 0   levothyroxine (SYNTHROID) 25 MCG tablet, TAKE 1 TABLET(25 MCG) BY MOUTH DAILY BEFORE BREAKFAST, Disp: 90 tablet, Rfl: 3   loperamide (IMODIUM) 2 MG capsule, Take by mouth as needed., Disp: , Rfl:    Magnesium 100 MG TABS, Take 100 mg by mouth daily., Disp: , Rfl:    meclizine (ANTIVERT) 25 MG tablet, Take 1 tablet (25 mg total) by mouth 3 (three) times daily as needed for dizziness.,  Disp: 30 tablet, Rfl: 0   polyethylene glycol (MIRALAX / GLYCOLAX) 17 g packet, Take 17 g by mouth daily as needed., Disp: , Rfl:    Zoledronic Acid (ZOMETA IV), Inject into the vein. Every 3  months, Disp: , Rfl:   PHYSICAL EXAM: ECOG FS:1 - Symptomatic but completely ambulatory    Vitals:   12/22/22 1408  BP: (!) 141/64  Pulse: 66  Resp: 17  Temp: 98.2 F (36.8 C)  TempSrc: Oral  SpO2: 100%  Weight: 135 lb 4.8 oz (61.4 kg)   Physical Exam Vitals and nursing note reviewed.  Constitutional:      Appearance: She is well-developed. She is not ill-appearing or toxic-appearing.  HENT:     Head: Normocephalic.     Nose: Nose normal.     Mouth/Throat:     Mouth: Mucous membranes are moist.     Pharynx: Oropharynx is clear.  Eyes:     Conjunctiva/sclera: Conjunctivae normal.  Neck:     Vascular: No JVD.  Cardiovascular:     Rate and Rhythm: Normal rate and regular rhythm.     Pulses: Normal pulses.     Heart sounds: Normal heart sounds.  Pulmonary:     Effort: Pulmonary effort is normal.     Breath sounds: Normal breath sounds.  Abdominal:     General: There is no distension.  Musculoskeletal:     Cervical back: Normal range of motion.  Skin:    General: Skin is warm and dry.     Findings: Rash (on torso and all extremities. Sparing face) present.     Comments: Please see media below  Neurological:     Mental Status: She is oriented to person, place, and time.        LABORATORY DATA: I have reviewed the data as listed    Latest Ref Rng & Units 12/01/2022   10:20 AM 11/03/2022   10:26 AM 10/01/2022    8:36 AM  CBC  WBC 4.0 - 10.5 K/uL 4.6  4.5  3.8   Hemoglobin 12.0 - 15.0 g/dL 12.2  12.8  12.3   Hematocrit 36.0 - 46.0 % 36.3  39.4  37.0   Platelets 150 - 400 K/uL 231  210  243         Latest Ref Rng & Units 12/01/2022   10:20 AM 11/03/2022   10:26 AM 10/01/2022    8:36 AM  CMP  Glucose 70 - 99 mg/dL 91  89  82   BUN 8 - 23 mg/dL '14  15  13   '$ Creatinine  0.44 - 1.00 mg/dL 0.74  0.79  0.80   Sodium 135 - 145 mmol/L 135  138  138   Potassium 3.5 - 5.1 mmol/L 4.0  4.0  3.9   Chloride 98 - 111 mmol/L 106  104  107   CO2 22 - 32 mmol/L '23  28  25   '$ Calcium 8.9 - 10.3 mg/dL 9.5  10.4  9.3   Total Protein 6.5 - 8.1 g/dL 6.1  7.1  6.8   Total Bilirubin 0.3 - 1.2 mg/dL 0.7  0.9  0.6   Alkaline Phos 38 - 126 U/L 48  53  63   AST 15 - 41 U/L '27  27  25   '$ ALT 0 - 44 U/L '30  27  27        '$ RADIOGRAPHIC STUDIES (from last 24 hours if applicable) I have personally reviewed the radiological images as listed and agreed with the findings in the report. No results found.      Visit Diagnosis: 1. Rash in adult   2. Multiple myeloma in remission ([place])      No orders of the defined  types were placed in this encounter.   All questions were answered. The patient knows to call the clinic with any problems, questions or concerns. No barriers to learning was detected.  I have spent a total of 30 minutes minutes of face-to-face and non-face-to-face time, preparing to see the patient, obtaining and/or reviewing separately obtained history, performing a medically appropriate examination, counseling and educating the patient, ordering medications, documenting clinical information in the electronic health record.    Thank you for allowing me to participate in the care of this patient.    Barrie Folk, PA-C Department of Hematology/Oncology [facility] at [facility] Phone: [phone]  [phone]    12/22/2022 3:34 PM

## 2022-12-22 NOTE — Progress Notes (Signed)
Orders entered for [facility].

## 2022-12-23 ENCOUNTER — Encounter: Payer: Self-pay | Admitting: Physician Assistant

## 2022-12-24 ENCOUNTER — Telehealth: Payer: Self-pay

## 2022-12-24 ENCOUNTER — Other Ambulatory Visit: Payer: Self-pay | Admitting: Family Medicine

## 2022-12-24 ENCOUNTER — Other Ambulatory Visit (HOSPITAL_COMMUNITY): Payer: Self-pay

## 2022-12-24 ENCOUNTER — Telehealth: Payer: Self-pay | Admitting: Pharmacy Technician

## 2022-12-24 ENCOUNTER — Ambulatory Visit
Admission: RE | Admit: 2022-12-24 | Discharge: 2022-12-24 | Disposition: A | Discharge status: Home / Self Care | Payer: Medicare Other | Source: Ambulatory Visit | Attending: Family Medicine | Admitting: Family Medicine

## 2022-12-24 ENCOUNTER — Other Ambulatory Visit: Payer: Self-pay

## 2022-12-24 DIAGNOSIS — M85851 Other specified disorders of bone density and structure, right thigh: Secondary | ICD-10-CM | POA: Diagnosis not present

## 2022-12-24 DIAGNOSIS — Z1231 Encounter for screening mammogram for malignant neoplasm of breast: Secondary | ICD-10-CM

## 2022-12-24 DIAGNOSIS — Z780 Asymptomatic menopausal state: Secondary | ICD-10-CM | POA: Diagnosis not present

## 2022-12-24 DIAGNOSIS — M8589 Other specified disorders of bone density and structure, multiple sites: Secondary | ICD-10-CM

## 2022-12-24 DIAGNOSIS — E782 Mixed hyperlipidemia: Secondary | ICD-10-CM

## 2022-12-24 DIAGNOSIS — M810 Age-related osteoporosis without current pathological fracture: Secondary | ICD-10-CM | POA: Diagnosis not present

## 2022-12-24 DIAGNOSIS — E039 Hypothyroidism, unspecified: Secondary | ICD-10-CM

## 2022-12-24 MED ORDER — REVLIMID 10 MG PO CAPS: 10.0000 mg | ORAL_CAPSULE | Freq: Every day | ORAL | 0 refills | Status: DC

## 2022-12-24 NOTE — Telephone Encounter (Signed)
Called patient to let her know that Dr. Lorenso Courier is aware of her rash and that Lyndee Leo RPhT is working on getting patient name brand Revlimid prescription secondary to rash from generic prescription.

## 2022-12-24 NOTE — Telephone Encounter (Signed)
Oral Oncology Patient Advocate Encounter  Patient is not able to take generic lenalidomide due to previously documented reaction. I called her insurance plan [facility] Medicare D) to verify eligibility for receiving branded medication.  Per representative, angela, brand Revlimid is covered by the insurance benefit when submitted with a DAW 1.  Patient's estimated copay is $1,143.20  Patient has an active grant at [employer] that will cover the cost of this copay.  Lady Deutscher, CPhT-Adv Oncology Pharmacy Patient [place] Direct Number: [phone]  Fax: [phone]

## 2022-12-25 ENCOUNTER — Other Ambulatory Visit: Payer: Self-pay

## 2022-12-25 NOTE — Telephone Encounter (Signed)
[employer] Auth # for Brand name Revlimid obtained on 12/25/22 is #[id number]. Called in and left VM to [facility] [phone]. Also faxed auth number to [employer].

## 2022-12-28 ENCOUNTER — Encounter: Payer: Self-pay | Admitting: Hematology and Oncology

## 2022-12-29 ENCOUNTER — Other Ambulatory Visit: Payer: Medicare Other

## 2023-01-08 ENCOUNTER — Ambulatory Visit (INDEPENDENT_AMBULATORY_CARE_PROVIDER_SITE_OTHER): Payer: Medicare Other | Admitting: Family Medicine

## 2023-01-08 ENCOUNTER — Encounter: Payer: Self-pay | Admitting: Family Medicine

## 2023-01-08 VITALS — BP 112/72 | HR 68 | Temp 98.7°F | Ht 66.0 in | Wt 135.6 lb

## 2023-01-08 DIAGNOSIS — I10 Essential (primary) hypertension: Secondary | ICD-10-CM | POA: Diagnosis not present

## 2023-01-08 DIAGNOSIS — E039 Hypothyroidism, unspecified: Secondary | ICD-10-CM

## 2023-01-08 DIAGNOSIS — E782 Mixed hyperlipidemia: Secondary | ICD-10-CM

## 2023-01-08 LAB — COMPREHENSIVE METABOLIC PANEL
ALT: 25 U/L (ref 0–35)
AST: 24 U/L (ref 0–37)
Albumin: 4.3 g/dL (ref 3.5–5.2)
Alkaline Phosphatase: 55 U/L (ref 39–117)
BUN: 20 mg/dL (ref 6–23)
CO2: 29 mEq/L (ref 19–32)
Calcium: 10 mg/dL (ref 8.4–10.5)
Chloride: 103 mEq/L (ref 96–112)
Creatinine, Ser: 0.81 mg/dL (ref 0.40–1.20)
GFR: 70.49 mL/min (ref >60.00)
Glucose, Bld: 74 mg/dL (ref 70–99)
Potassium: 4 mEq/L (ref 3.5–5.1)
Sodium: 138 mEq/L (ref 135–145)
Total Bilirubin: 0.7 mg/dL (ref 0.2–1.2)
Total Protein: 6.9 g/dL (ref 6.0–8.3)

## 2023-01-08 LAB — LIPID PANEL
Cholesterol: 154 mg/dL (ref 0–200)
HDL: 74.8 mg/dL (ref >39.00)
LDL Cholesterol: 54 mg/dL (ref 0–99)
NonHDL: 79.08
Total CHOL/HDL Ratio: 2
Triglycerides: 125 mg/dL (ref 0.0–149.0)
VLDL: 25 mg/dL (ref 0.0–40.0)

## 2023-01-08 LAB — TSH: TSH: 1.29 u[IU]/mL (ref 0.35–5.50)

## 2023-01-08 MED ORDER — BENAZEPRIL HCL 10 MG PO TABS: ORAL_TABLET | ORAL | 1 refills | Status: DC

## 2023-01-08 MED ORDER — ATORVASTATIN CALCIUM 80 MG PO TABS: ORAL_TABLET | ORAL | 1 refills | Status: DC

## 2023-01-08 NOTE — Progress Notes (Signed)
Subjective:  Patient ID: Doris Wilkerson, female    DOB: [birth year]  Age: 77 y.o. MRN: [id number]  CC:  Chief Complaint  Patient presents with   Hyperlipidemia    Pt notes she is not fasting    Hypertension   Hypothyroidism    HPI Tennyson Kirsch presents for  Follow up.    Hypertension: Benazepril 10 mg daily, no new side effects.  Home readings: none BP Readings from Last 3 Encounters:  01/08/23 112/72  12/22/22 (!) 141/64  12/01/22 131/75   Lab Results  Component Value Date   CREATININE 0.81 01/08/2023    Hyperlipidemia: Lipitor 80 mg daily, history of CAD and followed by cardiologist Dr. Einar Gip.  Denies any myalgias with medication or chest pain/dyspnea with exertion.  Appt with cardiology in April. Walking and gardening, staying active.  Breakfast 3 hrs ago.  Lab Results  Component Value Date   CHOL 154 01/08/2023   HDL 74.80 01/08/2023   LDLCALC 54 01/08/2023   TRIG 125.0 01/08/2023   CHOLHDL 2 01/08/2023   Lab Results  Component Value Date   ALT 25 01/08/2023   AST 24 01/08/2023   ALKPHOS 55 01/08/2023   BILITOT 0.7 01/08/2023   Hypothyroidism: Lab Results  Component Value Date   TSH 1.29 01/08/2023  Synthroid 25 mcg. Taking medication daily.  No new hot or cold intolerance. No new hair or skin changes, heart palpitations or new fatigue. No new unexplained, weight changes.   History of multiple myeloma followed by hematology, treated with Revlimid for maintenance and Zometa, Tylenol, gabapentin as needed for nerve pain - 2 per day. Doing ok. Neurosurgeon Dr. Saintclair Halsted. Had rash/reaction from generic revlimid - improved with medrol Dosepak and has remained off revlimid. Trying to get back on brand name revlimid.    HM: Covid booster: in fall.  Flu vaccine: in fall.  History Patient Active Problem List   Diagnosis Date Noted   Mixed hyperlipidemia 01/11/2023   Essential hypertension 01/11/2023   Physical debility 03/21/2021   Dizziness  03/21/2021   Vertigo 03/21/2021   Anemia due to antineoplastic chemotherapy 03/21/2021   Skin changes related to chemotherapy 03/21/2021   Other constipation 03/21/2021   Multiple myeloma not having achieved remission ([place]) 01/27/2021   Hordeolum externum of left lower eyelid 06/28/2018   CAD (coronary artery disease) 12/09/2011   Dyslipidemia 12/09/2011   Osteopenia 12/09/2011   Hypothyroid 12/09/2011   Asthma 12/09/2011    Past Medical History:  Diagnosis Date   Asthma    Cataract    GERD (gastroesophageal reflux disease)    Hyperlipidemia    Hypertension    Thyroid disease       Review of Systems  Per HPI.  Objective:   Vitals:   01/08/23 1026  BP: 112/72  Pulse: 68  Temp: 98.7 F (37.1 C)  TempSrc: Temporal  SpO2: 96%  Weight: 135 lb 9.6 oz (61.5 kg)  Height: 5' 6"$  (1.676 m)     Physical Exam Vitals reviewed.  Constitutional:      Appearance: Normal appearance. She is well-developed.  HENT:     Head: Normocephalic and atraumatic.  Eyes:     Conjunctiva/sclera: Conjunctivae normal.     Pupils: Pupils are equal, round, and reactive to light.  Neck:     Vascular: No carotid bruit.  Cardiovascular:     Rate and Rhythm: Normal rate and regular rhythm.     Heart sounds: Normal heart sounds.  Pulmonary:  Effort: Pulmonary effort is normal.     Breath sounds: Normal breath sounds.  Abdominal:     Palpations: Abdomen is soft. There is no pulsatile mass.     Tenderness: There is no abdominal tenderness.  Musculoskeletal:     Right lower leg: No edema.     Left lower leg: No edema.  Skin:    General: Skin is warm and dry.  Neurological:     Mental Status: She is alert and oriented to person, place, and time.  Psychiatric:        Mood and Affect: Mood normal.        Behavior: Behavior normal.        Assessment & Plan:  Doris Wilkerson is a 77 y.o. female . Mixed hyperlipidemia Assessment & Plan: Tolerating current dose of statin, check  labs, plan/med adjustment accordingly based on results  Orders: -     Lipid panel -     Comprehensive metabolic panel -     Atorvastatin Calcium; TAKE 1 TABLET(80 MG) BY MOUTH DAILY AT 6 PM  Dispense: 90 tablet; Refill: 1  Essential hypertension Assessment & Plan: Tolerating current regimen of lisinopril, check labs, medication/plan adjustment accordingly but no changes for now.  Orders: -     Benazepril HCl; TAKE 1 TABLET(10 MG) BY MOUTH DAILY  Dispense: 90 tablet; Refill: 1  Hypothyroidism, unspecified type Assessment & Plan: Check labs, medication adjustment accordingly.  Orders: -     TSH  Hyperlipemia, mixed -     Benazepril HCl; TAKE 1 TABLET(10 MG) BY MOUTH DAILY  Dispense: 90 tablet; Refill: 1 -     Atorvastatin Calcium; TAKE 1 TABLET(80 MG) BY MOUTH DAILY AT 6 PM  Dispense: 90 tablet; Refill: 1    Patient Instructions  Contact hematologist if you have not heard about name brand Revlimid decision in the next week.  No med changes today. I will let you know if any concerns on labs.   Take care!      Signed,   [name], MD [place] 01/11/23 6:07 PM

## 2023-01-08 NOTE — Patient Instructions (Addendum)
Contact hematologist if you have not heard about name brand Revlimid decision in the next week.  No med changes today. I will let you know if any concerns on labs.   Take care!

## 2023-01-11 ENCOUNTER — Encounter: Payer: Self-pay | Admitting: Family Medicine

## 2023-01-11 DIAGNOSIS — I10 Essential (primary) hypertension: Secondary | ICD-10-CM | POA: Insufficient documentation

## 2023-01-11 DIAGNOSIS — E782 Mixed hyperlipidemia: Secondary | ICD-10-CM | POA: Insufficient documentation

## 2023-01-11 NOTE — Assessment & Plan Note (Signed)
Tolerating current dose of statin, check labs, plan/med adjustment accordingly based on results

## 2023-01-11 NOTE — Assessment & Plan Note (Signed)
Check labs, medication adjustment accordingly.

## 2023-01-11 NOTE — Assessment & Plan Note (Signed)
Tolerating current regimen of lisinopril, check labs, medication/plan adjustment accordingly but no changes for now.

## 2023-01-22 ENCOUNTER — Other Ambulatory Visit: Payer: Self-pay

## 2023-01-29 ENCOUNTER — Telehealth: Payer: Self-pay | Admitting: *Deleted

## 2023-01-29 NOTE — Telephone Encounter (Signed)
LM to call regarding Revlimid.

## 2023-01-29 NOTE — Telephone Encounter (Signed)
Pt returned a call from [facility] and said she has 2 Revlimid left and she takes her last one on Saturday. She will be off a week and confirmed she will need a refill

## 2023-01-30 ENCOUNTER — Other Ambulatory Visit: Payer: Self-pay

## 2023-01-30 ENCOUNTER — Inpatient Hospital Stay: Payer: Medicare Other | Attending: Physician Assistant

## 2023-01-30 DIAGNOSIS — C9000 Multiple myeloma not having achieved remission: Secondary | ICD-10-CM

## 2023-01-30 DIAGNOSIS — C9001 Multiple myeloma in remission: Secondary | ICD-10-CM

## 2023-01-30 LAB — CMP (CANCER CENTER ONLY)
ALT: 28 U/L (ref 0–44)
AST: 24 U/L (ref 15–41)
Albumin: 4.6 g/dL (ref 3.5–5.0)
Alkaline Phosphatase: 54 U/L (ref 38–126)
Anion gap: 5 (ref 5–15)
BUN: 17 mg/dL (ref 8–23)
CO2: 29 mmol/L (ref 22–32)
Calcium: 9.9 mg/dL (ref 8.9–10.3)
Chloride: 104 mmol/L (ref 98–111)
Creatinine: 0.87 mg/dL (ref 0.44–1.00)
GFR, Estimated: >60 mL/min (ref >60)
Glucose, Bld: 85 mg/dL (ref 70–99)
Potassium: 4.2 mmol/L (ref 3.5–5.1)
Sodium: 138 mmol/L (ref 135–145)
Total Bilirubin: 0.9 mg/dL (ref 0.3–1.2)
Total Protein: 7.3 g/dL (ref 6.5–8.1)

## 2023-01-30 LAB — CBC WITH DIFFERENTIAL (CANCER CENTER ONLY)
Abs Immature Granulocytes: 0.01 10*3/uL (ref 0.00–0.07)
Basophils Absolute: 0.1 10*3/uL (ref 0.0–0.1)
Basophils Relative: 1 %
Eosinophils Absolute: 0.3 10*3/uL (ref 0.0–0.5)
Eosinophils Relative: 6 %
HCT: 39.1 % (ref 36.0–46.0)
Hemoglobin: 12.9 g/dL (ref 12.0–15.0)
Immature Granulocytes: 0 %
Lymphocytes Relative: 28 %
Lymphs Abs: 1.4 10*3/uL (ref 0.7–4.0)
MCH: 29.5 pg (ref 26.0–34.0)
MCHC: 33 g/dL (ref 30.0–36.0)
MCV: 89.3 fL (ref 80.0–100.0)
Monocytes Absolute: 0.8 10*3/uL (ref 0.1–1.0)
Monocytes Relative: 17 %
Neutro Abs: 2.4 10*3/uL (ref 1.7–7.7)
Neutrophils Relative %: 48 %
Platelet Count: 222 10*3/uL (ref 150–400)
RBC: 4.38 MIL/uL (ref 3.87–5.11)
RDW: 14.3 % (ref 11.5–15.5)
WBC Count: 4.9 10*3/uL (ref 4.0–10.5)
nRBC: 0 % (ref 0.0–0.2)

## 2023-01-30 LAB — LACTATE DEHYDROGENASE: LDH: 150 U/L (ref 98–192)

## 2023-01-30 MED ORDER — REVLIMID 10 MG PO CAPS: 10.0000 mg | ORAL_CAPSULE | Freq: Every day | ORAL | 0 refills | Status: DC

## 2023-01-31 LAB — BETA 2 MICROGLOBULIN, SERUM: Beta-2 Microglobulin: 1.8 mg/L (ref 0.6–2.4)

## 2023-02-02 LAB — KAPPA/LAMBDA LIGHT CHAINS
Kappa free light chain: 29.4 mg/L — ABNORMAL HIGH (ref 3.3–19.4)
Kappa, lambda light chain ratio: 0.25 — ABNORMAL LOW (ref 0.26–1.65)
Lambda free light chains: 119.3 mg/L — ABNORMAL HIGH (ref 5.7–26.3)

## 2023-02-05 LAB — MULTIPLE MYELOMA PANEL, SERUM
Albumin SerPl Elph-Mcnc: 3.9 g/dL (ref 2.9–4.4)
Albumin/Glob SerPl: 1.5 (ref 0.7–1.7)
Alpha 1: 0.5 g/dL — ABNORMAL HIGH (ref 0.0–0.4)
Alpha2 Glob SerPl Elph-Mcnc: 0.6 g/dL (ref 0.4–1.0)
B-Globulin SerPl Elph-Mcnc: 0.9 g/dL (ref 0.7–1.3)
Gamma Glob SerPl Elph-Mcnc: 0.7 g/dL (ref 0.4–1.8)
Globulin, Total: 2.7 g/dL (ref 2.2–3.9)
IgA: 208 mg/dL (ref 64–422)
IgG (Immunoglobin G), Serum: 800 mg/dL (ref 586–1602)
IgM (Immunoglobulin M), Srm: 28 mg/dL (ref 26–217)
Total Protein ELP: 6.6 g/dL (ref 6.0–8.5)

## 2023-02-23 ENCOUNTER — Encounter: Payer: Self-pay | Admitting: Cardiology

## 2023-02-23 ENCOUNTER — Ambulatory Visit: Payer: Medicare Other | Admitting: Cardiology

## 2023-02-23 VITALS — BP 101/60 | HR 68 | Resp 16 | Ht 66.0 in | Wt 136.0 lb

## 2023-02-23 DIAGNOSIS — I2510 Atherosclerotic heart disease of native coronary artery without angina pectoris: Secondary | ICD-10-CM | POA: Diagnosis not present

## 2023-02-23 DIAGNOSIS — I10 Essential (primary) hypertension: Secondary | ICD-10-CM | POA: Diagnosis not present

## 2023-02-23 DIAGNOSIS — E7800 Pure hypercholesterolemia, unspecified: Secondary | ICD-10-CM

## 2023-02-23 NOTE — Progress Notes (Unsigned)
[place] [phone]   [phone]  PROGRESS NOTE  Patient Care Team: [name], MD as PCP - General (Family Medicine) [name], MD as Consulting Physician (Cardiology) [name], MD (Ophthalmology)  Hematological/Oncological History # Lambda Light Chain Multiple Myeloma 12/26/2020: MRI of pelvis showed lytic lesions on L4, L5, the sacrum, with pathologic fracture of left inferior pubic ramus. Concerning for multiple myeloma vs metastatic disease 12/31/2020: establish care with Dr. Lorenso Courier. UPEP showed marked Bence Jones protein, findings concerning for multiple myleoma 01/21/2021: bone marrow biopsy confirms multiple myeloma with atypical plasma cells representing 28% of all cells in the aspirate  02/01/2021: Cycle 1 Day 1 of VRd chemotherapy 02/22/2021: Cycle 2 Day 1 of VRd chemotherapy 03/15/2021: Cycle 3 Day 1 of VRd chemotherapy 04/05/2021: Cycle 4 Day 1 of VRd chemotherapy 04/26/2021: Cycle 5 Day 1 of VRd chemotherapy 05/17/2021: Cycle 6 Day 1 of VRd chemotherapy 06/07/2021:  Cycle 7 Day 1 of VRd chemotherapy 06/28/2021: Cycle 8 Day 1 of VRd chemotherapy 07/19/2021: start maintenance revlimid 10mg  PO daily.   Interval History:  Doris Wilkerson 77 y.o. female with medical history significant for lambda light chain multiple myeloma who presents for a follow up visit. The patient's last visit was on 12/01/2021.  On exam today Doris Wilkerson reports she enjoyed the warm weather this weekend and was gardening all weekend.  She reports that she feels quite good occasionally takes rest days where she reads a book or walks.  She is looking forward to the science everywhere weekend at [facility].  She reports her energy levels have been good and her back pain has been very manageable.  She notes it is predominantly a positional issue at this time and the pain is mostly worse in her buttocks.  She notes her weight has been stable and her appetite has been good.  She  feels she is not eating well but her weight is steady at 135 pounds.  Overall her health has been steady in the interim since her last visit.  She notes that she is willing and able to proceed with maintenance Revlimid at this time as well as her last dose of Zometa today.  She denies fevers, chills, night sweats, shortness of breath, chest pain or cough. She has no other complaints. A full 10 point ROS is listed below.   Today we also discussed the results of her last kappa lambda light chain test.  Lambda chains appear to be rising.  We discussed possible treatment options in the event this were to represent progression.  MEDICAL HISTORY:  Past Medical History:  Diagnosis Date   Asthma    Cataract    GERD (gastroesophageal reflux disease)    Hyperlipidemia    Hypertension    Thyroid disease     SURGICAL HISTORY: Past Surgical History:  Procedure Laterality Date   Cataract Surgery     CORONARY ANGIOPLASTY WITH STENT PLACEMENT     EYE SURGERY     ORTHOPEDIC SURGERY  2012   TONSILLECTOMY  1951    SOCIAL HISTORY: Social History   Socioeconomic History   Marital status: Married    Spouse name: Not on file   Number of children: 0   Years of education: Not on file   Highest education level: Not on file  Occupational History   Occupation: unemployed  Tobacco Use   Smoking status: Never   Smokeless tobacco: Never  Vaping Use   Vaping Use: Never used  Substance and Sexual Activity   Alcohol use: Yes    Alcohol/week: 1.0 standard drink of alcohol    Types: 1 Glasses of wine per week    Comment: occassionally   Drug use: No   Sexual activity: Not Currently  Other Topics Concern   Not on file  Social History Narrative   Married. Education: college. Pt does exercise-   Social Determinants of Health   Financial Resource Strain: Low Risk  (04/03/2022)   Overall Financial Resource Strain (CARDIA)    Difficulty of Paying Living Expenses: Not hard at all  Food Insecurity: No  Food Insecurity (04/03/2022)   Hunger Vital Sign    Worried About Running Out of Food in the Last Year: Never true    Ran Out of Food in the Last Year: Never true  Transportation Needs: No Transportation Needs (04/03/2022)   PRAPARE - Hydrologist (Medical): No    Lack of Transportation (Non-Medical): No  Physical Activity: Sufficiently Active (04/03/2022)   Exercise Vital Sign    Days of Exercise per Week: 3 days    Minutes of Exercise per Session: 50 min  Stress: No Stress Concern Present (04/03/2022)   [place]    Feeling of Stress : Not at all  Social Connections: Moderately Integrated (04/03/2022)   Social Connection and Isolation Panel [NHANES]    Frequency of Communication with Friends and Family: More than three times a week    Frequency of Social Gatherings with Friends and Family: More than three times a week    Attends Religious Services: Never    Marine scientist or Organizations: Yes    Attends Music therapist: More than 4 times per year    Marital Status: Married  Human resources officer Violence: Not At Risk (04/03/2022)   Humiliation, Afraid, Rape, and Kick questionnaire    Fear of Current or Ex-Partner: No    Emotionally Abused: No    Physically Abused: No    Sexually Abused: No    FAMILY HISTORY: Family History  Problem Relation Age of Onset   Cancer Father    Heart disease Brother    Dementia Brother    Leukemia Brother    Breast cancer Neg Hx    Colon cancer Neg Hx    Esophageal cancer Neg Hx    Pancreatic cancer Neg Hx    Rectal cancer Neg Hx    Stomach cancer Neg Hx     ALLERGIES:  is allergic to poison ivy extract, plavix [clopidogrel bisulfate], and other.  MEDICATIONS:  Current Outpatient Medications  Medication Sig Dispense Refill   aspirin 81 MG tablet Take 81 mg by mouth daily.      atorvastatin (LIPITOR) 80 MG tablet TAKE 1 TABLET(80  MG) BY MOUTH DAILY AT 6 PM 90 tablet 1   benazepril (LOTENSIN) 10 MG tablet TAKE 1 TABLET(10 MG) BY MOUTH DAILY 90 tablet 1   calcium carbonate (TUMS - DOSED IN MG ELEMENTAL CALCIUM) 500 MG chewable tablet Chew 1 tablet by mouth daily. Daily PRN     cholecalciferol (VITAMIN D3) 25 MCG (1000 UNIT) tablet Take 2,000 Units by mouth daily.     gabapentin (NEURONTIN) 100 MG capsule Take 1 capsule (100 mg total) by mouth 2 (two) times daily as needed. 180 capsule 1   levothyroxine (SYNTHROID) 25 MCG tablet TAKE 1 TABLET(25 MCG) BY MOUTH DAILY BEFORE BREAKFAST 90 tablet 3   Magnesium 100  MG TABS Take 100 mg by mouth daily.     polyethylene glycol (MIRALAX / GLYCOLAX) 17 g packet Take 17 g by mouth daily as needed.     REVLIMID 10 MG capsule Take 1 capsule (10 mg total) by mouth daily. Take 1 tablet daily for 21 days and then off for 7 days 21 capsule 0   Zoledronic Acid (ZOMETA IV) Inject into the vein. Every 3 months     No current facility-administered medications for this visit.    REVIEW OF SYSTEMS:   Constitutional: ( - ) fevers, ( - )  chills , ( - ) night sweats Eyes: ( - ) blurriness of vision, ( - ) double vision, ( - ) watery eyes Ears, nose, mouth, throat, and face: ( - ) mucositis, ( - ) sore throat Respiratory: ( - ) cough, ( - ) dyspnea, ( - ) wheezes Cardiovascular: ( - ) palpitation, ( - ) chest discomfort, ( - ) lower extremity swelling Gastrointestinal:  ( - ) nausea, ( - ) heartburn, ( - ) change in bowel habits Skin: ( - ) abnormal skin rashes Lymphatics: ( - ) new lymphadenopathy, ( - ) easy bruising Neurological: ( +) numbness, ( - ) tingling, ( - ) new weaknesses Behavioral/Psych: ( - ) mood change, ( - ) new changes  All other systems were reviewed with the patient and are negative.  PHYSICAL EXAMINATION: ECOG PERFORMANCE STATUS: 0 - Asymptomatic  There were no vitals filed for this visit. There were no vitals filed for this visit.  GENERAL: well appearing elderly  Caucasian female in NAD  SKIN: skin color, texture, turgor are normal, no rashes or significant lesions EYES: conjunctiva are pink and non-injected, sclera clear LUNGS: clear to auscultation and percussion with normal breathing effort HEART: regular rate & rhythm and no murmurs and trace lower extremity edema Musculoskeletal: no cyanosis of digits and no clubbing  PSYCH: alert & oriented x 3, fluent speech NEURO: no focal motor/sensory deficits  LABORATORY DATA:  I have reviewed the data as listed    Latest Ref Rng & Units 01/30/2023    9:57 AM 12/01/2022   10:20 AM 11/03/2022   10:26 AM  CBC  WBC 4.0 - 10.5 K/uL 4.9  4.6  4.5   Hemoglobin 12.0 - 15.0 g/dL 12.9  12.2  12.8   Hematocrit 36.0 - 46.0 % 39.1  36.3  39.4   Platelets 150 - 400 K/uL 222  231  210        Latest Ref Rng & Units 01/30/2023    9:57 AM 01/08/2023   11:21 AM 12/01/2022   10:20 AM  CMP  Glucose 70 - 99 mg/dL 85  74  91   BUN 8 - 23 mg/dL 17  20  14    Creatinine 0.44 - 1.00 mg/dL 0.87  0.81  0.74   Sodium 135 - 145 mmol/L 138  138  135   Potassium 3.5 - 5.1 mmol/L 4.2  4.0  4.0   Chloride 98 - 111 mmol/L 104  103  106   CO2 22 - 32 mmol/L 29  29  23    Calcium 8.9 - 10.3 mg/dL 9.9  10.0  9.5   Total Protein 6.5 - 8.1 g/dL 7.3  6.9  6.1   Total Bilirubin 0.3 - 1.2 mg/dL 0.9  0.7  0.7   Alkaline Phos 38 - 126 U/L 54  55  48   AST 15 - 41 U/L 24  24  27   ALT 0 - 44 U/L 28  25  30      Lab Results  Component Value Date   MPROTEIN Not Observed 01/30/2023   MPROTEIN Not Observed 12/01/2022   MPROTEIN Not Observed 11/03/2022   Lab Results  Component Value Date   KPAFRELGTCHN 29.4 (H) 01/30/2023   KPAFRELGTCHN 27.0 (H) 12/01/2022   KPAFRELGTCHN 29.5 (H) 11/03/2022   LAMBDASER 119.3 (H) 01/30/2023   LAMBDASER 70.4 (H) 12/01/2022   LAMBDASER 70.6 (H) 11/03/2022   KAPLAMBRATIO 0.25 (L) 01/30/2023   KAPLAMBRATIO 11.12 12/01/2022   KAPLAMBRATIO 0.38 12/01/2022    RADIOGRAPHIC STUDIES: No results  found.  ASSESSMENT & PLAN Doris Wilkerson 77 y.o. female with medical history significant for lambda light chain multiple myeloma who presents for a follow up visit.   After review the labs, review the records, schedule the patient the findings most consistent with a lambda light chain multiple myeloma.  This is getting confirmed with a bone marrow biopsy showing an abnormal plasma cell population of 28% and M protein/Bence-Jones proteins in the urine.  Given these findings the patient now carries a diagnosis of lambda light chain multiple myeloma.  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  This consists of bortezomib 1.3 mg per metered squared q week, lenalidomide 25 mg p.o. x14 days of 21-day cycle, and dexamethasone 40 mg p.o. q. weekly.  This will be continued until he reached a VGPR/ complete 8 cycles of triplet therapy, at which time we can consider transition over to maintenance Revlimid 10 mg p.o. daily 21 of 28 days per cycle.  Ms. Menasco is not a candidate for bone marrow transplant based on her advanced age.  She transition to maintenance Revlimid on 07/19/2021.  # Lambda Light Chain Multiple Myeloma--VGPR on maintenance --diagnosis confirmed with bone marrow biopsy and urine protein analysis.   --patient has good functional status and would be an excellent candidate for VRd chemotherapy. I do not believe she would be a bone marrow transplant candidate based on her age.  --assure monthly restaging labs with SPEP, UPEP, and SFLC --patient declines port placement at this time --Cycle 1 Day 1 of therapy started on 02/01/2021.  --she completed Cycle 8 Day 1 of Vrd, started maintenance Revlimid on 07/19/2021. --Labs from today were reviewed and require no intervention. SPEP and sFLC pending.  Plan: -- Continue on maintenance revlimid 10mg  PO daily 21 days on, 7 days 37ff --Monthly restaging labs with serum free light chains, UPEP, and SPEP. Last SPEP from  08/08/2022 showed undetectable M protein and SFLC show an increase level of Lambda light chains.  Lambda light chains currently up to 119.3 with a ratio of 0.25 --Labs today show white blood cell count 5.7, hemoglobin 12.1, MCV 87.9, and platelets of 227.  Creatinine and LFTs are within normal limits. --Return to clinic every 4 weeks for labs and 12 weeks for clinic visit and zometa.   #Back Pain -- Currently well controlled on gabapentin and Tylenol --CT thoracic and lumbar spine showed no lytic lesions or sequelae of multiple myeloma --MRI thoracic and lumbar spine from 07/07/2022 for baseline imaging. Findings showed multiple bone lesions involving posterior T9 and T8, lumbar spine and upper sacrum consistent with multiple myeloma. No extension into the canal. No pathologic fracture.  --Patient plan to follow up with Dr .Kary Kos, neurosugery --Continue to monitor  #Supportive Care --chemotherapy education complete  --zofran 8mg  q8H PRN and compazine 10mg  PO  q6H for nausea --acyclovir can be d/c --zometa therapy performed on 06/12/2022. Repeat q 3 months, due in April 2024 -- no prescription pain medication required at this time.   No orders of the defined types were placed in this encounter.  All questions were answered. The patient knows to call the clinic with any problems, questions or concerns.  I have spent a total of 30 minutes minutes of face-to-face and non-face-to-face time, preparing to see the patient, performing a medically appropriate examination, counseling and educating the patient, documenting clinical information in the electronic health record.   Ledell Peoples, MD Department of Hematology/Oncology [place] [facility] Phone: [phone] Pager: [phone] Email: [email or web address]   02/23/2023 2:54 PM   Literature Support:  1) Bennett Scrape Bortezomib, lenalidomide, and dexamethasone in transplant-eligible newly diagnosed multiple myeloma patients: a multicenter retrospective comparative analysis. Int Hinton Dyer. 2020 Jan;111(1):103-111. doi: 10.1007/s12185-019-02764-1.  -- Overall response, very good partial response, and complete response rates after VRD were 96.4%, 45.5%, and 20.0%, respectively (median follow-up period, 17.7 months). The 1-year progression-free survival (PFS) and overall survival rates were 95.8% and 98.2%, respectively. The response rate and PFS were similar between the groups, regardless of cytogenetic risk and age.  2) Casandra Doffing, [address] FE, Pawlyn Loletha Grayer, [place], [place], [place], [place] A, Jones JR, Kishore B, Garg M, Williams CD, Karunanithi Delaine Lame MW, Laban Emperor NH, [place], Drayson MT, Robinette Haines WM, Myra Rude; [place] [facility]. Lenalidomide maintenance versus observation for patients with newly diagnosed multiple myeloma (Myeloma XI): a multicentre, open-label, randomised, phase 3 trial. Lancet Oncol. 2019 Jan;20(1):57-73. doi: 10.1016/S1470-2045(18)30687-9. Epub 2018 Dec 14.   --Maintenance therapy with lenalidomide significantly improved progression-free survival in patients with newly diagnosed multiple myeloma compared with observation   --After a median follow-up of 31 months (IQR 18-50), median progression-free survival was 39 months (95% CI 36-42) with lenalidomide and 20 months (18-22) with observation (hazard ratio [HR] 046 [95% CI 041-053]; p<00001), and 3-year overall survival was 786% (95% Cl 756-816) in the lenalidomide group and 758% [id number]) in the observation group (HR 087 [95% CI 073-105]; p=015). Progression-free survival was improved with lenalidomide compared with observation across all prespecified subgroups.

## 2023-02-23 NOTE — Progress Notes (Signed)
Primary Physician/Referring:  [name], MD  Patient ID: Doris Wilkerson, female    DOB: 1946/02/13, 77 y.o.   MRN: [id number]  Chief Complaint  Patient presents with   Coronary Artery Disease   Follow-up    1 year   HPI:    Doris Wilkerson  is a 77 y.o. 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  Past medical history significant for hypertension, hyperlipidemia. 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  Except for paresthesias of the lower extremity right worse than the left, she has no specific complaints, denies chest pain, dyspnea, PND or orthopnea.  Past Medical History:  Diagnosis Date   Asthma    Cataract    GERD (gastroesophageal reflux disease)    Hyperlipidemia    Hypertension    Thyroid disease    Social History   Tobacco Use   Smoking status: Never   Smokeless tobacco: Never  Substance Use Topics   Alcohol use: Yes    Alcohol/week: 1.0 standard drink of alcohol    Types: 1 Glasses of wine per week    Comment: occassionally   Marital Status: Married  ROS  Review of Systems  Cardiovascular:  Negative for chest pain, claudication, dyspnea on exertion and leg swelling.  Neurological:  Positive for numbness (feet) and paresthesias.   Objective  Blood pressure 101/60, pulse 68, resp. rate 16, height 5\' 6"  (1.676 m), weight 136 lb (61.7 kg), SpO2 99 %. Body mass index is 21.95 kg/m.     02/23/2023   10:47 AM 01/08/2023   10:26 AM 12/22/2022    2:08 PM  Vitals with BMI  Height 5\' 6"  5\' 6"    Weight 136 lbs 135 lbs 10 oz 135 lbs 5 oz  BMI [id number]   Systolic [id number]  Diastolic 60 72 64  Pulse 68 68 66    Physical Exam Neck:     Vascular: No carotid bruit or JVD.  Cardiovascular:     Rate and Rhythm: Normal rate and regular rhythm.     Pulses:           Carotid pulses are 2+ on the right side and 2+ on the left side.      Femoral pulses are 2+ on the right side and 2+ on the left side.      Popliteal pulses are 2+ on the right side and 2+ on the left side.       Dorsalis pedis pulses are 0 on the right side and 2+ on the left side.       Posterior tibial pulses are 0 on the right side and 2+ on the left side.     Heart sounds: Normal heart sounds. No murmur heard.    No gallop.  Pulmonary:     Effort: Pulmonary effort is normal.     Breath sounds: Normal breath sounds.  Abdominal:     General: Bowel sounds are normal.     Palpations: Abdomen is soft.  Musculoskeletal:     Right lower leg: No edema.     Left lower leg: No edema.     Laboratory examination:   Recent Labs    11/03/22 1026 12/01/22 1020 01/08/23 1121 01/30/23 0957  NA 138 135 138 138  K 4.0 4.0 4.0 4.2  CL 104 106 103 104  CO2 28 23 29 29   GLUCOSE 89 91 74 85  BUN 15 14 20 17   CREATININE 0.79 0.74 0.81 0.87  CALCIUM 10.4* 9.5 10.0 9.9  GFRNONAA >60 >60  --  >60      Latest Ref Rng & Units 01/30/2023    9:57 AM 01/08/2023   11:21 AM 12/01/2022   10:20 AM  CMP  Glucose 70 - 99 mg/dL 85  74  91   BUN 8 - 23 mg/dL 17  20  14    Creatinine 0.44 - 1.00 mg/dL 0.87  0.81  0.74   Sodium 135 - 145 mmol/L 138  138  135   Potassium 3.5 - 5.1 mmol/L 4.2  4.0  4.0   Chloride 98 - 111 mmol/L 104  103  106   CO2 22 - 32 mmol/L 29  29  23    Calcium 8.9 - 10.3 mg/dL 9.9  10.0  9.5   Total Protein 6.5 - 8.1 g/dL 7.3  6.9  6.1   Total Bilirubin 0.3 - 1.2 mg/dL 0.9  0.7  0.7   Alkaline Phos 38 - 126 U/L 54  55  48   AST 15 - 41 U/L 24  24  27    ALT 0 - 44 U/L 28  25  30        Latest Ref Rng & Units 01/30/2023    9:57 AM 12/01/2022   10:20 AM 11/03/2022   10:26 AM  CBC  WBC 4.0 - 10.5 K/uL 4.9  4.6  4.5   Hemoglobin 12.0 - 15.0 g/dL 12.9  12.2  12.8   Hematocrit 36.0 - 46.0 % 39.1  36.3  39.4   Platelets 150 - 400 K/uL 222  231  210     Lipid Panel Recent Labs     07/03/22 1106 01/08/23 1121  CHOL 113 154  TRIG 91.0 125.0  LDLCALC 44 54  VLDL 18.2 25.0  HDL 51.20 74.80  CHOLHDL 2 2   No results found for: "HGBA1C"  TSH Recent Labs    07/03/22 1106 01/08/23 1121  TSH 1.02 1.29   Radiology:     Cardiac Studies:   Coronary angiogram 11/28/2002: Proximal LAD stent: 3.0x18 Cypher.   Lower Extremity Arterial Duplex 02/05/2022: No hemodynamically significant stenoses are identified in the bilateral lower extremity arterial system.  This exam reveals normal perfusion of the right lower extremity (ABI 1.21) and normal perfusion of the left lower extremity (ABI 1.17) with normal triphasic waveform at the level of the ankles. Evaluate for pseudoclaudication.   PCV ECHOCARDIOGRAM COMPLETE 02/05/2022  Normal LV systolic function with EF 74%. Left ventricle cavity is normal in size. Normal left ventricular wall thickness. Normal global wall motion. Normal diastolic filling pattern, normal LAP. Trace aortic regurgitation. No significant valvular heart disease. Compared to study 04/30/2011 no significant change.   PCV MYOCARDIAL PERFUSION WO LEXISCAN 02/03/2022  Lexiscan (with Mod Bruce protocol) Nuclear stress test 02/03/2022: Normal ECG stress. The patient was injected with 0.4 mg of Intravenous Lexiscan over 15 sec. Additionally, patient exercised on a Modified Bruce protocol. Achieved 100% MPHR. Myocardial perfusion is normal. Overall LV systolic function is normal without regional wall motion abnormalities. Stress LV EF: 51%. No previous exam available for comparison. Low risk.    EKG:   EKG 02/23/2023: Normal sinus rhythm at the rate of 70 bpm, LAE, normal EKG.  Compared to 01/10/2022, left axis deviation not present.  Medications and allergies   Allergies  Allergen Reactions  Poison Ivy Extract Swelling   Plavix [Clopidogrel Bisulfate]    Other     seasonal   Medication list after today's encounter    Current Outpatient  Medications:    aspirin 81 MG tablet, Take 81 mg by mouth daily. , Disp: , Rfl:    atorvastatin (LIPITOR) 80 MG tablet, TAKE 1 TABLET(80 MG) BY MOUTH DAILY AT 6 PM, Disp: 90 tablet, Rfl: 1   benazepril (LOTENSIN) 10 MG tablet, TAKE 1 TABLET(10 MG) BY MOUTH DAILY, Disp: 90 tablet, Rfl: 1   calcium carbonate (TUMS - DOSED IN MG ELEMENTAL CALCIUM) 500 MG chewable tablet, Chew 1 tablet by mouth daily. Daily PRN, Disp: , Rfl:    cholecalciferol (VITAMIN D3) 25 MCG (1000 UNIT) tablet, Take 2,000 Units by mouth daily., Disp: , Rfl:    gabapentin (NEURONTIN) 100 MG capsule, Take 1 capsule (100 mg total) by mouth 2 (two) times daily as needed., Disp: 180 capsule, Rfl: 1   levothyroxine (SYNTHROID) 25 MCG tablet, TAKE 1 TABLET(25 MCG) BY MOUTH DAILY BEFORE BREAKFAST, Disp: 90 tablet, Rfl: 3   Magnesium 100 MG TABS, Take 100 mg by mouth daily., Disp: , Rfl:    polyethylene glycol (MIRALAX / GLYCOLAX) 17 g packet, Take 17 g by mouth daily as needed., Disp: , Rfl:    REVLIMID 10 MG capsule, Take 1 capsule (10 mg total) by mouth daily. Take 1 tablet daily for 21 days and then off for 7 days, Disp: 21 capsule, Rfl: 0   Zoledronic Acid (ZOMETA IV), Inject into the vein. Every 3 months, Disp: , Rfl:   Assessment     ICD-10-CM   1. Coronary artery disease involving native coronary artery of native heart without angina pectoris  I25.10 EKG 12-Lead    2. Primary hypertension  I10     3. Hypercholesteremia  E78.00        Medications Discontinued During This Encounter  Medication Reason   methylPREDNISolone (MEDROL DOSEPAK) 4 MG TBPK tablet    meclizine (ANTIVERT) 25 MG tablet Patient Preference   loperamide (IMODIUM) 2 MG capsule Patient Preference    No orders of the defined types were placed in this encounter.  Orders Placed This Encounter  Procedures   EKG 12-Lead   Recommendations:   Doris Wilkerson is a 78 y.o.  Caucasian female with coronary artery disease and stable angina pectoris,  underwent proximal LAD stenting with Cypher stent in 2004.  Past medical history significant for hypertension, hyperlipidemia. 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  1. Coronary artery disease involving native coronary artery of native heart without angina pectoris Patient is here on an annual visit, from cardiac standpoint she is remained stable, EKG is normal, previously she has had left axis deviation.  Otherwise normal significant changes.  No change in physical exam.  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  - EKG 12-Lead  2. Primary hypertension Blood pressure Controlled.  Renal function is normal.  External labs reviewed.  3. Hypercholesteremia Patient is tolerating high intensity statin at 80 mg Lipitor daily, she has not had any adverse effects.  Continue same for now.  Otherwise stable from cardiac standpoint, I will see her back on annual basis.     Doris Prows, MD, [facility] 02/23/2023, 11:30 AM Office: [phone]

## 2023-02-24 ENCOUNTER — Inpatient Hospital Stay: Payer: Medicare Other | Attending: Physician Assistant

## 2023-02-24 ENCOUNTER — Inpatient Hospital Stay (HOSPITAL_BASED_OUTPATIENT_CLINIC_OR_DEPARTMENT_OTHER): Payer: Medicare Other | Admitting: Hematology and Oncology

## 2023-02-24 ENCOUNTER — Inpatient Hospital Stay: Payer: Medicare Other

## 2023-02-24 ENCOUNTER — Encounter: Payer: Self-pay | Admitting: Hematology and Oncology

## 2023-02-24 ENCOUNTER — Other Ambulatory Visit: Payer: Self-pay

## 2023-02-24 VITALS — BP 116/61 | HR 72 | Temp 98.4°F | Resp 17 | Wt 135.4 lb

## 2023-02-24 DIAGNOSIS — J45909 Unspecified asthma, uncomplicated: Secondary | ICD-10-CM | POA: Insufficient documentation

## 2023-02-24 DIAGNOSIS — C9001 Multiple myeloma in remission: Secondary | ICD-10-CM | POA: Diagnosis not present

## 2023-02-24 DIAGNOSIS — Z79899 Other long term (current) drug therapy: Secondary | ICD-10-CM | POA: Diagnosis not present

## 2023-02-24 DIAGNOSIS — E785 Hyperlipidemia, unspecified: Secondary | ICD-10-CM | POA: Insufficient documentation

## 2023-02-24 DIAGNOSIS — Z7982 Long term (current) use of aspirin: Secondary | ICD-10-CM | POA: Insufficient documentation

## 2023-02-24 DIAGNOSIS — I10 Essential (primary) hypertension: Secondary | ICD-10-CM | POA: Insufficient documentation

## 2023-02-24 DIAGNOSIS — C9000 Multiple myeloma not having achieved remission: Secondary | ICD-10-CM | POA: Diagnosis not present

## 2023-02-24 DIAGNOSIS — Z7961 Long term (current) use of immunomodulator: Secondary | ICD-10-CM | POA: Diagnosis not present

## 2023-02-24 DIAGNOSIS — E079 Disorder of thyroid, unspecified: Secondary | ICD-10-CM | POA: Diagnosis not present

## 2023-02-24 DIAGNOSIS — K219 Gastro-esophageal reflux disease without esophagitis: Secondary | ICD-10-CM | POA: Insufficient documentation

## 2023-02-24 LAB — CBC WITH DIFFERENTIAL (CANCER CENTER ONLY)
Abs Immature Granulocytes: 0.01 10*3/uL (ref 0.00–0.07)
Basophils Absolute: 0.1 10*3/uL (ref 0.0–0.1)
Basophils Relative: 2 %
Eosinophils Absolute: 0.6 10*3/uL — ABNORMAL HIGH (ref 0.0–0.5)
Eosinophils Relative: 10 %
HCT: 35.5 % — ABNORMAL LOW (ref 36.0–46.0)
Hemoglobin: 12.1 g/dL (ref 12.0–15.0)
Immature Granulocytes: 0 %
Lymphocytes Relative: 22 %
Lymphs Abs: 1.3 10*3/uL (ref 0.7–4.0)
MCH: 30 pg (ref 26.0–34.0)
MCHC: 34.1 g/dL (ref 30.0–36.0)
MCV: 87.9 fL (ref 80.0–100.0)
Monocytes Absolute: 0.8 10*3/uL (ref 0.1–1.0)
Monocytes Relative: 14 %
Neutro Abs: 3 10*3/uL (ref 1.7–7.7)
Neutrophils Relative %: 52 %
Platelet Count: 227 10*3/uL (ref 150–400)
RBC: 4.04 MIL/uL (ref 3.87–5.11)
RDW: 14.3 % (ref 11.5–15.5)
WBC Count: 5.7 10*3/uL (ref 4.0–10.5)
nRBC: 0 % (ref 0.0–0.2)

## 2023-02-24 LAB — CMP (CANCER CENTER ONLY)
ALT: 35 U/L (ref 0–44)
AST: 27 U/L (ref 15–41)
Albumin: 4.3 g/dL (ref 3.5–5.0)
Alkaline Phosphatase: 48 U/L (ref 38–126)
Anion gap: 8 (ref 5–15)
BUN: 13 mg/dL (ref 8–23)
CO2: 23 mmol/L (ref 22–32)
Calcium: 9.7 mg/dL (ref 8.9–10.3)
Chloride: 106 mmol/L (ref 98–111)
Creatinine: 0.74 mg/dL (ref 0.44–1.00)
GFR, Estimated: >60 mL/min (ref >60)
Glucose, Bld: 100 mg/dL — ABNORMAL HIGH (ref 70–99)
Potassium: 3.9 mmol/L (ref 3.5–5.1)
Sodium: 137 mmol/L (ref 135–145)
Total Bilirubin: 0.7 mg/dL (ref 0.3–1.2)
Total Protein: 6.8 g/dL (ref 6.5–8.1)

## 2023-02-24 LAB — LACTATE DEHYDROGENASE: LDH: 140 U/L (ref 98–192)

## 2023-02-24 MED ORDER — ZOLEDRONIC ACID 4 MG/100ML IV SOLN
4.0000 mg | Freq: Once | INTRAVENOUS | Status: AC
  Administered 2023-02-24: 4 mg via INTRAVENOUS
  Filled 2023-02-24: qty 100

## 2023-02-24 MED ORDER — SODIUM CHLORIDE 0.9 % IV SOLN: INTRAVENOUS | Status: DC

## 2023-02-24 NOTE — Patient Instructions (Signed)

## 2023-02-25 ENCOUNTER — Telehealth: Payer: Self-pay | Admitting: Hematology and Oncology

## 2023-02-25 LAB — KAPPA/LAMBDA LIGHT CHAINS
Kappa free light chain: 25.3 mg/L — ABNORMAL HIGH (ref 3.3–19.4)
Kappa, lambda light chain ratio: 0.19 — ABNORMAL LOW (ref 0.26–1.65)
Lambda free light chains: 133.6 mg/L — ABNORMAL HIGH (ref 5.7–26.3)

## 2023-02-25 LAB — BETA 2 MICROGLOBULIN, SERUM: Beta-2 Microglobulin: 1.5 mg/L (ref 0.6–2.4)

## 2023-02-25 NOTE — Telephone Encounter (Signed)
Called patient per 4/2 los notes to schedule f/u. Patient scheduled and notified.

## 2023-02-27 ENCOUNTER — Ambulatory Visit: Payer: Medicare Other | Admitting: Hematology and Oncology

## 2023-02-27 ENCOUNTER — Other Ambulatory Visit: Payer: Medicare Other

## 2023-02-27 ENCOUNTER — Ambulatory Visit: Payer: Medicare Other

## 2023-02-27 LAB — MULTIPLE MYELOMA PANEL, SERUM
Albumin SerPl Elph-Mcnc: 4 g/dL (ref 2.9–4.4)
Albumin/Glob SerPl: 1.9 — ABNORMAL HIGH (ref 0.7–1.7)
Alpha 1: 0.2 g/dL (ref 0.0–0.4)
Alpha2 Glob SerPl Elph-Mcnc: 0.5 g/dL (ref 0.4–1.0)
B-Globulin SerPl Elph-Mcnc: 0.7 g/dL (ref 0.7–1.3)
Gamma Glob SerPl Elph-Mcnc: 0.7 g/dL (ref 0.4–1.8)
Globulin, Total: 2.2 g/dL (ref 2.2–3.9)
IgA: 179 mg/dL (ref 64–422)
IgG (Immunoglobin G), Serum: 731 mg/dL (ref 586–1602)
IgM (Immunoglobulin M), Srm: 24 mg/dL — ABNORMAL LOW (ref 26–217)
Total Protein ELP: 6.2 g/dL (ref 6.0–8.5)

## 2023-02-27 LAB — UPEP/UIFE/LIGHT CHAINS/TP, 24-HR UR
% BETA, Urine: 0 %
ALPHA 1 URINE: 0 %
Albumin, U: 100 %
Alpha 2, Urine: 0 %
Free Kappa Lt Chains,Ur: 31.63 mg/L (ref 1.17–86.46)
Free Kappa/Lambda Ratio: 6.92 (ref 1.83–14.26)
Free Lambda Lt Chains,Ur: 4.57 mg/L (ref 0.27–15.21)
GAMMA GLOBULIN URINE: 0 %
Total Protein, Urine-Ur/day: 119 mg/24 hr (ref 30–150)
Total Protein, Urine: 5.9 mg/dL
Total Volume: 2025

## 2023-03-02 ENCOUNTER — Other Ambulatory Visit: Payer: Self-pay | Admitting: *Deleted

## 2023-03-02 MED ORDER — REVLIMID 10 MG PO CAPS: 10.0000 mg | ORAL_CAPSULE | Freq: Every day | ORAL | 0 refills | Status: DC

## 2023-03-03 ENCOUNTER — Other Ambulatory Visit (HOSPITAL_COMMUNITY): Payer: Self-pay

## 2023-03-05 ENCOUNTER — Telehealth: Payer: Self-pay | Admitting: *Deleted

## 2023-03-05 ENCOUNTER — Other Ambulatory Visit (HOSPITAL_COMMUNITY): Payer: Self-pay

## 2023-03-05 ENCOUNTER — Other Ambulatory Visit: Payer: Self-pay | Admitting: *Deleted

## 2023-03-05 ENCOUNTER — Telehealth: Payer: Self-pay | Admitting: Pharmacist

## 2023-03-05 ENCOUNTER — Telehealth: Payer: Self-pay | Admitting: Pharmacy Technician

## 2023-03-05 DIAGNOSIS — C9000 Multiple myeloma not having achieved remission: Secondary | ICD-10-CM

## 2023-03-05 MED ORDER — POMALIDOMIDE 2 MG PO CAPS: 2.0000 mg | ORAL_CAPSULE | Freq: Every day | ORAL | 0 refills | Status: DC

## 2023-03-05 NOTE — Telephone Encounter (Signed)
Oral Oncology Patient Advocate Encounter  Prior Authorization for Pomalyst has been approved.    PA#  [id number] Effective dates: 03/05/23 through 11/24/23  Patients co-pay is $0.  Patient may fill at [facility].  Jinger Neighbors, CPhT-Adv Oncology Pharmacy Patient Advocate [facility] Direct Number: [phone]  Fax: [phone]

## 2023-03-05 NOTE — Telephone Encounter (Signed)
Oral Oncology Patient Advocate Encounter   Received notification that prior authorization for Pomalyst is required.   PA submitted on 03/05/23 Key [id number] Status is pending     Jinger Neighbors, CPhT-Adv Oncology Pharmacy Patient Advocate [facility] Direct Number: [phone]  Fax: [phone]

## 2023-03-05 NOTE — Telephone Encounter (Signed)
TCT patient regarding a change in her treatment plan. Dr. Leonides Schanz had spoken to her to her abut this at her last visit. Spoke with Vernona Rieger. Advised that Dr. Leonides Schanz is now ready to adjust her treatment plan . He will be stopping the Revlimid and we will be ordering Pomalyst 2mg . She will also start Darzalex SQ. The plan is to start both on the same day, likely at her next appt on May 3Pt voiced understanding. Oral chemo department aware of change in medication as well as [facility] and [facility]

## 2023-03-05 NOTE — Telephone Encounter (Signed)
Oral Oncology Pharmacist Encounter  Received new prescription for Pomalyst (pomalidomide) for the treatment of R/R multiple myeloma in conjunction with daratumumab and dexamethasone, planned duration until disease progression or unacceptable drug toxicity.  CBC w/ Diff and CMP from 02/24/23 assessed, no relevant lab abnormalities requiring baseline dose adjustment required at this time. Prescription dose and frequency assessed for appropriateness.  Current medication list in Epic reviewed, no relevant/significant DDIs with Pomalyst identified.  Evaluated chart and no patient barriers to medication adherence noted.   Prescription will need to be e-scribed to [facility] for dispensing.   Oral Oncology Clinic will continue to follow for insurance authorization, copayment issues, initial counseling and start date.  Doris Wilkerson, PharmD, BCPS, BCOP Hematology/Oncology Clinical Pharmacist [facility] Oral Chemotherapy Navigation Clinics [phone] 03/05/2023 1:18 PM

## 2023-03-09 ENCOUNTER — Other Ambulatory Visit (HOSPITAL_COMMUNITY): Payer: Self-pay

## 2023-03-18 ENCOUNTER — Encounter: Payer: Self-pay | Admitting: Hematology and Oncology

## 2023-03-19 DIAGNOSIS — Z23 Encounter for immunization: Secondary | ICD-10-CM | POA: Diagnosis not present

## 2023-03-22 ENCOUNTER — Encounter: Payer: Self-pay | Admitting: Hematology and Oncology

## 2023-03-22 ENCOUNTER — Other Ambulatory Visit: Payer: Self-pay | Admitting: Hematology and Oncology

## 2023-03-22 DIAGNOSIS — C9000 Multiple myeloma not having achieved remission: Secondary | ICD-10-CM

## 2023-03-22 NOTE — Progress Notes (Signed)
DISCONTINUE ON PATHWAY REGIMEN - Multiple Myeloma and Other Plasma Cell Dyscrasias     A cycle is every 21 days:     Bortezomib      Lenalidomide      Dexamethasone   **Always confirm dose/schedule in your pharmacy ordering system**  REASON: Disease Progression PRIOR TREATMENT: [id number]: VRd (Bortezomib 1.3 mg/m2 SUBQ D1, 8, 15 + Lenalidomide 25 mg + Dexamethasone 40 mg) q21 Days x 8 Cycles TREATMENT RESPONSE: Complete Response (CR)  START ON PATHWAY REGIMEN - Multiple Myeloma and Other Plasma Cell Dyscrasias     Cycles 1 and 2: A cycle is every 28 days:     Lenalidomide      Dexamethasone      Daratumumab and hyaluronidase-fihj    Cycles 3 through 6: A cycle is every 28 days:     Lenalidomide      Dexamethasone      Daratumumab and hyaluronidase-fihj    Cycles 7 and beyond: A cycle is every 28 days:     Lenalidomide      Dexamethasone      Daratumumab and hyaluronidase-fihj   **Always confirm dose/schedule in your pharmacy ordering system**  Patient Characteristics: Multiple Myeloma, Newly Diagnosed, Transplant Ineligible or Refused, Standard Risk Disease Classification: Multiple Myeloma Therapeutic Status: Newly Diagnosed R2-ISS Staging: II Is Patient Eligible for Transplant<= Transplant Ineligible or Refused Risk Status: Standard Risk Intent of Therapy: Non-Curative / Palliative Intent, Discussed with Patient

## 2023-03-23 ENCOUNTER — Other Ambulatory Visit: Payer: Self-pay

## 2023-03-23 ENCOUNTER — Telehealth: Payer: Self-pay | Admitting: Physician Assistant

## 2023-03-23 NOTE — Telephone Encounter (Signed)
Reached out to patient to schedule per WQ, patient aware of date and time of appointments.

## 2023-03-26 NOTE — Telephone Encounter (Signed)
Oral Chemotherapy Pharmacist Encounter  I spoke with patient for overview of: Pomalyst (pomalidomide) for the treatment of relapsed multiple myeloma in conjunction with daratumumab and dexamethasone, planned duration until disease progression or unacceptable toxicity.   Counseled patient on administration, dosing, side effects, monitoring, drug-food interactions, safe handling, storage, and disposal.  Patient will take Pomalyst 2mg  capsules, 1 capsule by mouth once daily, without regard to food, with a full glass of water.  Pomalyst will be given 21 days on, 7 days off, repeat every 28 days.  Pomalyst start date: tentatively 03/27/23 PM (patient has OV 03/27/23 to confirm)  Adverse effects of Pomalyst include but are not limited to: nausea, GI upset, rash, fatigue, peripheral edema, and decreased blood counts.   VTE PPX: Patient endorses continuing to take aspirin 81 mg daily VZV PPX: Patient still has some acyclovir at home from previous regimen that she will bring with her to clinic tomorrow (03/27/23) and will request new Rx at that time.   Reviewed with patient importance of keeping a medication schedule and plan for any missed doses. No barriers to medication adherence identified.  Medication reconciliation performed and medication/allergy list updated.   Insurance authorization for [employer] has been obtained.  Pomalyst prescription is being dispensed from [facility] as it is a limited distribution medication.  All questions answered.  Doris Wilkerson voiced understanding and appreciation.   Medication education handout placed in mail for patient. Patient knows to call the office with questions or concerns. Oral Chemotherapy Clinic phone number provided to patient.   Lenord Carbo, PharmD, BCPS, BCOP Hematology/Oncology Clinical Pharmacist [facility] Oral Chemotherapy Navigation Clinics [phone] 03/26/2023 10:20 AM

## 2023-03-27 ENCOUNTER — Inpatient Hospital Stay: Payer: Medicare Other

## 2023-03-27 ENCOUNTER — Inpatient Hospital Stay (HOSPITAL_BASED_OUTPATIENT_CLINIC_OR_DEPARTMENT_OTHER): Payer: Medicare Other | Admitting: Physician Assistant

## 2023-03-27 ENCOUNTER — Other Ambulatory Visit: Payer: Self-pay | Admitting: Physician Assistant

## 2023-03-27 ENCOUNTER — Inpatient Hospital Stay: Payer: Medicare Other | Attending: Physician Assistant

## 2023-03-27 VITALS — BP 119/66 | HR 62 | Temp 98.4°F | Resp 12 | Wt 133.8 lb

## 2023-03-27 DIAGNOSIS — M549 Dorsalgia, unspecified: Secondary | ICD-10-CM | POA: Insufficient documentation

## 2023-03-27 DIAGNOSIS — E162 Hypoglycemia, unspecified: Secondary | ICD-10-CM | POA: Diagnosis not present

## 2023-03-27 DIAGNOSIS — C9000 Multiple myeloma not having achieved remission: Secondary | ICD-10-CM

## 2023-03-27 DIAGNOSIS — Z7961 Long term (current) use of immunomodulator: Secondary | ICD-10-CM | POA: Insufficient documentation

## 2023-03-27 DIAGNOSIS — Z7982 Long term (current) use of aspirin: Secondary | ICD-10-CM | POA: Insufficient documentation

## 2023-03-27 DIAGNOSIS — Z5112 Encounter for antineoplastic immunotherapy: Secondary | ICD-10-CM | POA: Diagnosis not present

## 2023-03-27 DIAGNOSIS — Z79899 Other long term (current) drug therapy: Secondary | ICD-10-CM | POA: Diagnosis not present

## 2023-03-27 DIAGNOSIS — C9001 Multiple myeloma in remission: Secondary | ICD-10-CM

## 2023-03-27 DIAGNOSIS — Z79624 Long term (current) use of inhibitors of nucleotide synthesis: Secondary | ICD-10-CM | POA: Insufficient documentation

## 2023-03-27 LAB — CBC WITH DIFFERENTIAL (CANCER CENTER ONLY)
Abs Immature Granulocytes: 0 10*3/uL (ref 0.00–0.07)
Basophils Absolute: 0.1 10*3/uL (ref 0.0–0.1)
Basophils Relative: 1 %
Eosinophils Absolute: 0.4 10*3/uL (ref 0.0–0.5)
Eosinophils Relative: 7 %
HCT: 38.4 % (ref 36.0–46.0)
Hemoglobin: 12.6 g/dL (ref 12.0–15.0)
Immature Granulocytes: 0 %
Lymphocytes Relative: 24 %
Lymphs Abs: 1.4 10*3/uL (ref 0.7–4.0)
MCH: 29.4 pg (ref 26.0–34.0)
MCHC: 32.8 g/dL (ref 30.0–36.0)
MCV: 89.7 fL (ref 80.0–100.0)
Monocytes Absolute: 0.6 10*3/uL (ref 0.1–1.0)
Monocytes Relative: 10 %
Neutro Abs: 3.4 10*3/uL (ref 1.7–7.7)
Neutrophils Relative %: 58 %
Platelet Count: 232 10*3/uL (ref 150–400)
RBC: 4.28 MIL/uL (ref 3.87–5.11)
RDW: 14.1 % (ref 11.5–15.5)
WBC Count: 5.8 10*3/uL (ref 4.0–10.5)
nRBC: 0 % (ref 0.0–0.2)

## 2023-03-27 LAB — TYPE AND SCREEN
ABO/RH(D): O POS
Antibody Screen: NEGATIVE

## 2023-03-27 LAB — PRETREATMENT RBC PHENOTYPE: DAT, IgG: NEGATIVE

## 2023-03-27 LAB — CMP (CANCER CENTER ONLY)
ALT: 26 U/L (ref 0–44)
AST: 25 U/L (ref 15–41)
Albumin: 4.5 g/dL (ref 3.5–5.0)
Alkaline Phosphatase: 54 U/L (ref 38–126)
Anion gap: 6 (ref 5–15)
BUN: 13 mg/dL (ref 8–23)
CO2: 23 mmol/L (ref 22–32)
Calcium: 9.6 mg/dL (ref 8.9–10.3)
Chloride: 110 mmol/L (ref 98–111)
Creatinine: 0.7 mg/dL (ref 0.44–1.00)
GFR, Estimated: >60 mL/min (ref >60)
Glucose, Bld: 104 mg/dL — ABNORMAL HIGH (ref 70–99)
Potassium: 4.1 mmol/L (ref 3.5–5.1)
Sodium: 139 mmol/L (ref 135–145)
Total Bilirubin: 0.7 mg/dL (ref 0.3–1.2)
Total Protein: 7 g/dL (ref 6.5–8.1)

## 2023-03-27 LAB — LACTATE DEHYDROGENASE: LDH: 153 U/L (ref 98–192)

## 2023-03-27 MED ORDER — DARATUMUMAB-HYALURONIDASE-FIHJ 1800-30000 MG-UT/15ML ~~LOC~~ SOLN
1800.0000 mg | Freq: Once | SUBCUTANEOUS | Status: AC
  Administered 2023-03-27: 1800 mg via SUBCUTANEOUS
  Filled 2023-03-27: qty 15

## 2023-03-27 MED ORDER — ACETAMINOPHEN 325 MG PO TABS
650.0000 mg | ORAL_TABLET | Freq: Once | ORAL | Status: AC
  Administered 2023-03-27: 650 mg via ORAL
  Filled 2023-03-27: qty 2

## 2023-03-27 MED ORDER — DIPHENHYDRAMINE HCL 25 MG PO CAPS
50.0000 mg | ORAL_CAPSULE | Freq: Once | ORAL | Status: AC
  Administered 2023-03-27: 50 mg via ORAL
  Filled 2023-03-27: qty 2

## 2023-03-27 MED ORDER — DEXAMETHASONE 4 MG PO TABS
20.0000 mg | ORAL_TABLET | Freq: Once | ORAL | Status: AC
  Administered 2023-03-27: 20 mg via ORAL
  Filled 2023-03-27: qty 5

## 2023-03-27 MED ORDER — MONTELUKAST SODIUM 10 MG PO TABS
10.0000 mg | ORAL_TABLET | Freq: Once | ORAL | Status: AC
  Administered 2023-03-27: 10 mg via ORAL
  Filled 2023-03-27: qty 1

## 2023-03-27 MED ORDER — ACYCLOVIR 400 MG PO TABS: 400.0000 mg | ORAL_TABLET | Freq: Two times a day (BID) | ORAL | 3 refills | Status: DC

## 2023-03-27 NOTE — Progress Notes (Signed)
Pt observed for 2 hours post first time Darzalex Faspro injection. Pt tolerated trtmt well w/out incident. Pt had c/o of redness at injection site approximately 30 minutes into observation period. Upon assessment this RN found redness present, but no swelling. Pt denied feeling SOB, chest pain, lightheaded or dizzy. At time of discharge no redness present at injection site. VSS at discharge.  Ambulatory to lobby.

## 2023-03-27 NOTE — Progress Notes (Unsigned)
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 Concerning for multiple myeloma vs metastatic disease 12/31/2020: establish care with Dr. Leonides Schanz. UPEP showed marked Bence Jones protein, findings concerning for multiple myleoma 01/21/2021: bone marrow biopsy confirms multiple myeloma with atypical plasma cells representing 28% of all cells in the aspirate  02/01/2021: Cycle 1 Day 1 of VRd chemotherapy 02/22/2021: Cycle 2 Day 1 of VRd chemotherapy 03/15/2021: Cycle 3 Day 1 of VRd chemotherapy 04/05/2021: Cycle 4 Day 1 of VRd chemotherapy 04/26/2021: Cycle 5 Day 1 of VRd chemotherapy 05/17/2021: Cycle 6 Day 1 of VRd chemotherapy 06/07/2021:  Cycle 7 Day 1 of VRd chemotherapy 06/28/2021: Cycle 8 Day 1 of VRd chemotherapy 07/19/2021: start maintenance revlimid 10mg  PO daily.  03/26/2022: Cycle 1 Day 1 of Dara/Pom/Dex  Interval History:  Doris Wilkerson 77 y.o. female with medical history significant for lambda light chain multiple myeloma who presents for a follow up visit. She was last seen by Dr. Leonides Schanz on 02/24/2023. In the interim, due to rising lambda light chains, it was recommended to switch therapy to Dara/Pom/Dex. She is due to start cycle 1, day 1 today.   On exam today Doris Wilkerson reports that overall she is doing well. Her energy levels are stable and appetite is unchanged. 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 She denies nausea, vomiting or abdominal pain. Her bowel habits are regular without recurrent episodes of diarrhea or constipation. She denies easy bruising or signs of  bleeding. She denies fevers, chills, night sweats, shortness of breath, chest pain or cough. She has no other complaints. A full 10 point ROS is listed below.   MEDICAL HISTORY:  Past Medical History:  Diagnosis Date   Asthma    Cataract    GERD (gastroesophageal reflux disease)    Hyperlipidemia    Hypertension    Thyroid disease     SURGICAL HISTORY: Past Surgical History:  Procedure Laterality Date   Cataract Surgery     CORONARY ANGIOPLASTY WITH STENT PLACEMENT     EYE SURGERY     ORTHOPEDIC SURGERY  2012   TONSILLECTOMY  1951    SOCIAL HISTORY: Social History   Socioeconomic History   Marital status: Married    Spouse name: Not on file   Number of children: 0   Years of education: Not on file   Highest education level: Not on file  Occupational History   Occupation: unemployed  Tobacco Use   Smoking status: Never   Smokeless tobacco: Never  Vaping Use   Vaping Use: Never used  Substance and Sexual Activity   Alcohol use: Yes    Alcohol/week: 1.0 standard drink of alcohol    Types: 1 Glasses of wine per week    Comment: occassionally   Drug use: No   Sexual activity: Not Currently  Other Topics Concern   Not on file  Social History Narrative   Married. Education: college. Pt does exercise-   Social Determinants of Health   Financial Resource Strain: Low Risk  (04/03/2022)   Overall Financial Resource Strain (CARDIA)    Difficulty of Paying Living Expenses: Not hard at all  Food Insecurity: No Food Insecurity (04/03/2022)   Hunger Vital Sign    Worried About Running Out of Food in the Last Year: Never true    Ran Out of Food in the Last Year: Never true  Transportation Needs: No Transportation Needs (04/03/2022)   PRAPARE - Administrator, Civil Service (Medical): No    Lack of Transportation (Non-Medical): No  Physical Activity: Sufficiently Active (04/03/2022)   Exercise Vital Sign    Days of Exercise per Week: 3 days    Minutes of  Exercise per Session: 50 min  Stress: No Stress Concern Present (04/03/2022)   [employer] of Occupational Health - Occupational Stress Questionnaire    Feeling of Stress : Not at all  Social Connections: Moderately Integrated (04/03/2022)   Social Connection and Isolation Panel [NHANES]    Frequency of Communication with Friends and Family: More than three times a week    Frequency of Social Gatherings with Friends and Family: More than three times a week    Attends Religious Services: Never    Database administrator or Organizations: Yes    Attends Engineer, structural: More than 4 times per year    Marital Status: Married  Catering manager Violence: Not At Risk (04/03/2022)   Humiliation, Afraid, Rape, and Kick questionnaire    Fear of Current or Ex-Partner: No    Emotionally Abused: No    Physically Abused: No    Sexually Abused: No    FAMILY HISTORY: Family History  Problem Relation Age of Onset   Cancer Father    Heart disease Brother    Dementia Brother    Leukemia Brother    Breast cancer Neg Hx    Colon cancer Neg Hx    Esophageal cancer Neg Hx    Pancreatic cancer Neg Hx    Rectal cancer Neg Hx    Stomach cancer Neg Hx     ALLERGIES:  is allergic to poison ivy extract, plavix [clopidogrel bisulfate], and other.  MEDICATIONS:  Current Outpatient Medications  Medication Sig Dispense Refill   acyclovir (ZOVIRAX) 400 MG tablet Take 1 tablet (400 mg total) by mouth 2 (two) times daily. 60 tablet 3   aspirin 81 MG tablet Take 81 mg by mouth daily.      atorvastatin (LIPITOR) 80 MG tablet TAKE 1 TABLET(80 MG) BY MOUTH DAILY AT 6 PM 90 tablet 1   benazepril (LOTENSIN) 10 MG tablet TAKE 1 TABLET(10 MG) BY MOUTH DAILY 90 tablet 1   calcium carbonate (TUMS - DOSED IN MG ELEMENTAL CALCIUM) 500 MG chewable tablet Chew 1 tablet by mouth daily. Daily PRN     cholecalciferol (VITAMIN D3) 25 MCG (1000 UNIT) tablet Take 2,000 Units by mouth daily.     gabapentin  (NEURONTIN) 100 MG capsule Take 1 capsule (100 mg total) by mouth 2 (two) times daily as needed. 180 capsule 1   levothyroxine (SYNTHROID) 25 MCG tablet TAKE 1 TABLET(25 MCG) BY MOUTH DAILY BEFORE BREAKFAST 90 tablet 3   Magnesium 100 MG TABS Take 100 mg by mouth daily.     polyethylene glycol (MIRALAX / GLYCOLAX) 17 g packet Take 17 g by mouth daily as needed.     pomalidomide (POMALYST) 2 MG capsule Take 1 capsule (2 mg total) by mouth daily. Take for 21 days, then none for 7 days. Repeat every 28 days. [employer] Auth # [id number] Obtained 03/05/23 21 capsule 0   Zoledronic Acid (ZOMETA IV) Inject into the vein.  Every 3 months     No current facility-administered medications for this visit.    REVIEW OF SYSTEMS:   Constitutional: ( - ) fevers, ( - )  chills , ( - ) night sweats Eyes: ( - ) blurriness of vision, ( - ) double vision, ( - ) watery eyes Ears, nose, mouth, throat, and face: ( - ) mucositis, ( - ) sore throat Respiratory: ( - ) cough, ( - ) dyspnea, ( - ) wheezes Cardiovascular: ( - ) palpitation, ( - ) chest discomfort, ( - ) lower extremity swelling Gastrointestinal:  ( - ) nausea, ( - ) heartburn, ( - ) change in bowel habits Skin: ( - ) abnormal skin rashes Lymphatics: ( - ) new lymphadenopathy, ( - ) easy bruising Neurological: ( +) numbness, ( - ) tingling, ( - ) new weaknesses Behavioral/Psych: ( - ) mood change, ( - ) new changes  All other systems were reviewed with the patient and are negative.  PHYSICAL EXAMINATION: ECOG PERFORMANCE STATUS: 0 - Asymptomatic  There were no vitals filed for this visit. There were no vitals filed for this visit.   Day 1, Cycle 1 03/27/23  Weight 133 lb 12 oz (60.7 kg)  Temp 98.4 F (36.9 C)  Temp src Oral  Pulse 62  Resp 12  BP 119/66   GENERAL: well appearing elderly Caucasian female in NAD  SKIN: skin color, texture, turgor are normal, no rashes or significant lesions EYES: conjunctiva are pink and non-injected, sclera  clear LUNGS: clear to auscultation and percussion with normal breathing effort HEART: regular rate & rhythm and no murmurs and trace lower extremity edema Musculoskeletal: no cyanosis of digits and no clubbing  PSYCH: alert & oriented x 3, fluent speech NEURO: no focal motor/sensory deficits  LABORATORY DATA:  I have reviewed the data as listed    Latest Ref Rng & Units 03/27/2023   10:10 AM 02/24/2023    9:31 AM 01/30/2023    9:57 AM  CBC  WBC 4.0 - 10.5 K/uL 5.8  5.7  4.9   Hemoglobin 12.0 - 15.0 g/dL 16.1  09.6  04.5   Hematocrit 36.0 - 46.0 % 38.4  35.5  39.1   Platelets 150 - 400 K/uL 232  227  222        Latest Ref Rng & Units 03/27/2023   10:10 AM 02/24/2023    9:31 AM 01/30/2023    9:57 AM  CMP  Glucose 70 - 99 mg/dL 409  811  85   BUN 8 - 23 mg/dL 13  13  17    Creatinine 0.44 - 1.00 mg/dL 9.14  7.82  9.56   Sodium 135 - 145 mmol/L 139  137  138   Potassium 3.5 - 5.1 mmol/L 4.1  3.9  4.2   Chloride 98 - 111 mmol/L 110  106  104   CO2 22 - 32 mmol/L 23  23  29    Calcium 8.9 - 10.3 mg/dL 9.6  9.7  9.9   Total Protein 6.5 - 8.1 g/dL 7.0  6.8  7.3   Total Bilirubin 0.3 - 1.2 mg/dL 0.7  0.7  0.9   Alkaline Phos 38 - 126 U/L 54  48  54   AST 15 - 41 U/L 25  27  24    ALT 0 - 44 U/L 26  35  28     Lab Results  Component Value Date   MPROTEIN Not Observed 02/24/2023   MPROTEIN  Not Observed 01/30/2023   MPROTEIN Not Observed 12/01/2022   Lab Results  Component Value Date   KPAFRELGTCHN 25.3 (H) 02/24/2023   KPAFRELGTCHN 29.4 (H) 01/30/2023   KPAFRELGTCHN 27.0 (H) 12/01/2022   LAMBDASER 133.6 (H) 02/24/2023   LAMBDASER 119.3 (H) 01/30/2023   LAMBDASER 70.4 (H) 12/01/2022   KAPLAMBRATIO 6.92 02/24/2023   KAPLAMBRATIO 0.19 (L) 02/24/2023   KAPLAMBRATIO 0.25 (L) 01/30/2023    RADIOGRAPHIC STUDIES: No results found.  ASSESSMENT & PLAN Annalisa Tresner 77 y.o. female with medical history significant for lambda light chain multiple myeloma who presents for a follow up  visit. Doris Wilkerson is not a candidate for bone marrow transplant based on her advanced age.     # Lambda Light Chain Multiple Myeloma--VGPR on maintenance --diagnosis confirmed with bone marrow biopsy and urine protein analysis.   --patient has good functional status and would be an excellent candidate for VRd chemotherapy. I do not believe she would be a bone marrow transplant candidate based on her age.  --Received VRD chemotherapy from 02/01/2021-07/12/2021 then started maintenance Revlimid on 07/19/2021.  --Due to rising lambda light chain, recommended to switch to Daratumumab/Pomalyst/Dexamethasone starting today, 03/27/2023. Plan: --Due to start Cycle 1, Day 1 of Dara/Pom/Dex today --Labs today show white blood cell count 5.8, hemoglobin 12.6, MCV 89.7, and platelets of 232.  Creatinine and LFTs are within normal limits. --Return to clinic weekly for labs/treatment and follow up visit in 2 weeks  #Back Pain -- Currently well controlled on gabapentin and Tylenol --CT thoracic and lumbar spine showed no lytic lesions or sequelae of multiple myeloma --MRI thoracic and lumbar spine from 07/07/2022 for baseline imaging. Findings showed multiple bone lesions involving posterior T9 and T8, lumbar spine and upper sacrum consistent with multiple myeloma. No extension into the canal. No pathologic fracture.  --Patient plan to follow up with Dr .Donalee Citrin, neurosugery --Continue to monitor  #Supportive Care --chemotherapy education complete  --zofran 8mg  q8H PRN and compazine 10mg  PO q6H for nausea -- acyclovir 400mg  PO BID for VCZ prophylaxis --zometa therapy last received on 02/24/2023. Next due in July 2024. -- no prescription pain medication required at this time.   Orders Placed This Encounter  Procedures   CMP (Cancer Center only)    Standing Status:   Future    Standing Expiration Date:   04/23/2024   Lactate dehydrogenase (LDH)    Standing Status:   Future    Standing Expiration Date:    04/23/2024   CBC with Differential (Cancer Center Only)    Standing Status:   Future    Standing Expiration Date:   04/23/2024   CMP (Cancer Center only)    Standing Status:   Future    Standing Expiration Date:   04/30/2024   Lactate dehydrogenase (LDH)    Standing Status:   Future    Standing Expiration Date:   04/30/2024   CBC with Differential (Cancer Center Only)    Standing Status:   Future    Standing Expiration Date:   04/30/2024   CMP (Cancer Center only)    Standing Status:   Future    Standing Expiration Date:   05/07/2024   Lactate dehydrogenase (LDH)    Standing Status:   Future    Standing Expiration Date:   05/07/2024   CBC with Differential (Cancer Center Only)    Standing Status:   Future    Standing Expiration Date:   05/07/2024   CMP (Cancer Center only)  Standing Status:   Future    Standing Expiration Date:   05/14/2024   Lactate dehydrogenase (LDH)    Standing Status:   Future    Standing Expiration Date:   05/14/2024   CBC with Differential (Cancer Center Only)    Standing Status:   Future    Standing Expiration Date:   05/14/2024   All questions were answered. The patient knows to call the clinic with any problems, questions or concerns.  I have spent a total of 30 minutes minutes of face-to-face and non-face-to-face time, preparing to see the patient, performing a medically appropriate examination, counseling and educating the patient, documenting clinical information in the electronic health record.   Georga Kaufmann PA-C Dept of Hematology and Oncology [facility] Phone: [phone]    03/29/2023 2:45 PM   Literature Support:  1) Darrol Jump Bortezomib, lenalidomide, and dexamethasone in transplant-eligible newly diagnosed multiple myeloma patients: a multicenter retrospective comparative analysis. Int Ladora Daniel. 2020 Jan;111(1):103-111. doi: 10.1007/s12185-019-02764-1.  -- Overall response, very good partial response, and complete response rates after VRD were 96.4%, 45.5%, and 20.0%, respectively (median follow-up period, 17.7 months). The 1-year progression-free survival (PFS) and overall survival rates were 95.8% and 98.2%, respectively. The response rate and PFS were similar between the groups, regardless of cytogenetic risk and age.  2) Solon Palm, [address] [place], Pawlyn Salena Saner, [place], [place], [place], [place] A, Jones JR, Kishore B, Garg M, Williams CD, Karunanithi Ferne Coe MW, Marti Sleigh [place], [place], [place] MT, Berneda Rose WM, Jovita Kussmaul; [place] [facility] [place]. Lenalidomide maintenance versus observation for patients with newly diagnosed multiple myeloma (Myeloma XI): a multicentre, open-label, randomised, phase 3 trial. Lancet Oncol. 2019 Jan;20(1):57-73. doi: 10.1016/S1470-2045(18)30687-9. Epub 2018 Dec 14.   --Maintenance therapy with lenalidomide significantly improved progression-free survival in patients with newly diagnosed multiple myeloma compared with observation   --After a median follow-up of 31 months (IQR 18-50), median progression-free survival was 39 months (95% CI 36-42) with lenalidomide and 20 months (18-22) with observation (hazard ratio [HR] 046 [95% CI 041-053]; p<00001), and 3-year overall survival was 786% (95% Cl 756-816) in the lenalidomide group and 758% (160-737) in the observation group (HR 087 [95% CI 073-105]; p=015). Progression-free survival was improved with lenalidomide compared with observation across all prespecified subgroups.

## 2023-03-27 NOTE — Patient Instructions (Signed)
[facility]  Discharge Instructions: Thank you for choosing [facility] to provide your oncology and hematology care.   If you have a lab appointment with the [facility], please go directly to the [facility] and check in at the registration area.   Wear comfortable clothing and clothing appropriate for easy access to any Portacath or PICC line.   We strive to give you quality time with your provider. You may need to reschedule your appointment if you arrive late (15 or more minutes).  Arriving late affects you and other patients whose appointments are after yours.  Also, if you miss three or more appointments without notifying the office, you may be dismissed from the clinic at the provider's discretion.      For prescription refill requests, have your pharmacy contact our office and allow 72 hours for refills to be completed.    Today you received the following chemotherapy and/or immunotherapy agents Darzalex Faspro      To help prevent nausea and vomiting after your treatment, we encourage you to take your nausea medication as directed.  BELOW ARE SYMPTOMS THAT SHOULD BE REPORTED IMMEDIATELY: *FEVER GREATER THAN 100.4 F (38 C) OR HIGHER *CHILLS OR SWEATING *NAUSEA AND VOMITING THAT IS NOT CONTROLLED WITH YOUR NAUSEA MEDICATION *UNUSUAL SHORTNESS OF BREATH *UNUSUAL BRUISING OR BLEEDING *URINARY PROBLEMS (pain or burning when urinating, or frequent urination) *BOWEL PROBLEMS (unusual diarrhea, constipation, pain near the anus) TENDERNESS IN MOUTH AND THROAT WITH OR WITHOUT PRESENCE OF ULCERS (sore throat, sores in mouth, or a toothache) UNUSUAL RASH, SWELLING OR PAIN  UNUSUAL VAGINAL DISCHARGE OR ITCHING   Items with * indicate a potential emergency and should be followed up as soon as possible or go to the Emergency Department if any problems should occur.  Please show the CHEMOTHERAPY ALERT CARD or IMMUNOTHERAPY ALERT CARD at  check-in to the Emergency Department and triage nurse.  Should you have questions after your visit or need to cancel or reschedule your appointment, please contact [facility]  Dept: [phone]  and follow the prompts.  Office hours are 8:00 a.m. to 4:30 p.m. Monday - Friday. Please note that voicemails left after 4:00 p.m. may not be returned until the following business day.  We are closed weekends and major holidays. You have access to a nurse at all times for urgent questions. Please call the main number to the clinic Dept: [phone] and follow the prompts.   For any non-urgent questions, you may also contact your provider using MyChart. We now offer e-Visits for anyone 18 and older to request care online for non-urgent symptoms. For details visit [email or web address].   Also download the MyChart app! Go to the app store, search "MyChart", open the app, select , and log in with your MyChart username and password.  

## 2023-03-28 LAB — BETA 2 MICROGLOBULIN, SERUM: Beta-2 Microglobulin: 1.6 mg/L (ref 0.6–2.4)

## 2023-03-29 ENCOUNTER — Encounter: Payer: Self-pay | Admitting: Hematology and Oncology

## 2023-03-30 ENCOUNTER — Telehealth: Payer: Self-pay | Admitting: Physician Assistant

## 2023-03-30 LAB — KAPPA/LAMBDA LIGHT CHAINS
Kappa free light chain: 14.7 mg/L (ref 3.3–19.4)
Kappa, lambda light chain ratio: 0.1 — ABNORMAL LOW (ref 0.26–1.65)
Lambda free light chains: 150.2 mg/L — ABNORMAL HIGH (ref 5.7–26.3)

## 2023-03-30 NOTE — Telephone Encounter (Signed)
Called pt to see how she did with her new medications. She reports doing well & denies any side effects.  She knows how to reach [place] if needed & knows her next appts.

## 2023-03-30 NOTE — Telephone Encounter (Signed)
-----   Message from Marylouise Stacks, RN sent at 03/27/2023  2:32 PM EDT ----- Regarding: Dr. Maurine Cane 1st time Darzalex Faspro injection f/u Dr. Leonides Schanz 1st time Darzalex Faspro. Pt tolerated tx well. Pt call back due.

## 2023-04-03 ENCOUNTER — Inpatient Hospital Stay: Payer: Medicare Other

## 2023-04-03 ENCOUNTER — Other Ambulatory Visit: Payer: Self-pay

## 2023-04-03 ENCOUNTER — Encounter: Payer: Self-pay | Admitting: Hematology and Oncology

## 2023-04-03 VITALS — BP 119/73 | HR 58 | Temp 97.8°F | Resp 17 | Wt 134.0 lb

## 2023-04-03 DIAGNOSIS — Z7961 Long term (current) use of immunomodulator: Secondary | ICD-10-CM | POA: Diagnosis not present

## 2023-04-03 DIAGNOSIS — M549 Dorsalgia, unspecified: Secondary | ICD-10-CM | POA: Diagnosis not present

## 2023-04-03 DIAGNOSIS — C9000 Multiple myeloma not having achieved remission: Secondary | ICD-10-CM

## 2023-04-03 DIAGNOSIS — Z79899 Other long term (current) drug therapy: Secondary | ICD-10-CM | POA: Diagnosis not present

## 2023-04-03 DIAGNOSIS — E162 Hypoglycemia, unspecified: Secondary | ICD-10-CM | POA: Diagnosis not present

## 2023-04-03 DIAGNOSIS — Z5112 Encounter for antineoplastic immunotherapy: Secondary | ICD-10-CM | POA: Diagnosis not present

## 2023-04-03 LAB — CMP (CANCER CENTER ONLY)
ALT: 29 U/L (ref 0–44)
AST: 21 U/L (ref 15–41)
Albumin: 4.3 g/dL (ref 3.5–5.0)
Alkaline Phosphatase: 53 U/L (ref 38–126)
Anion gap: 4 — ABNORMAL LOW (ref 5–15)
BUN: 18 mg/dL (ref 8–23)
CO2: 28 mmol/L (ref 22–32)
Calcium: 9.8 mg/dL (ref 8.9–10.3)
Chloride: 107 mmol/L (ref 98–111)
Creatinine: 0.86 mg/dL (ref 0.44–1.00)
GFR, Estimated: >60 mL/min (ref >60)
Glucose, Bld: 86 mg/dL (ref 70–99)
Potassium: 4 mmol/L (ref 3.5–5.1)
Sodium: 139 mmol/L (ref 135–145)
Total Bilirubin: 0.7 mg/dL (ref 0.3–1.2)
Total Protein: 6.7 g/dL (ref 6.5–8.1)

## 2023-04-03 LAB — CBC WITH DIFFERENTIAL (CANCER CENTER ONLY)
Abs Immature Granulocytes: 0.01 10*3/uL (ref 0.00–0.07)
Basophils Absolute: 0 10*3/uL (ref 0.0–0.1)
Basophils Relative: 1 %
Eosinophils Absolute: 0.5 10*3/uL (ref 0.0–0.5)
Eosinophils Relative: 11 %
HCT: 39 % (ref 36.0–46.0)
Hemoglobin: 12.8 g/dL (ref 12.0–15.0)
Immature Granulocytes: 0 %
Lymphocytes Relative: 19 %
Lymphs Abs: 0.9 10*3/uL (ref 0.7–4.0)
MCH: 29.6 pg (ref 26.0–34.0)
MCHC: 32.8 g/dL (ref 30.0–36.0)
MCV: 90.1 fL (ref 80.0–100.0)
Monocytes Absolute: 0.4 10*3/uL (ref 0.1–1.0)
Monocytes Relative: 9 %
Neutro Abs: 2.8 10*3/uL (ref 1.7–7.7)
Neutrophils Relative %: 60 %
Platelet Count: 198 10*3/uL (ref 150–400)
RBC: 4.33 MIL/uL (ref 3.87–5.11)
RDW: 14.2 % (ref 11.5–15.5)
WBC Count: 4.6 10*3/uL (ref 4.0–10.5)
nRBC: 0 % (ref 0.0–0.2)

## 2023-04-03 LAB — MULTIPLE MYELOMA PANEL, SERUM
Albumin SerPl Elph-Mcnc: 3.7 g/dL (ref 2.9–4.4)
Albumin/Glob SerPl: 1.5 (ref 0.7–1.7)
Alpha 1: 0.2 g/dL (ref 0.0–0.4)
Alpha2 Glob SerPl Elph-Mcnc: 0.7 g/dL (ref 0.4–1.0)
B-Globulin SerPl Elph-Mcnc: 0.9 g/dL (ref 0.7–1.3)
Gamma Glob SerPl Elph-Mcnc: 0.8 g/dL (ref 0.4–1.8)
Globulin, Total: 2.6 g/dL (ref 2.2–3.9)
IgA: 173 mg/dL (ref 64–422)
IgG (Immunoglobin G), Serum: 825 mg/dL (ref 586–1602)
IgM (Immunoglobulin M), Srm: 29 mg/dL (ref 26–217)
Total Protein ELP: 6.3 g/dL (ref 6.0–8.5)

## 2023-04-03 LAB — LACTATE DEHYDROGENASE: LDH: 131 U/L (ref 98–192)

## 2023-04-03 MED ORDER — DIPHENHYDRAMINE HCL 25 MG PO CAPS
50.0000 mg | ORAL_CAPSULE | Freq: Once | ORAL | Status: AC
  Administered 2023-04-03: 50 mg via ORAL
  Filled 2023-04-03: qty 2

## 2023-04-03 MED ORDER — MONTELUKAST SODIUM 10 MG PO TABS
10.0000 mg | ORAL_TABLET | Freq: Once | ORAL | Status: AC
  Administered 2023-04-03: 10 mg via ORAL
  Filled 2023-04-03: qty 1

## 2023-04-03 MED ORDER — DEXAMETHASONE 4 MG PO TABS
20.0000 mg | ORAL_TABLET | Freq: Once | ORAL | Status: AC
  Administered 2023-04-03: 20 mg via ORAL
  Filled 2023-04-03: qty 5

## 2023-04-03 MED ORDER — DARATUMUMAB-HYALURONIDASE-FIHJ 1800-30000 MG-UT/15ML ~~LOC~~ SOLN
1800.0000 mg | Freq: Once | SUBCUTANEOUS | Status: AC
  Administered 2023-04-03: 1800 mg via SUBCUTANEOUS
  Filled 2023-04-03: qty 15

## 2023-04-03 MED ORDER — ACETAMINOPHEN 325 MG PO TABS
650.0000 mg | ORAL_TABLET | Freq: Once | ORAL | Status: AC
  Administered 2023-04-03: 650 mg via ORAL
  Filled 2023-04-03: qty 2

## 2023-04-03 NOTE — Progress Notes (Signed)
Patient remained for 1 hour posy observation. VSS and ambulated to the lobby.

## 2023-04-07 ENCOUNTER — Telehealth: Payer: Self-pay | Admitting: Family Medicine

## 2023-04-07 NOTE — Telephone Encounter (Signed)
Contacted Antoinique Costea to schedule their annual wellness visit. Appointment made for 04/07/2024.  Thank you,  [facility] Support [facility] Direct dial  [phone]

## 2023-04-08 ENCOUNTER — Other Ambulatory Visit: Payer: Self-pay | Admitting: *Deleted

## 2023-04-08 ENCOUNTER — Ambulatory Visit (INDEPENDENT_AMBULATORY_CARE_PROVIDER_SITE_OTHER): Payer: Medicare Other | Admitting: *Deleted

## 2023-04-08 DIAGNOSIS — C9000 Multiple myeloma not having achieved remission: Secondary | ICD-10-CM

## 2023-04-08 DIAGNOSIS — Z0000 Encounter for general adult medical examination without abnormal findings: Secondary | ICD-10-CM

## 2023-04-08 MED ORDER — POMALIDOMIDE 2 MG PO CAPS
2.0000 mg | ORAL_CAPSULE | Freq: Every day | ORAL | 0 refills | Status: DC
Start: 2023-04-08 — End: 2023-05-04

## 2023-04-08 NOTE — Progress Notes (Signed)
Subjective:   [facility] is a 77 y.o. female who presents for Medicare Annual (Subsequent) preventive examination.  I connected with  Kalen Shelby on 04/08/23 by a telephone enabled telemedicine application and verified that I am speaking with the correct person using two identifiers.   I discussed the limitations of evaluation and management by telemedicine. The patient expressed understanding and agreed to proceed.  Patient location: home  Provider location: telephone/home   Review of Systems     Cardiac Risk Factors include: advanced age (>60men, >20 women)     Objective:    Today's Vitals   04/08/23 1305  PainSc: 3    There is no height or weight on file to calculate BMI.     04/08/2023    1:10 PM 03/27/2023   11:02 AM 06/12/2022   11:40 AM 04/15/2022   11:35 AM 04/03/2022    8:54 AM 06/28/2021   10:03 AM 06/07/2021   11:05 AM  Advanced Directives  Does Patient Have a Medical Advance Directive? Yes Yes Yes Yes Yes Yes Yes  Type of Estate agent of [employer] Power of [place];Living will  Healthcare Power of [employer] Power of [employer] Power of [employer] Power of Attorney  Does patient want to make changes to medical advance directive?  No - Patient declined No - Patient declined      Copy of Healthcare Power of Attorney in Chart? Yes - validated most recent copy scanned in chart (See row information)   Yes - validated most recent copy scanned in chart (See row information) Yes - validated most recent copy scanned in chart (See row information) Yes - validated most recent copy scanned in chart (See row information)     Current Medications (verified) Outpatient Encounter Medications as of 04/08/2023  Medication Sig   acyclovir (ZOVIRAX) 400 MG tablet Take 1 tablet (400 mg total) by mouth 2 (two) times daily.   aspirin 81 MG tablet Take 81 mg by mouth daily.    atorvastatin (LIPITOR) 80 MG tablet TAKE 1  TABLET(80 MG) BY MOUTH DAILY AT 6 PM   benazepril (LOTENSIN) 10 MG tablet TAKE 1 TABLET(10 MG) BY MOUTH DAILY   calcium carbonate (TUMS - DOSED IN MG ELEMENTAL CALCIUM) 500 MG chewable tablet Chew 1 tablet by mouth daily. Daily PRN   cholecalciferol (VITAMIN D3) 25 MCG (1000 UNIT) tablet Take 2,000 Units by mouth daily.   gabapentin (NEURONTIN) 100 MG capsule Take 1 capsule (100 mg total) by mouth 2 (two) times daily as needed.   levothyroxine (SYNTHROID) 25 MCG tablet TAKE 1 TABLET(25 MCG) BY MOUTH DAILY BEFORE BREAKFAST   Magnesium 100 MG TABS Take 100 mg by mouth daily.   polyethylene glycol (MIRALAX / GLYCOLAX) 17 g packet Take 17 g by mouth daily as needed.   pomalidomide (POMALYST) 2 MG capsule Take 1 capsule (2 mg total) by mouth daily. Take for 21 days, then none for 7 days. Repeat every 28 days. [employer] Auth # [id number] Date Obtained 04/08/23   Zoledronic Acid (ZOMETA IV) Inject into the vein. Every 3 months   [DISCONTINUED] pomalidomide (POMALYST) 2 MG capsule Take 1 capsule (2 mg total) by mouth daily. Take for 21 days, then none for 7 days. Repeat every 28 days. [employer] Auth # [id number] Obtained 03/05/23   No facility-administered encounter medications on file as of 04/08/2023.    Allergies (verified) Poison ivy extract, Plavix [clopidogrel bisulfate], and Other   History: Past Medical History:  Diagnosis Date   Asthma    Cataract    GERD (gastroesophageal reflux disease)    Hyperlipidemia    Hypertension    Thyroid disease    Past Surgical History:  Procedure Laterality Date   Cataract Surgery     CORONARY ANGIOPLASTY WITH STENT PLACEMENT     EYE SURGERY     ORTHOPEDIC SURGERY  2012   TONSILLECTOMY  1951   Family History  Problem Relation Age of Onset   Cancer Father    Heart disease Brother    Dementia Brother    Leukemia Brother    Breast cancer Neg Hx    Colon cancer Neg Hx    Esophageal cancer Neg Hx    Pancreatic cancer Neg Hx    Rectal cancer Neg Hx     Stomach cancer Neg Hx    Social History   Socioeconomic History   Marital status: Married    Spouse name: Not on file   Number of children: 0   Years of education: Not on file   Highest education level: Not on file  Occupational History   Occupation: unemployed  Tobacco Use   Smoking status: Never   Smokeless tobacco: Never  Vaping Use   Vaping Use: Never used  Substance and Sexual Activity   Alcohol use: Yes    Alcohol/week: 1.0 standard drink of alcohol    Types: 1 Glasses of wine per week    Comment: occassionally   Drug use: No   Sexual activity: Not Currently  Other Topics Concern   Not on file  Social History Narrative   Married. Education: college. Pt does exercise-   Social Determinants of Health   Financial Resource Strain: Low Risk  (04/08/2023)   Overall Financial Resource Strain (CARDIA)    Difficulty of Paying Living Expenses: Not hard at all  Food Insecurity: No Food Insecurity (04/08/2023)   Hunger Vital Sign    Worried About Running Out of Food in the Last Year: Never true    Ran Out of Food in the Last Year: Never true  Transportation Needs: No Transportation Needs (04/08/2023)   PRAPARE - Administrator, Civil Service (Medical): No    Lack of Transportation (Non-Medical): No  Physical Activity: Sufficiently Active (04/08/2023)   Exercise Vital Sign    Days of Exercise per Week: 5 days    Minutes of Exercise per Session: 40 min  Stress: No Stress Concern Present (04/08/2023)   [employer] of Occupational Health - Occupational Stress Questionnaire    Feeling of Stress : Not at all  Social Connections: Moderately Integrated (04/08/2023)   Social Connection and Isolation Panel [NHANES]    Frequency of Communication with Friends and Family: More than three times a week    Frequency of Social Gatherings with Friends and Family: Twice a week    Attends Religious Services: Never    Database administrator or Organizations: Yes    Attends  Engineer, structural: More than 4 times per year    Marital Status: Married    Tobacco Counseling Counseling given: Not Answered   Clinical Intake:  Pre-visit preparation completed: Yes  Pain : 0-10 Pain Score: 3  Pain Type: Chronic pain     Diabetes: No  How often do you need to have someone help you when you read instructions, pamphlets, or other written materials from your doctor or pharmacy?: 1 - Never  Diabetic?  no  Interpreter Needed?: No  Information  entered by :: Remi Haggard LPN   Activities of Daily Living    04/08/2023    1:11 PM 04/07/2023   10:59 AM  In your present state of health, do you have any difficulty performing the following activities:  Hearing? 0 0  Vision? 0 0  Difficulty concentrating or making decisions? 0 0  Walking or climbing stairs? 0 0  Dressing or bathing? 0 0  Doing errands, shopping? 0 0  Preparing Food and eating ? N N  Using the Toilet? N N  In the past six months, have you accidently leaked urine? N N  Do you have problems with loss of bowel control? N N  Managing your Medications? N N  Managing your Finances? N N  Housekeeping or managing your Housekeeping? N N    Patient Care Team: Shade Flood, MD as PCP - General (Family Medicine) Yates Decamp, MD as Consulting Physician (Cardiology) Marzella Schlein., MD (Ophthalmology)  Indicate any recent Medical Services you may have received from other than [facility] providers in the past year (date may be approximate).     Assessment:   This is a routine wellness examination for Jaeci.  Hearing/Vision screen Hearing Screening - Comments:: No trouble hearing Vision Screening - Comments:: Up to date [place]  Dietary issues and exercise activities discussed: Current Exercise Habits: Home exercise routine, Type of exercise: walking, Time (Minutes): 40, Frequency (Times/Week): 5, Weekly Exercise (Minutes/Week): 200   Goals Addressed             This Visit's Progress     Patient Stated       Keep things stable       Depression Screen    04/08/2023    1:14 PM 01/08/2023   10:26 AM 10/08/2022    2:31 PM 07/03/2022   10:29 AM 01/02/2022    1:41 PM 11/21/2021   10:04 AM 12/20/2020    3:49 PM  PHQ 2/9 Scores  PHQ - 2 Score 0 0 0 0 0 0 0  PHQ- 9 Score 2  0  1 0     Fall Risk    04/08/2023    1:09 PM 04/07/2023   10:59 AM 01/08/2023   10:26 AM 10/08/2022    2:33 PM 07/03/2022   10:29 AM  Fall Risk   Falls in the past year? 0 0 0 0 0  Number falls in past yr: 0 0 0 0 0  Injury with Fall? 0 0 0 0 0  Risk for fall due to :   No Fall Risks No Fall Risks No Fall Risks  Follow up Falls evaluation completed;Education provided;Falls prevention discussed  Falls evaluation completed Falls evaluation completed Falls evaluation completed    FALL RISK PREVENTION PERTAINING TO THE HOME:  Any stairs in or around the home? Yes  If so, are there any without handrails? No  Home free of loose throw rugs in walkways, pet beds, electrical cords, etc? Yes  Adequate lighting in your home to reduce risk of falls? Yes   ASSISTIVE DEVICES UTILIZED TO PREVENT FALLS:  Life alert? No  Use of a cane, walker or w/c? No  Grab bars in the bathroom? Yes  Shower chair or bench in shower? Yes  Elevated toilet seat or a handicapped toilet? Yes   TIMED UP AND GO:  Was the test performed? No .    Cognitive Function:        04/08/2023    1:10 PM 04/03/2022  8:59 AM 06/01/2020    9:12 AM 11/21/2019   10:25 AM 09/07/2017    3:49 PM  6CIT Screen  What Year? 0 points 0 points 0 points 0 points 0 points  What month? 0 points 0 points 0 points 0 points 0 points  What time? 0 points 0 points 0 points 0 points 0 points  Count back from 20 0 points 0 points 0 points 0 points 0 points  Months in reverse 0 points 0 points 0 points 0 points 0 points  Repeat phrase 0 points 0 points 0 points 0 points 0 points  Total Score 0 points 0 points 0 points 0 points 0 points     Immunizations Immunization History  Administered Date(s) Administered   Hepatitis A 07/14/2006, 04/02/2007   Hepatitis B 05/30/1998, 10/26/1998   IPV 07/14/2006   Influenza, Seasonal, Injecte, Preservative Fre 12/13/2012   Influenza,inj,Quad PF,6+ Mos 09/12/2013, 10/16/2014, 09/24/2015, 08/25/2016, 09/16/2017, 10/08/2018, 07/31/2020   Influenza-Unspecified 08/07/2021   PFIZER(Purple Top)SARS-COV-2 Vaccination 01/07/2020, 01/31/2020, 08/13/2020, 04/23/2021   Pneumococcal Conjugate-13 05/14/2015   Pneumococcal Polysaccharide-23 10/15/2013   Td 06/01/2020   Tdap 07/14/2006   Typhoid Inactivated 07/14/2006   Typhoid Parenteral 07/14/2006   Yellow Fever 07/14/2006   Zoster Recombinat (Shingrix) 03/31/2017, 09/14/2017   Zoster, Live 11/23/2008    TDAP status: Up to date  Flu Vaccine status: Up to date  Pneumococcal vaccine status: Up to date  Covid-19 vaccine status: Information provided on how to obtain vaccines.   Qualifies for Shingles Vaccine? No   Zostavax completed Yes   Shingrix Completed?: Yes  Screening Tests Health Maintenance  Topic Date Due   COVID-19 Vaccine (5 - 2023-24 season) 07/25/2022   INFLUENZA VACCINE  06/25/2023   Medicare Annual Wellness (AWV)  04/07/2024   DTaP/Tdap/Td (3 - Td or Tdap) 06/01/2030   Pneumonia Vaccine 63+ Years old  Completed   DEXA SCAN  Completed   Hepatitis C Screening  Completed   Zoster Vaccines- Shingrix  Completed   HPV VACCINES  Aged Out   COLONOSCOPY (Pts 45-28yrs Insurance coverage will need to be confirmed)  Discontinued    Health Maintenance  Health Maintenance Due  Topic Date Due   COVID-19 Vaccine (5 - 2023-24 season) 07/25/2022    Colorectal cancer screening: No longer required.   Mammogram status: Completed 2024. Repeat every year  Bone Density completed 2024  Lung Cancer Screening: (Low Dose CT Chest recommended if Age 39-80 years, 30 pack-year currently smoking OR have quit w/in 15years.) does not  qualify.   Lung Cancer Screening Referral:   Additional Screening:  Hepatitis C Screening: does not qualify; Completed 2018  Vision Screening: Recommended annual ophthalmology exams for early detection of glaucoma and other disorders of the eye. Is the patient up to date with their annual eye exam?  Yes  Who is the provider or what is the name of the office in which the patient attends annual eye exams? [facility] If pt is not established with a provider, would they like to be referred to a provider to establish care? No .   Dental Screening: Recommended annual dental exams for proper oral hygiene  Community Resource Referral / Chronic Care Management: CRR required this visit?  No   CCM required this visit?  No      Plan:     I have personally reviewed and noted the following in the patient's chart:   Medical and social history Use of alcohol, tobacco or illicit drugs  Current medications and  supplements including opioid prescriptions. Patient is not currently taking opioid prescriptions. Functional ability and status Nutritional status Physical activity Advanced directives List of other physicians Hospitalizations, surgeries, and ER visits in previous 12 months Vitals Screenings to include cognitive, depression, and falls Referrals and appointments  In addition, I have reviewed and discussed with patient certain preventive protocols, quality metrics, and best practice recommendations. A written personalized care plan for preventive services as well as general preventive health recommendations were provided to patient.     Remi Haggard, LPN   [id number]   Nurse Notes:

## 2023-04-08 NOTE — Patient Instructions (Signed)
Doris Wilkerson , Thank you for taking time to come for your Medicare Wellness Visit. I appreciate your ongoing commitment to your health goals. Please review the following plan we discussed and let me know if I can assist you in the future.   Screening recommendations/referrals: Colonoscopy: no longer required Mammogram: up to date Bone Density: up to date Recommended yearly ophthalmology/optometry visit for glaucoma screening and checkup Recommended yearly dental visit for hygiene and checkup  Vaccinations: Influenza vaccine: Education provided Pneumococcal vaccine: up to date Tdap vaccine: up to date Shingles vaccine: up to date    Advanced directives: yes     Preventive Care 65 Years and Older, Female Preventive care refers to lifestyle choices and visits with your health care provider that can promote health and wellness. What does preventive care include? A yearly physical exam. This is also called an annual well check. Dental exams once or twice a year. Routine eye exams. Ask your health care provider how often you should have your eyes checked. Personal lifestyle choices, including: Daily care of your teeth and gums. Regular physical activity. Eating a healthy diet. Avoiding tobacco and drug use. Limiting alcohol use. Practicing safe sex. Taking low-dose aspirin every day. Taking vitamin and mineral supplements as recommended by your health care provider. What happens during an annual well check? The services and screenings done by your health care provider during your annual well check will depend on your age, overall health, lifestyle risk factors, and family history of disease. Counseling  Your health care provider may ask you questions about your: Alcohol use. Tobacco use. Drug use. Emotional well-being. Home and relationship well-being. Sexual activity. Eating habits. History of falls. Memory and ability to understand (cognition). Work and work  Astronomer. Reproductive health. Screening  You may have the following tests or measurements: Height, weight, and BMI. Blood pressure. Lipid and cholesterol levels. These may be checked every 5 years, or more frequently if you are over 79 years old. Skin check. Lung cancer screening. You may have this screening every year starting at age 22 if you have a 30-pack-year history of smoking and currently smoke or have quit within the past 15 years. Fecal occult blood test (FOBT) of the stool. You may have this test every year starting at age 64. Flexible sigmoidoscopy or colonoscopy. You may have a sigmoidoscopy every 5 years or a colonoscopy every 10 years starting at age 24. Hepatitis C blood test. Hepatitis B blood test. Sexually transmitted disease (STD) testing. Diabetes screening. This is done by checking your blood sugar (glucose) after you have not eaten for a while (fasting). You may have this done every 1-3 years. Bone density scan. This is done to screen for osteoporosis. You may have this done starting at age 37. Mammogram. This may be done every 1-2 years. Talk to your health care provider about how often you should have regular mammograms. Talk with your health care provider about your test results, treatment options, and if necessary, the need for more tests. Vaccines  Your health care provider may recommend certain vaccines, such as: Influenza vaccine. This is recommended every year. Tetanus, diphtheria, and acellular pertussis (Tdap, Td) vaccine. You may need a Td booster every 10 years. Zoster vaccine. You may need this after age 47. Pneumococcal 13-valent conjugate (PCV13) vaccine. One dose is recommended after age 33. Pneumococcal polysaccharide (PPSV23) vaccine. One dose is recommended after age 51. Talk to your health care provider about which screenings and vaccines you need and how often  you need them. This information is not intended to replace advice given to you by  your health care provider. Make sure you discuss any questions you have with your health care provider. Document Released: 12/07/2015 Document Revised: 07/30/2016 Document Reviewed: 09/11/2015 Elsevier Interactive Patient Education  2017 [employer] Prevention in the Home Falls can cause injuries. They can happen to people of all ages. There are many things you can do to make your home safe and to help prevent falls. What can I do on the outside of my home? Regularly fix the edges of walkways and driveways and fix any cracks. Remove anything that might make you trip as you walk through a door, such as a raised step or threshold. Trim any bushes or trees on the path to your home. Use bright outdoor lighting. Clear any walking paths of anything that might make someone trip, such as rocks or tools. Regularly check to see if handrails are loose or broken. Make sure that both sides of any steps have handrails. Any raised decks and porches should have guardrails on the edges. Have any leaves, snow, or ice cleared regularly. Use sand or salt on walking paths during winter. Clean up any spills in your garage right away. This includes oil or grease spills. What can I do in the bathroom? Use night lights. Install grab bars by the toilet and in the tub and shower. Do not use towel bars as grab bars. Use non-skid mats or decals in the tub or shower. If you need to sit down in the shower, use a plastic, non-slip stool. Keep the floor dry. Clean up any water that spills on the floor as soon as it happens. Remove soap buildup in the tub or shower regularly. Attach bath mats securely with double-sided non-slip rug tape. Do not have throw rugs and other things on the floor that can make you trip. What can I do in the bedroom? Use night lights. Make sure that you have a light by your bed that is easy to reach. Do not use any sheets or blankets that are too big for your bed. They should not hang  down onto the floor. Have a firm chair that has side arms. You can use this for support while you get dressed. Do not have throw rugs and other things on the floor that can make you trip. What can I do in the kitchen? Clean up any spills right away. Avoid walking on wet floors. Keep items that you use a lot in easy-to-reach places. If you need to reach something above you, use a strong step stool that has a grab bar. Keep electrical cords out of the way. Do not use floor polish or wax that makes floors slippery. If you must use wax, use non-skid floor wax. Do not have throw rugs and other things on the floor that can make you trip. What can I do with my stairs? Do not leave any items on the stairs. Make sure that there are handrails on both sides of the stairs and use them. Fix handrails that are broken or loose. Make sure that handrails are as long as the stairways. Check any carpeting to make sure that it is firmly attached to the stairs. Fix any carpet that is loose or worn. Avoid having throw rugs at the top or bottom of the stairs. If you do have throw rugs, attach them to the floor with carpet tape. Make sure that you have a light  switch at the top of the stairs and the bottom of the stairs. If you do not have them, ask someone to add them for you. What else can I do to help prevent falls? Wear shoes that: Do not have high heels. Have rubber bottoms. Are comfortable and fit you well. Are closed at the toe. Do not wear sandals. If you use a stepladder: Make sure that it is fully opened. Do not climb a closed stepladder. Make sure that both sides of the stepladder are locked into place. Ask someone to hold it for you, if possible. Clearly mark and make sure that you can see: Any grab bars or handrails. First and last steps. Where the edge of each step is. Use tools that help you move around (mobility aids) if they are needed. These  include: Canes. Walkers. Scooters. Crutches. Turn on the lights when you go into a dark area. Replace any light bulbs as soon as they burn out. Set up your furniture so you have a clear path. Avoid moving your furniture around. If any of your floors are uneven, fix them. If there are any pets around you, be aware of where they are. Review your medicines with your doctor. Some medicines can make you feel dizzy. This can increase your chance of falling. Ask your doctor what other things that you can do to help prevent falls. This information is not intended to replace advice given to you by your health care provider. Make sure you discuss any questions you have with your health care provider. Document Released: 09/06/2009 Document Revised: 04/17/2016 Document Reviewed: 12/15/2014 Elsevier Interactive Patient Education  2017 [employer].

## 2023-04-10 ENCOUNTER — Inpatient Hospital Stay: Payer: Medicare Other

## 2023-04-10 VITALS — BP 144/61 | HR 63 | Temp 98.2°F | Resp 18 | Wt 136.2 lb

## 2023-04-10 DIAGNOSIS — C9000 Multiple myeloma not having achieved remission: Secondary | ICD-10-CM | POA: Diagnosis not present

## 2023-04-10 DIAGNOSIS — Z7961 Long term (current) use of immunomodulator: Secondary | ICD-10-CM | POA: Diagnosis not present

## 2023-04-10 DIAGNOSIS — Z5112 Encounter for antineoplastic immunotherapy: Secondary | ICD-10-CM | POA: Diagnosis not present

## 2023-04-10 DIAGNOSIS — E162 Hypoglycemia, unspecified: Secondary | ICD-10-CM | POA: Diagnosis not present

## 2023-04-10 DIAGNOSIS — M549 Dorsalgia, unspecified: Secondary | ICD-10-CM | POA: Diagnosis not present

## 2023-04-10 DIAGNOSIS — Z79899 Other long term (current) drug therapy: Secondary | ICD-10-CM | POA: Diagnosis not present

## 2023-04-10 LAB — CBC WITH DIFFERENTIAL (CANCER CENTER ONLY)
Abs Immature Granulocytes: 0.01 10*3/uL (ref 0.00–0.07)
Basophils Absolute: 0 10*3/uL (ref 0.0–0.1)
Basophils Relative: 1 %
Eosinophils Absolute: 0.1 10*3/uL (ref 0.0–0.5)
Eosinophils Relative: 4 %
HCT: 39.1 % (ref 36.0–46.0)
Hemoglobin: 12.9 g/dL (ref 12.0–15.0)
Immature Granulocytes: 0 %
Lymphocytes Relative: 21 %
Lymphs Abs: 0.7 10*3/uL (ref 0.7–4.0)
MCH: 29.4 pg (ref 26.0–34.0)
MCHC: 33 g/dL (ref 30.0–36.0)
MCV: 89.1 fL (ref 80.0–100.0)
Monocytes Absolute: 0.7 10*3/uL (ref 0.1–1.0)
Monocytes Relative: 20 %
Neutro Abs: 2 10*3/uL (ref 1.7–7.7)
Neutrophils Relative %: 54 %
Platelet Count: 215 10*3/uL (ref 150–400)
RBC: 4.39 MIL/uL (ref 3.87–5.11)
RDW: 14.5 % (ref 11.5–15.5)
WBC Count: 3.6 10*3/uL — ABNORMAL LOW (ref 4.0–10.5)
nRBC: 0 % (ref 0.0–0.2)

## 2023-04-10 LAB — CMP (CANCER CENTER ONLY)
ALT: 28 U/L (ref 0–44)
AST: 21 U/L (ref 15–41)
Albumin: 4.4 g/dL (ref 3.5–5.0)
Alkaline Phosphatase: 54 U/L (ref 38–126)
Anion gap: 5 (ref 5–15)
BUN: 20 mg/dL (ref 8–23)
CO2: 29 mmol/L (ref 22–32)
Calcium: 9.8 mg/dL (ref 8.9–10.3)
Chloride: 106 mmol/L (ref 98–111)
Creatinine: 0.8 mg/dL (ref 0.44–1.00)
GFR, Estimated: >60 mL/min (ref >60)
Glucose, Bld: 63 mg/dL — ABNORMAL LOW (ref 70–99)
Potassium: 3.8 mmol/L (ref 3.5–5.1)
Sodium: 140 mmol/L (ref 135–145)
Total Bilirubin: 1 mg/dL (ref 0.3–1.2)
Total Protein: 6.7 g/dL (ref 6.5–8.1)

## 2023-04-10 LAB — LACTATE DEHYDROGENASE: LDH: 121 U/L (ref 98–192)

## 2023-04-10 MED ORDER — ACETAMINOPHEN 325 MG PO TABS
650.0000 mg | ORAL_TABLET | Freq: Once | ORAL | Status: AC
  Administered 2023-04-10: 650 mg via ORAL
  Filled 2023-04-10: qty 2

## 2023-04-10 MED ORDER — DARATUMUMAB-HYALURONIDASE-FIHJ 1800-30000 MG-UT/15ML ~~LOC~~ SOLN
1800.0000 mg | Freq: Once | SUBCUTANEOUS | Status: AC
  Administered 2023-04-10: 1800 mg via SUBCUTANEOUS
  Filled 2023-04-10: qty 15

## 2023-04-10 MED ORDER — DEXAMETHASONE 4 MG PO TABS
20.0000 mg | ORAL_TABLET | Freq: Once | ORAL | Status: AC
  Administered 2023-04-10: 20 mg via ORAL
  Filled 2023-04-10: qty 5

## 2023-04-10 MED ORDER — DIPHENHYDRAMINE HCL 25 MG PO CAPS
50.0000 mg | ORAL_CAPSULE | Freq: Once | ORAL | Status: AC
  Administered 2023-04-10: 50 mg via ORAL
  Filled 2023-04-10: qty 2

## 2023-04-10 MED ORDER — MONTELUKAST SODIUM 10 MG PO TABS
10.0000 mg | ORAL_TABLET | Freq: Once | ORAL | Status: AC
  Administered 2023-04-10: 10 mg via ORAL
  Filled 2023-04-10: qty 1

## 2023-04-10 NOTE — Patient Instructions (Signed)
[facility]  Discharge Instructions: Thank you for choosing [facility] to provide your oncology and hematology care.   If you have a lab appointment with the Cancer Center, please go directly to the Cancer Center and check in at the registration area.   Wear comfortable clothing and clothing appropriate for easy access to any Portacath or PICC line.   We strive to give you quality time with your provider. You may need to reschedule your appointment if you arrive late (15 or more minutes).  Arriving late affects you and other patients whose appointments are after yours.  Also, if you miss three or more appointments without notifying the office, you may be dismissed from the clinic at the provider's discretion.      For prescription refill requests, have your pharmacy contact our office and allow 72 hours for refills to be completed.    Today you received the following chemotherapy and/or immunotherapy agents Darzalex Faspro      To help prevent nausea and vomiting after your treatment, we encourage you to take your nausea medication as directed.  BELOW ARE SYMPTOMS THAT SHOULD BE REPORTED IMMEDIATELY: *FEVER GREATER THAN 100.4 F (38 C) OR HIGHER *CHILLS OR SWEATING *NAUSEA AND VOMITING THAT IS NOT CONTROLLED WITH YOUR NAUSEA MEDICATION *UNUSUAL SHORTNESS OF BREATH *UNUSUAL BRUISING OR BLEEDING *URINARY PROBLEMS (pain or burning when urinating, or frequent urination) *BOWEL PROBLEMS (unusual diarrhea, constipation, pain near the anus) TENDERNESS IN MOUTH AND THROAT WITH OR WITHOUT PRESENCE OF ULCERS (sore throat, sores in mouth, or a toothache) UNUSUAL RASH, SWELLING OR PAIN  UNUSUAL VAGINAL DISCHARGE OR ITCHING   Items with * indicate a potential emergency and should be followed up as soon as possible or go to the Emergency Department if any problems should occur.  Please show the CHEMOTHERAPY ALERT CARD or IMMUNOTHERAPY ALERT CARD at  check-in to the Emergency Department and triage nurse.  Should you have questions after your visit or need to cancel or reschedule your appointment, please contact [facility]  Dept: [phone]  and follow the prompts.  Office hours are 8:00 a.m. to 4:30 p.m. Monday - Friday. Please note that voicemails left after 4:00 p.m. may not be returned until the following business day.  We are closed weekends and major holidays. You have access to a nurse at all times for urgent questions. Please call the main number to the clinic Dept: [phone] and follow the prompts.   For any non-urgent questions, you may also contact your provider using MyChart. We now offer e-Visits for anyone 18 and older to request care online for non-urgent symptoms. For details visit [email or web address].   Also download the MyChart app! Go to the app store, search "MyChart", open the app, select [facility], and log in with your MyChart username and password.  

## 2023-04-10 NOTE — Progress Notes (Signed)
Immediately after Darzalex Faspro injection, patient c/o some stinging and itching at injection site.  RN noticed a 4 cm x 7 cm area of erythema medial to injection site.  Patient reported redness after first Darzalex Faspro injection, but no itching nor stinging.  Daphane Shepherd, PA, at chairside, gave OK for discharge.  VSS at discharge, pt reported itching and stinging beginning to subside.  Patient ambulatory to lobby.

## 2023-04-11 ENCOUNTER — Encounter: Payer: Self-pay | Admitting: Physician Assistant

## 2023-04-12 ENCOUNTER — Other Ambulatory Visit: Payer: Self-pay | Admitting: Family Medicine

## 2023-04-12 DIAGNOSIS — G6289 Other specified polyneuropathies: Secondary | ICD-10-CM

## 2023-04-13 NOTE — Telephone Encounter (Signed)
Gabapentin 100 mg  LOV: 01/08/23 Last /Refill:/07/03/22 Upcoming appt: 07/09/23

## 2023-04-14 NOTE — Telephone Encounter (Signed)
Gabapentin discussed at recent visit, controlled substance database reviewed.  No concerns, refilled.

## 2023-04-17 ENCOUNTER — Inpatient Hospital Stay (HOSPITAL_BASED_OUTPATIENT_CLINIC_OR_DEPARTMENT_OTHER): Payer: Medicare Other | Admitting: Physician Assistant

## 2023-04-17 ENCOUNTER — Inpatient Hospital Stay: Payer: Medicare Other

## 2023-04-17 VITALS — BP 129/66 | HR 70 | Temp 97.5°F | Resp 13 | Wt 136.6 lb

## 2023-04-17 DIAGNOSIS — E162 Hypoglycemia, unspecified: Secondary | ICD-10-CM | POA: Diagnosis not present

## 2023-04-17 DIAGNOSIS — C9000 Multiple myeloma not having achieved remission: Secondary | ICD-10-CM

## 2023-04-17 DIAGNOSIS — Z7961 Long term (current) use of immunomodulator: Secondary | ICD-10-CM | POA: Diagnosis not present

## 2023-04-17 DIAGNOSIS — M549 Dorsalgia, unspecified: Secondary | ICD-10-CM | POA: Diagnosis not present

## 2023-04-17 DIAGNOSIS — Z79899 Other long term (current) drug therapy: Secondary | ICD-10-CM | POA: Diagnosis not present

## 2023-04-17 DIAGNOSIS — Z5112 Encounter for antineoplastic immunotherapy: Secondary | ICD-10-CM | POA: Diagnosis not present

## 2023-04-17 LAB — CMP (CANCER CENTER ONLY)
ALT: 24 U/L (ref 0–44)
AST: 21 U/L (ref 15–41)
Albumin: 4.2 g/dL (ref 3.5–5.0)
Alkaline Phosphatase: 56 U/L (ref 38–126)
Anion gap: 6 (ref 5–15)
BUN: 18 mg/dL (ref 8–23)
CO2: 24 mmol/L (ref 22–32)
Calcium: 9.5 mg/dL (ref 8.9–10.3)
Chloride: 108 mmol/L (ref 98–111)
Creatinine: 0.79 mg/dL (ref 0.44–1.00)
GFR, Estimated: >60 mL/min (ref >60)
Glucose, Bld: 70 mg/dL (ref 70–99)
Potassium: 4.3 mmol/L (ref 3.5–5.1)
Sodium: 138 mmol/L (ref 135–145)
Total Bilirubin: 0.8 mg/dL (ref 0.3–1.2)
Total Protein: 6.4 g/dL — ABNORMAL LOW (ref 6.5–8.1)

## 2023-04-17 LAB — CBC WITH DIFFERENTIAL (CANCER CENTER ONLY)
Abs Immature Granulocytes: 0.01 10*3/uL (ref 0.00–0.07)
Basophils Absolute: 0 10*3/uL (ref 0.0–0.1)
Basophils Relative: 1 %
Eosinophils Absolute: 0.1 10*3/uL (ref 0.0–0.5)
Eosinophils Relative: 5 %
HCT: 37 % (ref 36.0–46.0)
Hemoglobin: 12.5 g/dL (ref 12.0–15.0)
Immature Granulocytes: 0 %
Lymphocytes Relative: 34 %
Lymphs Abs: 0.8 10*3/uL (ref 0.7–4.0)
MCH: 29.6 pg (ref 26.0–34.0)
MCHC: 33.8 g/dL (ref 30.0–36.0)
MCV: 87.7 fL (ref 80.0–100.0)
Monocytes Absolute: 0.8 10*3/uL (ref 0.1–1.0)
Monocytes Relative: 32 %
Neutro Abs: 0.7 10*3/uL — ABNORMAL LOW (ref 1.7–7.7)
Neutrophils Relative %: 28 %
Platelet Count: 187 10*3/uL (ref 150–400)
RBC: 4.22 MIL/uL (ref 3.87–5.11)
RDW: 14.6 % (ref 11.5–15.5)
WBC Count: 2.5 10*3/uL — ABNORMAL LOW (ref 4.0–10.5)
nRBC: 0 % (ref 0.0–0.2)

## 2023-04-17 LAB — LACTATE DEHYDROGENASE: LDH: 115 U/L (ref 98–192)

## 2023-04-17 MED ORDER — DEXAMETHASONE 4 MG PO TABS
20.0000 mg | ORAL_TABLET | Freq: Once | ORAL | Status: AC
  Administered 2023-04-17: 20 mg via ORAL
  Filled 2023-04-17: qty 5

## 2023-04-17 MED ORDER — DARATUMUMAB-HYALURONIDASE-FIHJ 1800-30000 MG-UT/15ML ~~LOC~~ SOLN
1800.0000 mg | Freq: Once | SUBCUTANEOUS | Status: AC
  Administered 2023-04-17: 1800 mg via SUBCUTANEOUS
  Filled 2023-04-17: qty 15

## 2023-04-17 MED ORDER — DIPHENHYDRAMINE HCL 25 MG PO CAPS
50.0000 mg | ORAL_CAPSULE | Freq: Once | ORAL | Status: AC
  Administered 2023-04-17: 50 mg via ORAL
  Filled 2023-04-17: qty 2

## 2023-04-17 MED ORDER — ACETAMINOPHEN 325 MG PO TABS
650.0000 mg | ORAL_TABLET | Freq: Once | ORAL | Status: AC
  Administered 2023-04-17: 650 mg via ORAL
  Filled 2023-04-17: qty 2

## 2023-04-17 NOTE — Progress Notes (Signed)
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 Concerning for multiple myeloma vs metastatic disease 12/31/2020: establish care with Dr. Leonides Schanz. UPEP showed marked Bence Jones protein, findings concerning for multiple myleoma 01/21/2021: bone marrow biopsy confirms multiple myeloma with atypical plasma cells representing 28% of all cells in the aspirate  02/01/2021: Cycle 1 Day 1 of VRd chemotherapy 02/22/2021: Cycle 2 Day 1 of VRd chemotherapy 03/15/2021: Cycle 3 Day 1 of VRd chemotherapy 04/05/2021: Cycle 4 Day 1 of VRd chemotherapy 04/26/2021: Cycle 5 Day 1 of VRd chemotherapy 05/17/2021: Cycle 6 Day 1 of VRd chemotherapy 06/07/2021:  Cycle 7 Day 1 of VRd chemotherapy 06/28/2021: Cycle 8 Day 1 of VRd chemotherapy 07/19/2021: start maintenance revlimid 10mg  PO daily.  03/26/2022: Cycle 1 Day 1 of Dara/Pom/Dex  Interval History:  Doris Wilkerson 77 y.o. female with medical history significant for lambda light chain multiple myeloma who presents for a follow up visit. She was last seen on 03/27/2023. In the interim, she started Dara/Pom/Dex. She is due for Cycle 1, Day 22 today.   On exam today Doris Wilkerson reports that overall she is doing well. She has 1-2 days of fatigue after each treatment but continues to complete her ADLs  on her own. She reports her appetite and weight are stable. She reports her mid back pain is more noticeable but symptoms are managed with tylenol TID and gabapentin BID. She adds that changing her bra without wiring has helped to improve the pain. She denies any nausea, vomiting or  abdominal pain. Her bowel habits are regular without recurrent episodes of diarrhea or constipation.  She denies easy bruising or signs of bleeding. She denies fevers, chills, night sweats, shortness of breath, chest pain or cough. She has no other complaints. A full 10 point ROS is listed below.   MEDICAL HISTORY:  Past Medical History:  Diagnosis Date   Asthma    Cataract    GERD (gastroesophageal reflux disease)    Hyperlipidemia    Hypertension    Thyroid disease     SURGICAL HISTORY: Past Surgical History:  Procedure Laterality Date   Cataract Surgery     CORONARY ANGIOPLASTY WITH STENT PLACEMENT     EYE SURGERY     ORTHOPEDIC SURGERY  2012   TONSILLECTOMY  1951    SOCIAL HISTORY: Social History   Socioeconomic History   Marital status: Married    Spouse name: Not on file   Number of children: 0   Years of education: Not on file   Highest education level: Not on file  Occupational History   Occupation: unemployed  Tobacco Use   Smoking status: Never   Smokeless tobacco: Never  Vaping Use   Vaping Use: Never used  Substance and Sexual Activity   Alcohol use: Yes    Alcohol/week: 1.0 standard drink of alcohol    Types: 1 Glasses of wine per week    Comment: occassionally   Drug use: No   Sexual activity: Not Currently  Other Topics Concern   Not on file  Social History Narrative   Married. Education: college. Pt does exercise-   Social Determinants of Health  Financial Resource Strain: Low Risk  (04/08/2023)   Overall Financial Resource Strain (CARDIA)    Difficulty of Paying Living Expenses: Not hard at all  Food Insecurity: No Food Insecurity (04/08/2023)   Hunger Vital Sign    Worried About Running Out of Food in the Last Year: Never true    Ran Out of Food in the Last Year: Never true  Transportation Needs: No Transportation Needs (04/08/2023)   PRAPARE - Administrator, Civil Service (Medical): No    Lack of Transportation (Non-Medical):  No  Physical Activity: Sufficiently Active (04/08/2023)   Exercise Vital Sign    Days of Exercise per Week: 5 days    Minutes of Exercise per Session: 40 min  Stress: No Stress Concern Present (04/08/2023)   [employer] of Occupational Health - Occupational Stress Questionnaire    Feeling of Stress : Not at all  Social Connections: Moderately Integrated (04/08/2023)   Social Connection and Isolation Panel [NHANES]    Frequency of Communication with Friends and Family: More than three times a week    Frequency of Social Gatherings with Friends and Family: Twice a week    Attends Religious Services: Never    Database administrator or Organizations: Yes    Attends Engineer, structural: More than 4 times per year    Marital Status: Married  Catering manager Violence: Not At Risk (04/08/2023)   Humiliation, Afraid, Rape, and Kick questionnaire    Fear of Current or Ex-Partner: No    Emotionally Abused: No    Physically Abused: No    Sexually Abused: No    FAMILY HISTORY: Family History  Problem Relation Age of Onset   Cancer Father    Heart disease Brother    Dementia Brother    Leukemia Brother    Breast cancer Neg Hx    Colon cancer Neg Hx    Esophageal cancer Neg Hx    Pancreatic cancer Neg Hx    Rectal cancer Neg Hx    Stomach cancer Neg Hx     ALLERGIES:  is allergic to poison ivy extract, plavix [clopidogrel bisulfate], and other.  MEDICATIONS:  Current Outpatient Medications  Medication Sig Dispense Refill   acyclovir (ZOVIRAX) 400 MG tablet Take 1 tablet (400 mg total) by mouth 2 (two) times daily. 60 tablet 3   aspirin 81 MG tablet Take 81 mg by mouth daily.      atorvastatin (LIPITOR) 80 MG tablet TAKE 1 TABLET(80 MG) BY MOUTH DAILY AT 6 PM 90 tablet 1   benazepril (LOTENSIN) 10 MG tablet TAKE 1 TABLET(10 MG) BY MOUTH DAILY 90 tablet 1   calcium carbonate (TUMS - DOSED IN MG ELEMENTAL CALCIUM) 500 MG chewable tablet Chew 1 tablet by mouth daily. Daily  PRN     cholecalciferol (VITAMIN D3) 25 MCG (1000 UNIT) tablet Take 2,000 Units by mouth daily.     gabapentin (NEURONTIN) 100 MG capsule TAKE 1 CAPSULE(100 MG) BY MOUTH TWICE DAILY AS NEEDED 180 capsule 1   levothyroxine (SYNTHROID) 25 MCG tablet TAKE 1 TABLET(25 MCG) BY MOUTH DAILY BEFORE BREAKFAST 90 tablet 3   Magnesium 100 MG TABS Take 100 mg by mouth daily.     polyethylene glycol (MIRALAX / GLYCOLAX) 17 g packet Take 17 g by mouth daily as needed.     pomalidomide (POMALYST) 2 MG capsule Take 1 capsule (2 mg total) by mouth daily. Take for 21 days, then none for 7 days. Repeat every 28  days. Siri Cole # [id number] Date Obtained 04/08/23 21 capsule 0   Zoledronic Acid (ZOMETA IV) Inject into the vein. Every 3 months     No current facility-administered medications for this visit.    REVIEW OF SYSTEMS:   Constitutional: ( - ) fevers, ( - )  chills , ( - ) night sweats Eyes: ( - ) blurriness of vision, ( - ) double vision, ( - ) watery eyes Ears, nose, mouth, throat, and face: ( - ) mucositis, ( - ) sore throat Respiratory: ( - ) cough, ( - ) dyspnea, ( - ) wheezes Cardiovascular: ( - ) palpitation, ( - ) chest discomfort, ( - ) lower extremity swelling Gastrointestinal:  ( - ) nausea, ( - ) heartburn, ( - ) change in bowel habits Skin: ( - ) abnormal skin rashes Lymphatics: ( - ) new lymphadenopathy, ( - ) easy bruising Neurological: ( +) numbness, ( - ) tingling, ( - ) new weaknesses Behavioral/Psych: ( - ) mood change, ( - ) new changes  All other systems were reviewed with the patient and are negative.  PHYSICAL EXAMINATION: ECOG PERFORMANCE STATUS: 1 - Symptomatic but completely ambulatory  Vitals:   04/17/23 0902  BP: 129/66  Pulse: 70  Resp: 13  Temp: (!) 97.5 F (36.4 C)  SpO2: 100%   Filed Weights   04/17/23 0902  Weight: 136 lb 9.6 oz (62 kg)    GENERAL: well appearing elderly Caucasian female in NAD  SKIN: skin color, texture, turgor are normal, no rashes or  significant lesions EYES: conjunctiva are pink and non-injected, sclera clear LUNGS: clear to auscultation and percussion with normal breathing effort HEART: regular rate & rhythm and no murmurs and trace lower extremity edema Musculoskeletal: no cyanosis of digits and no clubbing  PSYCH: alert & oriented x 3, fluent speech NEURO: no focal motor/sensory deficits  LABORATORY DATA:  I have reviewed the data as listed    Latest Ref Rng & Units 04/17/2023    8:27 AM 04/10/2023    9:35 AM 04/03/2023    9:57 AM  CBC  WBC 4.0 - 10.5 K/uL 2.5  3.6  4.6   Hemoglobin 12.0 - 15.0 g/dL 54.0  98.1  19.1   Hematocrit 36.0 - 46.0 % 37.0  39.1  39.0   Platelets 150 - 400 K/uL 187  215  198        Latest Ref Rng & Units 04/17/2023    8:27 AM 04/10/2023    9:35 AM 04/03/2023    9:57 AM  CMP  Glucose 70 - 99 mg/dL 70  63  86   BUN 8 - 23 mg/dL 18  20  18    Creatinine 0.44 - 1.00 mg/dL 4.78  2.95  6.21   Sodium 135 - 145 mmol/L 138  140  139   Potassium 3.5 - 5.1 mmol/L 4.3  3.8  4.0   Chloride 98 - 111 mmol/L 108  106  107   CO2 22 - 32 mmol/L 24  29  28    Calcium 8.9 - 10.3 mg/dL 9.5  9.8  9.8   Total Protein 6.5 - 8.1 g/dL 6.4  6.7  6.7   Total Bilirubin 0.3 - 1.2 mg/dL 0.8  1.0  0.7   Alkaline Phos 38 - 126 U/L 56  54  53   AST 15 - 41 U/L 21  21  21    ALT 0 - 44 U/L 24  28  29  Lab Results  Component Value Date   MPROTEIN Not Observed 03/27/2023   MPROTEIN Not Observed 02/24/2023   MPROTEIN Not Observed 01/30/2023   Lab Results  Component Value Date   KPAFRELGTCHN 14.7 03/27/2023   KPAFRELGTCHN 25.3 (H) 02/24/2023   KPAFRELGTCHN 29.4 (H) 01/30/2023   LAMBDASER 150.2 (H) 03/27/2023   LAMBDASER 133.6 (H) 02/24/2023   LAMBDASER 119.3 (H) 01/30/2023   KAPLAMBRATIO 0.10 (L) 03/27/2023   KAPLAMBRATIO 6.92 02/24/2023   KAPLAMBRATIO 0.19 (L) 02/24/2023    RADIOGRAPHIC STUDIES: No results found.  ASSESSMENT & PLAN Doris Wilkerson 77 y.o. female with medical history  significant for lambda light chain multiple myeloma who presents for a follow up visit. Doris Wilkerson is not a candidate for bone marrow transplant based on her advanced age.     # Lambda Light Chain Multiple Myeloma--VGPR on maintenance --diagnosis confirmed with bone marrow biopsy and urine protein analysis.   --patient has good functional status and would be an excellent candidate for VRd chemotherapy. I do not believe she would be a bone marrow transplant candidate based on her age.  --Received VRD chemotherapy from 02/01/2021-07/12/2021 then started maintenance Revlimid on 07/19/2021.  --Due to rising lambda light chain, recommended to switch to Daratumumab/Pomalyst/Dexamethasone starting today, 03/27/2023. --Started Cycle 1, Day 1 of Dara/Pom/Dex on 03/27/2023 Plan: --Due to start Cycle 1, Day 22 of Dara/Pom/Dex today --Labs today show white blood cell count 2.5, ANC 0.7, hemoglobin 12.5, MCV 87.7, and platelets of 187.  Creatinine and LFTs are within normal limits. --Patient started her off week of Pomalyst today. Recommend to hold Pomalyst until [facility] improve. Will reassess based on next weeks labs. Gave precautions for neutropenia including monitoring for fevers. Proceed with Dara/Dex today.  --Return to clinic weekly for labs/treatment and follow up visit in 2 weeks  #Hypoglycemia: --Glucose was 63 last week and 70 day, non-fasting --Patient is asymptomatic today. Encouraged to eat right before treatment and snacks every 2-3 hours.  --Patient will check her glucose levels on non-treatment days and follow up with PCP if non-fasting glucose is consistently 70 or below.  #Back Pain -- Currently well controlled on gabapentin and Tylenol --CT thoracic and lumbar spine showed no lytic lesions or sequelae of multiple myeloma --MRI thoracic and lumbar spine from 07/07/2022 for baseline imaging. Findings showed multiple bone lesions involving posterior T9 and T8, lumbar spine and upper sacrum consistent  with multiple myeloma. No extension into the canal. No pathologic fracture.  --Patient plan to follow up with Dr .Donalee Citrin, neurosugery --Continue to monitor  #Supportive Care --chemotherapy education complete  --zofran 8mg  q8H PRN and compazine 10mg  PO q6H for nausea -- acyclovir 400mg  PO BID for VCZ prophylaxis --zometa therapy last received on 02/24/2023. Next due in July 2024. -- no prescription pain medication required at this time.   No orders of the defined types were placed in this encounter.  All questions were answered. The patient knows to call the clinic with any problems, questions or concerns.  I have spent a total of 30 minutes minutes of face-to-face and non-face-to-face time, preparing to see the patient, performing a medically appropriate examination, counseling and educating the patient, documenting clinical information in the electronic health record.   Georga Kaufmann PA-C Dept of Hematology and Oncology [facility] Phone: [phone]    04/17/2023 9:34 AM   Literature Support:  1) De Hollingshead  Andrez Grime. Bortezomib, lenalidomide, and dexamethasone in transplant-eligible newly diagnosed multiple myeloma patients: a multicenter retrospective comparative analysis. Int Ladora Daniel. 2020 Jan;111(1):103-111. doi: 10.1007/s12185-019-02764-1.  -- Overall response, very good partial response, and complete response rates after VRD were 96.4%, 45.5%, and 20.0%, respectively (median follow-up period, 17.7 months). The 1-year progression-free survival (PFS) and overall survival rates were 95.8% and 98.2%, respectively. The response rate and PFS were similar between the groups, regardless of cytogenetic risk and age.  2) [place]on [place], [address] FE, Pawlyn Salena Saner, [place], [place], [place], [place] A, Jones JR, Kishore B, Garg M, Williams CD,  Karunanithi Ferne Coe MW, Marti Sleigh NH, [place], Dr[place] MT, Berneda Rose WM, Jovita Kussmaul; [place] [facility]. Lenalidomide maintenance versus observation for patients with newly diagnosed multiple myeloma (Myeloma XI): a multicentre, open-label, randomised, phase 3 trial. Lancet Oncol. 2019 Jan;20(1):57-73. doi: 10.1016/S1470-2045(18)30687-9. Epub 2018 Dec 14.   --Maintenance therapy with lenalidomide significantly improved progression-free survival in patients with newly diagnosed multiple myeloma compared with observation   --After a median follow-up of 31 months (IQR 18-50), median progression-free survival was 39 months (95% CI 36-42) with lenalidomide and 20 months (18-22) with observation (hazard ratio [HR] 046 [95% CI 041-053]; p<00001), and 3-year overall survival was 786% (95% Cl 756-816) in the lenalidomide group and 758% (161-096) in the observation group (HR 087 [95% CI 073-105]; p=015). Progression-free survival was improved with lenalidomide compared with observation across all prespecified subgroups.

## 2023-04-17 NOTE — Patient Instructions (Signed)
[facility]  Discharge Instructions: Thank you for choosing [facility] to provide your oncology and hematology care.   If you have a lab appointment with the Cancer Center, please go directly to the Cancer Center and check in at the registration area.   Wear comfortable clothing and clothing appropriate for easy access to any Portacath or PICC line.   We strive to give you quality time with your provider. You may need to reschedule your appointment if you arrive late (15 or more minutes).  Arriving late affects you and other patients whose appointments are after yours.  Also, if you miss three or more appointments without notifying the office, you may be dismissed from the clinic at the provider's discretion.      For prescription refill requests, have your pharmacy contact our office and allow 72 hours for refills to be completed.    Today you received the following chemotherapy and/or immunotherapy agents Darzalex Faspro      To help prevent nausea and vomiting after your treatment, we encourage you to take your nausea medication as directed.  BELOW ARE SYMPTOMS THAT SHOULD BE REPORTED IMMEDIATELY: *FEVER GREATER THAN 100.4 F (38 C) OR HIGHER *CHILLS OR SWEATING *NAUSEA AND VOMITING THAT IS NOT CONTROLLED WITH YOUR NAUSEA MEDICATION *UNUSUAL SHORTNESS OF BREATH *UNUSUAL BRUISING OR BLEEDING *URINARY PROBLEMS (pain or burning when urinating, or frequent urination) *BOWEL PROBLEMS (unusual diarrhea, constipation, pain near the anus) TENDERNESS IN MOUTH AND THROAT WITH OR WITHOUT PRESENCE OF ULCERS (sore throat, sores in mouth, or a toothache) UNUSUAL RASH, SWELLING OR PAIN  UNUSUAL VAGINAL DISCHARGE OR ITCHING   Items with * indicate a potential emergency and should be followed up as soon as possible or go to the Emergency Department if any problems should occur.  Please show the CHEMOTHERAPY ALERT CARD or IMMUNOTHERAPY ALERT CARD at  check-in to the Emergency Department and triage nurse.  Should you have questions after your visit or need to cancel or reschedule your appointment, please contact [facility]  Dept: [phone]  and follow the prompts.  Office hours are 8:00 a.m. to 4:30 p.m. Monday - Friday. Please note that voicemails left after 4:00 p.m. may not be returned until the following business day.  We are closed weekends and major holidays. You have access to a nurse at all times for urgent questions. Please call the main number to the clinic Dept: [phone] and follow the prompts.   For any non-urgent questions, you may also contact your provider using MyChart. We now offer e-Visits for anyone 18 and older to request care online for non-urgent symptoms. For details visit [email or web address].   Also download the MyChart app! Go to the app store, search "MyChart", open the app, select [facility], and log in with your MyChart username and password.  

## 2023-04-21 ENCOUNTER — Other Ambulatory Visit: Payer: Medicare Other

## 2023-04-24 ENCOUNTER — Inpatient Hospital Stay: Payer: Medicare Other

## 2023-04-24 ENCOUNTER — Encounter: Payer: Self-pay | Admitting: Hematology and Oncology

## 2023-04-24 ENCOUNTER — Other Ambulatory Visit: Payer: Self-pay | Admitting: Hematology and Oncology

## 2023-04-24 VITALS — BP 152/71 | HR 68 | Temp 98.6°F | Resp 18 | Wt 137.0 lb

## 2023-04-24 DIAGNOSIS — Z5112 Encounter for antineoplastic immunotherapy: Secondary | ICD-10-CM | POA: Diagnosis not present

## 2023-04-24 DIAGNOSIS — C9000 Multiple myeloma not having achieved remission: Secondary | ICD-10-CM

## 2023-04-24 DIAGNOSIS — M549 Dorsalgia, unspecified: Secondary | ICD-10-CM | POA: Diagnosis not present

## 2023-04-24 DIAGNOSIS — Z79899 Other long term (current) drug therapy: Secondary | ICD-10-CM | POA: Diagnosis not present

## 2023-04-24 DIAGNOSIS — Z7961 Long term (current) use of immunomodulator: Secondary | ICD-10-CM | POA: Diagnosis not present

## 2023-04-24 DIAGNOSIS — E162 Hypoglycemia, unspecified: Secondary | ICD-10-CM | POA: Diagnosis not present

## 2023-04-24 LAB — CMP (CANCER CENTER ONLY)
ALT: 25 U/L (ref 0–44)
AST: 20 U/L (ref 15–41)
Albumin: 4.1 g/dL (ref 3.5–5.0)
Alkaline Phosphatase: 57 U/L (ref 38–126)
Anion gap: 5 (ref 5–15)
BUN: 17 mg/dL (ref 8–23)
CO2: 27 mmol/L (ref 22–32)
Calcium: 9.6 mg/dL (ref 8.9–10.3)
Chloride: 106 mmol/L (ref 98–111)
Creatinine: 0.74 mg/dL (ref 0.44–1.00)
GFR, Estimated: >60 mL/min (ref >60)
Glucose, Bld: 74 mg/dL (ref 70–99)
Potassium: 3.9 mmol/L (ref 3.5–5.1)
Sodium: 138 mmol/L (ref 135–145)
Total Bilirubin: 0.6 mg/dL (ref 0.3–1.2)
Total Protein: 6.3 g/dL — ABNORMAL LOW (ref 6.5–8.1)

## 2023-04-24 LAB — CBC WITH DIFFERENTIAL (CANCER CENTER ONLY)
Abs Immature Granulocytes: 0.01 10*3/uL (ref 0.00–0.07)
Basophils Absolute: 0.1 10*3/uL (ref 0.0–0.1)
Basophils Relative: 2 %
Eosinophils Absolute: 0.1 10*3/uL (ref 0.0–0.5)
Eosinophils Relative: 4 %
HCT: 36.6 % (ref 36.0–46.0)
Hemoglobin: 12.1 g/dL (ref 12.0–15.0)
Immature Granulocytes: 0 %
Lymphocytes Relative: 37 %
Lymphs Abs: 1.4 10*3/uL (ref 0.7–4.0)
MCH: 29.4 pg (ref 26.0–34.0)
MCHC: 33.1 g/dL (ref 30.0–36.0)
MCV: 88.8 fL (ref 80.0–100.0)
Monocytes Absolute: 0.8 10*3/uL (ref 0.1–1.0)
Monocytes Relative: 20 %
Neutro Abs: 1.4 10*3/uL — ABNORMAL LOW (ref 1.7–7.7)
Neutrophils Relative %: 37 %
Platelet Count: 260 10*3/uL (ref 150–400)
RBC: 4.12 MIL/uL (ref 3.87–5.11)
RDW: 14.8 % (ref 11.5–15.5)
WBC Count: 3.8 10*3/uL — ABNORMAL LOW (ref 4.0–10.5)
nRBC: 0 % (ref 0.0–0.2)

## 2023-04-24 LAB — LACTATE DEHYDROGENASE: LDH: 134 U/L (ref 98–192)

## 2023-04-24 MED ORDER — DEXAMETHASONE 4 MG PO TABS
20.0000 mg | ORAL_TABLET | Freq: Once | ORAL | Status: AC
  Administered 2023-04-24: 20 mg via ORAL
  Filled 2023-04-24: qty 5

## 2023-04-24 MED ORDER — DIPHENHYDRAMINE HCL 25 MG PO CAPS
50.0000 mg | ORAL_CAPSULE | Freq: Once | ORAL | Status: AC
  Administered 2023-04-24: 50 mg via ORAL
  Filled 2023-04-24: qty 2

## 2023-04-24 MED ORDER — ACETAMINOPHEN 325 MG PO TABS
650.0000 mg | ORAL_TABLET | Freq: Once | ORAL | Status: AC
  Administered 2023-04-24: 650 mg via ORAL
  Filled 2023-04-24: qty 2

## 2023-04-24 MED ORDER — DARATUMUMAB-HYALURONIDASE-FIHJ 1800-30000 MG-UT/15ML ~~LOC~~ SOLN
1800.0000 mg | Freq: Once | SUBCUTANEOUS | Status: AC
  Administered 2023-04-24: 1800 mg via SUBCUTANEOUS
  Filled 2023-04-24: qty 15

## 2023-04-24 NOTE — Progress Notes (Signed)
Per Dr. Leonides Schanz CBC is acceptable for patient to resume Pomalyst. Patient informed to start taking medication today.

## 2023-04-27 ENCOUNTER — Encounter: Payer: Self-pay | Admitting: Family Medicine

## 2023-04-27 ENCOUNTER — Ambulatory Visit (INDEPENDENT_AMBULATORY_CARE_PROVIDER_SITE_OTHER): Payer: Medicare Other | Admitting: Family Medicine

## 2023-04-27 VITALS — BP 120/70 | HR 89 | Temp 98.2°F | Wt 137.0 lb

## 2023-04-27 DIAGNOSIS — E162 Hypoglycemia, unspecified: Secondary | ICD-10-CM

## 2023-04-27 LAB — KAPPA/LAMBDA LIGHT CHAINS
Kappa free light chain: 7.8 mg/L (ref 3.3–19.4)
Kappa, lambda light chain ratio: 0.11 — ABNORMAL LOW (ref 0.26–1.65)
Lambda free light chains: 71.5 mg/L — ABNORMAL HIGH (ref 5.7–26.3)

## 2023-04-27 NOTE — Progress Notes (Signed)
Subjective:  Patient ID: Doris Wilkerson, female    DOB: 08/07/1946  Age: 77 y.o. MRN: [id number]  CC:  Chief Complaint  Patient presents with   Acute Visit    Wants to discuss recent blood sugar results which was 63 at her oncologist.     HPI [name] presents for  Hypoglycemia: Under the care of oncology/hematology with multiple myeloma, Dr. Leonides Schanz.  Note reviewed.  Pomalyst 14-14 cycle planned.  Had been treated with DARA/POM/DEX.  1 to 2 days of fatigue after each treatment based on May 24 visit.  Hypoglycemia discussed -glucose 63 on week prior and 70 at that time, nonfasting.  Asymptomatic at that time, recommended eating right before treatment and snacks every 2-3 hours  Plan for patient to check glucose levels on nontreatment days and follow-up with me if consistently 70 or below. Lab review, glucose 74 on Apr 23, 1969 on May 24, 63 on May 17, 86 on May 10, 104 on May 3, 100 on April 2. Will be seeing Dr. Leonides Schanz this Friday.   Has labs checked routinely every Friday when getting treatment - ate breakfast  few hours earlier.  Dexamethasone every Friday. Felt a little lightheaded prior to treatment on May 17th (blood sugar 63). Felt unsteady at that time, did not feel well that day and concerned about low blood sugar that day. Ate something and felt better. No home readings. Meals - eating 5 times per day.  History Patient Active Problem List   Diagnosis Date Noted   Mixed hyperlipidemia 01/11/2023   Essential hypertension 01/11/2023   Physical debility 03/21/2021   Dizziness 03/21/2021   Vertigo 03/21/2021   Anemia due to antineoplastic chemotherapy 03/21/2021   Skin changes related to chemotherapy 03/21/2021   Other constipation 03/21/2021   Multiple myeloma not having achieved remission (HCC) 01/27/2021   Hordeolum externum of left lower eyelid 06/28/2018   CAD (coronary artery disease) 12/09/2011   Dyslipidemia 12/09/2011   Osteopenia 12/09/2011    Hypothyroid 12/09/2011   Asthma 12/09/2011   Past Medical History:  Diagnosis Date   Asthma    Cataract    GERD (gastroesophageal reflux disease)    Hyperlipidemia    Hypertension    Thyroid disease    Past Surgical History:  Procedure Laterality Date   Cataract Surgery     CORONARY ANGIOPLASTY WITH STENT PLACEMENT     EYE SURGERY     ORTHOPEDIC SURGERY  2012   TONSILLECTOMY  1951   Allergies  Allergen Reactions   Poison Ivy Extract Swelling   Plavix [Clopidogrel Bisulfate]    Other     seasonal   Prior to Admission medications   Medication Sig Start Date End Date Taking? Authorizing Provider  acyclovir (ZOVIRAX) 400 MG tablet Take 1 tablet (400 mg total) by mouth 2 (two) times daily. 03/27/23  Yes Briant Cedar, PA-C  aspirin 81 MG tablet Take 81 mg by mouth daily.    Yes Dois Davenport, MD  atorvastatin (LIPITOR) 80 MG tablet TAKE 1 TABLET(80 MG) BY MOUTH DAILY AT 6 PM 01/08/23  Yes Shade Flood, MD  benazepril (LOTENSIN) 10 MG tablet TAKE 1 TABLET(10 MG) BY MOUTH DAILY 01/08/23  Yes Shade Flood, MD  calcium carbonate (TUMS - DOSED IN MG ELEMENTAL CALCIUM) 500 MG chewable tablet Chew 1 tablet by mouth daily. Daily PRN   Yes [provider]  cholecalciferol (VITAMIN D3) 25 MCG (1000 UNIT) tablet Take 2,000 Units by mouth daily.  Yes [provider]  gabapentin (NEURONTIN) 100 MG capsule TAKE 1 CAPSULE(100 MG) BY MOUTH TWICE DAILY AS NEEDED 04/14/23  Yes Shade Flood, MD  levothyroxine (SYNTHROID) 25 MCG tablet TAKE 1 TABLET(25 MCG) BY MOUTH DAILY BEFORE BREAKFAST 12/24/22  Yes Shade Flood, MD  Magnesium 100 MG TABS Take 100 mg by mouth daily.   Yes [provider]  polyethylene glycol (MIRALAX / GLYCOLAX) 17 g packet Take 17 g by mouth daily as needed.   Yes [provider]  pomalidomide (POMALYST) 2 MG capsule Take 1 capsule (2 mg total) by mouth daily. Take for 21 days, then none for 7 days. Repeat every 28 days.  Siri Cole # [id number] Date Obtained 04/08/23 04/08/23  Yes Jaci Standard, MD   Social History   Socioeconomic History   Marital status: Married    Spouse name: Not on file   Number of children: 0   Years of education: Not on file   Highest education level: Master's degree (e.g., MA, MS, MEng, MEd, MSW, MBA)  Occupational History   Occupation: unemployed  Tobacco Use   Smoking status: Never   Smokeless tobacco: Never  Vaping Use   Vaping Use: Never used  Substance and Sexual Activity   Alcohol use: Yes    Alcohol/week: 1.0 standard drink of alcohol    Types: 1 Glasses of wine per week    Comment: occassionally   Drug use: No   Sexual activity: Not Currently  Other Topics Concern   Not on file  Social History Narrative   Married. Education: college. Pt does exercise-   Social Determinants of Health   Financial Resource Strain: Low Risk  (04/24/2023)   Overall Financial Resource Strain (CARDIA)    Difficulty of Paying Living Expenses: Not hard at all  Food Insecurity: No Food Insecurity (04/24/2023)   Hunger Vital Sign    Worried About Running Out of Food in the Last Year: Never true    Ran Out of Food in the Last Year: Never true  Transportation Needs: No Transportation Needs (04/24/2023)   PRAPARE - Administrator, Civil Service (Medical): No    Lack of Transportation (Non-Medical): No  Physical Activity: Insufficiently Active (04/24/2023)   Exercise Vital Sign    Days of Exercise per Week: 7 days    Minutes of Exercise per Session: 20 min  Stress: No Stress Concern Present (04/24/2023)   [employer] of Occupational Health - Occupational Stress Questionnaire    Feeling of Stress : Only a little  Social Connections: Moderately Integrated (04/24/2023)   Social Connection and Isolation Panel [NHANES]    Frequency of Communication with Friends and Family: Twice a week    Frequency of Social Gatherings with Friends and Family: Once a week    Attends  Religious Services: Never    Database administrator or Organizations: Yes    Attends Engineer, structural: More than 4 times per year    Marital Status: Married  Catering manager Violence: Not At Risk (04/08/2023)   Humiliation, Afraid, Rape, and Kick questionnaire    Fear of Current or Ex-Partner: No    Emotionally Abused: No    Physically Abused: No    Sexually Abused: No    Review of Systems Per HPI.   Objective:   Vitals:   04/27/23 1410  BP: 120/70  Pulse: 89  Temp: 98.2 F (36.8 C)  TempSrc: Oral  SpO2: 99%  Weight: 137 lb (  62.1 kg)     Physical Exam Vitals reviewed.  Constitutional:      General: She is not in acute distress.    Appearance: Normal appearance. She is well-developed. She is not ill-appearing or diaphoretic.  HENT:     Head: Normocephalic and atraumatic.  Eyes:     Conjunctiva/sclera: Conjunctivae normal.     Pupils: Pupils are equal, round, and reactive to light.  Neck:     Vascular: No carotid bruit.  Cardiovascular:     Rate and Rhythm: Normal rate and regular rhythm.     Heart sounds: Normal heart sounds.  Pulmonary:     Effort: Pulmonary effort is normal.     Breath sounds: Normal breath sounds.  Abdominal:     General: There is no distension.     Palpations: Abdomen is soft. There is no pulsatile mass.     Tenderness: There is no abdominal tenderness.  Musculoskeletal:     Right lower leg: No edema.     Left lower leg: No edema.  Skin:    General: Skin is warm and dry.  Neurological:     Mental Status: She is alert and oriented to person, place, and time.  Psychiatric:        Mood and Affect: Mood normal.        Behavior: Behavior normal.        Assessment & Plan:  Zya Scianna is a 77 y.o. female . Hypoglycemia Single low reading as above, possibly was symptomatic at that time.  Question impact on medications versus other secondary cause.  CGM sample given for home monitoring for now, recheck 2 weeks with  those readings.  Handout given, continue regular meals to lessen chance of recurrence.  No orders of the defined types were placed in this encounter.  Patient Instructions  See info below on low blood sugar. This is less likely without diabetes med treatments but other conditions can occur to cause low blood sugar. Use the continuous monitor to check readings (it will alert you if running low). Recheck 2 weeks with these readings. We can look at labs from hematology at that time as well.  Return to the clinic or go to the nearest emergency room if any of your symptoms worsen or new symptoms occur.  Hypoglycemia Hypoglycemia occurs when the level of sugar (glucose) in the blood is too low. Hypoglycemia can happen in people who have or do not have diabetes. It can develop quickly, and it can be a medical emergency. For most people, a blood glucose level below 70 mg/dL (3.9 mmol/L) is considered hypoglycemia. Glucose is a type of sugar that provides the body's main source of energy. Certain hormones (insulin and glucagon) control the level of glucose in the blood. Insulin lowers blood glucose, and glucagon raises blood glucose. Hypoglycemia can result from having too much insulin in the bloodstream, or from not eating enough food that contains glucose. You may also have reactive hypoglycemia, which happens within 4 hours after eating a meal. What are the causes? Hypoglycemia occurs most often in people who have diabetes and may be caused by: Diabetes medicine. Not eating enough, or not eating often enough. Increased physical activity. Drinking alcohol on an empty stomach. If you do not have diabetes, hypoglycemia may be caused by: A tumor in the pancreas. Not eating enough, or not eating for long periods at a time (fasting). A severe infection or illness. Problems after having bariatric surgery. Organ failure, such as kidney  or liver failure. Certain medicines. What increases the  risk? Hypoglycemia is more likely to develop in people who: Have diabetes and take medicines to lower blood glucose. Abuse alcohol. Have a severe illness. What are the signs or symptoms? Symptoms vary depending on whether the condition is mild, moderate, or severe. Mild hypoglycemia Hunger. Sweating and feeling clammy. Dizziness or feeling light-headed. Sleepiness or restless sleep. Nausea. Increased heart rate. Headache. Blurry vision. Mood changes, such as irritability or anxiety. Tingling or numbness around the mouth, lips, or tongue. Moderate hypoglycemia Confusion and poor judgment. Behavior changes. Weakness. Irregular heartbeat. A change in coordination. Severe hypoglycemia Severe hypoglycemia is a medical emergency. It can cause: Fainting. Seizures. Loss of consciousness (coma). Death. How is this diagnosed? Hypoglycemia is diagnosed with a blood test to measure your blood glucose level. This blood test is done while you are having symptoms. Your health care provider may also do a physical exam and review your medical history. How is this treated? This condition can be treated by immediately eating or drinking something that contains sugar with 15 grams of fast-acting carbohydrate, such as: 4 oz (120 mL) of fruit juice. 4 oz (120 mL) of regular soda (not diet soda). Several pieces of hard candy. Check food labels to find out how many pieces to eat for 15 grams. 1 Tbsp (15 mL) of sugar or honey. 4 glucose tablets. 1 tube of glucose gel. Treating hypoglycemia if you have diabetes If you are alert and able to swallow safely, follow the 15:15 rule: Take 15 grams of a fast-acting carbohydrate. Talk with your health care provider about how much you should take. Options for getting 15 grams of fast-acting carbohydrate include: Glucose tablets (take 4 tablets). Several pieces of hard candy. Check food labels to find out how many pieces to eat for 15 grams. 4 oz (120 mL)  of fruit juice. 4 oz (120 mL) of regular soda (not diet soda). 1 Tbsp (15 mL) of sugar or honey. 1 tube of glucose gel. Check your blood glucose 15 minutes after you take the carbohydrate. If the repeat blood glucose level is still at or below 70 mg/dL (3.9 mmol/L), take 15 grams of a carbohydrate again. If your blood glucose level does not increase above 70 mg/dL (3.9 mmol/L) after 3 tries, seek emergency medical care. After your blood glucose level returns to normal, eat a meal or a snack within 1 hour.  Treating severe hypoglycemia Severe hypoglycemia is when your blood glucose level is below 54 mg/dL (3 mmol/L). Severe hypoglycemia is a medical emergency. Get medical help right away. If you have severe hypoglycemia and you cannot eat or drink, you will need to be given glucagon. A family member or close friend should learn how to check your blood glucose and how to give you glucagon. Ask your health care provider if you need to have an emergency glucagon kit available. Severe hypoglycemia may need to be treated in a hospital. The treatment may include getting glucose through an IV. You may also need treatment for the cause of your hypoglycemia. Follow these instructions at home:  General instructions Take over-the-counter and prescription medicines only as told by your health care provider. Monitor your blood glucose as told by your health care provider. If you drink alcohol: Limit how much you have to: 0-1 drink a day for women who are not pregnant. 0-2 drinks a day for men. Know how much alcohol is in your drink. In the [place] one drink equals one  12 oz bottle of beer (355 mL), one 5 oz glass of wine (148 mL), or one 1 oz glass of hard liquor (44 mL). Be sure to eat food along with drinking alcohol. Be aware that alcohol is absorbed quickly and may have lingering effects that may result in hypoglycemia later. Be sure to do ongoing glucose monitoring. Keep all follow-up visits. This is  important. If you have diabetes: Always have a fast-acting carbohydrate (15 grams) option with you to treat low blood glucose. Follow your diabetes management plan as directed by your health care provider. Make sure you: Know the symptoms of hypoglycemia. It is important to treat it right away to prevent it from becoming severe. Check your blood glucose as often as told. Always check before and after exercise. Always check your blood glucose before you drive a motorized vehicle. Take your medicines as told. Follow your meal plan. Eat on time, and do not skip meals. Share your diabetes management plan with people in your workplace, school, and household. Carry a medical alert card or wear medical alert jewelry. Where to find more information American Diabetes Association: www.diabetes.org Contact a health care provider if: You have problems keeping your blood glucose in your target range. You have frequent episodes of hypoglycemia. Get help right away if: You continue to have hypoglycemia symptoms after eating or drinking something that contains 15 grams of fast-acting carbohydrate, and you cannot get your blood glucose above 70 mg/dL (3.9 mmol/L) while following the 15:15 rule. Your blood glucose is below 54 mg/dL (3 mmol/L). You have a seizure. You faint. These symptoms may represent a serious problem that is an emergency. Do not wait to see if the symptoms will go away. Get medical help right away. Call your local emergency services (911 in the [place] Do not drive yourself to the hospital. Summary Hypoglycemia occurs when the level of sugar (glucose) in the blood is too low. Hypoglycemia can happen in people who have or do not have diabetes. It can develop quickly, and it can be a medical emergency. Make sure you know the symptoms of hypoglycemia and how to treat it. Always have a fast-acting carbohydrate option with you to treat low blood sugar. This information is not intended to  replace advice given to you by your health care provider. Make sure you discuss any questions you have with your health care provider. Document Revised: 10/11/2020 Document Reviewed: 10/11/2020 Elsevier Patient Education  2024 [employer] Inc.     Signed,   Meredith Staggers, MD [facility], [facility] 04/27/23 5:29 PM

## 2023-04-27 NOTE — Progress Notes (Signed)
l °

## 2023-04-27 NOTE — Patient Instructions (Addendum)
See info below on low blood sugar. This is less likely without diabetes med treatments but other conditions can occur to cause low blood sugar. Use the continuous monitor to check readings (it will alert you if running low). Recheck 2 weeks with these readings. We can look at labs from hematology at that time as well.  Return to the clinic or go to the nearest emergency room if any of your symptoms worsen or new symptoms occur.  Hypoglycemia Hypoglycemia occurs when the level of sugar (glucose) in the blood is too low. Hypoglycemia can happen in people who have or do not have diabetes. It can develop quickly, and it can be a medical emergency. For most people, a blood glucose level below 70 mg/dL (3.9 mmol/L) is considered hypoglycemia. Glucose is a type of sugar that provides the body's main source of energy. Certain hormones (insulin and glucagon) control the level of glucose in the blood. Insulin lowers blood glucose, and glucagon raises blood glucose. Hypoglycemia can result from having too much insulin in the bloodstream, or from not eating enough food that contains glucose. You may also have reactive hypoglycemia, which happens within 4 hours after eating a meal. What are the causes? Hypoglycemia occurs most often in people who have diabetes and may be caused by: Diabetes medicine. Not eating enough, or not eating often enough. Increased physical activity. Drinking alcohol on an empty stomach. If you do not have diabetes, hypoglycemia may be caused by: A tumor in the pancreas. Not eating enough, or not eating for long periods at a time (fasting). A severe infection or illness. Problems after having bariatric surgery. Organ failure, such as kidney or liver failure. Certain medicines. What increases the risk? Hypoglycemia is more likely to develop in people who: Have diabetes and take medicines to lower blood glucose. Abuse alcohol. Have a severe illness. What are the signs or  symptoms? Symptoms vary depending on whether the condition is mild, moderate, or severe. Mild hypoglycemia Hunger. Sweating and feeling clammy. Dizziness or feeling light-headed. Sleepiness or restless sleep. Nausea. Increased heart rate. Headache. Blurry vision. Mood changes, such as irritability or anxiety. Tingling or numbness around the mouth, lips, or tongue. Moderate hypoglycemia Confusion and poor judgment. Behavior changes. Weakness. Irregular heartbeat. A change in coordination. Severe hypoglycemia Severe hypoglycemia is a medical emergency. It can cause: Fainting. Seizures. Loss of consciousness (coma). Death. How is this diagnosed? Hypoglycemia is diagnosed with a blood test to measure your blood glucose level. This blood test is done while you are having symptoms. Your health care provider may also do a physical exam and review your medical history. How is this treated? This condition can be treated by immediately eating or drinking something that contains sugar with 15 grams of fast-acting carbohydrate, such as: 4 oz (120 mL) of fruit juice. 4 oz (120 mL) of regular soda (not diet soda). Several pieces of hard candy. Check food labels to find out how many pieces to eat for 15 grams. 1 Tbsp (15 mL) of sugar or honey. 4 glucose tablets. 1 tube of glucose gel. Treating hypoglycemia if you have diabetes If you are alert and able to swallow safely, follow the 15:15 rule: Take 15 grams of a fast-acting carbohydrate. Talk with your health care provider about how much you should take. Options for getting 15 grams of fast-acting carbohydrate include: Glucose tablets (take 4 tablets). Several pieces of hard candy. Check food labels to find out how many pieces to eat for 15 grams. 4  oz (120 mL) of fruit juice. 4 oz (120 mL) of regular soda (not diet soda). 1 Tbsp (15 mL) of sugar or honey. 1 tube of glucose gel. Check your blood glucose 15 minutes after you take the  carbohydrate. If the repeat blood glucose level is still at or below 70 mg/dL (3.9 mmol/L), take 15 grams of a carbohydrate again. If your blood glucose level does not increase above 70 mg/dL (3.9 mmol/L) after 3 tries, seek emergency medical care. After your blood glucose level returns to normal, eat a meal or a snack within 1 hour.  Treating severe hypoglycemia Severe hypoglycemia is when your blood glucose level is below 54 mg/dL (3 mmol/L). Severe hypoglycemia is a medical emergency. Get medical help right away. If you have severe hypoglycemia and you cannot eat or drink, you will need to be given glucagon. A family member or close friend should learn how to check your blood glucose and how to give you glucagon. Ask your health care provider if you need to have an emergency glucagon kit available. Severe hypoglycemia may need to be treated in a hospital. The treatment may include getting glucose through an IV. You may also need treatment for the cause of your hypoglycemia. Follow these instructions at home:  General instructions Take over-the-counter and prescription medicines only as told by your health care provider. Monitor your blood glucose as told by your health care provider. If you drink alcohol: Limit how much you have to: 0-1 drink a day for women who are not pregnant. 0-2 drinks a day for men. Know how much alcohol is in your drink. In the [place] one drink equals one 12 oz bottle of beer (355 mL), one 5 oz glass of wine (148 mL), or one 1 oz glass of hard liquor (44 mL). Be sure to eat food along with drinking alcohol. Be aware that alcohol is absorbed quickly and may have lingering effects that may result in hypoglycemia later. Be sure to do ongoing glucose monitoring. Keep all follow-up visits. This is important. If you have diabetes: Always have a fast-acting carbohydrate (15 grams) option with you to treat low blood glucose. Follow your diabetes management plan as directed  by your health care provider. Make sure you: Know the symptoms of hypoglycemia. It is important to treat it right away to prevent it from becoming severe. Check your blood glucose as often as told. Always check before and after exercise. Always check your blood glucose before you drive a motorized vehicle. Take your medicines as told. Follow your meal plan. Eat on time, and do not skip meals. Share your diabetes management plan with people in your workplace, school, and household. Carry a medical alert card or wear medical alert jewelry. Where to find more information American Diabetes Association: www.diabetes.org Contact a health care provider if: You have problems keeping your blood glucose in your target range. You have frequent episodes of hypoglycemia. Get help right away if: You continue to have hypoglycemia symptoms after eating or drinking something that contains 15 grams of fast-acting carbohydrate, and you cannot get your blood glucose above 70 mg/dL (3.9 mmol/L) while following the 15:15 rule. Your blood glucose is below 54 mg/dL (3 mmol/L). You have a seizure. You faint. These symptoms may represent a serious problem that is an emergency. Do not wait to see if the symptoms will go away. Get medical help right away. Call your local emergency services (911 in the [place] Do not drive yourself to the hospital.  Summary Hypoglycemia occurs when the level of sugar (glucose) in the blood is too low. Hypoglycemia can happen in people who have or do not have diabetes. It can develop quickly, and it can be a medical emergency. Make sure you know the symptoms of hypoglycemia and how to treat it. Always have a fast-acting carbohydrate option with you to treat low blood sugar. This information is not intended to replace advice given to you by your health care provider. Make sure you discuss any questions you have with your health care provider. Document Revised: 10/11/2020 Document Reviewed:  10/11/2020 Elsevier Patient Education  2024 [employer].

## 2023-05-01 ENCOUNTER — Other Ambulatory Visit: Payer: Self-pay

## 2023-05-01 ENCOUNTER — Inpatient Hospital Stay: Payer: Medicare Other | Attending: Physician Assistant

## 2023-05-01 ENCOUNTER — Inpatient Hospital Stay: Payer: Medicare Other

## 2023-05-01 ENCOUNTER — Inpatient Hospital Stay (HOSPITAL_BASED_OUTPATIENT_CLINIC_OR_DEPARTMENT_OTHER): Payer: Medicare Other | Admitting: Hematology and Oncology

## 2023-05-01 VITALS — BP 121/66 | HR 79 | Temp 97.9°F | Resp 15 | Wt 135.9 lb

## 2023-05-01 DIAGNOSIS — D6481 Anemia due to antineoplastic chemotherapy: Secondary | ICD-10-CM | POA: Diagnosis not present

## 2023-05-01 DIAGNOSIS — Z7961 Long term (current) use of immunomodulator: Secondary | ICD-10-CM | POA: Diagnosis not present

## 2023-05-01 DIAGNOSIS — C9000 Multiple myeloma not having achieved remission: Secondary | ICD-10-CM

## 2023-05-01 DIAGNOSIS — Z79899 Other long term (current) drug therapy: Secondary | ICD-10-CM | POA: Insufficient documentation

## 2023-05-01 DIAGNOSIS — M25552 Pain in left hip: Secondary | ICD-10-CM | POA: Insufficient documentation

## 2023-05-01 DIAGNOSIS — Z5112 Encounter for antineoplastic immunotherapy: Secondary | ICD-10-CM | POA: Diagnosis not present

## 2023-05-01 DIAGNOSIS — M549 Dorsalgia, unspecified: Secondary | ICD-10-CM | POA: Insufficient documentation

## 2023-05-01 DIAGNOSIS — E162 Hypoglycemia, unspecified: Secondary | ICD-10-CM | POA: Diagnosis not present

## 2023-05-01 DIAGNOSIS — C9001 Multiple myeloma in remission: Secondary | ICD-10-CM

## 2023-05-01 DIAGNOSIS — Z79624 Long term (current) use of inhibitors of nucleotide synthesis: Secondary | ICD-10-CM | POA: Diagnosis not present

## 2023-05-01 DIAGNOSIS — T451X5A Adverse effect of antineoplastic and immunosuppressive drugs, initial encounter: Secondary | ICD-10-CM | POA: Diagnosis not present

## 2023-05-01 DIAGNOSIS — Z7982 Long term (current) use of aspirin: Secondary | ICD-10-CM | POA: Insufficient documentation

## 2023-05-01 LAB — CMP (CANCER CENTER ONLY)
ALT: 28 U/L (ref 0–44)
AST: 23 U/L (ref 15–41)
Albumin: 4.5 g/dL (ref 3.5–5.0)
Alkaline Phosphatase: 52 U/L (ref 38–126)
Anion gap: 5 (ref 5–15)
BUN: 22 mg/dL (ref 8–23)
CO2: 27 mmol/L (ref 22–32)
Calcium: 9.9 mg/dL (ref 8.9–10.3)
Chloride: 105 mmol/L (ref 98–111)
Creatinine: 0.97 mg/dL (ref 0.44–1.00)
GFR, Estimated: >60 mL/min (ref >60)
Glucose, Bld: 98 mg/dL (ref 70–99)
Potassium: 4 mmol/L (ref 3.5–5.1)
Sodium: 137 mmol/L (ref 135–145)
Total Bilirubin: 0.7 mg/dL (ref 0.3–1.2)
Total Protein: 6.9 g/dL (ref 6.5–8.1)

## 2023-05-01 LAB — CBC WITH DIFFERENTIAL (CANCER CENTER ONLY)
Abs Immature Granulocytes: 0.01 10*3/uL (ref 0.00–0.07)
Basophils Absolute: 0.1 10*3/uL (ref 0.0–0.1)
Basophils Relative: 2 %
Eosinophils Absolute: 0.1 10*3/uL (ref 0.0–0.5)
Eosinophils Relative: 2 %
HCT: 38.1 % (ref 36.0–46.0)
Hemoglobin: 12.8 g/dL (ref 12.0–15.0)
Immature Granulocytes: 0 %
Lymphocytes Relative: 26 %
Lymphs Abs: 1.4 10*3/uL (ref 0.7–4.0)
MCH: 29.8 pg (ref 26.0–34.0)
MCHC: 33.6 g/dL (ref 30.0–36.0)
MCV: 88.8 fL (ref 80.0–100.0)
Monocytes Absolute: 0.6 10*3/uL (ref 0.1–1.0)
Monocytes Relative: 11 %
Neutro Abs: 3.3 10*3/uL (ref 1.7–7.7)
Neutrophils Relative %: 59 %
Platelet Count: 282 10*3/uL (ref 150–400)
RBC: 4.29 MIL/uL (ref 3.87–5.11)
RDW: 15.5 % (ref 11.5–15.5)
WBC Count: 5.5 10*3/uL (ref 4.0–10.5)
nRBC: 0 % (ref 0.0–0.2)

## 2023-05-01 LAB — LACTATE DEHYDROGENASE: LDH: 151 U/L (ref 98–192)

## 2023-05-01 MED ORDER — DIPHENHYDRAMINE HCL 25 MG PO CAPS: 50.0000 mg | ORAL_CAPSULE | Freq: Once | ORAL | Status: DC

## 2023-05-01 MED ORDER — ACETAMINOPHEN 325 MG PO TABS
650.0000 mg | ORAL_TABLET | Freq: Once | ORAL | Status: AC
  Administered 2023-05-01: 650 mg via ORAL
  Filled 2023-05-01: qty 2

## 2023-05-01 MED ORDER — DEXAMETHASONE 4 MG PO TABS
20.0000 mg | ORAL_TABLET | Freq: Once | ORAL | Status: AC
  Administered 2023-05-01: 20 mg via ORAL
  Filled 2023-05-01: qty 5

## 2023-05-01 MED ORDER — DARATUMUMAB-HYALURONIDASE-FIHJ 1800-30000 MG-UT/15ML ~~LOC~~ SOLN
1800.0000 mg | Freq: Once | SUBCUTANEOUS | Status: AC
  Administered 2023-05-01: 1800 mg via SUBCUTANEOUS
  Filled 2023-05-01: qty 15

## 2023-05-01 MED ORDER — CETIRIZINE HCL 10 MG PO TABS
10.0000 mg | ORAL_TABLET | Freq: Every day | ORAL | Status: DC
  Administered 2023-05-01: 10 mg via ORAL
  Filled 2023-05-01: qty 1

## 2023-05-01 NOTE — Progress Notes (Unsigned)
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 Concerning for multiple myeloma vs metastatic disease 12/31/2020: establish care with Dr. Leonides Schanz. UPEP showed marked Bence Jones protein, findings concerning for multiple myleoma 01/21/2021: bone marrow biopsy confirms multiple myeloma with atypical plasma cells representing 28% of all cells in the aspirate  02/01/2021: Cycle 1 Day 1 of VRd chemotherapy 02/22/2021: Cycle 2 Day 1 of VRd chemotherapy 03/15/2021: Cycle 3 Day 1 of VRd chemotherapy 04/05/2021: Cycle 4 Day 1 of VRd chemotherapy 04/26/2021: Cycle 5 Day 1 of VRd chemotherapy 05/17/2021: Cycle 6 Day 1 of VRd chemotherapy 06/07/2021:  Cycle 7 Day 1 of VRd chemotherapy 06/28/2021: Cycle 8 Day 1 of VRd chemotherapy 07/19/2021: start maintenance revlimid 10mg  PO daily.  03/26/2022: Cycle 1 Day 1 of Dara/Pom/Dex 04/24/2023: Cycle 2 Day 1 of Dara/Pom/Dex  Interval History:  Doris Wilkerson 77 y.o. female with medical history significant for lambda light chain multiple myeloma who presents for a follow up visit. She was last seen on 04/17/2023. In the interim, she started Dara/Pom/Dex. She is due for Cycle 2, Day 8 today.   On exam today Doris Wilkerson reports she has been tolerating the Darzalex and Pomalyst quite well without any major difficulties.  She reports she is not having any symptoms on the 2 mg p.o of the Pomalyst.  She reports that there has been no return of the pain in her hip.  She notes it is mild and not nearly as severe as it was initially.  She thinks that this may be unrelated or potentially due  to a weight shift off of her right leg.  She notes that she prefers a second-generation antihistamine over the Benadryl due to the drowsiness she experiences with the Benadryl..  She denies easy bruising or signs of bleeding. She denies fevers, chills, night sweats, shortness of breath, chest pain or cough. She has no other complaints. A full 10 point ROS is listed below.   Overall she is willing and able to proceed with Darzalex and Pomalyst therapy at this time.  MEDICAL HISTORY:  Past Medical History:  Diagnosis Date   Asthma    Cataract    GERD (gastroesophageal reflux disease)    Hyperlipidemia    Hypertension    Thyroid disease     SURGICAL HISTORY: Past Surgical History:  Procedure Laterality Date   Cataract Surgery     CORONARY ANGIOPLASTY WITH STENT PLACEMENT     EYE SURGERY     ORTHOPEDIC SURGERY  2012   TONSILLECTOMY  1951    SOCIAL HISTORY: Social History   Socioeconomic History   Marital status: Married    Spouse name: Not on file   Number of children: 0   Years of education: Not on file   Highest education level: Master's degree (e.g., MA, MS, MEng, MEd, MSW, MBA)  Occupational History   Occupation: unemployed  Tobacco Use   Smoking status: Never   Smokeless tobacco: Never  Vaping Use   Vaping Use: Never used  Substance and Sexual Activity   Alcohol use: Yes    Alcohol/week: 1.0 standard drink of alcohol    Types: 1 Glasses of  wine per week    Comment: occassionally   Drug use: No   Sexual activity: Not Currently  Other Topics Concern   Not on file  Social History Narrative   Married. Education: college. Pt does exercise-   Social Determinants of Health   Financial Resource Strain: Low Risk  (04/24/2023)   Overall Financial Resource Strain (CARDIA)    Difficulty of Paying Living Expenses: Not hard at all  Food Insecurity: No Food Insecurity (04/24/2023)   Hunger Vital Sign    Worried About Running Out of Food in the Last Year: Never true    Ran  Out of Food in the Last Year: Never true  Transportation Needs: No Transportation Needs (04/24/2023)   PRAPARE - Administrator, Civil Service (Medical): No    Lack of Transportation (Non-Medical): No  Physical Activity: Insufficiently Active (04/24/2023)   Exercise Vital Sign    Days of Exercise per Week: 7 days    Minutes of Exercise per Session: 20 min  Stress: No Stress Concern Present (04/24/2023)   [employer] of Occupational Health - Occupational Stress Questionnaire    Feeling of Stress : Only a little  Social Connections: Moderately Integrated (04/24/2023)   Social Connection and Isolation Panel [NHANES]    Frequency of Communication with Friends and Family: Twice a week    Frequency of Social Gatherings with Friends and Family: Once a week    Attends Religious Services: Never    Database administrator or Organizations: Yes    Attends Engineer, structural: More than 4 times per year    Marital Status: Married  Catering manager Violence: Not At Risk (04/08/2023)   Humiliation, Afraid, Rape, and Kick questionnaire    Fear of Current or Ex-Partner: No    Emotionally Abused: No    Physically Abused: No    Sexually Abused: No    FAMILY HISTORY: Family History  Problem Relation Age of Onset   Cancer Father    Heart disease Brother    Dementia Brother    Leukemia Brother    Breast cancer Neg Hx    Colon cancer Neg Hx    Esophageal cancer Neg Hx    Pancreatic cancer Neg Hx    Rectal cancer Neg Hx    Stomach cancer Neg Hx     ALLERGIES:  is allergic to poison ivy extract, plavix [clopidogrel bisulfate], and other.  MEDICATIONS:  Current Outpatient Medications  Medication Sig Dispense Refill   acyclovir (ZOVIRAX) 400 MG tablet Take 1 tablet (400 mg total) by mouth 2 (two) times daily. 60 tablet 3   aspirin 81 MG tablet Take 81 mg by mouth daily.      atorvastatin (LIPITOR) 80 MG tablet TAKE 1 TABLET(80 MG) BY MOUTH DAILY AT 6 PM 90 tablet 1    benazepril (LOTENSIN) 10 MG tablet TAKE 1 TABLET(10 MG) BY MOUTH DAILY 90 tablet 1   calcium carbonate (TUMS - DOSED IN MG ELEMENTAL CALCIUM) 500 MG chewable tablet Chew 1 tablet by mouth daily. Daily PRN     cholecalciferol (VITAMIN D3) 25 MCG (1000 UNIT) tablet Take 2,000 Units by mouth daily.     gabapentin (NEURONTIN) 100 MG capsule TAKE 1 CAPSULE(100 MG) BY MOUTH TWICE DAILY AS NEEDED 180 capsule 1   levothyroxine (SYNTHROID) 25 MCG tablet TAKE 1 TABLET(25 MCG) BY MOUTH DAILY BEFORE BREAKFAST 90 tablet 3   Magnesium 100 MG TABS Take 100 mg by mouth daily.     polyethylene  glycol (MIRALAX / GLYCOLAX) 17 g packet Take 17 g by mouth daily as needed.     pomalidomide (POMALYST) 2 MG capsule Take 1 capsule (2 mg total) by mouth daily. Take for 21 days, then none for 7 days. Repeat every 28 days. Siri Cole # [id number] Date Obtained 05/04/23 21 capsule 0   No current facility-administered medications for this visit.    REVIEW OF SYSTEMS:   Constitutional: ( - ) fevers, ( - )  chills , ( - ) night sweats Eyes: ( - ) blurriness of vision, ( - ) double vision, ( - ) watery eyes Ears, nose, mouth, throat, and face: ( - ) mucositis, ( - ) sore throat Respiratory: ( - ) cough, ( - ) dyspnea, ( - ) wheezes Cardiovascular: ( - ) palpitation, ( - ) chest discomfort, ( - ) lower extremity swelling Gastrointestinal:  ( - ) nausea, ( - ) heartburn, ( - ) change in bowel habits Skin: ( - ) abnormal skin rashes Lymphatics: ( - ) new lymphadenopathy, ( - ) easy bruising Neurological: ( +) numbness, ( - ) tingling, ( - ) new weaknesses Behavioral/Psych: ( - ) mood change, ( - ) new changes  All other systems were reviewed with the patient and are negative.  PHYSICAL EXAMINATION: ECOG PERFORMANCE STATUS: 1 - Symptomatic but completely ambulatory  Vitals:   05/01/23 1103  BP: 121/66  Pulse: 79  Resp: 15  Temp: 97.9 F (36.6 C)  SpO2: 100%    Filed Weights   05/01/23 1103  Weight: 135 lb 14.4 oz  (61.6 kg)    GENERAL: well appearing elderly Caucasian female in NAD  SKIN: skin color, texture, turgor are normal, no rashes or significant lesions EYES: conjunctiva are pink and non-injected, sclera clear LUNGS: clear to auscultation and percussion with normal breathing effort HEART: regular rate & rhythm and no murmurs and trace lower extremity edema Musculoskeletal: no cyanosis of digits and no clubbing  PSYCH: alert & oriented x 3, fluent speech NEURO: no focal motor/sensory deficits  LABORATORY DATA:  I have reviewed the data as listed    Latest Ref Rng & Units 05/01/2023   10:34 AM 04/24/2023    7:47 AM 04/17/2023    8:27 AM  CBC  WBC 4.0 - 10.5 K/uL 5.5  3.8  2.5   Hemoglobin 12.0 - 15.0 g/dL 69.6  29.5  28.4   Hematocrit 36.0 - 46.0 % 38.1  36.6  37.0   Platelets 150 - 400 K/uL 282  260  187        Latest Ref Rng & Units 05/01/2023   10:34 AM 04/24/2023    7:47 AM 04/17/2023    8:27 AM  CMP  Glucose 70 - 99 mg/dL 98  74  70   BUN 8 - 23 mg/dL 22  17  18    Creatinine 0.44 - 1.00 mg/dL 1.32  4.40  1.02   Sodium 135 - 145 mmol/L 137  138  138   Potassium 3.5 - 5.1 mmol/L 4.0  3.9  4.3   Chloride 98 - 111 mmol/L 105  106  108   CO2 22 - 32 mmol/L 27  27  24    Calcium 8.9 - 10.3 mg/dL 9.9  9.6  9.5   Total Protein 6.5 - 8.1 g/dL 6.9  6.3  6.4   Total Bilirubin 0.3 - 1.2 mg/dL 0.7  0.6  0.8   Alkaline Phos 38 - 126 U/L 52  57  56   AST 15 - 41 U/L 23  20  21    ALT 0 - 44 U/L 28  25  24      Lab Results  Component Value Date   MPROTEIN 0.2 (H) 04/24/2023   MPROTEIN Not Observed 03/27/2023   MPROTEIN Not Observed 02/24/2023   Lab Results  Component Value Date   KPAFRELGTCHN 7.8 04/24/2023   KPAFRELGTCHN 14.7 03/27/2023   KPAFRELGTCHN 25.3 (H) 02/24/2023   LAMBDASER 71.5 (H) 04/24/2023   LAMBDASER 150.2 (H) 03/27/2023   LAMBDASER 133.6 (H) 02/24/2023   KAPLAMBRATIO 0.11 (L) 04/24/2023   KAPLAMBRATIO 0.10 (L) 03/27/2023   KAPLAMBRATIO 6.92 02/24/2023     RADIOGRAPHIC STUDIES: No results found.  ASSESSMENT & PLAN Doris Wilkerson 77 y.o. female with medical history significant for lambda light chain multiple myeloma who presents for a follow up visit. Ms. Akerley is not a candidate for bone marrow transplant based on her advanced age.     # Lambda Light Chain Multiple Myeloma--VGPR on maintenance --diagnosis confirmed with bone marrow biopsy and urine protein analysis.   --patient has good functional status and would be an excellent candidate for VRd chemotherapy. I do not believe she would be a bone marrow transplant candidate based on her age.  --Received VRD chemotherapy from 02/01/2021-07/12/2021 then started maintenance Revlimid on 07/19/2021.  --Due to rising lambda light chain, recommended to switch to Daratumumab/Pomalyst/Dexamethasone starting today, 03/27/2023. --Started Cycle 1, Day 1 of Dara/Pom/Dex on 03/27/2023 Plan: --Due to start Cycle 2, Day 8 of Dara/Pom/Dex today --Labs today show white blood cell count 5.5, hemoglobin 12.8, MCV 88.8, and platelets of 282.  Creatinine and LFTs are within normal limits. -- Patient is currently on Pomalyst 2 mg p.o.  She did become neutropenic on this dose.  Patient is interested in trying different schedules for this medication including 2 weeks on and 2 weeks off.  We could also consider decreasing dose to 1 mg p.o if 2 mg causes persistent cytopenias.  --Return to clinic weekly for labs/treatment and follow up visit in 2 weeks  #Hypoglycemia: --Glucose was 98, non-fasting --Patient is asymptomatic today. Encouraged to eat right before treatment and snacks every 2-3 hours.  --Patient will check her glucose levels on non-treatment days and follow up with PCP if non-fasting glucose is consistently 70 or below.  #Back Pain/Left Hip Pain  -- Currently well controlled on gabapentin and Tylenol --CT thoracic and lumbar spine showed no lytic lesions or sequelae of multiple myeloma --MRI thoracic  and lumbar spine from 07/07/2022 for baseline imaging. Findings showed multiple bone lesions involving posterior T9 and T8, lumbar spine and upper sacrum consistent with multiple myeloma. No extension into the canal. No pathologic fracture.  --continue follow up with Dr .Donalee Citrin, neurosugery --Continue to monitor  #Supportive Care --chemotherapy education complete  --zofran 8mg  q8H PRN and compazine 10mg  PO q6H for nausea -- acyclovir 400mg  PO BID for VCZ prophylaxis --zometa therapy last received on 02/24/2023. Next due in July 2024. -- no prescription pain medication required at this time.   Orders Placed This Encounter  Procedures   Multiple Myeloma Panel (SPEP&IFE w/QIG)    Standing Status:   Future    Standing Expiration Date:   08/13/2024   Kappa/lambda light chains    Standing Status:   Future    Standing Expiration Date:   08/13/2024   CMP (Cancer Center only)    Standing Status:   Future    Standing Expiration Date:   08/13/2024  Lactate dehydrogenase (LDH)    Standing Status:   Future    Standing Expiration Date:   08/13/2024   CBC with Differential (Cancer Center Only)    Standing Status:   Future    Standing Expiration Date:   08/13/2024   CMP (Cancer Center only)    Standing Status:   Future    Standing Expiration Date:   08/27/2024   Lactate dehydrogenase (LDH)    Standing Status:   Future    Standing Expiration Date:   08/27/2024   CBC with Differential (Cancer Center Only)    Standing Status:   Future    Standing Expiration Date:   08/27/2024   All questions were answered. The patient knows to call the clinic with any problems, questions or concerns.  I have spent a total of 30 minutes minutes of face-to-face and non-face-to-face time, preparing to see the patient, performing a medically appropriate examination, counseling and educating the patient, documenting clinical information in the electronic health record.   Ulysees Barns, MD Department of  Hematology/Oncology [facility] Phone: [phone] Pager: [phone] Email: [email or web address]  05/05/2023 5:24 PM

## 2023-05-01 NOTE — Progress Notes (Signed)
Pt requested to have benadryl premedication changed d/t significant drowsiness that persists for days after tx. This RN made Dr. Leonides Schanz aware. Per Dr. Leonides Schanz PO benadryl to be substituted with Cetrizine PO 10 mg. This RN made pharmacy aware. Pt was agreeable and expressed gratitude.

## 2023-05-01 NOTE — Patient Instructions (Signed)
[facility]  Discharge Instructions: Thank you for choosing [facility] to provide your oncology and hematology care.   If you have a lab appointment with the Cancer Center, please go directly to the Cancer Center and check in at the registration area.   Wear comfortable clothing and clothing appropriate for easy access to any Portacath or PICC line.   We strive to give you quality time with your provider. You may need to reschedule your appointment if you arrive late (15 or more minutes).  Arriving late affects you and other patients whose appointments are after yours.  Also, if you miss three or more appointments without notifying the office, you may be dismissed from the clinic at the provider's discretion.      For prescription refill requests, have your pharmacy contact our office and allow 72 hours for refills to be completed.    Today you received the following chemotherapy and/or immunotherapy agents Darzalex Faspro      To help prevent nausea and vomiting after your treatment, we encourage you to take your nausea medication as directed.  BELOW ARE SYMPTOMS THAT SHOULD BE REPORTED IMMEDIATELY: *FEVER GREATER THAN 100.4 F (38 C) OR HIGHER *CHILLS OR SWEATING *NAUSEA AND VOMITING THAT IS NOT CONTROLLED WITH YOUR NAUSEA MEDICATION *UNUSUAL SHORTNESS OF BREATH *UNUSUAL BRUISING OR BLEEDING *URINARY PROBLEMS (pain or burning when urinating, or frequent urination) *BOWEL PROBLEMS (unusual diarrhea, constipation, pain near the anus) TENDERNESS IN MOUTH AND THROAT WITH OR WITHOUT PRESENCE OF ULCERS (sore throat, sores in mouth, or a toothache) UNUSUAL RASH, SWELLING OR PAIN  UNUSUAL VAGINAL DISCHARGE OR ITCHING   Items with * indicate a potential emergency and should be followed up as soon as possible or go to the Emergency Department if any problems should occur.  Please show the CHEMOTHERAPY ALERT CARD or IMMUNOTHERAPY ALERT CARD at  check-in to the Emergency Department and triage nurse.  Should you have questions after your visit or need to cancel or reschedule your appointment, please contact [facility]  Dept: [phone]  and follow the prompts.  Office hours are 8:00 a.m. to 4:30 p.m. Monday - Friday. Please note that voicemails left after 4:00 p.m. may not be returned until the following business day.  We are closed weekends and major holidays. You have access to a nurse at all times for urgent questions. Please call the main number to the clinic Dept: [phone] and follow the prompts.   For any non-urgent questions, you may also contact your provider using MyChart. We now offer e-Visits for anyone 18 and older to request care online for non-urgent symptoms. For details visit [email or web address].   Also download the MyChart app! Go to the app store, search "MyChart", open the app, select [facility], and log in with your MyChart username and password.  

## 2023-05-02 LAB — BETA 2 MICROGLOBULIN, SERUM: Beta-2 Microglobulin: 1.5 mg/L (ref 0.6–2.4)

## 2023-05-04 ENCOUNTER — Other Ambulatory Visit: Payer: Self-pay | Admitting: *Deleted

## 2023-05-04 ENCOUNTER — Encounter: Payer: Self-pay | Admitting: Hematology and Oncology

## 2023-05-04 DIAGNOSIS — C9000 Multiple myeloma not having achieved remission: Secondary | ICD-10-CM

## 2023-05-04 LAB — MULTIPLE MYELOMA PANEL, SERUM
Albumin SerPl Elph-Mcnc: 3.4 g/dL (ref 2.9–4.4)
Albumin/Glob SerPl: 1.5 (ref 0.7–1.7)
Alpha 1: 0.2 g/dL (ref 0.0–0.4)
Alpha2 Glob SerPl Elph-Mcnc: 0.7 g/dL (ref 0.4–1.0)
B-Globulin SerPl Elph-Mcnc: 0.9 g/dL (ref 0.7–1.3)
Gamma Glob SerPl Elph-Mcnc: 0.5 g/dL (ref 0.4–1.8)
Globulin, Total: 2.3 g/dL (ref 2.2–3.9)
IgA: 59 mg/dL — ABNORMAL LOW (ref 64–422)
IgG (Immunoglobin G), Serum: 617 mg/dL (ref 586–1602)
IgM (Immunoglobulin M), Srm: 13 mg/dL — ABNORMAL LOW (ref 26–217)
M Protein SerPl Elph-Mcnc: 0.2 g/dL — ABNORMAL HIGH
Total Protein ELP: 5.7 g/dL — ABNORMAL LOW (ref 6.0–8.5)

## 2023-05-04 MED ORDER — POMALIDOMIDE 2 MG PO CAPS
2.0000 mg | ORAL_CAPSULE | Freq: Every day | ORAL | 0 refills | Status: DC
Start: 2023-05-04 — End: 2023-05-20

## 2023-05-05 ENCOUNTER — Encounter: Payer: Self-pay | Admitting: Hematology and Oncology

## 2023-05-08 ENCOUNTER — Inpatient Hospital Stay: Payer: Medicare Other

## 2023-05-08 VITALS — BP 113/50 | HR 59 | Temp 98.0°F | Resp 18 | Wt 134.0 lb

## 2023-05-08 DIAGNOSIS — C9000 Multiple myeloma not having achieved remission: Secondary | ICD-10-CM | POA: Diagnosis not present

## 2023-05-08 DIAGNOSIS — Z7982 Long term (current) use of aspirin: Secondary | ICD-10-CM | POA: Diagnosis not present

## 2023-05-08 DIAGNOSIS — M25552 Pain in left hip: Secondary | ICD-10-CM | POA: Diagnosis not present

## 2023-05-08 DIAGNOSIS — M549 Dorsalgia, unspecified: Secondary | ICD-10-CM | POA: Diagnosis not present

## 2023-05-08 DIAGNOSIS — E162 Hypoglycemia, unspecified: Secondary | ICD-10-CM | POA: Diagnosis not present

## 2023-05-08 DIAGNOSIS — Z5112 Encounter for antineoplastic immunotherapy: Secondary | ICD-10-CM | POA: Diagnosis not present

## 2023-05-08 LAB — CMP (CANCER CENTER ONLY)
ALT: 26 U/L (ref 0–44)
AST: 23 U/L (ref 15–41)
Albumin: 4 g/dL (ref 3.5–5.0)
Alkaline Phosphatase: 70 U/L (ref 38–126)
Anion gap: 4 — ABNORMAL LOW (ref 5–15)
BUN: 21 mg/dL (ref 8–23)
CO2: 28 mmol/L (ref 22–32)
Calcium: 9.6 mg/dL (ref 8.9–10.3)
Chloride: 106 mmol/L (ref 98–111)
Creatinine: 0.78 mg/dL (ref 0.44–1.00)
GFR, Estimated: >60 mL/min (ref >60)
Glucose, Bld: 92 mg/dL (ref 70–99)
Potassium: 5 mmol/L (ref 3.5–5.1)
Sodium: 138 mmol/L (ref 135–145)
Total Bilirubin: 0.7 mg/dL (ref 0.3–1.2)
Total Protein: 6 g/dL — ABNORMAL LOW (ref 6.5–8.1)

## 2023-05-08 LAB — LACTATE DEHYDROGENASE: LDH: 155 U/L (ref 98–192)

## 2023-05-08 LAB — CBC WITH DIFFERENTIAL (CANCER CENTER ONLY)
Abs Immature Granulocytes: 0.01 10*3/uL (ref 0.00–0.07)
Basophils Absolute: 0.1 10*3/uL (ref 0.0–0.1)
Basophils Relative: 2 %
Eosinophils Absolute: 0.3 10*3/uL (ref 0.0–0.5)
Eosinophils Relative: 5 %
HCT: 37.6 % (ref 36.0–46.0)
Hemoglobin: 12.3 g/dL (ref 12.0–15.0)
Immature Granulocytes: 0 %
Lymphocytes Relative: 17 %
Lymphs Abs: 1 10*3/uL (ref 0.7–4.0)
MCH: 29.6 pg (ref 26.0–34.0)
MCHC: 32.7 g/dL (ref 30.0–36.0)
MCV: 90.4 fL (ref 80.0–100.0)
Monocytes Absolute: 0.5 10*3/uL (ref 0.1–1.0)
Monocytes Relative: 9 %
Neutro Abs: 3.7 10*3/uL (ref 1.7–7.7)
Neutrophils Relative %: 67 %
Platelet Count: 252 10*3/uL (ref 150–400)
RBC: 4.16 MIL/uL (ref 3.87–5.11)
RDW: 15.8 % — ABNORMAL HIGH (ref 11.5–15.5)
WBC Count: 5.5 10*3/uL (ref 4.0–10.5)
nRBC: 0 % (ref 0.0–0.2)

## 2023-05-08 MED ORDER — ACETAMINOPHEN 325 MG PO TABS
650.0000 mg | ORAL_TABLET | Freq: Once | ORAL | Status: AC
  Administered 2023-05-08: 650 mg via ORAL
  Filled 2023-05-08: qty 2

## 2023-05-08 MED ORDER — DARATUMUMAB-HYALURONIDASE-FIHJ 1800-30000 MG-UT/15ML ~~LOC~~ SOLN
1800.0000 mg | Freq: Once | SUBCUTANEOUS | Status: AC
  Administered 2023-05-08: 1800 mg via SUBCUTANEOUS
  Filled 2023-05-08: qty 15

## 2023-05-08 MED ORDER — CETIRIZINE HCL 10 MG PO TABS
10.0000 mg | ORAL_TABLET | Freq: Every day | ORAL | Status: DC
  Administered 2023-05-08: 10 mg via ORAL
  Filled 2023-05-08: qty 1

## 2023-05-08 MED ORDER — DEXAMETHASONE 4 MG PO TABS
20.0000 mg | ORAL_TABLET | Freq: Once | ORAL | Status: AC
  Administered 2023-05-08: 20 mg via ORAL
  Filled 2023-05-08: qty 5

## 2023-05-08 NOTE — Progress Notes (Signed)
Pt declined Benadryl today because it makes her too drowsy. Per Leonides Schanz MD ok to substitute Zirtec (per note on 6/7).

## 2023-05-08 NOTE — Patient Instructions (Signed)
[facility]  Discharge Instructions: Thank you for choosing [facility] to provide your oncology and hematology care.   If you have a lab appointment with the Cancer Center, please go directly to the Cancer Center and check in at the registration area.   Wear comfortable clothing and clothing appropriate for easy access to any Portacath or PICC line.   We strive to give you quality time with your provider. You may need to reschedule your appointment if you arrive late (15 or more minutes).  Arriving late affects you and other patients whose appointments are after yours.  Also, if you miss three or more appointments without notifying the office, you may be dismissed from the clinic at the provider's discretion.      For prescription refill requests, have your pharmacy contact our office and allow 72 hours for refills to be completed.    Today you received the following chemotherapy and/or immunotherapy agents: Dara-faspro.       To help prevent nausea and vomiting after your treatment, we encourage you to take your nausea medication as directed.  BELOW ARE SYMPTOMS THAT SHOULD BE REPORTED IMMEDIATELY: *FEVER GREATER THAN 100.4 F (38 C) OR HIGHER *CHILLS OR SWEATING *NAUSEA AND VOMITING THAT IS NOT CONTROLLED WITH YOUR NAUSEA MEDICATION *UNUSUAL SHORTNESS OF BREATH *UNUSUAL BRUISING OR BLEEDING *URINARY PROBLEMS (pain or burning when urinating, or frequent urination) *BOWEL PROBLEMS (unusual diarrhea, constipation, pain near the anus) TENDERNESS IN MOUTH AND THROAT WITH OR WITHOUT PRESENCE OF ULCERS (sore throat, sores in mouth, or a toothache) UNUSUAL RASH, SWELLING OR PAIN  UNUSUAL VAGINAL DISCHARGE OR ITCHING   Items with * indicate a potential emergency and should be followed up as soon as possible or go to the Emergency Department if any problems should occur.  Please show the CHEMOTHERAPY ALERT CARD or IMMUNOTHERAPY ALERT CARD at  check-in to the Emergency Department and triage nurse.  Should you have questions after your visit or need to cancel or reschedule your appointment, please contact [facility]  Dept: [phone]  and follow the prompts.  Office hours are 8:00 a.m. to 4:30 p.m. Monday - Friday. Please note that voicemails left after 4:00 p.m. may not be returned until the following business day.  We are closed weekends and major holidays. You have access to a nurse at all times for urgent questions. Please call the main number to the clinic Dept: [phone] and follow the prompts.   For any non-urgent questions, you may also contact your provider using MyChart. We now offer e-Visits for anyone 38 and older to request care online for non-urgent symptoms. For details visit [email or web address].   Also download the MyChart app! Go to the app store, search "MyChart", open the app, select , and log in with your MyChart username and password.

## 2023-05-11 ENCOUNTER — Ambulatory Visit (INDEPENDENT_AMBULATORY_CARE_PROVIDER_SITE_OTHER): Payer: Medicare Other | Admitting: Family Medicine

## 2023-05-11 VITALS — BP 120/60 | HR 84 | Temp 97.9°F | Wt 138.2 lb

## 2023-05-11 DIAGNOSIS — E162 Hypoglycemia, unspecified: Secondary | ICD-10-CM

## 2023-05-11 MED ORDER — BLOOD GLUCOSE TEST VI STRP
1.0000 | ORAL_STRIP | 2 refills | Status: AC | PRN
Start: 2023-05-11 — End: 2023-06-10

## 2023-05-11 MED ORDER — BLOOD GLUCOSE MONITORING SUPPL DEVI
1.0000 | 0 refills | Status: DC | PRN
Start: 2023-05-11 — End: 2024-06-30

## 2023-05-11 MED ORDER — LANCETS MISC. MISC
1.0000 | 0 refills | Status: AC | PRN
Start: 2023-05-11 — End: 2023-06-10

## 2023-05-11 MED ORDER — LANCET DEVICE MISC
1.0000 | 0 refills | Status: AC | PRN
Start: 2023-05-11 — End: 2023-06-10

## 2023-05-11 NOTE — Progress Notes (Signed)
Subjective:  Patient ID: Doris Wilkerson, female    DOB: 01/23/1946  Age: 77 y.o. MRN: [id number]  CC:  Chief Complaint  Patient presents with   Hyperglycemia    Notes has had a few lows but doing well so far, since a few weeks ago, feels she has identified the cause of her lows at this time     HPI Doris Wilkerson presents for   Hypoglycemia Follow-up from June 3 visit.  Reading of 74, 63, 86.  Possibly symptomatic of blood sugar 63, felt lightheaded prior to treatment that day and unsteady.  Improved with snack.  Continuous glucose monitor use since last visit.  More aware of certain foods and impact on blood sugar. Spiking and dropping with some cereals.  Lowest reading since last visit: 68 - on way to treatment. Had breakfast that day. Asymptomatic at that time. Drank some juice, blood sugar 92 when drawn at treatment. One other lower reading high 60's, no apparent symptoms at that time. Some readings 100-150 on typical day.    History Patient Active Problem List   Diagnosis Date Noted   Mixed hyperlipidemia 01/11/2023   Essential hypertension 01/11/2023   Physical debility 03/21/2021   Dizziness 03/21/2021   Vertigo 03/21/2021   Anemia due to antineoplastic chemotherapy 03/21/2021   Skin changes related to chemotherapy 03/21/2021   Other constipation 03/21/2021   Multiple myeloma not having achieved remission (HCC) 01/27/2021   Hordeolum externum of left lower eyelid 06/28/2018   CAD (coronary artery disease) 12/09/2011   Dyslipidemia 12/09/2011   Osteopenia 12/09/2011   Hypothyroid 12/09/2011   Asthma 12/09/2011   Past Medical History:  Diagnosis Date   Asthma    Cataract    GERD (gastroesophageal reflux disease)    Hyperlipidemia    Hypertension    Thyroid disease    Past Surgical History:  Procedure Laterality Date   Cataract Surgery     CORONARY ANGIOPLASTY WITH STENT PLACEMENT     EYE SURGERY     ORTHOPEDIC SURGERY  2012   TONSILLECTOMY  1951    Allergies  Allergen Reactions   Poison Ivy Extract Swelling   Plavix [Clopidogrel Bisulfate]    Other     seasonal   Prior to Admission medications   Medication Sig Start Date End Date Taking? Authorizing Provider  acyclovir (ZOVIRAX) 400 MG tablet Take 1 tablet (400 mg total) by mouth 2 (two) times daily. 03/27/23  Yes Briant Cedar, PA-C  aspirin 81 MG tablet Take 81 mg by mouth daily.    Yes Dois Davenport, MD  atorvastatin (LIPITOR) 80 MG tablet TAKE 1 TABLET(80 MG) BY MOUTH DAILY AT 6 PM 01/08/23  Yes Shade Flood, MD  benazepril (LOTENSIN) 10 MG tablet TAKE 1 TABLET(10 MG) BY MOUTH DAILY 01/08/23  Yes Shade Flood, MD  calcium carbonate (TUMS - DOSED IN MG ELEMENTAL CALCIUM) 500 MG chewable tablet Chew 1 tablet by mouth daily. Daily PRN   Yes [provider]  cholecalciferol (VITAMIN D3) 25 MCG (1000 UNIT) tablet Take 2,000 Units by mouth daily.   Yes [provider]  gabapentin (NEURONTIN) 100 MG capsule TAKE 1 CAPSULE(100 MG) BY MOUTH TWICE DAILY AS NEEDED 04/14/23  Yes Shade Flood, MD  levothyroxine (SYNTHROID) 25 MCG tablet TAKE 1 TABLET(25 MCG) BY MOUTH DAILY BEFORE BREAKFAST 12/24/22  Yes Shade Flood, MD  Magnesium 100 MG TABS Take 100 mg by mouth daily.   Yes [provider]  polyethylene  glycol (MIRALAX / GLYCOLAX) 17 g packet Take 17 g by mouth daily as needed.   Yes [provider]  pomalidomide (POMALYST) 2 MG capsule Take 1 capsule (2 mg total) by mouth daily. Take for 21 days, then none for 7 days. Repeat every 28 days. Siri Cole # [id number] Date Obtained 05/04/23 05/04/23  Yes Jaci Standard, MD   Social History   Socioeconomic History   Marital status: Married    Spouse name: Not on file   Number of children: 0   Years of education: Not on file   Highest education level: Master's degree (e.g., MA, MS, MEng, MEd, MSW, MBA)  Occupational History   Occupation: unemployed  Tobacco Use   Smoking status:  Never   Smokeless tobacco: Never  Vaping Use   Vaping Use: Never used  Substance and Sexual Activity   Alcohol use: Yes    Alcohol/week: 1.0 standard drink of alcohol    Types: 1 Glasses of wine per week    Comment: occassionally   Drug use: No   Sexual activity: Not Currently  Other Topics Concern   Not on file  Social History Narrative   Married. Education: college. Pt does exercise-   Social Determinants of Health   Financial Resource Strain: Low Risk  (04/24/2023)   Overall Financial Resource Strain (CARDIA)    Difficulty of Paying Living Expenses: Not hard at all  Food Insecurity: No Food Insecurity (04/24/2023)   Hunger Vital Sign    Worried About Running Out of Food in the Last Year: Never true    Ran Out of Food in the Last Year: Never true  Transportation Needs: No Transportation Needs (04/24/2023)   PRAPARE - Administrator, Civil Service (Medical): No    Lack of Transportation (Non-Medical): No  Physical Activity: Insufficiently Active (04/24/2023)   Exercise Vital Sign    Days of Exercise per Week: 7 days    Minutes of Exercise per Session: 20 min  Stress: No Stress Concern Present (04/24/2023)   Doris Wilkerson [employer] Occupational [employer] - Occupational Stress Questionnaire    Feeling of Stress : Only a little  Social Connections: Moderately Integrated (04/24/2023)   Social Connection and Isolation Panel [NHANES]    Frequency of Communication with Friends and Family: Twice a week    Frequency of Social Gatherings with Friends and Family: Once a week    Attends Religious Services: Never    Database administrator or Organizations: Yes    Attends Engineer, structural: More than 4 times per year    Marital Status: Married  Catering manager Violence: Not At Risk (04/08/2023)   Humiliation, Afraid, Rape, and Kick questionnaire    Fear of Current or Ex-Partner: No    Emotionally Abused: No    Physically Abused: No    Sexually Abused: No    Review of  Systems  Per HPI Objective:   Vitals:   05/11/23 1349  BP: 120/60  Pulse: 84  Temp: 97.9 F (36.6 C)  TempSrc: Temporal  SpO2: 94%  Weight: 138 lb 3.2 oz (62.7 kg)     Physical Exam Constitutional:      General: She is not in acute distress.    Appearance: Normal appearance. She is well-developed.  HENT:     Head: Normocephalic and atraumatic.  Cardiovascular:     Rate and Rhythm: Normal rate.  Pulmonary:     Effort: Pulmonary effort is normal.  Neurological:  Mental Status: She is alert and oriented to person, place, and time.  Psychiatric:        Mood and Affect: Mood normal.        Assessment & Plan:  Arthena Boetcher is a 77 y.o. female . Hypoglycemia - Plan: Ambulatory referral to Endocrinology, Blood Glucose Monitoring Suppl DEVI, Glucose Blood (BLOOD GLUCOSE TEST STRIPS) STRP, Lancet Device MISC, Lancets Misc. MISC Borderline hypoglycemia with a few symptomatic episodes as above.  Overall data from CGM indicates normal readings.  Most recent low did not appear to be related to fasting state or time from meal.  Question impact from her medications, current treatments for multiple myeloma.  Over-the-counter glucometer recommended for any time she may feel hypoglycemic, carry sugar or glucose with her if needed, handout given, and refer to endocrinology to decide on further workup with infrequent symptoms.  RTC/ER precautions given.  Meds ordered this encounter  Medications   Blood Glucose Monitoring Suppl DEVI    Sig: 1 each by Does not apply route as needed (symptoms of low blood sugar.). May substitute to any manufacturer covered by patient's insurance.    Dispense:  1 each    Refill:  0   Glucose Blood (BLOOD GLUCOSE TEST STRIPS) STRP    Sig: 1 each by In Vitro route as needed. May substitute to any manufacturer covered by patient's insurance.    Dispense:  10 strip    Refill:  2   Lancet Device MISC    Sig: 1 each by Does not apply route as needed.  May substitute to any manufacturer covered by patient's insurance.    Dispense:  1 each    Refill:  0   Lancets Misc. MISC    Sig: 1 each by Does not apply route as needed. May substitute to any manufacturer covered by patient's insurance.    Dispense:  10 each    Refill:  0   Patient Instructions  I will refer you to endocrinology to decide if other testing indicated with these few lower readings. See info below, but most of your blood sugars appear normal. Check your blood sugar with fingerstick device if you are feeling symptoms of low blood sugars. Carry candy or glucose gel with you if you do drop low.   Return to the clinic or go to the nearest emergency room if any of your symptoms worsen or new symptoms occur.    Hypoglycemia Hypoglycemia occurs when the level of sugar (glucose) in the blood is too low. Hypoglycemia can happen in people who have or do not have diabetes. It can develop quickly, and it can be a medical emergency. For most people, a blood glucose level below 70 mg/dL (3.9 mmol/L) is considered hypoglycemia. Glucose is a type of sugar that provides the body's main source of energy. Certain hormones (insulin and glucagon) control the level of glucose in the blood. Insulin lowers blood glucose, and glucagon raises blood glucose. Hypoglycemia can result from having too much insulin in the bloodstream, or from not eating enough food that contains glucose. You may also have reactive hypoglycemia, which happens within 4 hours after eating a meal. What are the causes? Hypoglycemia occurs most often in people who have diabetes and may be caused by: Diabetes medicine. Not eating enough, or not eating often enough. Increased physical activity. Drinking alcohol on an empty stomach. If you do not have diabetes, hypoglycemia may be caused by: A tumor in the pancreas. Not eating enough, or not  eating for long periods at a time (fasting). A severe infection or illness. Problems  after having bariatric surgery. Organ failure, such as kidney or liver failure. Certain medicines. What increases the risk? Hypoglycemia is more likely to develop in people who: Have diabetes and take medicines to lower blood glucose. Abuse alcohol. Have a severe illness. What are the signs or symptoms? Symptoms vary depending on whether the condition is mild, moderate, or severe. Mild hypoglycemia Hunger. Sweating and feeling clammy. Dizziness or feeling light-headed. Sleepiness or restless sleep. Nausea. Increased heart rate. Headache. Blurry vision. Mood changes, such as irritability or anxiety. Tingling or numbness around the mouth, lips, or tongue. Moderate hypoglycemia Confusion and poor judgment. Behavior changes. Weakness. Irregular heartbeat. A change in coordination. Severe hypoglycemia Severe hypoglycemia is a medical emergency. It can cause: Fainting. Seizures. Loss of consciousness (coma). Death. How is this diagnosed? Hypoglycemia is diagnosed with a blood test to measure your blood glucose level. This blood test is done while you are having symptoms. Your health care provider may also do a physical exam and review your medical history. How is this treated? This condition can be treated by immediately eating or drinking something that contains sugar with 15 grams of fast-acting carbohydrate, such as: 4 oz (120 mL) of fruit juice. 4 oz (120 mL) of regular soda (not diet soda). Several pieces of hard candy. Check food labels to find out how many pieces to eat for 15 grams. 1 Tbsp (15 mL) of sugar or honey. 4 glucose tablets. 1 tube of glucose gel. Treating hypoglycemia if you have diabetes If you are alert and able to swallow safely, follow the 15:15 rule: Take 15 grams of a fast-acting carbohydrate. Talk with your health care provider about how much you should take. Options for getting 15 grams of fast-acting carbohydrate include: Glucose tablets (take 4  tablets). Several pieces of hard candy. Check food labels to find out how many pieces to eat for 15 grams. 4 oz (120 mL) of fruit juice. 4 oz (120 mL) of regular soda (not diet soda). 1 Tbsp (15 mL) of sugar or honey. 1 tube of glucose gel. Check your blood glucose 15 minutes after you take the carbohydrate. If the repeat blood glucose level is still at or below 70 mg/dL (3.9 mmol/L), take 15 grams of a carbohydrate again. If your blood glucose level does not increase above 70 mg/dL (3.9 mmol/L) after 3 tries, seek emergency medical care. After your blood glucose level returns to normal, eat a meal or a snack within 1 hour.  Treating severe hypoglycemia Severe hypoglycemia is when your blood glucose level is below 54 mg/dL (3 mmol/L). Severe hypoglycemia is a medical emergency. Get medical help right away. If you have severe hypoglycemia and you cannot eat or drink, you will need to be given glucagon. A family member or close friend should learn how to check your blood glucose and how to give you glucagon. Ask your health care provider if you need to have an emergency glucagon kit available. Severe hypoglycemia may need to be treated in a hospital. The treatment may include getting glucose through an IV. You may also need treatment for the cause of your hypoglycemia. Follow these instructions at home:  General instructions Take over-the-counter and prescription medicines only as told by your health care provider. Monitor your blood glucose as told by your health care provider. If you drink alcohol: Limit how much you have to: 0-1 drink a day for women who are  not pregnant. 0-2 drinks a day for men. Know how much alcohol is in your drink. In the [place] one drink equals one 12 oz bottle of beer (355 mL), one 5 oz glass of wine (148 mL), or one 1 oz glass of hard liquor (44 mL). Be sure to eat food along with drinking alcohol. Be aware that alcohol is absorbed quickly and may have lingering  effects that may result in hypoglycemia later. Be sure to do ongoing glucose monitoring. Keep all follow-up visits. This is important. If you have diabetes: Always have a fast-acting carbohydrate (15 grams) option with you to treat low blood glucose. Follow your diabetes management plan as directed by your health care provider. Make sure you: Know the symptoms of hypoglycemia. It is important to treat it right away to prevent it from becoming severe. Check your blood glucose as often as told. Always check before and after exercise. Always check your blood glucose before you drive a motorized vehicle. Take your medicines as told. Follow your meal plan. Eat on time, and do not skip meals. Share your diabetes management plan with people in your workplace, school, and household. Carry a medical alert card or wear medical alert jewelry. Where to find more information American Diabetes Association: www.diabetes.org Contact a health care provider if: You have problems keeping your blood glucose in your target range. You have frequent episodes of hypoglycemia. Get help right away if: You continue to have hypoglycemia symptoms after eating or drinking something that contains 15 grams of fast-acting carbohydrate, and you cannot get your blood glucose above 70 mg/dL (3.9 mmol/L) while following the 15:15 rule. Your blood glucose is below 54 mg/dL (3 mmol/L). You have a seizure. You faint. These symptoms may represent a serious problem that is an emergency. Do not wait to see if the symptoms will go away. Get medical help right away. Call your local emergency services (911 in the [place] Do not drive yourself to the hospital. Summary Hypoglycemia occurs when the level of sugar (glucose) in the blood is too low. Hypoglycemia can happen in people who have or do not have diabetes. It can develop quickly, and it can be a medical emergency. Make sure you know the symptoms of hypoglycemia and how to treat  it. Always have a fast-acting carbohydrate option with you to treat low blood sugar. This information is not intended to replace advice given to you by your health care provider. Make sure you discuss any questions you have with your health care provider. Document Revised: 10/11/2020 Document Reviewed: 10/11/2020 Elsevier Patient Education  2024 [employer] Inc.      Signed,   Meredith Staggers, MD [facility], [facility] 05/12/23 9:38 AM

## 2023-05-11 NOTE — Patient Instructions (Addendum)
I will refer you to endocrinology to decide if other testing indicated with these few lower readings. See info below, but most of your blood sugars appear normal. Check your blood sugar with fingerstick device if you are feeling symptoms of low blood sugars. Carry candy or glucose gel with you if you do drop low.   Return to the clinic or go to the nearest emergency room if any of your symptoms worsen or new symptoms occur.    Hypoglycemia Hypoglycemia occurs when the level of sugar (glucose) in the blood is too low. Hypoglycemia can happen in people who have or do not have diabetes. It can develop quickly, and it can be a medical emergency. For most people, a blood glucose level below 70 mg/dL (3.9 mmol/L) is considered hypoglycemia. Glucose is a type of sugar that provides the body's main source of energy. Certain hormones (insulin and glucagon) control the level of glucose in the blood. Insulin lowers blood glucose, and glucagon raises blood glucose. Hypoglycemia can result from having too much insulin in the bloodstream, or from not eating enough food that contains glucose. You may also have reactive hypoglycemia, which happens within 4 hours after eating a meal. What are the causes? Hypoglycemia occurs most often in people who have diabetes and may be caused by: Diabetes medicine. Not eating enough, or not eating often enough. Increased physical activity. Drinking alcohol on an empty stomach. If you do not have diabetes, hypoglycemia may be caused by: A tumor in the pancreas. Not eating enough, or not eating for long periods at a time (fasting). A severe infection or illness. Problems after having bariatric surgery. Organ failure, such as kidney or liver failure. Certain medicines. What increases the risk? Hypoglycemia is more likely to develop in people who: Have diabetes and take medicines to lower blood glucose. Abuse alcohol. Have a severe illness. What are the signs or  symptoms? Symptoms vary depending on whether the condition is mild, moderate, or severe. Mild hypoglycemia Hunger. Sweating and feeling clammy. Dizziness or feeling light-headed. Sleepiness or restless sleep. Nausea. Increased heart rate. Headache. Blurry vision. Mood changes, such as irritability or anxiety. Tingling or numbness around the mouth, lips, or tongue. Moderate hypoglycemia Confusion and poor judgment. Behavior changes. Weakness. Irregular heartbeat. A change in coordination. Severe hypoglycemia Severe hypoglycemia is a medical emergency. It can cause: Fainting. Seizures. Loss of consciousness (coma). Death. How is this diagnosed? Hypoglycemia is diagnosed with a blood test to measure your blood glucose level. This blood test is done while you are having symptoms. Your health care provider may also do a physical exam and review your medical history. How is this treated? This condition can be treated by immediately eating or drinking something that contains sugar with 15 grams of fast-acting carbohydrate, such as: 4 oz (120 mL) of fruit juice. 4 oz (120 mL) of regular soda (not diet soda). Several pieces of hard candy. Check food labels to find out how many pieces to eat for 15 grams. 1 Tbsp (15 mL) of sugar or honey. 4 glucose tablets. 1 tube of glucose gel. Treating hypoglycemia if you have diabetes If you are alert and able to swallow safely, follow the 15:15 rule: Take 15 grams of a fast-acting carbohydrate. Talk with your health care provider about how much you should take. Options for getting 15 grams of fast-acting carbohydrate include: Glucose tablets (take 4 tablets). Several pieces of hard candy. Check food labels to find out how many pieces to eat for 15  grams. 4 oz (120 mL) of fruit juice. 4 oz (120 mL) of regular soda (not diet soda). 1 Tbsp (15 mL) of sugar or honey. 1 tube of glucose gel. Check your blood glucose 15 minutes after you take the  carbohydrate. If the repeat blood glucose level is still at or below 70 mg/dL (3.9 mmol/L), take 15 grams of a carbohydrate again. If your blood glucose level does not increase above 70 mg/dL (3.9 mmol/L) after 3 tries, seek emergency medical care. After your blood glucose level returns to normal, eat a meal or a snack within 1 hour.  Treating severe hypoglycemia Severe hypoglycemia is when your blood glucose level is below 54 mg/dL (3 mmol/L). Severe hypoglycemia is a medical emergency. Get medical help right away. If you have severe hypoglycemia and you cannot eat or drink, you will need to be given glucagon. A family member or close friend should learn how to check your blood glucose and how to give you glucagon. Ask your health care provider if you need to have an emergency glucagon kit available. Severe hypoglycemia may need to be treated in a hospital. The treatment may include getting glucose through an IV. You may also need treatment for the cause of your hypoglycemia. Follow these instructions at home:  General instructions Take over-the-counter and prescription medicines only as told by your health care provider. Monitor your blood glucose as told by your health care provider. If you drink alcohol: Limit how much you have to: 0-1 drink a day for women who are not pregnant. 0-2 drinks a day for men. Know how much alcohol is in your drink. In the [place] one drink equals one 12 oz bottle of beer (355 mL), one 5 oz glass of wine (148 mL), or one 1 oz glass of hard liquor (44 mL). Be sure to eat food along with drinking alcohol. Be aware that alcohol is absorbed quickly and may have lingering effects that may result in hypoglycemia later. Be sure to do ongoing glucose monitoring. Keep all follow-up visits. This is important. If you have diabetes: Always have a fast-acting carbohydrate (15 grams) option with you to treat low blood glucose. Follow your diabetes management plan as directed  by your health care provider. Make sure you: Know the symptoms of hypoglycemia. It is important to treat it right away to prevent it from becoming severe. Check your blood glucose as often as told. Always check before and after exercise. Always check your blood glucose before you drive a motorized vehicle. Take your medicines as told. Follow your meal plan. Eat on time, and do not skip meals. Share your diabetes management plan with people in your workplace, school, and household. Carry a medical alert card or wear medical alert jewelry. Where to find more information American Diabetes Association: www.diabetes.org Contact a health care provider if: You have problems keeping your blood glucose in your target range. You have frequent episodes of hypoglycemia. Get help right away if: You continue to have hypoglycemia symptoms after eating or drinking something that contains 15 grams of fast-acting carbohydrate, and you cannot get your blood glucose above 70 mg/dL (3.9 mmol/L) while following the 15:15 rule. Your blood glucose is below 54 mg/dL (3 mmol/L). You have a seizure. You faint. These symptoms may represent a serious problem that is an emergency. Do not wait to see if the symptoms will go away. Get medical help right away. Call your local emergency services (911 in the [place] Do not drive yourself to  the hospital. Summary Hypoglycemia occurs when the level of sugar (glucose) in the blood is too low. Hypoglycemia can happen in people who have or do not have diabetes. It can develop quickly, and it can be a medical emergency. Make sure you know the symptoms of hypoglycemia and how to treat it. Always have a fast-acting carbohydrate option with you to treat low blood sugar. This information is not intended to replace advice given to you by your health care provider. Make sure you discuss any questions you have with your health care provider. Document Revised: 10/11/2020 Document Reviewed:  10/11/2020 Elsevier Patient Education  2024 [employer].

## 2023-05-12 ENCOUNTER — Encounter: Payer: Self-pay | Admitting: Family Medicine

## 2023-05-13 NOTE — Progress Notes (Unsigned)
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 Concerning for multiple myeloma vs metastatic disease 12/31/2020: establish care with Dr. Leonides Schanz. UPEP showed marked Bence Jones protein, findings concerning for multiple myleoma 01/21/2021: bone marrow biopsy confirms multiple myeloma with atypical plasma cells representing 28% of all cells in the aspirate  02/01/2021: Cycle 1 Day 1 of VRd chemotherapy 02/22/2021: Cycle 2 Day 1 of VRd chemotherapy 03/15/2021: Cycle 3 Day 1 of VRd chemotherapy 04/05/2021: Cycle 4 Day 1 of VRd chemotherapy 04/26/2021: Cycle 5 Day 1 of VRd chemotherapy 05/17/2021: Cycle 6 Day 1 of VRd chemotherapy 06/07/2021:  Cycle 7 Day 1 of VRd chemotherapy 06/28/2021: Cycle 8 Day 1 of VRd chemotherapy 07/19/2021: start maintenance revlimid 10mg  PO daily.  03/26/2022: Cycle 1 Day 1 of Dara/Pom/Dex 04/24/2023: Cycle 2 Day 1 of Dara/Pom/Dex  Interval History:  Doris Wilkerson 77 y.o. female with medical history significant for lambda light chain multiple myeloma who presents for a follow up visit. She was last seen on 05/01/2023. In the interim, she started Dara/Pom/Dex. She is due for Cycle 2, Day 22 today.   On exam today Doris Wilkerson reports she is "over concerns about toxicity".  She notes that she is delighted to hear that her ANC is holding up steadily despite continuous Pomalyst 2 mg dose.  She reports that she has been walking and feeling well overall.  She notes that taking the Benadryl out of her regimen and replacing with sertraline has been excellent as she takes a brief nap on Friday  afternoon that is not "wiped out for the whole day".  She notes that she is not having any nausea, vomiting, or diarrhea but the neuropathy persists as a "nuisance".  She does that she is not having any of the bone pain that she was previously but she says for a long period of time to become sore.  She is doing her best to try to keep up with hydration.  She denies fevers, chills, night sweats, shortness of breath, chest pain or cough. She has no other complaints. A full 10 point ROS is listed below.   Overall she is willing and able to proceed with Darzalex and Pomalyst therapy at this time.  MEDICAL HISTORY:  Past Medical History:  Diagnosis Date   Asthma    Cataract    GERD (gastroesophageal reflux disease)    Hyperlipidemia    Hypertension    Thyroid disease     SURGICAL HISTORY: Past Surgical History:  Procedure Laterality Date   Cataract Surgery     CORONARY ANGIOPLASTY WITH STENT PLACEMENT     EYE SURGERY     ORTHOPEDIC SURGERY  2012   TONSILLECTOMY  1951    SOCIAL HISTORY: Social History   Socioeconomic History   Marital status: Married    Spouse name: Not on file   Number of children: 0   Years of education: Not on file   Highest education level: Master's degree (e.g., MA, MS, MEng, MEd, MSW, MBA)  Occupational History   Occupation: unemployed  Tobacco Use   Smoking status: Never   Smokeless tobacco: Never  Vaping Use   Vaping Use:  Never used  Substance and Sexual Activity   Alcohol use: Yes    Alcohol/week: 1.0 standard drink of alcohol    Types: 1 Glasses of wine per week    Comment: occassionally   Drug use: No   Sexual activity: Not Currently  Other Topics Concern   Not on file  Social History Narrative   Married. Education: college. Pt does exercise-   Social Determinants of Health   Financial Resource Strain: Low Risk  (04/24/2023)   Overall Financial Resource Strain (CARDIA)    Difficulty of Paying Living Expenses: Not hard at all  Food  Insecurity: No Food Insecurity (04/24/2023)   Hunger Vital Sign    Worried About Running Out of Food in the Last Year: Never true    Ran Out of Food in the Last Year: Never true  Transportation Needs: No Transportation Needs (04/24/2023)   PRAPARE - Administrator, Civil Service (Medical): No    Lack of Transportation (Non-Medical): No  Physical Activity: Insufficiently Active (04/24/2023)   Exercise Vital Sign    Days of Exercise per Week: 7 days    Minutes of Exercise per Session: 20 min  Stress: No Stress Concern Present (04/24/2023)   [employer] of Occupational Health - Occupational Stress Questionnaire    Feeling of Stress : Only a little  Social Connections: Moderately Integrated (04/24/2023)   Social Connection and Isolation Panel [NHANES]    Frequency of Communication with Friends and Family: Twice a week    Frequency of Social Gatherings with Friends and Family: Once a week    Attends Religious Services: Never    Database administrator or Organizations: Yes    Attends Engineer, structural: More than 4 times per year    Marital Status: Married  Catering manager Violence: Not At Risk (04/08/2023)   Humiliation, Afraid, Rape, and Kick questionnaire    Fear of Current or Ex-Partner: No    Emotionally Abused: No    Physically Abused: No    Sexually Abused: No    FAMILY HISTORY: Family History  Problem Relation Age of Onset   Cancer Father    Heart disease Brother    Dementia Brother    Leukemia Brother    Breast cancer Neg Hx    Colon cancer Neg Hx    Esophageal cancer Neg Hx    Pancreatic cancer Neg Hx    Rectal cancer Neg Hx    Stomach cancer Neg Hx     ALLERGIES:  is allergic to poison ivy extract, plavix [clopidogrel bisulfate], and other.  MEDICATIONS:  Current Outpatient Medications  Medication Sig Dispense Refill   acyclovir (ZOVIRAX) 400 MG tablet Take 1 tablet (400 mg total) by mouth 2 (two) times daily. 60 tablet 3   aspirin 81 MG  tablet Take 81 mg by mouth daily.      atorvastatin (LIPITOR) 80 MG tablet TAKE 1 TABLET(80 MG) BY MOUTH DAILY AT 6 PM 90 tablet 1   benazepril (LOTENSIN) 10 MG tablet TAKE 1 TABLET(10 MG) BY MOUTH DAILY 90 tablet 1   Blood Glucose Monitoring Suppl DEVI 1 each by Does not apply route as needed (symptoms of low blood sugar.). May substitute to any manufacturer covered by patient's insurance. 1 each 0   calcium carbonate (TUMS - DOSED IN MG ELEMENTAL CALCIUM) 500 MG chewable tablet Chew 1 tablet by mouth daily. Daily PRN     cholecalciferol (VITAMIN D3) 25 MCG (1000 UNIT) tablet Take 2,000 Units  by mouth daily.     gabapentin (NEURONTIN) 100 MG capsule TAKE 1 CAPSULE(100 MG) BY MOUTH TWICE DAILY AS NEEDED 180 capsule 1   Glucose Blood (BLOOD GLUCOSE TEST STRIPS) STRP 1 each by In Vitro route as needed. May substitute to any manufacturer covered by patient's insurance. 10 strip 2   Lancet Device MISC 1 each by Does not apply route as needed. May substitute to any manufacturer covered by patient's insurance. 1 each 0   Lancets Misc. MISC 1 each by Does not apply route as needed. May substitute to any manufacturer covered by patient's insurance. 10 each 0   levothyroxine (SYNTHROID) 25 MCG tablet TAKE 1 TABLET(25 MCG) BY MOUTH DAILY BEFORE BREAKFAST 90 tablet 3   Magnesium 100 MG TABS Take 100 mg by mouth daily.     polyethylene glycol (MIRALAX / GLYCOLAX) 17 g packet Take 17 g by mouth daily as needed.     pomalidomide (POMALYST) 2 MG capsule Take 1 capsule (2 mg total) by mouth daily. Take for 21 days, then none for 7 days. Repeat every 28 days. Siri Cole # [id number] Date Obtained 05/04/23 21 capsule 0   No current facility-administered medications for this visit.    REVIEW OF SYSTEMS:   Constitutional: ( - ) fevers, ( - )  chills , ( - ) night sweats Eyes: ( - ) blurriness of vision, ( - ) double vision, ( - ) watery eyes Ears, nose, mouth, throat, and face: ( - ) mucositis, ( - ) sore  throat Respiratory: ( - ) cough, ( - ) dyspnea, ( - ) wheezes Cardiovascular: ( - ) palpitation, ( - ) chest discomfort, ( - ) lower extremity swelling Gastrointestinal:  ( - ) nausea, ( - ) heartburn, ( - ) change in bowel habits Skin: ( - ) abnormal skin rashes Lymphatics: ( - ) new lymphadenopathy, ( - ) easy bruising Neurological: ( +) numbness, ( - ) tingling, ( - ) new weaknesses Behavioral/Psych: ( - ) mood change, ( - ) new changes  All other systems were reviewed with the patient and are negative.  PHYSICAL EXAMINATION: ECOG PERFORMANCE STATUS: 1 - Symptomatic but completely ambulatory  Vitals:   05/14/23 0930  BP: (!) 146/83  Pulse: 70  Resp: 18  Temp: 98.3 F (36.8 C)  SpO2: 100%     Filed Weights   05/14/23 0930  Weight: 137 lb (62.1 kg)     GENERAL: well appearing elderly Caucasian female in NAD  SKIN: skin color, texture, turgor are normal, no rashes or significant lesions EYES: conjunctiva are pink and non-injected, sclera clear LUNGS: clear to auscultation and percussion with normal breathing effort HEART: regular rate & rhythm and no murmurs and trace lower extremity edema Musculoskeletal: no cyanosis of digits and no clubbing  PSYCH: alert & oriented x 3, fluent speech NEURO: no focal motor/sensory deficits  LABORATORY DATA:  I have reviewed the data as listed    Latest Ref Rng & Units 05/14/2023    9:09 AM 05/08/2023    7:40 AM 05/01/2023   10:34 AM  CBC  WBC 4.0 - 10.5 K/uL 5.2  5.5  5.5   Hemoglobin 12.0 - 15.0 g/dL 54.0  98.1  19.1   Hematocrit 36.0 - 46.0 % 37.7  37.6  38.1   Platelets 150 - 400 K/uL 241  252  282        Latest Ref Rng & Units 05/14/2023    9:09 AM 05/08/2023  7:40 AM 05/01/2023   10:34 AM  CMP  Glucose 70 - 99 mg/dL 82  92  98   BUN 8 - 23 mg/dL 18  21  22    Creatinine 0.44 - 1.00 mg/dL 4.09  8.11  9.14   Sodium 135 - 145 mmol/L 137  138  137   Potassium 3.5 - 5.1 mmol/L 4.0  5.0  4.0   Chloride 98 - 111 mmol/L 106   106  105   CO2 22 - 32 mmol/L 26  28  27    Calcium 8.9 - 10.3 mg/dL 9.5  9.6  9.9   Total Protein 6.5 - 8.1 g/dL 6.0  6.0  6.9   Total Bilirubin 0.3 - 1.2 mg/dL 0.7  0.7  0.7   Alkaline Phos 38 - 126 U/L 51  70  52   AST 15 - 41 U/L 21  23  23    ALT 0 - 44 U/L 23  26  28      Lab Results  Component Value Date   MPROTEIN 0.2 (H) 04/24/2023   MPROTEIN Not Observed 03/27/2023   MPROTEIN Not Observed 02/24/2023   Lab Results  Component Value Date   KPAFRELGTCHN 7.8 04/24/2023   KPAFRELGTCHN 14.7 03/27/2023   KPAFRELGTCHN 25.3 (H) 02/24/2023   LAMBDASER 71.5 (H) 04/24/2023   LAMBDASER 150.2 (H) 03/27/2023   LAMBDASER 133.6 (H) 02/24/2023   KAPLAMBRATIO 0.11 (L) 04/24/2023   KAPLAMBRATIO 0.10 (L) 03/27/2023   KAPLAMBRATIO 6.92 02/24/2023    RADIOGRAPHIC STUDIES: No results found.  ASSESSMENT & PLAN Doris Wilkerson 77 y.o. female with medical history significant for lambda light chain multiple myeloma who presents for a follow up visit. Doris Wilkerson is not a candidate for bone marrow transplant based on her advanced age.     # Lambda Light Chain Multiple Myeloma--VGPR on maintenance --diagnosis confirmed with bone marrow biopsy and urine protein analysis.   --patient has good functional status and would be an excellent candidate for VRd chemotherapy. I do not believe she would be a bone marrow transplant candidate based on her age.  --Received VRD chemotherapy from 02/01/2021-07/12/2021 then started maintenance Revlimid on 07/19/2021.  --Due to rising lambda light chain, recommended to switch to Daratumumab/Pomalyst/Dexamethasone starting today, 03/27/2023. --Started Cycle 1, Day 1 of Dara/Pom/Dex on 03/27/2023 Plan: --Due to start Cycle 2, Day 22 of Dara/Pom/Dex today --Labs today show white blood cell count 5.2, hemoglobin 12.5, MCV 90.2, and platelets of 241.  Creatinine and LFTs are within normal limits. -- Patient is currently on Pomalyst 2 mg p.o.  She did become neutropenic on this  dose.  Patient is interested in trying different schedules for this medication including 2 weeks on and 2 weeks off.  We could also consider decreasing dose to 1 mg p.o if 2 mg causes persistent cytopenias.  --Return to clinic weekly for labs/treatment and follow up visit in 2 weeks  #Hypoglycemia: --Glucose was 82, non-fasting --Patient is asymptomatic today. Encouraged to eat right before treatment and snacks every 2-3 hours.  --Patient will check her glucose levels on non-treatment days and follow up with PCP if non-fasting glucose is consistently 70 or below.  #Back Pain/Left Hip Pain  -- Currently well controlled on gabapentin and Tylenol --CT thoracic and lumbar spine showed no lytic lesions or sequelae of multiple myeloma --MRI thoracic and lumbar spine from 07/07/2022 for baseline imaging. Findings showed multiple bone lesions involving posterior T9 and T8, lumbar spine and upper sacrum consistent with multiple myeloma. No  extension into the canal. No pathologic fracture.  --continue follow up with Dr .Donalee Citrin, neurosugery --Continue to monitor  #Supportive Care --chemotherapy education complete  --zofran 8mg  q8H PRN and compazine 10mg  PO q6H for nausea -- acyclovir 400mg  PO BID for VCZ prophylaxis --zometa therapy last received on 02/24/2023. Next due in July 2024. -- no prescription pain medication required at this time.   No orders of the defined types were placed in this encounter.  All questions were answered. The patient knows to call the clinic with any problems, questions or concerns.  I have spent a total of 30 minutes minutes of face-to-face and non-face-to-face time, preparing to see the patient, performing a medically appropriate examination, counseling and educating the patient, documenting clinical information in the electronic health record.   Doris Barns, MD Department of Hematology/Oncology [facility] Phone:  [phone] Pager: [phone] Email: [email or web address]  05/14/2023 5:11 PM

## 2023-05-14 ENCOUNTER — Other Ambulatory Visit: Payer: Self-pay

## 2023-05-14 ENCOUNTER — Inpatient Hospital Stay: Payer: Medicare Other

## 2023-05-14 ENCOUNTER — Inpatient Hospital Stay (HOSPITAL_BASED_OUTPATIENT_CLINIC_OR_DEPARTMENT_OTHER): Payer: Medicare Other | Admitting: Hematology and Oncology

## 2023-05-14 ENCOUNTER — Other Ambulatory Visit: Payer: Self-pay | Admitting: Family Medicine

## 2023-05-14 VITALS — BP 110/58 | HR 56 | Resp 18

## 2023-05-14 VITALS — BP 146/83 | HR 70 | Temp 98.3°F | Resp 18 | Wt 137.0 lb

## 2023-05-14 DIAGNOSIS — E162 Hypoglycemia, unspecified: Secondary | ICD-10-CM | POA: Diagnosis not present

## 2023-05-14 DIAGNOSIS — C9000 Multiple myeloma not having achieved remission: Secondary | ICD-10-CM

## 2023-05-14 DIAGNOSIS — M549 Dorsalgia, unspecified: Secondary | ICD-10-CM | POA: Diagnosis not present

## 2023-05-14 DIAGNOSIS — Z7982 Long term (current) use of aspirin: Secondary | ICD-10-CM | POA: Diagnosis not present

## 2023-05-14 DIAGNOSIS — M898X9 Other specified disorders of bone, unspecified site: Secondary | ICD-10-CM

## 2023-05-14 DIAGNOSIS — M899 Disorder of bone, unspecified: Secondary | ICD-10-CM

## 2023-05-14 DIAGNOSIS — D6481 Anemia due to antineoplastic chemotherapy: Secondary | ICD-10-CM

## 2023-05-14 DIAGNOSIS — T451X5A Adverse effect of antineoplastic and immunosuppressive drugs, initial encounter: Secondary | ICD-10-CM | POA: Diagnosis not present

## 2023-05-14 DIAGNOSIS — Z5112 Encounter for antineoplastic immunotherapy: Secondary | ICD-10-CM | POA: Diagnosis not present

## 2023-05-14 DIAGNOSIS — M25552 Pain in left hip: Secondary | ICD-10-CM | POA: Diagnosis not present

## 2023-05-14 DIAGNOSIS — E782 Mixed hyperlipidemia: Secondary | ICD-10-CM

## 2023-05-14 DIAGNOSIS — I10 Essential (primary) hypertension: Secondary | ICD-10-CM

## 2023-05-14 LAB — CBC WITH DIFFERENTIAL (CANCER CENTER ONLY)
Abs Immature Granulocytes: 0.01 10*3/uL (ref 0.00–0.07)
Basophils Absolute: 0 10*3/uL (ref 0.0–0.1)
Basophils Relative: 1 %
Eosinophils Absolute: 0.3 10*3/uL (ref 0.0–0.5)
Eosinophils Relative: 5 %
HCT: 37.7 % (ref 36.0–46.0)
Hemoglobin: 12.5 g/dL (ref 12.0–15.0)
Immature Granulocytes: 0 %
Lymphocytes Relative: 19 %
Lymphs Abs: 1 10*3/uL (ref 0.7–4.0)
MCH: 29.9 pg (ref 26.0–34.0)
MCHC: 33.2 g/dL (ref 30.0–36.0)
MCV: 90.2 fL (ref 80.0–100.0)
Monocytes Absolute: 1.4 10*3/uL — ABNORMAL HIGH (ref 0.1–1.0)
Monocytes Relative: 26 %
Neutro Abs: 2.5 10*3/uL (ref 1.7–7.7)
Neutrophils Relative %: 49 %
Platelet Count: 241 10*3/uL (ref 150–400)
RBC: 4.18 MIL/uL (ref 3.87–5.11)
RDW: 16 % — ABNORMAL HIGH (ref 11.5–15.5)
WBC Count: 5.2 10*3/uL (ref 4.0–10.5)
nRBC: 0 % (ref 0.0–0.2)

## 2023-05-14 LAB — CMP (CANCER CENTER ONLY)
ALT: 23 U/L (ref 0–44)
AST: 21 U/L (ref 15–41)
Albumin: 3.9 g/dL (ref 3.5–5.0)
Alkaline Phosphatase: 51 U/L (ref 38–126)
Anion gap: 5 (ref 5–15)
BUN: 18 mg/dL (ref 8–23)
CO2: 26 mmol/L (ref 22–32)
Calcium: 9.5 mg/dL (ref 8.9–10.3)
Chloride: 106 mmol/L (ref 98–111)
Creatinine: 0.77 mg/dL (ref 0.44–1.00)
GFR, Estimated: >60 mL/min (ref >60)
Glucose, Bld: 82 mg/dL (ref 70–99)
Potassium: 4 mmol/L (ref 3.5–5.1)
Sodium: 137 mmol/L (ref 135–145)
Total Bilirubin: 0.7 mg/dL (ref 0.3–1.2)
Total Protein: 6 g/dL — ABNORMAL LOW (ref 6.5–8.1)

## 2023-05-14 LAB — LACTATE DEHYDROGENASE: LDH: 144 U/L (ref 98–192)

## 2023-05-14 MED ORDER — DIPHENHYDRAMINE HCL 25 MG PO CAPS
50.0000 mg | ORAL_CAPSULE | Freq: Once | ORAL | Status: AC
  Administered 2023-05-14: 50 mg via ORAL
  Filled 2023-05-14: qty 2

## 2023-05-14 MED ORDER — ACETAMINOPHEN 325 MG PO TABS
650.0000 mg | ORAL_TABLET | Freq: Once | ORAL | Status: AC
  Administered 2023-05-14: 650 mg via ORAL
  Filled 2023-05-14: qty 2

## 2023-05-14 MED ORDER — DEXAMETHASONE 4 MG PO TABS
20.0000 mg | ORAL_TABLET | Freq: Once | ORAL | Status: AC
  Administered 2023-05-14: 20 mg via ORAL
  Filled 2023-05-14: qty 5

## 2023-05-14 MED ORDER — CETIRIZINE HCL 10 MG PO TABS
10.0000 mg | ORAL_TABLET | Freq: Every day | ORAL | Status: DC
  Administered 2023-05-14: 10 mg via ORAL
  Filled 2023-05-14: qty 1

## 2023-05-14 MED ORDER — DARATUMUMAB-HYALURONIDASE-FIHJ 1800-30000 MG-UT/15ML ~~LOC~~ SOLN
1800.0000 mg | Freq: Once | SUBCUTANEOUS | Status: AC
  Administered 2023-05-14: 1800 mg via SUBCUTANEOUS
  Filled 2023-05-14: qty 15

## 2023-05-14 NOTE — Patient Instructions (Signed)
[place] [facility]  Discharge Instructions: Thank you for choosing [facility] to provide your oncology and hematology care.   If you have a lab appointment with the Cancer Center, please go directly to the Cancer Center and check in at the registration area.   Wear comfortable clothing and clothing appropriate for easy access to any Portacath or PICC line.   We strive to give you quality time with your provider. You may need to reschedule your appointment if you arrive late (15 or more minutes).  Arriving late affects you and other patients whose appointments are after yours.  Also, if you miss three or more appointments without notifying the office, you may be dismissed from the clinic at the provider's discretion.      For prescription refill requests, have your pharmacy contact our office and allow 72 hours for refills to be completed.    Today you received the following chemotherapy and/or immunotherapy agents: Dara Faspro.    To help prevent nausea and vomiting after your treatment, we encourage you to take your nausea medication as directed.  BELOW ARE SYMPTOMS THAT SHOULD BE REPORTED IMMEDIATELY: *FEVER GREATER THAN 100.4 F (38 C) OR HIGHER *CHILLS OR SWEATING *NAUSEA AND VOMITING THAT IS NOT CONTROLLED WITH YOUR NAUSEA MEDICATION *UNUSUAL SHORTNESS OF BREATH *UNUSUAL BRUISING OR BLEEDING *URINARY PROBLEMS (pain or burning when urinating, or frequent urination) *BOWEL PROBLEMS (unusual diarrhea, constipation, pain near the anus) TENDERNESS IN MOUTH AND THROAT WITH OR WITHOUT PRESENCE OF ULCERS (sore throat, sores in mouth, or a toothache) UNUSUAL RASH, SWELLING OR PAIN  UNUSUAL VAGINAL DISCHARGE OR ITCHING   Items with * indicate a potential emergency and should be followed up as soon as possible or go to the Emergency Department if any problems should occur.  Please show the CHEMOTHERAPY ALERT CARD or IMMUNOTHERAPY ALERT CARD at  check-in to the Emergency Department and triage nurse.  Should you have questions after your visit or need to cancel or reschedule your appointment, please contact [facility]  Dept: [phone]  and follow the prompts.  Office hours are 8:00 a.m. to 4:30 p.m. Monday - Friday. Please note that voicemails left after 4:00 p.m. may not be returned until the following business day.  We are closed weekends and major holidays. You have access to a nurse at all times for urgent questions. Please call the main number to the clinic Dept: [phone] and follow the prompts.   For any non-urgent questions, you may also contact your provider using MyChart. We now offer e-Visits for anyone 18 and older to request care online for non-urgent symptoms. For details visit [email or web address].   Also download the MyChart app! Go to the app store, search "MyChart", open the app, select , and log in with your MyChart username and password.   

## 2023-05-18 ENCOUNTER — Encounter: Payer: Self-pay | Admitting: Hematology and Oncology

## 2023-05-18 ENCOUNTER — Other Ambulatory Visit: Payer: Medicare Other

## 2023-05-18 ENCOUNTER — Ambulatory Visit: Payer: Medicare Other | Admitting: Hematology and Oncology

## 2023-05-20 ENCOUNTER — Telehealth: Payer: Self-pay | Admitting: Family Medicine

## 2023-05-20 ENCOUNTER — Encounter: Payer: Self-pay | Admitting: *Deleted

## 2023-05-20 ENCOUNTER — Other Ambulatory Visit: Payer: Self-pay | Admitting: *Deleted

## 2023-05-20 DIAGNOSIS — C9000 Multiple myeloma not having achieved remission: Secondary | ICD-10-CM

## 2023-05-20 MED ORDER — POMALIDOMIDE 1 MG PO CAPS: 1.0000 mg | ORAL_CAPSULE | Freq: Every day | ORAL | 0 refills | Status: DC

## 2023-05-20 NOTE — Telephone Encounter (Signed)
Placed in folder

## 2023-05-20 NOTE — Telephone Encounter (Signed)
Glucometer and test strips with history of hypoglycemia, not diabetes.  Paperwork completed.Paperwork completed and placed in fax bin at back nurse station

## 2023-05-20 NOTE — Telephone Encounter (Signed)
Caller name: [facility]  On DPR?: Yes  Call back number:   Provider they see: Shade Flood, MD  Reason for call:  Fax medication changes.  Charge sheet attached placed in front bin.

## 2023-05-22 ENCOUNTER — Inpatient Hospital Stay: Payer: Medicare Other

## 2023-05-22 ENCOUNTER — Telehealth: Payer: Self-pay | Admitting: Hematology and Oncology

## 2023-05-22 ENCOUNTER — Other Ambulatory Visit: Payer: Self-pay | Admitting: *Deleted

## 2023-05-22 VITALS — BP 111/66 | HR 58 | Temp 98.6°F | Resp 18 | Wt 136.5 lb

## 2023-05-22 DIAGNOSIS — E162 Hypoglycemia, unspecified: Secondary | ICD-10-CM | POA: Diagnosis not present

## 2023-05-22 DIAGNOSIS — C9000 Multiple myeloma not having achieved remission: Secondary | ICD-10-CM

## 2023-05-22 DIAGNOSIS — Z5112 Encounter for antineoplastic immunotherapy: Secondary | ICD-10-CM | POA: Diagnosis not present

## 2023-05-22 DIAGNOSIS — M25552 Pain in left hip: Secondary | ICD-10-CM | POA: Diagnosis not present

## 2023-05-22 DIAGNOSIS — M549 Dorsalgia, unspecified: Secondary | ICD-10-CM | POA: Diagnosis not present

## 2023-05-22 DIAGNOSIS — Z7982 Long term (current) use of aspirin: Secondary | ICD-10-CM | POA: Diagnosis not present

## 2023-05-22 LAB — CBC WITH DIFFERENTIAL (CANCER CENTER ONLY)
Abs Immature Granulocytes: 0.01 10*3/uL (ref 0.00–0.07)
Basophils Absolute: 0 10*3/uL (ref 0.0–0.1)
Basophils Relative: 2 %
Eosinophils Absolute: 0.4 10*3/uL (ref 0.0–0.5)
Eosinophils Relative: 14 %
HCT: 35.8 % — ABNORMAL LOW (ref 36.0–46.0)
Hemoglobin: 12 g/dL (ref 12.0–15.0)
Immature Granulocytes: 0 %
Lymphocytes Relative: 24 %
Lymphs Abs: 0.6 10*3/uL — ABNORMAL LOW (ref 0.7–4.0)
MCH: 30.4 pg (ref 26.0–34.0)
MCHC: 33.5 g/dL (ref 30.0–36.0)
MCV: 90.6 fL (ref 80.0–100.0)
Monocytes Absolute: 0.7 10*3/uL (ref 0.1–1.0)
Monocytes Relative: 25 %
Neutro Abs: 0.9 10*3/uL — ABNORMAL LOW (ref 1.7–7.7)
Neutrophils Relative %: 35 %
Platelet Count: 170 10*3/uL (ref 150–400)
RBC: 3.95 MIL/uL (ref 3.87–5.11)
RDW: 16 % — ABNORMAL HIGH (ref 11.5–15.5)
WBC Count: 2.6 10*3/uL — ABNORMAL LOW (ref 4.0–10.5)
nRBC: 0 % (ref 0.0–0.2)

## 2023-05-22 LAB — CMP (CANCER CENTER ONLY)
ALT: 22 U/L (ref 0–44)
AST: 19 U/L (ref 15–41)
Albumin: 3.8 g/dL (ref 3.5–5.0)
Alkaline Phosphatase: 52 U/L (ref 38–126)
Anion gap: 6 (ref 5–15)
BUN: 17 mg/dL (ref 8–23)
CO2: 26 mmol/L (ref 22–32)
Calcium: 9.8 mg/dL (ref 8.9–10.3)
Chloride: 107 mmol/L (ref 98–111)
Creatinine: 0.8 mg/dL (ref 0.44–1.00)
GFR, Estimated: >60 mL/min (ref >60)
Glucose, Bld: 68 mg/dL — ABNORMAL LOW (ref 70–99)
Potassium: 3.6 mmol/L (ref 3.5–5.1)
Sodium: 139 mmol/L (ref 135–145)
Total Bilirubin: 0.6 mg/dL (ref 0.3–1.2)
Total Protein: 6.1 g/dL — ABNORMAL LOW (ref 6.5–8.1)

## 2023-05-22 LAB — LACTATE DEHYDROGENASE: LDH: 130 U/L (ref 98–192)

## 2023-05-22 MED ORDER — DEXAMETHASONE 4 MG PO TABS
20.0000 mg | ORAL_TABLET | Freq: Once | ORAL | Status: AC
  Administered 2023-05-22: 20 mg via ORAL
  Filled 2023-05-22: qty 5

## 2023-05-22 MED ORDER — DARATUMUMAB-HYALURONIDASE-FIHJ 1800-30000 MG-UT/15ML ~~LOC~~ SOLN
1800.0000 mg | Freq: Once | SUBCUTANEOUS | Status: AC
  Administered 2023-05-22: 1800 mg via SUBCUTANEOUS
  Filled 2023-05-22: qty 15

## 2023-05-22 MED ORDER — ACETAMINOPHEN 325 MG PO TABS
650.0000 mg | ORAL_TABLET | Freq: Once | ORAL | Status: AC
  Administered 2023-05-22: 650 mg via ORAL
  Filled 2023-05-22: qty 2

## 2023-05-22 MED ORDER — CETIRIZINE HCL 10 MG PO TABS
10.0000 mg | ORAL_TABLET | Freq: Once | ORAL | Status: AC
  Administered 2023-05-22: 10 mg via ORAL
  Filled 2023-05-22: qty 1

## 2023-05-22 MED ORDER — DIPHENHYDRAMINE HCL 25 MG PO CAPS
50.0000 mg | ORAL_CAPSULE | Freq: Once | ORAL | Status: AC
  Administered 2023-05-22: 50 mg via ORAL
  Filled 2023-05-22: qty 2

## 2023-05-22 NOTE — Progress Notes (Signed)
Per Dr Leonides Schanz, ok to tx with ANC 0.9.

## 2023-05-22 NOTE — Patient Instructions (Signed)
[place] [facility]  Discharge Instructions: Thank you for choosing [facility] to provide your oncology and hematology care.   If you have a lab appointment with the Cancer Center, please go directly to the Cancer Center and check in at the registration area.   Wear comfortable clothing and clothing appropriate for easy access to any Portacath or PICC line.   We strive to give you quality time with your provider. You may need to reschedule your appointment if you arrive late (15 or more minutes).  Arriving late affects you and other patients whose appointments are after yours.  Also, if you miss three or more appointments without notifying the office, you may be dismissed from the clinic at the provider's discretion.      For prescription refill requests, have your pharmacy contact our office and allow 72 hours for refills to be completed.    Today you received the following chemotherapy and/or immunotherapy agents Darzalex Faspro      To help prevent nausea and vomiting after your treatment, we encourage you to take your nausea medication as directed.  BELOW ARE SYMPTOMS THAT SHOULD BE REPORTED IMMEDIATELY: *FEVER GREATER THAN 100.4 F (38 C) OR HIGHER *CHILLS OR SWEATING *NAUSEA AND VOMITING THAT IS NOT CONTROLLED WITH YOUR NAUSEA MEDICATION *UNUSUAL SHORTNESS OF BREATH *UNUSUAL BRUISING OR BLEEDING *URINARY PROBLEMS (pain or burning when urinating, or frequent urination) *BOWEL PROBLEMS (unusual diarrhea, constipation, pain near the anus) TENDERNESS IN MOUTH AND THROAT WITH OR WITHOUT PRESENCE OF ULCERS (sore throat, sores in mouth, or a toothache) UNUSUAL RASH, SWELLING OR PAIN  UNUSUAL VAGINAL DISCHARGE OR ITCHING   Items with * indicate a potential emergency and should be followed up as soon as possible or go to the Emergency Department if any problems should occur.  Please show the CHEMOTHERAPY ALERT CARD or IMMUNOTHERAPY ALERT CARD at  check-in to the Emergency Department and triage nurse.  Should you have questions after your visit or need to cancel or reschedule your appointment, please contact [facility]  Dept: [phone]  and follow the prompts.  Office hours are 8:00 a.m. to 4:30 p.m. Monday - Friday. Please note that voicemails left after 4:00 p.m. may not be returned until the following business day.  We are closed weekends and major holidays. You have access to a nurse at all times for urgent questions. Please call the main number to the clinic Dept: [phone] and follow the prompts.   For any non-urgent questions, you may also contact your provider using MyChart. We now offer e-Visits for anyone 18 and older to request care online for non-urgent symptoms. For details visit [email or web address].   Also download the MyChart app! Go to the app store, search "MyChart", open the app, select [facility], and log in with your MyChart username and password.  

## 2023-05-22 NOTE — Telephone Encounter (Signed)
Patient is aware of upcoming appointment times/dates.  

## 2023-05-25 LAB — KAPPA/LAMBDA LIGHT CHAINS
Kappa free light chain: 12 mg/L (ref 3.3–19.4)
Kappa, lambda light chain ratio: 0.14 — ABNORMAL LOW (ref 0.26–1.65)
Lambda free light chains: 86.5 mg/L — ABNORMAL HIGH (ref 5.7–26.3)

## 2023-05-27 LAB — MULTIPLE MYELOMA PANEL, SERUM
Albumin SerPl Elph-Mcnc: 3.7 g/dL (ref 2.9–4.4)
Albumin/Glob SerPl: 1.9 — ABNORMAL HIGH (ref 0.7–1.7)
Alpha 1: 0.2 g/dL (ref 0.0–0.4)
Alpha2 Glob SerPl Elph-Mcnc: 0.6 g/dL (ref 0.4–1.0)
B-Globulin SerPl Elph-Mcnc: 0.7 g/dL (ref 0.7–1.3)
Gamma Glob SerPl Elph-Mcnc: 0.5 g/dL (ref 0.4–1.8)
Globulin, Total: 2 g/dL — ABNORMAL LOW (ref 2.2–3.9)
IgA: 40 mg/dL — ABNORMAL LOW (ref 64–422)
IgG (Immunoglobin G), Serum: 525 mg/dL — ABNORMAL LOW (ref 586–1602)
IgM (Immunoglobulin M), Srm: 10 mg/dL — ABNORMAL LOW (ref 26–217)
M Protein SerPl Elph-Mcnc: 0.2 g/dL — ABNORMAL HIGH
Total Protein ELP: 5.7 g/dL — ABNORMAL LOW (ref 6.0–8.5)

## 2023-05-29 ENCOUNTER — Other Ambulatory Visit: Payer: Self-pay

## 2023-05-29 ENCOUNTER — Inpatient Hospital Stay: Payer: Medicare Other | Attending: Physician Assistant

## 2023-05-29 ENCOUNTER — Encounter: Payer: Self-pay | Admitting: Hematology and Oncology

## 2023-05-29 DIAGNOSIS — Z79624 Long term (current) use of inhibitors of nucleotide synthesis: Secondary | ICD-10-CM | POA: Insufficient documentation

## 2023-05-29 DIAGNOSIS — Z7961 Long term (current) use of immunomodulator: Secondary | ICD-10-CM | POA: Insufficient documentation

## 2023-05-29 DIAGNOSIS — C9001 Multiple myeloma in remission: Secondary | ICD-10-CM

## 2023-05-29 DIAGNOSIS — Z7982 Long term (current) use of aspirin: Secondary | ICD-10-CM | POA: Insufficient documentation

## 2023-05-29 DIAGNOSIS — C9000 Multiple myeloma not having achieved remission: Secondary | ICD-10-CM | POA: Insufficient documentation

## 2023-05-29 DIAGNOSIS — Z79890 Hormone replacement therapy (postmenopausal): Secondary | ICD-10-CM | POA: Diagnosis not present

## 2023-05-29 DIAGNOSIS — Z5112 Encounter for antineoplastic immunotherapy: Secondary | ICD-10-CM | POA: Insufficient documentation

## 2023-05-29 DIAGNOSIS — Z79899 Other long term (current) drug therapy: Secondary | ICD-10-CM | POA: Insufficient documentation

## 2023-05-29 LAB — CBC WITH DIFFERENTIAL (CANCER CENTER ONLY)
Abs Immature Granulocytes: 0 10*3/uL (ref 0.00–0.07)
Basophils Absolute: 0.1 10*3/uL (ref 0.0–0.1)
Basophils Relative: 2 %
Eosinophils Absolute: 0.2 10*3/uL (ref 0.0–0.5)
Eosinophils Relative: 5 %
HCT: 36.5 % (ref 36.0–46.0)
Hemoglobin: 12.2 g/dL (ref 12.0–15.0)
Immature Granulocytes: 0 %
Lymphocytes Relative: 32 %
Lymphs Abs: 1.3 10*3/uL (ref 0.7–4.0)
MCH: 30.6 pg (ref 26.0–34.0)
MCHC: 33.4 g/dL (ref 30.0–36.0)
MCV: 91.5 fL (ref 80.0–100.0)
Monocytes Absolute: 0.8 10*3/uL (ref 0.1–1.0)
Monocytes Relative: 20 %
Neutro Abs: 1.7 10*3/uL (ref 1.7–7.7)
Neutrophils Relative %: 41 %
Platelet Count: 280 10*3/uL (ref 150–400)
RBC: 3.99 MIL/uL (ref 3.87–5.11)
RDW: 16.7 % — ABNORMAL HIGH (ref 11.5–15.5)
WBC Count: 4 10*3/uL (ref 4.0–10.5)
nRBC: 0 % (ref 0.0–0.2)

## 2023-05-29 LAB — CMP (CANCER CENTER ONLY)
ALT: 22 U/L (ref 0–44)
AST: 20 U/L (ref 15–41)
Albumin: 4.1 g/dL (ref 3.5–5.0)
Alkaline Phosphatase: 49 U/L (ref 38–126)
Anion gap: 6 (ref 5–15)
BUN: 24 mg/dL — ABNORMAL HIGH (ref 8–23)
CO2: 26 mmol/L (ref 22–32)
Calcium: 9.5 mg/dL (ref 8.9–10.3)
Chloride: 106 mmol/L (ref 98–111)
Creatinine: 0.94 mg/dL (ref 0.44–1.00)
GFR, Estimated: >60 mL/min (ref >60)
Glucose, Bld: 74 mg/dL (ref 70–99)
Potassium: 4.1 mmol/L (ref 3.5–5.1)
Sodium: 138 mmol/L (ref 135–145)
Total Bilirubin: 0.8 mg/dL (ref 0.3–1.2)
Total Protein: 5.9 g/dL — ABNORMAL LOW (ref 6.5–8.1)

## 2023-05-30 LAB — BETA 2 MICROGLOBULIN, SERUM: Beta-2 Microglobulin: 1.4 mg/L (ref 0.6–2.4)

## 2023-06-05 ENCOUNTER — Encounter: Payer: Self-pay | Admitting: Hematology and Oncology

## 2023-06-05 ENCOUNTER — Inpatient Hospital Stay (HOSPITAL_BASED_OUTPATIENT_CLINIC_OR_DEPARTMENT_OTHER): Payer: Medicare Other | Admitting: Hematology and Oncology

## 2023-06-05 ENCOUNTER — Other Ambulatory Visit: Payer: Self-pay

## 2023-06-05 ENCOUNTER — Inpatient Hospital Stay: Payer: Medicare Other

## 2023-06-05 VITALS — BP 120/69 | HR 55 | Temp 97.6°F | Resp 17 | Ht 66.0 in | Wt 137.7 lb

## 2023-06-05 DIAGNOSIS — Z7982 Long term (current) use of aspirin: Secondary | ICD-10-CM | POA: Diagnosis not present

## 2023-06-05 DIAGNOSIS — D6481 Anemia due to antineoplastic chemotherapy: Secondary | ICD-10-CM | POA: Diagnosis not present

## 2023-06-05 DIAGNOSIS — Z79890 Hormone replacement therapy (postmenopausal): Secondary | ICD-10-CM | POA: Diagnosis not present

## 2023-06-05 DIAGNOSIS — T451X5A Adverse effect of antineoplastic and immunosuppressive drugs, initial encounter: Secondary | ICD-10-CM | POA: Diagnosis not present

## 2023-06-05 DIAGNOSIS — C9000 Multiple myeloma not having achieved remission: Secondary | ICD-10-CM | POA: Diagnosis not present

## 2023-06-05 DIAGNOSIS — M899 Disorder of bone, unspecified: Secondary | ICD-10-CM

## 2023-06-05 DIAGNOSIS — C9001 Multiple myeloma in remission: Secondary | ICD-10-CM

## 2023-06-05 DIAGNOSIS — Z5112 Encounter for antineoplastic immunotherapy: Secondary | ICD-10-CM | POA: Diagnosis not present

## 2023-06-05 DIAGNOSIS — Z79624 Long term (current) use of inhibitors of nucleotide synthesis: Secondary | ICD-10-CM | POA: Diagnosis not present

## 2023-06-05 DIAGNOSIS — Z7961 Long term (current) use of immunomodulator: Secondary | ICD-10-CM | POA: Diagnosis not present

## 2023-06-05 LAB — CBC WITH DIFFERENTIAL (CANCER CENTER ONLY)
Abs Immature Granulocytes: 0.01 10*3/uL (ref 0.00–0.07)
Basophils Absolute: 0.1 10*3/uL (ref 0.0–0.1)
Basophils Relative: 3 %
Eosinophils Absolute: 0.3 10*3/uL (ref 0.0–0.5)
Eosinophils Relative: 6 %
HCT: 36 % (ref 36.0–46.0)
Hemoglobin: 11.8 g/dL — ABNORMAL LOW (ref 12.0–15.0)
Immature Granulocytes: 0 %
Lymphocytes Relative: 23 %
Lymphs Abs: 1 10*3/uL (ref 0.7–4.0)
MCH: 30.1 pg (ref 26.0–34.0)
MCHC: 32.8 g/dL (ref 30.0–36.0)
MCV: 91.8 fL (ref 80.0–100.0)
Monocytes Absolute: 0.4 10*3/uL (ref 0.1–1.0)
Monocytes Relative: 9 %
Neutro Abs: 2.7 10*3/uL (ref 1.7–7.7)
Neutrophils Relative %: 59 %
Platelet Count: 273 10*3/uL (ref 150–400)
RBC: 3.92 MIL/uL (ref 3.87–5.11)
RDW: 17.2 % — ABNORMAL HIGH (ref 11.5–15.5)
WBC Count: 4.5 10*3/uL (ref 4.0–10.5)
nRBC: 0 % (ref 0.0–0.2)

## 2023-06-05 LAB — CMP (CANCER CENTER ONLY)
ALT: 22 U/L (ref 0–44)
AST: 20 U/L (ref 15–41)
Albumin: 4.2 g/dL (ref 3.5–5.0)
Alkaline Phosphatase: 46 U/L (ref 38–126)
Anion gap: 4 — ABNORMAL LOW (ref 5–15)
BUN: 18 mg/dL (ref 8–23)
CO2: 29 mmol/L (ref 22–32)
Calcium: 10.1 mg/dL (ref 8.9–10.3)
Chloride: 106 mmol/L (ref 98–111)
Creatinine: 0.86 mg/dL (ref 0.44–1.00)
GFR, Estimated: >60 mL/min (ref >60)
Glucose, Bld: 80 mg/dL (ref 70–99)
Potassium: 4.1 mmol/L (ref 3.5–5.1)
Sodium: 139 mmol/L (ref 135–145)
Total Bilirubin: 0.6 mg/dL (ref 0.3–1.2)
Total Protein: 6.3 g/dL — ABNORMAL LOW (ref 6.5–8.1)

## 2023-06-05 LAB — LACTATE DEHYDROGENASE: LDH: 146 U/L (ref 98–192)

## 2023-06-05 MED ORDER — DARATUMUMAB-HYALURONIDASE-FIHJ 1800-30000 MG-UT/15ML ~~LOC~~ SOLN
1800.0000 mg | Freq: Once | SUBCUTANEOUS | Status: AC
  Administered 2023-06-05: 1800 mg via SUBCUTANEOUS
  Filled 2023-06-05: qty 15

## 2023-06-05 MED ORDER — ZOLEDRONIC ACID 4 MG/100ML IV SOLN
4.0000 mg | Freq: Once | INTRAVENOUS | Status: AC
  Administered 2023-06-05: 4 mg via INTRAVENOUS
  Filled 2023-06-05: qty 100

## 2023-06-05 MED ORDER — ACETAMINOPHEN 325 MG PO TABS
650.0000 mg | ORAL_TABLET | Freq: Once | ORAL | Status: AC
  Administered 2023-06-05: 650 mg via ORAL
  Filled 2023-06-05: qty 2

## 2023-06-05 MED ORDER — SODIUM CHLORIDE 0.9 % IV SOLN: INTRAVENOUS | Status: DC

## 2023-06-05 MED ORDER — DEXAMETHASONE 4 MG PO TABS
20.0000 mg | ORAL_TABLET | Freq: Once | ORAL | Status: AC
  Administered 2023-06-05: 20 mg via ORAL
  Filled 2023-06-05: qty 5

## 2023-06-05 MED ORDER — CETIRIZINE HCL 10 MG PO TABS
10.0000 mg | ORAL_TABLET | Freq: Once | ORAL | Status: AC
  Administered 2023-06-05: 10 mg via ORAL
  Filled 2023-06-05: qty 1

## 2023-06-05 NOTE — Progress Notes (Unsigned)
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 Concerning for multiple myeloma vs metastatic disease 12/31/2020: establish care with Dr. Leonides Schanz. UPEP showed marked Bence Jones protein, findings concerning for multiple myleoma 01/21/2021: bone marrow biopsy confirms multiple myeloma with atypical plasma cells representing 28% of all cells in the aspirate  02/01/2021: Cycle 1 Day 1 of VRd chemotherapy 02/22/2021: Cycle 2 Day 1 of VRd chemotherapy 03/15/2021: Cycle 3 Day 1 of VRd chemotherapy 04/05/2021: Cycle 4 Day 1 of VRd chemotherapy 04/26/2021: Cycle 5 Day 1 of VRd chemotherapy 05/17/2021: Cycle 6 Day 1 of VRd chemotherapy 06/07/2021:  Cycle 7 Day 1 of VRd chemotherapy 06/28/2021: Cycle 8 Day 1 of VRd chemotherapy 07/19/2021: start maintenance revlimid 10mg  PO daily.  03/26/2022: Cycle 1 Day 1 of Dara/Pom/Dex 04/24/2023: Cycle 2 Day 1 of Dara/Pom/Dex  Interval History:  Doris Wilkerson 77 y.o. female with medical history significant for lambda light chain multiple myeloma who presents for a follow up visit. She was last seen on 05/01/2023. In the interim, she started Dara/Pom/Dex. She is due for Cycle 2, Day 22 today.   On exam today [name] reports she is "over concerns about toxicity".  She notes that she is delighted to hear that her ANC is holding up steadily despite continuous Pomalyst 2 mg dose.  She reports that she has been walking and feeling well overall.  She notes that taking the Benadryl out of her regimen and replacing with sertraline has been excellent as she takes a brief nap on Friday  afternoon that is not "wiped out for the whole day".  She notes that she is not having any nausea, vomiting, or diarrhea but the neuropathy persists as a "nuisance".  She does that she is not having any of the bone pain that she was previously but she says for a long period of time to become sore.  She is doing her best to try to keep up with hydration.  She denies fevers, chills, night sweats, shortness of breath, chest pain or cough. She has no other complaints. A full 10 point ROS is listed below.   Overall she is willing and able to proceed with Darzalex and Pomalyst therapy at this time.  MEDICAL HISTORY:  Past Medical History:  Diagnosis Date   Asthma    Cataract    GERD (gastroesophageal reflux disease)    Hyperlipidemia    Hypertension    Thyroid disease     SURGICAL HISTORY: Past Surgical History:  Procedure Laterality Date   Cataract Surgery     CORONARY ANGIOPLASTY WITH STENT PLACEMENT     EYE SURGERY     ORTHOPEDIC SURGERY  2012   TONSILLECTOMY  1951    SOCIAL HISTORY: Social History   Socioeconomic History   Marital status: Married    Spouse name: Not on file   Number of children: 0   Years of education: Not on file   Highest education level: Master's degree (e.g., MA, MS, MEng, MEd, MSW, MBA)  Occupational History   Occupation: unemployed  Tobacco Use   Smoking status: Never   Smokeless tobacco: Never  Vaping Use   Vaping status:  Never Used  Substance and Sexual Activity   Alcohol use: Yes    Alcohol/week: 1.0 standard drink of alcohol    Types: 1 Glasses of wine per week    Comment: occassionally   Drug use: No   Sexual activity: Not Currently  Other Topics Concern   Not on file  Social History Narrative   Married. Education: college. Pt does exercise-   Social Determinants of Health   Financial Resource Strain: Low Risk  (04/24/2023)   Overall Financial Resource Strain (CARDIA)    Difficulty of Paying Living Expenses: Not hard at all  Food  Insecurity: No Food Insecurity (04/24/2023)   Hunger Vital Sign    Worried About Running Out of Food in the Last Year: Never true    Ran Out of Food in the Last Year: Never true  Transportation Needs: No Transportation Needs (04/24/2023)   PRAPARE - Administrator, Civil Service (Medical): No    Lack of Transportation (Non-Medical): No  Physical Activity: Insufficiently Active (04/24/2023)   Exercise Vital Sign    Days of Exercise per Week: 7 days    Minutes of Exercise per Session: 20 min  Stress: No Stress Concern Present (04/24/2023)   [employer] of Occupational Health - Occupational Stress Questionnaire    Feeling of Stress : Only a little  Social Connections: Moderately Integrated (04/24/2023)   Social Connection and Isolation Panel [NHANES]    Frequency of Communication with Friends and Family: Twice a week    Frequency of Social Gatherings with Friends and Family: Once a week    Attends Religious Services: Never    Database administrator or Organizations: Yes    Attends Engineer, structural: More than 4 times per year    Marital Status: Married  Catering manager Violence: Not At Risk (04/08/2023)   Humiliation, Afraid, Rape, and Kick questionnaire    Fear of Current or Ex-Partner: No    Emotionally Abused: No    Physically Abused: No    Sexually Abused: No    FAMILY HISTORY: Family History  Problem Relation Age of Onset   Cancer Father    Heart disease Brother    Dementia Brother    Leukemia Brother    Breast cancer Neg Hx    Colon cancer Neg Hx    Esophageal cancer Neg Hx    Pancreatic cancer Neg Hx    Rectal cancer Neg Hx    Stomach cancer Neg Hx     ALLERGIES:  is allergic to poison ivy extract, plavix [clopidogrel bisulfate], and other.  MEDICATIONS:  Current Outpatient Medications  Medication Sig Dispense Refill   pomalidomide (POMALYST) 1 MG capsule Take 1 capsule (1 mg total) by mouth daily. [employer] Auth # [id number]     Date  Obtained 05/20/23 Take 1 capsule for 21 days then none for 7 days 21 capsule 0   acyclovir (ZOVIRAX) 400 MG tablet Take 1 tablet (400 mg total) by mouth 2 (two) times daily. 60 tablet 3   aspirin 81 MG tablet Take 81 mg by mouth daily.      atorvastatin (LIPITOR) 80 MG tablet TAKE 1 TABLET(80 MG) BY MOUTH DAILY AT 6 PM 90 tablet 1   benazepril (LOTENSIN) 10 MG tablet TAKE 1 TABLET(10 MG) BY MOUTH DAILY 90 tablet 1   Blood Glucose Monitoring Suppl DEVI 1 each by Does not apply route as needed (symptoms of low blood sugar.). May substitute to any manufacturer covered by patient's insurance.  1 each 0   calcium carbonate (TUMS - DOSED IN MG ELEMENTAL CALCIUM) 500 MG chewable tablet Chew 1 tablet by mouth daily. Daily PRN     cholecalciferol (VITAMIN D3) 25 MCG (1000 UNIT) tablet Take 2,000 Units by mouth daily.     gabapentin (NEURONTIN) 100 MG capsule TAKE 1 CAPSULE(100 MG) BY MOUTH TWICE DAILY AS NEEDED 180 capsule 1   Glucose Blood (BLOOD GLUCOSE TEST STRIPS) STRP 1 each by In Vitro route as needed. May substitute to any manufacturer covered by patient's insurance. 10 strip 2   Lancet Device MISC 1 each by Does not apply route as needed. May substitute to any manufacturer covered by patient's insurance. 1 each 0   Lancets Misc. MISC 1 each by Does not apply route as needed. May substitute to any manufacturer covered by patient's insurance. 10 each 0   levothyroxine (SYNTHROID) 25 MCG tablet TAKE 1 TABLET(25 MCG) BY MOUTH DAILY BEFORE BREAKFAST 90 tablet 3   Magnesium 100 MG TABS Take 100 mg by mouth daily.     polyethylene glycol (MIRALAX / GLYCOLAX) 17 g packet Take 17 g by mouth daily as needed.     No current facility-administered medications for this visit.    REVIEW OF SYSTEMS:   Constitutional: ( - ) fevers, ( - )  chills , ( - ) night sweats Eyes: ( - ) blurriness of vision, ( - ) double vision, ( - ) watery eyes Ears, nose, mouth, throat, and face: ( - ) mucositis, ( - ) sore  throat Respiratory: ( - ) cough, ( - ) dyspnea, ( - ) wheezes Cardiovascular: ( - ) palpitation, ( - ) chest discomfort, ( - ) lower extremity swelling Gastrointestinal:  ( - ) nausea, ( - ) heartburn, ( - ) change in bowel habits Skin: ( - ) abnormal skin rashes Lymphatics: ( - ) new lymphadenopathy, ( - ) easy bruising Neurological: ( +) numbness, ( - ) tingling, ( - ) new weaknesses Behavioral/Psych: ( - ) mood change, ( - ) new changes  All other systems were reviewed with the patient and are negative.  PHYSICAL EXAMINATION: ECOG PERFORMANCE STATUS: 1 - Symptomatic but completely ambulatory  There were no vitals filed for this visit.    There were no vitals filed for this visit.    GENERAL: well appearing elderly Caucasian female in NAD  SKIN: skin color, texture, turgor are normal, no rashes or significant lesions EYES: conjunctiva are pink and non-injected, sclera clear LUNGS: clear to auscultation and percussion with normal breathing effort HEART: regular rate & rhythm and no murmurs and trace lower extremity edema Musculoskeletal: no cyanosis of digits and no clubbing  PSYCH: alert & oriented x 3, fluent speech NEURO: no focal motor/sensory deficits  LABORATORY DATA:  I have reviewed the data as listed    Latest Ref Rng & Units 05/29/2023    9:20 AM 05/22/2023    7:35 AM 05/14/2023    9:09 AM  CBC  WBC 4.0 - 10.5 K/uL 4.0  2.6  5.2   Hemoglobin 12.0 - 15.0 g/dL 11.9  14.7  82.9   Hematocrit 36.0 - 46.0 % 36.5  35.8  37.7   Platelets 150 - 400 K/uL 280  170  241        Latest Ref Rng & Units 05/29/2023    9:20 AM 05/22/2023    7:35 AM 05/14/2023    9:09 AM  CMP  Glucose 70 - 99 mg/dL 74  68  82   BUN 8 - 23 mg/dL 24  17  18    Creatinine 0.44 - 1.00 mg/dL 1.61  0.96  0.45   Sodium 135 - 145 mmol/L 138  139  137   Potassium 3.5 - 5.1 mmol/L 4.1  3.6  4.0   Chloride 98 - 111 mmol/L 106  107  106   CO2 22 - 32 mmol/L 26  26  26    Calcium 8.9 - 10.3 mg/dL 9.5  9.8   9.5   Total Protein 6.5 - 8.1 g/dL 5.9  6.1  6.0   Total Bilirubin 0.3 - 1.2 mg/dL 0.8  0.6  0.7   Alkaline Phos 38 - 126 U/L 49  52  51   AST 15 - 41 U/L 20  19  21    ALT 0 - 44 U/L 22  22  23      Lab Results  Component Value Date   MPROTEIN 0.2 (H) 05/22/2023   MPROTEIN 0.2 (H) 04/24/2023   MPROTEIN Not Observed 03/27/2023   Lab Results  Component Value Date   KPAFRELGTCHN 12.0 05/22/2023   KPAFRELGTCHN 7.8 04/24/2023   KPAFRELGTCHN 14.7 03/27/2023   LAMBDASER 86.5 (H) 05/22/2023   LAMBDASER 71.5 (H) 04/24/2023   LAMBDASER 150.2 (H) 03/27/2023   KAPLAMBRATIO 0.14 (L) 05/22/2023   KAPLAMBRATIO 0.11 (L) 04/24/2023   KAPLAMBRATIO 0.10 (L) 03/27/2023    RADIOGRAPHIC STUDIES: No results found.  ASSESSMENT & PLAN Breezy Daignault 77 y.o. female with medical history significant for lambda light chain multiple myeloma who presents for a follow up visit. Ms. Gollihar is not a candidate for bone marrow transplant based on her advanced age.     # Lambda Light Chain Multiple Myeloma--VGPR on maintenance --diagnosis confirmed with bone marrow biopsy and urine protein analysis.   --patient has good functional status and would be an excellent candidate for VRd chemotherapy. I do not believe she would be a bone marrow transplant candidate based on her age.  --Received VRD chemotherapy from 02/01/2021-07/12/2021 then started maintenance Revlimid on 07/19/2021.  --Due to rising lambda light chain, recommended to switch to Daratumumab/Pomalyst/Dexamethasone starting today, 03/27/2023. --Started Cycle 1, Day 1 of Dara/Pom/Dex on 03/27/2023 Plan: --Due to start Cycle 3, Day 1 of Dara/Pom/Dex today --Labs today show white blood cell count ***.  Creatinine and LFTs are within normal limits. -- Patient is currently on Pomalyst 2 mg p.o.  She did become neutropenic on this dose.  Patient is interested in trying different schedules for this medication including 2 weeks on and 2 weeks off.  We could also  consider decreasing dose to 1 mg p.o if 2 mg causes persistent cytopenias.  --Return to clinic weekly for labs/treatment and follow up visit in 2 weeks  #Hypoglycemia: --Glucose was ***, non-fasting --Patient is asymptomatic today. Encouraged to eat right before treatment and snacks every 2-3 hours.  --Patient will check her glucose levels on non-treatment days and follow up with PCP if non-fasting glucose is consistently 70 or below.  #Back Pain/Left Hip Pain  -- Currently well controlled on gabapentin and Tylenol --CT thoracic and lumbar spine showed no lytic lesions or sequelae of multiple myeloma --MRI thoracic and lumbar spine from 07/07/2022 for baseline imaging. Findings showed multiple bone lesions involving posterior T9 and T8, lumbar spine and upper sacrum consistent with multiple myeloma. No extension into the canal. No pathologic fracture.  --continue follow up with Dr .Donalee Citrin, neurosugery --Continue to monitor  #Supportive Care --chemotherapy education complete  --  zofran 8mg  q8H PRN and compazine 10mg  PO q6H for nausea -- acyclovir 400mg  PO BID for VCZ prophylaxis --zometa therapy last received on 02/24/2023. Next due in July 2024. -- no prescription pain medication required at this time.   No orders of the defined types were placed in this encounter.  All questions were answered. The patient knows to call the clinic with any problems, questions or concerns.  I have spent a total of 30 minutes minutes of face-to-face and non-face-to-face time, preparing to see the patient, performing a medically appropriate examination, counseling and educating the patient, documenting clinical information in the electronic health record.   Ulysees Barns, MD Department of Hematology/Oncology [facility] Phone: [phone] Pager: [phone] Email: [email or web address]  06/05/2023 7:11 AM

## 2023-06-05 NOTE — Patient Instructions (Signed)
[facility]  Discharge Instructions: Thank you for choosing [facility] to provide your oncology and hematology care.   If you have a lab appointment with the Cancer Center, please go directly to the Cancer Center and check in at the registration area.   Wear comfortable clothing and clothing appropriate for easy access to any Portacath or PICC line.   We strive to give you quality time with your provider. You may need to reschedule your appointment if you arrive late (15 or more minutes).  Arriving late affects you and other patients whose appointments are after yours.  Also, if you miss three or more appointments without notifying the office, you may be dismissed from the clinic at the provider's discretion.      For prescription refill requests, have your pharmacy contact our office and allow 72 hours for refills to be completed.    Today you received the following chemotherapy and/or immunotherapy agents Darzalex Faspro      To help prevent nausea and vomiting after your treatment, we encourage you to take your nausea medication as directed.  BELOW ARE SYMPTOMS THAT SHOULD BE REPORTED IMMEDIATELY: *FEVER GREATER THAN 100.4 F (38 C) OR HIGHER *CHILLS OR SWEATING *NAUSEA AND VOMITING THAT IS NOT CONTROLLED WITH YOUR NAUSEA MEDICATION *UNUSUAL SHORTNESS OF BREATH *UNUSUAL BRUISING OR BLEEDING *URINARY PROBLEMS (pain or burning when urinating, or frequent urination) *BOWEL PROBLEMS (unusual diarrhea, constipation, pain near the anus) TENDERNESS IN MOUTH AND THROAT WITH OR WITHOUT PRESENCE OF ULCERS (sore throat, sores in mouth, or a toothache) UNUSUAL RASH, SWELLING OR PAIN  UNUSUAL VAGINAL DISCHARGE OR ITCHING   Items with * indicate a potential emergency and should be followed up as soon as possible or go to the Emergency Department if any problems should occur.  Please show the CHEMOTHERAPY ALERT CARD or IMMUNOTHERAPY ALERT CARD at  check-in to the Emergency Department and triage nurse.  Should you have questions after your visit or need to cancel or reschedule your appointment, please contact [facility]  Dept: [phone]  and follow the prompts.  Office hours are 8:00 a.m. to 4:30 p.m. Monday - Friday. Please note that voicemails left after 4:00 p.m. may not be returned until the following business day.  We are closed weekends and major holidays. You have access to a nurse at all times for urgent questions. Please call the main number to the clinic Dept: [phone] and follow the prompts.   For any non-urgent questions, you may also contact your provider using MyChart. We now offer e-Visits for anyone 18 and older to request care online for non-urgent symptoms. For details visit [email or web address].   Also download the MyChart app! Go to the app store, search "MyChart", open the app, select [facility], and log in with your MyChart username and password.  

## 2023-06-07 ENCOUNTER — Encounter: Payer: Self-pay | Admitting: Hematology and Oncology

## 2023-06-08 LAB — KAPPA/LAMBDA LIGHT CHAINS
Kappa free light chain: 9.7 mg/L (ref 3.3–19.4)
Kappa, lambda light chain ratio: 0.11 — ABNORMAL LOW (ref 0.26–1.65)
Lambda free light chains: 87 mg/L — ABNORMAL HIGH (ref 5.7–26.3)

## 2023-06-10 LAB — MULTIPLE MYELOMA PANEL, SERUM
Albumin SerPl Elph-Mcnc: 3.9 g/dL (ref 2.9–4.4)
Albumin/Glob SerPl: 1.9 — ABNORMAL HIGH (ref 0.7–1.7)
Alpha 1: 0.2 g/dL (ref 0.0–0.4)
Alpha2 Glob SerPl Elph-Mcnc: 0.6 g/dL (ref 0.4–1.0)
B-Globulin SerPl Elph-Mcnc: 0.9 g/dL (ref 0.7–1.3)
Gamma Glob SerPl Elph-Mcnc: 0.4 g/dL (ref 0.4–1.8)
Globulin, Total: 2.1 g/dL — ABNORMAL LOW (ref 2.2–3.9)
IgA: 37 mg/dL — ABNORMAL LOW (ref 64–422)
IgG (Immunoglobin G), Serum: 493 mg/dL — ABNORMAL LOW (ref 586–1602)
IgM (Immunoglobulin M), Srm: 10 mg/dL — ABNORMAL LOW (ref 26–217)
M Protein SerPl Elph-Mcnc: 0.2 g/dL — ABNORMAL HIGH
Total Protein ELP: 6 g/dL (ref 6.0–8.5)

## 2023-06-11 ENCOUNTER — Other Ambulatory Visit: Payer: Self-pay | Admitting: *Deleted

## 2023-06-11 MED ORDER — POMALIDOMIDE 1 MG PO CAPS: 1.0000 mg | ORAL_CAPSULE | Freq: Every day | ORAL | 0 refills | Status: DC

## 2023-06-15 ENCOUNTER — Other Ambulatory Visit: Payer: Medicare Other

## 2023-06-19 ENCOUNTER — Other Ambulatory Visit: Payer: Self-pay

## 2023-06-19 ENCOUNTER — Inpatient Hospital Stay: Payer: Medicare Other

## 2023-06-19 VITALS — BP 129/67 | HR 72 | Temp 98.2°F | Resp 18 | Wt 138.0 lb

## 2023-06-19 DIAGNOSIS — C9000 Multiple myeloma not having achieved remission: Secondary | ICD-10-CM | POA: Diagnosis not present

## 2023-06-19 DIAGNOSIS — Z5112 Encounter for antineoplastic immunotherapy: Secondary | ICD-10-CM | POA: Diagnosis not present

## 2023-06-19 DIAGNOSIS — Z79624 Long term (current) use of inhibitors of nucleotide synthesis: Secondary | ICD-10-CM | POA: Diagnosis not present

## 2023-06-19 DIAGNOSIS — Z7982 Long term (current) use of aspirin: Secondary | ICD-10-CM | POA: Diagnosis not present

## 2023-06-19 DIAGNOSIS — Z7961 Long term (current) use of immunomodulator: Secondary | ICD-10-CM | POA: Diagnosis not present

## 2023-06-19 DIAGNOSIS — Z79890 Hormone replacement therapy (postmenopausal): Secondary | ICD-10-CM | POA: Diagnosis not present

## 2023-06-19 LAB — CMP (CANCER CENTER ONLY)
ALT: 22 U/L (ref 0–44)
AST: 21 U/L (ref 15–41)
Albumin: 4.4 g/dL (ref 3.5–5.0)
Alkaline Phosphatase: 55 U/L (ref 38–126)
Anion gap: 5 (ref 5–15)
BUN: 15 mg/dL (ref 8–23)
CO2: 27 mmol/L (ref 22–32)
Calcium: 9.9 mg/dL (ref 8.9–10.3)
Chloride: 107 mmol/L (ref 98–111)
Creatinine: 0.8 mg/dL (ref 0.44–1.00)
GFR, Estimated: >60 mL/min (ref >60)
Glucose, Bld: 74 mg/dL (ref 70–99)
Potassium: 3.8 mmol/L (ref 3.5–5.1)
Sodium: 139 mmol/L (ref 135–145)
Total Bilirubin: 0.7 mg/dL (ref 0.3–1.2)
Total Protein: 6.4 g/dL — ABNORMAL LOW (ref 6.5–8.1)

## 2023-06-19 LAB — CBC WITH DIFFERENTIAL (CANCER CENTER ONLY)
Abs Immature Granulocytes: 0 10*3/uL (ref 0.00–0.07)
Basophils Absolute: 0.1 10*3/uL (ref 0.0–0.1)
Basophils Relative: 2 %
Eosinophils Absolute: 0.5 10*3/uL (ref 0.0–0.5)
Eosinophils Relative: 11 %
HCT: 37.5 % (ref 36.0–46.0)
Hemoglobin: 12.6 g/dL (ref 12.0–15.0)
Immature Granulocytes: 0 %
Lymphocytes Relative: 19 %
Lymphs Abs: 0.9 10*3/uL (ref 0.7–4.0)
MCH: 30.8 pg (ref 26.0–34.0)
MCHC: 33.6 g/dL (ref 30.0–36.0)
MCV: 91.7 fL (ref 80.0–100.0)
Monocytes Absolute: 0.8 10*3/uL (ref 0.1–1.0)
Monocytes Relative: 17 %
Neutro Abs: 2.2 10*3/uL (ref 1.7–7.7)
Neutrophils Relative %: 51 %
Platelet Count: 203 10*3/uL (ref 150–400)
RBC: 4.09 MIL/uL (ref 3.87–5.11)
RDW: 16.1 % — ABNORMAL HIGH (ref 11.5–15.5)
WBC Count: 4.5 10*3/uL (ref 4.0–10.5)
nRBC: 0 % (ref 0.0–0.2)

## 2023-06-19 LAB — TYPE AND SCREEN
ABO/RH(D): O POS
Antibody Screen: POSITIVE

## 2023-06-19 LAB — LACTATE DEHYDROGENASE: LDH: 149 U/L (ref 98–192)

## 2023-06-19 MED ORDER — ACETAMINOPHEN 325 MG PO TABS
650.0000 mg | ORAL_TABLET | Freq: Once | ORAL | Status: AC
  Administered 2023-06-19: 650 mg via ORAL
  Filled 2023-06-19: qty 2

## 2023-06-19 MED ORDER — DEXAMETHASONE 4 MG PO TABS
20.0000 mg | ORAL_TABLET | Freq: Once | ORAL | Status: AC
  Administered 2023-06-19: 20 mg via ORAL
  Filled 2023-06-19: qty 5

## 2023-06-19 MED ORDER — DARATUMUMAB-HYALURONIDASE-FIHJ 1800-30000 MG-UT/15ML ~~LOC~~ SOLN
1800.0000 mg | Freq: Once | SUBCUTANEOUS | Status: AC
  Administered 2023-06-19: 1800 mg via SUBCUTANEOUS
  Filled 2023-06-19: qty 15

## 2023-06-19 MED ORDER — DIPHENHYDRAMINE HCL 25 MG PO CAPS
50.0000 mg | ORAL_CAPSULE | Freq: Once | ORAL | Status: AC
  Administered 2023-06-19: 50 mg via ORAL
  Filled 2023-06-19: qty 2

## 2023-06-19 MED ORDER — CETIRIZINE HCL 10 MG PO TABS
10.0000 mg | ORAL_TABLET | Freq: Every day | ORAL | Status: DC
  Administered 2023-06-19: 10 mg via ORAL
  Filled 2023-06-19: qty 1

## 2023-06-20 ENCOUNTER — Encounter: Payer: Self-pay | Admitting: Hematology and Oncology

## 2023-06-22 ENCOUNTER — Other Ambulatory Visit: Payer: Self-pay | Admitting: *Deleted

## 2023-06-22 ENCOUNTER — Other Ambulatory Visit: Payer: Self-pay | Admitting: Family Medicine

## 2023-06-22 DIAGNOSIS — E782 Mixed hyperlipidemia: Secondary | ICD-10-CM

## 2023-06-22 MED ORDER — POMALIDOMIDE 2 MG PO CAPS: 2.0000 mg | ORAL_CAPSULE | Freq: Every day | ORAL | 0 refills | Status: DC

## 2023-06-23 ENCOUNTER — Other Ambulatory Visit: Payer: Self-pay | Admitting: Hematology and Oncology

## 2023-06-24 ENCOUNTER — Encounter: Payer: Self-pay | Admitting: Hematology and Oncology

## 2023-06-24 ENCOUNTER — Encounter (INDEPENDENT_AMBULATORY_CARE_PROVIDER_SITE_OTHER): Payer: Self-pay

## 2023-07-01 DIAGNOSIS — H538 Other visual disturbances: Secondary | ICD-10-CM | POA: Diagnosis not present

## 2023-07-01 DIAGNOSIS — H26492 Other secondary cataract, left eye: Secondary | ICD-10-CM | POA: Diagnosis not present

## 2023-07-03 ENCOUNTER — Other Ambulatory Visit: Payer: Self-pay

## 2023-07-03 ENCOUNTER — Inpatient Hospital Stay: Payer: Medicare Other | Attending: Physician Assistant

## 2023-07-03 ENCOUNTER — Inpatient Hospital Stay: Payer: Medicare Other | Admitting: Hematology and Oncology

## 2023-07-03 ENCOUNTER — Inpatient Hospital Stay: Payer: Medicare Other

## 2023-07-03 VITALS — BP 125/73 | HR 68 | Temp 98.6°F | Resp 13 | Wt 138.4 lb

## 2023-07-03 DIAGNOSIS — Z7961 Long term (current) use of immunomodulator: Secondary | ICD-10-CM | POA: Insufficient documentation

## 2023-07-03 DIAGNOSIS — C9000 Multiple myeloma not having achieved remission: Secondary | ICD-10-CM | POA: Diagnosis present

## 2023-07-03 DIAGNOSIS — Z7982 Long term (current) use of aspirin: Secondary | ICD-10-CM | POA: Insufficient documentation

## 2023-07-03 DIAGNOSIS — Z5112 Encounter for antineoplastic immunotherapy: Secondary | ICD-10-CM | POA: Insufficient documentation

## 2023-07-03 DIAGNOSIS — E162 Hypoglycemia, unspecified: Secondary | ICD-10-CM | POA: Insufficient documentation

## 2023-07-03 DIAGNOSIS — Z79624 Long term (current) use of inhibitors of nucleotide synthesis: Secondary | ICD-10-CM | POA: Diagnosis not present

## 2023-07-03 DIAGNOSIS — Z79899 Other long term (current) drug therapy: Secondary | ICD-10-CM | POA: Diagnosis not present

## 2023-07-03 DIAGNOSIS — M899 Disorder of bone, unspecified: Secondary | ICD-10-CM | POA: Diagnosis not present

## 2023-07-03 LAB — CMP (CANCER CENTER ONLY)
ALT: 21 U/L (ref 0–44)
AST: 20 U/L (ref 15–41)
Albumin: 4.3 g/dL (ref 3.5–5.0)
Alkaline Phosphatase: 50 U/L (ref 38–126)
Anion gap: 5 (ref 5–15)
BUN: 14 mg/dL (ref 8–23)
CO2: 28 mmol/L (ref 22–32)
Calcium: 9.8 mg/dL (ref 8.9–10.3)
Chloride: 105 mmol/L (ref 98–111)
Creatinine: 0.79 mg/dL (ref 0.44–1.00)
GFR, Estimated: >60 mL/min (ref >60)
Glucose, Bld: 83 mg/dL (ref 70–99)
Potassium: 4.2 mmol/L (ref 3.5–5.1)
Sodium: 138 mmol/L (ref 135–145)
Total Bilirubin: 0.6 mg/dL (ref 0.3–1.2)
Total Protein: 6.4 g/dL — ABNORMAL LOW (ref 6.5–8.1)

## 2023-07-03 LAB — LACTATE DEHYDROGENASE: LDH: 139 U/L (ref 98–192)

## 2023-07-03 LAB — CBC WITH DIFFERENTIAL (CANCER CENTER ONLY)
Abs Immature Granulocytes: 0.01 10*3/uL (ref 0.00–0.07)
Basophils Absolute: 0.1 10*3/uL (ref 0.0–0.1)
Basophils Relative: 1 %
Eosinophils Absolute: 0.4 10*3/uL (ref 0.0–0.5)
Eosinophils Relative: 7 %
HCT: 36.6 % (ref 36.0–46.0)
Hemoglobin: 12.4 g/dL (ref 12.0–15.0)
Immature Granulocytes: 0 %
Lymphocytes Relative: 17 %
Lymphs Abs: 1 10*3/uL (ref 0.7–4.0)
MCH: 30.9 pg (ref 26.0–34.0)
MCHC: 33.9 g/dL (ref 30.0–36.0)
MCV: 91.3 fL (ref 80.0–100.0)
Monocytes Absolute: 0.4 10*3/uL (ref 0.1–1.0)
Monocytes Relative: 8 %
Neutro Abs: 3.9 10*3/uL (ref 1.7–7.7)
Neutrophils Relative %: 67 %
Platelet Count: 267 10*3/uL (ref 150–400)
RBC: 4.01 MIL/uL (ref 3.87–5.11)
RDW: 15.7 % — ABNORMAL HIGH (ref 11.5–15.5)
WBC Count: 5.7 10*3/uL (ref 4.0–10.5)
nRBC: 0 % (ref 0.0–0.2)

## 2023-07-03 MED ORDER — DEXAMETHASONE 4 MG PO TABS
20.0000 mg | ORAL_TABLET | Freq: Once | ORAL | Status: AC
  Administered 2023-07-03: 20 mg via ORAL
  Filled 2023-07-03: qty 5

## 2023-07-03 MED ORDER — DARATUMUMAB-HYALURONIDASE-FIHJ 1800-30000 MG-UT/15ML ~~LOC~~ SOLN
1800.0000 mg | Freq: Once | SUBCUTANEOUS | Status: AC
  Administered 2023-07-03: 1800 mg via SUBCUTANEOUS
  Filled 2023-07-03: qty 15

## 2023-07-03 MED ORDER — ACETAMINOPHEN 325 MG PO TABS
650.0000 mg | ORAL_TABLET | Freq: Once | ORAL | Status: AC
  Administered 2023-07-03: 650 mg via ORAL
  Filled 2023-07-03: qty 2

## 2023-07-03 MED ORDER — CETIRIZINE HCL 10 MG PO TABS
10.0000 mg | ORAL_TABLET | Freq: Every day | ORAL | Status: DC
  Administered 2023-07-03: 10 mg via ORAL
  Filled 2023-07-03: qty 1

## 2023-07-03 NOTE — Progress Notes (Signed)
[facility] [phone]   [phone]  PROGRESS NOTE  Patient Care Team: Shade Flood, MD as PCP - General (Family Medicine) Yates Decamp, MD as Consulting Physician (Cardiology) Marzella Schlein., MD (Ophthalmology)  Hematological/Oncological History # Lambda Light Chain Multiple Myeloma 12/26/2020: MRI of pelvis showed lytic lesions on L4, L5, the sacrum, with pathologic fracture of left inferior pubic ramus. Concerning for multiple myeloma vs metastatic disease 12/31/2020: establish care with Dr. Leonides Schanz. UPEP showed marked Bence Jones protein, findings concerning for multiple myleoma 01/21/2021: bone marrow biopsy confirms multiple myeloma with atypical plasma cells representing 28% of all cells in the aspirate  02/01/2021: Cycle 1 Day 1 of VRd chemotherapy 02/22/2021: Cycle 2 Day 1 of VRd chemotherapy 03/15/2021: Cycle 3 Day 1 of VRd chemotherapy 04/05/2021: Cycle 4 Day 1 of VRd chemotherapy 04/26/2021: Cycle 5 Day 1 of VRd chemotherapy 05/17/2021: Cycle 6 Day 1 of VRd chemotherapy 06/07/2021:  Cycle 7 Day 1 of VRd chemotherapy 06/28/2021: Cycle 8 Day 1 of VRd chemotherapy 07/19/2021: start maintenance revlimid 10mg  PO daily.  03/26/2022: Cycle 1 Day 1 of Dara/Pom/Dex 04/24/2023: Cycle 2 Day 1 of Dara/Pom/Dex 05/22/2023: Cycle 3 Day 1 of Dara/Pom/Dex 06/19/2023: Cycle 4 Day 1 of Dara/Pom/Dex  Interval History:  Doris Wilkerson 77 y.o. female with medical history significant for lambda light chain multiple myeloma who presents for a follow up visit. She was last seen on 06/05/2023. In the interim, she started Dara/Pom/Dex. She is due for Cycle 4, Day 1 today.   On exam today Mrs. Hodgens reports she has been tolerating her Darzalex therapy well.  Overall she has been well in the interim since our last visit.  She has had no recent issues with side effects from her Darzalex shot.  She notes that she is gaining some weight and thinks this may be some water retention.  Her  appetite has been good.  She reports that she has been maintaining her high levels of activity and her energy is excellent.  She notes she has had no recent infectious symptom such as runny nose, sore throat, or cough.  Overall she is willing and able to proceed with Darzalex therapy today.  She denies fevers, chills, night sweats, shortness of breath, chest pain or cough. She has no other complaints. A full 10 point ROS is listed below.   MEDICAL HISTORY:  Past Medical History:  Diagnosis Date   Asthma    Cataract    GERD (gastroesophageal reflux disease)    Hyperlipidemia    Hypertension    Thyroid disease     SURGICAL HISTORY: Past Surgical History:  Procedure Laterality Date   Cataract Surgery     CORONARY ANGIOPLASTY WITH STENT PLACEMENT     EYE SURGERY     ORTHOPEDIC SURGERY  2012   TONSILLECTOMY  1951    SOCIAL HISTORY: Social History   Socioeconomic History   Marital status: Married    Spouse name: Not on file   Number of children: 0   Years of education: Not on file   Highest education level: Master's degree (e.g., MA, MS, MEng, MEd, MSW, MBA)  Occupational History   Occupation: unemployed  Tobacco Use   Smoking status: Never   Smokeless tobacco: Never  Vaping Use   Vaping status: Never Used  Substance and Sexual Activity   Alcohol use: Yes    Alcohol/week: 1.0 standard drink of alcohol    Types: 1 Glasses of wine per week  Comment: occassionally   Drug use: No   Sexual activity: Not Currently  Other Topics Concern   Not on file  Social History Narrative   Married. Education: college. Pt does exercise-   Social Determinants of Health   Financial Resource Strain: Low Risk  (04/24/2023)   Overall Financial Resource Strain (CARDIA)    Difficulty of Paying Living Expenses: Not hard at all  Food Insecurity: No Food Insecurity (04/24/2023)   Hunger Vital Sign    Worried About Running Out of Food in the Last Year: Never true    Ran Out of Food in the Last  Year: Never true  Transportation Needs: No Transportation Needs (04/24/2023)   PRAPARE - Administrator, Civil Service (Medical): No    Lack of Transportation (Non-Medical): No  Physical Activity: Insufficiently Active (04/24/2023)   Exercise Vital Sign    Days of Exercise per Week: 7 days    Minutes of Exercise per Session: 20 min  Stress: No Stress Concern Present (04/24/2023)   [employer] of Occupational Health - Occupational Stress Questionnaire    Feeling of Stress : Only a little  Social Connections: Moderately Integrated (04/24/2023)   Social Connection and Isolation Panel [NHANES]    Frequency of Communication with Friends and Family: Twice a week    Frequency of Social Gatherings with Friends and Family: Once a week    Attends Religious Services: Never    Database administrator or Organizations: Yes    Attends Engineer, structural: More than 4 times per year    Marital Status: Married  Catering manager Violence: Not At Risk (04/08/2023)   Humiliation, Afraid, Rape, and Kick questionnaire    Fear of Current or Ex-Partner: No    Emotionally Abused: No    Physically Abused: No    Sexually Abused: No    FAMILY HISTORY: Family History  Problem Relation Age of Onset   Cancer Father    Heart disease Brother    Dementia Brother    Leukemia Brother    Breast cancer Neg Hx    Colon cancer Neg Hx    Esophageal cancer Neg Hx    Pancreatic cancer Neg Hx    Rectal cancer Neg Hx    Stomach cancer Neg Hx     ALLERGIES:  is allergic to poison ivy extract, plavix [clopidogrel bisulfate], and other.  MEDICATIONS:  Current Outpatient Medications  Medication Sig Dispense Refill   acyclovir (ZOVIRAX) 400 MG tablet Take 1 tablet (400 mg total) by mouth 2 (two) times daily. 60 tablet 3   aspirin 81 MG tablet Take 81 mg by mouth daily.      atorvastatin (LIPITOR) 80 MG tablet TAKE 1 TABLET(80 MG) BY MOUTH DAILY AT 6 PM 90 tablet 1   benazepril (LOTENSIN) 10 MG  tablet TAKE 1 TABLET(10 MG) BY MOUTH DAILY 90 tablet 1   Blood Glucose Monitoring Suppl DEVI 1 each by Does not apply route as needed (symptoms of low blood sugar.). May substitute to any manufacturer covered by patient's insurance. 1 each 0   calcium carbonate (TUMS - DOSED IN MG ELEMENTAL CALCIUM) 500 MG chewable tablet Chew 1 tablet by mouth daily. Daily PRN     cholecalciferol (VITAMIN D3) 25 MCG (1000 UNIT) tablet Take 2,000 Units by mouth daily.     gabapentin (NEURONTIN) 100 MG capsule TAKE 1 CAPSULE(100 MG) BY MOUTH TWICE DAILY AS NEEDED 180 capsule 1   levothyroxine (SYNTHROID) 25 MCG tablet TAKE 1  TABLET(25 MCG) BY MOUTH DAILY BEFORE BREAKFAST 90 tablet 3   Magnesium 100 MG TABS Take 100 mg by mouth daily.     polyethylene glycol (MIRALAX / GLYCOLAX) 17 g packet Take 17 g by mouth daily as needed.     pomalidomide (POMALYST) 2 MG capsule Take 1 capsule (2 mg total) by mouth daily. Siri Cole #[id number]     Date Obtained 06/11/23 Take 1 capsule daily for 21 days and none for 7 days 21 capsule 0   No current facility-administered medications for this visit.   Facility-Administered Medications Ordered in Other Visits  Medication Dose Route Frequency Provider Last Rate Last Admin   cetirizine (ZYRTEC) tablet 10 mg  10 mg Oral Daily Ulysees Barns IV, MD   10 mg at 07/03/23 1221    REVIEW OF SYSTEMS:   Constitutional: ( - ) fevers, ( - )  chills , ( - ) night sweats Eyes: ( - ) blurriness of vision, ( - ) double vision, ( - ) watery eyes Ears, nose, mouth, throat, and face: ( - ) mucositis, ( - ) sore throat Respiratory: ( - ) cough, ( - ) dyspnea, ( - ) wheezes Cardiovascular: ( - ) palpitation, ( - ) chest discomfort, ( - ) lower extremity swelling Gastrointestinal:  ( - ) nausea, ( - ) heartburn, ( - ) change in bowel habits Skin: ( - ) abnormal skin rashes Lymphatics: ( - ) new lymphadenopathy, ( - ) easy bruising Neurological: ( +) numbness, ( - ) tingling, ( - ) new  weaknesses Behavioral/Psych: ( - ) mood change, ( - ) new changes  All other systems were reviewed with the patient and are negative.  PHYSICAL EXAMINATION: ECOG PERFORMANCE STATUS: 1 - Symptomatic but completely ambulatory  Vitals:   07/03/23 1026  BP: 125/73  Pulse: 68  Resp: 13  Temp: 98.6 F (37 C)  SpO2: 100%    Filed Weights   07/03/23 1026  Weight: 138 lb 6.4 oz (62.8 kg)     GENERAL: well appearing elderly Caucasian female in NAD  SKIN: skin color, texture, turgor are normal, no rashes or significant lesions EYES: conjunctiva are pink and non-injected, sclera clear LUNGS: clear to auscultation and percussion with normal breathing effort HEART: regular rate & rhythm and no murmurs and trace lower extremity edema Musculoskeletal: no cyanosis of digits and no clubbing  PSYCH: alert & oriented x 3, fluent speech NEURO: no focal motor/sensory deficits  LABORATORY DATA:  I have reviewed the data as listed    Latest Ref Rng & Units 07/03/2023   10:05 AM 06/19/2023    8:10 AM 06/05/2023   10:00 AM  CBC  WBC 4.0 - 10.5 K/uL 5.7  4.5  4.5   Hemoglobin 12.0 - 15.0 g/dL 09.3  23.5  57.3   Hematocrit 36.0 - 46.0 % 36.6  37.5  36.0   Platelets 150 - 400 K/uL 267  203  273        Latest Ref Rng & Units 07/03/2023   10:05 AM 06/19/2023    8:10 AM 06/05/2023   10:00 AM  CMP  Glucose 70 - 99 mg/dL 83  74  80   BUN 8 - 23 mg/dL 14  15  18    Creatinine 0.44 - 1.00 mg/dL 2.20  2.54  2.70   Sodium 135 - 145 mmol/L 138  139  139   Potassium 3.5 - 5.1 mmol/L 4.2  3.8  4.1   Chloride  98 - 111 mmol/L 105  107  106   CO2 22 - 32 mmol/L 28  27  29    Calcium 8.9 - 10.3 mg/dL 9.8  9.9  16.1   Total Protein 6.5 - 8.1 g/dL 6.4  6.4  6.3   Total Bilirubin 0.3 - 1.2 mg/dL 0.6  0.7  0.6   Alkaline Phos 38 - 126 U/L 50  55  46   AST 15 - 41 U/L 20  21  20    ALT 0 - 44 U/L 21  22  22      Lab Results  Component Value Date   MPROTEIN 0.3 (H) 06/19/2023   MPROTEIN 0.2 (H) 06/05/2023    MPROTEIN 0.2 (H) 05/22/2023   Lab Results  Component Value Date   KPAFRELGTCHN 10.8 06/19/2023   KPAFRELGTCHN 9.7 06/05/2023   KPAFRELGTCHN 12.0 05/22/2023   LAMBDASER 118.3 (H) 06/19/2023   LAMBDASER 87.0 (H) 06/05/2023   LAMBDASER 86.5 (H) 05/22/2023   KAPLAMBRATIO 0.09 (L) 06/19/2023   KAPLAMBRATIO 0.11 (L) 06/05/2023   KAPLAMBRATIO 0.14 (L) 05/22/2023    RADIOGRAPHIC STUDIES: No results found.  ASSESSMENT & PLAN Crytal Jasinski 77 y.o. female with medical history significant for lambda light chain multiple myeloma who presents for a follow up visit. Ms. Poppert is not a candidate for bone marrow transplant based on her advanced age.     # Lambda Light Chain Multiple Myeloma--VGPR on maintenance --diagnosis confirmed with bone marrow biopsy and urine protein analysis.   --patient has good functional status and would be an excellent candidate for VRd chemotherapy. I do not believe she would be a bone marrow transplant candidate based on her age.  --Received VRD chemotherapy from 02/01/2021-07/12/2021 then started maintenance Revlimid on 07/19/2021.  --Due to rising lambda light chain, recommended to switch to Daratumumab/Pomalyst/Dexamethasone starting today, 03/27/2023. --Started Cycle 1, Day 1 of Dara/Pom/Dex on 03/27/2023 Plan: --Due to start Cycle 4, Day 1 of Dara/Pom/Dex today --Labs today show white blood cell count 5.7, Hgb 12.4, MCV 91.3, Plt 267.  Creatinine and LFTs are within normal limits. -- Patient is currently on Pomalyst 2 mg p.o.  She did become neutropenic on higher doses.  --Return to clinic weekly for labs/treatment and follow up visit in 2 weeks  #Hypoglycemia: --Glucose was 83, non-fasting --Patient is asymptomatic today. Encouraged to eat right before treatment and snacks every 2-3 hours.  --Patient will check her glucose levels on non-treatment days and follow up with PCP if non-fasting glucose is consistently 70 or below.  #Back Pain/Left Hip Pain  --  Currently well controlled on gabapentin and Tylenol --CT thoracic and lumbar spine showed no lytic lesions or sequelae of multiple myeloma --MRI thoracic and lumbar spine from 07/07/2022 for baseline imaging. Findings showed multiple bone lesions involving posterior T9 and T8, lumbar spine and upper sacrum consistent with multiple myeloma. No extension into the canal. No pathologic fracture.  --continue follow up with Dr .Donalee Citrin, neurosugery --Continue to monitor  #Supportive Care --chemotherapy education complete  --zofran 8mg  q8H PRN and compazine 10mg  PO q6H for nausea -- acyclovir 400mg  PO BID for VCZ prophylaxis --zometa therapy last received on 02/24/2023. Next due in July 2024. -- no prescription pain medication required at this time.   No orders of the defined types were placed in this encounter.  All questions were answered. The patient knows to call the clinic with any problems, questions or concerns.  I have spent a total of 30 minutes minutes of face-to-face and  non-face-to-face time, preparing to see the patient, performing a medically appropriate examination, counseling and educating the patient, documenting clinical information in the electronic health record.   Ulysees Barns, MD Department of Hematology/Oncology [facility] Phone: [phone] Pager: [phone] Email: [email or web address]  07/03/2023 3:48 PM

## 2023-07-03 NOTE — Patient Instructions (Signed)

## 2023-07-08 ENCOUNTER — Encounter: Payer: Self-pay | Admitting: Family Medicine

## 2023-07-09 ENCOUNTER — Ambulatory Visit (INDEPENDENT_AMBULATORY_CARE_PROVIDER_SITE_OTHER): Payer: Medicare Other | Admitting: Family Medicine

## 2023-07-09 ENCOUNTER — Encounter: Payer: Self-pay | Admitting: Family Medicine

## 2023-07-09 VITALS — BP 122/60 | HR 57 | Temp 98.8°F | Ht 66.0 in | Wt 137.6 lb

## 2023-07-09 DIAGNOSIS — E039 Hypothyroidism, unspecified: Secondary | ICD-10-CM

## 2023-07-09 DIAGNOSIS — E782 Mixed hyperlipidemia: Secondary | ICD-10-CM

## 2023-07-09 DIAGNOSIS — C9000 Multiple myeloma not having achieved remission: Secondary | ICD-10-CM | POA: Diagnosis not present

## 2023-07-09 DIAGNOSIS — I10 Essential (primary) hypertension: Secondary | ICD-10-CM | POA: Diagnosis not present

## 2023-07-09 LAB — TSH: TSH: 1.63 u[IU]/mL (ref 0.35–5.50)

## 2023-07-09 LAB — LIPID PANEL
Cholesterol: 151 mg/dL (ref 0–200)
HDL: 76.4 mg/dL (ref >39.00)
LDL Cholesterol: 63 mg/dL (ref 0–99)
NonHDL: 74.3
Total CHOL/HDL Ratio: 2
Triglycerides: 56 mg/dL (ref 0.0–149.0)
VLDL: 11.2 mg/dL (ref 0.0–40.0)

## 2023-07-09 MED ORDER — LEVOTHYROXINE SODIUM 25 MCG PO TABS
ORAL_TABLET | ORAL | 3 refills | Status: DC
Start: 2023-07-09 — End: 2023-09-15

## 2023-07-09 MED ORDER — BENAZEPRIL HCL 10 MG PO TABS
ORAL_TABLET | ORAL | 1 refills | Status: DC
Start: 2023-07-09 — End: 2024-01-07

## 2023-07-09 MED ORDER — ATORVASTATIN CALCIUM 80 MG PO TABS
ORAL_TABLET | ORAL | 1 refills | Status: DC
Start: 2023-07-09 — End: 2023-12-17

## 2023-07-09 NOTE — Patient Instructions (Signed)
No med changes today.  I will let you know if any concerns on labs.  Hang in there!

## 2023-07-09 NOTE — Progress Notes (Signed)
Subjective:  Patient ID: Doris Wilkerson, female    DOB: Apr 15, 1946  Age: 77 y.o. MRN: [id number]  CC:  Chief Complaint  Patient presents with   Medical Management of Chronic Issues    Pt is doing well, no concerns     HPI Doris Wilkerson presents for    Hypoglycemia Discussed in June.  Readings in the 60s at times, possibly symptomatic during that time and on the way to treatments.  She had noticed some certain foods and their impact on her blood sugar.  However overall data from a continuous glucose monitor indicated normal readings.  Question impact of her medications, current treatments for multiple myeloma.  Referred to endocrinology to decide on further workup but infrequent symptoms.  Hypoglycemia treatment discussed if that were to occur.  Glucose 83 at her August 9 hematology visit.  Asymptomatic.  Plan for snacks before treatment and snacks every 2-3 hours. No recent issues. Home testing has been ok. No recent lows. Breakfast cereal may have been issue.   Multiple myeloma Followed by Dr. Leonides Schanz, last visit August 9, chemotherapy from March 2022 through August 22 2, maintenance Revlimid at that time, then started on Darzalex treatment starting May 3 due to rising lambda light chain.  Cycle 4 started on August 9.  Pomalyst 2 mg, neutropenia on higher doses, biweekly treatment, labs planned.  Zofran, Compazine as needed for nausea, acyclovir for VCZ prophylaxis. S/p Zometa. No bone pain.  Gabapentin for neuropathy symptoms.  Hyperlipidemia: With history of CAD, followed by cardiologist Dr. Jacinto Halim, treated with Lipitor 80 mg daily. No new CP. Dyspnea with Darzalex, noted on fatigue day after treatment.  Recent CMP.  Lab Results  Component Value Date   CHOL 154 01/08/2023   HDL 74.80 01/08/2023   LDLCALC 54 01/08/2023   TRIG 125.0 01/08/2023   CHOLHDL 2 01/08/2023   Lab Results  Component Value Date   ALT 21 07/03/2023   AST 20 07/03/2023   ALKPHOS 50 07/03/2023    BILITOT 0.6 07/03/2023   Hypertension: Benazepril 10 mg daily. No new side effects.  Home readings: none BP Readings from Last 3 Encounters:  07/09/23 122/60  07/03/23 125/73  06/19/23 129/67   Lab Results  Component Value Date   CREATININE 0.79 07/03/2023   Hypothyroidism: Lab Results  Component Value Date   TSH 1.29 01/08/2023  Taking medication daily.  Synthroid 25 mcg No new hot or cold intolerance. No new hair or skin changes, heart palpitations or new fatigue. No new weight changes. No new sx's other than chemo side effects.     History Patient Active Problem List   Diagnosis Date Noted   Mixed hyperlipidemia 01/11/2023   Essential hypertension 01/11/2023   Physical debility 03/21/2021   Dizziness 03/21/2021   Vertigo 03/21/2021   Anemia due to antineoplastic chemotherapy 03/21/2021   Skin changes related to chemotherapy 03/21/2021   Other constipation 03/21/2021   Multiple myeloma not having achieved remission (HCC) 01/27/2021   Hordeolum externum of left lower eyelid 06/28/2018   CAD (coronary artery disease) 12/09/2011   Dyslipidemia 12/09/2011   Osteopenia 12/09/2011   Hypothyroid 12/09/2011   Asthma 12/09/2011   Past Medical History:  Diagnosis Date   Asthma    Cataract    GERD (gastroesophageal reflux disease)    Hyperlipidemia    Hypertension    Thyroid disease    Past Surgical History:  Procedure Laterality Date   Cataract Surgery     CORONARY ANGIOPLASTY WITH STENT  PLACEMENT     EYE SURGERY     ORTHOPEDIC SURGERY  2012   TONSILLECTOMY  1951   Allergies  Allergen Reactions   Poison Ivy Extract Swelling   Plavix [Clopidogrel Bisulfate]    Other     seasonal   Prior to Admission medications   Medication Sig Start Date End Date Taking? Authorizing Provider  acyclovir (ZOVIRAX) 400 MG tablet Take 1 tablet (400 mg total) by mouth 2 (two) times daily. 03/27/23   Briant Cedar, PA-C  aspirin 81 MG tablet Take 81 mg by mouth daily.      Dois Davenport, MD  atorvastatin (LIPITOR) 80 MG tablet TAKE 1 TABLET(80 MG) BY MOUTH DAILY AT 6 PM 06/22/23   Shade Flood, MD  benazepril (LOTENSIN) 10 MG tablet TAKE 1 TABLET(10 MG) BY MOUTH DAILY 05/14/23   Shade Flood, MD  Blood Glucose Monitoring Suppl DEVI 1 each by Does not apply route as needed (symptoms of low blood sugar.). May substitute to any manufacturer covered by patient's insurance. 05/11/23   Shade Flood, MD  calcium carbonate (TUMS - DOSED IN MG ELEMENTAL CALCIUM) 500 MG chewable tablet Chew 1 tablet by mouth daily. Daily PRN    [provider]  cholecalciferol (VITAMIN D3) 25 MCG (1000 UNIT) tablet Take 2,000 Units by mouth daily.    [provider]  gabapentin (NEURONTIN) 100 MG capsule TAKE 1 CAPSULE(100 MG) BY MOUTH TWICE DAILY AS NEEDED 04/14/23   Shade Flood, MD  levothyroxine (SYNTHROID) 25 MCG tablet TAKE 1 TABLET(25 MCG) BY MOUTH DAILY BEFORE BREAKFAST 12/24/22   Shade Flood, MD  Magnesium 100 MG TABS Take 100 mg by mouth daily.    [provider]  polyethylene glycol (MIRALAX / GLYCOLAX) 17 g packet Take 17 g by mouth daily as needed.    [provider]  pomalidomide (POMALYST) 2 MG capsule Take 1 capsule (2 mg total) by mouth daily. Siri Cole #[id number]     Date Obtained 06/11/23 Take 1 capsule daily for 21 days and none for 7 days 06/22/23   Jaci Standard, MD   Social History   Socioeconomic History   Marital status: Married    Spouse name: Not on file   Number of children: 0   Years of education: Not on file   Highest education level: Master's degree (e.g., MA, MS, MEng, MEd, MSW, MBA)  Occupational History   Occupation: unemployed  Tobacco Use   Smoking status: Never   Smokeless tobacco: Never  Vaping Use   Vaping status: Never Used  Substance and Sexual Activity   Alcohol use: Yes    Alcohol/week: 1.0 standard drink of alcohol    Types: 1 Glasses of wine per week    Comment:  occassionally   Drug use: No   Sexual activity: Not Currently  Other Topics Concern   Not on file  Social History Narrative   Married. Education: college. Pt does exercise-   Social Determinants of Health   Financial Resource Strain: Low Risk  (04/24/2023)   Overall Financial Resource Strain (CARDIA)    Difficulty of Paying Living Expenses: Not hard at all  Food Insecurity: No Food Insecurity (04/24/2023)   Hunger Vital Sign    Worried About Running Out of Food in the Last Year: Never true    Ran Out of Food in the Last Year: Never true  Transportation Needs: No Transportation Needs (04/24/2023)   PRAPARE - Transportation  Lack of Transportation (Medical): No    Lack of Transportation (Non-Medical): No  Physical Activity: Insufficiently Active (04/24/2023)   Exercise Vital Sign    Days of Exercise per Week: 7 days    Minutes of Exercise per Session: 20 min  Stress: No Stress Concern Present (04/24/2023)   [employer] of Occupational Health - Occupational Stress Questionnaire    Feeling of Stress : Only a little  Social Connections: Moderately Integrated (04/24/2023)   Social Connection and Isolation Panel [NHANES]    Frequency of Communication with Friends and Family: Twice a week    Frequency of Social Gatherings with Friends and Family: Once a week    Attends Religious Services: Never    Database administrator or Organizations: Yes    Attends Engineer, structural: More than 4 times per year    Marital Status: Married  Catering manager Violence: Not At Risk (04/08/2023)   Humiliation, Afraid, Rape, and Kick questionnaire    Fear of Current or Ex-Partner: No    Emotionally Abused: No    Physically Abused: No    Sexually Abused: No    Review of Systems 13 point review of systems per patient health survey noted.  Negative other than as indicated above or in HPI.    Objective:   Vitals:   07/09/23 0828  BP: 122/60  Pulse: (!) 57  Temp: 98.8 F (37.1 C)   TempSrc: Temporal  SpO2: 99%  Weight: 137 lb 9.6 oz (62.4 kg)  Height: 5\' 6"  (1.676 m)     Physical Exam Vitals reviewed.  Constitutional:      Appearance: Normal appearance. She is well-developed.  HENT:     Head: Normocephalic and atraumatic.  Eyes:     Conjunctiva/sclera: Conjunctivae normal.     Pupils: Pupils are equal, round, and reactive to light.  Neck:     Vascular: No carotid bruit.  Cardiovascular:     Rate and Rhythm: Normal rate and regular rhythm.     Heart sounds: Normal heart sounds.  Pulmonary:     Effort: Pulmonary effort is normal.     Breath sounds: Normal breath sounds.  Abdominal:     Palpations: Abdomen is soft. There is no pulsatile mass.     Tenderness: There is no abdominal tenderness.  Musculoskeletal:     Right lower leg: No edema.     Left lower leg: No edema.  Skin:    General: Skin is warm and dry.  Neurological:     Mental Status: She is alert and oriented to person, place, and time.  Psychiatric:        Mood and Affect: Mood normal.        Behavior: Behavior normal.     Assessment & Plan:  Doris Wilkerson is a 77 y.o. female . Hypothyroidism, unspecified type - Plan: TSH  - Stable, tolerating current regimen. Medications refilled. Labs pending as above.   Mixed hyperlipidemia - Plan: Lipid panel, atorvastatin (LIPITOR) 80 MG tablet  -  Stable, tolerating current regimen. Medications refilled. Labs pending as above.   Essential hypertension - Plan: benazepril (LOTENSIN) 10 MG tablet  -  Stable, tolerating current regimen. Medications refilled. Labs pending as above.  Fatigue, dyspnea with activity after chemo, denies new symptoms or other exertional symptoms apart from chemotherapy/med side effects.  Ongoing follow-up with cardiology with RTC precautions given.  Multiple myeloma not having achieved remission (HCC)  -On treatment as above, continue follow-up with hematology.  Hyperlipemia,  mixed - Plan: atorvastatin (LIPITOR) 80  MG tablet, benazepril (LOTENSIN) 10 MG tablet As above  Meds ordered this encounter  Medications   atorvastatin (LIPITOR) 80 MG tablet    Sig: TAKE 1 TABLET(80 MG) BY MOUTH DAILY AT 6 PM    Dispense:  90 tablet    Refill:  1    Ok to place on hold   benazepril (LOTENSIN) 10 MG tablet    Sig: TAKE 1 TABLET(10 MG) BY MOUTH DAILY    Dispense:  90 tablet    Refill:  1    Ok to place on hold   Patient Instructions  No med changes today.  I will let you know if any concerns on labs.  Hang in there!    Signed,   Meredith Staggers, MD [facility], [facility] 07/09/23 8:57 AM

## 2023-07-13 ENCOUNTER — Encounter: Payer: Self-pay | Admitting: Cardiology

## 2023-07-13 ENCOUNTER — Ambulatory Visit: Payer: Medicare Other | Admitting: Cardiology

## 2023-07-13 VITALS — BP 141/76 | HR 64 | Resp 16 | Ht 66.0 in | Wt 137.2 lb

## 2023-07-13 DIAGNOSIS — E7800 Pure hypercholesterolemia, unspecified: Secondary | ICD-10-CM

## 2023-07-13 DIAGNOSIS — I10 Essential (primary) hypertension: Secondary | ICD-10-CM | POA: Diagnosis not present

## 2023-07-13 DIAGNOSIS — I25118 Atherosclerotic heart disease of native coronary artery with other forms of angina pectoris: Secondary | ICD-10-CM

## 2023-07-13 MED ORDER — NITROGLYCERIN 0.4 MG SL SUBL
0.4000 mg | SUBLINGUAL_TABLET | SUBLINGUAL | 1 refills | Status: AC | PRN
Start: 2023-07-13 — End: 2024-10-10

## 2023-07-13 MED ORDER — AMLODIPINE BESYLATE 5 MG PO TABS: 5.0000 mg | ORAL_TABLET | Freq: Every day | ORAL | 2 refills | Status: DC

## 2023-07-13 NOTE — Progress Notes (Signed)
Primary Physician/Referring:  Shade Flood, MD  Patient ID: Doris Wilkerson, female    DOB: April 24, 1946, 77 y.o.   MRN: [id number]  No chief complaint on file.  HPI:    Doris Wilkerson  is a 77 y.o. 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  Past medical history significant for hypertension, hyperlipidemia. 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  Patient made an appointment to see me due to new onset of angina pectoris, chest pain symptoms with exertional activity that started about 2 months ago that come on and off and easily relieved with rest.  Symptoms are consistent mild, occasionally she has noticed mild dyspnea on exertion as well.  States that her symptoms are not very concerning but she wanted to be checked out.  No PND or orthopnea, no leg edema, no dizziness or syncope.  Past Medical History:  Diagnosis Date   Asthma    Cataract    GERD (gastroesophageal reflux disease)    Hyperlipidemia    Hypertension    Thyroid disease    Social History   Tobacco Use   Smoking status: Never   Smokeless tobacco: Never  Substance Use Topics   Alcohol use: Yes    Alcohol/week: 1.0 standard drink of alcohol    Types: 1 Glasses of wine per week    Comment: occassionally   Marital Status: Married  ROS  Review of Systems  Cardiovascular:  Positive for chest pain and dyspnea on exertion. Negative for claudication and leg swelling.  Neurological:  Positive for numbness (feet) and paresthesias.   Objective  There were no vitals taken for this visit. There is no height or weight on file to calculate BMI.     07/09/2023    8:28 AM 07/03/2023   10:26 AM 06/19/2023    8:55 AM  Vitals with BMI  Height 5\' 6"     Weight 137 lbs 10 oz 138 lbs 6 oz 138 lbs  BMI 22.22 22.35 22.28  Systolic 122  125 129  Diastolic 60 73 67  Pulse 57 68 72    Physical Exam Neck:     Vascular: No carotid bruit or JVD.  Cardiovascular:     Rate and Rhythm: Normal rate and regular rhythm.     Pulses: Intact distal pulses.     Heart sounds: Normal heart sounds. No murmur heard.    No gallop.  Pulmonary:     Effort: Pulmonary effort is normal.     Breath sounds: Normal breath sounds.  Abdominal:     General: Bowel sounds are normal.     Palpations: Abdomen is soft.  Musculoskeletal:     Right lower leg: No edema.     Left lower leg: No edema.     Laboratory examination:   Recent Labs    06/05/23 1000 06/19/23 0810 07/03/23 1005  NA 139 139 138  K 4.1 3.8 4.2  CL 106 107 105  CO2 29 27 28   GLUCOSE 80 74 83  BUN 18 15 14   CREATININE 0.86 0.80 0.79  CALCIUM 10.1 9.9 9.8  GFRNONAA >60 >60 >60      Latest Ref Rng & Units 07/03/2023   10:05 AM 06/19/2023    8:10 AM 06/05/2023   10:00 AM  CMP  Glucose 70 - 99 mg/dL 83  74  80  BUN 8 - 23 mg/dL 14  15  18    Creatinine 0.44 - 1.00 mg/dL 9.56  2.13  0.86   Sodium 135 - 145 mmol/L 138  139  139   Potassium 3.5 - 5.1 mmol/L 4.2  3.8  4.1   Chloride 98 - 111 mmol/L 105  107  106   CO2 22 - 32 mmol/L 28  27  29    Calcium 8.9 - 10.3 mg/dL 9.8  9.9  57.8   Total Protein 6.5 - 8.1 g/dL 6.4  6.4  6.3   Total Bilirubin 0.3 - 1.2 mg/dL 0.6  0.7  0.6   Alkaline Phos 38 - 126 U/L 50  55  46   AST 15 - 41 U/L 20  21  20    ALT 0 - 44 U/L 21  22  22        Latest Ref Rng & Units 07/03/2023   10:05 AM 06/19/2023    8:10 AM 06/05/2023   10:00 AM  CBC  WBC 4.0 - 10.5 K/uL 5.7  4.5  4.5   Hemoglobin 12.0 - 15.0 g/dL 46.9  62.9  52.8   Hematocrit 36.0 - 46.0 % 36.6  37.5  36.0   Platelets 150 - 400 K/uL 267  203  273     Lipid Panel Recent Labs    01/08/23 1121 07/09/23 0903  CHOL 154 151  TRIG 125.0 56.0  LDLCALC 54 63  VLDL 25.0 11.2  HDL 74.80 76.40  CHOLHDL 2 2   TSH Recent Labs    01/08/23 1121 07/09/23 0903  TSH 1.29 1.63    Radiology:   CT thoracic spine with contrast 03/27/2022: Treated disease of the posterior T9 vertebral body with sequela of previous pathologic fracture. No sign of residual or recurrent enhancing tumor in this location. No visible canal compromise. This may be sequela of treated disease.  No evidence of definite active or aggressive lesion.  Cardiac Studies:   Coronary angiogram 11/28/2002: Proximal LAD stent: 3.0x18 Cypher.   Lower Extremity Arterial Duplex 02/05/2022: No hemodynamically significant stenoses are identified in the bilateral lower extremity arterial system.  This exam reveals normal perfusion of the right lower extremity (ABI 1.21) and normal perfusion of the left lower extremity (ABI 1.17) with normal triphasic waveform at the level of the ankles. Evaluate for pseudoclaudication.   PCV ECHOCARDIOGRAM COMPLETE 02/05/2022  Normal LV systolic function with EF 74%. Left ventricle cavity is normal in size. Normal left ventricular wall thickness. Normal global wall motion. Normal diastolic filling pattern, normal LAP. Trace aortic regurgitation. No significant valvular heart disease. Compared to study 04/30/2011 no significant change.   PCV MYOCARDIAL PERFUSION WO LEXISCAN 02/03/2022  Lexiscan (with Mod Bruce protocol) Nuclear stress test 02/03/2022: Normal ECG stress. The patient was injected with 0.4 mg of Intravenous Lexiscan over 15 sec. Additionally, patient exercised on a Modified Bruce protocol. Achieved 100% MPHR. Myocardial perfusion is normal. Overall LV systolic function is normal without regional wall motion abnormalities. Stress LV EF: 51%. No previous exam available for comparison. Low risk.    EKG:   EKG 07/13/2023: Normal sinus rhythm at the rate of 54 bpm, left atrial enlargement, left axis deviation, left anterior fascicular block.  LVH.  Compared to 02/23/2023, fourth digit has increased in limb leads.  Left axis deviation not present.  However compared  to 01/10/2022, no change.  Medications and allergies   Allergies  Allergen Reactions   Poison Ivy Extract Swelling   Plavix [Clopidogrel Bisulfate]  Other     seasonal   Medication list after today's encounter    Current Outpatient Medications:    acyclovir (ZOVIRAX) 400 MG tablet, Take 1 tablet (400 mg total) by mouth 2 (two) times daily., Disp: 60 tablet, Rfl: 3   aspirin 81 MG tablet, Take 81 mg by mouth daily. , Disp: , Rfl:    atorvastatin (LIPITOR) 80 MG tablet, TAKE 1 TABLET(80 MG) BY MOUTH DAILY AT 6 PM, Disp: 90 tablet, Rfl: 1   benazepril (LOTENSIN) 10 MG tablet, TAKE 1 TABLET(10 MG) BY MOUTH DAILY, Disp: 90 tablet, Rfl: 1   Blood Glucose Monitoring Suppl DEVI, 1 each by Does not apply route as needed (symptoms of low blood sugar.). May substitute to any manufacturer covered by patient's insurance., Disp: 1 each, Rfl: 0   calcium carbonate (TUMS - DOSED IN MG ELEMENTAL CALCIUM) 500 MG chewable tablet, Chew 1 tablet by mouth daily. Daily PRN, Disp: , Rfl:    cetirizine (ZYRTEC) 10 MG chewable tablet, Chew 10 mg by mouth daily., Disp: , Rfl:    cholecalciferol (VITAMIN D3) 25 MCG (1000 UNIT) tablet, Take 2,000 Units by mouth daily., Disp: , Rfl:    DARATUMUMAB IV, Inject into the vein., Disp: , Rfl:    gabapentin (NEURONTIN) 100 MG capsule, TAKE 1 CAPSULE(100 MG) BY MOUTH TWICE DAILY AS NEEDED, Disp: 180 capsule, Rfl: 1   levothyroxine (SYNTHROID) 25 MCG tablet, TAKE 1 TABLET(25 MCG) BY MOUTH DAILY BEFORE BREAKFAST, Disp: 90 tablet, Rfl: 3   Magnesium 100 MG TABS, Take 100 mg by mouth daily., Disp: , Rfl:    polyethylene glycol (MIRALAX / GLYCOLAX) 17 g packet, Take 17 g by mouth daily as needed., Disp: , Rfl:    pomalidomide (POMALYST) 2 MG capsule, Take 1 capsule (2 mg total) by mouth daily. Siri Cole #[id number]     Date Obtained 06/11/23 Take 1 capsule daily for 21 days and none for 7 days, Disp: 21 capsule, Rfl: 0  Assessment     ICD-10-CM   1. Coronary artery disease  involving native coronary artery of native heart without angina pectoris  I25.10     2. Primary hypertension  I10       There are no discontinued medications.   No orders of the defined types were placed in this encounter.  No orders of the defined types were placed in this encounter.  Recommendations:   Doris Wilkerson is a 77 y.o.  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  Past medical history significant for hypertension, hyperlipidemia. 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  1. Coronary artery disease of native artery of native heart with stable angina pectoris Doris Wilkerson) Patient made an appointment to see me due to new onset of angina pectoris, chest pain symptoms with exertional activity that started about 2 months ago that come on and off and easily relieved with rest or class I-II symptoms of angina.  She has not had any chest pain, symptoms are not consistent, could consider continued medical therapy and observation for now  Nitroglycerin prescription given and also started on 5 mg of amlodipine both for hypertension and angina pectoris. - EKG 12-Lead - nitroGLYCERIN (NITROSTAT) 0.4 MG SL tablet; Place 1 tablet (0.4 mg total) under the tongue every 5 (five) minutes as needed for chest pain.  Dispense: 25 tablet; Refill: 1 -  amLODipine (NORVASC) 5 MG tablet; Take 1 tablet (5 mg total) by mouth daily.  Dispense: 30 tablet; Refill: 2  2. Primary hypertension Blood pressure is elevated, she is presently on an ACE inhibitor, will add amlodipine, patient is very compliant, she will monitor her blood pressure, goal blood pressure <130/80 mmHg.  She will also monitor her chest pain and let me know how she feels, if she has recurrent episodes of chest pain or she has to use frequent nitroglycerin, she  is aware to contact us but she would like to keep an appointment that she had previously set up with [place] in 8 months from now there is April 2025.  I am fine with this.  Do not think she needs any cardiac workup otherwise. - amLODipine (NORVASC) 5 MG tablet; Take 1 tablet (5 mg total) by mouth daily.  Dispense: 30 tablet; Refill: 2  3. Hypercholesteremia Lipids under excellent control.  Continue present medical therapy.  She is presently on 80 mg of atorvastatin.  Other orders - dexamethasone 20 mg in sodium chloride 0.9 % 50 mL; Inject 20 mg into the vein once.   Yates Decamp, MD, Doris Wilkerson 07/13/2023, 3:39 PM Office: [phone]

## 2023-07-15 ENCOUNTER — Other Ambulatory Visit: Payer: Self-pay | Admitting: *Deleted

## 2023-07-15 MED ORDER — POMALIDOMIDE 2 MG PO CAPS: 2.0000 mg | ORAL_CAPSULE | Freq: Every day | ORAL | 0 refills | Status: DC

## 2023-07-17 ENCOUNTER — Inpatient Hospital Stay: Payer: Medicare Other

## 2023-07-17 VITALS — BP 141/76 | HR 62 | Temp 98.2°F | Resp 18

## 2023-07-17 DIAGNOSIS — Z7982 Long term (current) use of aspirin: Secondary | ICD-10-CM | POA: Diagnosis not present

## 2023-07-17 DIAGNOSIS — Z7961 Long term (current) use of immunomodulator: Secondary | ICD-10-CM | POA: Diagnosis not present

## 2023-07-17 DIAGNOSIS — C9000 Multiple myeloma not having achieved remission: Secondary | ICD-10-CM | POA: Diagnosis not present

## 2023-07-17 DIAGNOSIS — Z5112 Encounter for antineoplastic immunotherapy: Secondary | ICD-10-CM | POA: Diagnosis not present

## 2023-07-17 DIAGNOSIS — Z79624 Long term (current) use of inhibitors of nucleotide synthesis: Secondary | ICD-10-CM | POA: Diagnosis not present

## 2023-07-17 DIAGNOSIS — E162 Hypoglycemia, unspecified: Secondary | ICD-10-CM | POA: Diagnosis not present

## 2023-07-17 LAB — CBC WITH DIFFERENTIAL (CANCER CENTER ONLY)
Abs Immature Granulocytes: 0 10*3/uL (ref 0.00–0.07)
Basophils Absolute: 0.1 10*3/uL (ref 0.0–0.1)
Basophils Relative: 2 %
Eosinophils Absolute: 0.5 10*3/uL (ref 0.0–0.5)
Eosinophils Relative: 12 %
HCT: 36.4 % (ref 36.0–46.0)
Hemoglobin: 12.2 g/dL (ref 12.0–15.0)
Immature Granulocytes: 0 %
Lymphocytes Relative: 21 %
Lymphs Abs: 0.8 10*3/uL (ref 0.7–4.0)
MCH: 30.7 pg (ref 26.0–34.0)
MCHC: 33.5 g/dL (ref 30.0–36.0)
MCV: 91.5 fL (ref 80.0–100.0)
Monocytes Absolute: 0.9 10*3/uL (ref 0.1–1.0)
Monocytes Relative: 21 %
Neutro Abs: 1.8 10*3/uL (ref 1.7–7.7)
Neutrophils Relative %: 44 %
Platelet Count: 186 10*3/uL (ref 150–400)
RBC: 3.98 MIL/uL (ref 3.87–5.11)
RDW: 14.6 % (ref 11.5–15.5)
WBC Count: 4.1 10*3/uL (ref 4.0–10.5)
nRBC: 0 % (ref 0.0–0.2)

## 2023-07-17 LAB — CMP (CANCER CENTER ONLY)
ALT: 19 U/L (ref 0–44)
AST: 19 U/L (ref 15–41)
Albumin: 4.2 g/dL (ref 3.5–5.0)
Alkaline Phosphatase: 53 U/L (ref 38–126)
Anion gap: 6 (ref 5–15)
BUN: 14 mg/dL (ref 8–23)
CO2: 24 mmol/L (ref 22–32)
Calcium: 9.2 mg/dL (ref 8.9–10.3)
Chloride: 108 mmol/L (ref 98–111)
Creatinine: 0.72 mg/dL (ref 0.44–1.00)
GFR, Estimated: >60 mL/min (ref >60)
Glucose, Bld: 80 mg/dL (ref 70–99)
Potassium: 3.9 mmol/L (ref 3.5–5.1)
Sodium: 138 mmol/L (ref 135–145)
Total Bilirubin: 0.7 mg/dL (ref 0.3–1.2)
Total Protein: 6.3 g/dL — ABNORMAL LOW (ref 6.5–8.1)

## 2023-07-17 LAB — LACTATE DEHYDROGENASE: LDH: 132 U/L (ref 98–192)

## 2023-07-17 MED ORDER — DARATUMUMAB-HYALURONIDASE-FIHJ 1800-30000 MG-UT/15ML ~~LOC~~ SOLN
1800.0000 mg | Freq: Once | SUBCUTANEOUS | Status: AC
  Administered 2023-07-17: 1800 mg via SUBCUTANEOUS
  Filled 2023-07-17: qty 15

## 2023-07-17 MED ORDER — DEXAMETHASONE 4 MG PO TABS
20.0000 mg | ORAL_TABLET | Freq: Once | ORAL | Status: AC
  Administered 2023-07-17: 20 mg via ORAL
  Filled 2023-07-17: qty 5

## 2023-07-17 MED ORDER — CETIRIZINE HCL 10 MG PO TABS
10.0000 mg | ORAL_TABLET | Freq: Every day | ORAL | Status: DC
  Administered 2023-07-17: 10 mg via ORAL
  Filled 2023-07-17: qty 1

## 2023-07-17 MED ORDER — ACETAMINOPHEN 325 MG PO TABS
650.0000 mg | ORAL_TABLET | Freq: Once | ORAL | Status: AC
  Administered 2023-07-17: 650 mg via ORAL
  Filled 2023-07-17: qty 2

## 2023-07-20 LAB — KAPPA/LAMBDA LIGHT CHAINS
Kappa free light chain: 8.5 mg/L (ref 3.3–19.4)
Kappa, lambda light chain ratio: 0.06 — ABNORMAL LOW (ref 0.26–1.65)
Lambda free light chains: 132.4 mg/L — ABNORMAL HIGH (ref 5.7–26.3)

## 2023-07-22 ENCOUNTER — Telehealth: Payer: Self-pay | Admitting: *Deleted

## 2023-07-22 ENCOUNTER — Other Ambulatory Visit: Payer: Self-pay

## 2023-07-22 ENCOUNTER — Other Ambulatory Visit (HOSPITAL_COMMUNITY): Payer: Self-pay

## 2023-07-22 ENCOUNTER — Other Ambulatory Visit: Payer: Self-pay | Admitting: Hematology and Oncology

## 2023-07-22 NOTE — Telephone Encounter (Signed)
Received message that pt needed a call regarding her pre-meds for her treatments here. TCT to her . Spoke with her.  Reviewed her pre-meds for the months of May, June. July and August. She had questions when reviewing the billing statements.  Clarified her pre-meds. Pt is to only receive Zyrtec 10 mg, Tylenol 650 mg and Dexamethasone 20 mg  Pt is not to receive Benadryl as it makes her too sleepy for a couple of days.

## 2023-07-22 NOTE — Telephone Encounter (Signed)
See previous note

## 2023-07-23 LAB — MULTIPLE MYELOMA PANEL, SERUM
Albumin SerPl Elph-Mcnc: 4 g/dL (ref 2.9–4.4)
Albumin/Glob SerPl: 2 — ABNORMAL HIGH (ref 0.7–1.7)
Alpha 1: 0.2 g/dL (ref 0.0–0.4)
Alpha2 Glob SerPl Elph-Mcnc: 0.7 g/dL (ref 0.4–1.0)
B-Globulin SerPl Elph-Mcnc: 0.8 g/dL (ref 0.7–1.3)
Gamma Glob SerPl Elph-Mcnc: 0.4 g/dL (ref 0.4–1.8)
Globulin, Total: 2.1 g/dL — ABNORMAL LOW (ref 2.2–3.9)
IgA: 51 mg/dL — ABNORMAL LOW (ref 64–422)
IgG (Immunoglobin G), Serum: 452 mg/dL — ABNORMAL LOW (ref 586–1602)
IgM (Immunoglobulin M), Srm: 10 mg/dL — ABNORMAL LOW (ref 26–217)
M Protein SerPl Elph-Mcnc: 0.2 g/dL — ABNORMAL HIGH
Total Protein ELP: 6.1 g/dL (ref 6.0–8.5)

## 2023-07-24 DIAGNOSIS — Z23 Encounter for immunization: Secondary | ICD-10-CM | POA: Diagnosis not present

## 2023-07-31 ENCOUNTER — Inpatient Hospital Stay: Payer: Medicare Other

## 2023-07-31 ENCOUNTER — Inpatient Hospital Stay (HOSPITAL_BASED_OUTPATIENT_CLINIC_OR_DEPARTMENT_OTHER): Payer: Medicare Other | Admitting: Hematology and Oncology

## 2023-07-31 ENCOUNTER — Inpatient Hospital Stay: Payer: Medicare Other | Attending: Physician Assistant

## 2023-07-31 VITALS — BP 115/76 | HR 60 | Temp 98.3°F | Resp 13 | Wt 138.8 lb

## 2023-07-31 DIAGNOSIS — Z7982 Long term (current) use of aspirin: Secondary | ICD-10-CM | POA: Insufficient documentation

## 2023-07-31 DIAGNOSIS — C9000 Multiple myeloma not having achieved remission: Secondary | ICD-10-CM

## 2023-07-31 DIAGNOSIS — Z79899 Other long term (current) drug therapy: Secondary | ICD-10-CM | POA: Diagnosis not present

## 2023-07-31 DIAGNOSIS — M899 Disorder of bone, unspecified: Secondary | ICD-10-CM | POA: Diagnosis not present

## 2023-07-31 DIAGNOSIS — Z5112 Encounter for antineoplastic immunotherapy: Secondary | ICD-10-CM | POA: Diagnosis present

## 2023-07-31 DIAGNOSIS — Z79624 Long term (current) use of inhibitors of nucleotide synthesis: Secondary | ICD-10-CM | POA: Diagnosis not present

## 2023-07-31 DIAGNOSIS — Z7961 Long term (current) use of immunomodulator: Secondary | ICD-10-CM | POA: Diagnosis not present

## 2023-07-31 LAB — CBC WITH DIFFERENTIAL (CANCER CENTER ONLY)
Abs Immature Granulocytes: 0.02 10*3/uL (ref 0.00–0.07)
Basophils Absolute: 0.1 10*3/uL (ref 0.0–0.1)
Basophils Relative: 2 %
Eosinophils Absolute: 0.4 10*3/uL (ref 0.0–0.5)
Eosinophils Relative: 6 %
HCT: 37.2 % (ref 36.0–46.0)
Hemoglobin: 12.5 g/dL (ref 12.0–15.0)
Immature Granulocytes: 0 %
Lymphocytes Relative: 14 %
Lymphs Abs: 0.9 10*3/uL (ref 0.7–4.0)
MCH: 31.2 pg (ref 26.0–34.0)
MCHC: 33.6 g/dL (ref 30.0–36.0)
MCV: 92.8 fL (ref 80.0–100.0)
Monocytes Absolute: 0.4 10*3/uL (ref 0.1–1.0)
Monocytes Relative: 6 %
Neutro Abs: 4.8 10*3/uL (ref 1.7–7.7)
Neutrophils Relative %: 72 %
Platelet Count: 259 10*3/uL (ref 150–400)
RBC: 4.01 MIL/uL (ref 3.87–5.11)
RDW: 14.6 % (ref 11.5–15.5)
WBC Count: 6.6 10*3/uL (ref 4.0–10.5)
nRBC: 0 % (ref 0.0–0.2)

## 2023-07-31 LAB — CMP (CANCER CENTER ONLY)
ALT: 16 U/L (ref 0–44)
AST: 17 U/L (ref 15–41)
Albumin: 4.3 g/dL (ref 3.5–5.0)
Alkaline Phosphatase: 61 U/L (ref 38–126)
Anion gap: 4 — ABNORMAL LOW (ref 5–15)
BUN: 15 mg/dL (ref 8–23)
CO2: 28 mmol/L (ref 22–32)
Calcium: 9.6 mg/dL (ref 8.9–10.3)
Chloride: 106 mmol/L (ref 98–111)
Creatinine: 0.73 mg/dL (ref 0.44–1.00)
GFR, Estimated: >60 mL/min (ref >60)
Glucose, Bld: 90 mg/dL (ref 70–99)
Potassium: 4 mmol/L (ref 3.5–5.1)
Sodium: 138 mmol/L (ref 135–145)
Total Bilirubin: 0.6 mg/dL (ref 0.3–1.2)
Total Protein: 6.5 g/dL (ref 6.5–8.1)

## 2023-07-31 LAB — LACTATE DEHYDROGENASE: LDH: 141 U/L (ref 98–192)

## 2023-07-31 MED ORDER — CETIRIZINE HCL 10 MG PO TABS
10.0000 mg | ORAL_TABLET | Freq: Every day | ORAL | Status: DC
  Administered 2023-07-31: 10 mg via ORAL
  Filled 2023-07-31: qty 1

## 2023-07-31 MED ORDER — DEXAMETHASONE 4 MG PO TABS
20.0000 mg | ORAL_TABLET | Freq: Once | ORAL | Status: AC
  Administered 2023-07-31: 20 mg via ORAL
  Filled 2023-07-31: qty 5

## 2023-07-31 MED ORDER — DARATUMUMAB-HYALURONIDASE-FIHJ 1800-30000 MG-UT/15ML ~~LOC~~ SOLN
1800.0000 mg | Freq: Once | SUBCUTANEOUS | Status: AC
  Administered 2023-07-31: 1800 mg via SUBCUTANEOUS
  Filled 2023-07-31: qty 15

## 2023-07-31 MED ORDER — ACETAMINOPHEN 325 MG PO TABS
650.0000 mg | ORAL_TABLET | Freq: Once | ORAL | Status: AC
  Administered 2023-07-31: 650 mg via ORAL
  Filled 2023-07-31: qty 2

## 2023-07-31 NOTE — Patient Instructions (Signed)

## 2023-07-31 NOTE — Progress Notes (Signed)
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 Concerning for multiple myeloma vs metastatic disease 12/31/2020: establish care with Dr. Leonides Schanz. UPEP showed marked Bence Jones protein, findings concerning for multiple myleoma 01/21/2021: bone marrow biopsy confirms multiple myeloma with atypical plasma cells representing 28% of all cells in the aspirate  02/01/2021: Cycle 1 Day 1 of VRd chemotherapy 02/22/2021: Cycle 2 Day 1 of VRd chemotherapy 03/15/2021: Cycle 3 Day 1 of VRd chemotherapy 04/05/2021: Cycle 4 Day 1 of VRd chemotherapy 04/26/2021: Cycle 5 Day 1 of VRd chemotherapy 05/17/2021: Cycle 6 Day 1 of VRd chemotherapy 06/07/2021:  Cycle 7 Day 1 of VRd chemotherapy 06/28/2021: Cycle 8 Day 1 of VRd chemotherapy 07/19/2021: start maintenance revlimid 10mg  PO daily.  03/26/2022: Cycle 1 Day 1 of Dara/Pom/Dex 04/24/2023: Cycle 2 Day 1 of Dara/Pom/Dex 05/22/2023: Cycle 3 Day 1 of Dara/Pom/Dex 06/19/2023: Cycle 4 Day 1 of Dara/Pom/Dex 07/17/2023: Cycle 5 Day 1 of Dara/Pom/Dex  Interval History:  [name] 77 y.o. female with medical history significant for lambda light chain multiple myeloma who presents for a follow up visit. She was last seen on 07/03/2023. In the interim, she has continued Dara/Pom/Dex. She is due for Cycle 5, Day 15 today.   On exam today Mrs. Raimondi reports she feels like her blood sugars be a little low this morning.  She only ate 2 eggs and 2 pieces of toast.  She has been eating applesauce recently and has been enjoying the apples that are currently in season.  She reports that she  recently started on amlodipine 5 mg p.o. daily and notes that it is a real "chill pill".  She continues taking her 2 mg of Pomalyst and is currently on day 8.  She notes that she recently got a multiple myeloma coach who was on Kyprolis therapy and she discussed the Kyprolis treatment with her.  She notes she is having a little more bone pain in her back and reports that this may be due to working more in the yard but is concerned that with the rising free light chains this may be worsening myeloma.  She reports that she is tolerating the Pomalyst well with no major side effects.  She is having some occasional fatigue about the week after there.  Overall she feels well and is willing and able to proceed with treatment at this time..  She denies fevers, chills, night sweats, shortness of breath, chest pain or cough. She has no other complaints. A full 10 point ROS is listed below.   MEDICAL HISTORY:  Past Medical History:  Diagnosis Date   Asthma    Cataract    GERD (gastroesophageal reflux disease)    Hyperlipidemia    Hypertension    Thyroid disease     SURGICAL HISTORY: Past Surgical History:  Procedure Laterality Date   Cataract Surgery     CORONARY ANGIOPLASTY WITH STENT PLACEMENT     EYE SURGERY     ORTHOPEDIC SURGERY  2012   TONSILLECTOMY  1951    SOCIAL HISTORY: Social History   Socioeconomic History   Marital status: Married    Spouse name: Not on file  Number of children: 0   Years of education: Not on file   Highest education level: Master's degree (e.g., MA, MS, MEng, MEd, MSW, MBA)  Occupational History   Occupation: unemployed  Tobacco Use   Smoking status: Never   Smokeless tobacco: Never  Vaping Use   Vaping status: Never Used  Substance and Sexual Activity   Alcohol use: Yes    Alcohol/week: 1.0 standard drink of alcohol    Types: 1 Glasses of wine per week    Comment: occassionally   Drug use: No   Sexual activity: Not Currently  Other Topics Concern    Not on file  Social History Narrative   Married. Education: college. Pt does exercise-   Social Determinants of Health   Financial Resource Strain: Low Risk  (04/24/2023)   Overall Financial Resource Strain (CARDIA)    Difficulty of Paying Living Expenses: Not hard at all  Food Insecurity: No Food Insecurity (04/24/2023)   Hunger Vital Sign    Worried About Running Out of Food in the Last Year: Never true    Ran Out of Food in the Last Year: Never true  Transportation Needs: No Transportation Needs (04/24/2023)   PRAPARE - Administrator, Civil Service (Medical): No    Lack of Transportation (Non-Medical): No  Physical Activity: Insufficiently Active (04/24/2023)   Exercise Vital Sign    Days of Exercise per Week: 7 days    Minutes of Exercise per Session: 20 min  Stress: No Stress Concern Present (04/24/2023)   [employer] of Occupational Health - Occupational Stress Questionnaire    Feeling of Stress : Only a little  Social Connections: Moderately Integrated (04/24/2023)   Social Connection and Isolation Panel [NHANES]    Frequency of Communication with Friends and Family: Twice a week    Frequency of Social Gatherings with Friends and Family: Once a week    Attends Religious Services: Never    Database administrator or Organizations: Yes    Attends Engineer, structural: More than 4 times per year    Marital Status: Married  Catering manager Violence: Not At Risk (04/08/2023)   Humiliation, Afraid, Rape, and Kick questionnaire    Fear of Current or Ex-Partner: No    Emotionally Abused: No    Physically Abused: No    Sexually Abused: No    FAMILY HISTORY: Family History  Problem Relation Age of Onset   Cancer Father    Heart disease Brother    Dementia Brother    Leukemia Brother    Breast cancer Neg Hx    Colon cancer Neg Hx    Esophageal cancer Neg Hx    Pancreatic cancer Neg Hx    Rectal cancer Neg Hx    Stomach cancer Neg Hx      ALLERGIES:  is allergic to poison ivy extract, plavix [clopidogrel bisulfate], and other.  MEDICATIONS:  Current Outpatient Medications  Medication Sig Dispense Refill   acyclovir (ZOVIRAX) 400 MG tablet Take 1 tablet (400 mg total) by mouth 2 (two) times daily. 60 tablet 3   amLODipine (NORVASC) 5 MG tablet Take 1 tablet (5 mg total) by mouth daily. 30 tablet 2   aspirin 81 MG tablet Take 81 mg by mouth daily.      atorvastatin (LIPITOR) 80 MG tablet TAKE 1 TABLET(80 MG) BY MOUTH DAILY AT 6 PM 90 tablet 1   benazepril (LOTENSIN) 10 MG tablet TAKE 1 TABLET(10 MG) BY MOUTH DAILY 90 tablet  1   Blood Glucose Monitoring Suppl DEVI 1 each by Does not apply route as needed (symptoms of low blood sugar.). May substitute to any manufacturer covered by patient's insurance. 1 each 0   calcium carbonate (TUMS - DOSED IN MG ELEMENTAL CALCIUM) 500 MG chewable tablet Chew 1 tablet by mouth daily. Daily PRN     cetirizine (ZYRTEC) 10 MG chewable tablet Chew 10 mg by mouth daily.     cholecalciferol (VITAMIN D3) 25 MCG (1000 UNIT) tablet Take 2,000 Units by mouth daily.     gabapentin (NEURONTIN) 100 MG capsule TAKE 1 CAPSULE(100 MG) BY MOUTH TWICE DAILY AS NEEDED 180 capsule 1   levothyroxine (SYNTHROID) 25 MCG tablet TAKE 1 TABLET(25 MCG) BY MOUTH DAILY BEFORE BREAKFAST 90 tablet 3   Magnesium 100 MG TABS Take 100 mg by mouth daily.     nitroGLYCERIN (NITROSTAT) 0.4 MG SL tablet Place 1 tablet (0.4 mg total) under the tongue every 5 (five) minutes as needed for chest pain. 25 tablet 1   polyethylene glycol (MIRALAX / GLYCOLAX) 17 g packet Take 17 g by mouth daily as needed.     pomalidomide (POMALYST) 2 MG capsule Take 1 capsule (2 mg total) by mouth daily. [employer] Auth # [id number]    Date Obtained 07/15/23 Take 1 capsule daily for 21 days and none for 7 days 21 capsule 0   No current facility-administered medications for this visit.   Facility-Administered Medications Ordered in Other Visits   Medication Dose Route Frequency Provider Last Rate Last Admin   cetirizine (ZYRTEC) tablet 10 mg  10 mg Oral Daily Ulysees Barns IV, MD   10 mg at 07/31/23 1118    REVIEW OF SYSTEMS:   Constitutional: ( - ) fevers, ( - )  chills , ( - ) night sweats Eyes: ( - ) blurriness of vision, ( - ) double vision, ( - ) watery eyes Ears, nose, mouth, throat, and face: ( - ) mucositis, ( - ) sore throat Respiratory: ( - ) cough, ( - ) dyspnea, ( - ) wheezes Cardiovascular: ( - ) palpitation, ( - ) chest discomfort, ( - ) lower extremity swelling Gastrointestinal:  ( - ) nausea, ( - ) heartburn, ( - ) change in bowel habits Skin: ( - ) abnormal skin rashes Lymphatics: ( - ) new lymphadenopathy, ( - ) easy bruising Neurological: ( +) numbness, ( - ) tingling, ( - ) new weaknesses Behavioral/Psych: ( - ) mood change, ( - ) new changes  All other systems were reviewed with the patient and are negative.  PHYSICAL EXAMINATION: ECOG PERFORMANCE STATUS: 1 - Symptomatic but completely ambulatory  Vitals:   07/31/23 1044  BP: 115/76  Pulse: 60  Resp: 13  Temp: 98.3 F (36.8 C)  SpO2: 100%     Filed Weights   07/31/23 1044  Weight: 138 lb 12.8 oz (63 kg)   GENERAL: well appearing elderly Caucasian female in NAD  SKIN: skin color, texture, turgor are normal, no rashes or significant lesions EYES: conjunctiva are pink and non-injected, sclera clear LUNGS: clear to auscultation and percussion with normal breathing effort HEART: regular rate & rhythm and no murmurs and trace lower extremity edema Musculoskeletal: no cyanosis of digits and no clubbing  PSYCH: alert & oriented x 3, fluent speech NEURO: no focal motor/sensory deficits  LABORATORY DATA:  I have reviewed the data as listed    Latest Ref Rng & Units 07/31/2023    9:48 AM 07/17/2023  7:44 AM 07/03/2023   10:05 AM  CBC  WBC 4.0 - 10.5 K/uL 6.6  4.1  5.7   Hemoglobin 12.0 - 15.0 g/dL 84.1  32.4  40.1   Hematocrit 36.0 - 46.0 % 37.2   36.4  36.6   Platelets 150 - 400 K/uL 259  186  267        Latest Ref Rng & Units 07/31/2023    9:48 AM 07/17/2023    7:44 AM 07/03/2023   10:05 AM  CMP  Glucose 70 - 99 mg/dL 90  80  83   BUN 8 - 23 mg/dL 15  14  14    Creatinine 0.44 - 1.00 mg/dL 0.27  2.53  6.64   Sodium 135 - 145 mmol/L 138  138  138   Potassium 3.5 - 5.1 mmol/L 4.0  3.9  4.2   Chloride 98 - 111 mmol/L 106  108  105   CO2 22 - 32 mmol/L 28  24  28    Calcium 8.9 - 10.3 mg/dL 9.6  9.2  9.8   Total Protein 6.5 - 8.1 g/dL 6.5  6.3  6.4   Total Bilirubin 0.3 - 1.2 mg/dL 0.6  0.7  0.6   Alkaline Phos 38 - 126 U/L 61  53  50   AST 15 - 41 U/L 17  19  20    ALT 0 - 44 U/L 16  19  21      Lab Results  Component Value Date   MPROTEIN 0.2 (H) 07/17/2023   MPROTEIN 0.3 (H) 06/19/2023   MPROTEIN 0.2 (H) 06/05/2023   Lab Results  Component Value Date   KPAFRELGTCHN 8.5 07/17/2023   KPAFRELGTCHN 10.8 06/19/2023   KPAFRELGTCHN 9.7 06/05/2023   LAMBDASER 132.4 (H) 07/17/2023   LAMBDASER 118.3 (H) 06/19/2023   LAMBDASER 87.0 (H) 06/05/2023   KAPLAMBRATIO 0.06 (L) 07/17/2023   KAPLAMBRATIO 0.09 (L) 06/19/2023   KAPLAMBRATIO 0.11 (L) 06/05/2023    RADIOGRAPHIC STUDIES: No results found.  ASSESSMENT & PLAN Sanisha Clanton 77 y.o. female with medical history significant for lambda light chain multiple myeloma who presents for a follow up visit. Ms. Jenson is not a candidate for bone marrow transplant based on her advanced age.    # Lambda Light Chain Multiple Myeloma--VGPR on maintenance --diagnosis confirmed with bone marrow biopsy and urine protein analysis.   --patient has good functional status and would be an excellent candidate for VRd chemotherapy. I do not believe she would be a bone marrow transplant candidate based on her age.  --Received VRD chemotherapy from 02/01/2021-07/12/2021 then started maintenance Revlimid on 07/19/2021.  --Due to rising lambda light chain, recommended to switch to  Daratumumab/Pomalyst/Dexamethasone starting today, 03/27/2023. --Started Cycle 1, Day 1 of Dara/Pom/Dex on 03/27/2023 Plan: --Due to start Cycle 5, Day 15 of Dara/Pom/Dex today --Labs today show white blood cell count 6.6, hemoglobin 12.5, MCV 92.8, and platelets of 259.  Creatinine and LFTs are within normal limits. -- Patient is currently on Pomalyst 2 mg p.o.  She did become neutropenic on higher doses.  --Return to clinic weekly for labs/treatment and follow up visit in 2 weeks  #Hypoglycemia: --Glucose was 90, non-fasting --Patient is asymptomatic today. Encouraged to eat right before treatment and snacks every 2-3 hours.  --Patient will check her glucose levels on non-treatment days and follow up with PCP if non-fasting glucose is consistently 70 or below.  #Back Pain/Left Hip Pain  -- Currently well controlled on gabapentin and Tylenol --CT thoracic and  lumbar spine showed no lytic lesions or sequelae of multiple myeloma --MRI thoracic and lumbar spine from 07/07/2022 for baseline imaging. Findings showed multiple bone lesions involving posterior T9 and T8, lumbar spine and upper sacrum consistent with multiple myeloma. No extension into the canal. No pathologic fracture.  --continue follow up with Dr .Donalee Citrin, neurosugery --Continue to monitor  #Supportive Care --chemotherapy education complete  --zofran 8mg  q8H PRN and compazine 10mg  PO q6H for nausea -- acyclovir 400mg  PO BID for VCZ prophylaxis --zometa therapy last received on 02/24/2023. Next due in July 2024. -- no prescription pain medication required at this time.   Orders Placed This Encounter  Procedures   DG Bone Survey Met    Standing Status:   Future    Standing Expiration Date:   07/30/2024    Order Specific Question:   Reason for Exam (SYMPTOM  OR DIAGNOSIS REQUIRED)    Answer:   Multiple myeloma progression, assess for new lytic lesions.    Order Specific Question:   Preferred imaging location?    Answer:   [facility]   All questions were answered. The patient knows to call the clinic with any problems, questions or concerns.  I have spent a total of 30 minutes minutes of face-to-face and non-face-to-face time, preparing to see the patient, performing a medically appropriate examination, counseling and educating the patient, documenting clinical information in the electronic health record.   Ulysees Barns, MD Department of Hematology/Oncology [facility] Phone: [phone] Pager: [phone] Email: [email or web address]  07/31/2023 5:13 PM

## 2023-08-01 ENCOUNTER — Encounter: Payer: Self-pay | Admitting: Hematology and Oncology

## 2023-08-01 ENCOUNTER — Other Ambulatory Visit: Payer: Self-pay | Admitting: Physician Assistant

## 2023-08-05 ENCOUNTER — Ambulatory Visit (HOSPITAL_COMMUNITY)
Admission: RE | Admit: 2023-08-05 | Discharge: 2023-08-05 | Disposition: A | Discharge status: Home / Self Care | Payer: Medicare Other | Source: Ambulatory Visit | Attending: Hematology and Oncology | Admitting: Hematology and Oncology

## 2023-08-05 DIAGNOSIS — C9000 Multiple myeloma not having achieved remission: Secondary | ICD-10-CM | POA: Insufficient documentation

## 2023-08-12 ENCOUNTER — Other Ambulatory Visit: Payer: Self-pay | Admitting: *Deleted

## 2023-08-12 MED ORDER — POMALIDOMIDE 2 MG PO CAPS: 2.0000 mg | ORAL_CAPSULE | Freq: Every day | ORAL | 0 refills | Status: DC

## 2023-08-13 ENCOUNTER — Encounter: Payer: Self-pay | Admitting: Hematology and Oncology

## 2023-08-14 ENCOUNTER — Inpatient Hospital Stay: Payer: Medicare Other

## 2023-08-14 VITALS — BP 125/60 | HR 61 | Temp 98.5°F | Resp 13 | Wt 139.8 lb

## 2023-08-14 DIAGNOSIS — C9000 Multiple myeloma not having achieved remission: Secondary | ICD-10-CM

## 2023-08-14 DIAGNOSIS — Z7961 Long term (current) use of immunomodulator: Secondary | ICD-10-CM | POA: Diagnosis not present

## 2023-08-14 DIAGNOSIS — Z7982 Long term (current) use of aspirin: Secondary | ICD-10-CM | POA: Diagnosis not present

## 2023-08-14 DIAGNOSIS — Z79624 Long term (current) use of inhibitors of nucleotide synthesis: Secondary | ICD-10-CM | POA: Diagnosis not present

## 2023-08-14 DIAGNOSIS — Z5112 Encounter for antineoplastic immunotherapy: Secondary | ICD-10-CM | POA: Diagnosis not present

## 2023-08-14 DIAGNOSIS — Z79899 Other long term (current) drug therapy: Secondary | ICD-10-CM | POA: Diagnosis not present

## 2023-08-14 LAB — CBC WITH DIFFERENTIAL (CANCER CENTER ONLY)
Abs Immature Granulocytes: 0.02 10*3/uL (ref 0.00–0.07)
Basophils Absolute: 0.1 10*3/uL (ref 0.0–0.1)
Basophils Relative: 2 %
Eosinophils Absolute: 0.6 10*3/uL — ABNORMAL HIGH (ref 0.0–0.5)
Eosinophils Relative: 13 %
HCT: 36.1 % (ref 36.0–46.0)
Hemoglobin: 11.8 g/dL — ABNORMAL LOW (ref 12.0–15.0)
Immature Granulocytes: 1 %
Lymphocytes Relative: 27 %
Lymphs Abs: 1.2 10*3/uL (ref 0.7–4.0)
MCH: 30.6 pg (ref 26.0–34.0)
MCHC: 32.7 g/dL (ref 30.0–36.0)
MCV: 93.5 fL (ref 80.0–100.0)
Monocytes Absolute: 0.9 10*3/uL (ref 0.1–1.0)
Monocytes Relative: 21 %
Neutro Abs: 1.6 10*3/uL — ABNORMAL LOW (ref 1.7–7.7)
Neutrophils Relative %: 36 %
Platelet Count: 199 10*3/uL (ref 150–400)
RBC: 3.86 MIL/uL — ABNORMAL LOW (ref 3.87–5.11)
RDW: 13.8 % (ref 11.5–15.5)
WBC Count: 4.4 10*3/uL (ref 4.0–10.5)
nRBC: 0 % (ref 0.0–0.2)

## 2023-08-14 LAB — CMP (CANCER CENTER ONLY)
ALT: 16 U/L (ref 0–44)
AST: 19 U/L (ref 15–41)
Albumin: 4.4 g/dL (ref 3.5–5.0)
Alkaline Phosphatase: 59 U/L (ref 38–126)
Anion gap: 5 (ref 5–15)
BUN: 13 mg/dL (ref 8–23)
CO2: 27 mmol/L (ref 22–32)
Calcium: 9.6 mg/dL (ref 8.9–10.3)
Chloride: 104 mmol/L (ref 98–111)
Creatinine: 0.77 mg/dL (ref 0.44–1.00)
GFR, Estimated: >60 mL/min (ref >60)
Glucose, Bld: 55 mg/dL — ABNORMAL LOW (ref 70–99)
Potassium: 3.6 mmol/L (ref 3.5–5.1)
Sodium: 136 mmol/L (ref 135–145)
Total Bilirubin: 0.8 mg/dL (ref 0.3–1.2)
Total Protein: 6.5 g/dL (ref 6.5–8.1)

## 2023-08-14 LAB — LACTATE DEHYDROGENASE: LDH: 139 U/L (ref 98–192)

## 2023-08-14 MED ORDER — CETIRIZINE HCL 10 MG PO TABS
10.0000 mg | ORAL_TABLET | Freq: Once | ORAL | Status: AC
  Administered 2023-08-14: 10 mg via ORAL
  Filled 2023-08-14: qty 1

## 2023-08-14 MED ORDER — DARATUMUMAB-HYALURONIDASE-FIHJ 1800-30000 MG-UT/15ML ~~LOC~~ SOLN
1800.0000 mg | Freq: Once | SUBCUTANEOUS | Status: AC
  Administered 2023-08-14: 1800 mg via SUBCUTANEOUS
  Filled 2023-08-14: qty 15

## 2023-08-14 MED ORDER — ACETAMINOPHEN 325 MG PO TABS
650.0000 mg | ORAL_TABLET | Freq: Once | ORAL | Status: AC
  Administered 2023-08-14: 650 mg via ORAL
  Filled 2023-08-14: qty 2

## 2023-08-14 MED ORDER — DEXAMETHASONE 4 MG PO TABS
20.0000 mg | ORAL_TABLET | Freq: Once | ORAL | Status: AC
  Administered 2023-08-14: 20 mg via ORAL
  Filled 2023-08-14: qty 5

## 2023-08-14 NOTE — Patient Instructions (Signed)
[facility]  Discharge Instructions: Thank you for choosing [facility] to provide your oncology and hematology care.   If you have a lab appointment with the Cancer Center, please go directly to the Cancer Center and check in at the registration area.   Wear comfortable clothing and clothing appropriate for easy access to any Portacath or PICC line.   We strive to give you quality time with your provider. You may need to reschedule your appointment if you arrive late (15 or more minutes).  Arriving late affects you and other patients whose appointments are after yours.  Also, if you miss three or more appointments without notifying the office, you may be dismissed from the clinic at the provider's discretion.      For prescription refill requests, have your pharmacy contact our office and allow 72 hours for refills to be completed.    Today you received the following chemotherapy and/or immunotherapy agents Darzalex Faspro      To help prevent nausea and vomiting after your treatment, we encourage you to take your nausea medication as directed.  BELOW ARE SYMPTOMS THAT SHOULD BE REPORTED IMMEDIATELY: *FEVER GREATER THAN 100.4 F (38 C) OR HIGHER *CHILLS OR SWEATING *NAUSEA AND VOMITING THAT IS NOT CONTROLLED WITH YOUR NAUSEA MEDICATION *UNUSUAL SHORTNESS OF BREATH *UNUSUAL BRUISING OR BLEEDING *URINARY PROBLEMS (pain or burning when urinating, or frequent urination) *BOWEL PROBLEMS (unusual diarrhea, constipation, pain near the anus) TENDERNESS IN MOUTH AND THROAT WITH OR WITHOUT PRESENCE OF ULCERS (sore throat, sores in mouth, or a toothache) UNUSUAL RASH, SWELLING OR PAIN  UNUSUAL VAGINAL DISCHARGE OR ITCHING   Items with * indicate a potential emergency and should be followed up as soon as possible or go to the Emergency Department if any problems should occur.  Please show the CHEMOTHERAPY ALERT CARD or IMMUNOTHERAPY ALERT CARD at  check-in to the Emergency Department and triage nurse.  Should you have questions after your visit or need to cancel or reschedule your appointment, please contact [facility]  Dept: [phone]  and follow the prompts.  Office hours are 8:00 a.m. to 4:30 p.m. Monday - Friday. Please note that voicemails left after 4:00 p.m. may not be returned until the following business day.  We are closed weekends and major holidays. You have access to a nurse at all times for urgent questions. Please call the main number to the clinic Dept: [phone] and follow the prompts.   For any non-urgent questions, you may also contact your provider using MyChart. We now offer e-Visits for anyone 18 and older to request care online for non-urgent symptoms. For details visit [email or web address].   Also download the MyChart app! Go to the app store, search "MyChart", open the app, select [place], and log in with your MyChart username and password.  

## 2023-08-16 ENCOUNTER — Other Ambulatory Visit: Payer: Self-pay | Admitting: Hematology and Oncology

## 2023-08-16 DIAGNOSIS — C9000 Multiple myeloma not having achieved remission: Secondary | ICD-10-CM

## 2023-08-17 LAB — KAPPA/LAMBDA LIGHT CHAINS
Kappa free light chain: 11.7 mg/L (ref 3.3–19.4)
Kappa, lambda light chain ratio: 0.06 — ABNORMAL LOW (ref 0.26–1.65)
Lambda free light chains: 205.8 mg/L — ABNORMAL HIGH (ref 5.7–26.3)

## 2023-08-18 ENCOUNTER — Other Ambulatory Visit: Payer: Self-pay | Admitting: Hematology and Oncology

## 2023-08-18 ENCOUNTER — Ambulatory Visit (HOSPITAL_COMMUNITY)
Admission: RE | Admit: 2023-08-18 | Discharge: 2023-08-18 | Disposition: A | Discharge status: Home / Self Care | Payer: Medicare Other | Source: Ambulatory Visit | Attending: Hematology and Oncology | Admitting: Hematology and Oncology

## 2023-08-18 DIAGNOSIS — C9000 Multiple myeloma not having achieved remission: Secondary | ICD-10-CM | POA: Insufficient documentation

## 2023-08-18 DIAGNOSIS — R5383 Other fatigue: Secondary | ICD-10-CM | POA: Diagnosis not present

## 2023-08-18 MED ORDER — GADOBUTROL 1 MMOL/ML IV SOLN
6.0000 mL | Freq: Once | INTRAVENOUS | Status: AC | PRN
  Administered 2023-08-18: 6 mL via INTRAVENOUS

## 2023-08-20 LAB — MULTIPLE MYELOMA PANEL, SERUM
Albumin SerPl Elph-Mcnc: 3.6 g/dL (ref 2.9–4.4)
Albumin/Glob SerPl: 1.8 — ABNORMAL HIGH (ref 0.7–1.7)
Alpha 1: 0.2 g/dL (ref 0.0–0.4)
Alpha2 Glob SerPl Elph-Mcnc: 0.7 g/dL (ref 0.4–1.0)
B-Globulin SerPl Elph-Mcnc: 0.9 g/dL (ref 0.7–1.3)
Gamma Glob SerPl Elph-Mcnc: 0.4 g/dL (ref 0.4–1.8)
Globulin, Total: 2.1 g/dL — ABNORMAL LOW (ref 2.2–3.9)
IgA: 41 mg/dL — ABNORMAL LOW (ref 64–422)
IgG (Immunoglobin G), Serum: 479 mg/dL — ABNORMAL LOW (ref 586–1602)
IgM (Immunoglobulin M), Srm: 10 mg/dL — ABNORMAL LOW (ref 26–217)
M Protein SerPl Elph-Mcnc: 0.2 g/dL — ABNORMAL HIGH
Total Protein ELP: 5.7 g/dL — ABNORMAL LOW (ref 6.0–8.5)

## 2023-08-28 ENCOUNTER — Inpatient Hospital Stay (HOSPITAL_BASED_OUTPATIENT_CLINIC_OR_DEPARTMENT_OTHER): Payer: Medicare Other | Admitting: Hematology and Oncology

## 2023-08-28 ENCOUNTER — Inpatient Hospital Stay: Payer: Medicare Other | Attending: Physician Assistant

## 2023-08-28 ENCOUNTER — Inpatient Hospital Stay: Payer: Medicare Other

## 2023-08-28 VITALS — BP 116/66 | HR 66 | Temp 98.0°F | Resp 17 | Ht 66.0 in | Wt 137.8 lb

## 2023-08-28 DIAGNOSIS — C9000 Multiple myeloma not having achieved remission: Secondary | ICD-10-CM | POA: Diagnosis present

## 2023-08-28 DIAGNOSIS — E162 Hypoglycemia, unspecified: Secondary | ICD-10-CM | POA: Insufficient documentation

## 2023-08-28 DIAGNOSIS — Z7961 Long term (current) use of immunomodulator: Secondary | ICD-10-CM | POA: Insufficient documentation

## 2023-08-28 DIAGNOSIS — Z7982 Long term (current) use of aspirin: Secondary | ICD-10-CM | POA: Diagnosis not present

## 2023-08-28 DIAGNOSIS — Z79899 Other long term (current) drug therapy: Secondary | ICD-10-CM | POA: Diagnosis not present

## 2023-08-28 DIAGNOSIS — Z5112 Encounter for antineoplastic immunotherapy: Secondary | ICD-10-CM | POA: Insufficient documentation

## 2023-08-28 DIAGNOSIS — Z79624 Long term (current) use of inhibitors of nucleotide synthesis: Secondary | ICD-10-CM | POA: Diagnosis not present

## 2023-08-28 LAB — CBC WITH DIFFERENTIAL (CANCER CENTER ONLY)
Abs Immature Granulocytes: 0.02 10*3/uL (ref 0.00–0.07)
Basophils Absolute: 0.1 10*3/uL (ref 0.0–0.1)
Basophils Relative: 2 %
Eosinophils Absolute: 0.6 10*3/uL — ABNORMAL HIGH (ref 0.0–0.5)
Eosinophils Relative: 9 %
HCT: 36.8 % (ref 36.0–46.0)
Hemoglobin: 12 g/dL (ref 12.0–15.0)
Immature Granulocytes: 0 %
Lymphocytes Relative: 12 %
Lymphs Abs: 0.8 10*3/uL (ref 0.7–4.0)
MCH: 30.6 pg (ref 26.0–34.0)
MCHC: 32.6 g/dL (ref 30.0–36.0)
MCV: 93.9 fL (ref 80.0–100.0)
Monocytes Absolute: 0.4 10*3/uL (ref 0.1–1.0)
Monocytes Relative: 6 %
Neutro Abs: 4.6 10*3/uL (ref 1.7–7.7)
Neutrophils Relative %: 71 %
Platelet Count: 239 10*3/uL (ref 150–400)
RBC: 3.92 MIL/uL (ref 3.87–5.11)
RDW: 14 % (ref 11.5–15.5)
WBC Count: 6.4 10*3/uL (ref 4.0–10.5)
nRBC: 0 % (ref 0.0–0.2)

## 2023-08-28 LAB — CMP (CANCER CENTER ONLY)
ALT: 14 U/L (ref 0–44)
AST: 16 U/L (ref 15–41)
Albumin: 4.1 g/dL (ref 3.5–5.0)
Alkaline Phosphatase: 68 U/L (ref 38–126)
Anion gap: 4 — ABNORMAL LOW (ref 5–15)
BUN: 14 mg/dL (ref 8–23)
CO2: 28 mmol/L (ref 22–32)
Calcium: 9.8 mg/dL (ref 8.9–10.3)
Chloride: 105 mmol/L (ref 98–111)
Creatinine: 0.72 mg/dL (ref 0.44–1.00)
GFR, Estimated: >60 mL/min (ref >60)
Glucose, Bld: 89 mg/dL (ref 70–99)
Potassium: 3.8 mmol/L (ref 3.5–5.1)
Sodium: 137 mmol/L (ref 135–145)
Total Bilirubin: 0.6 mg/dL (ref 0.3–1.2)
Total Protein: 6.4 g/dL — ABNORMAL LOW (ref 6.5–8.1)

## 2023-08-28 LAB — LACTATE DEHYDROGENASE: LDH: 143 U/L (ref 98–192)

## 2023-08-28 MED ORDER — CETIRIZINE HCL 10 MG PO TABS
10.0000 mg | ORAL_TABLET | Freq: Every day | ORAL | Status: DC
  Administered 2023-08-28: 10 mg via ORAL
  Filled 2023-08-28: qty 1

## 2023-08-28 MED ORDER — DARATUMUMAB-HYALURONIDASE-FIHJ 1800-30000 MG-UT/15ML ~~LOC~~ SOLN
1800.0000 mg | Freq: Once | SUBCUTANEOUS | Status: AC
  Administered 2023-08-28: 1800 mg via SUBCUTANEOUS
  Filled 2023-08-28: qty 15

## 2023-08-28 MED ORDER — ACETAMINOPHEN 325 MG PO TABS
650.0000 mg | ORAL_TABLET | Freq: Once | ORAL | Status: AC
  Administered 2023-08-28: 650 mg via ORAL
  Filled 2023-08-28: qty 2

## 2023-08-28 MED ORDER — DEXAMETHASONE 4 MG PO TABS
20.0000 mg | ORAL_TABLET | Freq: Once | ORAL | Status: AC
  Administered 2023-08-28: 20 mg via ORAL
  Filled 2023-08-28: qty 5

## 2023-08-28 NOTE — Patient Instructions (Signed)
[facility]  Discharge Instructions: Thank you for choosing [facility] to provide your oncology and hematology care.   If you have a lab appointment with the Cancer Center, please go directly to the Cancer Center and check in at the registration area.   Wear comfortable clothing and clothing appropriate for easy access to any Portacath or PICC line.   We strive to give you quality time with your provider. You may need to reschedule your appointment if you arrive late (15 or more minutes).  Arriving late affects you and other patients whose appointments are after yours.  Also, if you miss three or more appointments without notifying the office, you may be dismissed from the clinic at the provider's discretion.      For prescription refill requests, have your pharmacy contact our office and allow 72 hours for refills to be completed.    Today you received the following chemotherapy and/or immunotherapy agents darzalex faspro      To help prevent nausea and vomiting after your treatment, we encourage you to take your nausea medication as directed.  BELOW ARE SYMPTOMS THAT SHOULD BE REPORTED IMMEDIATELY: *FEVER GREATER THAN 100.4 F (38 C) OR HIGHER *CHILLS OR SWEATING *NAUSEA AND VOMITING THAT IS NOT CONTROLLED WITH YOUR NAUSEA MEDICATION *UNUSUAL SHORTNESS OF BREATH *UNUSUAL BRUISING OR BLEEDING *URINARY PROBLEMS (pain or burning when urinating, or frequent urination) *BOWEL PROBLEMS (unusual diarrhea, constipation, pain near the anus) TENDERNESS IN MOUTH AND THROAT WITH OR WITHOUT PRESENCE OF ULCERS (sore throat, sores in mouth, or a toothache) UNUSUAL RASH, SWELLING OR PAIN  UNUSUAL VAGINAL DISCHARGE OR ITCHING   Items with * indicate a potential emergency and should be followed up as soon as possible or go to the Emergency Department if any problems should occur.  Please show the CHEMOTHERAPY ALERT CARD or IMMUNOTHERAPY ALERT CARD at  check-in to the Emergency Department and triage nurse.  Should you have questions after your visit or need to cancel or reschedule your appointment, please contact [facility]  Dept: [phone]  and follow the prompts.  Office hours are 8:00 a.m. to 4:30 p.m. Monday - Friday. Please note that voicemails left after 4:00 p.m. may not be returned until the following business day.  We are closed weekends and major holidays. You have access to a nurse at all times for urgent questions. Please call the main number to the clinic Dept: [phone] and follow the prompts.   For any non-urgent questions, you may also contact your provider using MyChart. We now offer e-Visits for anyone 18 and older to request care online for non-urgent symptoms. For details visit [email or web address].   Also download the MyChart app! Go to the app store, search "MyChart", open the app, select [place], and log in with your MyChart username and password.  

## 2023-08-28 NOTE — Progress Notes (Signed)
[facility] [phone]   [phone]  PROGRESS NOTE  Patient Care Team: Shade Flood, MD as PCP - General (Family Medicine) Yates Decamp, MD as Consulting Physician (Cardiology) Marzella Schlein., MD (Ophthalmology)  Hematological/Oncological History # Lambda Light Chain Multiple Myeloma 12/26/2020: MRI of pelvis showed lytic lesions on L4, L5, the sacrum, with pathologic fracture of left inferior pubic ramus. Concerning for multiple myeloma vs metastatic disease 12/31/2020: establish care with Dr. Leonides Schanz. UPEP showed marked Bence Jones protein, findings concerning for multiple myleoma 01/21/2021: bone marrow biopsy confirms multiple myeloma with atypical plasma cells representing 28% of all cells in the aspirate  02/01/2021: Cycle 1 Day 1 of VRd chemotherapy 02/22/2021: Cycle 2 Day 1 of VRd chemotherapy 03/15/2021: Cycle 3 Day 1 of VRd chemotherapy 04/05/2021: Cycle 4 Day 1 of VRd chemotherapy 04/26/2021: Cycle 5 Day 1 of VRd chemotherapy 05/17/2021: Cycle 6 Day 1 of VRd chemotherapy 06/07/2021:  Cycle 7 Day 1 of VRd chemotherapy 06/28/2021: Cycle 8 Day 1 of VRd chemotherapy 07/19/2021: start maintenance revlimid 10mg  PO daily.  03/26/2022: Cycle 1 Day 1 of Dara/Pom/Dex 04/24/2023: Cycle 2 Day 1 of Dara/Pom/Dex 05/22/2023: Cycle 3 Day 1 of Dara/Pom/Dex 06/19/2023: Cycle 4 Day 1 of Dara/Pom/Dex 07/17/2023: Cycle 5 Day 1 of Dara/Pom/Dex 08/28/2023: d/c Dara/Pom/Dex due to progression.   Interval History:  Doris Wilkerson 77 y.o. female with medical history significant for lambda light chain multiple myeloma who presents for a follow up visit. She was last seen on 07/03/2023. In the interim, she has continued Dara/Pom/Dex. She is due for Cycle 5, Day 15 today.   On exam today Doris Wilkerson reports the pain in her hip is now diminished but she is concerned about the results of the MRI.  She reports that she was tolerating the Darzalex therapy well and not having any side  effects or symptoms.  Overall she feels well.  She denies fevers, chills, night sweats, shortness of breath, chest pain or cough. She has no other complaints. A full 10 point ROS is listed below.   The bulk of our discussion focused on the apparent progression with the increase in lambda light chains and the need to switch therapies.  We discussed Kyprolis therapy, expected side effects, and steps moving forward.  She voiced her understanding and was willing and able to change therapies.  MEDICAL HISTORY:  Past Medical History:  Diagnosis Date   Asthma    Cataract    GERD (gastroesophageal reflux disease)    Hyperlipidemia    Hypertension    Thyroid disease     SURGICAL HISTORY: Past Surgical History:  Procedure Laterality Date   Cataract Surgery     CORONARY ANGIOPLASTY WITH STENT PLACEMENT     EYE SURGERY     ORTHOPEDIC SURGERY  2012   TONSILLECTOMY  1951    SOCIAL HISTORY: Social History   Socioeconomic History   Marital status: Married    Spouse name: Not on file   Number of children: 0   Years of education: Not on file   Highest education level: Master's degree (e.g., MA, MS, MEng, MEd, MSW, MBA)  Occupational History   Occupation: unemployed  Tobacco Use   Smoking status: Never   Smokeless tobacco: Never  Vaping Use   Vaping status: Never Used  Substance and Sexual Activity   Alcohol use: Yes    Alcohol/week: 1.0 standard drink of alcohol    Types: 1 Glasses of wine per week    Comment:  occassionally   Drug use: No   Sexual activity: Not Currently  Other Topics Concern   Not on file  Social History Narrative   Married. Education: college. Pt does exercise-   Social Determinants of Health   Financial Resource Strain: Low Risk  (04/24/2023)   Overall Financial Resource Strain (CARDIA)    Difficulty of Paying Living Expenses: Not hard at all  Food Insecurity: No Food Insecurity (04/24/2023)   Hunger Vital Sign    Worried About Running Out of Food in the  Last Year: Never true    Ran Out of Food in the Last Year: Never true  Transportation Needs: No Transportation Needs (04/24/2023)   PRAPARE - Administrator, Civil Service (Medical): No    Lack of Transportation (Non-Medical): No  Physical Activity: Insufficiently Active (04/24/2023)   Exercise Vital Sign    Days of Exercise per Week: 7 days    Minutes of Exercise per Session: 20 min  Stress: No Stress Concern Present (04/24/2023)   [employer] of Occupational Health - Occupational Stress Questionnaire    Feeling of Stress : Only a little  Social Connections: Moderately Integrated (04/24/2023)   Social Connection and Isolation Panel [NHANES]    Frequency of Communication with Friends and Family: Twice a week    Frequency of Social Gatherings with Friends and Family: Once a week    Attends Religious Services: Never    Database administrator or Organizations: Yes    Attends Engineer, structural: More than 4 times per year    Marital Status: Married  Catering manager Violence: Not At Risk (04/08/2023)   Humiliation, Afraid, Rape, and Kick questionnaire    Fear of Current or Ex-Partner: No    Emotionally Abused: No    Physically Abused: No    Sexually Abused: No    FAMILY HISTORY: Family History  Problem Relation Age of Onset   Cancer Father    Heart disease Brother    Dementia Brother    Leukemia Brother    Breast cancer Neg Hx    Colon cancer Neg Hx    Esophageal cancer Neg Hx    Pancreatic cancer Neg Hx    Rectal cancer Neg Hx    Stomach cancer Neg Hx     ALLERGIES:  is allergic to poison ivy extract, plavix [clopidogrel bisulfate], and other.  MEDICATIONS:  Current Outpatient Medications  Medication Sig Dispense Refill   acyclovir (ZOVIRAX) 400 MG tablet TAKE 1 TABLET(400 MG) BY MOUTH TWICE DAILY 60 tablet 3   amLODipine (NORVASC) 5 MG tablet Take 1 tablet (5 mg total) by mouth daily. 30 tablet 2   aspirin 81 MG tablet Take 81 mg by mouth daily.       atorvastatin (LIPITOR) 80 MG tablet TAKE 1 TABLET(80 MG) BY MOUTH DAILY AT 6 PM 90 tablet 1   benazepril (LOTENSIN) 10 MG tablet TAKE 1 TABLET(10 MG) BY MOUTH DAILY 90 tablet 1   Blood Glucose Monitoring Suppl DEVI 1 each by Does not apply route as needed (symptoms of low blood sugar.). May substitute to any manufacturer covered by patient's insurance. 1 each 0   calcium carbonate (TUMS - DOSED IN MG ELEMENTAL CALCIUM) 500 MG chewable tablet Chew 1 tablet by mouth daily. Daily PRN     cetirizine (ZYRTEC) 10 MG chewable tablet Chew 10 mg by mouth daily.     cholecalciferol (VITAMIN D3) 25 MCG (1000 UNIT) tablet Take 2,000 Units by mouth daily.  gabapentin (NEURONTIN) 100 MG capsule TAKE 1 CAPSULE(100 MG) BY MOUTH TWICE DAILY AS NEEDED 180 capsule 1   levothyroxine (SYNTHROID) 25 MCG tablet TAKE 1 TABLET(25 MCG) BY MOUTH DAILY BEFORE BREAKFAST 90 tablet 3   Magnesium 100 MG TABS Take 100 mg by mouth daily.     nitroGLYCERIN (NITROSTAT) 0.4 MG SL tablet Place 1 tablet (0.4 mg total) under the tongue every 5 (five) minutes as needed for chest pain. 25 tablet 1   polyethylene glycol (MIRALAX / GLYCOLAX) 17 g packet Take 17 g by mouth daily as needed.     pomalidomide (POMALYST) 2 MG capsule Take 1 capsule (2 mg total) by mouth daily. [employer] Auth #  [id number]  Date Obtained 08/12/23 Take 1 capsule daily for 21 days and none for 7 days 21 capsule 0   No current facility-administered medications for this visit.    REVIEW OF SYSTEMS:   Constitutional: ( - ) fevers, ( - )  chills , ( - ) night sweats Eyes: ( - ) blurriness of vision, ( - ) double vision, ( - ) watery eyes Ears, nose, mouth, throat, and face: ( - ) mucositis, ( - ) sore throat Respiratory: ( - ) cough, ( - ) dyspnea, ( - ) wheezes Cardiovascular: ( - ) palpitation, ( - ) chest discomfort, ( - ) lower extremity swelling Gastrointestinal:  ( - ) nausea, ( - ) heartburn, ( - ) change in bowel habits Skin: ( - ) abnormal skin  rashes Lymphatics: ( - ) new lymphadenopathy, ( - ) easy bruising Neurological: ( +) numbness, ( - ) tingling, ( - ) new weaknesses Behavioral/Psych: ( - ) mood change, ( - ) new changes  All other systems were reviewed with the patient and are negative.  PHYSICAL EXAMINATION: ECOG PERFORMANCE STATUS: 1 - Symptomatic but completely ambulatory  Vitals:   08/28/23 1025  BP: 116/66  Pulse: 66  Resp: 17  Temp: 98 F (36.7 C)  SpO2: 100%     Filed Weights   08/28/23 1025  Weight: 137 lb 12.8 oz (62.5 kg)   GENERAL: well appearing elderly Caucasian female in NAD  SKIN: skin color, texture, turgor are normal, no rashes or significant lesions EYES: conjunctiva are pink and non-injected, sclera clear LUNGS: clear to auscultation and percussion with normal breathing effort HEART: regular rate & rhythm and no murmurs and trace lower extremity edema Musculoskeletal: no cyanosis of digits and no clubbing  PSYCH: alert & oriented x 3, fluent speech NEURO: no focal motor/sensory deficits  LABORATORY DATA:  I have reviewed the data as listed    Latest Ref Rng & Units 08/28/2023    9:58 AM 08/14/2023    7:42 AM 07/31/2023    9:48 AM  CBC  WBC 4.0 - 10.5 K/uL 6.4  4.4  6.6   Hemoglobin 12.0 - 15.0 g/dL 54.0  98.1  19.1   Hematocrit 36.0 - 46.0 % 36.8  36.1  37.2   Platelets 150 - 400 K/uL 239  199  259        Latest Ref Rng & Units 08/28/2023    9:58 AM 08/14/2023    7:42 AM 07/31/2023    9:48 AM  CMP  Glucose 70 - 99 mg/dL 89  55  90   BUN 8 - 23 mg/dL 14  13  15    Creatinine 0.44 - 1.00 mg/dL 4.78  2.95  6.21   Sodium 135 - 145 mmol/L 137  136  138   Potassium 3.5 - 5.1 mmol/L 3.8  3.6  4.0   Chloride 98 - 111 mmol/L 105  104  106   CO2 22 - 32 mmol/L 28  27  28    Calcium 8.9 - 10.3 mg/dL 9.8  9.6  9.6   Total Protein 6.5 - 8.1 g/dL 6.4  6.5  6.5   Total Bilirubin 0.3 - 1.2 mg/dL 0.6  0.8  0.6   Alkaline Phos 38 - 126 U/L 68  59  61   AST 15 - 41 U/L 16  19  17    ALT 0 - 44  U/L 14  16  16      Lab Results  Component Value Date   MPROTEIN 0.2 (H) 08/14/2023   MPROTEIN 0.2 (H) 07/17/2023   MPROTEIN 0.3 (H) 06/19/2023   Lab Results  Component Value Date   KPAFRELGTCHN 11.7 08/14/2023   KPAFRELGTCHN 8.5 07/17/2023   KPAFRELGTCHN 10.8 06/19/2023   LAMBDASER 205.8 (H) 08/14/2023   LAMBDASER 132.4 (H) 07/17/2023   LAMBDASER 118.3 (H) 06/19/2023   KAPLAMBRATIO 0.06 (L) 08/14/2023   KAPLAMBRATIO 0.06 (L) 07/17/2023   KAPLAMBRATIO 0.09 (L) 06/19/2023    RADIOGRAPHIC STUDIES: MR PELVIS W WO CONTRAST  Result Date: 08/25/2023 CLINICAL DATA:  Increasing back pain and fatigue. No known injury. Multiple myeloma. EXAM: MRI PELVIS WITHOUT AND WITH CONTRAST TECHNIQUE: Multiplanar multisequence MR imaging of the pelvis was performed both before and after administration of intravenous contrast. CONTRAST:  6mL GADAVIST GADOBUTROL 1 MMOL/ML IV SOLN COMPARISON:  Metastatic bone survey 08/05/2023. CT lumbar spine 03/27/2022. FINDINGS: Bones/Joint/Cartilage There are multiple lytic enhancing lesions suspicious for progressive myeloma. A large lesion involving the right aspect of the L5 vertebral body measures approximately 3.4 x 3.7 cm on image 5/15 and is associated with a probable mild pathologic fracture with paraspinal enhancement. No significant epidural tumor. There is a 2.6 x 1.8 cm lesion in the left sacral ala on image 10/15. In the right iliac bone, there is 1.9 x 1.0 cm lesion lateral to the sacroiliac joint on image 16/15. A lesion in the left ischium measures up to 1.9 x 2.1 cm on axial image 38/15. This measures up to 4.3 cm on coronal image 14/16 and was seen on the recent bone survey. No abnormality identified in the right pubic rami. No proximal femoral lesions are identified. No significant hip arthropathy or effusion. The sacroiliac joints are intact. Ligaments No significant ligamentous findings. Muscles and Tendons No focal muscular atrophy, edema or abnormal  enhancement. No intramuscular masses are identified. Mild common hamstring tendinosis bilaterally. Soft tissue No periarticular soft tissue masses, fluid collections or acute inflammation identified. No significant intrapelvic abnormalities identified status post hysterectomy. IMPRESSION: 1. Multiple enhancing lytic lesions suspicious for progressive myeloma. The largest lesion involves the right aspect of the L5 vertebral body and is associated with a probable mild pathologic fracture. 2. No significant epidural tumor or periarticular soft tissue masses identified. 3. No significant hip or sacroiliac arthropathy. Electronically Signed   By: Carey Bullocks M.D.   On: 08/25/2023 11:51    ASSESSMENT & PLAN Doris Wilkerson 77 y.o. female with medical history significant for lambda light chain multiple myeloma who presents for a follow up visit. Ms. Flitton is not a candidate for bone marrow transplant based on her advanced age.    # Lambda Light Chain Multiple Myeloma--VGPR on maintenance --diagnosis confirmed with bone marrow biopsy and urine protein analysis.   --patient has good functional status and  would be an excellent candidate for VRd chemotherapy. I do not believe she would be a bone marrow transplant candidate based on her age.  --Received VRD chemotherapy from 02/01/2021-07/12/2021 then started maintenance Revlimid on 07/19/2021.  --Due to rising lambda light chain, recommended to switch to Daratumumab/Pomalyst/Dexamethasone starting today, 03/27/2023. --Started Cycle 1, Day 1 of Dara/Pom/Dex on 03/27/2023 Plan: --Due to start Cycle 6, Day 1 of Dara/Pom/Dex today --Labs today show white blood cell count 6.4, hemoglobin 12.0, MCV 93.9, and platelets of 239.  Creatinine and LFTs are within normal limits. -- Due to progression with increase in the lambda light chains we will transition therapy to Kyprolis 3 weeks on 1 week off. --Return to clinic weekly for labs/treatment and follow up visit in 2  weeks  #Hypoglycemia: --Glucose was 89, non-fasting --Patient is asymptomatic today. Encouraged to eat right before treatment and snacks every 2-3 hours.  --Patient will check her glucose levels on non-treatment days and follow up with PCP if non-fasting glucose is consistently 70 or below.  #Back Pain/Left Hip Pain  -- Currently well controlled on gabapentin and Tylenol --CT thoracic and lumbar spine showed no lytic lesions or sequelae of multiple myeloma --MRI thoracic and lumbar spine from 07/07/2022 for baseline imaging. Findings showed multiple bone lesions involving posterior T9 and T8, lumbar spine and upper sacrum consistent with multiple myeloma. No extension into the canal. No pathologic fracture.  --continue follow up with Dr .Donalee Citrin, neurosugery --Continue to monitor  #Supportive Care --chemotherapy education complete  --zofran 8mg  q8H PRN and compazine 10mg  PO q6H for nausea -- acyclovir 400mg  PO BID for VCZ prophylaxis --2 year of zometa therapy completed in July 2024.  -- no prescription pain medication required at this time.   No orders of the defined types were placed in this encounter.  All questions were answered. The patient knows to call the clinic with any problems, questions or concerns.  I have spent a total of 30 minutes minutes of face-to-face and non-face-to-face time, preparing to see the patient, performing a medically appropriate examination, counseling and educating the patient, documenting clinical information in the electronic health record.   Ulysees Barns, MD Department of Hematology/Oncology [facility] Phone: [phone] Pager: [phone] Email: [email or web address]  09/04/2023 12:02 PM

## 2023-08-29 ENCOUNTER — Encounter: Payer: Self-pay | Admitting: Hematology and Oncology

## 2023-09-04 ENCOUNTER — Other Ambulatory Visit: Payer: Self-pay

## 2023-09-04 ENCOUNTER — Other Ambulatory Visit: Payer: Self-pay | Admitting: Hematology and Oncology

## 2023-09-04 ENCOUNTER — Encounter: Payer: Self-pay | Admitting: Hematology and Oncology

## 2023-09-04 DIAGNOSIS — C9000 Multiple myeloma not having achieved remission: Secondary | ICD-10-CM

## 2023-09-04 MED ORDER — POMALIDOMIDE 2 MG PO CAPS: 2.0000 mg | ORAL_CAPSULE | Freq: Every day | ORAL | 0 refills | Status: DC

## 2023-09-04 NOTE — Telephone Encounter (Signed)
Received message from Tiffany in IR stating next available appt for port 10/23.  Verified with Dr Leonides Schanz OK to proceed with that date & first treatment will be peripheral.

## 2023-09-04 NOTE — Progress Notes (Signed)
DISCONTINUE ON PATHWAY REGIMEN - Multiple Myeloma and Other Plasma Cell Dyscrasias     Cycles 1 and 2: A cycle is every 28 days:     Lenalidomide      Dexamethasone      Daratumumab and hyaluronidase-fihj    Cycles 3 through 6: A cycle is every 28 days:     Lenalidomide      Dexamethasone      Daratumumab and hyaluronidase-fihj    Cycles 7 and beyond: A cycle is every 28 days:     Lenalidomide      Dexamethasone      Daratumumab and hyaluronidase-fihj   **Always confirm dose/schedule in your pharmacy ordering system**  REASON: Disease Progression PRIOR TREATMENT: [id number]: DaraRd - Subcutaneous Daratumumab (Daratumumab/hyaluronidase SUBQ + Lenalidomide 25 mg PO + Dexamethasone 40 mg PO/IV) q28 Days Until Progression or Unacceptable Toxicity TREATMENT RESPONSE: Progressive Disease (PD)  START OFF PATHWAY REGIMEN - Multiple Myeloma and Other Plasma Cell Dyscrasias   [id number] - Weekly (Carfilzomib 20/70 mg/m2 + Dexamethasone 40 mg) q28 Days:   A cycle is every 28 days:     Carfilzomib      Carfilzomib      Carfilzomib      Dexamethasone      Dexamethasone   **Always confirm dose/schedule in your pharmacy ordering system**  Patient Characteristics: Multiple Myeloma, Newly Diagnosed, Transplant Ineligible or Refused, Standard Risk Disease Classification: Multiple Myeloma Therapeutic Status: Newly Diagnosed R2-ISS Staging: II Is Patient Eligible for Transplant<= Transplant Ineligible or Refused Risk Status: Standard Risk Intent of Therapy: Non-Curative / Palliative Intent, Discussed with Patient

## 2023-09-04 NOTE — Progress Notes (Signed)
ON PATHWAY REGIMEN - Multiple Myeloma and Other Plasma Cell Dyscrasias  No Change  Continue With Treatment as Ordered.  Original Decision Date/Time: 03/22/2023 04:47     Cycles 1 and 2: A cycle is every 28 days:     Lenalidomide      Dexamethasone      Daratumumab and hyaluronidase-fihj    Cycles 3 through 6: A cycle is every 28 days:     Lenalidomide      Dexamethasone      Daratumumab and hyaluronidase-fihj    Cycles 7 and beyond: A cycle is every 28 days:     Lenalidomide      Dexamethasone      Daratumumab and hyaluronidase-fihj   **Always confirm dose/schedule in your pharmacy ordering system**  Patient Characteristics: Multiple Myeloma, Newly Diagnosed, Transplant Ineligible or Refused, Standard Risk Disease Classification: Multiple Myeloma Therapeutic Status: Newly Diagnosed R2-ISS Staging: II Is Patient Eligible for Transplant<= Transplant Ineligible or Refused Risk Status: Standard Risk Intent of Therapy: Non-Curative / Palliative Intent, Discussed with Patient

## 2023-09-07 ENCOUNTER — Telehealth: Payer: Self-pay

## 2023-09-07 NOTE — Telephone Encounter (Signed)
This RN called pt due to being informed by scheduling that she had questions regarding her medication regimen. This RN reviewed her pre-medications with her for her Kyprolis infusion. Pt informed that she is to receive only Dexamethasone 40 mg IVPB. Pt stated that she did not want benadryl as a pre-med. This RN informed pt that Benadryl is not currently on her tx plan but if anything changes we can see what the MD can adjust as necessary. This RN also reviewed appt dates/times with her. Pt informed to please call back if she has any additional questions. Pt verbalized understanding.

## 2023-09-08 ENCOUNTER — Other Ambulatory Visit: Payer: Self-pay | Admitting: Radiology

## 2023-09-08 ENCOUNTER — Other Ambulatory Visit: Payer: Self-pay | Admitting: Hematology and Oncology

## 2023-09-08 MED ORDER — DEXAMETHASONE 4 MG PO TABS: 20.0000 mg | ORAL_TABLET | ORAL | 5 refills | Status: DC

## 2023-09-08 MED ORDER — ACYCLOVIR 400 MG PO TABS: 400.0000 mg | ORAL_TABLET | Freq: Two times a day (BID) | ORAL | 2 refills | Status: DC

## 2023-09-08 NOTE — H&P (Signed)
Referring Physician(s): Dorsey,John T IV  Supervising Physician: Roanna Banning  Patient Status:  [facility] OP  Chief Complaint:  "I'm here for a port a cath"  Subjective: Patient familiar to IR service from bone marrow biopsy in 2022.  She is a 77 year old female with past medical history significant for asthma, GERD, hypertension, hyperlipidemia, hypothyroidism and lambda light chain multiple myeloma (not having achieved remission). She is scheduled today for Port-A-Cath placement to assist with treatment.She denies fever,HA,CP,dyspnea, cough, abd pain, N/V or bleeding. She does have occ back pain.    Past Medical History:  Diagnosis Date   Asthma    Cataract    GERD (gastroesophageal reflux disease)    Hyperlipidemia    Hypertension    Thyroid disease    Past Surgical History:  Procedure Laterality Date   Cataract Surgery     CORONARY ANGIOPLASTY WITH STENT PLACEMENT     EYE SURGERY     ORTHOPEDIC SURGERY  2012   TONSILLECTOMY  1951      Allergies: Poison ivy extract, Plavix [clopidogrel bisulfate], and Other  Medications: Prior to Admission medications   Medication Sig Start Date End Date Taking? Authorizing Provider  acyclovir (ZOVIRAX) 400 MG tablet Take 1 tablet (400 mg total) by mouth 2 (two) times daily. 09/08/23   Jaci Standard, MD  amLODipine (NORVASC) 5 MG tablet Take 1 tablet (5 mg total) by mouth daily. 07/13/23 10/11/23  Yates Decamp, MD  aspirin 81 MG tablet Take 81 mg by mouth daily.     Dois Davenport, MD  atorvastatin (LIPITOR) 80 MG tablet TAKE 1 TABLET(80 MG) BY MOUTH DAILY AT 6 PM 07/09/23   Shade Flood, MD  benazepril (LOTENSIN) 10 MG tablet TAKE 1 TABLET(10 MG) BY MOUTH DAILY 07/09/23   Shade Flood, MD  Blood Glucose Monitoring Suppl DEVI 1 each by Does not apply route as needed (symptoms of low blood sugar.). May substitute to any manufacturer covered by patient's insurance. 05/11/23   Shade Flood, MD  calcium carbonate (TUMS -  DOSED IN MG ELEMENTAL CALCIUM) 500 MG chewable tablet Chew 1 tablet by mouth daily. Daily PRN    [provider]  cetirizine (ZYRTEC) 10 MG chewable tablet Chew 10 mg by mouth daily.    [provider]  cholecalciferol (VITAMIN D3) 25 MCG (1000 UNIT) tablet Take 2,000 Units by mouth daily.    [provider]  dexamethasone (DECADRON) 4 MG tablet Take 5 tablets (20 mg total) by mouth once a week. 09/08/23   Jaci Standard, MD  gabapentin (NEURONTIN) 100 MG capsule TAKE 1 CAPSULE(100 MG) BY MOUTH TWICE DAILY AS NEEDED 04/14/23   Shade Flood, MD  levothyroxine (SYNTHROID) 25 MCG tablet TAKE 1 TABLET(25 MCG) BY MOUTH DAILY BEFORE BREAKFAST 07/09/23   Shade Flood, MD  Magnesium 100 MG TABS Take 100 mg by mouth daily.    [provider]  nitroGLYCERIN (NITROSTAT) 0.4 MG SL tablet Place 1 tablet (0.4 mg total) under the tongue every 5 (five) minutes as needed for chest pain. 07/13/23 10/11/23  Yates Decamp, MD  polyethylene glycol (MIRALAX / GLYCOLAX) 17 g packet Take 17 g by mouth daily as needed.    [provider]  pomalidomide (POMALYST) 2 MG capsule Take 1 capsule (2 mg total) by mouth daily. Take 1 capsule (2 mg total) by mouth daily for 21 days and none for 7 days. Then repeat cycle 09/04/23   Ulysees Barns IV,  MD     Vital Signs: Vitals:   09/09/23 0755  BP: 134/64  Resp: 16  Temp: 98.4 F (36.9 C)  SpO2: 100%      Code Status: FULL CODE  Physical Exam: awake/alert; chest- CTA bilat; heart- sl bradycardic but reg rhythm; abd-soft,+BS, NT; no LE edema  Imaging: No results found.  Labs:  CBC: Recent Labs    07/17/23 0744 07/31/23 0948 08/14/23 0742 08/28/23 0958  WBC 4.1 6.6 4.4 6.4  HGB 12.2 12.5 11.8* 12.0  HCT 36.4 37.2 36.1 36.8  PLT 186 259 199 239    COAGS: No results for input(s): "INR", "APTT" in the last 8760 hours.  BMP: Recent Labs    07/17/23 0744 07/31/23 0948 08/14/23 0742 08/28/23 0958  NA  138 138 136 137  K 3.9 4.0 3.6 3.8  CL 108 106 104 105  CO2 24 28 27 28   GLUCOSE 80 90 55* 89  BUN 14 15 13 14   CALCIUM 9.2 9.6 9.6 9.8  CREATININE 0.72 0.73 0.77 0.72  GFRNONAA >60 >60 >60 >60    LIVER FUNCTION TESTS: Recent Labs    07/17/23 0744 07/31/23 0948 08/14/23 0742 08/28/23 0958  BILITOT 0.7 0.6 0.8 0.6  AST 19 17 19 16   ALT 19 16 16 14   ALKPHOS 53 61 59 68  PROT 6.3* 6.5 6.5 6.4*  ALBUMIN 4.2 4.3 4.4 4.1    Assessment and Plan: 77 year old female with past medical history significant for asthma, GERD, hypertension, hyperlipidemia, hypothyroidism and lambda light chain multiple myeloma (not having achieved remission). She is scheduled today for Port-A-Cath placement to assist with treatment.Risks and benefits of image guided port-a-catheter placement was discussed with the patient including, but not limited to bleeding, infection, pneumothorax, or fibrin sheath development and need for additional procedures.  All of the patient's questions were answered, patient is agreeable to proceed. Consent signed and in chart.    Electronically Signed: D. Jeananne Rama, PA-C 09/08/2023, 4:41 PM   I spent a total of 25 minutes at the the patient's bedside AND on the patient's hospital floor or unit, greater than 50% of which was counseling/coordinating care for port a cath

## 2023-09-09 ENCOUNTER — Ambulatory Visit (HOSPITAL_COMMUNITY)
Admission: RE | Admit: 2023-09-09 | Discharge: 2023-09-09 | Disposition: A | Discharge status: Home / Self Care | Payer: Medicare Other | Source: Ambulatory Visit | Attending: Hematology and Oncology | Admitting: Hematology and Oncology

## 2023-09-09 ENCOUNTER — Encounter (HOSPITAL_COMMUNITY): Payer: Self-pay

## 2023-09-09 ENCOUNTER — Other Ambulatory Visit: Payer: Self-pay

## 2023-09-09 DIAGNOSIS — K219 Gastro-esophageal reflux disease without esophagitis: Secondary | ICD-10-CM | POA: Diagnosis not present

## 2023-09-09 DIAGNOSIS — C9000 Multiple myeloma not having achieved remission: Secondary | ICD-10-CM | POA: Diagnosis not present

## 2023-09-09 DIAGNOSIS — I10 Essential (primary) hypertension: Secondary | ICD-10-CM | POA: Insufficient documentation

## 2023-09-09 DIAGNOSIS — E039 Hypothyroidism, unspecified: Secondary | ICD-10-CM | POA: Insufficient documentation

## 2023-09-09 DIAGNOSIS — J45909 Unspecified asthma, uncomplicated: Secondary | ICD-10-CM | POA: Diagnosis not present

## 2023-09-09 DIAGNOSIS — E785 Hyperlipidemia, unspecified: Secondary | ICD-10-CM | POA: Diagnosis not present

## 2023-09-09 HISTORY — PX: IR IMAGING GUIDED PORT INSERTION: IMG5740

## 2023-09-09 MED ORDER — SODIUM CHLORIDE 0.9 % IV SOLN: INTRAVENOUS | Status: DC

## 2023-09-09 MED ORDER — HEPARIN SOD (PORK) LOCK FLUSH 100 UNIT/ML IV SOLN
INTRAVENOUS | Status: AC | PRN
  Administered 2023-09-09: 500 [IU] via INTRAVENOUS

## 2023-09-09 MED ORDER — FENTANYL CITRATE (PF) 100 MCG/2ML IJ SOLN
INTRAMUSCULAR | Status: AC | PRN
Start: 2023-09-09 — End: 2023-09-09
  Administered 2023-09-09: 50 ug via INTRAVENOUS
  Administered 2023-09-09 (×2): 25 ug via INTRAVENOUS

## 2023-09-09 MED ORDER — LIDOCAINE-EPINEPHRINE 1 %-1:100000 IJ SOLN
INTRAMUSCULAR | Status: AC
  Filled 2023-09-09: qty 1

## 2023-09-09 MED ORDER — FENTANYL CITRATE (PF) 100 MCG/2ML IJ SOLN
INTRAMUSCULAR | Status: AC
  Filled 2023-09-09: qty 2

## 2023-09-09 MED ORDER — MIDAZOLAM HCL 2 MG/2ML IJ SOLN
INTRAMUSCULAR | Status: AC | PRN
Start: 2023-09-09 — End: 2023-09-09
  Administered 2023-09-09: 1 mg via INTRAVENOUS
  Administered 2023-09-09 (×2): .5 mg via INTRAVENOUS

## 2023-09-09 MED ORDER — MIDAZOLAM HCL 2 MG/2ML IJ SOLN
INTRAMUSCULAR | Status: AC
  Filled 2023-09-09: qty 2

## 2023-09-09 MED ORDER — LIDOCAINE-EPINEPHRINE 1 %-1:100000 IJ SOLN
20.0000 mL | Freq: Once | INTRAMUSCULAR | Status: AC
  Administered 2023-09-09: 20 mL via INTRADERMAL

## 2023-09-09 MED ORDER — HEPARIN SOD (PORK) LOCK FLUSH 100 UNIT/ML IV SOLN
INTRAVENOUS | Status: AC
  Filled 2023-09-09: qty 5

## 2023-09-09 NOTE — Progress Notes (Signed)
Ice bag sent home to use prn to right upper neck and right upper chest for comfort.

## 2023-09-09 NOTE — Procedures (Signed)
Vascular and Interventional Radiology Procedure Note  Patient: Doris Wilkerson DOB: 24-Aug-1946 Medical Record Number: [id number] Note Date/Time: 09/09/23 10:07 AM   Performing Physician: Roanna Banning, MD Assistant(s): None  Diagnosis:  MM  Procedure: PORT PLACEMENT  Anesthesia: Conscious Sedation Complications: None Estimated Blood Loss: Minimal  Findings:  Successful right-sided port placement, with the tip of the catheter in the proximal right atrium.  Plan: Catheter ready for use.  See detailed procedure note with images in PACS. The patient tolerated the procedure well without incident or complication and was returned to Recovery in stable condition.    Roanna Banning, MD Vascular and Interventional Radiology [facility] Rad[facility]   Pager. [phone] Clinic. [phone]

## 2023-09-09 NOTE — Discharge Instructions (Signed)

## 2023-09-10 ENCOUNTER — Encounter: Payer: Self-pay | Admitting: Hematology and Oncology

## 2023-09-10 NOTE — Progress Notes (Signed)

## 2023-09-11 ENCOUNTER — Inpatient Hospital Stay: Payer: Medicare Other

## 2023-09-11 ENCOUNTER — Inpatient Hospital Stay (HOSPITAL_BASED_OUTPATIENT_CLINIC_OR_DEPARTMENT_OTHER): Payer: Medicare Other | Admitting: Physician Assistant

## 2023-09-11 ENCOUNTER — Encounter: Payer: Self-pay | Admitting: Hematology and Oncology

## 2023-09-11 ENCOUNTER — Other Ambulatory Visit: Payer: Self-pay | Admitting: Hematology and Oncology

## 2023-09-11 VITALS — BP 131/62 | HR 64 | Temp 97.6°F | Resp 16 | Wt 137.7 lb

## 2023-09-11 DIAGNOSIS — Z95828 Presence of other vascular implants and grafts: Secondary | ICD-10-CM | POA: Insufficient documentation

## 2023-09-11 DIAGNOSIS — C9000 Multiple myeloma not having achieved remission: Secondary | ICD-10-CM

## 2023-09-11 DIAGNOSIS — Z7982 Long term (current) use of aspirin: Secondary | ICD-10-CM | POA: Diagnosis not present

## 2023-09-11 DIAGNOSIS — Z79624 Long term (current) use of inhibitors of nucleotide synthesis: Secondary | ICD-10-CM | POA: Diagnosis not present

## 2023-09-11 DIAGNOSIS — Z5112 Encounter for antineoplastic immunotherapy: Secondary | ICD-10-CM | POA: Diagnosis not present

## 2023-09-11 DIAGNOSIS — E162 Hypoglycemia, unspecified: Secondary | ICD-10-CM | POA: Diagnosis not present

## 2023-09-11 DIAGNOSIS — Z7961 Long term (current) use of immunomodulator: Secondary | ICD-10-CM | POA: Diagnosis not present

## 2023-09-11 LAB — CBC WITH DIFFERENTIAL (CANCER CENTER ONLY)
Abs Immature Granulocytes: 0.01 10*3/uL (ref 0.00–0.07)
Basophils Absolute: 0.1 10*3/uL (ref 0.0–0.1)
Basophils Relative: 1 %
Eosinophils Absolute: 1.2 10*3/uL — ABNORMAL HIGH (ref 0.0–0.5)
Eosinophils Relative: 22 %
HCT: 36.4 % (ref 36.0–46.0)
Hemoglobin: 12.1 g/dL (ref 12.0–15.0)
Immature Granulocytes: 0 %
Lymphocytes Relative: 18 %
Lymphs Abs: 0.9 10*3/uL (ref 0.7–4.0)
MCH: 30.3 pg (ref 26.0–34.0)
MCHC: 33.2 g/dL (ref 30.0–36.0)
MCV: 91.2 fL (ref 80.0–100.0)
Monocytes Absolute: 0.8 10*3/uL (ref 0.1–1.0)
Monocytes Relative: 16 %
Neutro Abs: 2.2 10*3/uL (ref 1.7–7.7)
Neutrophils Relative %: 43 %
Platelet Count: 224 10*3/uL (ref 150–400)
RBC: 3.99 MIL/uL (ref 3.87–5.11)
RDW: 13.9 % (ref 11.5–15.5)
WBC Count: 5.2 10*3/uL (ref 4.0–10.5)
nRBC: 0 % (ref 0.0–0.2)

## 2023-09-11 LAB — CMP (CANCER CENTER ONLY)
ALT: 15 U/L (ref 0–44)
AST: 17 U/L (ref 15–41)
Albumin: 4.2 g/dL (ref 3.5–5.0)
Alkaline Phosphatase: 71 U/L (ref 38–126)
Anion gap: 6 (ref 5–15)
BUN: 14 mg/dL (ref 8–23)
CO2: 24 mmol/L (ref 22–32)
Calcium: 9.7 mg/dL (ref 8.9–10.3)
Chloride: 107 mmol/L (ref 98–111)
Creatinine: 0.66 mg/dL (ref 0.44–1.00)
GFR, Estimated: >60 mL/min (ref >60)
Glucose, Bld: 97 mg/dL (ref 70–99)
Potassium: 3.7 mmol/L (ref 3.5–5.1)
Sodium: 137 mmol/L (ref 135–145)
Total Bilirubin: 0.6 mg/dL (ref 0.3–1.2)
Total Protein: 6.6 g/dL (ref 6.5–8.1)

## 2023-09-11 MED ORDER — SODIUM CHLORIDE 0.9 % IV SOLN: INTRAVENOUS | Status: DC

## 2023-09-11 MED ORDER — SODIUM CHLORIDE 0.9 % IV SOLN
40.0000 mg | Freq: Once | INTRAVENOUS | Status: AC
  Administered 2023-09-11: 40 mg via INTRAVENOUS
  Filled 2023-09-11: qty 4

## 2023-09-11 MED ORDER — SODIUM CHLORIDE 0.9% FLUSH
10.0000 mL | INTRAVENOUS | Status: DC | PRN
  Administered 2023-09-11: 10 mL

## 2023-09-11 MED ORDER — LIDOCAINE-PRILOCAINE 2.5-2.5 % EX CREA: 1.0000 | TOPICAL_CREAM | CUTANEOUS | 0 refills | Status: AC | PRN

## 2023-09-11 MED ORDER — SODIUM CHLORIDE 0.9% FLUSH
10.0000 mL | Freq: Once | INTRAVENOUS | Status: AC
  Administered 2023-09-11: 10 mL

## 2023-09-11 MED ORDER — SODIUM CHLORIDE 0.9 % IV SOLN: Freq: Once | INTRAVENOUS | Status: AC

## 2023-09-11 MED ORDER — HEPARIN SOD (PORK) LOCK FLUSH 100 UNIT/ML IV SOLN
500.0000 [IU] | Freq: Once | INTRAVENOUS | Status: AC | PRN
  Administered 2023-09-11: 500 [IU]

## 2023-09-11 MED ORDER — DEXTROSE 5 % IV SOLN
20.0000 mg/m2 | Freq: Once | INTRAVENOUS | Status: AC
  Administered 2023-09-11: 34 mg via INTRAVENOUS
  Filled 2023-09-11: qty 15

## 2023-09-11 MED ORDER — DEXAMETHASONE 4 MG PO TABS: 40.0000 mg | ORAL_TABLET | ORAL | 5 refills | Status: DC

## 2023-09-11 NOTE — Patient Instructions (Signed)
[facility]  Discharge Instructions: Thank you for choosing [facility] to provide your oncology and hematology care.   If you have a lab appointment with the Cancer Center, please go directly to the Cancer Center and check in at the registration area.   Wear comfortable clothing and clothing appropriate for easy access to any Portacath or PICC line.   We strive to give you quality time with your provider. You may need to reschedule your appointment if you arrive late (15 or more minutes).  Arriving late affects you and other patients whose appointments are after yours.  Also, if you miss three or more appointments without notifying the office, you may be dismissed from the clinic at the provider's discretion.      For prescription refill requests, have your pharmacy contact our office and allow 72 hours for refills to be completed.    Today you received the following chemotherapy and/or immunotherapy agents: Kyprolis      To help prevent nausea and vomiting after your treatment, we encourage you to take your nausea medication as directed.  BELOW ARE SYMPTOMS THAT SHOULD BE REPORTED IMMEDIATELY: *FEVER GREATER THAN 100.4 F (38 C) OR HIGHER *CHILLS OR SWEATING *NAUSEA AND VOMITING THAT IS NOT CONTROLLED WITH YOUR NAUSEA MEDICATION *UNUSUAL SHORTNESS OF BREATH *UNUSUAL BRUISING OR BLEEDING *URINARY PROBLEMS (pain or burning when urinating, or frequent urination) *BOWEL PROBLEMS (unusual diarrhea, constipation, pain near the anus) TENDERNESS IN MOUTH AND THROAT WITH OR WITHOUT PRESENCE OF ULCERS (sore throat, sores in mouth, or a toothache) UNUSUAL RASH, SWELLING OR PAIN  UNUSUAL VAGINAL DISCHARGE OR ITCHING   Items with * indicate a potential emergency and should be followed up as soon as possible or go to the Emergency Department if any problems should occur.  Please show the CHEMOTHERAPY ALERT CARD or IMMUNOTHERAPY ALERT CARD at  check-in to the Emergency Department and triage nurse.  Should you have questions after your visit or need to cancel or reschedule your appointment, please contact [facility] [facility]  Dept: [phone]  and follow the prompts.  Office hours are 8:00 a.m. to 4:30 p.m. Monday - Friday. Please note that voicemails left after 4:00 p.m. may not be returned until the following business day.  We are closed weekends and major holidays. You have access to a nurse at all times for urgent questions. Please call the main number to the clinic Dept: [phone] and follow the prompts.   For any non-urgent questions, you may also contact your provider using MyChart. We now offer e-Visits for anyone 22 and older to request care online for non-urgent symptoms. For details visit [email or web address].   Also download the MyChart app! Go to the app store, search "MyChart", open the app, select [place], and log in with your MyChart username and password.  Carfilzomib Injection What is this medication? CARFILZOMIB (kar FILZ oh mib) treats multiple myeloma, a type of bone marrow cancer. It works by blocking a protein that causes cancer cells to grow and multiply. This helps to slow or stop the spread of cancer cells. This medicine may be used for other purposes; ask your health care provider or pharmacist if you have questions. COMMON BRAND NAME(S): KYPROLIS What should I tell my care team before I take this medication? They need to know if you have any of these conditions: Heart disease History of blood clots Irregular heartbeat Kidney disease Liver disease Lung or breathing  disease An unusual or allergic reaction to carfilzomib, or other medications, foods, dyes, or preservatives If you or your partner are pregnant or trying to get pregnant Breastfeeding How should I use this medication? This medication is injected into a vein. It is given by your care team in a hospital  or clinic setting. Talk to your care team about the use of this medication in children. Special care may be needed. Overdosage: If you think you have taken too much of this medicine contact a poison control center or emergency room at once. NOTE: This medicine is only for you. Do not share this medicine with others. What if I miss a dose? Keep appointments for follow-up doses. It is important not to miss your dose. Call your care team if you are unable to keep an appointment. What may interact with this medication? Interactions are not expected. This list may not describe all possible interactions. Give your health care provider a list of all the medicines, herbs, non-prescription drugs, or dietary supplements you use. Also tell them if you smoke, drink alcohol, or use illegal drugs. Some items may interact with your medicine. What should I watch for while using this medication? Your condition will be monitored carefully while you are receiving this medication. You may need blood work while taking this medication. Check with your care team if you have severe diarrhea, nausea, and vomiting, or if you sweat a lot. The loss of too much body fluid may make it dangerous for you to take this medication. This medication may affect your coordination, reaction time, or judgment. Do not drive or operate machinery until you know how this medication affects you. Sit up or stand slowly to reduce the risk of dizzy or fainting spells. Drinking alcohol with this medication can increase the risk of these side effects. Talk to your care team if you may be pregnant. Serious birth defects can occur if you take this medication during pregnancy and for 6 months after the last dose. You will need a negative pregnancy test before starting this medication. Contraception is recommended while taking this medication and for 6 months after the last dose. Your care team can help you find an option that works for you. If your  partner can get pregnant, use a condom during sex while taking this medication and for 3 months after the last dose. Do not breastfeed while taking this medication and for 2 weeks after the last dose. This medication may cause infertility. Talk to your care team if you are concerned about your fertility. What side effects may I notice from receiving this medication? Side effects that you should report to your care team as soon as possible: Allergic reactions--skin rash, itching, hives, swelling of the face, lips, tongue, or throat Bleeding--bloody or black, tar-like stools, vomiting blood or brown material that looks like coffee grounds, red or dark brown urine, small red or purple spots on skin, unusual bruising or bleeding Blood clot--pain, swelling, or warmth in the leg, shortness of breath, chest pain Dizziness, loss of balance or coordination, confusion or trouble speaking Heart attack--pain or tightness in the chest, shoulders, arms, or jaw, nausea, shortness of breath, cold or clammy skin, feeling faint or lightheaded Heart failure--shortness of breath, swelling of the ankles, feet, or hands, sudden weight gain, unusual weakness or fatigue Heart rhythm changes--fast or irregular heartbeat, dizziness, feeling faint or lightheaded, chest pain, trouble breathing Increase in blood pressure Infection--fever, chills, cough, sore throat, wounds that don't heal, pain or  trouble when passing urine, general feeling of discomfort or being unwell Infusion reactions--chest pain, shortness of breath or trouble breathing, feeling faint or lightheaded Kidney injury--decrease in the amount of urine, swelling of the ankles, hands, or feet Liver injury--right upper belly pain, loss of appetite, nausea, light-colored stool, dark yellow or brown urine, yellowing skin or eyes, unusual weakness or fatigue Lung injury--shortness of breath or trouble breathing, cough, spitting up blood, chest pain, fever Pulmonary  hypertension--shortness of breath, chest pain, fast or irregular heartbeat, feeling faint or lightheaded, fatigue, swelling of the ankles or feet Stomach pain, bloody diarrhea, pale skin, unusual weakness or fatigue, decrease in the amount of urine, which may be signs of hemolytic uremic syndrome Sudden and severe headache, confusion, change in vision, seizures, which may be signs of posterior reversible encephalopathy syndrome (PRES) TTP--purple spots on the skin or inside the mouth, pale skin, yellowing skin or eyes, unusual weakness or fatigue, fever, fast or irregular heartbeat, confusion, change in vision, trouble speaking, trouble walking Tumor lysis syndrome (TLS)--nausea, vomiting, diarrhea, decrease in the amount of urine, dark urine, unusual weakness or fatigue, confusion, muscle pain or cramps, fast or irregular heartbeat, joint pain Side effects that usually do not require medical attention (report to your care team if they continue or are bothersome): Diarrhea Fatigue Nausea Trouble sleeping This list may not describe all possible side effects. Call your doctor for medical advice about side effects. You may report side effects to FDA at 1-800-FDA-1088. Where should I keep my medication? This medication is given in a hospital or clinic. It will not be stored at home. NOTE: This sheet is a summary. It may not cover all possible information. If you have questions about this medicine, talk to your doctor, pharmacist, or health care provider.  2024 [employer]vier/Gold Standard (2022-04-10 00:00:00)

## 2023-09-11 NOTE — Progress Notes (Signed)
Zometa therapy completed per Leonides Schanz, MD

## 2023-09-13 ENCOUNTER — Encounter: Payer: Self-pay | Admitting: Hematology and Oncology

## 2023-09-13 NOTE — Progress Notes (Signed)
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 Concerning for multiple myeloma vs metastatic disease 12/31/2020: establish care with Dr. Leonides Schanz. UPEP showed marked Bence Jones protein, findings concerning for multiple myleoma 01/21/2021: bone marrow biopsy confirms multiple myeloma with atypical plasma cells representing 28% of all cells in the aspirate  02/01/2021: Cycle 1 Day 1 of VRd chemotherapy 02/22/2021: Cycle 2 Day 1 of VRd chemotherapy 03/15/2021: Cycle 3 Day 1 of VRd chemotherapy 04/05/2021: Cycle 4 Day 1 of VRd chemotherapy 04/26/2021: Cycle 5 Day 1 of VRd chemotherapy 05/17/2021: Cycle 6 Day 1 of VRd chemotherapy 06/07/2021:  Cycle 7 Day 1 of VRd chemotherapy 06/28/2021: Cycle 8 Day 1 of VRd chemotherapy 07/19/2021: start maintenance revlimid 10mg  PO daily.  03/26/2022: Cycle 1 Day 1 of Dara/Pom/Dex 04/24/2023: Cycle 2 Day 1 of Dara/Pom/Dex 05/22/2023: Cycle 3 Day 1 of Dara/Pom/Dex 06/19/2023: Cycle 4 Day 1 of Dara/Pom/Dex 07/17/2023: Cycle 5 Day 1 of Dara/Pom/Dex 08/28/2023: d/c Dara/Pom/Dex due to progression.  09/11/2023: Cycle 1 Day 1 of Kyprolis/Dex  Interval History:  Doris Wilkerson 77 y.o. female with medical history significant for lambda light chain multiple myeloma who presents for a follow up visit. She was last seen on 10*/02/2023. In the interim, she has discontinued Dara/Pom/Dex and will start Kyprolis/Dex today. She is due for Cycle 1, Day 1 today. She is accompanied by her husband for this visit.   On exam today Doris Wilkerson reports she is overall feeling well but reports persistent  fatigue that has been present for the last several months. Her fatigue does impact her daily activities which requires frequent resting. She denies any appetite or weight changes. She denies nausea, vomiting or bowel habit changes. She denies easy bruising or signs of bleeding. She denies fevers, chills, night sweats, shortness of breath, chest pain or cough. She has no other complaints. A full 10 point ROS is listed below.    MEDICAL HISTORY:  Past Medical History:  Diagnosis Date   Asthma    Cataract    GERD (gastroesophageal reflux disease)    Hyperlipidemia    Hypertension    Thyroid disease     SURGICAL HISTORY: Past Surgical History:  Procedure Laterality Date   Cataract Surgery     CORONARY ANGIOPLASTY WITH STENT PLACEMENT     EYE SURGERY     IR IMAGING GUIDED PORT INSERTION  09/09/2023   ORTHOPEDIC SURGERY  2012   TONSILLECTOMY  1951    SOCIAL HISTORY: Social History   Socioeconomic History   Marital status: Married    Spouse name: Not on file   Number of children: 0   Years of education: Not on file   Highest education level: Master's degree (e.g., MA, MS, MEng, MEd, MSW, MBA)  Occupational History   Occupation: unemployed  Tobacco Use   Smoking status: Never   Smokeless tobacco: Never  Vaping Use   Vaping status: Never Used  Substance and Sexual Activity   Alcohol use: Yes    Alcohol/week: 1.0 standard drink of alcohol    Types: 1 Glasses of wine per week    Comment: occassionally   Drug use: No   Sexual  activity: Not Currently  Other Topics Concern   Not on file  Social History Narrative   Married. Education: college. Pt does exercise-   Social Determinants of Health   Financial Resource Strain: Low Risk  (04/24/2023)   Overall Financial Resource Strain (CARDIA)    Difficulty of Paying Living Expenses: Not hard at all  Food Insecurity: No Food Insecurity (04/24/2023)   Hunger Vital Sign    Worried About Running Out of Food in the Last Year: Never  true    Ran Out of Food in the Last Year: Never true  Transportation Needs: No Transportation Needs (04/24/2023)   PRAPARE - Administrator, Civil Service (Medical): No    Lack of Transportation (Non-Medical): No  Physical Activity: Insufficiently Active (04/24/2023)   Exercise Vital Sign    Days of Exercise per Week: 7 days    Minutes of Exercise per Session: 20 min  Stress: No Stress Concern Present (04/24/2023)   [employer] of Occupational Health - Occupational Stress Questionnaire    Feeling of Stress : Only a little  Social Connections: Moderately Integrated (04/24/2023)   Social Connection and Isolation Panel [NHANES]    Frequency of Communication with Friends and Family: Twice a week    Frequency of Social Gatherings with Friends and Family: Once a week    Attends Religious Services: Never    Database administrator or Organizations: Yes    Attends Engineer, structural: More than 4 times per year    Marital Status: Married  Catering manager Violence: Not At Risk (04/08/2023)   Humiliation, Afraid, Rape, and Kick questionnaire    Fear of Current or Ex-Partner: No    Emotionally Abused: No    Physically Abused: No    Sexually Abused: No    FAMILY HISTORY: Family History  Problem Relation Age of Onset   Cancer Father    Heart disease Brother    Dementia Brother    Leukemia Brother    Breast cancer Neg Hx    Colon cancer Neg Hx    Esophageal cancer Neg Hx    Pancreatic cancer Neg Hx    Rectal cancer Neg Hx    Stomach cancer Neg Hx     ALLERGIES:  is allergic to poison ivy extract, plavix [clopidogrel bisulfate], and other.  MEDICATIONS:  Current Outpatient Medications  Medication Sig Dispense Refill   acyclovir (ZOVIRAX) 400 MG tablet Take 1 tablet (400 mg total) by mouth 2 (two) times daily. 180 tablet 2   amLODipine (NORVASC) 5 MG tablet Take 1 tablet (5 mg total) by mouth daily. 30 tablet 2   aspirin 81 MG tablet Take 81 mg by mouth daily.       atorvastatin (LIPITOR) 80 MG tablet TAKE 1 TABLET(80 MG) BY MOUTH DAILY AT 6 PM 90 tablet 1   benazepril (LOTENSIN) 10 MG tablet TAKE 1 TABLET(10 MG) BY MOUTH DAILY 90 tablet 1   Blood Glucose Monitoring Suppl DEVI 1 each by Does not apply route as needed (symptoms of low blood sugar.). May substitute to any manufacturer covered by patient's insurance. 1 each 0   calcium carbonate (TUMS - DOSED IN MG ELEMENTAL CALCIUM) 500 MG chewable tablet Chew 1 tablet by mouth daily. Daily PRN     cetirizine (ZYRTEC) 10 MG chewable tablet Chew 10 mg by mouth daily.     cholecalciferol (VITAMIN D3) 25 MCG (1000 UNIT) tablet Take 2,000 Units by mouth daily.     gabapentin (NEURONTIN)  100 MG capsule TAKE 1 CAPSULE(100 MG) BY MOUTH TWICE DAILY AS NEEDED 180 capsule 1   levothyroxine (SYNTHROID) 25 MCG tablet TAKE 1 TABLET(25 MCG) BY MOUTH DAILY BEFORE BREAKFAST 90 tablet 3   lidocaine-prilocaine (EMLA) cream Apply 1 Application topically as needed. 30 g 0   Magnesium 100 MG TABS Take 100 mg by mouth daily.     nitroGLYCERIN (NITROSTAT) 0.4 MG SL tablet Place 1 tablet (0.4 mg total) under the tongue every 5 (five) minutes as needed for chest pain. 25 tablet 1   polyethylene glycol (MIRALAX / GLYCOLAX) 17 g packet Take 17 g by mouth daily as needed.     dexamethasone (DECADRON) 4 MG tablet Take 10 tablets (40 mg total) by mouth once a week. 30 tablet 5   pomalidomide (POMALYST) 2 MG capsule Take 1 capsule (2 mg total) by mouth daily. Take 1 capsule (2 mg total) by mouth daily for 21 days and none for 7 days. Then repeat cycle (Patient not taking: Reported on 09/11/2023) 21 capsule 0   No current facility-administered medications for this visit.    REVIEW OF SYSTEMS:   Constitutional: ( - ) fevers, ( - )  chills , ( - ) night sweats Eyes: ( - ) blurriness of vision, ( - ) double vision, ( - ) watery eyes Ears, nose, mouth, throat, and face: ( - ) mucositis, ( - ) sore throat Respiratory: ( - ) cough, ( - )  dyspnea, ( - ) wheezes Cardiovascular: ( - ) palpitation, ( - ) chest discomfort, ( - ) lower extremity swelling Gastrointestinal:  ( - ) nausea, ( - ) heartburn, ( - ) change in bowel habits Skin: ( - ) abnormal skin rashes Lymphatics: ( - ) new lymphadenopathy, ( - ) easy bruising Neurological: ( +) numbness, ( - ) tingling, ( - ) new weaknesses Behavioral/Psych: ( - ) mood change, ( - ) new changes  All other systems were reviewed with the patient and are negative.  PHYSICAL EXAMINATION: ECOG PERFORMANCE STATUS: 1 - Symptomatic but completely ambulatory  Vitals:   09/11/23 1323  BP: 131/62  Pulse: 64  Resp: 16  Temp: 97.6 F (36.4 C)  SpO2: 100%     Filed Weights   09/11/23 1323  Weight: 137 lb 11.2 oz (62.5 kg)   GENERAL: well appearing elderly Caucasian female in NAD  SKIN: skin color, texture, turgor are normal, no rashes or significant lesions EYES: conjunctiva are pink and non-injected, sclera clear LUNGS: clear to auscultation and percussion with normal breathing effort HEART: regular rate & rhythm and no murmurs and trace lower extremity edema Musculoskeletal: no cyanosis of digits and no clubbing  PSYCH: alert & oriented x 3, fluent speech NEURO: no focal motor/sensory deficits  LABORATORY DATA:  I have reviewed the data as listed    Latest Ref Rng & Units 09/11/2023   12:41 PM 08/28/2023    9:58 AM 08/14/2023    7:42 AM  CBC  WBC 4.0 - 10.5 K/uL 5.2  6.4  4.4   Hemoglobin 12.0 - 15.0 g/dL 75.6  43.3  29.5   Hematocrit 36.0 - 46.0 % 36.4  36.8  36.1   Platelets 150 - 400 K/uL 224  239  199        Latest Ref Rng & Units 09/11/2023   12:41 PM 08/28/2023    9:58 AM 08/14/2023    7:42 AM  CMP  Glucose 70 - 99 mg/dL 97  89  55   BUN 8 - 23 mg/dL 14  14  13    Creatinine 0.44 - 1.00 mg/dL 3.01  6.01  0.93   Sodium 135 - 145 mmol/L 137  137  136   Potassium 3.5 - 5.1 mmol/L 3.7  3.8  3.6   Chloride 98 - 111 mmol/L 107  105  104   CO2 22 - 32 mmol/L 24  28   27    Calcium 8.9 - 10.3 mg/dL 9.7  9.8  9.6   Total Protein 6.5 - 8.1 g/dL 6.6  6.4  6.5   Total Bilirubin 0.3 - 1.2 mg/dL 0.6  0.6  0.8   Alkaline Phos 38 - 126 U/L 71  68  59   AST 15 - 41 U/L 17  16  19    ALT 0 - 44 U/L 15  14  16      Lab Results  Component Value Date   MPROTEIN 0.2 (H) 08/14/2023   MPROTEIN 0.2 (H) 07/17/2023   MPROTEIN 0.3 (H) 06/19/2023   Lab Results  Component Value Date   KPAFRELGTCHN 11.7 08/14/2023   KPAFRELGTCHN 8.5 07/17/2023   KPAFRELGTCHN 10.8 06/19/2023   LAMBDASER 205.8 (H) 08/14/2023   LAMBDASER 132.4 (H) 07/17/2023   LAMBDASER 118.3 (H) 06/19/2023   KAPLAMBRATIO 0.06 (L) 08/14/2023   KAPLAMBRATIO 0.06 (L) 07/17/2023   KAPLAMBRATIO 0.09 (L) 06/19/2023    RADIOGRAPHIC STUDIES: IR IMAGING GUIDED PORT INSERTION  Result Date: 09/09/2023 INDICATION: starting IV chemotherapy for multiple myeloma, requesting port EXAM: IMPLANTED PORT A CATH PLACEMENT WITH ULTRASOUND AND FLUOROSCOPIC GUIDANCE MEDICATIONS: None ANESTHESIA/SEDATION: Moderate (conscious) sedation was employed during this procedure. A total of Versed 2 mg and Fentanyl 1 mcg was administered intravenously. Moderate Sedation Time: 22 minutes. The patient's level of consciousness and vital signs were monitored continuously by radiology nursing throughout the procedure under my direct supervision. FLUOROSCOPY TIME:  Fluoroscopic dose; 1 mGy COMPLICATIONS: None immediate. PROCEDURE: The procedure, risks, benefits, and alternatives were explained to the patient. Questions regarding the procedure were encouraged and answered. The patient understands and consents to the procedure. The RIGHT neck and chest were prepped with chlorhexidine in a sterile fashion, and a sterile drape was applied covering the operative field. Maximum barrier sterile technique with sterile gowns and gloves were used for the procedure. A timeout was performed prior to the initiation of the procedure. Local anesthesia was provided  with 1% lidocaine with epinephrine. After creating a small venotomy incision, a micropuncture kit was utilized to access the internal jugular vein under direct, real-time ultrasound guidance. Ultrasound image documentation was performed. The microwire was kinked to measure appropriate catheter length. A subcutaneous port pocket was then created along the upper chest wall utilizing a combination of sharp and blunt dissection. The pocket was irrigated with sterile saline. A single lumen Non-ISP power injectable port was chosen for placement. The 8 Fr catheter was tunneled from the port pocket site to the venotomy incision. The port was placed in the pocket. The external catheter was trimmed to appropriate length. At the venotomy, an 8 Fr peel-away sheath was placed over a guidewire under fluoroscopic guidance. The catheter was then placed through the sheath and the sheath was removed. Final catheter positioning was confirmed and documented with a fluoroscopic spot radiograph. The port was accessed with a Huber needle, aspirated and flushed with heparinized saline. The port pocket incision was closed with interrupted 3-0 Vicryl suture then Dermabond was applied, including at the venotomy incision. Dressings were placed. The patient  tolerated the procedure well without immediate post procedural complication. IMPRESSION: Successful placement of a RIGHT internal jugular approach power injectable Port-A-Cath. The tip of the catheter is positioned at the superior cavo-atrial junction. The catheter is ready for immediate use. Roanna Banning, MD [facility] Electronically Signed   By: Roanna Banning M.D.   On: 09/09/2023 14:00   MR PELVIS W WO CONTRAST  Result Date: 08/25/2023 CLINICAL DATA:  Increasing back pain and fatigue. No known injury. Multiple myeloma. EXAM: MRI PELVIS WITHOUT AND WITH CONTRAST TECHNIQUE: Multiplanar multisequence MR imaging of the pelvis was  performed both before and after administration of intravenous contrast. CONTRAST:  6mL GADAVIST GADOBUTROL 1 MMOL/ML IV SOLN COMPARISON:  Metastatic bone survey 08/05/2023. CT lumbar spine 03/27/2022. FINDINGS: Bones/Joint/Cartilage There are multiple lytic enhancing lesions suspicious for progressive myeloma. A large lesion involving the right aspect of the L5 vertebral body measures approximately 3.4 x 3.7 cm on image 5/15 and is associated with a probable mild pathologic fracture with paraspinal enhancement. No significant epidural tumor. There is a 2.6 x 1.8 cm lesion in the left sacral ala on image 10/15. In the right iliac bone, there is 1.9 x 1.0 cm lesion lateral to the sacroiliac joint on image 16/15. A lesion in the left ischium measures up to 1.9 x 2.1 cm on axial image 38/15. This measures up to 4.3 cm on coronal image 14/16 and was seen on the recent bone survey. No abnormality identified in the right pubic rami. No proximal femoral lesions are identified. No significant hip arthropathy or effusion. The sacroiliac joints are intact. Ligaments No significant ligamentous findings. Muscles and Tendons No focal muscular atrophy, edema or abnormal enhancement. No intramuscular masses are identified. Mild common hamstring tendinosis bilaterally. Soft tissue No periarticular soft tissue masses, fluid collections or acute inflammation identified. No significant intrapelvic abnormalities identified status post hysterectomy. IMPRESSION: 1. Multiple enhancing lytic lesions suspicious for progressive myeloma. The largest lesion involves the right aspect of the L5 vertebral body and is associated with a probable mild pathologic fracture. 2. No significant epidural tumor or periarticular soft tissue masses identified. 3. No significant hip or sacroiliac arthropathy. Electronically Signed   By: Carey Bullocks M.D.   On: 08/25/2023 11:51    ASSESSMENT & PLAN Doris Wilkerson is a 77 y.o. female with medical  history significant for lambda light chain multiple myeloma who presents for a follow up visit. Ms. Blakey is not a candidate for bone marrow transplant based on her advanced age.    # Lambda Light Chain Multiple Myeloma--VGPR on maintenance --diagnosis confirmed with bone marrow biopsy and urine protein analysis.   --patient has good functional status and would be an excellent candidate for VRd chemotherapy. I do not believe she would be a bone marrow transplant candidate based on her age.  --Received VRD chemotherapy from 02/01/2021-07/12/2021 then started maintenance Revlimid on 07/19/2021.  --Due to rising lambda light chain, recommended to switch to Daratumumab/Pomalyst/Dexamethasone. --Received Dara/Pom/Dex from 03/27/2023-08/28/2023. --Due to rising lambda light chain, recommend to switch to Kyprolis/Dex 3 weeks on 1 week off, starting today. Plan: --Due to start Cycle 1, Day 1 of [place]/Dex today --Labs today show white blood cell count 5.2, hemoglobin 12.1, MCV 91.2, and platelets of 224.  Creatinine and LFTs are within normal limits. Myeloma labs pending today.  --Plan to obtain myeloma labs on Days 1 and 15.  --Proceed with treatment today without any dose modifications.  --Return to clinic weekly for  labs/treatment and follow up visit in 2 weeks  #Hypoglycemia: --Glucose was 97 today. --Patient is asymptomatic today. Encouraged to eat right before treatment and snacks every 2-3 hours.  --Patient will check her glucose levels on non-treatment days and follow up with PCP if non-fasting glucose is consistently 70 or below.  #Back Pain/Left Hip Pain  -- Currently well controlled on gabapentin and Tylenol --CT thoracic and lumbar spine showed no lytic lesions or sequelae of multiple myeloma --MRI thoracic and lumbar spine from 07/07/2022 for baseline imaging. Findings showed multiple bone lesions involving posterior T9 and T8, lumbar spine and upper sacrum consistent with multiple myeloma. No  extension into the canal. No pathologic fracture.  --continue follow up with Dr .Donalee Citrin, neurosugery --Continue to monitor  #Supportive Care --chemotherapy education complete  --port placement complete on 09/09/2023. EMLA cream sent today.  --zofran 8mg  q8H PRN and compazine 10mg  PO q6H for nausea -- acyclovir 400mg  PO BID for VCZ prophylaxis --2 year of zometa therapy completed in July 2024.  -- no prescription pain medication required at this time.   No orders of the defined types were placed in this encounter.  All questions were answered. The patient knows to call the clinic with any problems, questions or concerns.  I have spent a total of 30 minutes minutes of face-to-face and non-face-to-face time, preparing to see the patient, performing a medically appropriate examination, counseling and educating the patient, documenting clinical information in the electronic health record.   Georga Kaufmann PA-C Dept of Hematology and Oncology [facility] Phone: [phone]   09/13/2023 5:23 AM

## 2023-09-14 ENCOUNTER — Encounter: Payer: Self-pay | Admitting: Hematology and Oncology

## 2023-09-14 LAB — KAPPA/LAMBDA LIGHT CHAINS
Kappa free light chain: 11.9 mg/L (ref 3.3–19.4)
Kappa, lambda light chain ratio: 0.05 — ABNORMAL LOW (ref 0.26–1.65)
Lambda free light chains: 230.3 mg/L — ABNORMAL HIGH (ref 5.7–26.3)

## 2023-09-14 NOTE — Telephone Encounter (Signed)
-----   Message from Nurse Currie Paris sent at 09/11/2023  4:03 PM EDT ----- Regarding: First time Kyprolis. Pt of Dorsey First time [employer]. Pt of Dorsey. Tolerated well. VSS. Please call to check in with patient, thanks!

## 2023-09-14 NOTE — Telephone Encounter (Signed)
Called & left message on identified vm to call us to let us know how she did with her recent treatment.

## 2023-09-15 ENCOUNTER — Other Ambulatory Visit: Payer: Self-pay | Admitting: Family Medicine

## 2023-09-15 DIAGNOSIS — E039 Hypothyroidism, unspecified: Secondary | ICD-10-CM

## 2023-09-18 ENCOUNTER — Inpatient Hospital Stay: Payer: Medicare Other

## 2023-09-18 ENCOUNTER — Other Ambulatory Visit: Payer: Self-pay

## 2023-09-18 VITALS — BP 122/67 | HR 65 | Temp 98.1°F | Resp 16 | Wt 139.5 lb

## 2023-09-18 DIAGNOSIS — C9000 Multiple myeloma not having achieved remission: Secondary | ICD-10-CM

## 2023-09-18 DIAGNOSIS — Z7982 Long term (current) use of aspirin: Secondary | ICD-10-CM | POA: Diagnosis not present

## 2023-09-18 DIAGNOSIS — Z7961 Long term (current) use of immunomodulator: Secondary | ICD-10-CM | POA: Diagnosis not present

## 2023-09-18 DIAGNOSIS — Z79624 Long term (current) use of inhibitors of nucleotide synthesis: Secondary | ICD-10-CM | POA: Diagnosis not present

## 2023-09-18 DIAGNOSIS — E162 Hypoglycemia, unspecified: Secondary | ICD-10-CM | POA: Diagnosis not present

## 2023-09-18 DIAGNOSIS — Z5112 Encounter for antineoplastic immunotherapy: Secondary | ICD-10-CM | POA: Diagnosis not present

## 2023-09-18 DIAGNOSIS — Z95828 Presence of other vascular implants and grafts: Secondary | ICD-10-CM

## 2023-09-18 LAB — MULTIPLE MYELOMA PANEL, SERUM
Albumin SerPl Elph-Mcnc: 3.7 g/dL (ref 2.9–4.4)
Albumin/Glob SerPl: 1.7 (ref 0.7–1.7)
Alpha 1: 0.2 g/dL (ref 0.0–0.4)
Alpha2 Glob SerPl Elph-Mcnc: 0.8 g/dL (ref 0.4–1.0)
B-Globulin SerPl Elph-Mcnc: 0.9 g/dL (ref 0.7–1.3)
Gamma Glob SerPl Elph-Mcnc: 0.3 g/dL — ABNORMAL LOW (ref 0.4–1.8)
Globulin, Total: 2.2 g/dL (ref 2.2–3.9)
IgA: 55 mg/dL — ABNORMAL LOW (ref 64–422)
IgG (Immunoglobin G), Serum: 474 mg/dL — ABNORMAL LOW (ref 586–1602)
IgM (Immunoglobulin M), Srm: 12 mg/dL — ABNORMAL LOW (ref 26–217)
M Protein SerPl Elph-Mcnc: 0.2 g/dL — ABNORMAL HIGH
Total Protein ELP: 5.9 g/dL — ABNORMAL LOW (ref 6.0–8.5)

## 2023-09-18 LAB — CMP (CANCER CENTER ONLY)
ALT: 15 U/L (ref 0–44)
AST: 15 U/L (ref 15–41)
Albumin: 4 g/dL (ref 3.5–5.0)
Alkaline Phosphatase: 70 U/L (ref 38–126)
Anion gap: 6 (ref 5–15)
BUN: 16 mg/dL (ref 8–23)
CO2: 25 mmol/L (ref 22–32)
Calcium: 9.6 mg/dL (ref 8.9–10.3)
Chloride: 109 mmol/L (ref 98–111)
Creatinine: 0.69 mg/dL (ref 0.44–1.00)
GFR, Estimated: >60 mL/min (ref >60)
Glucose, Bld: 97 mg/dL (ref 70–99)
Potassium: 4 mmol/L (ref 3.5–5.1)
Sodium: 140 mmol/L (ref 135–145)
Total Bilirubin: 0.6 mg/dL (ref 0.3–1.2)
Total Protein: 6.2 g/dL — ABNORMAL LOW (ref 6.5–8.1)

## 2023-09-18 LAB — CBC WITH DIFFERENTIAL (CANCER CENTER ONLY)
Abs Immature Granulocytes: 0 10*3/uL (ref 0.00–0.07)
Basophils Absolute: 0.1 10*3/uL (ref 0.0–0.1)
Basophils Relative: 2 %
Eosinophils Absolute: 0.7 10*3/uL — ABNORMAL HIGH (ref 0.0–0.5)
Eosinophils Relative: 14 %
HCT: 34.7 % — ABNORMAL LOW (ref 36.0–46.0)
Hemoglobin: 11.8 g/dL — ABNORMAL LOW (ref 12.0–15.0)
Immature Granulocytes: 0 %
Lymphocytes Relative: 11 %
Lymphs Abs: 0.6 10*3/uL — ABNORMAL LOW (ref 0.7–4.0)
MCH: 30.6 pg (ref 26.0–34.0)
MCHC: 34 g/dL (ref 30.0–36.0)
MCV: 90.1 fL (ref 80.0–100.0)
Monocytes Absolute: 0.8 10*3/uL (ref 0.1–1.0)
Monocytes Relative: 14 %
Neutro Abs: 3.1 10*3/uL (ref 1.7–7.7)
Neutrophils Relative %: 59 %
Platelet Count: 287 10*3/uL (ref 150–400)
RBC: 3.85 MIL/uL — ABNORMAL LOW (ref 3.87–5.11)
RDW: 14.4 % (ref 11.5–15.5)
WBC Count: 5.2 10*3/uL (ref 4.0–10.5)
nRBC: 0 % (ref 0.0–0.2)

## 2023-09-18 MED ORDER — SODIUM CHLORIDE 0.9% FLUSH
10.0000 mL | INTRAVENOUS | Status: DC | PRN
  Administered 2023-09-18: 10 mL

## 2023-09-18 MED ORDER — SODIUM CHLORIDE 0.9 % IV SOLN
INTRAVENOUS | Status: DC
Start: 2023-09-18 — End: 2023-09-18

## 2023-09-18 MED ORDER — HEPARIN SOD (PORK) LOCK FLUSH 100 UNIT/ML IV SOLN
500.0000 [IU] | Freq: Once | INTRAVENOUS | Status: AC | PRN
  Administered 2023-09-18: 500 [IU]

## 2023-09-18 MED ORDER — SODIUM CHLORIDE 0.9% FLUSH
10.0000 mL | Freq: Once | INTRAVENOUS | Status: AC
  Administered 2023-09-18: 10 mL

## 2023-09-18 MED ORDER — SODIUM CHLORIDE 0.9 % IV SOLN: Freq: Once | INTRAVENOUS | Status: AC

## 2023-09-18 MED ORDER — DEXTROSE 5 % IV SOLN
70.0000 mg/m2 | Freq: Once | INTRAVENOUS | Status: AC
  Administered 2023-09-18: 120 mg via INTRAVENOUS
  Filled 2023-09-18: qty 60

## 2023-09-18 NOTE — Patient Instructions (Signed)
[facility]  Discharge Instructions: Thank you for choosing [facility] to provide your oncology and hematology care.   If you have a lab appointment with the Cancer Center, please go directly to the Cancer Center and check in at the registration area.   Wear comfortable clothing and clothing appropriate for easy access to any Portacath or PICC line.   We strive to give you quality time with your provider. You may need to reschedule your appointment if you arrive late (15 or more minutes).  Arriving late affects you and other patients whose appointments are after yours.  Also, if you miss three or more appointments without notifying the office, you may be dismissed from the clinic at the provider's discretion.      For prescription refill requests, have your pharmacy contact our office and allow 72 hours for refills to be completed.    Today you received the following chemotherapy and/or immunotherapy agents: Kyprolis      To help prevent nausea and vomiting after your treatment, we encourage you to take your nausea medication as directed.  BELOW ARE SYMPTOMS THAT SHOULD BE REPORTED IMMEDIATELY: *FEVER GREATER THAN 100.4 F (38 C) OR HIGHER *CHILLS OR SWEATING *NAUSEA AND VOMITING THAT IS NOT CONTROLLED WITH YOUR NAUSEA MEDICATION *UNUSUAL SHORTNESS OF BREATH *UNUSUAL BRUISING OR BLEEDING *URINARY PROBLEMS (pain or burning when urinating, or frequent urination) *BOWEL PROBLEMS (unusual diarrhea, constipation, pain near the anus) TENDERNESS IN MOUTH AND THROAT WITH OR WITHOUT PRESENCE OF ULCERS (sore throat, sores in mouth, or a toothache) UNUSUAL RASH, SWELLING OR PAIN  UNUSUAL VAGINAL DISCHARGE OR ITCHING   Items with * indicate a potential emergency and should be followed up as soon as possible or go to the Emergency Department if any problems should occur.  Please show the CHEMOTHERAPY ALERT CARD or IMMUNOTHERAPY ALERT CARD at  check-in to the Emergency Department and triage nurse.  Should you have questions after your visit or need to cancel or reschedule your appointment, please contact [facility] [facility]  Dept: [phone]  and follow the prompts.  Office hours are 8:00 a.m. to 4:30 p.m. Monday - Friday. Please note that voicemails left after 4:00 p.m. may not be returned until the following business day.  We are closed weekends and major holidays. You have access to a nurse at all times for urgent questions. Please call the main number to the clinic Dept: [phone] and follow the prompts.   For any non-urgent questions, you may also contact your provider using MyChart. We now offer e-Visits for anyone 22 and older to request care online for non-urgent symptoms. For details visit [email or web address].   Also download the MyChart app! Go to the app store, search "MyChart", open the app, select [place], and log in with your MyChart username and password.  Carfilzomib Injection What is this medication? CARFILZOMIB (kar FILZ oh mib) treats multiple myeloma, a type of bone marrow cancer. It works by blocking a protein that causes cancer cells to grow and multiply. This helps to slow or stop the spread of cancer cells. This medicine may be used for other purposes; ask your health care provider or pharmacist if you have questions. COMMON BRAND NAME(S): KYPROLIS What should I tell my care team before I take this medication? They need to know if you have any of these conditions: Heart disease History of blood clots Irregular heartbeat Kidney disease Liver disease Lung or breathing  disease An unusual or allergic reaction to carfilzomib, or other medications, foods, dyes, or preservatives If you or your partner are pregnant or trying to get pregnant Breastfeeding How should I use this medication? This medication is injected into a vein. It is given by your care team in a hospital  or clinic setting. Talk to your care team about the use of this medication in children. Special care may be needed. Overdosage: If you think you have taken too much of this medicine contact a poison control center or emergency room at once. NOTE: This medicine is only for you. Do not share this medicine with others. What if I miss a dose? Keep appointments for follow-up doses. It is important not to miss your dose. Call your care team if you are unable to keep an appointment. What may interact with this medication? Interactions are not expected. This list may not describe all possible interactions. Give your health care provider a list of all the medicines, herbs, non-prescription drugs, or dietary supplements you use. Also tell them if you smoke, drink alcohol, or use illegal drugs. Some items may interact with your medicine. What should I watch for while using this medication? Your condition will be monitored carefully while you are receiving this medication. You may need blood work while taking this medication. Check with your care team if you have severe diarrhea, nausea, and vomiting, or if you sweat a lot. The loss of too much body fluid may make it dangerous for you to take this medication. This medication may affect your coordination, reaction time, or judgment. Do not drive or operate machinery until you know how this medication affects you. Sit up or stand slowly to reduce the risk of dizzy or fainting spells. Drinking alcohol with this medication can increase the risk of these side effects. Talk to your care team if you may be pregnant. Serious birth defects can occur if you take this medication during pregnancy and for 6 months after the last dose. You will need a negative pregnancy test before starting this medication. Contraception is recommended while taking this medication and for 6 months after the last dose. Your care team can help you find an option that works for you. If your  partner can get pregnant, use a condom during sex while taking this medication and for 3 months after the last dose. Do not breastfeed while taking this medication and for 2 weeks after the last dose. This medication may cause infertility. Talk to your care team if you are concerned about your fertility. What side effects may I notice from receiving this medication? Side effects that you should report to your care team as soon as possible: Allergic reactions--skin rash, itching, hives, swelling of the face, lips, tongue, or throat Bleeding--bloody or black, tar-like stools, vomiting blood or brown material that looks like coffee grounds, red or dark brown urine, small red or purple spots on skin, unusual bruising or bleeding Blood clot--pain, swelling, or warmth in the leg, shortness of breath, chest pain Dizziness, loss of balance or coordination, confusion or trouble speaking Heart attack--pain or tightness in the chest, shoulders, arms, or jaw, nausea, shortness of breath, cold or clammy skin, feeling faint or lightheaded Heart failure--shortness of breath, swelling of the ankles, feet, or hands, sudden weight gain, unusual weakness or fatigue Heart rhythm changes--fast or irregular heartbeat, dizziness, feeling faint or lightheaded, chest pain, trouble breathing Increase in blood pressure Infection--fever, chills, cough, sore throat, wounds that don't heal, pain or  trouble when passing urine, general feeling of discomfort or being unwell Infusion reactions--chest pain, shortness of breath or trouble breathing, feeling faint or lightheaded Kidney injury--decrease in the amount of urine, swelling of the ankles, hands, or feet Liver injury--right upper belly pain, loss of appetite, nausea, light-colored stool, dark yellow or brown urine, yellowing skin or eyes, unusual weakness or fatigue Lung injury--shortness of breath or trouble breathing, cough, spitting up blood, chest pain, fever Pulmonary  hypertension--shortness of breath, chest pain, fast or irregular heartbeat, feeling faint or lightheaded, fatigue, swelling of the ankles or feet Stomach pain, bloody diarrhea, pale skin, unusual weakness or fatigue, decrease in the amount of urine, which may be signs of hemolytic uremic syndrome Sudden and severe headache, confusion, change in vision, seizures, which may be signs of posterior reversible encephalopathy syndrome (PRES) TTP--purple spots on the skin or inside the mouth, pale skin, yellowing skin or eyes, unusual weakness or fatigue, fever, fast or irregular heartbeat, confusion, change in vision, trouble speaking, trouble walking Tumor lysis syndrome (TLS)--nausea, vomiting, diarrhea, decrease in the amount of urine, dark urine, unusual weakness or fatigue, confusion, muscle pain or cramps, fast or irregular heartbeat, joint pain Side effects that usually do not require medical attention (report to your care team if they continue or are bothersome): Diarrhea Fatigue Nausea Trouble sleeping This list may not describe all possible side effects. Call your doctor for medical advice about side effects. You may report side effects to FDA at 1-800-FDA-1088. Where should I keep my medication? This medication is given in a hospital or clinic. It will not be stored at home. NOTE: This sheet is a summary. It may not cover all possible information. If you have questions about this medicine, talk to your doctor, pharmacist, or health care provider.  2024 [employer]vier/Gold Standard (2022-04-10 00:00:00)

## 2023-09-18 NOTE — Progress Notes (Signed)
Patient took her dexamethasone at home.

## 2023-09-24 ENCOUNTER — Other Ambulatory Visit: Payer: Medicare Other

## 2023-09-24 ENCOUNTER — Ambulatory Visit: Payer: Medicare Other | Admitting: Physician Assistant

## 2023-09-24 ENCOUNTER — Ambulatory Visit: Payer: Medicare Other

## 2023-09-25 ENCOUNTER — Inpatient Hospital Stay: Payer: Medicare Other | Attending: Physician Assistant

## 2023-09-25 ENCOUNTER — Inpatient Hospital Stay: Payer: Medicare Other

## 2023-09-25 ENCOUNTER — Inpatient Hospital Stay (HOSPITAL_BASED_OUTPATIENT_CLINIC_OR_DEPARTMENT_OTHER): Payer: Medicare Other | Admitting: Hematology

## 2023-09-25 ENCOUNTER — Ambulatory Visit: Payer: Medicare Other | Admitting: "Endocrinology

## 2023-09-25 VITALS — BP 123/65 | HR 90 | Temp 97.3°F | Resp 18 | Wt 136.6 lb

## 2023-09-25 VITALS — BP 141/92 | HR 82

## 2023-09-25 DIAGNOSIS — Z79624 Long term (current) use of inhibitors of nucleotide synthesis: Secondary | ICD-10-CM | POA: Diagnosis not present

## 2023-09-25 DIAGNOSIS — Z5111 Encounter for antineoplastic chemotherapy: Secondary | ICD-10-CM | POA: Diagnosis not present

## 2023-09-25 DIAGNOSIS — Z5112 Encounter for antineoplastic immunotherapy: Secondary | ICD-10-CM | POA: Insufficient documentation

## 2023-09-25 DIAGNOSIS — Z7982 Long term (current) use of aspirin: Secondary | ICD-10-CM | POA: Diagnosis not present

## 2023-09-25 DIAGNOSIS — C9000 Multiple myeloma not having achieved remission: Secondary | ICD-10-CM

## 2023-09-25 DIAGNOSIS — Z79899 Other long term (current) drug therapy: Secondary | ICD-10-CM | POA: Diagnosis not present

## 2023-09-25 DIAGNOSIS — Z95828 Presence of other vascular implants and grafts: Secondary | ICD-10-CM

## 2023-09-25 LAB — CMP (CANCER CENTER ONLY)
ALT: 17 U/L (ref 0–44)
AST: 19 U/L (ref 15–41)
Albumin: 4.2 g/dL (ref 3.5–5.0)
Alkaline Phosphatase: 74 U/L (ref 38–126)
Anion gap: 7 (ref 5–15)
BUN: 22 mg/dL (ref 8–23)
CO2: 26 mmol/L (ref 22–32)
Calcium: 10.1 mg/dL (ref 8.9–10.3)
Chloride: 106 mmol/L (ref 98–111)
Creatinine: 0.75 mg/dL (ref 0.44–1.00)
GFR, Estimated: >60 mL/min (ref >60)
Glucose, Bld: 143 mg/dL — ABNORMAL HIGH (ref 70–99)
Potassium: 4.3 mmol/L (ref 3.5–5.1)
Sodium: 139 mmol/L (ref 135–145)
Total Bilirubin: 0.6 mg/dL (ref 0.3–1.2)
Total Protein: 6.5 g/dL (ref 6.5–8.1)

## 2023-09-25 LAB — CBC WITH DIFFERENTIAL (CANCER CENTER ONLY)
Abs Immature Granulocytes: 0.04 10*3/uL (ref 0.00–0.07)
Basophils Absolute: 0.1 10*3/uL (ref 0.0–0.1)
Basophils Relative: 1 %
Eosinophils Absolute: 0.1 10*3/uL (ref 0.0–0.5)
Eosinophils Relative: 2 %
HCT: 35.6 % — ABNORMAL LOW (ref 36.0–46.0)
Hemoglobin: 11.9 g/dL — ABNORMAL LOW (ref 12.0–15.0)
Immature Granulocytes: 0 %
Lymphocytes Relative: 4 %
Lymphs Abs: 0.4 10*3/uL — ABNORMAL LOW (ref 0.7–4.0)
MCH: 30.1 pg (ref 26.0–34.0)
MCHC: 33.4 g/dL (ref 30.0–36.0)
MCV: 90.1 fL (ref 80.0–100.0)
Monocytes Absolute: 0.2 10*3/uL (ref 0.1–1.0)
Monocytes Relative: 2 %
Neutro Abs: 8.5 10*3/uL — ABNORMAL HIGH (ref 1.7–7.7)
Neutrophils Relative %: 91 %
Platelet Count: 207 10*3/uL (ref 150–400)
RBC: 3.95 MIL/uL (ref 3.87–5.11)
RDW: 14.9 % (ref 11.5–15.5)
WBC Count: 9.4 10*3/uL (ref 4.0–10.5)
nRBC: 0 % (ref 0.0–0.2)

## 2023-09-25 MED ORDER — HEPARIN SOD (PORK) LOCK FLUSH 100 UNIT/ML IV SOLN
500.0000 [IU] | Freq: Once | INTRAVENOUS | Status: AC | PRN
Start: 2023-09-25 — End: 2023-09-25
  Administered 2023-09-25: 500 [IU]

## 2023-09-25 MED ORDER — SODIUM CHLORIDE 0.9 % IV SOLN: Freq: Once | INTRAVENOUS | Status: AC

## 2023-09-25 MED ORDER — SODIUM CHLORIDE 0.9 % IV SOLN: INTRAVENOUS | Status: DC

## 2023-09-25 MED ORDER — SODIUM CHLORIDE 0.9% FLUSH
10.0000 mL | INTRAVENOUS | Status: DC | PRN
  Administered 2023-09-25: 10 mL

## 2023-09-25 MED ORDER — DEXTROSE 5 % IV SOLN
70.0000 mg/m2 | Freq: Once | INTRAVENOUS | Status: AC
  Administered 2023-09-25: 120 mg via INTRAVENOUS
  Filled 2023-09-25: qty 60

## 2023-09-25 MED ORDER — SODIUM CHLORIDE 0.9% FLUSH
10.0000 mL | Freq: Once | INTRAVENOUS | Status: AC
  Administered 2023-09-25: 10 mL

## 2023-09-25 NOTE — Patient Instructions (Signed)
[facility]  Discharge Instructions: Thank you for choosing [facility] to provide your oncology and hematology care.   If you have a lab appointment with the Cancer Center, please go directly to the Cancer Center and check in at the registration area.   Wear comfortable clothing and clothing appropriate for easy access to any Portacath or PICC line.   We strive to give you quality time with your provider. You may need to reschedule your appointment if you arrive late (15 or more minutes).  Arriving late affects you and other patients whose appointments are after yours.  Also, if you miss three or more appointments without notifying the office, you may be dismissed from the clinic at the provider's discretion.      For prescription refill requests, have your pharmacy contact our office and allow 72 hours for refills to be completed.    Today you received the following chemotherapy and/or immunotherapy agents :  Kyprolis   To help prevent nausea and vomiting after your treatment, we encourage you to take your nausea medication as directed.  BELOW ARE SYMPTOMS THAT SHOULD BE REPORTED IMMEDIATELY: *FEVER GREATER THAN 100.4 F (38 C) OR HIGHER *CHILLS OR SWEATING *NAUSEA AND VOMITING THAT IS NOT CONTROLLED WITH YOUR NAUSEA MEDICATION *UNUSUAL SHORTNESS OF BREATH *UNUSUAL BRUISING OR BLEEDING *URINARY PROBLEMS (pain or burning when urinating, or frequent urination) *BOWEL PROBLEMS (unusual diarrhea, constipation, pain near the anus) TENDERNESS IN MOUTH AND THROAT WITH OR WITHOUT PRESENCE OF ULCERS (sore throat, sores in mouth, or a toothache) UNUSUAL RASH, SWELLING OR PAIN  UNUSUAL VAGINAL DISCHARGE OR ITCHING   Items with * indicate a potential emergency and should be followed up as soon as possible or go to the Emergency Department if any problems should occur.  Please show the CHEMOTHERAPY ALERT CARD or IMMUNOTHERAPY ALERT CARD at  check-in to the Emergency Department and triage nurse.  Should you have questions after your visit or need to cancel or reschedule your appointment, please contact [facility]  Dept: [phone]  and follow the prompts.  Office hours are 8:00 a.m. to 4:30 p.m. Monday - Friday. Please note that voicemails left after 4:00 p.m. may not be returned until the following business day.  We are closed weekends and major holidays. You have access to a nurse at all times for urgent questions. Please call the main number to the clinic Dept: [phone] and follow the prompts.   For any non-urgent questions, you may also contact your provider using MyChart. We now offer e-Visits for anyone 18 and older to request care online for non-urgent symptoms. For details visit [email or web address].   Also download the MyChart app! Go to the app store, search "MyChart", open the app, select [facility], and log in with your MyChart username and password.   

## 2023-09-25 NOTE — Progress Notes (Signed)
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 Concerning for multiple myeloma vs metastatic disease 12/31/2020: establish care with Dr. Leonides Schanz. UPEP showed marked Bence Jones protein, findings concerning for multiple myleoma 01/21/2021: bone marrow biopsy confirms multiple myeloma with atypical plasma cells representing 28% of all cells in the aspirate  02/01/2021: Cycle 1 Day 1 of VRd chemotherapy 02/22/2021: Cycle 2 Day 1 of VRd chemotherapy 03/15/2021: Cycle 3 Day 1 of VRd chemotherapy 04/05/2021: Cycle 4 Day 1 of VRd chemotherapy 04/26/2021: Cycle 5 Day 1 of VRd chemotherapy 05/17/2021: Cycle 6 Day 1 of VRd chemotherapy 06/07/2021:  Cycle 7 Day 1 of VRd chemotherapy 06/28/2021: Cycle 8 Day 1 of VRd chemotherapy 07/19/2021: start maintenance revlimid 10mg  PO daily.  03/26/2022: Cycle 1 Day 1 of Dara/Pom/Dex 04/24/2023: Cycle 2 Day 1 of Dara/Pom/Dex 05/22/2023: Cycle 3 Day 1 of Dara/Pom/Dex 06/19/2023: Cycle 4 Day 1 of Dara/Pom/Dex 07/17/2023: Cycle 5 Day 1 of Dara/Pom/Dex 08/28/2023: d/c Dara/Pom/Dex due to progression.   HISTORY OF PRESENTING ILLNESS:  Doris Wilkerson is a wonderful 77 y.o. female who has been a previous patient of Dr. Leonides Schanz. She is here for evaluation and management of lambda light chain multiple myeloma.   She was last seen by Dr. Leonides Schanz on 08/28/2023 and patient was doing well overall with no acute new symptoms. However, there was apparent progression with the increase in lambda light chains  and need to switch therapies from Dara/Pom/Dex. Kyprolis therapy was discussed and patient was agreeable to change therapies.  Patient was seen by Thayil PA on 09/11/2023 prior to receiving cycle 1 day 1 of Kyprolis/Dex. She complained of persistent fatigue limiting her daily activities.  Today, she is here for follow-up for her relapsed myeloma and is here for toxicity check prior to receiving cycle 1 day 15 of Kyprolis. Patient is accompanied by her husband during today's visit.  Patient reports previously endorsing chest pain and SOB from Darzalex.   She continues to be on calcium channel blocker at this time. Patient is not on zyrtec at this time.   Patient does take 10 tablets of Dexamethasone at home once a week. She notes that she took 10 tablets this morning. Patient was previously taking 20 MG dexamethasone.   She has noticed worsened gastrointestinal symptoms. Patient also complains of more frequent headaches which she does not believe is related to her treatment as it persisted afterwards.   She denies any fever, nausea, mouth sores, diarrhea  Patient does report worsened ankle swelling, L>R. She uses compression socks every other day.   She complains of  fatigue and notes that she sleeps for 12 hours on some days. Patient reports that she generally feels well overall on weekends and typically "crashes" on Monday/Tuesday. She notes that her fatigue did last until Wednesday this week.   Patient reports some discomfort with her recent port-a-cath placement on 09/09/2023.   Her appetite is fairly good. Her weight in clinic today is 136 pounds.   She denies any back pain or abdominal pain at this time.   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 She reports increased numbness.   She did get an MRI recently but denies having a PET scan previously. She reports some discomfort from her bone lesions in her lower back. Patient reports that her  pain is present on her left side especially when sitting for extended periods. She notes that her pain began in the front then radiated to her side/back. She reports pain with certain positions.   She reports that her disease progression was on her right side and she was previously symptomatic while on daratumumab. Her right sided pain has improved at this time.  She generally takes one Tylenol in the morning and one in the afternoon.   MEDICAL HISTORY:  Past Medical History:  Diagnosis Date   Asthma    Cataract    GERD (gastroesophageal reflux disease)    Hyperlipidemia    Hypertension    Thyroid disease     SURGICAL HISTORY: Past Surgical History:  Procedure Laterality Date   Cataract Surgery     CORONARY ANGIOPLASTY WITH STENT PLACEMENT     EYE SURGERY     IR IMAGING GUIDED PORT INSERTION  09/09/2023   ORTHOPEDIC SURGERY  2012   TONSILLECTOMY  1951    SOCIAL HISTORY: Social History   Socioeconomic History   Marital status: Married    Spouse name: Not on file   Number of children: 0   Years of education: Not on file   Highest education level: Master's degree (e.g., MA, MS, MEng, MEd, MSW, MBA)  Occupational History   Occupation: unemployed  Tobacco Use   Smoking status: Never   Smokeless tobacco: Never  Vaping Use   Vaping status: Never Used  Substance and Sexual Activity   Alcohol use: Yes    Alcohol/week: 1.0 standard drink of alcohol    Types: 1 Glasses of wine per week    Comment: occassionally   Drug use: No   Sexual activity: Not Currently  Other Topics Concern   Not on file  Social History Narrative   Married. Education: college. Pt does exercise-   Social Determinants of Health   Financial Resource Strain: Low Risk  (04/24/2023)   Overall Financial Resource Strain (CARDIA)    Difficulty of Paying Living Expenses: Not hard at all  Food Insecurity: No Food Insecurity (04/24/2023)   Hunger Vital Sign    Worried About Running Out of Food in the Last  Year: Never true    Ran Out of Food in the Last Year: Never true  Transportation Needs: No Transportation Needs (04/24/2023)   PRAPARE - Administrator, Civil Service (Medical): No    Lack of Transportation (Non-Medical): No  Physical Activity: Insufficiently Active (04/24/2023)   Exercise Vital Sign    Days of Exercise per Week: 7 days    Minutes of Exercise per Session: 20 min  Stress: No Stress Concern Present (04/24/2023)   [employer] of Occupational Health - Occupational Stress Questionnaire    Feeling of Stress : Only a little  Social Connections: Moderately Integrated (04/24/2023)   Social Connection and Isolation Panel [NHANES]    Frequency of Communication with Friends and Family: Twice a week    Frequency of Social Gatherings with Friends and Family: Once a week    Attends Religious Services: Never    Database administrator or Organizations: Yes    Attends Engineer, structural: More than 4 times per year    Marital Status: Married  Intimate Partner Violence: Not At Risk (04/08/2023)   Humiliation, Afraid, Rape, and Kick questionnaire    Fear of Current or Ex-Partner: No    Emotionally Abused: No    Physically Abused: No    Sexually Abused: No    FAMILY HISTORY: Family History  Problem Relation Age of Onset   Cancer Father    Heart disease Brother    Dementia Brother    Leukemia Brother    Breast cancer Neg Hx    Colon cancer Neg Hx    Esophageal cancer Neg Hx    Pancreatic cancer Neg Hx    Rectal cancer Neg Hx    Stomach cancer Neg Hx     ALLERGIES:  is allergic to poison ivy extract, plavix [clopidogrel bisulfate], and other.  MEDICATIONS:  Current Outpatient Medications  Medication Sig Dispense Refill   acyclovir (ZOVIRAX) 400 MG tablet Take 1 tablet (400 mg total) by mouth 2 (two) times daily. 180 tablet 2   amLODipine (NORVASC) 5 MG tablet Take 1 tablet (5 mg total) by mouth daily. 30 tablet 2   aspirin 81 MG tablet Take 81 mg by  mouth daily.      atorvastatin (LIPITOR) 80 MG tablet TAKE 1 TABLET(80 MG) BY MOUTH DAILY AT 6 PM 90 tablet 1   benazepril (LOTENSIN) 10 MG tablet TAKE 1 TABLET(10 MG) BY MOUTH DAILY 90 tablet 1   Blood Glucose Monitoring Suppl DEVI 1 each by Does not apply route as needed (symptoms of low blood sugar.). May substitute to any manufacturer covered by patient's insurance. 1 each 0   calcium carbonate (TUMS - DOSED IN MG ELEMENTAL CALCIUM) 500 MG chewable tablet Chew 1 tablet by mouth daily. Daily PRN     cetirizine (ZYRTEC) 10 MG chewable tablet Chew 10 mg by mouth daily.     cholecalciferol (VITAMIN D3) 25 MCG (1000 UNIT) tablet Take 2,000 Units by mouth daily.     dexamethasone (DECADRON) 4 MG tablet Take 10 tablets (40 mg total) by mouth once a week. 30 tablet 5   gabapentin (NEURONTIN) 100 MG capsule TAKE 1 CAPSULE(100 MG) BY MOUTH TWICE DAILY AS NEEDED 180 capsule 1   levothyroxine (SYNTHROID) 25 MCG tablet TAKE 1 TABLET(25 MCG) BY MOUTH DAILY BEFORE BREAKFAST 90 tablet 3   lidocaine-prilocaine (EMLA) cream Apply 1 Application topically as needed. 30 g 0   Magnesium 100 MG TABS Take 100 mg by mouth daily.     nitroGLYCERIN (NITROSTAT) 0.4 MG SL tablet Place 1 tablet (0.4 mg total) under the tongue every 5 (five) minutes as needed for chest pain. 25 tablet 1   polyethylene glycol (MIRALAX / GLYCOLAX) 17 g packet Take 17 g by mouth daily as needed.     No current facility-administered medications for this visit.    REVIEW OF SYSTEMS:    10 Point review of Systems was done is negative except as noted above.  PHYSICAL EXAMINATION: ECOG PERFORMANCE STATUS: 1 - Symptomatic but completely ambulatory  . Vitals:   09/25/23 1136  BP: 123/65  Pulse: 90  Resp: 18  Temp: (!) 97.3 F (36.3 C)  SpO2: 99%   Filed Weights   09/25/23 1136  Weight: 136 lb 9.6 oz (62 kg)   .Body mass index is 22.05 kg/m.  GENERAL:alert, in no acute distress and comfortable SKIN: no acute rashes, no  significant lesions EYES: conjunctiva are pink and non-injected, sclera anicteric OROPHARYNX: MMM, no exudates, no oropharyngeal erythema or ulceration NECK: supple, no JVD LYMPH:  no palpable lymphadenopathy in the cervical, axillary or inguinal regions LUNGS: clear to auscultation b/l with normal respiratory effort HEART: regular rate & rhythm ABDOMEN:  normoactive bowel sounds , non tender, not distended. Extremity: no pedal edema PSYCH: alert & oriented x 3 with fluent speech NEURO: no focal motor/sensory deficits  LABORATORY DATA:  I have reviewed the data as listed  .    Latest Ref Rng & Units 09/18/2023    7:48 AM 09/11/2023   12:41 PM 08/28/2023    9:58 AM  CBC  WBC 4.0 - 10.5 K/uL 5.2  5.2  6.4   Hemoglobin 12.0 - 15.0 g/dL 24.4  01.0  27.2   Hematocrit 36.0 - 46.0 % 34.7  36.4  36.8   Platelets 150 - 400 K/uL 287  224  239     .    Latest Ref Rng & Units 09/18/2023    7:48 AM 09/11/2023   12:41 PM 08/28/2023    9:58 AM  CMP  Glucose 70 - 99 mg/dL 97  97  89   BUN 8 - 23 mg/dL 16  14  14    Creatinine 0.44 - 1.00 mg/dL 5.36  6.44  0.34   Sodium 135 - 145 mmol/L 140  137  137   Potassium 3.5 - 5.1 mmol/L 4.0  3.7  3.8   Chloride 98 - 111 mmol/L 109  107  105   CO2 22 - 32 mmol/L 25  24  28    Calcium 8.9 - 10.3 mg/dL 9.6  9.7  9.8   Total Protein 6.5 - 8.1 g/dL 6.2  6.6  6.4   Total Bilirubin 0.3 - 1.2 mg/dL 0.6  0.6  0.6   Alkaline Phos 38 - 126 U/L 70  71  68   AST 15 - 41 U/L 15  17  16    ALT 0 - 44 U/L 15  15  14       RADIOGRAPHIC STUDIES: I have personally reviewed the radiological images as listed and agreed with the findings in the report. IR IMAGING GUIDED PORT INSERTION  Result Date: 09/09/2023 INDICATION: starting IV chemotherapy for multiple myeloma, requesting port EXAM: IMPLANTED PORT A CATH PLACEMENT WITH ULTRASOUND AND FLUOROSCOPIC GUIDANCE MEDICATIONS: None ANESTHESIA/SEDATION: Moderate (conscious) sedation was employed during this  procedure. A total of Versed 2 mg and Fentanyl 1 mcg was administered intravenously. Moderate Sedation Time: 22 minutes. The patient's level of consciousness and vital signs were monitored continuously by radiology nursing throughout the procedure under my direct supervision. FLUOROSCOPY TIME:  Fluoroscopic dose; 1 mGy COMPLICATIONS: None immediate. PROCEDURE: The procedure, risks, benefits, and alternatives were explained to the patient. Questions regarding the procedure were encouraged and answered. The patient understands and consents to the procedure. The RIGHT neck and chest were prepped with chlorhexidine in a sterile fashion, and a sterile drape was applied covering the operative field. Maximum barrier sterile technique with sterile gowns and gloves were used for the procedure. A timeout was performed prior to the initiation of the procedure. Local anesthesia was provided with 1% lidocaine with epinephrine. After creating a small venotomy incision, a micropuncture kit was utilized to access the internal jugular vein under direct, real-time ultrasound guidance. Ultrasound image documentation was performed. The microwire was kinked to measure appropriate catheter length. A subcutaneous port pocket was then created along the upper chest wall utilizing a combination of sharp and blunt dissection. The pocket was irrigated with sterile saline. A single lumen Non-ISP power injectable port was chosen  for placement. The 8 Fr catheter was tunneled from the port pocket site to the venotomy incision. The port was placed in the pocket. The external catheter was trimmed to appropriate length. At the venotomy, an 8 Fr peel-away sheath was placed over a guidewire under fluoroscopic guidance. The catheter was then placed through the sheath and the sheath was removed. Final catheter positioning was confirmed and documented with a fluoroscopic spot radiograph. The port was accessed with a Huber needle, aspirated and flushed  with heparinized saline. The port pocket incision was closed with interrupted 3-0 Vicryl suture then Dermabond was applied, including at the venotomy incision. Dressings were placed. The patient tolerated the procedure well without immediate post procedural complication. IMPRESSION: Successful placement of a RIGHT internal jugular approach power injectable Port-A-Cath. The tip of the catheter is positioned at the superior cavo-atrial junction. The catheter is ready for immediate use. Roanna Banning, MD [facility] Radiology Electronically Signed   By: Roanna Banning M.D.   On: 09/09/2023 14:00    ASSESSMENT & PLAN:  77 y.o. female with  Lambda Light Chain Multiple Myeloma PLAN:  -Discussed lab results on 09/25/23 in detail with patient. CBC stable, showed WBC of 9.4K, hemoglobin of 11.9, and platelets of 207K. -CMP stable -myeloma lab on 09/11/2023 showed M protein of 0.2 g/dL -discussed results of 9/52/8413 MRI in detail which showed a fracture at L5 -discussed results of 08/05/2023 bone survey -patient reports mild toxicities of more persistent fatigue and some headaches, which have been tolerable. She denies any severe toxicity issues.  -discussed that Carfilzomib is known to cause headaches, fatigue, and drug-related fevers sometimes. She can take Tylenol on the day of treatment when she goes home if needed. -educated patient that Carfilzomib has a lower risk of causing neuropathy -worsened ankle swelling is likely related to steroids -her more aggressive treatment regimen is reasonable considering her concerning genetic mutation, progressive lesions on MRI, and progression while on dara/pom.  -discussed option to increase her treatment dose at a slower rate if she develops any bothersome symptoms.  -not making any major changes to Dr. Derek Mound plan at this time -no indication to adjust treatment dose at this time -continue Carfilzomib/dexamethasone  treatment at current dose -she is appropriate to proceed with cycle 1 day 15 of treatment today -discussed option of radiation therapy down the line if there is any concern for progressed areas and local pain control or high risk of fracture. This is not a concern at this time -answered all of patient's questions in detail -she shall follow-up with Dr. Leonides Schanz with cycle 2, day 1 of treatment  FOLLOW-UP: The schedule cycle 2 of carfilzomib/dexamethasone as per integrated scheduling. Port flush and labs with each treatment Follow-up visit with Dr. Leonides Schanz with cycle 2-day 1  The total time spent in the appointment was 30 minutes* .  All of the patient's questions were answered with apparent satisfaction. The patient knows to call the clinic with any problems, questions or concerns.   Wyvonnia Lora MD MS AAHIVMS [facility] Hematology/Oncology Physician [facility]  .*Total Encounter Time as defined by the Centers for Medicare and Medicaid Services includes, in addition to the face-to-face time of a patient visit (documented in the note above) non-face-to-face time: obtaining and reviewing outside history, ordering and reviewing medications, tests or procedures, care coordination (communications with other health care professionals or caregivers) and documentation in the medical record.    I,Mitra Faeizi,acting as a Neurosurgeon for  Wyvonnia Lora, MD.,have documented all relevant documentation on the behalf of Wyvonnia Lora, MD,as directed by  Wyvonnia Lora, MD while in the presence of Wyvonnia Lora, MD.  .I have reviewed the above documentation for accuracy and completeness, and I agree with the above. Johney Maine MD

## 2023-09-25 NOTE — Progress Notes (Signed)
Patient states she took her dexamethasone at home this morning. 

## 2023-09-26 ENCOUNTER — Encounter: Payer: Self-pay | Admitting: Hematology and Oncology

## 2023-09-26 ENCOUNTER — Encounter: Payer: Self-pay | Admitting: Cardiology

## 2023-09-26 DIAGNOSIS — I10 Essential (primary) hypertension: Secondary | ICD-10-CM

## 2023-09-26 DIAGNOSIS — I25118 Atherosclerotic heart disease of native coronary artery with other forms of angina pectoris: Secondary | ICD-10-CM

## 2023-09-28 LAB — KAPPA/LAMBDA LIGHT CHAINS
Kappa free light chain: 4.2 mg/L (ref 3.3–19.4)
Kappa, lambda light chain ratio: 0.03 — ABNORMAL LOW (ref 0.26–1.65)
Lambda free light chains: 128.3 mg/L — ABNORMAL HIGH (ref 5.7–26.3)

## 2023-09-28 MED ORDER — AMLODIPINE BESYLATE 5 MG PO TABS: 5.0000 mg | ORAL_TABLET | Freq: Every day | ORAL | 1 refills | Status: DC

## 2023-09-29 LAB — MULTIPLE MYELOMA PANEL, SERUM
Albumin SerPl Elph-Mcnc: 3.7 g/dL (ref 2.9–4.4)
Albumin/Glob SerPl: 1.7 (ref 0.7–1.7)
Alpha 1: 0.2 g/dL (ref 0.0–0.4)
Alpha2 Glob SerPl Elph-Mcnc: 0.8 g/dL (ref 0.4–1.0)
B-Globulin SerPl Elph-Mcnc: 0.9 g/dL (ref 0.7–1.3)
Gamma Glob SerPl Elph-Mcnc: 0.3 g/dL — ABNORMAL LOW (ref 0.4–1.8)
Globulin, Total: 2.2 g/dL (ref 2.2–3.9)
IgA: 33 mg/dL — ABNORMAL LOW (ref 64–422)
IgG (Immunoglobin G), Serum: 423 mg/dL — ABNORMAL LOW (ref 586–1602)
IgM (Immunoglobulin M), Srm: 9 mg/dL — ABNORMAL LOW (ref 26–217)
M Protein SerPl Elph-Mcnc: 0.2 g/dL — ABNORMAL HIGH
Total Protein ELP: 5.9 g/dL — ABNORMAL LOW (ref 6.0–8.5)

## 2023-10-01 ENCOUNTER — Encounter: Payer: Self-pay | Admitting: Hematology and Oncology

## 2023-10-05 ENCOUNTER — Ambulatory Visit (INDEPENDENT_AMBULATORY_CARE_PROVIDER_SITE_OTHER): Payer: Medicare Other | Admitting: "Endocrinology

## 2023-10-05 ENCOUNTER — Encounter: Payer: Self-pay | Admitting: "Endocrinology

## 2023-10-05 VITALS — BP 120/80 | HR 81 | Ht 66.0 in | Wt 139.2 lb

## 2023-10-05 DIAGNOSIS — E162 Hypoglycemia, unspecified: Secondary | ICD-10-CM

## 2023-10-05 DIAGNOSIS — C9000 Multiple myeloma not having achieved remission: Secondary | ICD-10-CM | POA: Diagnosis not present

## 2023-10-05 LAB — GLUCOSE, POCT (MANUAL RESULT ENTRY): POC Glucose: 105 mg/dL — AB (ref 70–99)

## 2023-10-05 LAB — POCT GLYCOSYLATED HEMOGLOBIN (HGB A1C): Hemoglobin A1C: 5.8 % — AB (ref 4.0–5.6)

## 2023-10-05 MED ORDER — FREESTYLE LIBRE 3 SENSOR MISC: 1.0000 | 11 refills | Status: DC

## 2023-10-05 NOTE — Progress Notes (Signed)
Outpatient Endocrinology Note Doris [place], MD    Doris Wilkerson [birth year] [id number]  Referring Provider: Shade Flood, MD Primary Care Provider: Shade Flood, MD Reason for consultation: Subjective   Assessment & Plan  Diagnoses and all orders for this visit:  Hypoglycemia -     POCT Glucose (CBG) -     POCT glycosylated hemoglobin (Hb A1C) -     Cortisol -     ACTH -     DHEA-sulfate  Multiple myeloma not having achieved remission (HCC)  Other orders -     Continuous Glucose Sensor (FREESTYLE LIBRE 3 SENSOR) MISC; 1 Device by Does not apply route continuous. Place 1 sensor on the skin every 14 days. Use to check glucose continuously   Patient is on phase 3 of multiple myeloma treatment  Patient wore CGM (no date available) reports she had 2 episodes of low blood sugar, lowest being 53 (was on dexamethasone 20 mg weekly) Patient is now on dexamethasone 40 mg weekly as part of multiple myeloma treatment  Been on dexamethasone for treatment in 2022 and and in 2024 Ordered 8 am baseline cortisol to assess intrinsic adrenal function    A1C 5.8 Keep a watch for low BG day 3-7 post dexamethasone and A1C  Patient has BG meter and supplies Ordered libre 3  Return in about 3 months (around 01/05/2024) for visit and 8 am labs before next visit.   I have reviewed current medications, nurse's notes, allergies, vital signs, past medical and surgical history, family medical history, and social history for this encounter. Counseled patient on symptoms, examination findings, lab findings, imaging results, treatment decisions and monitoring and prognosis. The patient understood the recommendations and agrees with the treatment plan. All questions regarding treatment plan were fully answered.  Doris [place], MD  10/05/23   History of Present Illness HPI  Doris Wilkerson is a 77 y.o. year old female who presents for evaluation of hypoglycemia.  Diagnosed of  multiple myeloma in 2022  Patient is on phase 3 of multiple myeloma treatment which is not curable per patient  She was having low BG (lowest 53) during phase 2 of her treatment, and it  Patient was weak and wobbly on walking when she had low BG (no symptoms sitting down) Current BG 105 She is currently on 40 mg of dexamethasone weekly as part of treatment and plan is to stay on it indefinitely   Physical Exam  BP 120/80   Pulse 81   Ht 5\' 6"  (1.676 m)   Wt 139 lb 3.2 oz (63.1 kg)   SpO2 99%   BMI 22.47 kg/m    Constitutional: well developed, well nourished Head: normocephalic, atraumatic Eyes: sclera anicteric, no redness Neck: supple Lungs: normal respiratory effort Neurology: alert and oriented Skin: dry, no appreciable rashes Musculoskeletal: no appreciable defects Psychiatric: normal mood and affect   Current Medications Patient's Medications  New Prescriptions   CONTINUOUS GLUCOSE SENSOR (FREESTYLE LIBRE 3 SENSOR) MISC    1 Device by Does not apply route continuous. Place 1 sensor on the skin every 14 days. Use to check glucose continuously  Previous Medications   ACYCLOVIR (ZOVIRAX) 400 MG TABLET    Take 1 tablet (400 mg total) by mouth 2 (two) times daily.   AMLODIPINE (NORVASC) 5 MG TABLET    Take 1 tablet (5 mg total) by mouth daily.   ASPIRIN 81 MG TABLET    Take 81 mg by mouth daily.  ATORVASTATIN (LIPITOR) 80 MG TABLET    TAKE 1 TABLET(80 MG) BY MOUTH DAILY AT 6 PM   BENAZEPRIL (LOTENSIN) 10 MG TABLET    TAKE 1 TABLET(10 MG) BY MOUTH DAILY   BLOOD GLUCOSE MONITORING SUPPL DEVI    1 each by Does not apply route as needed (symptoms of low blood sugar.). May substitute to any manufacturer covered by patient's insurance.   CALCIUM CARBONATE (TUMS - DOSED IN MG ELEMENTAL CALCIUM) 500 MG CHEWABLE TABLET    Chew 1 tablet by mouth daily. Daily PRN   CARFILZOMIB (KYPROLIS) 10 MG SOLR    Inject 70 mg into the vein once a week.   CETIRIZINE (ZYRTEC) 10 MG CHEWABLE TABLET     Chew 10 mg by mouth daily.   CHOLECALCIFEROL (VITAMIN D3) 25 MCG (1000 UNIT) TABLET    Take 2,000 Units by mouth daily.   DEXAMETHASONE (DECADRON) 4 MG TABLET    Take 10 tablets (40 mg total) by mouth once a week.   GABAPENTIN (NEURONTIN) 100 MG CAPSULE    TAKE 1 CAPSULE(100 MG) BY MOUTH TWICE DAILY AS NEEDED   LEVOTHYROXINE (SYNTHROID) 25 MCG TABLET    TAKE 1 TABLET(25 MCG) BY MOUTH DAILY BEFORE BREAKFAST   LIDOCAINE-PRILOCAINE (EMLA) CREAM    Apply 1 Application topically as needed.   MAGNESIUM 100 MG TABS    Take 100 mg by mouth daily.   NITROGLYCERIN (NITROSTAT) 0.4 MG SL TABLET    Place 1 tablet (0.4 mg total) under the tongue every 5 (five) minutes as needed for chest pain.   POLYETHYLENE GLYCOL (MIRALAX / GLYCOLAX) 17 G PACKET    Take 17 g by mouth daily as needed.  Modified Medications   No medications on file  Discontinued Medications   No medications on file    Allergies Allergies  Allergen Reactions   Poison Ivy Extract Swelling   Plavix [Clopidogrel Bisulfate]    Other     seasonal    Past Medical History Past Medical History:  Diagnosis Date   Asthma    Cataract    GERD (gastroesophageal reflux disease)    Hyperlipidemia    Hypertension    Thyroid disease     Past Surgical History Past Surgical History:  Procedure Laterality Date   Cataract Surgery     CORONARY ANGIOPLASTY WITH STENT PLACEMENT     EYE SURGERY     IR IMAGING GUIDED PORT INSERTION  09/09/2023   ORTHOPEDIC SURGERY  2012   TONSILLECTOMY  1951    Family History family history includes Cancer in her father; Dementia in her brother; Heart disease in her brother; Leukemia in her brother.  Social History Social History   Socioeconomic History   Marital status: Married    Spouse name: Not on file   Number of children: 0   Years of education: Not on file   Highest education level: Master's degree (e.g., MA, MS, MEng, MEd, MSW, MBA)  Occupational History   Occupation: unemployed  Tobacco  Use   Smoking status: Never   Smokeless tobacco: Never  Vaping Use   Vaping status: Never Used  Substance and Sexual Activity   Alcohol use: Yes    Alcohol/week: 1.0 standard drink of alcohol    Types: 1 Glasses of wine per week    Comment: occassionally   Drug use: No   Sexual activity: Not Currently  Other Topics Concern   Not on file  Social History Narrative   Married. Education: college. Pt does exercise-  Social Determinants of Health   Financial Resource Strain: Low Risk  (04/24/2023)   Overall Financial Resource Strain (CARDIA)    Difficulty of Paying Living Expenses: Not hard at all  Food Insecurity: No Food Insecurity (04/24/2023)   Hunger Vital Sign    Worried About Running Out of Food in the Last Year: Never true    Ran Out of Food in the Last Year: Never true  Transportation Needs: No Transportation Needs (04/24/2023)   PRAPARE - Administrator, Civil Service (Medical): No    Lack of Transportation (Non-Medical): No  Physical Activity: Insufficiently Active (04/24/2023)   Exercise Vital Sign    Days of Exercise per Week: 7 days    Minutes of Exercise per Session: 20 min  Stress: No Stress Concern Present (04/24/2023)   [employer] of Occupational Health - Occupational Stress Questionnaire    Feeling of Stress : Only a little  Social Connections: Moderately Integrated (04/24/2023)   Social Connection and Isolation Panel [NHANES]    Frequency of Communication with Friends and Family: Twice a week    Frequency of Social Gatherings with Friends and Family: Once a week    Attends Religious Services: Never    Database administrator or Organizations: Yes    Attends Banker Meetings: More than 4 times per year    Marital Status: Married  Catering manager Violence: Not At Risk (04/08/2023)   Humiliation, Afraid, Rape, and Kick questionnaire    Fear of Current or Ex-Partner: No    Emotionally Abused: No    Physically Abused: No    Sexually  Abused: No    Lab Results  Component Value Date   CHOL 151 07/09/2023   Lab Results  Component Value Date   HDL 76.40 07/09/2023   Lab Results  Component Value Date   LDLCALC 63 07/09/2023   Lab Results  Component Value Date   TRIG 56.0 07/09/2023   Lab Results  Component Value Date   CHOLHDL 2 07/09/2023   Lab Results  Component Value Date   CREATININE 0.75 09/25/2023   Lab Results  Component Value Date   GFR 70.49 01/08/2023      Component Value Date/Time   NA 139 09/25/2023 1102   NA 140 12/06/2020 1031   K 4.3 09/25/2023 1102   CL 106 09/25/2023 1102   CO2 26 09/25/2023 1102   GLUCOSE 143 (H) 09/25/2023 1102   BUN 22 09/25/2023 1102   BUN 14 12/06/2020 1031   CREATININE 0.75 09/25/2023 1102   CREATININE 0.71 08/25/2016 1048   CALCIUM 10.1 09/25/2023 1102   PROT 6.5 09/25/2023 1102   PROT 6.8 06/01/2020 1009   ALBUMIN 4.2 09/25/2023 1102   ALBUMIN 5.1 (H) 06/01/2020 1009   AST 19 09/25/2023 1102   ALT 17 09/25/2023 1102   ALKPHOS 74 09/25/2023 1102   BILITOT 0.6 09/25/2023 1102   GFRNONAA >60 09/25/2023 1102   GFRNONAA 87 08/25/2016 1048   GFRAA 85 12/06/2020 1031   GFRAA >89 08/25/2016 1048      Latest Ref Rng & Units 09/25/2023   11:02 AM 09/18/2023    7:48 AM 09/11/2023   12:41 PM  BMP  Glucose 70 - 99 mg/dL 132  97  97   BUN 8 - 23 mg/dL 22  16  14    Creatinine 0.44 - 1.00 mg/dL 4.40  1.02  7.25   Sodium 135 - 145 mmol/L 139  140  137   Potassium 3.5 -  5.1 mmol/L 4.3  4.0  3.7   Chloride 98 - 111 mmol/L 106  109  107   CO2 22 - 32 mmol/L 26  25  24    Calcium 8.9 - 10.3 mg/dL 16.1  9.6  9.7        Component Value Date/Time   WBC 9.4 09/25/2023 1102   WBC 5.7 01/21/2021 0739   RBC 3.95 09/25/2023 1102   HGB 11.9 (L) 09/25/2023 1102   HGB 12.0 12/06/2020 1031   HCT 35.6 (L) 09/25/2023 1102   HCT 36.2 12/06/2020 1031   PLT 207 09/25/2023 1102   PLT 248 12/06/2020 1031   MCV 90.1 09/25/2023 1102   MCV 89 12/06/2020 1031   MCH 30.1  09/25/2023 1102   MCHC 33.4 09/25/2023 1102   RDW 14.9 09/25/2023 1102   RDW 13.2 12/06/2020 1031   LYMPHSABS 0.4 (L) 09/25/2023 1102   LYMPHSABS 1.8 09/04/2017 1536   MONOABS 0.2 09/25/2023 1102   EOSABS 0.1 09/25/2023 1102   EOSABS 0.2 09/04/2017 1536   BASOSABS 0.1 09/25/2023 1102   BASOSABS 0.1 09/04/2017 1536   Lab Results  Component Value Date   TSH 1.63 07/09/2023   TSH 1.29 01/08/2023   TSH 1.02 07/03/2022         Parts of this note may have been dictated using voice recognition software. There may be variances in spelling and vocabulary which are unintentional. Not all errors are proofread. Please notify the Thereasa Parkin if any discrepancies are noted or if the meaning of any statement is not clear.

## 2023-10-06 ENCOUNTER — Other Ambulatory Visit: Payer: Self-pay

## 2023-10-06 DIAGNOSIS — E162 Hypoglycemia, unspecified: Secondary | ICD-10-CM

## 2023-10-06 DIAGNOSIS — E1165 Type 2 diabetes mellitus with hyperglycemia: Secondary | ICD-10-CM

## 2023-10-07 ENCOUNTER — Other Ambulatory Visit (INDEPENDENT_AMBULATORY_CARE_PROVIDER_SITE_OTHER): Payer: Medicare Other

## 2023-10-07 ENCOUNTER — Ambulatory Visit: Payer: Medicare Other

## 2023-10-07 ENCOUNTER — Encounter: Payer: Self-pay | Admitting: Hematology and Oncology

## 2023-10-07 DIAGNOSIS — E1165 Type 2 diabetes mellitus with hyperglycemia: Secondary | ICD-10-CM | POA: Diagnosis not present

## 2023-10-07 DIAGNOSIS — E162 Hypoglycemia, unspecified: Secondary | ICD-10-CM

## 2023-10-07 DIAGNOSIS — Z794 Long term (current) use of insulin: Secondary | ICD-10-CM | POA: Diagnosis not present

## 2023-10-07 LAB — CORTISOL: Cortisol, Plasma: 4.7 ug/dL

## 2023-10-08 ENCOUNTER — Inpatient Hospital Stay: Payer: Medicare Other | Admitting: Hematology and Oncology

## 2023-10-08 ENCOUNTER — Inpatient Hospital Stay: Payer: Medicare Other

## 2023-10-08 ENCOUNTER — Other Ambulatory Visit: Payer: Self-pay | Admitting: Family Medicine

## 2023-10-08 VITALS — BP 131/78 | HR 81 | Temp 98.2°F | Resp 18 | Wt 138.5 lb

## 2023-10-08 DIAGNOSIS — Z5112 Encounter for antineoplastic immunotherapy: Secondary | ICD-10-CM | POA: Diagnosis not present

## 2023-10-08 DIAGNOSIS — Z95828 Presence of other vascular implants and grafts: Secondary | ICD-10-CM | POA: Diagnosis not present

## 2023-10-08 DIAGNOSIS — Z5111 Encounter for antineoplastic chemotherapy: Secondary | ICD-10-CM

## 2023-10-08 DIAGNOSIS — Z7982 Long term (current) use of aspirin: Secondary | ICD-10-CM | POA: Diagnosis not present

## 2023-10-08 DIAGNOSIS — Z79899 Other long term (current) drug therapy: Secondary | ICD-10-CM | POA: Diagnosis not present

## 2023-10-08 DIAGNOSIS — C9000 Multiple myeloma not having achieved remission: Secondary | ICD-10-CM

## 2023-10-08 DIAGNOSIS — Z79624 Long term (current) use of inhibitors of nucleotide synthesis: Secondary | ICD-10-CM | POA: Diagnosis not present

## 2023-10-08 DIAGNOSIS — G6289 Other specified polyneuropathies: Secondary | ICD-10-CM

## 2023-10-08 LAB — CBC WITH DIFFERENTIAL (CANCER CENTER ONLY)
Abs Immature Granulocytes: 0.04 10*3/uL (ref 0.00–0.07)
Basophils Absolute: 0.1 10*3/uL (ref 0.0–0.1)
Basophils Relative: 1 %
Eosinophils Absolute: 0.2 10*3/uL (ref 0.0–0.5)
Eosinophils Relative: 3 %
HCT: 35.9 % — ABNORMAL LOW (ref 36.0–46.0)
Hemoglobin: 12.3 g/dL (ref 12.0–15.0)
Immature Granulocytes: 1 %
Lymphocytes Relative: 3 %
Lymphs Abs: 0.2 10*3/uL — ABNORMAL LOW (ref 0.7–4.0)
MCH: 30.8 pg (ref 26.0–34.0)
MCHC: 34.3 g/dL (ref 30.0–36.0)
MCV: 90 fL (ref 80.0–100.0)
Monocytes Absolute: 0.3 10*3/uL (ref 0.1–1.0)
Monocytes Relative: 4 %
Neutro Abs: 6.3 10*3/uL (ref 1.7–7.7)
Neutrophils Relative %: 88 %
Platelet Count: 303 10*3/uL (ref 150–400)
RBC: 3.99 MIL/uL (ref 3.87–5.11)
RDW: 15.5 % (ref 11.5–15.5)
WBC Count: 7.1 10*3/uL (ref 4.0–10.5)
nRBC: 0 % (ref 0.0–0.2)

## 2023-10-08 LAB — CMP (CANCER CENTER ONLY)
ALT: 16 U/L (ref 0–44)
AST: 19 U/L (ref 15–41)
Albumin: 4.4 g/dL (ref 3.5–5.0)
Alkaline Phosphatase: 70 U/L (ref 38–126)
Anion gap: 6 (ref 5–15)
BUN: 18 mg/dL (ref 8–23)
CO2: 26 mmol/L (ref 22–32)
Calcium: 10 mg/dL (ref 8.9–10.3)
Chloride: 106 mmol/L (ref 98–111)
Creatinine: 0.68 mg/dL (ref 0.44–1.00)
GFR, Estimated: >60 mL/min (ref >60)
Glucose, Bld: 104 mg/dL — ABNORMAL HIGH (ref 70–99)
Potassium: 4.1 mmol/L (ref 3.5–5.1)
Sodium: 138 mmol/L (ref 135–145)
Total Bilirubin: 0.8 mg/dL (ref <1.2)
Total Protein: 6.6 g/dL (ref 6.5–8.1)

## 2023-10-08 MED ORDER — SODIUM CHLORIDE 0.9% FLUSH
10.0000 mL | Freq: Once | INTRAVENOUS | Status: AC
  Administered 2023-10-08: 10 mL

## 2023-10-08 MED ORDER — ZOLEDRONIC ACID 4 MG/100ML IV SOLN
4.0000 mg | Freq: Once | INTRAVENOUS | Status: DC
  Filled 2023-10-08: qty 100

## 2023-10-08 MED ORDER — SODIUM CHLORIDE 0.9% FLUSH
10.0000 mL | INTRAVENOUS | Status: DC | PRN
  Administered 2023-10-08: 10 mL

## 2023-10-08 MED ORDER — HEPARIN SOD (PORK) LOCK FLUSH 100 UNIT/ML IV SOLN
500.0000 [IU] | Freq: Once | INTRAVENOUS | Status: AC | PRN
  Administered 2023-10-08: 500 [IU]

## 2023-10-08 MED ORDER — DEXTROSE 5 % IV SOLN
70.0000 mg/m2 | Freq: Once | INTRAVENOUS | Status: AC
  Administered 2023-10-08: 120 mg via INTRAVENOUS
  Filled 2023-10-08: qty 60

## 2023-10-08 MED ORDER — SODIUM CHLORIDE 0.9 % IV SOLN: Freq: Once | INTRAVENOUS | Status: AC

## 2023-10-08 NOTE — Progress Notes (Signed)
Patient took decadron at home prior to treatment today.

## 2023-10-08 NOTE — Progress Notes (Signed)
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 Concerning for multiple myeloma vs metastatic disease 12/31/2020: establish care with Dr. Leonides Schanz. UPEP showed marked Bence Jones protein, findings concerning for multiple myleoma 01/21/2021: bone marrow biopsy confirms multiple myeloma with atypical plasma cells representing 28% of all cells in the aspirate  02/01/2021: Cycle 1 Day 1 of VRd chemotherapy 02/22/2021: Cycle 2 Day 1 of VRd chemotherapy 03/15/2021: Cycle 3 Day 1 of VRd chemotherapy 04/05/2021: Cycle 4 Day 1 of VRd chemotherapy 04/26/2021: Cycle 5 Day 1 of VRd chemotherapy 05/17/2021: Cycle 6 Day 1 of VRd chemotherapy 06/07/2021:  Cycle 7 Day 1 of VRd chemotherapy 06/28/2021: Cycle 8 Day 1 of VRd chemotherapy 07/19/2021: start maintenance revlimid 10mg  PO daily.  03/26/2022: Cycle 1 Day 1 of Dara/Pom/Dex 04/24/2023: Cycle 2 Day 1 of Dara/Pom/Dex 05/22/2023: Cycle 3 Day 1 of Dara/Pom/Dex 06/19/2023: Cycle 4 Day 1 of Dara/Pom/Dex 07/17/2023: Cycle 5 Day 1 of Dara/Pom/Dex 08/28/2023: d/c Dara/Pom/Dex due to progression.  09/11/2023: Cycle 1 Day 1 of Kyprolis/Dex 10/08/2023: Cycle 2 Day 1 of Kyprolis/Dex  Interval History:  Doris Wilkerson 77 y.o. female with medical history significant for lambda light chain multiple myeloma who presents for a follow up visit. She was last seen on 09/25/2023. In the interim, she has discontinued Dara/Pom/Dex and will start Kyprolis/Dex today. She is due for Cycle 2, Day 1 today. She is accompanied by her husband for this visit.   On exam today Doris Wilkerson reports reports  that she has been well overall since her last visit.  She reports that she saw Dr. Candise Che at her last visit and many of the symptoms that she was experiencing that time have improved.  She reports that she has a little bit of physical swelling in her legs but she bought "$50 compression socks" and they been working well.  He is also been elevating them.  She notes that she was having headaches but those are now more cyclical.  She reports they are not particular bad and they mostly located in her forehead.  She did have bad fatigue with the first cycle but it does appear to be improving.  She reports that she was able to prune trees in her backyard over the weekend and was delighted that she was able to do so.  She was having a little bit of confusion but she reports the brain fog has subsequently lifted.  She notes that the port is working well and she used a lidocaine therapy today.  She is also being evaluated for cortisol levels by her endocrinologist.  She is "glad to be off the lenalidomide".  She denies easy bruising or signs of bleeding. She denies fevers, chills, night sweats, shortness of breath, chest pain or cough. She has no other complaints. A full 10 point ROS is listed below.    MEDICAL HISTORY:  Past Medical History:  Diagnosis Date   Asthma    Cataract    GERD (gastroesophageal reflux disease)    Hyperlipidemia    Hypertension    Thyroid disease     SURGICAL HISTORY: Past Surgical History:  Procedure Laterality Date   Cataract Surgery  CORONARY ANGIOPLASTY WITH STENT PLACEMENT     EYE SURGERY     IR IMAGING GUIDED PORT INSERTION  09/09/2023   ORTHOPEDIC SURGERY  2012   TONSILLECTOMY  1951    SOCIAL HISTORY: Social History   Socioeconomic History   Marital status: Married    Spouse name: Not on file   Number of children: 0   Years of education: Not on file   Highest education level: Master's degree (e.g., MA, MS, MEng, MEd, MSW, MBA)  Occupational History    Occupation: unemployed  Tobacco Use   Smoking status: Never   Smokeless tobacco: Never  Vaping Use   Vaping status: Never Used  Substance and Sexual Activity   Alcohol use: Yes    Alcohol/week: 1.0 standard drink of alcohol    Types: 1 Glasses of wine per week    Comment: occassionally   Drug use: No   Sexual activity: Not Currently  Other Topics Concern   Not on file  Social History Narrative   Married. Education: college. Pt does exercise-   Social Determinants of Health   Financial Resource Strain: Low Risk  (04/24/2023)   Overall Financial Resource Strain (CARDIA)    Difficulty of Paying Living Expenses: Not hard at all  Food Insecurity: No Food Insecurity (04/24/2023)   Hunger Vital Sign    Worried About Running Out of Food in the Last Year: Never true    Ran Out of Food in the Last Year: Never true  Transportation Needs: No Transportation Needs (04/24/2023)   PRAPARE - Administrator, Civil Service (Medical): No    Lack of Transportation (Non-Medical): No  Physical Activity: Insufficiently Active (04/24/2023)   Exercise Vital Sign    Days of Exercise per Week: 7 days    Minutes of Exercise per Session: 20 min  Stress: No Stress Concern Present (04/24/2023)   [employer] of Occupational Health - Occupational Stress Questionnaire    Feeling of Stress : Only a little  Social Connections: Moderately Integrated (04/24/2023)   Social Connection and Isolation Panel [NHANES]    Frequency of Communication with Friends and Family: Twice a week    Frequency of Social Gatherings with Friends and Family: Once a week    Attends Religious Services: Never    Database administrator or Organizations: Yes    Attends Engineer, structural: More than 4 times per year    Marital Status: Married  Catering manager Violence: Not At Risk (04/08/2023)   Humiliation, Afraid, Rape, and Kick questionnaire    Fear of Current or Ex-Partner: No    Emotionally Abused: No     Physically Abused: No    Sexually Abused: No    FAMILY HISTORY: Family History  Problem Relation Age of Onset   Cancer Father    Heart disease Brother    Dementia Brother    Leukemia Brother    Breast cancer Neg Hx    Colon cancer Neg Hx    Esophageal cancer Neg Hx    Pancreatic cancer Neg Hx    Rectal cancer Neg Hx    Stomach cancer Neg Hx     ALLERGIES:  is allergic to poison ivy extract, plavix [clopidogrel bisulfate], and other.  MEDICATIONS:  Current Outpatient Medications  Medication Sig Dispense Refill   acyclovir (ZOVIRAX) 400 MG tablet Take 1 tablet (400 mg total) by mouth 2 (two) times daily. 180 tablet 2   amLODipine (NORVASC) 5 MG tablet Take 1 tablet (5  mg total) by mouth daily. 90 tablet 1   aspirin 81 MG tablet Take 81 mg by mouth daily.      atorvastatin (LIPITOR) 80 MG tablet TAKE 1 TABLET(80 MG) BY MOUTH DAILY AT 6 PM 90 tablet 1   benazepril (LOTENSIN) 10 MG tablet TAKE 1 TABLET(10 MG) BY MOUTH DAILY 90 tablet 1   Blood Glucose Monitoring Suppl DEVI 1 each by Does not apply route as needed (symptoms of low blood sugar.). May substitute to any manufacturer covered by patient's insurance. 1 each 0   calcium carbonate (TUMS - DOSED IN MG ELEMENTAL CALCIUM) 500 MG chewable tablet Chew 1 tablet by mouth daily. Daily PRN     carfilzomib (KYPROLIS) 10 MG SOLR Inject 70 mg into the vein once a week.     cetirizine (ZYRTEC) 10 MG chewable tablet Chew 10 mg by mouth daily.     cholecalciferol (VITAMIN D3) 25 MCG (1000 UNIT) tablet Take 2,000 Units by mouth daily.     Continuous Glucose Sensor (FREESTYLE LIBRE 3 SENSOR) MISC 1 Device by Does not apply route continuous. Place 1 sensor on the skin every 14 days. Use to check glucose continuously 2 each 11   dexamethasone (DECADRON) 4 MG tablet Take 10 tablets (40 mg total) by mouth once a week. 30 tablet 5   gabapentin (NEURONTIN) 100 MG capsule TAKE 1 CAPSULE(100 MG) BY MOUTH TWICE DAILY AS NEEDED 180 capsule 1    levothyroxine (SYNTHROID) 25 MCG tablet TAKE 1 TABLET(25 MCG) BY MOUTH DAILY BEFORE BREAKFAST 90 tablet 3   lidocaine-prilocaine (EMLA) cream Apply 1 Application topically as needed. 30 g 0   Magnesium 100 MG TABS Take 100 mg by mouth daily.     nitroGLYCERIN (NITROSTAT) 0.4 MG SL tablet Place 1 tablet (0.4 mg total) under the tongue every 5 (five) minutes as needed for chest pain. 25 tablet 1   polyethylene glycol (MIRALAX / GLYCOLAX) 17 g packet Take 17 g by mouth daily as needed.     No current facility-administered medications for this visit.    REVIEW OF SYSTEMS:   Constitutional: ( - ) fevers, ( - )  chills , ( - ) night sweats Eyes: ( - ) blurriness of vision, ( - ) double vision, ( - ) watery eyes Ears, nose, mouth, throat, and face: ( - ) mucositis, ( - ) sore throat Respiratory: ( - ) cough, ( - ) dyspnea, ( - ) wheezes Cardiovascular: ( - ) palpitation, ( - ) chest discomfort, ( - ) lower extremity swelling Gastrointestinal:  ( - ) nausea, ( - ) heartburn, ( - ) change in bowel habits Skin: ( - ) abnormal skin rashes Lymphatics: ( - ) new lymphadenopathy, ( - ) easy bruising Neurological: ( +) numbness, ( - ) tingling, ( - ) new weaknesses Behavioral/Psych: ( - ) mood change, ( - ) new changes  All other systems were reviewed with the patient and are negative.  PHYSICAL EXAMINATION: ECOG PERFORMANCE STATUS: 1 - Symptomatic but completely ambulatory  There were no vitals filed for this visit.    There were no vitals filed for this visit.  GENERAL: well appearing elderly Caucasian female in NAD  SKIN: skin color, texture, turgor are normal, no rashes or significant lesions EYES: conjunctiva are pink and non-injected, sclera clear LUNGS: clear to auscultation and percussion with normal breathing effort HEART: regular rate & rhythm and no murmurs and trace lower extremity edema Musculoskeletal: no cyanosis of digits and no  clubbing  PSYCH: alert & oriented x 3, fluent  speech NEURO: no focal motor/sensory deficits  LABORATORY DATA:  I have reviewed the data as listed    Latest Ref Rng & Units 10/08/2023    8:33 AM 09/25/2023   11:02 AM 09/18/2023    7:48 AM  CBC  WBC 4.0 - 10.5 K/uL 7.1  9.4  5.2   Hemoglobin 12.0 - 15.0 g/dL 60.4  54.0  98.1   Hematocrit 36.0 - 46.0 % 35.9  35.6  34.7   Platelets 150 - 400 K/uL 303  207  287        Latest Ref Rng & Units 10/08/2023    8:33 AM 09/25/2023   11:02 AM 09/18/2023    7:48 AM  CMP  Glucose 70 - 99 mg/dL 191  478  97   BUN 8 - 23 mg/dL 18  22  16    Creatinine 0.44 - 1.00 mg/dL 2.95  6.21  3.08   Sodium 135 - 145 mmol/L 138  139  140   Potassium 3.5 - 5.1 mmol/L 4.1  4.3  4.0   Chloride 98 - 111 mmol/L 106  106  109   CO2 22 - 32 mmol/L 26  26  25    Calcium 8.9 - 10.3 mg/dL 65.7  84.6  9.6   Total Protein 6.5 - 8.1 g/dL 6.6  6.5  6.2   Total Bilirubin <1.2 mg/dL 0.8  0.6  0.6   Alkaline Phos 38 - 126 U/L 70  74  70   AST 15 - 41 U/L 19  19  15    ALT 0 - 44 U/L 16  17  15      Lab Results  Component Value Date   MPROTEIN 0.2 (H) 09/25/2023   MPROTEIN 0.2 (H) 09/11/2023   MPROTEIN 0.2 (H) 08/14/2023   Lab Results  Component Value Date   KPAFRELGTCHN 3.7 10/08/2023   KPAFRELGTCHN 4.2 09/25/2023   KPAFRELGTCHN 11.9 09/11/2023   LAMBDASER 116.7 (H) 10/08/2023   LAMBDASER 128.3 (H) 09/25/2023   LAMBDASER 230.3 (H) 09/11/2023   KAPLAMBRATIO 0.03 (L) 10/08/2023   KAPLAMBRATIO 0.03 (L) 09/25/2023   KAPLAMBRATIO 0.05 (L) 09/11/2023    RADIOGRAPHIC STUDIES: No results found.  ASSESSMENT & PLAN Doris Wilkerson is a 77 y.o. female with medical history significant for lambda light chain multiple myeloma who presents for a follow up visit. Doris Wilkerson is not a candidate for bone marrow transplant based on her advanced age.    # Lambda Light Chain Multiple Myeloma--VGPR on maintenance --diagnosis confirmed with bone marrow biopsy and urine protein analysis.   --patient has good functional status  and would be an excellent candidate for VRd chemotherapy. I do not believe she would be a bone marrow transplant candidate based on her age.  --Received VRD chemotherapy from 02/01/2021-07/12/2021 then started maintenance Revlimid on 07/19/2021.  --Due to rising lambda light chain, recommended to switch to Daratumumab/Pomalyst/Dexamethasone. --Received Dara/Pom/Dex from 03/27/2023-08/28/2023. --Due to rising lambda light chain, recommend to switch to Kyprolis/Dex 3 weeks on 1 week off, starting today. Plan: --Due to start Cycle 2, Day 1 of [place]/Dex today --Labs today show white blood cell count 7.1, hemoglobin 12.3, MCV 90, platelets 3 of 3 creatinine and LFTs are within normal limits. Myeloma labs pending today.  --Plan to obtain myeloma labs every cycle.  --Proceed with treatment today without any dose modifications.  --Return to clinic weekly for labs/treatment and follow up visit in 2 weeks  #Hypoglycemia: --Glucose  was 104 today. --Patient is asymptomatic today. Encouraged to eat right before treatment and snacks every 2-3 hours.  --Patient will check her glucose levels on non-treatment days and follow up with PCP if non-fasting glucose is consistently 70 or below.  #Back Pain/Left Hip Pain  -- Currently well controlled on gabapentin and Tylenol --CT thoracic and lumbar spine showed no lytic lesions or sequelae of multiple myeloma --MRI thoracic and lumbar spine from 07/07/2022 for baseline imaging. Findings showed multiple bone lesions involving posterior T9 and T8, lumbar spine and upper sacrum consistent with multiple myeloma. No extension into the canal. No pathologic fracture.  --continue follow up with Dr .Donalee Citrin, neurosugery --Continue to monitor  #Supportive Care --chemotherapy education complete  --port placement complete on 09/09/2023. EMLA cream sent today.  --zofran 8mg  q8H PRN and compazine 10mg  PO q6H for nausea -- acyclovir 400mg  PO BID for VCZ prophylaxis --2 year  of zometa therapy completed in July 2024.  -- no prescription pain medication required at this time.   No orders of the defined types were placed in this encounter.  All questions were answered. The patient knows to call the clinic with any problems, questions or concerns.  I have spent a total of 30 minutes minutes of face-to-face and non-face-to-face time, preparing to see the patient, performing a medically appropriate examination, counseling and educating the patient, documenting clinical information in the electronic health record.   Ulysees Barns, MD Department of Hematology/Oncology [facility] Phone: [phone] Pager: [phone] Email: [email or web address]    10/11/2023 7:00 PM

## 2023-10-08 NOTE — Patient Instructions (Signed)
 [facility] - A DEPT OF [facility]  Discharge Instructions: Thank you for choosing [facility] to provide your oncology and hematology care.   If you have a lab appointment with the Cancer Center, please go directly to the Cancer Center and check in at the registration area.   Wear comfortable clothing and clothing appropriate for easy access to any Portacath or PICC line.   We strive to give you quality time with your provider. You may need to reschedule your appointment if you arrive late (15 or more minutes).  Arriving late affects you and other patients whose appointments are after yours.  Also, if you miss three or more appointments without notifying the office, you may be dismissed from the clinic at the provider's discretion.      For prescription refill requests, have your pharmacy contact our office and allow 72 hours for refills to be completed.    Today you received the following chemotherapy and/or immunotherapy agents: Kyprolis      To help prevent nausea and vomiting after your treatment, we encourage you to take your nausea medication as directed.  BELOW ARE SYMPTOMS THAT SHOULD BE REPORTED IMMEDIATELY: *FEVER GREATER THAN 100.4 F (38 C) OR HIGHER *CHILLS OR SWEATING *NAUSEA AND VOMITING THAT IS NOT CONTROLLED WITH YOUR NAUSEA MEDICATION *UNUSUAL SHORTNESS OF BREATH *UNUSUAL BRUISING OR BLEEDING *URINARY PROBLEMS (pain or burning when urinating, or frequent urination) *BOWEL PROBLEMS (unusual diarrhea, constipation, pain near the anus) TENDERNESS IN MOUTH AND THROAT WITH OR WITHOUT PRESENCE OF ULCERS (sore throat, sores in mouth, or a toothache) UNUSUAL RASH, SWELLING OR PAIN  UNUSUAL VAGINAL DISCHARGE OR ITCHING   Items with * indicate a potential emergency and should be followed up as soon as possible or go to the Emergency Department if any problems should occur.  Please show the CHEMOTHERAPY ALERT CARD or IMMUNOTHERAPY  ALERT CARD at check-in to the Emergency Department and triage nurse.  Should you have questions after your visit or need to cancel or reschedule your appointment, please contact [facility] - A DEPT OF [facility]  Dept: [phone]  and follow the prompts.  Office hours are 8:00 a.m. to 4:30 p.m. Monday - Friday. Please note that voicemails left after 4:00 p.m. may not be returned until the following business day.  We are closed weekends and major holidays. You have access to a nurse at all times for urgent questions. Please call the main number to the clinic Dept: [phone] and follow the prompts.   For any non-urgent questions, you may also contact your provider using MyChart. We now offer e-Visits for anyone 38 and older to request care online for non-urgent symptoms. For details visit [email or web address].   Also download the MyChart app! Go to the app store, search "MyChart", open the app, select [place], and log in with your MyChart username and password.

## 2023-10-09 LAB — KAPPA/LAMBDA LIGHT CHAINS
Kappa free light chain: 3.7 mg/L (ref 3.3–19.4)
Kappa, lambda light chain ratio: 0.03 — ABNORMAL LOW (ref 0.26–1.65)
Lambda free light chains: 116.7 mg/L — ABNORMAL HIGH (ref 5.7–26.3)

## 2023-10-11 ENCOUNTER — Encounter: Payer: Self-pay | Admitting: Hematology and Oncology

## 2023-10-12 LAB — MULTIPLE MYELOMA PANEL, SERUM
Albumin SerPl Elph-Mcnc: 3.9 g/dL (ref 2.9–4.4)
Albumin/Glob SerPl: 1.9 — ABNORMAL HIGH (ref 0.7–1.7)
Alpha 1: 0.2 g/dL (ref 0.0–0.4)
Alpha2 Glob SerPl Elph-Mcnc: 0.7 g/dL (ref 0.4–1.0)
B-Globulin SerPl Elph-Mcnc: 0.9 g/dL (ref 0.7–1.3)
Gamma Glob SerPl Elph-Mcnc: 0.3 g/dL — ABNORMAL LOW (ref 0.4–1.8)
Globulin, Total: 2.1 g/dL — ABNORMAL LOW (ref 2.2–3.9)
IgA: 27 mg/dL — ABNORMAL LOW (ref 64–422)
IgG (Immunoglobin G), Serum: 383 mg/dL — ABNORMAL LOW (ref 586–1602)
IgM (Immunoglobulin M), Srm: 7 mg/dL — ABNORMAL LOW (ref 26–217)
M Protein SerPl Elph-Mcnc: 0.1 g/dL — ABNORMAL HIGH
Total Protein ELP: 6 g/dL (ref 6.0–8.5)

## 2023-10-12 LAB — DHEA-SULFATE: DHEA-SO4: 17 ug/dL (ref 4–157)

## 2023-10-12 LAB — ACTH: C206 ACTH: 10 pg/mL (ref 6–50)

## 2023-10-16 ENCOUNTER — Inpatient Hospital Stay: Payer: Medicare Other

## 2023-10-16 VITALS — BP 129/63 | HR 76 | Temp 98.3°F | Resp 18 | Ht 66.0 in | Wt 137.1 lb

## 2023-10-16 DIAGNOSIS — Z7982 Long term (current) use of aspirin: Secondary | ICD-10-CM | POA: Diagnosis not present

## 2023-10-16 DIAGNOSIS — C9000 Multiple myeloma not having achieved remission: Secondary | ICD-10-CM | POA: Diagnosis not present

## 2023-10-16 DIAGNOSIS — Z79624 Long term (current) use of inhibitors of nucleotide synthesis: Secondary | ICD-10-CM | POA: Diagnosis not present

## 2023-10-16 DIAGNOSIS — Z79899 Other long term (current) drug therapy: Secondary | ICD-10-CM | POA: Diagnosis not present

## 2023-10-16 DIAGNOSIS — Z5112 Encounter for antineoplastic immunotherapy: Secondary | ICD-10-CM | POA: Diagnosis not present

## 2023-10-16 LAB — CMP (CANCER CENTER ONLY)
ALT: 16 U/L (ref 0–44)
AST: 19 U/L (ref 15–41)
Albumin: 4.1 g/dL (ref 3.5–5.0)
Alkaline Phosphatase: 59 U/L (ref 38–126)
Anion gap: 5 (ref 5–15)
BUN: 19 mg/dL (ref 8–23)
CO2: 26 mmol/L (ref 22–32)
Calcium: 9.8 mg/dL (ref 8.9–10.3)
Chloride: 107 mmol/L (ref 98–111)
Creatinine: 0.77 mg/dL (ref 0.44–1.00)
GFR, Estimated: >60 mL/min (ref >60)
Glucose, Bld: 102 mg/dL — ABNORMAL HIGH (ref 70–99)
Potassium: 4 mmol/L (ref 3.5–5.1)
Sodium: 138 mmol/L (ref 135–145)
Total Bilirubin: 0.9 mg/dL (ref <1.2)
Total Protein: 6 g/dL — ABNORMAL LOW (ref 6.5–8.1)

## 2023-10-16 LAB — CBC WITH DIFFERENTIAL (CANCER CENTER ONLY)
Abs Immature Granulocytes: 0.01 10*3/uL (ref 0.00–0.07)
Basophils Absolute: 0 10*3/uL (ref 0.0–0.1)
Basophils Relative: 1 %
Eosinophils Absolute: 0.2 10*3/uL (ref 0.0–0.5)
Eosinophils Relative: 4 %
HCT: 35 % — ABNORMAL LOW (ref 36.0–46.0)
Hemoglobin: 11.6 g/dL — ABNORMAL LOW (ref 12.0–15.0)
Immature Granulocytes: 0 %
Lymphocytes Relative: 7 %
Lymphs Abs: 0.3 10*3/uL — ABNORMAL LOW (ref 0.7–4.0)
MCH: 30.6 pg (ref 26.0–34.0)
MCHC: 33.1 g/dL (ref 30.0–36.0)
MCV: 92.3 fL (ref 80.0–100.0)
Monocytes Absolute: 0.3 10*3/uL (ref 0.1–1.0)
Monocytes Relative: 7 %
Neutro Abs: 3.4 10*3/uL (ref 1.7–7.7)
Neutrophils Relative %: 81 %
Platelet Count: 148 10*3/uL — ABNORMAL LOW (ref 150–400)
RBC: 3.79 MIL/uL — ABNORMAL LOW (ref 3.87–5.11)
RDW: 15.9 % — ABNORMAL HIGH (ref 11.5–15.5)
WBC Count: 4.3 10*3/uL (ref 4.0–10.5)
nRBC: 0 % (ref 0.0–0.2)

## 2023-10-16 MED ORDER — SODIUM CHLORIDE 0.9% FLUSH
10.0000 mL | INTRAVENOUS | Status: DC | PRN
  Administered 2023-10-16: 10 mL

## 2023-10-16 MED ORDER — HEPARIN SOD (PORK) LOCK FLUSH 100 UNIT/ML IV SOLN
500.0000 [IU] | Freq: Once | INTRAVENOUS | Status: AC | PRN
  Administered 2023-10-16: 500 [IU]

## 2023-10-16 MED ORDER — SODIUM CHLORIDE 0.9 % IV SOLN: Freq: Once | INTRAVENOUS | Status: AC

## 2023-10-16 MED ORDER — DEXTROSE 5 % IV SOLN
70.0000 mg/m2 | Freq: Once | INTRAVENOUS | Status: AC
  Administered 2023-10-16: 120 mg via INTRAVENOUS
  Filled 2023-10-16: qty 60

## 2023-10-16 NOTE — Progress Notes (Signed)
Patient took dexamethasone at 0600.

## 2023-10-16 NOTE — Patient Instructions (Signed)
[place] [facility] - A DEPT OF [facility]  Discharge Instructions: Thank you for choosing [facility] to provide your oncology and hematology care.   If you have a lab appointment with the Cancer Center, please go directly to the Cancer Center and check in at the registration area.   Wear comfortable clothing and clothing appropriate for easy access to any Portacath or PICC line.   We strive to give you quality time with your provider. You may need to reschedule your appointment if you arrive late (15 or more minutes).  Arriving late affects you and other patients whose appointments are after yours.  Also, if you miss three or more appointments without notifying the office, you may be dismissed from the clinic at the provider's discretion.      For prescription refill requests, have your pharmacy contact our office and allow 72 hours for refills to be completed.    Today you received the following chemotherapy and/or immunotherapy agents: Kyprolis      To help prevent nausea and vomiting after your treatment, we encourage you to take your nausea medication as directed.  BELOW ARE SYMPTOMS THAT SHOULD BE REPORTED IMMEDIATELY: *FEVER GREATER THAN 100.4 F (38 C) OR HIGHER *CHILLS OR SWEATING *NAUSEA AND VOMITING THAT IS NOT CONTROLLED WITH YOUR NAUSEA MEDICATION *UNUSUAL SHORTNESS OF BREATH *UNUSUAL BRUISING OR BLEEDING *URINARY PROBLEMS (pain or burning when urinating, or frequent urination) *BOWEL PROBLEMS (unusual diarrhea, constipation, pain near the anus) TENDERNESS IN MOUTH AND THROAT WITH OR WITHOUT PRESENCE OF ULCERS (sore throat, sores in mouth, or a toothache) UNUSUAL RASH, SWELLING OR PAIN  UNUSUAL VAGINAL DISCHARGE OR ITCHING   Items with * indicate a potential emergency and should be followed up as soon as possible or go to the Emergency Department if any problems should occur.  Please show the CHEMOTHERAPY ALERT CARD or IMMUNOTHERAPY  ALERT CARD at check-in to the Emergency Department and triage nurse.  Should you have questions after your visit or need to cancel or reschedule your appointment, please contact [facility] [facility] - A DEPT OF [facility]  Dept: [phone]  and follow the prompts.  Office hours are 8:00 a.m. to 4:30 p.m. Monday - Friday. Please note that voicemails left after 4:00 p.m. may not be returned until the following business day.  We are closed weekends and major holidays. You have access to a nurse at all times for urgent questions. Please call the main number to the clinic Dept: [phone] and follow the prompts.   For any non-urgent questions, you may also contact your provider using MyChart. We now offer e-Visits for anyone 38 and older to request care online for non-urgent symptoms. For details visit [email or web address].   Also download the MyChart app! Go to the app store, search "MyChart", open the app, select [place], and log in with your MyChart username and password.

## 2023-10-17 ENCOUNTER — Encounter: Payer: Self-pay | Admitting: "Endocrinology

## 2023-10-19 ENCOUNTER — Other Ambulatory Visit: Payer: Self-pay | Admitting: "Endocrinology

## 2023-10-22 NOTE — Progress Notes (Signed)
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   --patient has good functional status and would be an excellent candidate for VRd chemotherapy. I do not believe she would be a bone marrow transplant candidate based on her age.  --Received VRD chemotherapy from 02/01/2021-07/12/2021 then started maintenance Revlimid on 07/19/2021.  --Due to rising lambda light chain, recommended to switch to Daratumumab/Pomalyst/Dexamethasone. --Received Dara/Pom/Dex from 03/27/2023-08/28/2023. --Due to rising lambda light chain, recommend to switch to Kyprolis/Dex 3 weeks on 1 week off, starting today. Plan: --Due to start Cycle 2, Day 15 of [place]/Dex today --Labs today show white blood cell count 8.7, hemoglobin 11.9, MCV 93, platelets 165 creatinine and LFTs are within normal limits. Myeloma labs pending today.  --Proceed with treatment today without any dose modifications.  --Return to clinic weekly for labs/treatment and follow up visit in 2 weeks   Plan: Labs reviewed  -CBC showing WBC 8.7; Hgb 11.9; Hct 35.9; Plt 165; Anc 8.1 -CMP - K 4.2; glucose 95; BUN 21; Creatinine 0.68; eGFR > 60; Ca 9.7; LFTs normal.   Clinically patient is doing well, better with this second cycle. Labs adequate for treatment.  Proceed with cycle 2 day 15. Labs/flush, follow up, and treatment as scheduled.   The patient understands the plans discussed today and is in agreement with them.  She knows to contact our office if she develops concerns prior to her next appointment.  I provided 20 minutes of face-to-face  time during this encounter and > 50% was spent counseling as documented under my assessment and plan.    Carlean Jews, NP  [facility] - A DEPT OF [facility] [address] [place] [address] Dept: [phone] Dept Fax: [phone]   No orders of the defined types were placed in this encounter.     CHIEF COMPLAINT:  CC: multiple Myeloma   Current Treatment:  Kyprolis/Dex weekly for 3 weeks then 1 week off.   INTERVAL HISTORY:  Doris Wilkerson is here today for repeat clinical assessment. She last saw Dr. Leonides Schanz on 10/08/2023. Started new chemotherapy regimen, Kyrolis/dexamethazone every week. Has been tolerating well overall.  States second cycle has been better than first cycle.  She does state fatigue is the biggest negative side effect.  She has seen endocrinologist recently.  Cortisol levels are low.  Unsure if this was related to myeloma, pituitary, steroid prescription.  Has follow-up with endocrinology in near future. She denies chest pain, chest pressure, or shortness of breath. She denies headaches or visual disturbances. She denies abdominal pain, nausea, vomiting, or changes in bowel or bladder habits.   Her appetite is good. Her weight has decreased 2 pounds over last 2 weeks .  I have reviewed the past medical history, past surgical history, social history and family history with the patient and they are unchanged from previous note.  ALLERGIES:  is allergic to poison ivy extract, plavix [clopidogrel bisulfate], and other.  MEDICATIONS:  Current Outpatient Medications  Medication Sig Dispense Refill   acyclovir (ZOVIRAX) 400 MG tablet Take 1 tablet (400 mg total) by mouth 2 (  two) times daily. 180 tablet 2   amLODipine (NORVASC) 5 MG tablet Take 1 tablet (5 mg total) by mouth daily. 90 tablet 1   aspirin 81 MG tablet Take 81 mg by mouth daily.      atorvastatin (LIPITOR) 80 MG tablet TAKE 1 TABLET(80 MG) BY  MOUTH DAILY AT 6 PM 90 tablet 1   benazepril (LOTENSIN) 10 MG tablet TAKE 1 TABLET(10 MG) BY MOUTH DAILY 90 tablet 1   Blood Glucose Monitoring Suppl DEVI 1 each by Does not apply route as needed (symptoms of low blood sugar.). May substitute to any manufacturer covered by patient's insurance. 1 each 0   calcium carbonate (TUMS - DOSED IN MG ELEMENTAL CALCIUM) 500 MG chewable tablet Chew 1 tablet by mouth daily. Daily PRN     carfilzomib (KYPROLIS) 10 MG SOLR Inject 70 mg into the vein once a week.     cholecalciferol (VITAMIN D3) 25 MCG (1000 UNIT) tablet Take 2,000 Units by mouth daily.     Continuous Glucose Sensor (FREESTYLE LIBRE 3 SENSOR) MISC 1 Device by Does not apply route continuous. Place 1 sensor on the skin every 14 days. Use to check glucose continuously 2 each 11   dexamethasone (DECADRON) 4 MG tablet Take 10 tablets (40 mg total) by mouth once a week. 30 tablet 5   gabapentin (NEURONTIN) 100 MG capsule TAKE 1 CAPSULE(100 MG) BY MOUTH TWICE DAILY AS NEEDED 180 capsule 1   levothyroxine (SYNTHROID) 25 MCG tablet TAKE 1 TABLET(25 MCG) BY MOUTH DAILY BEFORE BREAKFAST 90 tablet 3   lidocaine-prilocaine (EMLA) cream Apply 1 Application topically as needed. 30 g 0   Magnesium 100 MG TABS Take 100 mg by mouth daily.     polyethylene glycol (MIRALAX / GLYCOLAX) 17 g packet Take 17 g by mouth daily as needed.     nitroGLYCERIN (NITROSTAT) 0.4 MG SL tablet Place 1 tablet (0.4 mg total) under the tongue every 5 (five) minutes as needed for chest pain. 25 tablet 1   No current facility-administered medications for this visit.   Facility-Administered Medications Ordered in Other Visits  Medication Dose Route Frequency Provider Last Rate Last Admin   0.9 %  sodium chloride infusion   Intravenous Continuous Candise Che, Corene Cornea, MD       sodium chloride flush (NS) 0.9 % injection 10 mL  10 mL Intracatheter PRN Johney Maine, MD   10 mL at 10/23/23 1137    HISTORY OF PRESENT ILLNESS:    Oncology History Overview Note  Lambda light chain disease FISH showed del 13q, t(4;14) and dup1q   Multiple myeloma not having achieved remission (HCC)  12/26/2020 Imaging   MRI pelvis  Bones: 1.5 cm rounded T2 hyperintense T1 hypointense bone lesion in the left sacral alar. Similar smaller 8 mm bone lesion in the right sacral alar. Similar smaller bone lesion in the L4 and L5 vertebral bodies. Long segment marrow edema within the left inferior pubic ramus and ischial tuberosity, with a similar larger lesion in the atrial tuberosity measuring 4.1 x 2.9 cm.. No lesions in the proximal femora.  . Joints: Preserved without evidence of effusion or subluxation.  . Soft tissues: No soft tissue mass or fluid collection.  . Labrum: Intact  . Acetabular cartilage: Preserved  . Piriformis muscles and sciatic nerves: Symmetric.  Marland Kitchen Lower lumbosacral spine: Intact  . Tendons:Gluteus, iliopsoas, and hamstrings tendons intact.   01/11/2021 Imaging   1. Generalized osteopenia with no focal destructive or suspicious bone lesion  identified radiographically. 2. Disc and endplate degeneration in the cervical spine, lumbar spine. Degenerative spondylolisthesis at L2-L3.   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 The plasma cells display lambda light chain restriction consistent with plasma cell neoplasm.  Correlation with cytogenetic and FISH studies is  recommended.    01/27/2021 Initial Diagnosis   Multiple myeloma not having achieved remission (HCC)   02/01/2021 -  07/12/2021 Chemotherapy   Patient is on Treatment Plan : MYELOMA NON-TRANSPLANT CANDIDATES VRd weekly q21d     03/21/2021 Cancer Staging   Staging form: Plasma Cell Myeloma and Plasma Cell Disorders, AJCC 8th Edition - Clinical stage from 03/21/2021: Beta-2-microglobulin (mg/L): 2.2, Albumin (g/dL): 4.5, ISS: Stage I - Signed by Artis Delay, MD on 03/21/2021 Beta 2 microglobulin range (mg/L): Less than 3.5 Albumin range (g/dL): Greater than or equal to 3.5 Serum calcium level: Elevated Pre-operative calcium level (mg/dL): 16.1 Serum creatinine level: Normal Creatinine (mg/dL): 0.8   0/07/6044 - 40/07/8118 Chemotherapy   Patient is on Treatment Plan : MYELOMA RELAPSED REFRACTORY Daratumumab SQ + Pomalidomide + Dexamethasone (DaraPd) q28d     09/11/2023 -  Chemotherapy   Patient is on Treatment Plan : MYELOMA RELAPSED/REFRACTORY Carfilzomib (20/70) D1,8,15 + Dexamethasone weekly (40) (Kd) q28d  x 9 cycles / Dexamethasone D1,8,15         REVIEW OF SYSTEMS:   Constitutional: Denies fevers, chills or abnormal weight loss. Moderate fatigue  Eyes: Denies blurriness of vision Ears, nose, mouth, throat, and face: Denies mucositis or sore throat Respiratory: Denies cough, dyspnea or wheezes Cardiovascular: Denies palpitation, chest discomfort or lower extremity swelling Gastrointestinal:  Denies nausea, heartburn or change in bowel habits Skin: Denies abnormal skin rashes Lymphatics: Denies new lymphadenopathy or easy bruising Neurological:Denies numbness, tingling or new weaknesses Behavioral/Psych: Mood is stable, no new changes  All other systems were reviewed with the patient and are negative.   VITALS:   Today's Vitals   10/23/23 0856 10/23/23 0903  BP: 121/65   Pulse: 82   Resp: 16   Temp: 98.1 F (36.7 C)   TempSrc: Temporal   SpO2: 100%   Weight: 137 lb 14.4 oz (62.6 kg)   Height: 5\' 6"  (1.676 m)   PainSc:  0-No pain   Body mass index is 22.26 kg/m.   Wt Readings from  Last 3 Encounters:  10/23/23 137 lb 14.4 oz (62.6 kg)  10/16/23 137 lb 1.9 oz (62.2 kg)  10/08/23 138 lb 8 oz (62.8 kg)    Body mass index is 22.26 kg/m.  Performance status (ECOG): 1 - Symptomatic but completely ambulatory  PHYSICAL EXAM:   GENERAL:alert, no distress and comfortable SKIN: skin color, texture, turgor are normal, no rashes or significant lesions EYES: normal, Conjunctiva are pink and non-injected, sclera clear OROPHARYNX:no exudate, no erythema and lips, buccal mucosa, and tongue normal  NECK: supple, thyroid normal size, non-tender, without nodularity LYMPH:  no palpable lymphadenopathy in the cervical, axillary or inguinal LUNGS: clear to auscultation and percussion with normal breathing effort HEART: regular rate & rhythm and no murmurs and no lower extremity edema ABDOMEN:abdomen soft, non-tender and  normal bowel sounds Musculoskeletal:no cyanosis of digits and no clubbing  NEURO: alert & oriented x 3 with fluent speech, no focal motor/sensory deficits  LABORATORY DATA:  I have reviewed the data as listed    Component Value Date/Time   NA 138 10/23/2023 0829   NA 140 12/06/2020 1031   K 4.2 10/23/2023 0829   CL 107 10/23/2023 0829   CO2 25 10/23/2023 0829   GLUCOSE 95 10/23/2023 0829   BUN 21 10/23/2023 0829   BUN 14 12/06/2020 1031   CREATININE 0.68 10/23/2023 0829   CREATININE 0.71 08/25/2016 1048   CALCIUM 9.7 10/23/2023 0829   PROT 6.1 (L) 10/23/2023 0829   PROT 6.8 06/01/2020 1009   ALBUMIN 4.1 10/23/2023 0829   ALBUMIN 5.1 (H) 06/01/2020 1009   AST 16 10/23/2023 0829   ALT 15 10/23/2023 0829   ALKPHOS 58 10/23/2023 0829   BILITOT 0.9 10/23/2023 0829   GFRNONAA >60 10/23/2023 0829   GFRNONAA 87 08/25/2016 1048   GFRAA 85 12/06/2020 1031   GFRAA >89 08/25/2016 1048     Lab Results  Component Value Date   WBC 8.7 10/23/2023   NEUTROABS 8.1 (H) 10/23/2023   HGB 11.9 (L) 10/23/2023   HCT 35.9 (L) 10/23/2023   MCV 93.0 10/23/2023   PLT  165 10/23/2023

## 2023-10-22 NOTE — Assessment & Plan Note (Addendum)
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   --patient has good functional status and would be an excellent candidate for VRd chemotherapy. I do not believe she would be a bone marrow transplant candidate based on her age.  --Received VRD chemotherapy from 02/01/2021-07/12/2021 then started maintenance Revlimid on 07/19/2021.  --Due to rising lambda light chain, recommended to switch to Daratumumab/Pomalyst/Dexamethasone. --Received Dara/Pom/Dex from 03/27/2023-08/28/2023. --Due to rising lambda light chain, recommend to switch to Kyprolis/Dex 3 weeks on 1 week off, starting today. Plan: --Due to start Cycle 2, Day 15 of [place]/Dex today --Labs today show white blood cell count 8.7, hemoglobin 11.9, MCV 93, platelets 165 creatinine and LFTs are within normal limits. Myeloma labs pending today.  --Proceed with treatment today without any dose modifications.  --Return to clinic weekly for labs/treatment and follow up visit in 2 weeks

## 2023-10-23 ENCOUNTER — Inpatient Hospital Stay: Payer: Medicare Other

## 2023-10-23 ENCOUNTER — Other Ambulatory Visit: Payer: Medicare Other

## 2023-10-23 ENCOUNTER — Ambulatory Visit: Payer: Medicare Other

## 2023-10-23 ENCOUNTER — Inpatient Hospital Stay (HOSPITAL_BASED_OUTPATIENT_CLINIC_OR_DEPARTMENT_OTHER): Payer: Medicare Other | Admitting: Nurse Practitioner

## 2023-10-23 ENCOUNTER — Encounter: Payer: Self-pay | Admitting: Nurse Practitioner

## 2023-10-23 ENCOUNTER — Ambulatory Visit: Payer: Medicare Other | Admitting: Nurse Practitioner

## 2023-10-23 VITALS — BP 121/65 | HR 82 | Temp 98.1°F | Resp 16 | Ht 66.0 in | Wt 137.9 lb

## 2023-10-23 DIAGNOSIS — C9000 Multiple myeloma not having achieved remission: Secondary | ICD-10-CM

## 2023-10-23 DIAGNOSIS — Z5112 Encounter for antineoplastic immunotherapy: Secondary | ICD-10-CM | POA: Diagnosis not present

## 2023-10-23 DIAGNOSIS — Z79899 Other long term (current) drug therapy: Secondary | ICD-10-CM | POA: Diagnosis not present

## 2023-10-23 DIAGNOSIS — Z7982 Long term (current) use of aspirin: Secondary | ICD-10-CM | POA: Diagnosis not present

## 2023-10-23 DIAGNOSIS — Z79624 Long term (current) use of inhibitors of nucleotide synthesis: Secondary | ICD-10-CM | POA: Diagnosis not present

## 2023-10-23 LAB — CBC WITH DIFFERENTIAL (CANCER CENTER ONLY)
Abs Immature Granulocytes: 0.03 10*3/uL (ref 0.00–0.07)
Basophils Absolute: 0 10*3/uL (ref 0.0–0.1)
Basophils Relative: 0 %
Eosinophils Absolute: 0.1 10*3/uL (ref 0.0–0.5)
Eosinophils Relative: 1 %
HCT: 35.9 % — ABNORMAL LOW (ref 36.0–46.0)
Hemoglobin: 11.9 g/dL — ABNORMAL LOW (ref 12.0–15.0)
Immature Granulocytes: 0 %
Lymphocytes Relative: 2 %
Lymphs Abs: 0.2 10*3/uL — ABNORMAL LOW (ref 0.7–4.0)
MCH: 30.8 pg (ref 26.0–34.0)
MCHC: 33.1 g/dL (ref 30.0–36.0)
MCV: 93 fL (ref 80.0–100.0)
Monocytes Absolute: 0.3 10*3/uL (ref 0.1–1.0)
Monocytes Relative: 3 %
Neutro Abs: 8.1 10*3/uL — ABNORMAL HIGH (ref 1.7–7.7)
Neutrophils Relative %: 94 %
Platelet Count: 165 10*3/uL (ref 150–400)
RBC: 3.86 MIL/uL — ABNORMAL LOW (ref 3.87–5.11)
RDW: 16.4 % — ABNORMAL HIGH (ref 11.5–15.5)
WBC Count: 8.7 10*3/uL (ref 4.0–10.5)
nRBC: 0 % (ref 0.0–0.2)

## 2023-10-23 LAB — CMP (CANCER CENTER ONLY)
ALT: 15 U/L (ref 0–44)
AST: 16 U/L (ref 15–41)
Albumin: 4.1 g/dL (ref 3.5–5.0)
Alkaline Phosphatase: 58 U/L (ref 38–126)
Anion gap: 6 (ref 5–15)
BUN: 21 mg/dL (ref 8–23)
CO2: 25 mmol/L (ref 22–32)
Calcium: 9.7 mg/dL (ref 8.9–10.3)
Chloride: 107 mmol/L (ref 98–111)
Creatinine: 0.68 mg/dL (ref 0.44–1.00)
GFR, Estimated: >60 mL/min (ref >60)
Glucose, Bld: 95 mg/dL (ref 70–99)
Potassium: 4.2 mmol/L (ref 3.5–5.1)
Sodium: 138 mmol/L (ref 135–145)
Total Bilirubin: 0.9 mg/dL (ref <1.2)
Total Protein: 6.1 g/dL — ABNORMAL LOW (ref 6.5–8.1)

## 2023-10-23 MED ORDER — HEPARIN SOD (PORK) LOCK FLUSH 100 UNIT/ML IV SOLN
500.0000 [IU] | Freq: Once | INTRAVENOUS | Status: AC | PRN
  Administered 2023-10-23: 500 [IU]

## 2023-10-23 MED ORDER — SODIUM CHLORIDE 0.9% FLUSH
10.0000 mL | Freq: Once | INTRAVENOUS | Status: AC
  Administered 2023-10-23: 10 mL via INTRAVENOUS

## 2023-10-23 MED ORDER — SODIUM CHLORIDE 0.9 % IV SOLN: INTRAVENOUS | Status: DC

## 2023-10-23 MED ORDER — SODIUM CHLORIDE 0.9 % IV SOLN: Freq: Once | INTRAVENOUS | Status: AC

## 2023-10-23 MED ORDER — DEXTROSE 5 % IV SOLN
70.0000 mg/m2 | Freq: Once | INTRAVENOUS | Status: AC
  Administered 2023-10-23: 120 mg via INTRAVENOUS
  Filled 2023-10-23: qty 60

## 2023-10-23 MED ORDER — SODIUM CHLORIDE 0.9% FLUSH
10.0000 mL | INTRAVENOUS | Status: DC | PRN
  Administered 2023-10-23: 10 mL

## 2023-10-23 NOTE — Progress Notes (Signed)
Pt took decadron at home prior to arriving for infusion.

## 2023-10-23 NOTE — Patient Instructions (Signed)

## 2023-10-24 ENCOUNTER — Encounter: Payer: Self-pay | Admitting: Nurse Practitioner

## 2023-10-24 ENCOUNTER — Encounter: Payer: Self-pay | Admitting: Hematology and Oncology

## 2023-10-24 LAB — KAPPA/LAMBDA LIGHT CHAINS
Kappa free light chain: 4.9 mg/L (ref 3.3–19.4)
Kappa, lambda light chain ratio: 0.11 — ABNORMAL LOW (ref 0.26–1.65)
Lambda free light chains: 45.7 mg/L — ABNORMAL HIGH (ref 5.7–26.3)

## 2023-10-27 LAB — MULTIPLE MYELOMA PANEL, SERUM
Albumin SerPl Elph-Mcnc: 3.8 g/dL (ref 2.9–4.4)
Albumin/Glob SerPl: 2 — ABNORMAL HIGH (ref 0.7–1.7)
Alpha 1: 0.2 g/dL (ref 0.0–0.4)
Alpha2 Glob SerPl Elph-Mcnc: 0.7 g/dL (ref 0.4–1.0)
B-Globulin SerPl Elph-Mcnc: 0.9 g/dL (ref 0.7–1.3)
Gamma Glob SerPl Elph-Mcnc: 0.3 g/dL — ABNORMAL LOW (ref 0.4–1.8)
Globulin, Total: 2 g/dL — ABNORMAL LOW (ref 2.2–3.9)
IgA: 20 mg/dL — ABNORMAL LOW (ref 64–422)
IgG (Immunoglobin G), Serum: 332 mg/dL — ABNORMAL LOW (ref 586–1602)
IgM (Immunoglobulin M), Srm: 6 mg/dL — ABNORMAL LOW (ref 26–217)
M Protein SerPl Elph-Mcnc: 0.1 g/dL — ABNORMAL HIGH
Total Protein ELP: 5.8 g/dL — ABNORMAL LOW (ref 6.0–8.5)

## 2023-11-02 ENCOUNTER — Ambulatory Visit (HOSPITAL_COMMUNITY)
Admission: RE | Admit: 2023-11-02 | Discharge: 2023-11-02 | Disposition: A | Discharge status: Home / Self Care | Payer: Medicare Other | Source: Ambulatory Visit | Attending: Physician Assistant | Admitting: Physician Assistant

## 2023-11-02 ENCOUNTER — Telehealth: Payer: Self-pay | Admitting: *Deleted

## 2023-11-02 ENCOUNTER — Other Ambulatory Visit: Payer: Self-pay | Admitting: Surgery

## 2023-11-02 ENCOUNTER — Inpatient Hospital Stay: Payer: Medicare Other | Attending: Physician Assistant | Admitting: Physician Assistant

## 2023-11-02 ENCOUNTER — Telehealth: Payer: Self-pay | Admitting: Surgery

## 2023-11-02 VITALS — BP 103/66 | HR 96 | Temp 97.9°F | Resp 16 | Wt 141.6 lb

## 2023-11-02 DIAGNOSIS — E162 Hypoglycemia, unspecified: Secondary | ICD-10-CM | POA: Insufficient documentation

## 2023-11-02 DIAGNOSIS — R509 Fever, unspecified: Secondary | ICD-10-CM | POA: Diagnosis not present

## 2023-11-02 DIAGNOSIS — Z79624 Long term (current) use of inhibitors of nucleotide synthesis: Secondary | ICD-10-CM | POA: Diagnosis not present

## 2023-11-02 DIAGNOSIS — Z79899 Other long term (current) drug therapy: Secondary | ICD-10-CM | POA: Diagnosis not present

## 2023-11-02 DIAGNOSIS — C9000 Multiple myeloma not having achieved remission: Secondary | ICD-10-CM | POA: Insufficient documentation

## 2023-11-02 DIAGNOSIS — Z7982 Long term (current) use of aspirin: Secondary | ICD-10-CM | POA: Insufficient documentation

## 2023-11-02 DIAGNOSIS — Z5112 Encounter for antineoplastic immunotherapy: Secondary | ICD-10-CM | POA: Insufficient documentation

## 2023-11-02 DIAGNOSIS — Z95828 Presence of other vascular implants and grafts: Secondary | ICD-10-CM | POA: Diagnosis not present

## 2023-11-02 DIAGNOSIS — Z7961 Long term (current) use of immunomodulator: Secondary | ICD-10-CM | POA: Insufficient documentation

## 2023-11-02 LAB — CBC WITH DIFFERENTIAL (CANCER CENTER ONLY)
Abs Immature Granulocytes: 0.02 10*3/uL (ref 0.00–0.07)
Basophils Absolute: 0 10*3/uL (ref 0.0–0.1)
Basophils Relative: 0 %
Eosinophils Absolute: 0.3 10*3/uL (ref 0.0–0.5)
Eosinophils Relative: 4 %
HCT: 35.6 % — ABNORMAL LOW (ref 36.0–46.0)
Hemoglobin: 12 g/dL (ref 12.0–15.0)
Immature Granulocytes: 0 %
Lymphocytes Relative: 3 %
Lymphs Abs: 0.2 10*3/uL — ABNORMAL LOW (ref 0.7–4.0)
MCH: 31.8 pg (ref 26.0–34.0)
MCHC: 33.7 g/dL (ref 30.0–36.0)
MCV: 94.4 fL (ref 80.0–100.0)
Monocytes Absolute: 0.4 10*3/uL (ref 0.1–1.0)
Monocytes Relative: 7 %
Neutro Abs: 5.5 10*3/uL (ref 1.7–7.7)
Neutrophils Relative %: 86 %
Platelet Count: 272 10*3/uL (ref 150–400)
RBC: 3.77 MIL/uL — ABNORMAL LOW (ref 3.87–5.11)
RDW: 16.5 % — ABNORMAL HIGH (ref 11.5–15.5)
WBC Count: 6.4 10*3/uL (ref 4.0–10.5)
nRBC: 0 % (ref 0.0–0.2)

## 2023-11-02 LAB — CMP (CANCER CENTER ONLY)
ALT: 13 U/L (ref 0–44)
AST: 16 U/L (ref 15–41)
Albumin: 4.1 g/dL (ref 3.5–5.0)
Alkaline Phosphatase: 57 U/L (ref 38–126)
Anion gap: 7 (ref 5–15)
BUN: 16 mg/dL (ref 8–23)
CO2: 25 mmol/L (ref 22–32)
Calcium: 9.7 mg/dL (ref 8.9–10.3)
Chloride: 105 mmol/L (ref 98–111)
Creatinine: 0.75 mg/dL (ref 0.44–1.00)
GFR, Estimated: >60 mL/min (ref >60)
Glucose, Bld: 106 mg/dL — ABNORMAL HIGH (ref 70–99)
Potassium: 3.6 mmol/L (ref 3.5–5.1)
Sodium: 137 mmol/L (ref 135–145)
Total Bilirubin: 0.7 mg/dL (ref <1.2)
Total Protein: 6.2 g/dL — ABNORMAL LOW (ref 6.5–8.1)

## 2023-11-02 LAB — RESP PANEL BY RT-PCR (RSV, FLU A&B, COVID)  RVPGX2
Influenza A by PCR: NEGATIVE
Influenza B by PCR: NEGATIVE
Resp Syncytial Virus by PCR: NEGATIVE
SARS Coronavirus 2 by RT PCR: NEGATIVE

## 2023-11-02 NOTE — Telephone Encounter (Signed)
Received vm message from pt. She states she had called on call services over the weekend because she was experiencing discomfort at her port site-itching, redness and starting to feel unwell. She was started on Augmentin on Saturday night. By Sunday she was febrile to Tmax of 100.7 and she felt like she had the flu. This morning she states she feels abit better. Her port still has some redness around it. It is not draining anything. Advised that she will need to come here to be evaluated in [facility] as an infected port can have serious consequences. Pt is agreeable to come in at noon today.

## 2023-11-02 NOTE — Telephone Encounter (Signed)
Called patient to let her know CXR results does not show infection and her port is in the right place, and that she is negative for covid-19, flu, and rsv. Patient thanked me for the information and had no other concerns at this time.

## 2023-11-02 NOTE — Progress Notes (Signed)
Symptom Management Consult Note [facility]    Patient Care Team: Shade Flood, MD as PCP - General (Family Medicine) Yates Decamp, MD as Consulting Physician (Cardiology) Marzella Schlein., MD (Ophthalmology)    Name / MRN / DOB: Doris Wilkerson  [id number]  02-Jul-1946   Date of visit: 11/02/2023   Chief Complaint/Reason for visit: fever   Current Therapy: Kyprolis  Last treatment:  Day 15   Cycle 2 on 10/23/23   ASSESSMENT & PLAN: Patient is a 77 y.o. female with oncologic history of multiple myeloma not having achieved remission followed by Dr. Leonides Schanz.  I have viewed most recent oncology note and lab work.    #Multiple myeloma not having achieved remission  - Next appointment with oncologist is 11/06/23   #Fever -Patient well appearing. Afebrile, HDS. Port site has overlying erythema, no purulent drainage and incision is well healed. Clear lung exam. -CBC and CMP overall unremarkable. Covid and flu swab is negative. Blood cultures collected. No urinary symptoms or history of frequent UTI, will hold off on UA. -Chest xray without acute infectious process. -Will have patient continue Augmentin and RTC if symptoms worsen.   Strict ED precautions discussed should symptoms worsen.   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 Similar smaller 8 mm bone lesion in the right sacral alar. Similar smaller bone lesion in the L4 and L5 vertebral bodies. Long segment marrow edema within the left inferior pubic ramus and ischial tuberosity, with a similar larger lesion in the atrial tuberosity measuring 4.1 x 2.9 cm.. No lesions in the proximal femora.  . Joints: Preserved without evidence of effusion or subluxation.  .  Soft tissues: No soft tissue mass or fluid collection.  . Labrum: Intact  . Acetabular cartilage: Preserved  . Piriformis muscles and sciatic nerves: Symmetric.  Marland Kitchen Lower lumbosacral spine: Intact  . Tendons:Gluteus, iliopsoas, and hamstrings tendons intact.   01/11/2021 Imaging   1. Generalized osteopenia with no focal destructive or suspicious bone lesion identified radiographically. 2. Disc and endplate degeneration in the cervical spine, lumbar spine. Degenerative spondylolisthesis at L2-L3.   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 The plasma cells display lambda light chain restriction consistent with plasma cell neoplasm.  Correlation with cytogenetic and FISH studies is  recommended.    01/27/2021 Initial Diagnosis   Multiple myeloma not having achieved remission (HCC)   02/01/2021 - 07/12/2021 Chemotherapy   Patient is on Treatment Plan : MYELOMA NON-TRANSPLANT CANDIDATES VRd weekly q21d     03/21/2021 Cancer Staging   Staging form: Plasma Cell Myeloma and Plasma Cell Disorders, AJCC 8th Edition - Clinical stage from 03/21/2021: Beta-2-microglobulin (mg/L): 2.2, Albumin (g/dL): 4.5, ISS: Stage I - Signed by Artis Delay, MD on 03/21/2021 Beta 2 microglobulin range (mg/L): Less than 3.5 Albumin range (g/dL): Greater than or equal to  3.5 Serum calcium level: Elevated Pre-operative calcium level (mg/dL): 40.9 Serum creatinine level: Normal Creatinine (mg/dL): 0.8   06/24/1913 - 78/12/9560 Chemotherapy   Patient is on Treatment Plan : MYELOMA RELAPSED  REFRACTORY Daratumumab SQ + Pomalidomide + Dexamethasone (DaraPd) q28d     09/11/2023 -  Chemotherapy   Patient is on Treatment Plan : MYELOMA RELAPSED/REFRACTORY Carfilzomib (20/70) D1,8,15 + Dexamethasone weekly (40) (Kd) q28d  x 9 cycles / Dexamethasone D1,8,15         Interval history-: Discussed the use of AI scribe software for clinical note transcription with the patient, who gave verbal consent to proceed.   Doris Wilkerson is a 77 y.o. female with oncologic history as above presenting to [facility] today with chief complaint of fever. Patient presents unaccompanied to visit today.  The patient reports itching around port site started x 3 days ago and progressed to visible bumps the next day. The patient described the affected area as painful, akin to an inflamed sensation, but denied any throbbing. The discomfort extended to the joints and down the arm, but no swelling was reported. The patient also experienced a fever, which peaked at 100.7 yesterday. Her last dose of Tylenol was 6/7 AM today. Patient called triage line over the weekend and was prescribed Augmentin by the on-call oncologist. She has taken 4 pills so far. She reports significant improvement in symptoms. She denies any pain or itching at the site of the port. The patient reported a recent two-hour car journey on the Thursday before the itching started but denied any direct irritation to the port from the seatbelt. She also reported a previous incident where a dressing applied after infusion caused irritation and bleeding upon removal. The patient denied any other new symptoms, exposure to sick individuals. Denies congestion, cough, abdominal pain, urinary symptoms. She denies any new medications.   The patient has been on Kyprolis since October.  She reported good tolerance to the treatment. The patient also mentioned an ongoing issue with cortisol production, with an upcoming video conference scheduled with an  endocrinologist.  ROS  All other systems are reviewed and are negative for acute change except as noted in the HPI.    Allergies  Allergen Reactions   Poison Ivy Extract Swelling   Plavix [Clopidogrel Bisulfate]    Other     seasonal     Past Medical History:  Diagnosis Date   Asthma    Cataract    GERD (gastroesophageal reflux disease)    Hyperlipidemia    Hypertension    Thyroid disease      Past Surgical History:  Procedure Laterality Date   Cataract Surgery     CORONARY ANGIOPLASTY WITH STENT PLACEMENT     EYE SURGERY     IR IMAGING GUIDED PORT INSERTION  09/09/2023   ORTHOPEDIC SURGERY  2012   TONSILLECTOMY  1951    Social History   Socioeconomic History   Marital status: Married    Spouse name: Not on file   Number of children: 0   Years of education: Not on file   Highest education level: Master's degree (e.g., MA, MS, MEng, MEd, MSW, MBA)  Occupational History   Occupation: unemployed  Tobacco Use   Smoking status: Never   Smokeless tobacco: Never  Vaping Use   Vaping status: Never Used  Substance and Sexual Activity   Alcohol use: Yes    Alcohol/week: 1.0 standard drink of alcohol    Types: 1 Glasses of wine per week  Comment: occassionally   Drug use: No   Sexual activity: Not Currently  Other Topics Concern   Not on file  Social History Narrative   Married. Education: college. Pt does exercise-   Social Determinants of Health   Financial Resource Strain: Low Risk  (04/24/2023)   Overall Financial Resource Strain (CARDIA)    Difficulty of Paying Living Expenses: Not hard at all  Food Insecurity: No Food Insecurity (04/24/2023)   Hunger Vital Sign    Worried About Running Out of Food in the Last Year: Never true    Ran Out of Food in the Last Year: Never true  Transportation Needs: No Transportation Needs (04/24/2023)   PRAPARE - Administrator, Civil Service (Medical): No    Lack of Transportation (Non-Medical): No   Physical Activity: Insufficiently Active (04/24/2023)   Exercise Vital Sign    Days of Exercise per Week: 7 days    Minutes of Exercise per Session: 20 min  Stress: No Stress Concern Present (04/24/2023)   [employer] of Occupational Health - Occupational Stress Questionnaire    Feeling of Stress : Only a little  Social Connections: Moderately Integrated (04/24/2023)   Social Connection and Isolation Panel [NHANES]    Frequency of Communication with Friends and Family: Twice a week    Frequency of Social Gatherings with Friends and Family: Once a week    Attends Religious Services: Never    Database administrator or Organizations: Yes    Attends Engineer, structural: More than 4 times per year    Marital Status: Married  Catering manager Violence: Not At Risk (04/08/2023)   Humiliation, Afraid, Rape, and Kick questionnaire    Fear of Current or Ex-Partner: No    Emotionally Abused: No    Physically Abused: No    Sexually Abused: No    Family History  Problem Relation Age of Onset   Cancer Father    Heart disease Brother    Dementia Brother    Leukemia Brother    Breast cancer Neg Hx    Colon cancer Neg Hx    Esophageal cancer Neg Hx    Pancreatic cancer Neg Hx    Rectal cancer Neg Hx    Stomach cancer Neg Hx      Current Outpatient Medications:    acyclovir (ZOVIRAX) 400 MG tablet, Take 1 tablet (400 mg total) by mouth 2 (two) times daily., Disp: 180 tablet, Rfl: 2   amLODipine (NORVASC) 5 MG tablet, Take 1 tablet (5 mg total) by mouth daily., Disp: 90 tablet, Rfl: 1   aspirin 81 MG tablet, Take 81 mg by mouth daily. , Disp: , Rfl:    atorvastatin (LIPITOR) 80 MG tablet, TAKE 1 TABLET(80 MG) BY MOUTH DAILY AT 6 PM, Disp: 90 tablet, Rfl: 1   benazepril (LOTENSIN) 10 MG tablet, TAKE 1 TABLET(10 MG) BY MOUTH DAILY, Disp: 90 tablet, Rfl: 1   Blood Glucose Monitoring Suppl DEVI, 1 each by Does not apply route as needed (symptoms of low blood sugar.). May substitute  to any manufacturer covered by patient's insurance., Disp: 1 each, Rfl: 0   calcium carbonate (TUMS - DOSED IN MG ELEMENTAL CALCIUM) 500 MG chewable tablet, Chew 1 tablet by mouth daily. Daily PRN, Disp: , Rfl:    carfilzomib (KYPROLIS) 10 MG SOLR, Inject 70 mg into the vein once a week., Disp: , Rfl:    cholecalciferol (VITAMIN D3) 25 MCG (1000 UNIT) tablet, Take 2,000 Units by mouth  daily., Disp: , Rfl:    Continuous Glucose Sensor (FREESTYLE LIBRE 3 SENSOR) MISC, 1 Device by Does not apply route continuous. Place 1 sensor on the skin every 14 days. Use to check glucose continuously, Disp: 2 each, Rfl: 11   dexamethasone (DECADRON) 4 MG tablet, Take 10 tablets (40 mg total) by mouth once a week., Disp: 30 tablet, Rfl: 5   gabapentin (NEURONTIN) 100 MG capsule, TAKE 1 CAPSULE(100 MG) BY MOUTH TWICE DAILY AS NEEDED, Disp: 180 capsule, Rfl: 1   levothyroxine (SYNTHROID) 25 MCG tablet, TAKE 1 TABLET(25 MCG) BY MOUTH DAILY BEFORE BREAKFAST, Disp: 90 tablet, Rfl: 3   lidocaine-prilocaine (EMLA) cream, Apply 1 Application topically as needed., Disp: 30 g, Rfl: 0   Magnesium 100 MG TABS, Take 100 mg by mouth daily., Disp: , Rfl:    nitroGLYCERIN (NITROSTAT) 0.4 MG SL tablet, Place 1 tablet (0.4 mg total) under the tongue every 5 (five) minutes as needed for chest pain., Disp: 25 tablet, Rfl: 1   polyethylene glycol (MIRALAX / GLYCOLAX) 17 g packet, Take 17 g by mouth daily as needed., Disp: , Rfl:   PHYSICAL EXAM: ECOG FS:1 - Symptomatic but completely ambulatory    Vitals:   11/02/23 1205  BP: 103/66  Pulse: 96  Resp: 16  Temp: 97.9 F (36.6 C)  TempSrc: Temporal  SpO2: 99%  Weight: 141 lb 9.6 oz (64.2 kg)   Physical Exam Vitals and nursing note reviewed.  Constitutional:      Appearance: She is not ill-appearing or toxic-appearing.  HENT:     Head: Normocephalic.  Eyes:     Conjunctiva/sclera: Conjunctivae normal.  Cardiovascular:     Rate and Rhythm: Normal rate and regular rhythm.      Pulses: Normal pulses.     Heart sounds: Normal heart sounds.  Pulmonary:     Effort: Pulmonary effort is normal.     Breath sounds: Normal breath sounds.  Chest:     Comments: Please see media below.  No surrounding tenderness or edema to PAC. Overlying erythema. No streaking. No open wound Abdominal:     General: There is no distension.  Musculoskeletal:     Cervical back: Normal range of motion.  Skin:    General: Skin is warm and dry.  Neurological:     Mental Status: She is alert.        LABORATORY DATA: I have reviewed the data as listed    Latest Ref Rng & Units 11/02/2023   12:30 PM 10/23/2023    8:29 AM 10/16/2023    7:57 AM  CBC  WBC 4.0 - 10.5 K/uL 6.4  8.7  4.3   Hemoglobin 12.0 - 15.0 g/dL 08.6  76.1  95.0   Hematocrit 36.0 - 46.0 % 35.6  35.9  35.0   Platelets 150 - 400 K/uL 272  165  148         Latest Ref Rng & Units 11/02/2023   12:30 PM 10/23/2023    8:29 AM 10/16/2023    7:57 AM  CMP  Glucose 70 - 99 mg/dL 932  95  671   BUN 8 - 23 mg/dL 16  21  19    Creatinine 0.44 - 1.00 mg/dL 2.45  8.09  9.83   Sodium 135 - 145 mmol/L 137  138  138   Potassium 3.5 - 5.1 mmol/L 3.6  4.2  4.0   Chloride 98 - 111 mmol/L 105  107  107   CO2 22 - 32  mmol/L 25  25  26    Calcium 8.9 - 10.3 mg/dL 9.7  9.7  9.8   Total Protein 6.5 - 8.1 g/dL 6.2  6.1  6.0   Total Bilirubin <1.2 mg/dL 0.7  0.9  0.9   Alkaline Phos 38 - 126 U/L 57  58  59   AST 15 - 41 U/L 16  16  19    ALT 0 - 44 U/L 13  15  16         RADIOGRAPHIC STUDIES (from last 24 hours if applicable) I have personally reviewed the radiological images as listed and agreed with the findings in the report. DG Chest 2 View  Result Date: 11/02/2023 CLINICAL DATA:  Fever. EXAM: CHEST - 2 VIEW COMPARISON:  06/20/2011. FINDINGS: Trachea is midline. Heart size normal. Right IJ Port-A-Cath tip is in the SVC. Lungs are hyperinflated. Biapical pleuroparenchymal scarring. No airspace consolidation or pleural  fluid. IMPRESSION: Hyperinflation without acute finding. Electronically Signed   By: Leanna Battles M.D.   On: 11/02/2023 15:32        Visit Diagnosis: 1. Multiple myeloma not having achieved remission (HCC)   2. Port-A-Cath in place   3. Fever in adult      No orders of the defined types were placed in this encounter.   All questions were answered. The patient knows to call the clinic with any problems, questions or concerns. No barriers to learning was detected.  A total of more than 30 minutes were spent on this encounter with face-to-face time and non-face-to-face time, including preparing to see the patient, ordering tests and/or medications, counseling the patient and coordination of care as outlined above.    Thank you for allowing me to participate in the care of this patient.    Shanon Ace, PA-C Department of Hematology/Oncology [facility] at [facility] Phone: [phone]  [phone]    11/02/2023 4:03 PM

## 2023-11-05 ENCOUNTER — Encounter: Payer: Self-pay | Admitting: Physician Assistant

## 2023-11-05 ENCOUNTER — Ambulatory Visit (INDEPENDENT_AMBULATORY_CARE_PROVIDER_SITE_OTHER): Payer: Medicare Other | Admitting: "Endocrinology

## 2023-11-05 ENCOUNTER — Other Ambulatory Visit: Payer: Medicare Other

## 2023-11-05 VITALS — BP 120/60 | HR 94 | Ht 66.0 in | Wt >= 6400 oz

## 2023-11-05 DIAGNOSIS — R7989 Other specified abnormal findings of blood chemistry: Secondary | ICD-10-CM | POA: Diagnosis not present

## 2023-11-05 DIAGNOSIS — C9000 Multiple myeloma not having achieved remission: Secondary | ICD-10-CM

## 2023-11-05 DIAGNOSIS — E162 Hypoglycemia, unspecified: Secondary | ICD-10-CM

## 2023-11-05 MED ORDER — COSYNTROPIN 0.25 MG IJ SOLR
0.2500 mg | Freq: Once | INTRAMUSCULAR | Status: AC
  Administered 2023-11-05: 0.25 mg via INTRAVENOUS

## 2023-11-05 NOTE — Progress Notes (Signed)
After obtaining consent, and per orders of Dr.Motwani, injection of Cortrosyn given by Leota Sauers. Patient instructed to remain in clinic for 20 minutes afterwards, and to report any adverse reaction to me immediately.

## 2023-11-05 NOTE — Progress Notes (Signed)
Outpatient Endocrinology Note Doris Oakdale, MD    Doris Wilkerson Dec 28, 1945 [id number]  Referring Provider: Shade Flood, MD Primary Care Provider: Shade Flood, MD Reason for consultation: Subjective   Assessment & Plan  Diagnoses and all orders for this visit:  Low serum cortisol level -     Cortisol -     Cortisol -     Cortisol -     cosyntropin (CORTROSYN) injection 0.25 mg  Multiple myeloma not having achieved remission (HCC)  Hypoglycemia    Patient is on phase 3 of multiple myeloma treatment  Patient wore CGM (no date available) reports she had 2 episodes of low blood sugar, lowest being 53 (was on dexamethasone 20 mg weekly) Patient is now on dexamethasone 40 mg weekly as part of multiple myeloma treatment  Been on dexamethasone for treatment in 2022 and and in 2024 4.7 cortisol day before weekly steroid, ordered cosyntropin stimulation test today A1C 5.8, keep a watch for low BG day 3-7 post dexamethasone and A1C  Patient has BG meter and supplies Ordered libre 3 previously   Has a f/u on 01/04/23  No follow-ups on file.   I have reviewed current medications, nurse's notes, allergies, vital signs, past medical and surgical history, family medical history, and social history for this encounter. Counseled patient on symptoms, examination findings, lab findings, imaging results, treatment decisions and monitoring and prognosis. The patient understood the recommendations and agrees with the treatment plan. All questions regarding treatment plan were fully answered.  Doris Ozora, MD  11/05/23   History of Present Illness HPI  Doris Wilkerson is a 77 y.o. year old female who presents for evaluation of hypoglycemia.  Diagnosed of multiple myeloma in 2022  No more episodes of low blood sugars She is currently on 40 mg of dexamethasone weekly as part of treatment and plan is to stay on it indefinitely  Day of steroid gets high energy, day  3 energy comes down but patient doesn't feel need of anything to supplement her energy day prior of steroid   Initial history:  Patient is on phase 3 of multiple myeloma treatment which is not curable per patient  She was having low BG (lowest 53) during phase 2 of her treatment Patient was weak and wobbly on walking when she had low BG (no symptoms sitting down)   Physical Exam  BP 120/60   Pulse 94   Ht 5\' 6"  (1.676 m)   Wt (!) 1410 lb (639.6 kg)   SpO2 99%   BMI 227.58 kg/m    Constitutional: well developed, well nourished Head: normocephalic, atraumatic Eyes: sclera anicteric, no redness Neck: supple Lungs: normal respiratory effort Neurology: alert and oriented Skin: dry, no appreciable rashes Musculoskeletal: no appreciable defects Psychiatric: normal mood and affect   Current Medications Patient's Medications  New Prescriptions   No medications on file  Previous Medications   ACYCLOVIR (ZOVIRAX) 400 MG TABLET    Take 1 tablet (400 mg total) by mouth 2 (two) times daily.   AMLODIPINE (NORVASC) 5 MG TABLET    Take 1 tablet (5 mg total) by mouth daily.   ASPIRIN 81 MG TABLET    Take 81 mg by mouth daily.    ATORVASTATIN (LIPITOR) 80 MG TABLET    TAKE 1 TABLET(80 MG) BY MOUTH DAILY AT 6 PM   BENAZEPRIL (LOTENSIN) 10 MG TABLET    TAKE 1 TABLET(10 MG) BY MOUTH DAILY   BLOOD GLUCOSE MONITORING SUPPL DEVI  1 each by Does not apply route as needed (symptoms of low blood sugar.). May substitute to any manufacturer covered by patient's insurance.   CALCIUM CARBONATE (TUMS - DOSED IN MG ELEMENTAL CALCIUM) 500 MG CHEWABLE TABLET    Chew 1 tablet by mouth daily. Daily PRN   CARFILZOMIB (KYPROLIS) 10 MG SOLR    Inject 70 mg into the vein once a week.   CHOLECALCIFEROL (VITAMIN D3) 25 MCG (1000 UNIT) TABLET    Take 2,000 Units by mouth daily.   CONTINUOUS GLUCOSE SENSOR (FREESTYLE LIBRE 3 SENSOR) MISC    1 Device by Does not apply route continuous. Place 1 sensor on the skin every  14 days. Use to check glucose continuously   DEXAMETHASONE (DECADRON) 4 MG TABLET    Take 10 tablets (40 mg total) by mouth once a week.   GABAPENTIN (NEURONTIN) 100 MG CAPSULE    TAKE 1 CAPSULE(100 MG) BY MOUTH TWICE DAILY AS NEEDED   LEVOTHYROXINE (SYNTHROID) 25 MCG TABLET    TAKE 1 TABLET(25 MCG) BY MOUTH DAILY BEFORE BREAKFAST   LIDOCAINE-PRILOCAINE (EMLA) CREAM    Apply 1 Application topically as needed.   MAGNESIUM 100 MG TABS    Take 100 mg by mouth daily.   NITROGLYCERIN (NITROSTAT) 0.4 MG SL TABLET    Place 1 tablet (0.4 mg total) under the tongue every 5 (five) minutes as needed for chest pain.   POLYETHYLENE GLYCOL (MIRALAX / GLYCOLAX) 17 G PACKET    Take 17 g by mouth daily as needed.  Modified Medications   No medications on file  Discontinued Medications   No medications on file    Allergies Allergies  Allergen Reactions   Poison Ivy Extract Swelling   Plavix [Clopidogrel Bisulfate]    Other     seasonal    Past Medical History Past Medical History:  Diagnosis Date   Asthma    Cataract    GERD (gastroesophageal reflux disease)    Hyperlipidemia    Hypertension    Thyroid disease     Past Surgical History Past Surgical History:  Procedure Laterality Date   Cataract Surgery     CORONARY ANGIOPLASTY WITH STENT PLACEMENT     EYE SURGERY     IR IMAGING GUIDED PORT INSERTION  09/09/2023   ORTHOPEDIC SURGERY  2012   TONSILLECTOMY  1951    Family History family history includes Cancer in her father; Dementia in her brother; Heart disease in her brother; Leukemia in her brother.  Social History Social History   Socioeconomic History   Marital status: Married    Spouse name: Not on file   Number of children: 0   Years of education: Not on file   Highest education level: Master's degree (e.g., MA, MS, MEng, MEd, MSW, MBA)  Occupational History   Occupation: unemployed  Tobacco Use   Smoking status: Never   Smokeless tobacco: Never  Vaping Use   Vaping  status: Never Used  Substance and Sexual Activity   Alcohol use: Yes    Alcohol/week: 1.0 standard drink of alcohol    Types: 1 Glasses of wine per week    Comment: occassionally   Drug use: No   Sexual activity: Not Currently  Other Topics Concern   Not on file  Social History Narrative   Married. Education: college. Pt does exercise-   Social Drivers of Health   Financial Resource Strain: Low Risk  (04/24/2023)   Overall Financial Resource Strain (CARDIA)    Difficulty of  Paying Living Expenses: Not hard at all  Food Insecurity: No Food Insecurity (04/24/2023)   Hunger Vital Sign    Worried About Running Out of Food in the Last Year: Never true    Ran Out of Food in the Last Year: Never true  Transportation Needs: No Transportation Needs (04/24/2023)   PRAPARE - Administrator, Civil Service (Medical): No    Lack of Transportation (Non-Medical): No  Physical Activity: Insufficiently Active (04/24/2023)   Exercise Vital Sign    Days of Exercise per Week: 7 days    Minutes of Exercise per Session: 20 min  Stress: No Stress Concern Present (04/24/2023)   [employer] of Occupational Health - Occupational Stress Questionnaire    Feeling of Stress : Only a little  Social Connections: Moderately Integrated (04/24/2023)   Social Connection and Isolation Panel [NHANES]    Frequency of Communication with Friends and Family: Twice a week    Frequency of Social Gatherings with Friends and Family: Once a week    Attends Religious Services: Never    Database administrator or Organizations: Yes    Attends Banker Meetings: More than 4 times per year    Marital Status: Married  Catering manager Violence: Not At Risk (04/08/2023)   Humiliation, Afraid, Rape, and Kick questionnaire    Fear of Current or Ex-Partner: No    Emotionally Abused: No    Physically Abused: No    Sexually Abused: No    Lab Results  Component Value Date   CHOL 151 07/09/2023   Lab  Results  Component Value Date   HDL 76.40 07/09/2023   Lab Results  Component Value Date   LDLCALC 63 07/09/2023   Lab Results  Component Value Date   TRIG 56.0 07/09/2023   Lab Results  Component Value Date   CHOLHDL 2 07/09/2023   Lab Results  Component Value Date   CREATININE 0.75 11/02/2023   Lab Results  Component Value Date   GFR 70.49 01/08/2023      Component Value Date/Time   NA 137 11/02/2023 1230   NA 140 12/06/2020 1031   K 3.6 11/02/2023 1230   CL 105 11/02/2023 1230   CO2 25 11/02/2023 1230   GLUCOSE 106 (H) 11/02/2023 1230   BUN 16 11/02/2023 1230   BUN 14 12/06/2020 1031   CREATININE 0.75 11/02/2023 1230   CREATININE 0.71 08/25/2016 1048   CALCIUM 9.7 11/02/2023 1230   PROT 6.2 (L) 11/02/2023 1230   PROT 6.8 06/01/2020 1009   ALBUMIN 4.1 11/02/2023 1230   ALBUMIN 5.1 (H) 06/01/2020 1009   AST 16 11/02/2023 1230   ALT 13 11/02/2023 1230   ALKPHOS 57 11/02/2023 1230   BILITOT 0.7 11/02/2023 1230   GFRNONAA >60 11/02/2023 1230   GFRNONAA 87 08/25/2016 1048   GFRAA 85 12/06/2020 1031   GFRAA >89 08/25/2016 1048      Latest Ref Rng & Units 11/02/2023   12:30 PM 10/23/2023    8:29 AM 10/16/2023    7:57 AM  BMP  Glucose 70 - 99 mg/dL 161  95  096   BUN 8 - 23 mg/dL 16  21  19    Creatinine 0.44 - 1.00 mg/dL 0.45  4.09  8.11   Sodium 135 - 145 mmol/L 137  138  138   Potassium 3.5 - 5.1 mmol/L 3.6  4.2  4.0   Chloride 98 - 111 mmol/L 105  107  107   CO2  22 - 32 mmol/L 25  25  26    Calcium 8.9 - 10.3 mg/dL 9.7  9.7  9.8        Component Value Date/Time   WBC 6.4 11/02/2023 1230   WBC 5.7 01/21/2021 0739   RBC 3.77 (L) 11/02/2023 1230   HGB 12.0 11/02/2023 1230   HGB 12.0 12/06/2020 1031   HCT 35.6 (L) 11/02/2023 1230   HCT 36.2 12/06/2020 1031   PLT 272 11/02/2023 1230   PLT 248 12/06/2020 1031   MCV 94.4 11/02/2023 1230   MCV 89 12/06/2020 1031   MCH 31.8 11/02/2023 1230   MCHC 33.7 11/02/2023 1230   RDW 16.5 (H) 11/02/2023 1230    RDW 13.2 12/06/2020 1031   LYMPHSABS 0.2 (L) 11/02/2023 1230   LYMPHSABS 1.8 09/04/2017 1536   MONOABS 0.4 11/02/2023 1230   EOSABS 0.3 11/02/2023 1230   EOSABS 0.2 09/04/2017 1536   BASOSABS 0.0 11/02/2023 1230   BASOSABS 0.1 09/04/2017 1536   Lab Results  Component Value Date   TSH 1.63 07/09/2023   TSH 1.29 01/08/2023   TSH 1.02 07/03/2022         Parts of this note may have been dictated using voice recognition software. There may be variances in spelling and vocabulary which are unintentional. Not all errors are proofread. Please notify the Thereasa Parkin if any discrepancies are noted or if the meaning of any statement is not clear.

## 2023-11-06 ENCOUNTER — Other Ambulatory Visit: Payer: Self-pay | Admitting: Hematology and Oncology

## 2023-11-06 ENCOUNTER — Inpatient Hospital Stay: Payer: Medicare Other

## 2023-11-06 ENCOUNTER — Inpatient Hospital Stay (HOSPITAL_BASED_OUTPATIENT_CLINIC_OR_DEPARTMENT_OTHER): Payer: Medicare Other | Admitting: Hematology and Oncology

## 2023-11-06 VITALS — BP 142/69 | HR 103 | Temp 98.6°F | Resp 17 | Wt 139.9 lb

## 2023-11-06 VITALS — BP 134/75 | HR 91

## 2023-11-06 DIAGNOSIS — Z95828 Presence of other vascular implants and grafts: Secondary | ICD-10-CM

## 2023-11-06 DIAGNOSIS — M899 Disorder of bone, unspecified: Secondary | ICD-10-CM

## 2023-11-06 DIAGNOSIS — R509 Fever, unspecified: Secondary | ICD-10-CM | POA: Diagnosis not present

## 2023-11-06 DIAGNOSIS — C9000 Multiple myeloma not having achieved remission: Secondary | ICD-10-CM

## 2023-11-06 DIAGNOSIS — Z7961 Long term (current) use of immunomodulator: Secondary | ICD-10-CM | POA: Diagnosis not present

## 2023-11-06 DIAGNOSIS — E162 Hypoglycemia, unspecified: Secondary | ICD-10-CM | POA: Diagnosis not present

## 2023-11-06 DIAGNOSIS — Z5112 Encounter for antineoplastic immunotherapy: Secondary | ICD-10-CM | POA: Diagnosis not present

## 2023-11-06 DIAGNOSIS — Z79624 Long term (current) use of inhibitors of nucleotide synthesis: Secondary | ICD-10-CM | POA: Diagnosis not present

## 2023-11-06 LAB — CBC WITH DIFFERENTIAL (CANCER CENTER ONLY)
Abs Immature Granulocytes: 0.03 10*3/uL (ref 0.00–0.07)
Basophils Absolute: 0 10*3/uL (ref 0.0–0.1)
Basophils Relative: 1 %
Eosinophils Absolute: 0 10*3/uL (ref 0.0–0.5)
Eosinophils Relative: 0 %
HCT: 32.7 % — ABNORMAL LOW (ref 36.0–46.0)
Hemoglobin: 11.1 g/dL — ABNORMAL LOW (ref 12.0–15.0)
Immature Granulocytes: 1 %
Lymphocytes Relative: 4 %
Lymphs Abs: 0.2 10*3/uL — ABNORMAL LOW (ref 0.7–4.0)
MCH: 31.6 pg (ref 26.0–34.0)
MCHC: 33.9 g/dL (ref 30.0–36.0)
MCV: 93.2 fL (ref 80.0–100.0)
Monocytes Absolute: 0.1 10*3/uL (ref 0.1–1.0)
Monocytes Relative: 1 %
Neutro Abs: 5.9 10*3/uL (ref 1.7–7.7)
Neutrophils Relative %: 93 %
Platelet Count: 335 10*3/uL (ref 150–400)
RBC: 3.51 MIL/uL — ABNORMAL LOW (ref 3.87–5.11)
RDW: 15.7 % — ABNORMAL HIGH (ref 11.5–15.5)
WBC Count: 6.3 10*3/uL (ref 4.0–10.5)
nRBC: 0 % (ref 0.0–0.2)

## 2023-11-06 LAB — CMP (CANCER CENTER ONLY)
ALT: 15 U/L (ref 0–44)
AST: 15 U/L (ref 15–41)
Albumin: 4.2 g/dL (ref 3.5–5.0)
Alkaline Phosphatase: 57 U/L (ref 38–126)
Anion gap: 8 (ref 5–15)
BUN: 22 mg/dL (ref 8–23)
CO2: 23 mmol/L (ref 22–32)
Calcium: 9.4 mg/dL (ref 8.9–10.3)
Chloride: 107 mmol/L (ref 98–111)
Creatinine: 0.79 mg/dL (ref 0.44–1.00)
GFR, Estimated: >60 mL/min (ref >60)
Glucose, Bld: 225 mg/dL — ABNORMAL HIGH (ref 70–99)
Potassium: 3.8 mmol/L (ref 3.5–5.1)
Sodium: 138 mmol/L (ref 135–145)
Total Bilirubin: 0.6 mg/dL (ref <1.2)
Total Protein: 6.3 g/dL — ABNORMAL LOW (ref 6.5–8.1)

## 2023-11-06 MED ORDER — SODIUM CHLORIDE 0.9% FLUSH
10.0000 mL | INTRAVENOUS | Status: DC | PRN
  Administered 2023-11-06: 10 mL

## 2023-11-06 MED ORDER — HEPARIN SOD (PORK) LOCK FLUSH 100 UNIT/ML IV SOLN
500.0000 [IU] | Freq: Once | INTRAVENOUS | Status: AC | PRN
  Administered 2023-11-06: 500 [IU]

## 2023-11-06 MED ORDER — SODIUM CHLORIDE 0.9% FLUSH
10.0000 mL | Freq: Once | INTRAVENOUS | Status: AC
  Administered 2023-11-06: 10 mL

## 2023-11-06 MED ORDER — DEXTROSE 5 % IV SOLN
70.0000 mg/m2 | Freq: Once | INTRAVENOUS | Status: AC
  Administered 2023-11-06: 120 mg via INTRAVENOUS
  Filled 2023-11-06: qty 60

## 2023-11-06 MED ORDER — SODIUM CHLORIDE 0.9 % IV SOLN: Freq: Once | INTRAVENOUS | Status: AC

## 2023-11-06 NOTE — Progress Notes (Signed)
[facility] Telephone:([phone]   [phone]) [phone]  PROGRESS NOTE  Patient Care Team: Shade Flood, MD as PCP - General (Family Medicine) Yates Decamp, MD as Consulting Physician (Cardiology) Marzella Schlein., MD (Ophthalmology)  Hematological/Oncological History # Lambda Light Chain Multiple Myeloma 12/26/2020: MRI of pelvis showed lytic lesions on L4, L5, the sacrum, with pathologic fracture of left inferior pubic ramus. Concerning for multiple myeloma vs metastatic disease 12/31/2020: establish care with Dr. Leonides Schanz. UPEP showed marked Bence Jones protein, findings concerning for multiple myleoma 01/21/2021: bone marrow biopsy confirms multiple myeloma with atypical plasma cells representing 28% of all cells in the aspirate  02/01/2021: Cycle 1 Day 1 of VRd chemotherapy 02/22/2021: Cycle 2 Day 1 of VRd chemotherapy 03/15/2021: Cycle 3 Day 1 of VRd chemotherapy 04/05/2021: Cycle 4 Day 1 of VRd chemotherapy 04/26/2021: Cycle 5 Day 1 of VRd chemotherapy 05/17/2021: Cycle 6 Day 1 of VRd chemotherapy 06/07/2021:  Cycle 7 Day 1 of VRd chemotherapy 06/28/2021: Cycle 8 Day 1 of VRd chemotherapy 07/19/2021: start maintenance revlimid 10mg  PO daily.  03/26/2022: Cycle 1 Day 1 of Dara/Pom/Dex 04/24/2023: Cycle 2 Day 1 of Dara/Pom/Dex 05/22/2023: Cycle 3 Day 1 of Dara/Pom/Dex 06/19/2023: Cycle 4 Day 1 of Dara/Pom/Dex 07/17/2023: Cycle 5 Day 1 of Dara/Pom/Dex 08/28/2023: d/c Dara/Pom/Dex due to progression.  09/11/2023: Cycle 1 Day 1 of Kyprolis/Dex 10/08/2023: Cycle 2 Day 1 of Kyprolis/Dex 11/06/2023: Cycle 3 Day 1 of Kyprolis/Dex  Interval History:  Doris Wilkerson 77 y.o. female with medical history significant for lambda light chain multiple myeloma who presents for a follow up visit. She was last seen on 10/23/2023. In the interim, she has discontinued Dara/Pom/Dex and will start Kyprolis/Dex today. She is due for Cycle 3, Day 1 today. She is accompanied by her husband for this visit.    On exam today Mrs. Volner reports she is currently undergoing testing for adrenal insufficiency with endocrinology.  She is currently waiting on the results.  She reports she will be seeing them again in February 2025.  She notes that she is tolerating Kyprolis well with no major symptoms.  She did have concern for a superficial infection over her port which has successfully been treated with antibiotics.  There is no longer any redness or pain.  Blood cultures were negative.  She reports that it was quite sensitive to touch but has markedly improved.  She notes that otherwise she has had no major changes in her health.  She denies any runny nose, sore throat, or cough.  She is willing and able to proceed with treatment at this time.  She denies easy bruising or signs of bleeding. She denies fevers, chills, night sweats, shortness of breath, chest pain or cough. She has no other complaints. A full 10 point ROS is listed below.    MEDICAL HISTORY:  Past Medical History:  Diagnosis Date   Asthma    Cataract    GERD (gastroesophageal reflux disease)    Hyperlipidemia    Hypertension    Thyroid disease     SURGICAL HISTORY: Past Surgical History:  Procedure Laterality Date   Cataract Surgery     CORONARY ANGIOPLASTY WITH STENT PLACEMENT     EYE SURGERY     IR IMAGING GUIDED PORT INSERTION  09/09/2023   ORTHOPEDIC SURGERY  2012   TONSILLECTOMY  1951    SOCIAL HISTORY: Social History   Socioeconomic History   Marital status: Married    Spouse name: Not on file  Number of children: 0   Years of education: Not on file   Highest education level: Master's degree (e.g., MA, MS, MEng, MEd, MSW, MBA)  Occupational History   Occupation: unemployed  Tobacco Use   Smoking status: Never   Smokeless tobacco: Never  Vaping Use   Vaping status: Never Used  Substance and Sexual Activity   Alcohol use: Yes    Alcohol/week: 1.0 standard drink of alcohol    Types: 1 Glasses of wine per week     Comment: occassionally   Drug use: No   Sexual activity: Not Currently  Other Topics Concern   Not on file  Social History Narrative   Married. Education: college. Pt does exercise-   Social Drivers of Health   Financial Resource Strain: Low Risk  (04/24/2023)   Overall Financial Resource Strain (CARDIA)    Difficulty of Paying Living Expenses: Not hard at all  Food Insecurity: No Food Insecurity (04/24/2023)   Hunger Vital Sign    Worried About Running Out of Food in the Last Year: Never true    Ran Out of Food in the Last Year: Never true  Transportation Needs: No Transportation Needs (04/24/2023)   PRAPARE - Administrator, Civil Service (Medical): No    Lack of Transportation (Non-Medical): No  Physical Activity: Insufficiently Active (04/24/2023)   Exercise Vital Sign    Days of Exercise per Week: 7 days    Minutes of Exercise per Session: 20 min  Stress: No Stress Concern Present (04/24/2023)   [employer] of Occupational Health - Occupational Stress Questionnaire    Feeling of Stress : Only a little  Social Connections: Moderately Integrated (04/24/2023)   Social Connection and Isolation Panel [NHANES]    Frequency of Communication with Friends and Family: Twice a week    Frequency of Social Gatherings with Friends and Family: Once a week    Attends Religious Services: Never    Database administrator or Organizations: Yes    Attends Engineer, structural: More than 4 times per year    Marital Status: Married  Catering manager Violence: Not At Risk (04/08/2023)   Humiliation, Afraid, Rape, and Kick questionnaire    Fear of Current or Ex-Partner: No    Emotionally Abused: No    Physically Abused: No    Sexually Abused: No    FAMILY HISTORY: Family History  Problem Relation Age of Onset   Cancer Father    Heart disease Brother    Dementia Brother    Leukemia Brother    Breast cancer Neg Hx    Colon cancer Neg Hx    Esophageal cancer Neg Hx     Pancreatic cancer Neg Hx    Rectal cancer Neg Hx    Stomach cancer Neg Hx     ALLERGIES:  is allergic to poison ivy extract, plavix [clopidogrel bisulfate], and other.  MEDICATIONS:  Current Outpatient Medications  Medication Sig Dispense Refill   acyclovir (ZOVIRAX) 400 MG tablet Take 1 tablet (400 mg total) by mouth 2 (two) times daily. 180 tablet 2   amLODipine (NORVASC) 5 MG tablet Take 1 tablet (5 mg total) by mouth daily. 90 tablet 1   aspirin 81 MG tablet Take 81 mg by mouth daily.      atorvastatin (LIPITOR) 80 MG tablet TAKE 1 TABLET(80 MG) BY MOUTH DAILY AT 6 PM 90 tablet 1   benazepril (LOTENSIN) 10 MG tablet TAKE 1 TABLET(10 MG) BY MOUTH DAILY 90 tablet  1   Blood Glucose Monitoring Suppl DEVI 1 each by Does not apply route as needed (symptoms of low blood sugar.). May substitute to any manufacturer covered by patient's insurance. 1 each 0   calcium carbonate (TUMS - DOSED IN MG ELEMENTAL CALCIUM) 500 MG chewable tablet Chew 1 tablet by mouth daily. Daily PRN     carfilzomib (KYPROLIS) 10 MG SOLR Inject 70 mg into the vein once a week.     cholecalciferol (VITAMIN D3) 25 MCG (1000 UNIT) tablet Take 2,000 Units by mouth daily.     Continuous Glucose Sensor (FREESTYLE LIBRE 3 SENSOR) MISC 1 Device by Does not apply route continuous. Place 1 sensor on the skin every 14 days. Use to check glucose continuously 2 each 11   dexamethasone (DECADRON) 4 MG tablet Take 10 tablets (40 mg total) by mouth once a week. 30 tablet 5   gabapentin (NEURONTIN) 100 MG capsule TAKE 1 CAPSULE(100 MG) BY MOUTH TWICE DAILY AS NEEDED 180 capsule 1   levothyroxine (SYNTHROID) 25 MCG tablet TAKE 1 TABLET(25 MCG) BY MOUTH DAILY BEFORE BREAKFAST 90 tablet 3   lidocaine-prilocaine (EMLA) cream Apply 1 Application topically as needed. 30 g 0   Magnesium 100 MG TABS Take 100 mg by mouth daily.     nitroGLYCERIN (NITROSTAT) 0.4 MG SL tablet Place 1 tablet (0.4 mg total) under the tongue every 5 (five) minutes  as needed for chest pain. 25 tablet 1   polyethylene glycol (MIRALAX / GLYCOLAX) 17 g packet Take 17 g by mouth daily as needed.     No current facility-administered medications for this visit.   Facility-Administered Medications Ordered in Other Visits  Medication Dose Route Frequency Provider Last Rate Last Admin   carfilzomib (KYPROLIS) 120 mg in dextrose 5 % 100 mL chemo infusion  70 mg/m2 (Treatment Plan Recorded) Intravenous Once Ulysees Barns IV, MD       heparin lock flush 100 unit/mL  500 Units Intracatheter Once PRN Jaci Standard, MD       sodium chloride flush (NS) 0.9 % injection 10 mL  10 mL Intracatheter PRN Jaci Standard, MD        REVIEW OF SYSTEMS:   Constitutional: ( - ) fevers, ( - )  chills , ( - ) night sweats Eyes: ( - ) blurriness of vision, ( - ) double vision, ( - ) watery eyes Ears, nose, mouth, throat, and face: ( - ) mucositis, ( - ) sore throat Respiratory: ( - ) cough, ( - ) dyspnea, ( - ) wheezes Cardiovascular: ( - ) palpitation, ( - ) chest discomfort, ( - ) lower extremity swelling Gastrointestinal:  ( - ) nausea, ( - ) heartburn, ( - ) change in bowel habits Skin: ( - ) abnormal skin rashes Lymphatics: ( - ) new lymphadenopathy, ( - ) easy bruising Neurological: ( +) numbness, ( - ) tingling, ( - ) new weaknesses Behavioral/Psych: ( - ) mood change, ( - ) new changes  All other systems were reviewed with the patient and are negative.  PHYSICAL EXAMINATION: ECOG PERFORMANCE STATUS: 1 - Symptomatic but completely ambulatory  Vitals:   11/06/23 1329  BP: (!) 142/69  Pulse: (!) 103  Resp: 17  Temp: 98.6 F (37 C)  SpO2: 100%      Filed Weights   11/06/23 1329  Weight: 139 lb 14.4 oz (63.5 kg)    GENERAL: well appearing elderly Caucasian female in NAD  SKIN: skin  color, texture, turgor are normal, no rashes or significant lesions EYES: conjunctiva are pink and non-injected, sclera clear LUNGS: clear to auscultation and percussion  with normal breathing effort HEART: regular rate & rhythm and no murmurs and trace lower extremity edema Musculoskeletal: no cyanosis of digits and no clubbing  PSYCH: alert & oriented x 3, fluent speech NEURO: no focal motor/sensory deficits  LABORATORY DATA:  I have reviewed the data as listed    Latest Ref Rng & Units 11/06/2023   12:54 PM 11/02/2023   12:30 PM 10/23/2023    8:29 AM  CBC  WBC 4.0 - 10.5 K/uL 6.3  6.4  8.7   Hemoglobin 12.0 - 15.0 g/dL 16.1  09.6  04.5   Hematocrit 36.0 - 46.0 % 32.7  35.6  35.9   Platelets 150 - 400 K/uL 335  272  165        Latest Ref Rng & Units 11/06/2023   12:54 PM 11/02/2023   12:30 PM 10/23/2023    8:29 AM  CMP  Glucose 70 - 99 mg/dL 409  811  95   BUN 8 - 23 mg/dL 22  16  21    Creatinine 0.44 - 1.00 mg/dL 9.14  7.82  9.56   Sodium 135 - 145 mmol/L 138  137  138   Potassium 3.5 - 5.1 mmol/L 3.8  3.6  4.2   Chloride 98 - 111 mmol/L 107  105  107   CO2 22 - 32 mmol/L 23  25  25    Calcium 8.9 - 10.3 mg/dL 9.4  9.7  9.7   Total Protein 6.5 - 8.1 g/dL 6.3  6.2  6.1   Total Bilirubin <1.2 mg/dL 0.6  0.7  0.9   Alkaline Phos 38 - 126 U/L 57  57  58   AST 15 - 41 U/L 15  16  16    ALT 0 - 44 U/L 15  13  15      Lab Results  Component Value Date   MPROTEIN 0.1 (H) 10/23/2023   MPROTEIN 0.1 (H) 10/08/2023   MPROTEIN 0.2 (H) 09/25/2023   Lab Results  Component Value Date   KPAFRELGTCHN 4.9 10/23/2023   KPAFRELGTCHN 3.7 10/08/2023   KPAFRELGTCHN 4.2 09/25/2023   LAMBDASER 45.7 (H) 10/23/2023   LAMBDASER 116.7 (H) 10/08/2023   LAMBDASER 128.3 (H) 09/25/2023   KAPLAMBRATIO 0.11 (L) 10/23/2023   KAPLAMBRATIO 0.03 (L) 10/08/2023   KAPLAMBRATIO 0.03 (L) 09/25/2023    RADIOGRAPHIC STUDIES: DG Chest 2 View Result Date: 11/02/2023 CLINICAL DATA:  Fever. EXAM: CHEST - 2 VIEW COMPARISON:  06/20/2011. FINDINGS: Trachea is midline. Heart size normal. Right IJ Port-A-Cath tip is in the SVC. Lungs are hyperinflated. Biapical pleuroparenchymal  scarring. No airspace consolidation or pleural fluid. IMPRESSION: Hyperinflation without acute finding. Electronically Signed   By: Leanna Battles M.D.   On: 11/02/2023 15:32    ASSESSMENT & PLAN Doris Wilkerson is a 77 y.o. female with medical history significant for lambda light chain multiple myeloma who presents for a follow up visit. Ms. Heo is not a candidate for bone marrow transplant based on her advanced age.    # Lambda Light Chain Multiple Myeloma--VGPR on maintenance --diagnosis confirmed with bone marrow biopsy and urine protein analysis.   --patient has good functional status and would be an excellent candidate for VRd chemotherapy. I do not believe she would be a bone marrow transplant candidate based on her age.  --Received VRD chemotherapy from 02/01/2021-07/12/2021 then started maintenance  Revlimid on 07/19/2021.  --Due to rising lambda light chain, recommended to switch to Daratumumab/Pomalyst/Dexamethasone. --Received Dara/Pom/Dex from 03/27/2023-08/28/2023. --Due to rising lambda light chain, recommend to switch to Kyprolis/Dex 3 weeks on 1 week off, starting today. Plan: --Due to start Cycle 3, Day 1 of [place]/Dex today --Labs today show white blood cell count 6.3, hemoglobin 1.1, MCV 93.2, platelets 335.  Creatinine and LFTs are within normal limits. Myeloma labs pending today.  --Plan to obtain myeloma labs every cycle.  --Proceed with treatment today without any dose modifications.  --Return to clinic weekly for labs/treatment and follow up visit in 2 weeks  #Hypoglycemia: --Glucose was 225 today.  Notified patient that her blood sugars were high.  She reports that she ate Carn[employer] instant breakfast this morning and thinks that may have caused her blood sugars to spike.  She that she will monitor her blood sugars more closely. --Patient is asymptomatic today. Encouraged to eat right before treatment and snacks every 2-3 hours.  --Patient will check her glucose levels  on non-treatment days and follow up with PCP if non-fasting glucose is consistently 70 or below.  #Back Pain/Left Hip Pain  -- Currently well controlled on gabapentin and Tylenol --CT thoracic and lumbar spine showed no lytic lesions or sequelae of multiple myeloma --MRI thoracic and lumbar spine from 07/07/2022 for baseline imaging. Findings showed multiple bone lesions involving posterior T9 and T8, lumbar spine and upper sacrum consistent with multiple myeloma. No extension into the canal. No pathologic fracture.  --continue follow up with Dr .Donalee Citrin, neurosugery --Continue to monitor  #Supportive Care --chemotherapy education complete  --port placement complete on 09/09/2023. EMLA cream sent today.  --zofran 8mg  q8H PRN and compazine 10mg  PO q6H for nausea -- acyclovir 400mg  PO BID for VCZ prophylaxis --2 year of zometa therapy completed in July 2024.  -- no prescription pain medication required at this time.   Orders Placed This Encounter  Procedures   Multiple Myeloma Panel (SPEP&IFE w/QIG)    Standing Status:   Future    Expected Date:   01/01/2024    Expiration Date:   12/31/2024   Kappa/lambda light chains    Standing Status:   Future    Expected Date:   01/01/2024    Expiration Date:   12/31/2024   CBC with Differential (Cancer Center Only)    Standing Status:   Future    Expected Date:   01/01/2024    Expiration Date:   12/31/2024   CMP (Cancer Center only)    Standing Status:   Future    Expected Date:   01/01/2024    Expiration Date:   12/31/2024   CBC with Differential (Cancer Center Only)    Standing Status:   Future    Expected Date:   01/08/2024    Expiration Date:   01/07/2025   CMP (Cancer Center only)    Standing Status:   Future    Expected Date:   01/08/2024    Expiration Date:   01/07/2025   Multiple Myeloma Panel (SPEP&IFE w/QIG)    Standing Status:   Future    Expected Date:   01/15/2024    Expiration Date:   01/14/2025   Kappa/lambda light chains    Standing  Status:   Future    Expected Date:   01/15/2024    Expiration Date:   01/14/2025   CBC with Differential (Cancer Center Only)    Standing Status:   Future    Expected Date:  01/15/2024    Expiration Date:   01/14/2025   CMP (Cancer Center only)    Standing Status:   Future    Expected Date:   01/15/2024    Expiration Date:   01/14/2025   Multiple Myeloma Panel (SPEP&IFE w/QIG)    Standing Status:   Future    Expected Date:   01/29/2024    Expiration Date:   01/28/2025   Kappa/lambda light chains    Standing Status:   Future    Expected Date:   01/29/2024    Expiration Date:   01/28/2025   CBC with Differential (Cancer Center Only)    Standing Status:   Future    Expected Date:   01/29/2024    Expiration Date:   01/28/2025   CMP (Cancer Center only)    Standing Status:   Future    Expected Date:   01/29/2024    Expiration Date:   01/28/2025   CBC with Differential (Cancer Center Only)    Standing Status:   Future    Expected Date:   02/05/2024    Expiration Date:   02/04/2025   CMP (Cancer Center only)    Standing Status:   Future    Expected Date:   02/05/2024    Expiration Date:   02/04/2025   Multiple Myeloma Panel (SPEP&IFE w/QIG)    Standing Status:   Future    Expected Date:   02/12/2024    Expiration Date:   02/11/2025   Kappa/lambda light chains    Standing Status:   Future    Expected Date:   02/12/2024    Expiration Date:   02/11/2025   CBC with Differential (Cancer Center Only)    Standing Status:   Future    Expected Date:   02/12/2024    Expiration Date:   02/11/2025   CMP (Cancer Center only)    Standing Status:   Future    Expected Date:   02/12/2024    Expiration Date:   02/11/2025   All questions were answered. The patient knows to call the clinic with any problems, questions or concerns.  I have spent a total of 30 minutes minutes of face-to-face and non-face-to-face time, preparing to see the patient, performing a medically appropriate examination, counseling and educating the  patient, documenting clinical information in the electronic health record.   Ulysees Barns, MD Department of Hematology/Oncology [facility] Phone: [phone] Pager: [phone] Email: [email or web address]    11/06/2023 2:47 PM

## 2023-11-06 NOTE — Progress Notes (Signed)
Patient took steroid at home at 0600.

## 2023-11-07 LAB — CULTURE, BLOOD (ROUTINE X 2)
Culture: NO GROWTH
Culture: NO GROWTH

## 2023-11-08 ENCOUNTER — Encounter: Payer: Self-pay | Admitting: Hematology and Oncology

## 2023-11-09 ENCOUNTER — Ambulatory Visit: Payer: Self-pay | Admitting: Cardiology

## 2023-11-09 LAB — KAPPA/LAMBDA LIGHT CHAINS
Kappa free light chain: 3.3 mg/L (ref 3.3–19.4)
Kappa, lambda light chain ratio: 0.09 — ABNORMAL LOW (ref 0.26–1.65)
Lambda free light chains: 37.8 mg/L — ABNORMAL HIGH (ref 5.7–26.3)

## 2023-11-13 ENCOUNTER — Inpatient Hospital Stay: Payer: Medicare Other

## 2023-11-13 ENCOUNTER — Ambulatory Visit: Payer: Medicare Other

## 2023-11-13 ENCOUNTER — Encounter: Payer: Self-pay | Admitting: "Endocrinology

## 2023-11-13 ENCOUNTER — Other Ambulatory Visit: Payer: Medicare Other

## 2023-11-13 VITALS — BP 134/72 | HR 76 | Temp 98.0°F | Resp 18 | Wt 139.2 lb

## 2023-11-13 DIAGNOSIS — E162 Hypoglycemia, unspecified: Secondary | ICD-10-CM | POA: Diagnosis not present

## 2023-11-13 DIAGNOSIS — C9000 Multiple myeloma not having achieved remission: Secondary | ICD-10-CM

## 2023-11-13 DIAGNOSIS — R509 Fever, unspecified: Secondary | ICD-10-CM | POA: Diagnosis not present

## 2023-11-13 DIAGNOSIS — Z79624 Long term (current) use of inhibitors of nucleotide synthesis: Secondary | ICD-10-CM | POA: Diagnosis not present

## 2023-11-13 DIAGNOSIS — Z7961 Long term (current) use of immunomodulator: Secondary | ICD-10-CM | POA: Diagnosis not present

## 2023-11-13 DIAGNOSIS — Z5112 Encounter for antineoplastic immunotherapy: Secondary | ICD-10-CM | POA: Diagnosis not present

## 2023-11-13 DIAGNOSIS — Z95828 Presence of other vascular implants and grafts: Secondary | ICD-10-CM

## 2023-11-13 LAB — CMP (CANCER CENTER ONLY)
ALT: 17 U/L (ref 0–44)
AST: 16 U/L (ref 15–41)
Albumin: 4.2 g/dL (ref 3.5–5.0)
Alkaline Phosphatase: 57 U/L (ref 38–126)
Anion gap: 5 (ref 5–15)
BUN: 20 mg/dL (ref 8–23)
CO2: 25 mmol/L (ref 22–32)
Calcium: 9.6 mg/dL (ref 8.9–10.3)
Chloride: 108 mmol/L (ref 98–111)
Creatinine: 0.67 mg/dL (ref 0.44–1.00)
GFR, Estimated: >60 mL/min (ref >60)
Glucose, Bld: 105 mg/dL — ABNORMAL HIGH (ref 70–99)
Potassium: 3.9 mmol/L (ref 3.5–5.1)
Sodium: 138 mmol/L (ref 135–145)
Total Bilirubin: 0.7 mg/dL (ref <1.2)
Total Protein: 5.8 g/dL — ABNORMAL LOW (ref 6.5–8.1)

## 2023-11-13 LAB — CBC WITH DIFFERENTIAL (CANCER CENTER ONLY)
Abs Immature Granulocytes: 0.06 10*3/uL (ref 0.00–0.07)
Basophils Absolute: 0 10*3/uL (ref 0.0–0.1)
Basophils Relative: 0 %
Eosinophils Absolute: 0.1 10*3/uL (ref 0.0–0.5)
Eosinophils Relative: 1 %
HCT: 34.6 % — ABNORMAL LOW (ref 36.0–46.0)
Hemoglobin: 11.9 g/dL — ABNORMAL LOW (ref 12.0–15.0)
Immature Granulocytes: 1 %
Lymphocytes Relative: 5 %
Lymphs Abs: 0.4 10*3/uL — ABNORMAL LOW (ref 0.7–4.0)
MCH: 31.7 pg (ref 26.0–34.0)
MCHC: 34.4 g/dL (ref 30.0–36.0)
MCV: 92.3 fL (ref 80.0–100.0)
Monocytes Absolute: 0.3 10*3/uL (ref 0.1–1.0)
Monocytes Relative: 5 %
Neutro Abs: 6.3 10*3/uL (ref 1.7–7.7)
Neutrophils Relative %: 88 %
Platelet Count: 171 10*3/uL (ref 150–400)
RBC: 3.75 MIL/uL — ABNORMAL LOW (ref 3.87–5.11)
RDW: 15.4 % (ref 11.5–15.5)
WBC Count: 7.2 10*3/uL (ref 4.0–10.5)
nRBC: 0 % (ref 0.0–0.2)

## 2023-11-13 LAB — MULTIPLE MYELOMA PANEL, SERUM
Albumin SerPl Elph-Mcnc: 3.9 g/dL (ref 2.9–4.4)
Albumin/Glob SerPl: 2.1 — ABNORMAL HIGH (ref 0.7–1.7)
Alpha 1: 0.2 g/dL (ref 0.0–0.4)
Alpha2 Glob SerPl Elph-Mcnc: 0.8 g/dL (ref 0.4–1.0)
B-Globulin SerPl Elph-Mcnc: 0.8 g/dL (ref 0.7–1.3)
Gamma Glob SerPl Elph-Mcnc: 0.2 g/dL — ABNORMAL LOW (ref 0.4–1.8)
Globulin, Total: 1.9 g/dL — ABNORMAL LOW (ref 2.2–3.9)
IgA: 19 mg/dL — ABNORMAL LOW (ref 64–422)
IgG (Immunoglobin G), Serum: 285 mg/dL — ABNORMAL LOW (ref 586–1602)
IgM (Immunoglobulin M), Srm: 6 mg/dL — ABNORMAL LOW (ref 26–217)
M Protein SerPl Elph-Mcnc: 0.1 g/dL — ABNORMAL HIGH
Total Protein ELP: 5.8 g/dL — ABNORMAL LOW (ref 6.0–8.5)

## 2023-11-13 MED ORDER — SODIUM CHLORIDE 0.9 % IV SOLN: Freq: Once | INTRAVENOUS | Status: AC

## 2023-11-13 MED ORDER — SODIUM CHLORIDE 0.9 % IV SOLN: INTRAVENOUS | Status: DC

## 2023-11-13 MED ORDER — CARFILZOMIB CHEMO INJECTION 60 MG
70.0000 mg/m2 | Freq: Once | INTRAVENOUS | Status: AC
  Administered 2023-11-13: 120 mg via INTRAVENOUS
  Filled 2023-11-13: qty 60

## 2023-11-13 MED ORDER — SODIUM CHLORIDE 0.9% FLUSH
10.0000 mL | INTRAVENOUS | Status: DC | PRN
Start: 2023-11-13 — End: 2023-11-13
  Administered 2023-11-13: 10 mL

## 2023-11-13 MED ORDER — SODIUM CHLORIDE 0.9% FLUSH
10.0000 mL | Freq: Once | INTRAVENOUS | Status: AC
  Administered 2023-11-13: 10 mL

## 2023-11-13 MED ORDER — HEPARIN SOD (PORK) LOCK FLUSH 100 UNIT/ML IV SOLN
500.0000 [IU] | Freq: Once | INTRAVENOUS | Status: AC | PRN
Start: 2023-11-13 — End: 2023-11-13
  Administered 2023-11-13: 500 [IU]

## 2023-11-13 NOTE — Progress Notes (Signed)
Pt states she took dexamethasone 40 mg po at 1100 today.

## 2023-11-19 LAB — ACTH STIMULATION, 3 SPECIMENS
TIME 1: 256
TIME 3: 404
Time 2: 332

## 2023-11-19 LAB — CORTISOL, 3 SPECIMEN
SPECIMEN 2: 29 ug/dL
SPECIMEN 3: 31.4 ug/dL
Specimen: 6.5 ug/dL
TIME 1: 256
TIME 3: 404
Time 2: 332

## 2023-11-20 ENCOUNTER — Inpatient Hospital Stay: Payer: Medicare Other

## 2023-11-20 ENCOUNTER — Inpatient Hospital Stay (HOSPITAL_BASED_OUTPATIENT_CLINIC_OR_DEPARTMENT_OTHER): Payer: Medicare Other | Admitting: Hematology and Oncology

## 2023-11-20 VITALS — BP 113/82 | HR 73 | Temp 97.3°F | Resp 17 | Ht 66.0 in | Wt 138.6 lb

## 2023-11-20 DIAGNOSIS — Z7961 Long term (current) use of immunomodulator: Secondary | ICD-10-CM | POA: Diagnosis not present

## 2023-11-20 DIAGNOSIS — Z95828 Presence of other vascular implants and grafts: Secondary | ICD-10-CM

## 2023-11-20 DIAGNOSIS — Z79624 Long term (current) use of inhibitors of nucleotide synthesis: Secondary | ICD-10-CM | POA: Diagnosis not present

## 2023-11-20 DIAGNOSIS — E162 Hypoglycemia, unspecified: Secondary | ICD-10-CM | POA: Diagnosis not present

## 2023-11-20 DIAGNOSIS — M898X9 Other specified disorders of bone, unspecified site: Secondary | ICD-10-CM

## 2023-11-20 DIAGNOSIS — C9000 Multiple myeloma not having achieved remission: Secondary | ICD-10-CM

## 2023-11-20 DIAGNOSIS — Z5112 Encounter for antineoplastic immunotherapy: Secondary | ICD-10-CM | POA: Diagnosis not present

## 2023-11-20 DIAGNOSIS — R509 Fever, unspecified: Secondary | ICD-10-CM | POA: Diagnosis not present

## 2023-11-20 DIAGNOSIS — M899 Disorder of bone, unspecified: Secondary | ICD-10-CM | POA: Diagnosis not present

## 2023-11-20 LAB — CBC WITH DIFFERENTIAL (CANCER CENTER ONLY)
Abs Immature Granulocytes: 0.02 10*3/uL (ref 0.00–0.07)
Basophils Absolute: 0 10*3/uL (ref 0.0–0.1)
Basophils Relative: 0 %
Eosinophils Absolute: 0.1 10*3/uL (ref 0.0–0.5)
Eosinophils Relative: 2 %
HCT: 34.4 % — ABNORMAL LOW (ref 36.0–46.0)
Hemoglobin: 11.4 g/dL — ABNORMAL LOW (ref 12.0–15.0)
Immature Granulocytes: 0 %
Lymphocytes Relative: 7 %
Lymphs Abs: 0.4 10*3/uL — ABNORMAL LOW (ref 0.7–4.0)
MCH: 30.9 pg (ref 26.0–34.0)
MCHC: 33.1 g/dL (ref 30.0–36.0)
MCV: 93.2 fL (ref 80.0–100.0)
Monocytes Absolute: 0.4 10*3/uL (ref 0.1–1.0)
Monocytes Relative: 6 %
Neutro Abs: 4.8 10*3/uL (ref 1.7–7.7)
Neutrophils Relative %: 85 %
Platelet Count: 178 10*3/uL (ref 150–400)
RBC: 3.69 MIL/uL — ABNORMAL LOW (ref 3.87–5.11)
RDW: 15.4 % (ref 11.5–15.5)
WBC Count: 5.7 10*3/uL (ref 4.0–10.5)
nRBC: 0 % (ref 0.0–0.2)

## 2023-11-20 LAB — CMP (CANCER CENTER ONLY)
ALT: 15 U/L (ref 0–44)
AST: 16 U/L (ref 15–41)
Albumin: 4 g/dL (ref 3.5–5.0)
Alkaline Phosphatase: 51 U/L (ref 38–126)
Anion gap: 6 (ref 5–15)
BUN: 24 mg/dL — ABNORMAL HIGH (ref 8–23)
CO2: 25 mmol/L (ref 22–32)
Calcium: 9.9 mg/dL (ref 8.9–10.3)
Chloride: 107 mmol/L (ref 98–111)
Creatinine: 0.69 mg/dL (ref 0.44–1.00)
GFR, Estimated: >60 mL/min (ref >60)
Glucose, Bld: 105 mg/dL — ABNORMAL HIGH (ref 70–99)
Potassium: 4 mmol/L (ref 3.5–5.1)
Sodium: 138 mmol/L (ref 135–145)
Total Bilirubin: 0.9 mg/dL (ref <1.2)
Total Protein: 5.9 g/dL — ABNORMAL LOW (ref 6.5–8.1)

## 2023-11-20 MED ORDER — SODIUM CHLORIDE 0.9% FLUSH
10.0000 mL | INTRAVENOUS | Status: DC | PRN
Start: 2023-11-20 — End: 2023-11-20
  Administered 2023-11-20: 10 mL

## 2023-11-20 MED ORDER — DEXTROSE 5 % IV SOLN
70.0000 mg/m2 | Freq: Once | INTRAVENOUS | Status: AC
Start: 2023-11-20 — End: 2023-11-20
  Administered 2023-11-20: 120 mg via INTRAVENOUS
  Filled 2023-11-20: qty 60

## 2023-11-20 MED ORDER — SODIUM CHLORIDE 0.9 % IV SOLN: Freq: Once | INTRAVENOUS | Status: AC

## 2023-11-20 MED ORDER — SODIUM CHLORIDE 0.9% FLUSH
10.0000 mL | Freq: Once | INTRAVENOUS | Status: AC
  Administered 2023-11-20: 10 mL

## 2023-11-20 MED ORDER — HEPARIN SOD (PORK) LOCK FLUSH 100 UNIT/ML IV SOLN
500.0000 [IU] | Freq: Once | INTRAVENOUS | Status: AC | PRN
  Administered 2023-11-20: 500 [IU]

## 2023-11-20 MED ORDER — SODIUM CHLORIDE 0.9 % IV SOLN
INTRAVENOUS | Status: DC
Start: 2023-11-20 — End: 2023-11-20

## 2023-11-20 NOTE — Progress Notes (Signed)
[facility] Telephone:([phone]) [phone]   Fax:([phone]) [phone]  PROGRESS NOTE  Patient Care Team: Shade Flood, MD as PCP - General (Family Medicine) Yates Decamp, MD as Consulting Physician (Cardiology) Marzella Schlein., MD (Ophthalmology)  Hematological/Oncological History # Lambda Light Chain Multiple Myeloma 12/26/2020: MRI of pelvis showed lytic lesions on L4, L5, the sacrum, with pathologic fracture of left inferior pubic ramus. Concerning for multiple myeloma vs metastatic disease 12/31/2020: establish care with Dr. Leonides Schanz. UPEP showed marked Bence Jones protein, findings concerning for multiple myleoma 01/21/2021: bone marrow biopsy confirms multiple myeloma with atypical plasma cells representing 28% of all cells in the aspirate  02/01/2021: Cycle 1 Day 1 of VRd chemotherapy 02/22/2021: Cycle 2 Day 1 of VRd chemotherapy 03/15/2021: Cycle 3 Day 1 of VRd chemotherapy 04/05/2021: Cycle 4 Day 1 of VRd chemotherapy 04/26/2021: Cycle 5 Day 1 of VRd chemotherapy 05/17/2021: Cycle 6 Day 1 of VRd chemotherapy 06/07/2021:  Cycle 7 Day 1 of VRd chemotherapy 06/28/2021: Cycle 8 Day 1 of VRd chemotherapy 07/19/2021: start maintenance revlimid 10mg  PO daily.  03/26/2022: Cycle 1 Day 1 of Dara/Pom/Dex 04/24/2023: Cycle 2 Day 1 of Dara/Pom/Dex 05/22/2023: Cycle 3 Day 1 of Dara/Pom/Dex 06/19/2023: Cycle 4 Day 1 of Dara/Pom/Dex 07/17/2023: Cycle 5 Day 1 of Dara/Pom/Dex 08/28/2023: d/c Dara/Pom/Dex due to progression.  09/11/2023: Cycle 1 Day 1 of Kyprolis/Dex 10/08/2023: Cycle 2 Day 1 of Kyprolis/Dex 11/06/2023: Cycle 3 Day 1 of Kyprolis/Dex  Interval History:  Doris Wilkerson 77 y.o. female with medical history significant for lambda light chain multiple myeloma who presents for a follow up visit. She was last seen on 11/06/2023. In the interim has continued Kyprolis/Dex today. She is due for Cycle 3, Day 15 today.   On exam today Doris Wilkerson reports she has been doing well overall in the interim  since her last visit.  She is tolerating her Kyprolis therapy well with no major side effects.  She did have 1 episode of chest pain and increased blood pressure for which she took a nitroglycerin pill.  She reports that she follows with cardiology and Dr. Jacinto Halim who recommended that she take nitro.  She reports that otherwise she has had no recent runny nose, sore throat, cough.  She denies any nausea, vomiting, or diarrhea. She denies fevers, chills, night sweats, shortness of breath, chest pain or cough. She has no other complaints. A full 10 point ROS is listed below.    MEDICAL HISTORY:  Past Medical History:  Diagnosis Date   Asthma    Cataract    GERD (gastroesophageal reflux disease)    Hyperlipidemia    Hypertension    Thyroid disease     SURGICAL HISTORY: Past Surgical History:  Procedure Laterality Date   Cataract Surgery     CORONARY ANGIOPLASTY WITH STENT PLACEMENT     EYE SURGERY     IR IMAGING GUIDED PORT INSERTION  09/09/2023   ORTHOPEDIC SURGERY  2012   TONSILLECTOMY  1951    SOCIAL HISTORY: Social History   Socioeconomic History   Marital status: Married    Spouse name: Not on file   Number of children: 0   Years of education: Not on file   Highest education level: Master's degree (e.g., MA, MS, MEng, MEd, MSW, MBA)  Occupational History   Occupation: unemployed  Tobacco Use   Smoking status: Never   Smokeless tobacco: Never  Vaping Use   Vaping status: Never Used  Substance and Sexual Activity   Alcohol  use: Yes    Alcohol/week: 1.0 standard drink of alcohol    Types: 1 Glasses of wine per week    Comment: occassionally   Drug use: No   Sexual activity: Not Currently  Other Topics Concern   Not on file  Social History Narrative   Married. Education: college. Pt does exercise-   Social Drivers of Health   Financial Resource Strain: Low Risk  (04/24/2023)   Overall Financial Resource Strain (CARDIA)    Difficulty of Paying Living Expenses: Not  hard at all  Food Insecurity: No Food Insecurity (04/24/2023)   Hunger Vital Sign    Worried About Running Out of Food in the Last Year: Never true    Ran Out of Food in the Last Year: Never true  Transportation Needs: No Transportation Needs (04/24/2023)   PRAPARE - Administrator, Civil Service (Medical): No    Lack of Transportation (Non-Medical): No  Physical Activity: Insufficiently Active (04/24/2023)   Exercise Vital Sign    Days of Exercise per Week: 7 days    Minutes of Exercise per Session: 20 min  Stress: No Stress Concern Present (04/24/2023)   [employer] of Occupational Health - Occupational Stress Questionnaire    Feeling of Stress : Only a little  Social Connections: Moderately Integrated (04/24/2023)   Social Connection and Isolation Panel [NHANES]    Frequency of Communication with Friends and Family: Twice a week    Frequency of Social Gatherings with Friends and Family: Once a week    Attends Religious Services: Never    Database administrator or Organizations: Yes    Attends Engineer, structural: More than 4 times per year    Marital Status: Married  Catering manager Violence: Not At Risk (04/08/2023)   Humiliation, Afraid, Rape, and Kick questionnaire    Fear of Current or Ex-Partner: No    Emotionally Abused: No    Physically Abused: No    Sexually Abused: No    FAMILY HISTORY: Family History  Problem Relation Age of Onset   Cancer Father    Heart disease Brother    Dementia Brother    Leukemia Brother    Breast cancer Neg Hx    Colon cancer Neg Hx    Esophageal cancer Neg Hx    Pancreatic cancer Neg Hx    Rectal cancer Neg Hx    Stomach cancer Neg Hx     ALLERGIES:  is allergic to poison ivy extract, plavix [clopidogrel bisulfate], and other.  MEDICATIONS:  Current Outpatient Medications  Medication Sig Dispense Refill   famotidine (PEPCID) 20 MG tablet Take 20 mg by mouth daily.     acyclovir (ZOVIRAX) 400 MG tablet  Take 1 tablet (400 mg total) by mouth 2 (two) times daily. 180 tablet 2   amLODipine (NORVASC) 5 MG tablet Take 1 tablet (5 mg total) by mouth daily. 90 tablet 1   aspirin 81 MG tablet Take 81 mg by mouth daily.      atorvastatin (LIPITOR) 80 MG tablet TAKE 1 TABLET(80 MG) BY MOUTH DAILY AT 6 PM 90 tablet 1   benazepril (LOTENSIN) 10 MG tablet TAKE 1 TABLET(10 MG) BY MOUTH DAILY 90 tablet 1   Blood Glucose Monitoring Suppl DEVI 1 each by Does not apply route as needed (symptoms of low blood sugar.). May substitute to any manufacturer covered by patient's insurance. 1 each 0   carfilzomib (KYPROLIS) 10 MG SOLR Inject 70 mg into the vein once  a week.     cholecalciferol (VITAMIN D3) 25 MCG (1000 UNIT) tablet Take 2,000 Units by mouth daily.     Continuous Glucose Sensor (FREESTYLE LIBRE 3 SENSOR) MISC 1 Device by Does not apply route continuous. Place 1 sensor on the skin every 14 days. Use to check glucose continuously 2 each 11   dexamethasone (DECADRON) 4 MG tablet Take 10 tablets (40 mg total) by mouth once a week. 30 tablet 5   gabapentin (NEURONTIN) 100 MG capsule TAKE 1 CAPSULE(100 MG) BY MOUTH TWICE DAILY AS NEEDED 180 capsule 1   levothyroxine (SYNTHROID) 25 MCG tablet TAKE 1 TABLET(25 MCG) BY MOUTH DAILY BEFORE BREAKFAST 90 tablet 3   lidocaine-prilocaine (EMLA) cream Apply 1 Application topically as needed. 30 g 0   Magnesium 100 MG TABS Take 100 mg by mouth daily.     nitroGLYCERIN (NITROSTAT) 0.4 MG SL tablet Place 1 tablet (0.4 mg total) under the tongue every 5 (five) minutes as needed for chest pain. 25 tablet 1   polyethylene glycol (MIRALAX / GLYCOLAX) 17 g packet Take 17 g by mouth daily as needed.     No current facility-administered medications for this visit.   Facility-Administered Medications Ordered in Other Visits  Medication Dose Route Frequency Provider Last Rate Last Admin   0.9 %  sodium chloride infusion   Intravenous Continuous Jaci Standard, MD   Stopped at  11/20/23 1235   sodium chloride flush (NS) 0.9 % injection 10 mL  10 mL Intracatheter PRN Jaci Standard, MD   10 mL at 11/20/23 1225    REVIEW OF SYSTEMS:   Constitutional: ( - ) fevers, ( - )  chills , ( - ) night sweats Eyes: ( - ) blurriness of vision, ( - ) double vision, ( - ) watery eyes Ears, nose, mouth, throat, and face: ( - ) mucositis, ( - ) sore throat Respiratory: ( - ) cough, ( - ) dyspnea, ( - ) wheezes Cardiovascular: ( - ) palpitation, ( - ) chest discomfort, ( - ) lower extremity swelling Gastrointestinal:  ( - ) nausea, ( - ) heartburn, ( - ) change in bowel habits Skin: ( - ) abnormal skin rashes Lymphatics: ( - ) new lymphadenopathy, ( - ) easy bruising Neurological: ( +) numbness, ( - ) tingling, ( - ) new weaknesses Behavioral/Psych: ( - ) mood change, ( - ) new changes  All other systems were reviewed with the patient and are negative.  PHYSICAL EXAMINATION: ECOG PERFORMANCE STATUS: 1 - Symptomatic but completely ambulatory  Vitals:   11/20/23 1000  BP: 113/82  Pulse: 73  Resp: 17  Temp: (!) 97.3 F (36.3 C)  SpO2: 100%      Filed Weights   11/20/23 1000  Weight: 138 lb 9.6 oz (62.9 kg)    GENERAL: well appearing elderly Caucasian female in NAD  SKIN: skin color, texture, turgor are normal, no rashes or significant lesions EYES: conjunctiva are pink and non-injected, sclera clear LUNGS: clear to auscultation and percussion with normal breathing effort HEART: regular rate & rhythm and no murmurs and trace lower extremity edema Musculoskeletal: no cyanosis of digits and no clubbing  PSYCH: alert & oriented x 3, fluent speech NEURO: no focal motor/sensory deficits  LABORATORY DATA:  I have reviewed the data as listed    Latest Ref Rng & Units 11/20/2023    9:38 AM 11/13/2023    1:51 PM 11/06/2023   12:54 PM  CBC  WBC 4.0 - 10.5 K/uL 5.7  7.2  6.3   Hemoglobin 12.0 - 15.0 g/dL 16.1  09.6  04.5   Hematocrit 36.0 - 46.0 % 34.4  34.6  32.7    Platelets 150 - 400 K/uL 178  171  335        Latest Ref Rng & Units 11/20/2023    9:38 AM 11/13/2023    1:51 PM 11/06/2023   12:54 PM  CMP  Glucose 70 - 99 mg/dL 409  811  914   BUN 8 - 23 mg/dL 24  20  22    Creatinine 0.44 - 1.00 mg/dL 7.82  9.56  2.13   Sodium 135 - 145 mmol/L 138  138  138   Potassium 3.5 - 5.1 mmol/L 4.0  3.9  3.8   Chloride 98 - 111 mmol/L 107  108  107   CO2 22 - 32 mmol/L 25  25  23    Calcium 8.9 - 10.3 mg/dL 9.9  9.6  9.4   Total Protein 6.5 - 8.1 g/dL 5.9  5.8  6.3   Total Bilirubin <1.2 mg/dL 0.9  0.7  0.6   Alkaline Phos 38 - 126 U/L 51  57  57   AST 15 - 41 U/L 16  16  15    ALT 0 - 44 U/L 15  17  15      Lab Results  Component Value Date   MPROTEIN 0.1 (H) 11/06/2023   MPROTEIN 0.1 (H) 10/23/2023   MPROTEIN 0.1 (H) 10/08/2023   Lab Results  Component Value Date   KPAFRELGTCHN 3.3 11/06/2023   KPAFRELGTCHN 4.9 10/23/2023   KPAFRELGTCHN 3.7 10/08/2023   LAMBDASER 37.8 (H) 11/06/2023   LAMBDASER 45.7 (H) 10/23/2023   LAMBDASER 116.7 (H) 10/08/2023   KAPLAMBRATIO 0.09 (L) 11/06/2023   KAPLAMBRATIO 0.11 (L) 10/23/2023   KAPLAMBRATIO 0.03 (L) 10/08/2023    RADIOGRAPHIC STUDIES: DG Chest 2 View Result Date: 11/02/2023 CLINICAL DATA:  Fever. EXAM: CHEST - 2 VIEW COMPARISON:  06/20/2011. FINDINGS: Trachea is midline. Heart size normal. Right IJ Port-A-Cath tip is in the SVC. Lungs are hyperinflated. Biapical pleuroparenchymal scarring. No airspace consolidation or pleural fluid. IMPRESSION: Hyperinflation without acute finding. Electronically Signed   By: Leanna Battles M.D.   On: 11/02/2023 15:32    ASSESSMENT & PLAN Doris Wilkerson is a 77 y.o. female with medical history significant for lambda light chain multiple myeloma who presents for a follow up visit. Ms. Khera is not a candidate for bone marrow transplant based on her advanced age.    # Lambda Light Chain Multiple Myeloma--VGPR on maintenance --diagnosis confirmed with bone marrow  biopsy and urine protein analysis.   --patient has good functional status and would be an excellent candidate for VRd chemotherapy. I do not believe she would be a bone marrow transplant candidate based on her age.  --Received VRD chemotherapy from 02/01/2021-07/12/2021 then started maintenance Revlimid on 07/19/2021.  --Due to rising lambda light chain, recommended to switch to Daratumumab/Pomalyst/Dexamethasone. --Received Dara/Pom/Dex from 03/27/2023-08/28/2023. --Due to rising lambda light chain, recommend to switch to Kyprolis/Dex 3 weeks on 1 week off, starting today. Plan: --Due to start Cycle 3, Day 15 of [place]/Dex today --Labs today show white blood cell count 5.7, Hgb 11.4, MCV 93.2, Plt 178. Creatinine and LFTs are within normal limits. Myeloma labs pending today.  --Plan to obtain myeloma labs every cycle.  --Proceed with treatment today without any dose modifications.  --Return to clinic weekly for labs/treatment and follow  up visit in 2 weeks  #Hypoglycemia/Hyperglycemia --Glucose was 105 today.  Notified patient that her blood sugars were high.  She reports that she ate Carnation instant breakfast this morning and thinks that may have caused her blood sugars to spike.  She that she will monitor her blood sugars more closely. --Patient is asymptomatic today. Encouraged to eat right before treatment and snacks every 2-3 hours.  --Patient will check her glucose levels on non-treatment days and follow up with PCP if non-fasting glucose is consistently 70 or below.  #Back Pain/Left Hip Pain  -- Currently well controlled on gabapentin and Tylenol --CT thoracic and lumbar spine showed no lytic lesions or sequelae of multiple myeloma --MRI thoracic and lumbar spine from 07/07/2022 for baseline imaging. Findings showed multiple bone lesions involving posterior T9 and T8, lumbar spine and upper sacrum consistent with multiple myeloma. No extension into the canal. No pathologic fracture.   --continue follow up with Dr .Donalee Citrin, neurosugery --Continue to monitor  #Supportive Care --chemotherapy education complete  --port placement complete on 09/09/2023. EMLA cream sent today.  --zofran 8mg  q8H PRN and compazine 10mg  PO q6H for nausea -- acyclovir 400mg  PO BID for VCZ prophylaxis --2 year of zometa therapy completed in July 2024.  -- no prescription pain medication required at this time.   No orders of the defined types were placed in this encounter.  All questions were answered. The patient knows to call the clinic with any problems, questions or concerns.  I have spent a total of 30 minutes minutes of face-to-face and non-face-to-face time, preparing to see the patient, performing a medically appropriate examination, counseling and educating the patient, documenting clinical information in the electronic health record.   Ulysees Barns, MD Department of Hematology/Oncology [facility] Phone: [phone] Pager: [phone] Email: [email or web address]  11/20/2023 1:28 PM

## 2023-11-20 NOTE — Patient Instructions (Signed)
 [facility] - A DEPT OF [facility]  Discharge Instructions: Thank you for choosing [facility] to provide your oncology and hematology care.   If you have a lab appointment with the Cancer Center, please go directly to the Cancer Center and check in at the registration area.   Wear comfortable clothing and clothing appropriate for easy access to any Portacath or PICC line.   We strive to give you quality time with your provider. You may need to reschedule your appointment if you arrive late (15 or more minutes).  Arriving late affects you and other patients whose appointments are after yours.  Also, if you miss three or more appointments without notifying the office, you may be dismissed from the clinic at the provider's discretion.      For prescription refill requests, have your pharmacy contact our office and allow 72 hours for refills to be completed.    Today you received the following chemotherapy and/or immunotherapy agents: Kyprolis      To help prevent nausea and vomiting after your treatment, we encourage you to take your nausea medication as directed.  BELOW ARE SYMPTOMS THAT SHOULD BE REPORTED IMMEDIATELY: *FEVER GREATER THAN 100.4 F (38 C) OR HIGHER *CHILLS OR SWEATING *NAUSEA AND VOMITING THAT IS NOT CONTROLLED WITH YOUR NAUSEA MEDICATION *UNUSUAL SHORTNESS OF BREATH *UNUSUAL BRUISING OR BLEEDING *URINARY PROBLEMS (pain or burning when urinating, or frequent urination) *BOWEL PROBLEMS (unusual diarrhea, constipation, pain near the anus) TENDERNESS IN MOUTH AND THROAT WITH OR WITHOUT PRESENCE OF ULCERS (sore throat, sores in mouth, or a toothache) UNUSUAL RASH, SWELLING OR PAIN  UNUSUAL VAGINAL DISCHARGE OR ITCHING   Items with * indicate a potential emergency and should be followed up as soon as possible or go to the Emergency Department if any problems should occur.  Please show the CHEMOTHERAPY ALERT CARD or IMMUNOTHERAPY  ALERT CARD at check-in to the Emergency Department and triage nurse.  Should you have questions after your visit or need to cancel or reschedule your appointment, please contact [facility] - A DEPT OF [facility]  Dept: [phone]  and follow the prompts.  Office hours are 8:00 a.m. to 4:30 p.m. Monday - Friday. Please note that voicemails left after 4:00 p.m. may not be returned until the following business day.  We are closed weekends and major holidays. You have access to a nurse at all times for urgent questions. Please call the main number to the clinic Dept: [phone] and follow the prompts.   For any non-urgent questions, you may also contact your provider using MyChart. We now offer e-Visits for anyone 73 and older to request care online for non-urgent symptoms. For details visit [email or web address].   Also download the MyChart app! Go to the app store, search "MyChart", open the app, select [place], and log in with your MyChart username and password.

## 2023-11-20 NOTE — Progress Notes (Signed)
Patient took her 40mg  PO dexamethasone at home this morning.

## 2023-11-23 LAB — KAPPA/LAMBDA LIGHT CHAINS
Kappa free light chain: 4.1 mg/L (ref 3.3–19.4)
Kappa, lambda light chain ratio: 0.08 — ABNORMAL LOW (ref 0.26–1.65)
Lambda free light chains: 48.3 mg/L — ABNORMAL HIGH (ref 5.7–26.3)

## 2023-11-29 LAB — MULTIPLE MYELOMA PANEL, SERUM
Albumin SerPl Elph-Mcnc: 3.8 g/dL (ref 2.9–4.4)
Albumin/Glob SerPl: 2.3 — ABNORMAL HIGH (ref 0.7–1.7)
Alpha 1: 0.1 g/dL (ref 0.0–0.4)
Alpha2 Glob SerPl Elph-Mcnc: 0.6 g/dL (ref 0.4–1.0)
B-Globulin SerPl Elph-Mcnc: 0.8 g/dL (ref 0.7–1.3)
Gamma Glob SerPl Elph-Mcnc: 0.2 g/dL — ABNORMAL LOW (ref 0.4–1.8)
Globulin, Total: 1.7 g/dL — ABNORMAL LOW (ref 2.2–3.9)
IgA: 18 mg/dL — ABNORMAL LOW (ref 64–422)
IgG (Immunoglobin G), Serum: 302 mg/dL — ABNORMAL LOW (ref 586–1602)
IgM (Immunoglobulin M), Srm: 14 mg/dL — ABNORMAL LOW (ref 26–217)
M Protein SerPl Elph-Mcnc: 0.1 g/dL — ABNORMAL HIGH
Total Protein ELP: 5.5 g/dL — ABNORMAL LOW (ref 6.0–8.5)

## 2023-12-04 ENCOUNTER — Inpatient Hospital Stay (HOSPITAL_BASED_OUTPATIENT_CLINIC_OR_DEPARTMENT_OTHER): Payer: Medicare Other | Attending: Physician Assistant | Admitting: Hematology and Oncology

## 2023-12-04 ENCOUNTER — Inpatient Hospital Stay: Payer: Medicare Other | Attending: Physician Assistant

## 2023-12-04 ENCOUNTER — Inpatient Hospital Stay: Payer: Medicare Other

## 2023-12-04 VITALS — BP 137/72 | HR 87 | Temp 97.9°F | Resp 16 | Wt 139.0 lb

## 2023-12-04 DIAGNOSIS — C9000 Multiple myeloma not having achieved remission: Secondary | ICD-10-CM

## 2023-12-04 DIAGNOSIS — Z95828 Presence of other vascular implants and grafts: Secondary | ICD-10-CM | POA: Diagnosis not present

## 2023-12-04 DIAGNOSIS — Z79624 Long term (current) use of inhibitors of nucleotide synthesis: Secondary | ICD-10-CM | POA: Diagnosis not present

## 2023-12-04 DIAGNOSIS — Z79899 Other long term (current) drug therapy: Secondary | ICD-10-CM | POA: Insufficient documentation

## 2023-12-04 DIAGNOSIS — Z7982 Long term (current) use of aspirin: Secondary | ICD-10-CM | POA: Insufficient documentation

## 2023-12-04 DIAGNOSIS — Z5112 Encounter for antineoplastic immunotherapy: Secondary | ICD-10-CM | POA: Insufficient documentation

## 2023-12-04 LAB — CMP (CANCER CENTER ONLY)
ALT: 16 U/L (ref 0–44)
AST: 20 U/L (ref 15–41)
Albumin: 4.4 g/dL (ref 3.5–5.0)
Alkaline Phosphatase: 52 U/L (ref 38–126)
Anion gap: 5 (ref 5–15)
BUN: 21 mg/dL (ref 8–23)
CO2: 26 mmol/L (ref 22–32)
Calcium: 10 mg/dL (ref 8.9–10.3)
Chloride: 107 mmol/L (ref 98–111)
Creatinine: 0.68 mg/dL (ref 0.44–1.00)
GFR, Estimated: >60 mL/min (ref >60)
Glucose, Bld: 106 mg/dL — ABNORMAL HIGH (ref 70–99)
Potassium: 4.1 mmol/L (ref 3.5–5.1)
Sodium: 138 mmol/L (ref 135–145)
Total Bilirubin: 1.2 mg/dL (ref 0.0–1.2)
Total Protein: 6.3 g/dL — ABNORMAL LOW (ref 6.5–8.1)

## 2023-12-04 LAB — CBC WITH DIFFERENTIAL (CANCER CENTER ONLY)
Abs Immature Granulocytes: 0.01 10*3/uL (ref 0.00–0.07)
Basophils Absolute: 0 10*3/uL (ref 0.0–0.1)
Basophils Relative: 1 %
Eosinophils Absolute: 0 10*3/uL (ref 0.0–0.5)
Eosinophils Relative: 1 %
HCT: 35.5 % — ABNORMAL LOW (ref 36.0–46.0)
Hemoglobin: 12 g/dL (ref 12.0–15.0)
Immature Granulocytes: 0 %
Lymphocytes Relative: 5 %
Lymphs Abs: 0.2 10*3/uL — ABNORMAL LOW (ref 0.7–4.0)
MCH: 32.1 pg (ref 26.0–34.0)
MCHC: 33.8 g/dL (ref 30.0–36.0)
MCV: 94.9 fL (ref 80.0–100.0)
Monocytes Absolute: 0.2 10*3/uL (ref 0.1–1.0)
Monocytes Relative: 5 %
Neutro Abs: 4.7 10*3/uL (ref 1.7–7.7)
Neutrophils Relative %: 88 %
Platelet Count: 327 10*3/uL (ref 150–400)
RBC: 3.74 MIL/uL — ABNORMAL LOW (ref 3.87–5.11)
RDW: 14.6 % (ref 11.5–15.5)
WBC Count: 5.3 10*3/uL (ref 4.0–10.5)
nRBC: 0 % (ref 0.0–0.2)

## 2023-12-04 MED ORDER — DEXTROSE 5 % IV SOLN
70.0000 mg/m2 | Freq: Once | INTRAVENOUS | Status: AC
  Administered 2023-12-04: 120 mg via INTRAVENOUS
  Filled 2023-12-04: qty 60

## 2023-12-04 MED ORDER — SODIUM CHLORIDE 0.9% FLUSH
10.0000 mL | Freq: Once | INTRAVENOUS | Status: AC
  Administered 2023-12-04: 10 mL

## 2023-12-04 MED ORDER — SODIUM CHLORIDE 0.9 % IV SOLN: Freq: Once | INTRAVENOUS | Status: AC

## 2023-12-04 NOTE — Progress Notes (Signed)
 Patient took her 40mg  of dexamethasone at home

## 2023-12-04 NOTE — Progress Notes (Signed)
 [facility] [phone]   [phone]  PROGRESS NOTE  Patient Care Team: Levora Reyes SAUNDERS, MD as PCP - General (Family Medicine) Ladona Heinz, MD as Consulting Physician (Cardiology) Debarah Lorrene DEL., MD (Ophthalmology)  Hematological/Oncological History # Lambda Light Chain Multiple Myeloma 12/26/2020: MRI of pelvis showed lytic lesions on L4, L5, the sacrum, with pathologic fracture of left inferior pubic ramus. Concerning for multiple myeloma vs metastatic disease 12/31/2020: establish care with Dr. Federico. UPEP showed marked Bence Jones protein, findings concerning for multiple myleoma 01/21/2021: bone marrow biopsy confirms multiple myeloma with atypical plasma cells representing 28% of all cells in the aspirate  02/01/2021: Cycle 1 Day 1 of VRd chemotherapy 02/22/2021: Cycle 2 Day 1 of VRd chemotherapy 03/15/2021: Cycle 3 Day 1 of VRd chemotherapy 04/05/2021: Cycle 4 Day 1 of VRd chemotherapy 04/26/2021: Cycle 5 Day 1 of VRd chemotherapy 05/17/2021: Cycle 6 Day 1 of VRd chemotherapy 06/07/2021:  Cycle 7 Day 1 of VRd chemotherapy 06/28/2021: Cycle 8 Day 1 of VRd chemotherapy 07/19/2021: start maintenance revlimid  10mg  PO daily.  03/26/2022: Cycle 1 Day 1 of Dara/Pom/Dex 04/24/2023: Cycle 2 Day 1 of Dara/Pom/Dex 05/22/2023: Cycle 3 Day 1 of Dara/Pom/Dex 06/19/2023: Cycle 4 Day 1 of Dara/Pom/Dex 07/17/2023: Cycle 5 Day 1 of Dara/Pom/Dex 08/28/2023: d/c Dara/Pom/Dex due to progression.  09/11/2023: Cycle 1 Day 1 of Kyprolis /Dex 10/08/2023: Cycle 2 Day 1 of Kyprolis /Dex 11/06/2023: Cycle 3 Day 1 of Kyprolis /Dex 12/04/2023: Cycle 4 Day 1 of Kyprolis /Dex  Interval History:  Doris Wilkerson 78 y.o. female with medical history significant for lambda light chain multiple myeloma who presents for a follow up visit. She was last seen on 11/20/2023. In the interim has continued Kyprolis /Dex today. She is due for Cycle 4, Day 1 today.   On exam today Doris Wilkerson reports she has  been well overall with interim since her last visit and is ready for the snowstorm that is upcoming.  She reports overall her health has been good and cycle 3 was wonderful.  She notes that she is however saddened by the fact that her lambda light chains did not decrease as much as she was hoping.  She reports that she has been having some trouble sleeping and in order to help with her bowel movement she has been taking some MiraLAX.  She reports he takes a teaspoon in the a.m. and normally has a bowel movement in the evening.  She reports that she does have some pain behind her right ear on occasion.  The etiology of this is unclear.  She also has some occasional issues with shoulder discomfort.  She notes that she is doing her best to try to ride on her recumbent bike about 30 minutes/day, typically into 15-minute sessions.  She reports that she is not having any issues with neuropathy or other side effects as a result of her treatment.. She reports that otherwise she has had no recent runny nose, sore throat, cough.  She denies any nausea, vomiting, or diarrhea. She denies fevers, chills, night sweats, shortness of breath, chest pain or cough. She has no other complaints. A full 10 point ROS is listed below.    MEDICAL HISTORY:  Past Medical History:  Diagnosis Date   Asthma    Cataract    GERD (gastroesophageal reflux disease)    Hyperlipidemia    Hypertension    Thyroid  disease     SURGICAL HISTORY: Past Surgical History:  Procedure Laterality Date   Cataract Surgery  CORONARY ANGIOPLASTY WITH STENT PLACEMENT     EYE SURGERY     IR IMAGING GUIDED PORT INSERTION  09/09/2023   ORTHOPEDIC SURGERY  2012   TONSILLECTOMY  1951    SOCIAL HISTORY: Social History   Socioeconomic History   Marital status: Married    Spouse name: Not on file   Number of children: 0   Years of education: Not on file   Highest education level: Master's degree (e.g., MA, MS, MEng, MEd, MSW, MBA)   Occupational History   Occupation: unemployed  Tobacco Use   Smoking status: Never   Smokeless tobacco: Never  Vaping Use   Vaping status: Never Used  Substance and Sexual Activity   Alcohol use: Yes    Alcohol/week: 1.0 standard drink of alcohol    Types: 1 Glasses of wine per week    Comment: occassionally   Drug use: No   Sexual activity: Not Currently  Other Topics Concern   Not on file  Social History Narrative   Married. Education: college. Pt does exercise-   Social Drivers of Health   Financial Resource Strain: Low Risk  (04/24/2023)   Overall Financial Resource Strain (CARDIA)    Difficulty of Paying Living Expenses: Not hard at all  Food Insecurity: No Food Insecurity (04/24/2023)   Hunger Vital Sign    Worried About Running Out of Food in the Last Year: Never true    Ran Out of Food in the Last Year: Never true  Transportation Needs: No Transportation Needs (04/24/2023)   PRAPARE - Administrator, Civil Service (Medical): No    Lack of Transportation (Non-Medical): No  Physical Activity: Insufficiently Active (04/24/2023)   Exercise Vital Sign    Days of Exercise per Week: 7 days    Minutes of Exercise per Session: 20 min  Stress: No Stress Concern Present (04/24/2023)   [employer] of Occupational Health - Occupational Stress Questionnaire    Feeling of Stress : Only a little  Social Connections: Moderately Integrated (04/24/2023)   Social Connection and Isolation Panel [NHANES]    Frequency of Communication with Friends and Family: Twice a week    Frequency of Social Gatherings with Friends and Family: Once a week    Attends Religious Services: Never    Database Administrator or Organizations: Yes    Attends Engineer, Structural: More than 4 times per year    Marital Status: Married  Catering Manager Violence: Not At Risk (04/08/2023)   Humiliation, Afraid, Rape, and Kick questionnaire    Fear of Current or Ex-Partner: No     Emotionally Abused: No    Physically Abused: No    Sexually Abused: No    FAMILY HISTORY: Family History  Problem Relation Age of Onset   Cancer Father    Heart disease Brother    Dementia Brother    Leukemia Brother    Breast cancer Neg Hx    Colon cancer Neg Hx    Esophageal cancer Neg Hx    Pancreatic cancer Neg Hx    Rectal cancer Neg Hx    Stomach cancer Neg Hx     ALLERGIES:  is allergic to poison ivy extract, plavix [clopidogrel bisulfate], and other.  MEDICATIONS:  Current Outpatient Medications  Medication Sig Dispense Refill   acyclovir  (ZOVIRAX ) 400 MG tablet Take 1 tablet (400 mg total) by mouth 2 (two) times daily. 180 tablet 2   amLODipine  (NORVASC ) 5 MG tablet Take 1 tablet (5  mg total) by mouth daily. 90 tablet 1   aspirin 81 MG tablet Take 81 mg by mouth daily.      atorvastatin  (LIPITOR) 80 MG tablet TAKE 1 TABLET(80 MG) BY MOUTH DAILY AT 6 PM 90 tablet 1   benazepril  (LOTENSIN ) 10 MG tablet TAKE 1 TABLET(10 MG) BY MOUTH DAILY 90 tablet 1   Blood Glucose Monitoring Suppl DEVI 1 each by Does not apply route as needed (symptoms of low blood sugar.). May substitute to any manufacturer covered by patient's insurance. 1 each 0   carfilzomib  (KYPROLIS ) 10 MG SOLR Inject 70 mg into the vein once a week.     cholecalciferol (VITAMIN D3) 25 MCG (1000 UNIT) tablet Take 2,000 Units by mouth daily.     Continuous Glucose Sensor (FREESTYLE LIBRE 3 SENSOR) MISC 1 Device by Does not apply route continuous. Place 1 sensor on the skin every 14 days. Use to check glucose continuously 2 each 11   dexamethasone  (DECADRON ) 4 MG tablet Take 10 tablets (40 mg total) by mouth once a week. 30 tablet 5   famotidine (PEPCID) 20 MG tablet Take 20 mg by mouth daily.     gabapentin  (NEURONTIN ) 100 MG capsule TAKE 1 CAPSULE(100 MG) BY MOUTH TWICE DAILY AS NEEDED 180 capsule 1   levothyroxine  (SYNTHROID ) 25 MCG tablet TAKE 1 TABLET(25 MCG) BY MOUTH DAILY BEFORE BREAKFAST 90 tablet 3    lidocaine -prilocaine  (EMLA ) cream Apply 1 Application topically as needed. 30 g 0   Magnesium 100 MG TABS Take 100 mg by mouth daily.     nitroGLYCERIN  (NITROSTAT ) 0.4 MG SL tablet Place 1 tablet (0.4 mg total) under the tongue every 5 (five) minutes as needed for chest pain. 25 tablet 1   polyethylene glycol (MIRALAX / GLYCOLAX) 17 g packet Take 17 g by mouth daily as needed.     No current facility-administered medications for this visit.    REVIEW OF SYSTEMS:   Constitutional: ( - ) fevers, ( - )  chills , ( - ) night sweats Eyes: ( - ) blurriness of vision, ( - ) double vision, ( - ) watery eyes Ears, nose, mouth, throat, and face: ( - ) mucositis, ( - ) sore throat Respiratory: ( - ) cough, ( - ) dyspnea, ( - ) wheezes Cardiovascular: ( - ) palpitation, ( - ) chest discomfort, ( - ) lower extremity swelling Gastrointestinal:  ( - ) nausea, ( - ) heartburn, ( - ) change in bowel habits Skin: ( - ) abnormal skin rashes Lymphatics: ( - ) new lymphadenopathy, ( - ) easy bruising Neurological: ( +) numbness, ( - ) tingling, ( - ) new weaknesses Behavioral/Psych: ( - ) mood change, ( - ) new changes  All other systems were reviewed with the patient and are negative.  PHYSICAL EXAMINATION: ECOG PERFORMANCE STATUS: 1 - Symptomatic but completely ambulatory  Vitals:   12/04/23 0950  BP: 137/72  Pulse: 87  Resp: 16  Temp: 97.9 F (36.6 C)  SpO2: 100%       Filed Weights   12/04/23 0950  Weight: 139 lb (63 kg)     GENERAL: well appearing elderly Caucasian female in NAD  SKIN: skin color, texture, turgor are normal, no rashes or significant lesions EYES: conjunctiva are pink and non-injected, sclera clear LUNGS: clear to auscultation and percussion with normal breathing effort HEART: regular rate & rhythm and no murmurs and trace lower extremity edema Musculoskeletal: no cyanosis of digits and no clubbing  PSYCH: alert & oriented x 3, fluent speech NEURO: no focal  motor/sensory deficits  LABORATORY DATA:  I have reviewed the data as listed    Latest Ref Rng & Units 12/04/2023    9:18 AM 11/20/2023    9:38 AM 11/13/2023    1:51 PM  CBC  WBC 4.0 - 10.5 K/uL 5.3  5.7  7.2   Hemoglobin 12.0 - 15.0 g/dL 87.9  88.5  88.0   Hematocrit 36.0 - 46.0 % 35.5  34.4  34.6   Platelets 150 - 400 K/uL 327  178  171        Latest Ref Rng & Units 12/04/2023    9:18 AM 11/20/2023    9:38 AM 11/13/2023    1:51 PM  CMP  Glucose 70 - 99 mg/dL 893  894  894   BUN 8 - 23 mg/dL 21  24  20    Creatinine 0.44 - 1.00 mg/dL 9.31  9.30  9.32   Sodium 135 - 145 mmol/L 138  138  138   Potassium 3.5 - 5.1 mmol/L 4.1  4.0  3.9   Chloride 98 - 111 mmol/L 107  107  108   CO2 22 - 32 mmol/L 26  25  25    Calcium  8.9 - 10.3 mg/dL 89.9  9.9  9.6   Total Protein 6.5 - 8.1 g/dL 6.3  5.9  5.8   Total Bilirubin 0.0 - 1.2 mg/dL 1.2  0.9  0.7   Alkaline Phos 38 - 126 U/L 52  51  57   AST 15 - 41 U/L 20  16  16    ALT 0 - 44 U/L 16  15  17      Lab Results  Component Value Date   MPROTEIN 0.1 (H) 11/20/2023   MPROTEIN 0.1 (H) 11/06/2023   MPROTEIN 0.1 (H) 10/23/2023   Lab Results  Component Value Date   KPAFRELGTCHN 4.1 11/20/2023   KPAFRELGTCHN 3.3 11/06/2023   KPAFRELGTCHN 4.9 10/23/2023   LAMBDASER 48.3 (H) 11/20/2023   LAMBDASER 37.8 (H) 11/06/2023   LAMBDASER 45.7 (H) 10/23/2023   KAPLAMBRATIO 0.08 (L) 11/20/2023   KAPLAMBRATIO 0.09 (L) 11/06/2023   KAPLAMBRATIO 0.11 (L) 10/23/2023    RADIOGRAPHIC STUDIES: No results found.   ASSESSMENT & PLAN Yamileth Hayse is a 78 y.o. female with medical history significant for lambda light chain multiple myeloma who presents for a follow up visit. Doris Wilkerson is not a candidate for bone marrow transplant based on her advanced age.    # Lambda Light Chain Multiple Myeloma--VGPR on maintenance --diagnosis confirmed with bone marrow biopsy and urine protein analysis.   --patient has good functional status and would be an  excellent candidate for VRd chemotherapy. I do not believe she would be a bone marrow transplant candidate based on her age.  --Received VRD chemotherapy from 02/01/2021-07/12/2021 then started maintenance Revlimid  on 07/19/2021.  --Due to rising lambda light chain, recommended to switch to Daratumumab /Pomalyst /Dexamethasone . --Received Dara/Pom/Dex from 03/27/2023-08/28/2023. --Due to rising lambda light chain, recommend to switch to Kyprolis /Dex 3 weeks on 1 week off, starting today. Plan: --Due to start Cycle 4, Day 1 of [place]/Dex today --Labs today show white blood cell count 5.3, hemoglobin 12.0, MCV 94.9, platelets 327. Creatinine and LFTs are within normal limits. Myeloma labs pending today.  --Plan to obtain myeloma labs every cycle.  --Proceed with treatment today without any dose modifications.  --Return to clinic weekly for labs/treatment and follow up visit in 2 weeks  #Hypoglycemia/Hyperglycemia --  Glucose was 106 today.  Notified patient that her blood sugars were high.  She reports that she ate Carnation instant breakfast this morning and thinks that may have caused her blood sugars to spike.  She that she will monitor her blood sugars more closely. --Patient is asymptomatic today. Encouraged to eat right before treatment and snacks every 2-3 hours.  --Patient will check her glucose levels on non-treatment days and follow up with PCP if non-fasting glucose is consistently 70 or below.  #Back Pain/Left Hip Pain  -- Currently well controlled on gabapentin  and Tylenol  --CT thoracic and lumbar spine showed no lytic lesions or sequelae of multiple myeloma --MRI thoracic and lumbar spine from 07/07/2022 for baseline imaging. Findings showed multiple bone lesions involving posterior T9 and T8, lumbar spine and upper sacrum consistent with multiple myeloma. No extension into the canal. No pathologic fracture.  --continue follow up with Dr .Arley Helling, neurosugery --Continue to  monitor  #Supportive Care --chemotherapy education complete  --port placement complete on 09/09/2023. EMLA  cream sent today.  --zofran  8mg  q8H PRN and compazine  10mg  PO q6H for nausea -- acyclovir  400mg  PO BID for VCZ prophylaxis --2 year of zometa  therapy completed in July 2024.  -- no prescription pain medication required at this time.   No orders of the defined types were placed in this encounter.  All questions were answered. The patient knows to call the clinic with any problems, questions or concerns.  I have spent a total of 30 minutes minutes of face-to-face and non-face-to-face time, preparing to see the patient, performing a medically appropriate examination, counseling and educating the patient, documenting clinical information in the electronic health record.   Norleen IVAR Kidney, MD Department of Hematology/Oncology [facility] Phone: [phone] Pager: [phone] Email: [email or web address]  12/06/2023 7:17 PM

## 2023-12-04 NOTE — Patient Instructions (Signed)

## 2023-12-06 ENCOUNTER — Encounter: Payer: Self-pay | Admitting: Hematology and Oncology

## 2023-12-07 LAB — KAPPA/LAMBDA LIGHT CHAINS
Kappa free light chain: 4.2 mg/L (ref 3.3–19.4)
Kappa, lambda light chain ratio: 0.08 — ABNORMAL LOW (ref 0.26–1.65)
Lambda free light chains: 52.7 mg/L — ABNORMAL HIGH (ref 5.7–26.3)

## 2023-12-11 ENCOUNTER — Encounter: Payer: Self-pay | Admitting: Hematology and Oncology

## 2023-12-11 ENCOUNTER — Inpatient Hospital Stay: Payer: Medicare Other

## 2023-12-11 VITALS — BP 140/76 | HR 84 | Temp 98.3°F | Resp 18 | Wt 141.0 lb

## 2023-12-11 DIAGNOSIS — Z7982 Long term (current) use of aspirin: Secondary | ICD-10-CM | POA: Diagnosis not present

## 2023-12-11 DIAGNOSIS — Z79624 Long term (current) use of inhibitors of nucleotide synthesis: Secondary | ICD-10-CM | POA: Diagnosis not present

## 2023-12-11 DIAGNOSIS — C9000 Multiple myeloma not having achieved remission: Secondary | ICD-10-CM

## 2023-12-11 DIAGNOSIS — Z5112 Encounter for antineoplastic immunotherapy: Secondary | ICD-10-CM | POA: Diagnosis not present

## 2023-12-11 DIAGNOSIS — Z79899 Other long term (current) drug therapy: Secondary | ICD-10-CM | POA: Diagnosis not present

## 2023-12-11 DIAGNOSIS — Z95828 Presence of other vascular implants and grafts: Secondary | ICD-10-CM

## 2023-12-11 LAB — MULTIPLE MYELOMA PANEL, SERUM
Albumin SerPl Elph-Mcnc: 4.2 g/dL (ref 2.9–4.4)
Albumin/Glob SerPl: 2.4 — ABNORMAL HIGH (ref 0.7–1.7)
Alpha 1: 0.2 g/dL (ref 0.0–0.4)
Alpha2 Glob SerPl Elph-Mcnc: 0.6 g/dL (ref 0.4–1.0)
B-Globulin SerPl Elph-Mcnc: 0.8 g/dL (ref 0.7–1.3)
Gamma Glob SerPl Elph-Mcnc: 0.2 g/dL — ABNORMAL LOW (ref 0.4–1.8)
Globulin, Total: 1.8 g/dL — ABNORMAL LOW (ref 2.2–3.9)
IgA: 19 mg/dL — ABNORMAL LOW (ref 64–422)
IgG (Immunoglobin G), Serum: 283 mg/dL — ABNORMAL LOW (ref 586–1602)
IgM (Immunoglobulin M), Srm: 11 mg/dL — ABNORMAL LOW (ref 26–217)
Total Protein ELP: 6 g/dL (ref 6.0–8.5)

## 2023-12-11 LAB — CBC WITH DIFFERENTIAL (CANCER CENTER ONLY)
Abs Immature Granulocytes: 0.02 10*3/uL (ref 0.00–0.07)
Basophils Absolute: 0 10*3/uL (ref 0.0–0.1)
Basophils Relative: 1 %
Eosinophils Absolute: 0.1 10*3/uL (ref 0.0–0.5)
Eosinophils Relative: 1 %
HCT: 34.2 % — ABNORMAL LOW (ref 36.0–46.0)
Hemoglobin: 11.2 g/dL — ABNORMAL LOW (ref 12.0–15.0)
Immature Granulocytes: 0 %
Lymphocytes Relative: 4 %
Lymphs Abs: 0.3 10*3/uL — ABNORMAL LOW (ref 0.7–4.0)
MCH: 32.1 pg (ref 26.0–34.0)
MCHC: 32.7 g/dL (ref 30.0–36.0)
MCV: 98 fL (ref 80.0–100.0)
Monocytes Absolute: 0.3 10*3/uL (ref 0.1–1.0)
Monocytes Relative: 5 %
Neutro Abs: 5.2 10*3/uL (ref 1.7–7.7)
Neutrophils Relative %: 89 %
Platelet Count: 181 10*3/uL (ref 150–400)
RBC: 3.49 MIL/uL — ABNORMAL LOW (ref 3.87–5.11)
RDW: 14.4 % (ref 11.5–15.5)
WBC Count: 5.9 10*3/uL (ref 4.0–10.5)
nRBC: 0 % (ref 0.0–0.2)

## 2023-12-11 LAB — CMP (CANCER CENTER ONLY)
ALT: 17 U/L (ref 0–44)
AST: 19 U/L (ref 15–41)
Albumin: 4.2 g/dL (ref 3.5–5.0)
Alkaline Phosphatase: 57 U/L (ref 38–126)
Anion gap: 6 (ref 5–15)
BUN: 25 mg/dL — ABNORMAL HIGH (ref 8–23)
CO2: 26 mmol/L (ref 22–32)
Calcium: 9.7 mg/dL (ref 8.9–10.3)
Chloride: 106 mmol/L (ref 98–111)
Creatinine: 0.71 mg/dL (ref 0.44–1.00)
GFR, Estimated: >60 mL/min (ref >60)
Glucose, Bld: 104 mg/dL — ABNORMAL HIGH (ref 70–99)
Potassium: 4.1 mmol/L (ref 3.5–5.1)
Sodium: 138 mmol/L (ref 135–145)
Total Bilirubin: 0.9 mg/dL (ref 0.0–1.2)
Total Protein: 5.9 g/dL — ABNORMAL LOW (ref 6.5–8.1)

## 2023-12-11 MED ORDER — SODIUM CHLORIDE 0.9 % IV SOLN: Freq: Once | INTRAVENOUS | Status: DC

## 2023-12-11 MED ORDER — SODIUM CHLORIDE 0.9 % IV SOLN: INTRAVENOUS | Status: DC

## 2023-12-11 MED ORDER — SODIUM CHLORIDE 0.9% FLUSH
10.0000 mL | Freq: Once | INTRAVENOUS | Status: AC
  Administered 2023-12-11: 10 mL

## 2023-12-11 MED ORDER — CARFILZOMIB CHEMO INJECTION 60 MG
70.0000 mg/m2 | Freq: Once | INTRAVENOUS | Status: AC
  Administered 2023-12-11: 120 mg via INTRAVENOUS
  Filled 2023-12-11: qty 60

## 2023-12-11 MED ORDER — SODIUM CHLORIDE 0.9% FLUSH
10.0000 mL | INTRAVENOUS | Status: DC | PRN
  Administered 2023-12-11: 10 mL

## 2023-12-11 MED ORDER — HEPARIN SOD (PORK) LOCK FLUSH 100 UNIT/ML IV SOLN
500.0000 [IU] | Freq: Once | INTRAVENOUS | Status: AC | PRN
  Administered 2023-12-11: 500 [IU]

## 2023-12-11 NOTE — Patient Instructions (Signed)
 [facility] - A DEPT OF [facility]  Discharge Instructions: Thank you for choosing [facility] to provide your oncology and hematology care.   If you have a lab appointment with the Cancer Center, please go directly to the Cancer Center and check in at the registration area.   Wear comfortable clothing and clothing appropriate for easy access to any Portacath or PICC line.   We strive to give you quality time with your provider. You may need to reschedule your appointment if you arrive late (15 or more minutes).  Arriving late affects you and other patients whose appointments are after yours.  Also, if you miss three or more appointments without notifying the office, you may be dismissed from the clinic at the provider's discretion.      For prescription refill requests, have your pharmacy contact our office and allow 72 hours for refills to be completed.    Today you received the following chemotherapy and/or immunotherapy agents: Kyprolis      To help prevent nausea and vomiting after your treatment, we encourage you to take your nausea medication as directed.  BELOW ARE SYMPTOMS THAT SHOULD BE REPORTED IMMEDIATELY: *FEVER GREATER THAN 100.4 F (38 C) OR HIGHER *CHILLS OR SWEATING *NAUSEA AND VOMITING THAT IS NOT CONTROLLED WITH YOUR NAUSEA MEDICATION *UNUSUAL SHORTNESS OF BREATH *UNUSUAL BRUISING OR BLEEDING *URINARY PROBLEMS (pain or burning when urinating, or frequent urination) *BOWEL PROBLEMS (unusual diarrhea, constipation, pain near the anus) TENDERNESS IN MOUTH AND THROAT WITH OR WITHOUT PRESENCE OF ULCERS (sore throat, sores in mouth, or a toothache) UNUSUAL RASH, SWELLING OR PAIN  UNUSUAL VAGINAL DISCHARGE OR ITCHING   Items with * indicate a potential emergency and should be followed up as soon as possible or go to the Emergency Department if any problems should occur.  Please show the CHEMOTHERAPY ALERT CARD or IMMUNOTHERAPY  ALERT CARD at check-in to the Emergency Department and triage nurse.  Should you have questions after your visit or need to cancel or reschedule your appointment, please contact [facility] - A DEPT OF [facility]  Dept: [phone]  and follow the prompts.  Office hours are 8:00 a.m. to 4:30 p.m. Monday - Friday. Please note that voicemails left after 4:00 p.m. may not be returned until the following business day.  We are closed weekends and major holidays. You have access to a nurse at all times for urgent questions. Please call the main number to the clinic Dept: [phone] and follow the prompts.   For any non-urgent questions, you may also contact your provider using MyChart. We now offer e-Visits for anyone 73 and older to request care online for non-urgent symptoms. For details visit [email or web address].   Also download the MyChart app! Go to the app store, search "MyChart", open the app, select [place], and log in with your MyChart username and password.

## 2023-12-15 ENCOUNTER — Encounter: Payer: Self-pay | Admitting: Physician Assistant

## 2023-12-16 ENCOUNTER — Encounter: Payer: Self-pay | Admitting: Physician Assistant

## 2023-12-16 ENCOUNTER — Telehealth: Payer: Self-pay | Admitting: *Deleted

## 2023-12-16 NOTE — Telephone Encounter (Signed)
Received vm message from pt that she has tested + for Covid and requests a call back regarding her upcoming appts.   TCT patient and spoke with her. She states she's had a bit of a cough, sore throat. She did a home Covid test that was negative. She decided to go to her local pharmacy for a recheck of her covid test. She had a covid and flu test. She came back + for Covid. Pt states that she is afebrile but has a runny nose and a bit of a cough. She would still like to come in for her scheduled treatment on 12/18/23. Discussed this with infusion charge nurse-Nikki Wiley.  She can come in but will need to be in isolation from the time she arrives. Explained to Doris Wilkerson to arrive at 1pm on Friday, 12/18/23 but to stay in her car and call the infusion room from her care. Advised that she just needs to let then know she is here and they will come out to get her. Advised to wear a mask. This same procedure will occur on her 01/01/24 appt as well.  Advised she will have her port accessed, labs done, provider visit and infusion all done in the isolation room. Pt voiced understanding.  Doris Kaufmann, PA made aware of this.

## 2023-12-17 ENCOUNTER — Other Ambulatory Visit: Payer: Self-pay | Admitting: Family Medicine

## 2023-12-17 ENCOUNTER — Encounter: Payer: Self-pay | Admitting: Family Medicine

## 2023-12-17 DIAGNOSIS — E782 Mixed hyperlipidemia: Secondary | ICD-10-CM

## 2023-12-17 NOTE — Progress Notes (Signed)
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 Concerning for multiple myeloma vs metastatic disease 12/31/2020: establish care with Dr. Leonides Schanz. UPEP showed marked Bence Jones protein, findings concerning for multiple myleoma 01/21/2021: bone marrow biopsy confirms multiple myeloma with atypical plasma cells representing 28% of all cells in the aspirate  02/01/2021: Cycle 1 Day 1 of VRd chemotherapy 02/22/2021: Cycle 2 Day 1 of VRd chemotherapy 03/15/2021: Cycle 3 Day 1 of VRd chemotherapy 04/05/2021: Cycle 4 Day 1 of VRd chemotherapy 04/26/2021: Cycle 5 Day 1 of VRd chemotherapy 05/17/2021: Cycle 6 Day 1 of VRd chemotherapy 06/07/2021:  Cycle 7 Day 1 of VRd chemotherapy 06/28/2021: Cycle 8 Day 1 of VRd chemotherapy 07/19/2021: start maintenance revlimid 10mg  PO daily.  03/26/2022: Cycle 1 Day 1 of Dara/Pom/Dex 04/24/2023: Cycle 2 Day 1 of Dara/Pom/Dex 05/22/2023: Cycle 3 Day 1 of Dara/Pom/Dex 06/19/2023: Cycle 4 Day 1 of Dara/Pom/Dex 07/17/2023: Cycle 5 Day 1 of Dara/Pom/Dex 08/28/2023: d/c Dara/Pom/Dex due to progression.  09/11/2023: Cycle 1 Day 1 of Kyprolis/Dex 10/08/2023: Cycle 2 Day 1 of Kyprolis/Dex 11/06/2023: Cycle 3 Day 1 of Kyprolis/Dex 12/04/2023: Cycle 4 Day 1 of Kyprolis/Dex  Interval History:  Doris Wilkerson 78 y.o. female with medical history significant for lambda light chain multiple myeloma who presents for a follow up visit. She was last seen on 12/04/2023. In the interim has continued Kyprolis/Dex today. She is due for Cycle 4, Day 15 today.   On exam today Doris Wilkerson was diagnosed  with COVID this past week. She developed a productive cough and congestion which has improved today. She did have a low grade fever this morning that has subsided. She is otherwise feeling well and tolerating her treatment. She denies nausea or vomiting episodes. She takes miralax as needed to help with her bowel movements. She denies easy bruising or signs of bleeding. She denies chills, night sweats, shortness of breath or chest pain. She has no other complaints. A full 10 point ROS is listed below.    MEDICAL HISTORY:  Past Medical History:  Diagnosis Date   Asthma    Cataract    GERD (gastroesophageal reflux disease)    Hyperlipidemia    Hypertension    Thyroid disease     SURGICAL HISTORY: Past Surgical History:  Procedure Laterality Date   Cataract Surgery     CORONARY ANGIOPLASTY WITH STENT PLACEMENT     EYE SURGERY     IR IMAGING GUIDED PORT INSERTION  09/09/2023   ORTHOPEDIC SURGERY  2012   TONSILLECTOMY  1951    SOCIAL HISTORY: Social History   Socioeconomic History   Marital status: Married    Spouse name: Not on file   Number of children: 0   Years of education: Not on file   Highest education level: Master's degree (e.g., MA, MS, MEng, MEd, MSW, MBA)  Occupational History   Occupation: unemployed  Tobacco Use   Smoking status: Never   Smokeless tobacco: Never  Vaping Use   Vaping status: Never Used  Substance and Sexual Activity   Alcohol use: Yes    Alcohol/week: 1.0 standard drink of alcohol    Types: 1 Glasses of  wine per week    Comment: occassionally   Drug use: No   Sexual activity: Not Currently  Other Topics Concern   Not on file  Social History Narrative   Married. Education: college. Pt does exercise-   Social Drivers of Health   Financial Resource Strain: Low Risk  (04/24/2023)   Overall Financial Resource Strain (CARDIA)    Difficulty of Paying Living Expenses: Not hard at all  Food Insecurity: No Food Insecurity (04/24/2023)   Hunger  Vital Sign    Worried About Running Out of Food in the Last Year: Never true    Ran Out of Food in the Last Year: Never true  Transportation Needs: No Transportation Needs (04/24/2023)   PRAPARE - Administrator, Civil Service (Medical): No    Lack of Transportation (Non-Medical): No  Physical Activity: Insufficiently Active (04/24/2023)   Exercise Vital Sign    Days of Exercise per Week: 7 days    Minutes of Exercise per Session: 20 min  Stress: No Stress Concern Present (04/24/2023)   [employer] of Occupational Health - Occupational Stress Questionnaire    Feeling of Stress : Only a little  Social Connections: Moderately Integrated (04/24/2023)   Social Connection and Isolation Panel [NHANES]    Frequency of Communication with Friends and Family: Twice a week    Frequency of Social Gatherings with Friends and Family: Once a week    Attends Religious Services: Never    Database administrator or Organizations: Yes    Attends Engineer, structural: More than 4 times per year    Marital Status: Married  Catering manager Violence: Not At Risk (04/08/2023)   Humiliation, Afraid, Rape, and Kick questionnaire    Fear of Current or Ex-Partner: No    Emotionally Abused: No    Physically Abused: No    Sexually Abused: No    FAMILY HISTORY: Family History  Problem Relation Age of Onset   Cancer Father    Heart disease Brother    Dementia Brother    Leukemia Brother    Breast cancer Neg Hx    Colon cancer Neg Hx    Esophageal cancer Neg Hx    Pancreatic cancer Neg Hx    Rectal cancer Neg Hx    Stomach cancer Neg Hx     ALLERGIES:  is allergic to poison ivy extract, plavix [clopidogrel bisulfate], and other.  MEDICATIONS:  Current Outpatient Medications  Medication Sig Dispense Refill   acyclovir (ZOVIRAX) 400 MG tablet Take 1 tablet (400 mg total) by mouth 2 (two) times daily. 180 tablet 2   amLODipine (NORVASC) 5 MG tablet Take 1 tablet (5 mg total) by  mouth daily. 90 tablet 1   aspirin 81 MG tablet Take 81 mg by mouth daily.      atorvastatin (LIPITOR) 80 MG tablet TAKE 1 TABLET(80 MG) BY MOUTH DAILY AT 6 PM 90 tablet 1   benazepril (LOTENSIN) 10 MG tablet TAKE 1 TABLET(10 MG) BY MOUTH DAILY 90 tablet 1   Blood Glucose Monitoring Suppl DEVI 1 each by Does not apply route as needed (symptoms of low blood sugar.). May substitute to any manufacturer covered by patient's insurance. 1 each 0   carfilzomib (KYPROLIS) 10 MG SOLR Inject 70 mg into the vein once a week.     cholecalciferol (VITAMIN D3) 25 MCG (1000 UNIT) tablet Take 2,000 Units by mouth daily.     Continuous Glucose Sensor (FREESTYLE LIBRE 3 SENSOR) MISC 1  Device by Does not apply route continuous. Place 1 sensor on the skin every 14 days. Use to check glucose continuously 2 each 11   dexamethasone (DECADRON) 4 MG tablet Take 10 tablets (40 mg total) by mouth once a week. 30 tablet 5   famotidine (PEPCID) 20 MG tablet Take 20 mg by mouth daily.     gabapentin (NEURONTIN) 100 MG capsule TAKE 1 CAPSULE(100 MG) BY MOUTH TWICE DAILY AS NEEDED 180 capsule 1   levothyroxine (SYNTHROID) 25 MCG tablet TAKE 1 TABLET(25 MCG) BY MOUTH DAILY BEFORE BREAKFAST 90 tablet 3   lidocaine-prilocaine (EMLA) cream Apply 1 Application topically as needed. 30 g 0   Magnesium 100 MG TABS Take 100 mg by mouth daily.     nitroGLYCERIN (NITROSTAT) 0.4 MG SL tablet Place 1 tablet (0.4 mg total) under the tongue every 5 (five) minutes as needed for chest pain. 25 tablet 1   polyethylene glycol (MIRALAX / GLYCOLAX) 17 g packet Take 17 g by mouth daily as needed.     No current facility-administered medications for this visit.    REVIEW OF SYSTEMS:   Constitutional: ( - ) fevers, ( - )  chills , ( - ) night sweats Eyes: ( - ) blurriness of vision, ( - ) double vision, ( - ) watery eyes Ears, nose, mouth, throat, and face: ( - ) mucositis, ( - ) sore throat Respiratory: ( - ) cough, ( - ) dyspnea, ( - )  wheezes Cardiovascular: ( - ) palpitation, ( - ) chest discomfort, ( - ) lower extremity swelling Gastrointestinal:  ( - ) nausea, ( - ) heartburn, ( - ) change in bowel habits Skin: ( - ) abnormal skin rashes Lymphatics: ( - ) new lymphadenopathy, ( - ) easy bruising Neurological: ( +) numbness, ( - ) tingling, ( - ) new weaknesses Behavioral/Psych: ( - ) mood change, ( - ) new changes  All other systems were reviewed with the patient and are negative.  PHYSICAL EXAMINATION: ECOG PERFORMANCE STATUS: 1 - Symptomatic but completely ambulatory  There were no vitals filed for this visit.  There were no vitals filed for this visit.   [ Day 89 ], Cycle 4 12/18/23  Weight 138 lb 12.8 oz (63 kg)  Temp 98.2 F (36.8 C)  Temp src Oral  Pulse 90  Resp 18  BP 137/73    GENERAL: well appearing elderly Caucasian female in NAD  SKIN: skin color, texture, turgor are normal, no rashes or significant lesions EYES: conjunctiva are pink and non-injected, sclera clear LUNGS: clear to auscultation and percussion with normal breathing effort HEART: regular rate & rhythm and no murmurs and trace lower extremity edema Musculoskeletal: no cyanosis of digits and no clubbing  PSYCH: alert & oriented x 3, fluent speech NEURO: no focal motor/sensory deficits  LABORATORY DATA:  I have reviewed the data as listed    Latest Ref Rng & Units 12/18/2023    1:16 PM 12/11/2023    1:41 PM 12/04/2023    9:18 AM  CBC  WBC 4.0 - 10.5 K/uL 8.1  5.9  5.3   Hemoglobin 12.0 - 15.0 g/dL 16.1  09.6  04.5   Hematocrit 36.0 - 46.0 % 31.9  34.2  35.5   Platelets 150 - 400 K/uL 143  181  327        Latest Ref Rng & Units 12/11/2023    1:41 PM 12/04/2023    9:18 AM 11/20/2023    9:38  AM  CMP  Glucose 70 - 99 mg/dL 409  811  914   BUN 8 - 23 mg/dL 25  21  24    Creatinine 0.44 - 1.00 mg/dL 7.82  9.56  2.13   Sodium 135 - 145 mmol/L 138  138  138   Potassium 3.5 - 5.1 mmol/L 4.1  4.1  4.0   Chloride 98 - 111  mmol/L 106  107  107   CO2 22 - 32 mmol/L 26  26  25    Calcium 8.9 - 10.3 mg/dL 9.7  08.6  9.9   Total Protein 6.5 - 8.1 g/dL 5.9  6.3  5.9   Total Bilirubin 0.0 - 1.2 mg/dL 0.9  1.2  0.9   Alkaline Phos 38 - 126 U/L 57  52  51   AST 15 - 41 U/L 19  20  16    ALT 0 - 44 U/L 17  16  15      Lab Results  Component Value Date   MPROTEIN Not Observed 12/04/2023   MPROTEIN 0.1 (H) 11/20/2023   MPROTEIN 0.1 (H) 11/06/2023   Lab Results  Component Value Date   KPAFRELGTCHN 4.2 12/04/2023   KPAFRELGTCHN 4.1 11/20/2023   KPAFRELGTCHN 3.3 11/06/2023   LAMBDASER 52.7 (H) 12/04/2023   LAMBDASER 48.3 (H) 11/20/2023   LAMBDASER 37.8 (H) 11/06/2023   KAPLAMBRATIO 0.08 (L) 12/04/2023   KAPLAMBRATIO 0.08 (L) 11/20/2023   KAPLAMBRATIO 0.09 (L) 11/06/2023    RADIOGRAPHIC STUDIES: No results found.   ASSESSMENT & PLAN Doris Wilkerson is a 78 y.o. female with medical history significant for lambda light chain multiple myeloma who presents for a follow up visit. Ms. Koble is not a candidate for bone marrow transplant based on her advanced age.    # Lambda Light Chain Multiple Myeloma--VGPR on maintenance --diagnosis confirmed with bone marrow biopsy and urine protein analysis.   --patient has good functional status and would be an excellent candidate for VRd chemotherapy. I do not believe she would be a bone marrow transplant candidate based on her age.  --Received VRD chemotherapy from 02/01/2021-07/12/2021 then started maintenance Revlimid on 07/19/2021.  --Due to rising lambda light chain, recommended to switch to Daratumumab/Pomalyst/Dexamethasone. --Received Dara/Pom/Dex from 03/27/2023-08/28/2023. --Due to rising lambda light chain, recommend to switch to Kyprolis/Dex 3 weeks on 1 week off, starting today. Plan: --Due to start Cycle 4, Day 15 of [place]/Dex today --Labs today show white blood cell count 8.1, hemoglobin 10.7, MCV 94.9, platelets 143. Creatinine and LFTs are within normal  limits.  Myeloma labs pending today.  --Last myeloma labs from 12/04/2023 showed lambda light chains were trending up to 52.7, need to closely monitor.  --Plan to obtain myeloma labs every cycle.  --Proceed with treatment today without any dose modifications.  --Return to clinic weekly for labs/treatment and follow up visit in 2 weeks  #COVID infection: --Patient is overall feeling better since diagnosis.   --Lungs are clear upon auscultation. Vitals stable today --Advised to closely monitor symptoms and consider chest xray if cough and/or breathing worsens over the weekend. She is scheduled for a virtual visit with PCP on Monday.   #Hypoglycemia/Hyperglycemia --Patient is asymptomatic today. Encouraged to eat right before treatment and snacks every 2-3 hours.  --Patient will check her glucose levels on non-treatment days and follow up with PCP if non-fasting glucose is consistently 70 or below.  #Back Pain/Left Hip Pain  -- Currently well controlled on gabapentin and Tylenol --CT thoracic and lumbar spine showed no  lytic lesions or sequelae of multiple myeloma --MRI thoracic and lumbar spine from 07/07/2022 for baseline imaging. Findings showed multiple bone lesions involving posterior T9 and T8, lumbar spine and upper sacrum consistent with multiple myeloma. No extension into the canal. No pathologic fracture.  --continue follow up with Dr .Donalee Citrin, neurosugery --Continue to monitor  #Supportive Care --chemotherapy education complete  --port placement complete on 09/09/2023. EMLA cream sent today.  --zofran 8mg  q8H PRN and compazine 10mg  PO q6H for nausea -- acyclovir 400mg  PO BID for VCZ prophylaxis --2 year of zometa therapy completed in July 2024.  -- no prescription pain medication required at this time.   No orders of the defined types were placed in this encounter.  All questions were answered. The patient knows to call the clinic with any problems, questions or concerns.  I  have spent a total of 30 minutes minutes of face-to-face and non-face-to-face time, preparing to see the patient, performing a medically appropriate examination, counseling and educating the patient, documenting clinical information in the electronic health record.   Georga Kaufmann PA-C Dept of Hematology and Oncology [facility] at [facility] Phone: [phone]   12/18/2023 2:12 PM

## 2023-12-18 ENCOUNTER — Inpatient Hospital Stay (HOSPITAL_BASED_OUTPATIENT_CLINIC_OR_DEPARTMENT_OTHER): Payer: Medicare Other | Admitting: Physician Assistant

## 2023-12-18 ENCOUNTER — Inpatient Hospital Stay: Payer: Medicare Other

## 2023-12-18 ENCOUNTER — Inpatient Hospital Stay: Payer: Medicare Other | Admitting: Physician Assistant

## 2023-12-18 VITALS — BP 139/68 | HR 89 | Temp 98.5°F | Resp 18 | Wt 138.8 lb

## 2023-12-18 DIAGNOSIS — C9000 Multiple myeloma not having achieved remission: Secondary | ICD-10-CM

## 2023-12-18 DIAGNOSIS — Z79624 Long term (current) use of inhibitors of nucleotide synthesis: Secondary | ICD-10-CM | POA: Diagnosis not present

## 2023-12-18 DIAGNOSIS — Z5112 Encounter for antineoplastic immunotherapy: Secondary | ICD-10-CM | POA: Diagnosis not present

## 2023-12-18 DIAGNOSIS — Z7982 Long term (current) use of aspirin: Secondary | ICD-10-CM | POA: Diagnosis not present

## 2023-12-18 DIAGNOSIS — Z79899 Other long term (current) drug therapy: Secondary | ICD-10-CM | POA: Diagnosis not present

## 2023-12-18 LAB — CMP (CANCER CENTER ONLY)
ALT: 20 U/L (ref 0–44)
AST: 18 U/L (ref 15–41)
Albumin: 3.9 g/dL (ref 3.5–5.0)
Alkaline Phosphatase: 59 U/L (ref 38–126)
Anion gap: 4 — ABNORMAL LOW (ref 5–15)
BUN: 15 mg/dL (ref 8–23)
CO2: 25 mmol/L (ref 22–32)
Calcium: 9.5 mg/dL (ref 8.9–10.3)
Chloride: 108 mmol/L (ref 98–111)
Creatinine: 0.62 mg/dL (ref 0.44–1.00)
GFR, Estimated: >60 mL/min (ref >60)
Glucose, Bld: 102 mg/dL — ABNORMAL HIGH (ref 70–99)
Potassium: 3.9 mmol/L (ref 3.5–5.1)
Sodium: 137 mmol/L (ref 135–145)
Total Bilirubin: 0.8 mg/dL (ref 0.0–1.2)
Total Protein: 5.9 g/dL — ABNORMAL LOW (ref 6.5–8.1)

## 2023-12-18 LAB — CBC WITH DIFFERENTIAL (CANCER CENTER ONLY)
Abs Immature Granulocytes: 0.04 10*3/uL (ref 0.00–0.07)
Basophils Absolute: 0 10*3/uL (ref 0.0–0.1)
Basophils Relative: 0 %
Eosinophils Absolute: 0.1 10*3/uL (ref 0.0–0.5)
Eosinophils Relative: 2 %
HCT: 31.9 % — ABNORMAL LOW (ref 36.0–46.0)
Hemoglobin: 10.7 g/dL — ABNORMAL LOW (ref 12.0–15.0)
Immature Granulocytes: 1 %
Lymphocytes Relative: 4 %
Lymphs Abs: 0.3 10*3/uL — ABNORMAL LOW (ref 0.7–4.0)
MCH: 31.8 pg (ref 26.0–34.0)
MCHC: 33.5 g/dL (ref 30.0–36.0)
MCV: 94.9 fL (ref 80.0–100.0)
Monocytes Absolute: 0.6 10*3/uL (ref 0.1–1.0)
Monocytes Relative: 7 %
Neutro Abs: 7.1 10*3/uL (ref 1.7–7.7)
Neutrophils Relative %: 86 %
Platelet Count: 143 10*3/uL — ABNORMAL LOW (ref 150–400)
RBC: 3.36 MIL/uL — ABNORMAL LOW (ref 3.87–5.11)
RDW: 13.7 % (ref 11.5–15.5)
WBC Count: 8.1 10*3/uL (ref 4.0–10.5)
nRBC: 0 % (ref 0.0–0.2)

## 2023-12-18 MED ORDER — SODIUM CHLORIDE 0.9 % IV SOLN: Freq: Once | INTRAVENOUS | Status: AC

## 2023-12-18 MED ORDER — DEXTROSE 5 % IV SOLN
70.0000 mg/m2 | Freq: Once | INTRAVENOUS | Status: AC
  Administered 2023-12-18: 120 mg via INTRAVENOUS
  Filled 2023-12-18: qty 60

## 2023-12-18 MED ORDER — SODIUM CHLORIDE 0.9 % IV SOLN: INTRAVENOUS | Status: DC

## 2023-12-18 MED ORDER — SODIUM CHLORIDE 0.9% FLUSH
10.0000 mL | INTRAVENOUS | Status: DC | PRN
  Administered 2023-12-18: 10 mL

## 2023-12-18 MED ORDER — HEPARIN SOD (PORK) LOCK FLUSH 100 UNIT/ML IV SOLN
500.0000 [IU] | Freq: Once | INTRAVENOUS | Status: AC | PRN
  Administered 2023-12-18: 500 [IU]

## 2023-12-18 NOTE — Progress Notes (Signed)
Patien took her dexamethasone at home today about 1130.

## 2023-12-18 NOTE — Patient Instructions (Signed)
[facility] - A DEPT OF [facility]  Discharge Instructions: Thank you for choosing [facility] to provide your oncology and hematology care.   If you have a lab appointment with the Cancer Center, please go directly to the Cancer Center and check in at the registration area.   Wear comfortable clothing and clothing appropriate for easy access to any Portacath or PICC line.   We strive to give you quality time with your provider. You may need to reschedule your appointment if you arrive late (15 or more minutes).  Arriving late affects you and other patients whose appointments are after yours.  Also, if you miss three or more appointments without notifying the office, you may be dismissed from the clinic at the provider's discretion.      For prescription refill requests, have your pharmacy contact our office and allow 72 hours for refills to be completed.    Today you received the following chemotherapy and/or immunotherapy agents: Kyprolis.       To help prevent nausea and vomiting after your treatment, we encourage you to take your nausea medication as directed.  BELOW ARE SYMPTOMS THAT SHOULD BE REPORTED IMMEDIATELY: *FEVER GREATER THAN 100.4 F (38 C) OR HIGHER *CHILLS OR SWEATING *NAUSEA AND VOMITING THAT IS NOT CONTROLLED WITH YOUR NAUSEA MEDICATION *UNUSUAL SHORTNESS OF BREATH *UNUSUAL BRUISING OR BLEEDING *URINARY PROBLEMS (pain or burning when urinating, or frequent urination) *BOWEL PROBLEMS (unusual diarrhea, constipation, pain near the anus) TENDERNESS IN MOUTH AND THROAT WITH OR WITHOUT PRESENCE OF ULCERS (sore throat, sores in mouth, or a toothache) UNUSUAL RASH, SWELLING OR PAIN  UNUSUAL VAGINAL DISCHARGE OR ITCHING   Items with * indicate a potential emergency and should be followed up as soon as possible or go to the Emergency Department if any problems should occur.  Please show the CHEMOTHERAPY ALERT CARD or IMMUNOTHERAPY  ALERT CARD at check-in to the Emergency Department and triage nurse.  Should you have questions after your visit or need to cancel or reschedule your appointment, please contact [facility] - A DEPT OF [facility]  Dept: [phone]  and follow the prompts.  Office hours are 8:00 a.m. to 4:30 p.m. Monday - Friday. Please note that voicemails left after 4:00 p.m. may not be returned until the following business day.  We are closed weekends and major holidays. You have access to a nurse at all times for urgent questions. Please call the main number to the clinic Dept: [phone] and follow the prompts.   For any non-urgent questions, you may also contact your provider using MyChart. We now offer e-Visits for anyone 45 and older to request care online for non-urgent symptoms. For details visit [email or web address].   Also download the MyChart app! Go to the app store, search "MyChart", open the app, select [place], and log in with your MyChart username and password.

## 2023-12-18 NOTE — Telephone Encounter (Signed)
Pt was contacted this morning to offer a sooner appointment. She stated she was feeling some better than the day prior. She has infusion scheduled this afternoon and will see her oncology PA. She wanted to go ahead and schedule the sooner appointment for Monday just in case she needs it, and depending what her PA advises, she will call back to cancel if she no longer needs to be seen.

## 2023-12-20 DIAGNOSIS — U071 COVID-19: Secondary | ICD-10-CM | POA: Diagnosis not present

## 2023-12-20 DIAGNOSIS — J209 Acute bronchitis, unspecified: Secondary | ICD-10-CM | POA: Diagnosis not present

## 2023-12-21 ENCOUNTER — Encounter: Payer: Self-pay | Admitting: Family Medicine

## 2023-12-21 ENCOUNTER — Telehealth: Payer: Self-pay

## 2023-12-21 ENCOUNTER — Telehealth: Payer: Medicare Other | Admitting: Family Medicine

## 2023-12-21 LAB — KAPPA/LAMBDA LIGHT CHAINS
Kappa free light chain: 4.5 mg/L (ref 3.3–19.4)
Kappa, lambda light chain ratio: 0.08 — ABNORMAL LOW (ref 0.26–1.65)
Lambda free light chains: 55.2 mg/L — ABNORMAL HIGH (ref 5.7–26.3)

## 2023-12-21 NOTE — Telephone Encounter (Signed)
Sent to [facility] clinic pool

## 2023-12-21 NOTE — Telephone Encounter (Signed)
Called patient to clarify she wanted to cancel appointment today. Patient states yes. Canceled appt and advised if sx worsen to please call to be seen.

## 2023-12-22 LAB — MULTIPLE MYELOMA PANEL, SERUM
Albumin SerPl Elph-Mcnc: 3.8 g/dL (ref 2.9–4.4)
Albumin/Glob SerPl: 2.3 — ABNORMAL HIGH (ref 0.7–1.7)
Alpha 1: 0.2 g/dL (ref 0.0–0.4)
Alpha2 Glob SerPl Elph-Mcnc: 0.6 g/dL (ref 0.4–1.0)
B-Globulin SerPl Elph-Mcnc: 0.8 g/dL (ref 0.7–1.3)
Gamma Glob SerPl Elph-Mcnc: 0.2 g/dL — ABNORMAL LOW (ref 0.4–1.8)
Globulin, Total: 1.7 g/dL — ABNORMAL LOW (ref 2.2–3.9)
IgA: 16 mg/dL — ABNORMAL LOW (ref 64–422)
IgG (Immunoglobin G), Serum: 227 mg/dL — ABNORMAL LOW (ref 586–1602)
IgM (Immunoglobulin M), Srm: 7 mg/dL — ABNORMAL LOW (ref 26–217)
Total Protein ELP: 5.5 g/dL — ABNORMAL LOW (ref 6.0–8.5)

## 2023-12-24 ENCOUNTER — Ambulatory Visit: Payer: Medicare Other | Admitting: Family Medicine

## 2023-12-30 ENCOUNTER — Ambulatory Visit: Payer: Medicare Other | Attending: Cardiology | Admitting: Cardiology

## 2023-12-30 ENCOUNTER — Encounter: Payer: Self-pay | Admitting: Cardiology

## 2023-12-30 VITALS — BP 136/80 | HR 83 | Resp 16 | Ht 66.0 in | Wt 140.0 lb

## 2023-12-30 DIAGNOSIS — I10 Essential (primary) hypertension: Secondary | ICD-10-CM | POA: Diagnosis not present

## 2023-12-30 DIAGNOSIS — E7800 Pure hypercholesterolemia, unspecified: Secondary | ICD-10-CM | POA: Diagnosis not present

## 2023-12-30 DIAGNOSIS — I25118 Atherosclerotic heart disease of native coronary artery with other forms of angina pectoris: Secondary | ICD-10-CM | POA: Insufficient documentation

## 2023-12-30 NOTE — Patient Instructions (Signed)
 Medication Instructions:  Your physician recommends that you continue on your current medications as directed. Please refer to the Current Medication list given to you today.  *If you need a refill on your cardiac medications before your next appointment, please call your pharmacy*   Lab Work: none If you have labs (blood work) drawn today and your tests are completely normal, you will receive your results only by: MyChart Message (if you have MyChart) OR A paper copy in the mail If you have any lab test that is abnormal or we need to change your treatment, we will call you to review the results.   Testing/Procedures: none   Follow-Up: At [facility], you and your health needs are our priority.  As part of our continuing mission to provide you with exceptional heart care, we have created designated Provider Care Teams.  These Care Teams include your primary Cardiologist (physician) and Advanced Practice Providers (APPs -  Physician Assistants and Nurse Practitioners) who all work together to provide you with the care you need, when you need it.  We recommend signing up for the patient portal called "MyChart".  Sign up information is provided on this After Visit Summary.  MyChart is used to connect with patients for Virtual Visits (Telemedicine).  Patients are able to view lab/test results, encounter notes, upcoming appointments, etc.  Non-urgent messages can be sent to your provider as well.   To learn more about what you can do with MyChart, go to [email or web address].    Your next appointment:   6 month(s)  Provider:   Yates Decamp, MD     Other Instructions

## 2023-12-30 NOTE — Progress Notes (Signed)
 Cardiology Office Note:  .   Date:  12/30/2023  ID:  Doris Wilkerson, Doris Wilkerson 03/25/1946, MRN [id number] PCP: Levora Reyes SAUNDERS, MD  [facility] Providers Cardiologist:  Gordy Bergamo, MD   History of Present Illness: .   Doris Wilkerson is a 78 y.o. 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 Past medical history significant for hypertension, hyperlipidemia. 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   Discussed the use of AI scribe software for clinical note transcription with the patient, who gave verbal consent to proceed.  History of Present Illness   Doris Wilkerson is a 78 year old female with multiple myeloma and coronary artery disease who presents for follow-up after a recent adverse cardiac event and COVID-19 infection.  Two months ago, she awoke at 2:30 AM with chest pain and high blood pressure, which resolved within 15 minutes after taking one nitroglycerin  tablet. She has not had another event since. Her current medications for coronary artery disease include aspirin, Lipitor 80 mg, benazepril , amlodipine , and nitroglycerin  as needed.  Two weeks ago, she tested positive for COVID-19 and experienced significant symptoms, including a fever of 101F and breathing difficulties, lasting five to seven days. She attributes the severity of her symptoms to her compromised immunity due to chemotherapy. She is currently feeling well and is wearing a mask to prevent transmission. A recent chest X-ray was normal.  She has a history of multiple myeloma diagnosed three years ago. Initially treated with carfilzomib , she relapsed twice in the past year. She is currently on carfilzomib  and dexamethasone  40 mg during infusions to reduce inflammation. She reports no cardiotoxicity events from the treatment. She  experiences a high heart rate, which is attributed to the hypermetabolic effects of her chemotherapy. She has a port for treatment, which she finds annoying.      Labs   Lab Results  Component Value Date   CHOL 151 07/09/2023   HDL 76.40 07/09/2023   LDLCALC 63 07/09/2023   TRIG 56.0 07/09/2023   CHOLHDL 2 07/09/2023   Lab Results  Component Value Date   NA 137 12/18/2023   K 3.9 12/18/2023   CO2 25 12/18/2023   GLUCOSE 102 (H) 12/18/2023   BUN 15 12/18/2023   CREATININE 0.62 12/18/2023   CALCIUM  9.5 12/18/2023   GFR 70.49 01/08/2023   GFRNONAA >60 12/18/2023      Latest Ref Rng & Units 12/18/2023    1:16 PM 12/11/2023    1:41 PM 12/04/2023    9:18 AM  BMP  Glucose 70 - 99 mg/dL 897  895  893   BUN 8 - 23 mg/dL 15  25  21    Creatinine 0.44 - 1.00 mg/dL 9.37  9.28  9.31   Sodium 135 - 145 mmol/L 137  138  138   Potassium 3.5 - 5.1 mmol/L 3.9  4.1  4.1   Chloride 98 - 111 mmol/L 108  106  107   CO2 22 - 32 mmol/L 25  26  26    Calcium  8.9 - 10.3 mg/dL 9.5  9.7  89.9       Latest Ref Rng & Units 12/18/2023    1:16 PM 12/11/2023    1:41 PM 12/04/2023    9:18 AM  CBC  WBC 4.0 - 10.5 K/uL 8.1  5.9  5.3   Hemoglobin  12.0 - 15.0 g/dL 89.2  88.7  87.9   Hematocrit 36.0 - 46.0 % 31.9  34.2  35.5   Platelets 150 - 400 K/uL 143  181  327    Review of Systems  Cardiovascular:  Negative for chest pain, dyspnea on exertion and leg swelling.   Physical Exam:   VS:  BP 136/80 (BP Location: Left Arm, Patient Position: Sitting, Cuff Size: Normal)   Pulse 83   Resp 16   Ht 5' 6 (1.676 m)   Wt 140 lb (63.5 kg)   SpO2 99%   BMI 22.60 kg/m    Wt Readings from Last 3 Encounters:  12/30/23 140 lb (63.5 kg)  12/18/23 138 lb 12.8 oz (63 kg)  12/11/23 141 lb (64 kg)    Physical Exam Neck:     Vascular: No carotid bruit or JVD.  Cardiovascular:     Rate and Rhythm: Normal rate and regular rhythm.     Pulses: Intact distal pulses.     Heart sounds: Normal heart sounds. No murmur  heard.    No gallop.  Pulmonary:     Effort: Pulmonary effort is normal.     Breath sounds: Normal breath sounds.  Abdominal:     General: Bowel sounds are normal.     Palpations: Abdomen is soft.  Musculoskeletal:     Right lower leg: No edema.     Left lower leg: No edema.    Studies Reviewed: .    Echocardiogram 02/05/2022: Normal LV systolic function with EF 74%. Left ventricle cavity is normal in size. Normal left ventricular wall thickness. Normal global wall motion. Normal diastolic filling pattern, normal LAP. Trace aortic regurgitation. No significant valvular heart disease. Compared to study 04/30/2011 no significant change.  Lexiscan  (with Mod Bruce protocol) Nuclear stress test 02/03/2022: Normal ECG stress. The patient was injected with 0.4 mg of Intravenous Lexiscan  over 15 sec. Additionally, patient exercised on a Modified Bruce protocol. Achieved 100% MPHR.  Myocardial perfusion is normal. Overall LV systolic function is normal without regional wall motion abnormalities. Stress LV EF: 51%.  No previous exam available for comparison. Low risk.   EKG:         EKG 07/13/2023: Normal sinus rhythm at the rate of 54 bpm, left atrial enlargement, left axis deviation, left anterior fascicular block.  LVH.  Compared to 02/23/2023, fourth digit has increased in limb leads.  Left axis deviation not present.  However compared to 01/10/2022, no change.   Medications and allergies    Allergies  Allergen Reactions   Poison Ivy Extract Swelling   Plavix [Clopidogrel Bisulfate]    Other     seasonal     Current Outpatient Medications:    acyclovir  (ZOVIRAX ) 400 MG tablet, Take 1 tablet (400 mg total) by mouth 2 (two) times daily., Disp: 180 tablet, Rfl: 2   amLODipine  (NORVASC ) 5 MG tablet, Take 1 tablet (5 mg total) by mouth daily., Disp: 90 tablet, Rfl: 1   aspirin 81 MG tablet, Take 81 mg by mouth daily. , Disp: , Rfl:    atorvastatin  (LIPITOR) 80 MG tablet, TAKE 1  TABLET(80 MG) BY MOUTH DAILY AT 6 PM, Disp: 90 tablet, Rfl: 1   benazepril  (LOTENSIN ) 10 MG tablet, TAKE 1 TABLET(10 MG) BY MOUTH DAILY, Disp: 90 tablet, Rfl: 1   Blood Glucose Monitoring Suppl DEVI, 1 each by Does not apply route as needed (symptoms of low blood sugar.). May substitute to any manufacturer covered by patient's insurance.,  Disp: 1 each, Rfl: 0   carfilzomib  (KYPROLIS ) 10 MG SOLR, Inject 70 mg into the vein once a week., Disp: , Rfl:    cholecalciferol (VITAMIN D3) 25 MCG (1000 UNIT) tablet, Take 2,000 Units by mouth daily., Disp: , Rfl:    Continuous Glucose Sensor (FREESTYLE LIBRE 3 SENSOR) MISC, 1 Device by Does not apply route continuous. Place 1 sensor on the skin every 14 days. Use to check glucose continuously, Disp: 2 each, Rfl: 11   dexamethasone  (DECADRON ) 4 MG tablet, Take 10 tablets (40 mg total) by mouth once a week., Disp: 30 tablet, Rfl: 5   famotidine (PEPCID) 20 MG tablet, Take 20 mg by mouth daily., Disp: , Rfl:    gabapentin  (NEURONTIN ) 100 MG capsule, TAKE 1 CAPSULE(100 MG) BY MOUTH TWICE DAILY AS NEEDED, Disp: 180 capsule, Rfl: 1   levothyroxine  (SYNTHROID ) 25 MCG tablet, TAKE 1 TABLET(25 MCG) BY MOUTH DAILY BEFORE BREAKFAST, Disp: 90 tablet, Rfl: 3   lidocaine -prilocaine  (EMLA ) cream, Apply 1 Application topically as needed., Disp: 30 g, Rfl: 0   Magnesium 100 MG TABS, Take 100 mg by mouth daily., Disp: , Rfl:    nitroGLYCERIN  (NITROSTAT ) 0.4 MG SL tablet, Place 1 tablet (0.4 mg total) under the tongue every 5 (five) minutes as needed for chest pain., Disp: 25 tablet, Rfl: 1   polyethylene glycol (MIRALAX / GLYCOLAX) 17 g packet, Take 17 g by mouth daily as needed., Disp: , Rfl:    ASSESSMENT AND PLAN: .      ICD-10-CM   1. Coronary artery disease of native artery of native heart with stable angina pectoris (HCC)  I25.118     2. Primary hypertension  I10     3. Hypercholesteremia  E78.00      Assessment and Plan    Coronary Artery Disease Coronary  artery disease managed with aspirin, atorvastatin  80 mg, benazepril , and amlodipine .  Has had 1 episode of angina relieved with nitroglycerin . Echocardiogram and stress test normal with EF of 74%. - Continue aspirin, atorvastatin , benazepril , and amlodipine  - Use nitroglycerin  as needed for angina - Monitor blood pressure and cholesterol levels - Follow-up in six months  Multiple Myeloma Multiple myeloma diagnosed three years ago with two relapses in the past year. Currently on carfilzomib  and dexamethasone  40 mg infusions weekly. No cardiotoxicity observed but aware of 20% risk with proteasome inhibitors. Recent COVID-19 infection likely exacerbated by immunosuppression from chemotherapy. -  Monitor for cardiotoxicity - Discuss potential transition to bispecific immunotherapy with oncologist - Follow-up with oncologist Dr. Federico on Friday  Primary hypertension Blood pressure is well-controlled on present medical regimen that is being used for angina as well.  No changes were done reviewed her labs.  Hypercholesterolemia Excellent control of lipids with LDL goal less than 70.  Continue atorvastatin  80 mg daily. Follow-up appointment in six months.   Signed,  Gordy Bergamo, MD, [facility] 12/30/2023, 9:35 PM [facility] [address] [place] [address] [place], [place] [address] Phone: [phone]. Fax:  [phone]

## 2024-01-01 ENCOUNTER — Ambulatory Visit: Payer: Medicare Other

## 2024-01-01 ENCOUNTER — Inpatient Hospital Stay: Payer: Medicare Other | Attending: Physician Assistant

## 2024-01-01 ENCOUNTER — Other Ambulatory Visit: Payer: Medicare Other

## 2024-01-01 ENCOUNTER — Inpatient Hospital Stay (HOSPITAL_BASED_OUTPATIENT_CLINIC_OR_DEPARTMENT_OTHER): Payer: Medicare Other | Attending: Physician Assistant | Admitting: Hematology and Oncology

## 2024-01-01 ENCOUNTER — Ambulatory Visit: Payer: Medicare Other | Admitting: Physician Assistant

## 2024-01-01 ENCOUNTER — Encounter: Payer: Self-pay | Admitting: Hematology and Oncology

## 2024-01-01 VITALS — BP 127/77 | HR 95 | Temp 98.0°F | Resp 16 | Wt 139.3 lb

## 2024-01-01 DIAGNOSIS — C9000 Multiple myeloma not having achieved remission: Secondary | ICD-10-CM | POA: Diagnosis not present

## 2024-01-01 DIAGNOSIS — Z79899 Other long term (current) drug therapy: Secondary | ICD-10-CM | POA: Insufficient documentation

## 2024-01-01 DIAGNOSIS — Z79624 Long term (current) use of inhibitors of nucleotide synthesis: Secondary | ICD-10-CM | POA: Insufficient documentation

## 2024-01-01 DIAGNOSIS — Z95828 Presence of other vascular implants and grafts: Secondary | ICD-10-CM | POA: Diagnosis not present

## 2024-01-01 DIAGNOSIS — M898X9 Other specified disorders of bone, unspecified site: Secondary | ICD-10-CM

## 2024-01-01 DIAGNOSIS — Z7982 Long term (current) use of aspirin: Secondary | ICD-10-CM | POA: Insufficient documentation

## 2024-01-01 DIAGNOSIS — Z7961 Long term (current) use of immunomodulator: Secondary | ICD-10-CM | POA: Insufficient documentation

## 2024-01-01 DIAGNOSIS — Z5112 Encounter for antineoplastic immunotherapy: Secondary | ICD-10-CM | POA: Diagnosis not present

## 2024-01-01 DIAGNOSIS — M899 Disorder of bone, unspecified: Secondary | ICD-10-CM | POA: Diagnosis not present

## 2024-01-01 LAB — CBC WITH DIFFERENTIAL (CANCER CENTER ONLY)
Abs Immature Granulocytes: 0.02 10*3/uL (ref 0.00–0.07)
Basophils Absolute: 0 10*3/uL (ref 0.0–0.1)
Basophils Relative: 1 %
Eosinophils Absolute: 0 10*3/uL (ref 0.0–0.5)
Eosinophils Relative: 0 %
HCT: 33.9 % — ABNORMAL LOW (ref 36.0–46.0)
Hemoglobin: 11.2 g/dL — ABNORMAL LOW (ref 12.0–15.0)
Immature Granulocytes: 0 %
Lymphocytes Relative: 4 %
Lymphs Abs: 0.3 10*3/uL — ABNORMAL LOW (ref 0.7–4.0)
MCH: 31.9 pg (ref 26.0–34.0)
MCHC: 33 g/dL (ref 30.0–36.0)
MCV: 96.6 fL (ref 80.0–100.0)
Monocytes Absolute: 0.1 10*3/uL (ref 0.1–1.0)
Monocytes Relative: 1 %
Neutro Abs: 6.4 10*3/uL (ref 1.7–7.7)
Neutrophils Relative %: 94 %
Platelet Count: 474 10*3/uL — ABNORMAL HIGH (ref 150–400)
RBC: 3.51 MIL/uL — ABNORMAL LOW (ref 3.87–5.11)
RDW: 12.7 % (ref 11.5–15.5)
WBC Count: 6.8 10*3/uL (ref 4.0–10.5)
nRBC: 0 % (ref 0.0–0.2)

## 2024-01-01 LAB — CMP (CANCER CENTER ONLY)
ALT: 19 U/L (ref 0–44)
AST: 23 U/L (ref 15–41)
Albumin: 4.3 g/dL (ref 3.5–5.0)
Alkaline Phosphatase: 61 U/L (ref 38–126)
Anion gap: 5 (ref 5–15)
BUN: 18 mg/dL (ref 8–23)
CO2: 25 mmol/L (ref 22–32)
Calcium: 9.7 mg/dL (ref 8.9–10.3)
Chloride: 107 mmol/L (ref 98–111)
Creatinine: 0.73 mg/dL (ref 0.44–1.00)
GFR, Estimated: >60 mL/min (ref >60)
Glucose, Bld: 157 mg/dL — ABNORMAL HIGH (ref 70–99)
Potassium: 3.7 mmol/L (ref 3.5–5.1)
Sodium: 137 mmol/L (ref 135–145)
Total Bilirubin: 0.6 mg/dL (ref 0.0–1.2)
Total Protein: 6.3 g/dL — ABNORMAL LOW (ref 6.5–8.1)

## 2024-01-01 MED ORDER — HEPARIN SOD (PORK) LOCK FLUSH 100 UNIT/ML IV SOLN
500.0000 [IU] | Freq: Once | INTRAVENOUS | Status: AC | PRN
  Administered 2024-01-01: 500 [IU]

## 2024-01-01 MED ORDER — SODIUM CHLORIDE 0.9% FLUSH
10.0000 mL | INTRAVENOUS | Status: DC | PRN
  Administered 2024-01-01: 10 mL

## 2024-01-01 MED ORDER — SODIUM CHLORIDE 0.9 % IV SOLN
Freq: Once | INTRAVENOUS | Status: AC
Start: 2024-01-01 — End: 2024-01-01

## 2024-01-01 MED ORDER — DEXTROSE 5 % IV SOLN
70.0000 mg/m2 | Freq: Once | INTRAVENOUS | Status: AC
  Administered 2024-01-01: 120 mg via INTRAVENOUS
  Filled 2024-01-01: qty 60

## 2024-01-01 NOTE — Progress Notes (Addendum)
 [facility] [phone]   [phone]  PROGRESS NOTE  Patient Care Team: Levora Reyes SAUNDERS, MD as PCP - General (Family Medicine) Ladona Heinz, MD as PCP - Cardiology (Cardiology) Ladona Heinz, MD as Consulting Physician (Cardiology) Debarah Lorrene DEL., MD (Ophthalmology)  Hematological/Oncological History # Doris Wilkerson Multiple Myeloma 12/26/2020: MRI of pelvis showed lytic lesions on L4, L5, the sacrum, with pathologic fracture of left inferior pubic ramus. Concerning for multiple myeloma vs metastatic disease 12/31/2020: establish care with Dr. Federico. UPEP showed marked Bence Jones protein, findings concerning for multiple myleoma 01/21/2021: bone marrow biopsy confirms multiple myeloma with atypical plasma cells representing 28% of all cells in the aspirate  02/01/2021: Cycle 1 Day 1 of VRd chemotherapy 02/22/2021: Cycle 2 Day 1 of VRd chemotherapy 03/15/2021: Cycle 3 Day 1 of VRd chemotherapy 04/05/2021: Cycle 4 Day 1 of VRd chemotherapy 04/26/2021: Cycle 5 Day 1 of VRd chemotherapy 05/17/2021: Cycle 6 Day 1 of VRd chemotherapy 06/07/2021:  Cycle 7 Day 1 of VRd chemotherapy 06/28/2021: Cycle 8 Day 1 of VRd chemotherapy 07/19/2021: start maintenance revlimid  10mg  PO daily.  03/26/2022: Cycle 1 Day 1 of Dara/Pom/Dex 04/24/2023: Cycle 2 Day 1 of Dara/Pom/Dex 05/22/2023: Cycle 3 Day 1 of Dara/Pom/Dex 06/19/2023: Cycle 4 Day 1 of Dara/Pom/Dex 07/17/2023: Cycle 5 Day 1 of Dara/Pom/Dex 08/28/2023: d/c Dara/Pom/Dex due to progression.  09/11/2023: Cycle 1 Day 1 of Kyprolis /Dex 10/08/2023: Cycle 2 Day 1 of Kyprolis /Dex 11/06/2023: Cycle 3 Day 1 of Kyprolis /Dex 12/04/2023: Cycle 4 Day 1 of Kyprolis /Dex  Interval History:  Doris Wilkerson 78 y.o. female with medical history significant for lambda light Wilkerson multiple myeloma who presents for a follow up visit. She was last seen on 12/18/2023. In the interim has continued Kyprolis /Dex today. She is due for Cycle 5, Day 1  today.   On exam today Doris Wilkerson reports she has been well overall in the interim since her last visit with the exception of her COVID infection.  She reports that her symptoms have resolved at this time and she is no longer having any runny nose, sore throat, or cough.  She reports she is having some occasional bouts of shortness of breath and elevated blood pressure.  She has had no trouble with nausea, vomiting, or diarrhea.  She reports that she is tolerating her treatment well and is not having any major side effects as a result of her Kyprolis .  She reports that her appetite has been good and overall she is feeling well and in good spirits.  She has no other complaints. A full 10 point ROS is listed below.    MEDICAL HISTORY:  Past Medical History:  Diagnosis Date   Asthma    Cataract    GERD (gastroesophageal reflux disease)    Hyperlipidemia    Hypertension    Thyroid  disease     SURGICAL HISTORY: Past Surgical History:  Procedure Laterality Date   Cataract Surgery     CORONARY ANGIOPLASTY WITH STENT PLACEMENT     EYE SURGERY     IR IMAGING GUIDED PORT INSERTION  09/09/2023   ORTHOPEDIC SURGERY  2012   TONSILLECTOMY  1951    SOCIAL HISTORY: Social History   Socioeconomic History   Marital status: Married    Spouse name: Not on file   Number of children: 0   Years of education: Not on file   Highest education level: Master's degree (e.g., MA, MS, MEng, MEd, MSW, MBA)  Occupational History   Occupation:  unemployed  Tobacco Use   Smoking status: Never   Smokeless tobacco: Never  Vaping Use   Vaping status: Never Used  Substance and Sexual Activity   Alcohol use: Yes    Alcohol/week: 1.0 standard drink of alcohol    Types: 1 Glasses of wine per week    Comment: occassionally   Drug use: No   Sexual activity: Not Currently  Other Topics Concern   Not on file  Social History Narrative   Married. Education: college. Pt does exercise-   Social Drivers of Health    Financial Resource Strain: Low Risk  (04/24/2023)   Overall Financial Resource Strain (CARDIA)    Difficulty of Paying Living Expenses: Not hard at all  Food Insecurity: No Food Insecurity (04/24/2023)   Hunger Vital Sign    Worried About Running Out of Food in the Last Year: Never true    Ran Out of Food in the Last Year: Never true  Transportation Needs: No Transportation Needs (04/24/2023)   PRAPARE - Administrator, Civil Service (Medical): No    Lack of Transportation (Non-Medical): No  Physical Activity: Insufficiently Active (04/24/2023)   Exercise Vital Sign    Days of Exercise per Week: 7 days    Minutes of Exercise per Session: 20 min  Stress: No Stress Concern Present (04/24/2023)   [employer] of Occupational Health - Occupational Stress Questionnaire    Feeling of Stress : Only a little  Social Connections: Moderately Integrated (04/24/2023)   Social Connection and Isolation Panel [NHANES]    Frequency of Communication with Friends and Family: Twice a week    Frequency of Social Gatherings with Friends and Family: Once a week    Attends Religious Services: Never    Database Administrator or Organizations: Yes    Attends Engineer, Structural: More than 4 times per year    Marital Status: Married  Catering Manager Violence: Not At Risk (04/08/2023)   Humiliation, Afraid, Rape, and Kick questionnaire    Fear of Current or Ex-Partner: No    Emotionally Abused: No    Physically Abused: No    Sexually Abused: No    FAMILY HISTORY: Family History  Problem Relation Age of Onset   Cancer Father    Heart disease Brother    Dementia Brother    Leukemia Brother    Breast cancer Neg Hx    Colon cancer Neg Hx    Esophageal cancer Neg Hx    Pancreatic cancer Neg Hx    Rectal cancer Neg Hx    Stomach cancer Neg Hx     ALLERGIES:  is allergic to poison ivy extract, plavix [clopidogrel bisulfate], and other.  MEDICATIONS:  Current Outpatient  Medications  Medication Sig Dispense Refill   acyclovir  (ZOVIRAX ) 400 MG tablet Take 1 tablet (400 mg total) by mouth 2 (two) times daily. 180 tablet 2   amLODipine  (NORVASC ) 5 MG tablet Take 1 tablet (5 mg total) by mouth daily. 90 tablet 1   aspirin 81 MG tablet Take 81 mg by mouth daily.      atorvastatin  (LIPITOR) 80 MG tablet TAKE 1 TABLET(80 MG) BY MOUTH DAILY AT 6 PM 90 tablet 1   benazepril  (LOTENSIN ) 10 MG tablet TAKE 1 TABLET(10 MG) BY MOUTH DAILY 90 tablet 1   Blood Glucose Monitoring Suppl DEVI 1 each by Does not apply route as needed (symptoms of low blood sugar.). May substitute to any manufacturer covered by patient's insurance. 1  each 0   carfilzomib  (KYPROLIS ) 10 MG SOLR Inject 70 mg into the vein once a week.     cholecalciferol (VITAMIN D3) 25 MCG (1000 UNIT) tablet Take 2,000 Units by mouth daily.     Continuous Glucose Sensor (FREESTYLE LIBRE 3 SENSOR) MISC 1 Device by Does not apply route continuous. Place 1 sensor on the skin every 14 days. Use to check glucose continuously 2 each 11   dexamethasone  (DECADRON ) 4 MG tablet Take 10 tablets (40 mg total) by mouth once a week. 30 tablet 5   famotidine (PEPCID) 20 MG tablet Take 20 mg by mouth daily.     gabapentin  (NEURONTIN ) 100 MG capsule TAKE 1 CAPSULE(100 MG) BY MOUTH TWICE DAILY AS NEEDED 180 capsule 1   levothyroxine  (SYNTHROID ) 25 MCG tablet TAKE 1 TABLET(25 MCG) BY MOUTH DAILY BEFORE BREAKFAST 90 tablet 3   lidocaine -prilocaine  (EMLA ) cream Apply 1 Application topically as needed. 30 g 0   Magnesium 100 MG TABS Take 100 mg by mouth daily.     nitroGLYCERIN  (NITROSTAT ) 0.4 MG SL tablet Place 1 tablet (0.4 mg total) under the tongue every 5 (five) minutes as needed for chest pain. 25 tablet 1   polyethylene glycol (MIRALAX / GLYCOLAX) 17 g packet Take 17 g by mouth daily as needed.     No current facility-administered medications for this visit.   Facility-Administered Medications Ordered in Other Visits  Medication  Dose Route Frequency Provider Last Rate Last Admin   sodium chloride  flush (NS) 0.9 % injection 10 mL  10 mL Intracatheter PRN Natsuko Kelsay T IV, MD   10 mL at 01/01/24 1553    REVIEW OF SYSTEMS:   Constitutional: ( - ) fevers, ( - )  chills , ( - ) night sweats Eyes: ( - ) blurriness of vision, ( - ) double vision, ( - ) watery eyes Ears, nose, mouth, throat, and face: ( - ) mucositis, ( - ) sore throat Respiratory: ( - ) cough, ( - ) dyspnea, ( - ) wheezes Cardiovascular: ( - ) palpitation, ( - ) chest discomfort, ( - ) lower extremity swelling Gastrointestinal:  ( - ) nausea, ( - ) heartburn, ( - ) change in bowel habits Skin: ( - ) abnormal skin rashes Lymphatics: ( - ) new lymphadenopathy, ( - ) easy bruising Neurological: ( +) numbness, ( - ) tingling, ( - ) new weaknesses Behavioral/Psych: ( - ) mood change, ( - ) new changes  All other systems were reviewed with the patient and are negative.  PHYSICAL EXAMINATION: ECOG PERFORMANCE STATUS: 1 - Symptomatic but completely ambulatory  There were no vitals filed for this visit.      There were no vitals filed for this visit.    GENERAL: well appearing elderly Caucasian female in NAD  SKIN: skin color, texture, turgor are normal, no rashes or significant lesions EYES: conjunctiva are pink and non-injected, sclera clear LUNGS: clear to auscultation and percussion with normal breathing effort HEART: regular rate & rhythm and no murmurs and trace lower extremity edema Musculoskeletal: no cyanosis of digits and no clubbing  PSYCH: alert & oriented x 3, fluent speech NEURO: no focal motor/sensory deficits  LABORATORY DATA:  I have reviewed the data as listed    Latest Ref Rng & Units 01/01/2024    1:15 PM 12/18/2023    1:16 PM 12/11/2023    1:41 PM  CBC  WBC 4.0 - 10.5 K/uL 6.8  8.1  5.9  Hemoglobin 12.0 - 15.0 g/dL 88.7  89.2  88.7   Hematocrit 36.0 - 46.0 % 33.9  31.9  34.2   Platelets 150 - 400 K/uL 474  143  181         Latest Ref Rng & Units 01/01/2024    1:15 PM 12/18/2023    1:16 PM 12/11/2023    1:41 PM  CMP  Glucose 70 - 99 mg/dL 842  897  895   BUN 8 - 23 mg/dL 18  15  25    Creatinine 0.44 - 1.00 mg/dL 9.26  9.37  9.28   Sodium 135 - 145 mmol/L 137  137  138   Potassium 3.5 - 5.1 mmol/L 3.7  3.9  4.1   Chloride 98 - 111 mmol/L 107  108  106   CO2 22 - 32 mmol/L 25  25  26    Calcium  8.9 - 10.3 mg/dL 9.7  9.5  9.7   Total Protein 6.5 - 8.1 g/dL 6.3  5.9  5.9   Total Bilirubin 0.0 - 1.2 mg/dL 0.6  0.8  0.9   Alkaline Phos 38 - 126 U/L 61  59  57   AST 15 - 41 U/L 23  18  19    ALT 0 - 44 U/L 19  20  17      Lab Results  Component Value Date   MPROTEIN Not Observed 12/18/2023   MPROTEIN Not Observed 12/04/2023   MPROTEIN 0.1 (H) 11/20/2023   Lab Results  Component Value Date   KPAFRELGTCHN 4.5 12/18/2023   KPAFRELGTCHN 4.2 12/04/2023   KPAFRELGTCHN 4.1 11/20/2023   LAMBDASER 55.2 (H) 12/18/2023   LAMBDASER 52.7 (H) 12/04/2023   LAMBDASER 48.3 (H) 11/20/2023   KAPLAMBRATIO 0.08 (L) 12/18/2023   KAPLAMBRATIO 0.08 (L) 12/04/2023   KAPLAMBRATIO 0.08 (L) 11/20/2023    RADIOGRAPHIC STUDIES: No results found.   ASSESSMENT & PLAN Doris Wilkerson is a 78 y.o. female with medical history significant for lambda light Wilkerson multiple myeloma who presents for a follow up visit. Doris Wilkerson is not a candidate for bone marrow transplant based on her advanced age.    # Lambda Light Wilkerson Multiple Myeloma--VGPR on maintenance --diagnosis confirmed with bone marrow biopsy and urine protein analysis.   --patient has good functional status and would be an excellent candidate for VRd chemotherapy. I do not believe she would be a bone marrow transplant candidate based on her age.  --Received VRD chemotherapy from 02/01/2021-07/12/2021 then started maintenance Revlimid  on 07/19/2021.  --Due to rising lambda light Wilkerson, recommended to switch to Daratumumab /Pomalyst /Dexamethasone . --Received Dara/Pom/Dex  from 03/27/2023-08/28/2023. --Due to rising lambda light Wilkerson, recommend to switch to Kyprolis /Dex 3 weeks on 1 week off, starting today. Plan: --Due to start Cycle 5, Day 1 of [place]/Dex today --Labs today show white blood cell count 6.8, hemoglobin 11.2, MCV 96.6, platelets 474. Creatinine and LFTs are within normal limits. Myeloma labs pending today.  --Plan to obtain myeloma labs every cycle.  --Proceed with treatment today without any dose modifications.  --Return to clinic weekly for labs/treatment and follow up visit in 2 weeks  #Hypoglycemia/Hyperglycemia --Glucose was 157 today.  Notified patient that her blood sugars were high.  She reports that she ate Carnation instant breakfast this morning and thinks that may have caused her blood sugars to spike.  She that she will monitor her blood sugars more closely. --Patient is asymptomatic today. Encouraged to eat right before treatment and snacks every 2-3 hours.  --Patient will check  her glucose levels on non-treatment days and follow up with PCP if non-fasting glucose is consistently 70 or below.  #Back Pain/Left Hip Pain  -- Currently well controlled on gabapentin  and Tylenol  --CT thoracic and lumbar spine showed no lytic lesions or sequelae of multiple myeloma --MRI thoracic and lumbar spine from 07/07/2022 for baseline imaging. Findings showed multiple bone lesions involving posterior T9 and T8, lumbar spine and upper sacrum consistent with multiple myeloma. No extension into the canal. No pathologic fracture.  --continue follow up with Dr .Arley Helling, neurosugery --Continue to monitor  #Supportive Care --chemotherapy education complete  --port placement complete on 09/09/2023. EMLA  cream sent today.  --zofran  8mg  q8H PRN and compazine  10mg  PO q6H for nausea -- acyclovir  400mg  PO BID for VCZ prophylaxis --2 year of zometa  therapy completed in July 2024.  -- no prescription pain medication required at this time.   No orders of  the defined types were placed in this encounter.  All questions were answered. The patient knows to call the clinic with any problems, questions or concerns.  I have spent a total of 30 minutes minutes of face-to-face and non-face-to-face time, preparing to see the patient, performing a medically appropriate examination, counseling and educating the patient, documenting clinical information in the electronic health record.   Norleen IVAR Kidney, MD Department of Hematology/Oncology [facility] Phone: [phone] Pager: [phone] Email: [email or web address]  01/01/2024 4:08 PM

## 2024-01-01 NOTE — Progress Notes (Signed)
 Per patient, steroid taken prior to arrival

## 2024-01-01 NOTE — Patient Instructions (Signed)
 [facility] - A DEPT OF [facility]  Discharge Instructions: Thank you for choosing [facility] to provide your oncology and hematology care.   If you have a lab appointment with the Cancer Center, please go directly to the Cancer Center and check in at the registration area.   Wear comfortable clothing and clothing appropriate for easy access to any Portacath or PICC line.   We strive to give you quality time with your provider. You may need to reschedule your appointment if you arrive late (15 or more minutes).  Arriving late affects you and other patients whose appointments are after yours.  Also, if you miss three or more appointments without notifying the office, you may be dismissed from the clinic at the provider's discretion.      For prescription refill requests, have your pharmacy contact our office and allow 72 hours for refills to be completed.    Today you received the following chemotherapy and/or immunotherapy agents: carfilzomib      To help prevent nausea and vomiting after your treatment, we encourage you to take your nausea medication as directed.  BELOW ARE SYMPTOMS THAT SHOULD BE REPORTED IMMEDIATELY: *FEVER GREATER THAN 100.4 F (38 C) OR HIGHER *CHILLS OR SWEATING *NAUSEA AND VOMITING THAT IS NOT CONTROLLED WITH YOUR NAUSEA MEDICATION *UNUSUAL SHORTNESS OF BREATH *UNUSUAL BRUISING OR BLEEDING *URINARY PROBLEMS (pain or burning when urinating, or frequent urination) *BOWEL PROBLEMS (unusual diarrhea, constipation, pain near the anus) TENDERNESS IN MOUTH AND THROAT WITH OR WITHOUT PRESENCE OF ULCERS (sore throat, sores in mouth, or a toothache) UNUSUAL RASH, SWELLING OR PAIN  UNUSUAL VAGINAL DISCHARGE OR ITCHING   Items with * indicate a potential emergency and should be followed up as soon as possible or go to the Emergency Department if any problems should occur.  Please show the CHEMOTHERAPY ALERT CARD or  IMMUNOTHERAPY ALERT CARD at check-in to the Emergency Department and triage nurse.  Should you have questions after your visit or need to cancel or reschedule your appointment, please contact [facility] - A DEPT OF [facility]  Dept: [phone]  and follow the prompts.  Office hours are 8:00 a.m. to 4:30 p.m. Monday - Friday. Please note that voicemails left after 4:00 p.m. may not be returned until the following business day.  We are closed weekends and major holidays. You have access to a nurse at all times for urgent questions. Please call the main number to the clinic Dept: [phone] and follow the prompts.   For any non-urgent questions, you may also contact your provider using MyChart. We now offer e-Visits for anyone 73 and older to request care online for non-urgent symptoms. For details visit [email or web address].   Also download the MyChart app! Go to the app store, search "MyChart", open the app, select [place], and log in with your MyChart username and password.

## 2024-01-04 LAB — KAPPA/LAMBDA LIGHT CHAINS
Kappa free light chain: 5.4 mg/L (ref 3.3–19.4)
Kappa, lambda light chain ratio: 0.11 — ABNORMAL LOW (ref 0.26–1.65)
Lambda free light chains: 50.3 mg/L — ABNORMAL HIGH (ref 5.7–26.3)

## 2024-01-05 ENCOUNTER — Telehealth: Payer: Medicare Other | Admitting: "Endocrinology

## 2024-01-06 ENCOUNTER — Encounter: Payer: Self-pay | Admitting: Hematology and Oncology

## 2024-01-07 ENCOUNTER — Encounter: Payer: Self-pay | Admitting: Family Medicine

## 2024-01-07 ENCOUNTER — Ambulatory Visit: Payer: Medicare Other | Admitting: Family Medicine

## 2024-01-07 VITALS — BP 124/60 | HR 94 | Temp 98.2°F | Ht 66.0 in | Wt 138.6 lb

## 2024-01-07 DIAGNOSIS — E782 Mixed hyperlipidemia: Secondary | ICD-10-CM | POA: Diagnosis not present

## 2024-01-07 DIAGNOSIS — G6289 Other specified polyneuropathies: Secondary | ICD-10-CM | POA: Diagnosis not present

## 2024-01-07 DIAGNOSIS — R7303 Prediabetes: Secondary | ICD-10-CM

## 2024-01-07 DIAGNOSIS — I10 Essential (primary) hypertension: Secondary | ICD-10-CM

## 2024-01-07 DIAGNOSIS — E039 Hypothyroidism, unspecified: Secondary | ICD-10-CM | POA: Diagnosis not present

## 2024-01-07 DIAGNOSIS — C9000 Multiple myeloma not having achieved remission: Secondary | ICD-10-CM | POA: Diagnosis not present

## 2024-01-07 LAB — LIPID PANEL
Cholesterol: 175 mg/dL (ref 0–200)
HDL: 89.2 mg/dL (ref >39.00)
LDL Cholesterol: 55 mg/dL (ref 0–99)
NonHDL: 85.59
Total CHOL/HDL Ratio: 2
Triglycerides: 154 mg/dL — ABNORMAL HIGH (ref 0.0–149.0)
VLDL: 30.8 mg/dL (ref 0.0–40.0)

## 2024-01-07 LAB — HEMOGLOBIN A1C: Hgb A1c MFr Bld: 4.6 % (ref 4.6–6.5)

## 2024-01-07 LAB — TSH: TSH: 1.48 u[IU]/mL (ref 0.35–5.50)

## 2024-01-07 MED ORDER — BENAZEPRIL HCL 10 MG PO TABS: ORAL_TABLET | ORAL | 1 refills | Status: DC

## 2024-01-07 MED ORDER — GABAPENTIN 100 MG PO CAPS
100.0000 mg | ORAL_CAPSULE | Freq: Two times a day (BID) | ORAL | 1 refills | Status: DC
Start: 2024-01-07 — End: 2024-04-06

## 2024-01-07 NOTE — Patient Instructions (Signed)
Thank you for coming today.  If any concerns on labs I will let you know, no med changes for now.  Keep follow-up with your care team as scheduled but let me know if there are any questions or anything I can help with.  Take Care!

## 2024-01-07 NOTE — Progress Notes (Signed)
Subjective:  Patient ID: Doris Wilkerson, female    DOB: 05-Dec-1945  Age: 78 y.o. MRN: [id number]  CC:  Chief Complaint  Patient presents with   Medical Management of Chronic Issues    Patient has recently had COVID but is feeling much better, started phase 3 Chemo since being seen last, no questions     HPI Doris Wilkerson presents for   Chronic medication follow-up.   COVID-19 infection See MyChart message from January 23, positive testing at [facility] day prior.  Cold symptoms with sore throat.  She was evaluated by oncology on January 24.  Improving at that time and lungs were clear. Saw [facility] urgent care on 12/19/22.  CXR ok. Treated with zpak, inhaler - used for about 5 days.  Feeling better. No residual symptoms. Back to baseline.   Multiple myeloma Followed by Dr. Leonides Schanz, last office visit 01/01/2024. Lambda light chain multiple myeloma.  Treated with Kyprolis dexamethasone.  Cycle 5-day 1 on 01/01/2024.  Recent COVID infection.  Episodic shortness of breath and elevated blood pressure.  On Kyprolis now through end of March.  Followed by neurosurgeon Dr. Wynetta Emery with back, left hip pain treated with Tylenol and gabapentin.  Zofran and Compazine for nausea with chemo, acyclovir 400 mg twice daily for varicella prophylaxis.  2 years of Zometa therapy completed in July 2024. Tylenol only when having pain - usually positional. Gabapentin BID.   Hyperglycemia Glucose 157 at her February 7 visit.  Nonfasting at that time.  Concern for hypoglycemia previously with treatment days.  A1c at prediabetes level in November.saw Dr. Roosevelt Locks with endocrine for low cortisol - no concerns or change in meds.   Lab Results  Component Value Date   HGBA1C 5.8 (A) 10/05/2023   Cardiac History of CAD, followed by cardiology, Dr. Jacinto Halim.  Appointment February 5 noted.  Continued on aspirin, atorvastatin, benazepril and amlodipine with nitroglycerin as needed for angina - only used once recently.   21-month  follow-up planned with cardiology.  BP Readings from Last 3 Encounters:  01/07/24 124/60  01/01/24 127/77  12/30/23 136/80   Lab Results  Component Value Date   CREATININE 0.73 01/01/2024   Lab Results  Component Value Date   CHOL 151 07/09/2023   HDL 76.40 07/09/2023   LDLCALC 63 07/09/2023   TRIG 56.0 07/09/2023   CHOLHDL 2 07/09/2023   Hypothyroidism: Lab Results  Component Value Date   TSH 1.63 07/09/2023  Synthroid every day.  Taking medication daily.  No new hot or cold intolerance. No new hair or skin changes, heart palpitations or new fatigue. No new weight changes.   Covid booster recommended in 3 months with recent infection.    History Patient Active Problem List   Diagnosis Date Noted   Port-A-Cath in place 09/11/2023   Mixed hyperlipidemia 01/11/2023   Essential hypertension 01/11/2023   Physical debility 03/21/2021   Dizziness 03/21/2021   Vertigo 03/21/2021   Anemia due to antineoplastic chemotherapy 03/21/2021   Skin changes related to chemotherapy 03/21/2021   Other constipation 03/21/2021   Multiple myeloma not having achieved remission (HCC) 01/27/2021   Hordeolum externum of left lower eyelid 06/28/2018   CAD (coronary artery disease) 12/09/2011   Dyslipidemia 12/09/2011   Osteopenia 12/09/2011   Hypothyroid 12/09/2011   Asthma 12/09/2011   Past Medical History:  Diagnosis Date   Asthma    Cataract    GERD (gastroesophageal reflux disease)    Hyperlipidemia    Hypertension  Thyroid disease    Past Surgical History:  Procedure Laterality Date   Cataract Surgery     CORONARY ANGIOPLASTY WITH STENT PLACEMENT     EYE SURGERY     IR IMAGING GUIDED PORT INSERTION  09/09/2023   ORTHOPEDIC SURGERY  2012   TONSILLECTOMY  1951   Allergies  Allergen Reactions   Poison Ivy Extract Swelling   Plavix [Clopidogrel Bisulfate]    Other     seasonal   Prior to Admission medications   Medication Sig Start Date End Date Taking?  Authorizing Provider  acyclovir (ZOVIRAX) 400 MG tablet Take 1 tablet (400 mg total) by mouth 2 (two) times daily. 09/08/23  Yes Jaci Standard, MD  aspirin 81 MG tablet Take 81 mg by mouth daily.    Yes Dois Davenport, MD  atorvastatin (LIPITOR) 80 MG tablet TAKE 1 TABLET(80 MG) BY MOUTH DAILY AT 6 PM 12/17/23  Yes Shade Flood, MD  benazepril (LOTENSIN) 10 MG tablet TAKE 1 TABLET(10 MG) BY MOUTH DAILY 07/09/23  Yes Shade Flood, MD  Blood Glucose Monitoring Suppl DEVI 1 each by Does not apply route as needed (symptoms of low blood sugar.). May substitute to any manufacturer covered by patient's insurance. 05/11/23  Yes Shade Flood, MD  carfilzomib (KYPROLIS) 10 MG SOLR Inject 70 mg into the vein once a week.   Yes [provider]  cholecalciferol (VITAMIN D3) 25 MCG (1000 UNIT) tablet Take 2,000 Units by mouth daily.   Yes [provider]  Continuous Glucose Sensor (FREESTYLE LIBRE 3 SENSOR) MISC 1 Device by Does not apply route continuous. Place 1 sensor on the skin every 14 days. Use to check glucose continuously 10/05/23  Yes Motwani, Komal, MD  dexamethasone (DECADRON) 4 MG tablet Take 10 tablets (40 mg total) by mouth once a week. 09/11/23  Yes Georga Kaufmann T, PA-C  famotidine (PEPCID) 20 MG tablet Take 20 mg by mouth daily.   Yes [provider]  gabapentin (NEURONTIN) 100 MG capsule TAKE 1 CAPSULE(100 MG) BY MOUTH TWICE DAILY AS NEEDED 10/08/23  Yes Shade Flood, MD  levothyroxine (SYNTHROID) 25 MCG tablet TAKE 1 TABLET(25 MCG) BY MOUTH DAILY BEFORE BREAKFAST 09/15/23  Yes Sheliah Hatch, MD  lidocaine-prilocaine (EMLA) cream Apply 1 Application topically as needed. 09/11/23  Yes Briant Cedar, PA-C  Magnesium 100 MG TABS Take 100 mg by mouth daily.   Yes [provider]  polyethylene glycol (MIRALAX / GLYCOLAX) 17 g packet Take 17 g by mouth daily as needed.   Yes [provider]  amLODipine (NORVASC) 5 MG tablet  Take 1 tablet (5 mg total) by mouth daily. 09/28/23 12/30/23  Yates Decamp, MD  nitroGLYCERIN (NITROSTAT) 0.4 MG SL tablet Place 1 tablet (0.4 mg total) under the tongue every 5 (five) minutes as needed for chest pain. 07/13/23 12/30/23  Yates Decamp, MD   Social History   Socioeconomic History   Marital status: Married    Spouse name: Not on file   Number of children: 0   Years of education: Not on file   Highest education level: Master's degree (e.g., MA, MS, MEng, MEd, MSW, MBA)  Occupational History   Occupation: unemployed  Tobacco Use   Smoking status: Never   Smokeless tobacco: Never  Vaping Use   Vaping status: Never Used  Substance and Sexual Activity   Alcohol use: Yes    Alcohol/week: 1.0 standard drink of alcohol    Types: 1 Glasses  of wine per week    Comment: occassionally   Drug use: No   Sexual activity: Not Currently  Other Topics Concern   Not on file  Social History Narrative   Married. Education: college. Pt does exercise-   Social Drivers of Health   Financial Resource Strain: Low Risk  (01/04/2024)   Overall Financial Resource Strain (CARDIA)    Difficulty of Paying Living Expenses: Not hard at all  Food Insecurity: No Food Insecurity (01/04/2024)   Hunger Vital Sign    Worried About Running Out of Food in the Last Year: Never true    Ran Out of Food in the Last Year: Never true  Transportation Needs: No Transportation Needs (01/04/2024)   PRAPARE - Administrator, Civil Service (Medical): No    Lack of Transportation (Non-Medical): No  Physical Activity: Inactive (01/04/2024)   Exercise Vital Sign    Days of Exercise per Week: 0 days    Minutes of Exercise per Session: 20 min  Stress: No Stress Concern Present (01/04/2024)   [employer] of Occupational Health - Occupational Stress Questionnaire    Feeling of Stress : Not at all  Social Connections: Moderately Integrated (01/04/2024)   Social Connection and Isolation Panel [NHANES]     Frequency of Communication with Friends and Family: Twice a week    Frequency of Social Gatherings with Friends and Family: Once a week    Attends Religious Services: Never    Database administrator or Organizations: Yes    Attends Engineer, structural: More than 4 times per year    Marital Status: Married  Catering manager Violence: Not At Risk (04/08/2023)   Humiliation, Afraid, Rape, and Kick questionnaire    Fear of Current or Ex-Partner: No    Emotionally Abused: No    Physically Abused: No    Sexually Abused: No    Review of Systems  Per HPI.   Objective:   Vitals:   01/07/24 0823  BP: 124/60  Pulse: 94  Temp: 98.2 F (36.8 C)  TempSrc: Temporal  SpO2: 99%  Weight: 138 lb 9.6 oz (62.9 kg)  Height: 5\' 6"  (1.676 m)     Physical Exam Vitals reviewed.  Constitutional:      Appearance: Normal appearance. She is well-developed.  HENT:     Head: Normocephalic and atraumatic.  Eyes:     Conjunctiva/sclera: Conjunctivae normal.     Pupils: Pupils are equal, round, and reactive to light.  Neck:     Vascular: No carotid bruit.  Cardiovascular:     Rate and Rhythm: Normal rate and regular rhythm.     Heart sounds: Normal heart sounds.  Pulmonary:     Effort: Pulmonary effort is normal.     Breath sounds: Normal breath sounds. No wheezing, rhonchi or rales.  Abdominal:     Palpations: Abdomen is soft. There is no pulsatile mass.     Tenderness: There is no abdominal tenderness.  Musculoskeletal:     Right lower leg: No edema.     Left lower leg: No edema.  Skin:    General: Skin is warm and dry.  Neurological:     Mental Status: She is alert and oriented to person, place, and time.  Psychiatric:        Mood and Affect: Mood normal.        Behavior: Behavior normal.        Assessment & Plan:  Doris Wilkerson is a 78  y.o. female . Hypothyroidism, unspecified type - Plan: TSH  -Clinically stable, check TSH, has refills of Synthroid, continue  same dose for now and adjust plan accordingly based on lab results.  Essential hypertension - Plan: benazepril (LOTENSIN) 10 MG tablet  -Stable, recent cardiology visit noted.  Continue same regimen.  Recent CMP, creatinine noted.  Hyperlipemia, mixed - Plan: benazepril (LOTENSIN) 10 MG tablet, Lipid panel, CANCELED: Comprehensive metabolic panel  -Tolerating statin, check labs and adjust plan accordingly.  Recent cardiology visit noted.  Prediabetes - Plan: Hemoglobin A1c  -Recent hyperglycemia nonfasting, check A1c and adjust plan accordingly.  No new meds for now.  Multiple myeloma not having achieved remission (HCC)  -Followed by hematology/oncology as above with recent med change.  Continue close follow-up and monitoring per hematology.   Mixed hyperlipidemia - Plan: Lipid panel  -As above  Other polyneuropathy - Plan: gabapentin (NEURONTIN) 100 MG capsule  -Stable with twice daily gabapentin, Tylenol as needed with RTC precautions if new or worsening pain.  Meds ordered this encounter  Medications   benazepril (LOTENSIN) 10 MG tablet    Sig: TAKE 1 TABLET(10 MG) BY MOUTH DAILY    Dispense:  90 tablet    Refill:  1    Ok to place on hold   gabapentin (NEURONTIN) 100 MG capsule    Sig: Take 1 capsule (100 mg total) by mouth 2 (two) times daily.    Dispense:  180 capsule    Refill:  1   Patient Instructions  Thank you for coming today.  If any concerns on labs I will let you know, no med changes for now.  Keep follow-up with your care team as scheduled but let me know if there are any questions or anything I can help with.  Take Care!    Signed,   Meredith Staggers, MD [facility], [facility] 01/07/24 8:59 AM

## 2024-01-08 ENCOUNTER — Inpatient Hospital Stay: Payer: Medicare Other

## 2024-01-08 VITALS — BP 126/69 | HR 80 | Temp 98.1°F

## 2024-01-08 DIAGNOSIS — C9000 Multiple myeloma not having achieved remission: Secondary | ICD-10-CM

## 2024-01-08 DIAGNOSIS — Z7982 Long term (current) use of aspirin: Secondary | ICD-10-CM | POA: Diagnosis not present

## 2024-01-08 DIAGNOSIS — Z79899 Other long term (current) drug therapy: Secondary | ICD-10-CM | POA: Diagnosis not present

## 2024-01-08 DIAGNOSIS — Z7961 Long term (current) use of immunomodulator: Secondary | ICD-10-CM | POA: Diagnosis not present

## 2024-01-08 DIAGNOSIS — Z5112 Encounter for antineoplastic immunotherapy: Secondary | ICD-10-CM | POA: Diagnosis not present

## 2024-01-08 DIAGNOSIS — Z95828 Presence of other vascular implants and grafts: Secondary | ICD-10-CM

## 2024-01-08 DIAGNOSIS — Z79624 Long term (current) use of inhibitors of nucleotide synthesis: Secondary | ICD-10-CM | POA: Diagnosis not present

## 2024-01-08 LAB — CBC WITH DIFFERENTIAL (CANCER CENTER ONLY)
Abs Immature Granulocytes: 0.02 10*3/uL (ref 0.00–0.07)
Basophils Absolute: 0 10*3/uL (ref 0.0–0.1)
Basophils Relative: 0 %
Eosinophils Absolute: 0 10*3/uL (ref 0.0–0.5)
Eosinophils Relative: 0 %
HCT: 34.7 % — ABNORMAL LOW (ref 36.0–46.0)
Hemoglobin: 11.5 g/dL — ABNORMAL LOW (ref 12.0–15.0)
Immature Granulocytes: 0 %
Lymphocytes Relative: 5 %
Lymphs Abs: 0.2 10*3/uL — ABNORMAL LOW (ref 0.7–4.0)
MCH: 32.5 pg (ref 26.0–34.0)
MCHC: 33.1 g/dL (ref 30.0–36.0)
MCV: 98 fL (ref 80.0–100.0)
Monocytes Absolute: 0.1 10*3/uL (ref 0.1–1.0)
Monocytes Relative: 1 %
Neutro Abs: 4.6 10*3/uL (ref 1.7–7.7)
Neutrophils Relative %: 94 %
Platelet Count: 181 10*3/uL (ref 150–400)
RBC: 3.54 MIL/uL — ABNORMAL LOW (ref 3.87–5.11)
RDW: 12.7 % (ref 11.5–15.5)
WBC Count: 4.9 10*3/uL (ref 4.0–10.5)
nRBC: 0 % (ref 0.0–0.2)

## 2024-01-08 LAB — CMP (CANCER CENTER ONLY)
ALT: 18 U/L (ref 0–44)
AST: 18 U/L (ref 15–41)
Albumin: 4.3 g/dL (ref 3.5–5.0)
Alkaline Phosphatase: 52 U/L (ref 38–126)
Anion gap: 6 (ref 5–15)
BUN: 19 mg/dL (ref 8–23)
CO2: 26 mmol/L (ref 22–32)
Calcium: 9.9 mg/dL (ref 8.9–10.3)
Chloride: 106 mmol/L (ref 98–111)
Creatinine: 0.82 mg/dL (ref 0.44–1.00)
GFR, Estimated: >60 mL/min (ref >60)
Glucose, Bld: 159 mg/dL — ABNORMAL HIGH (ref 70–99)
Potassium: 4.2 mmol/L (ref 3.5–5.1)
Sodium: 138 mmol/L (ref 135–145)
Total Bilirubin: 0.7 mg/dL (ref 0.0–1.2)
Total Protein: 6.2 g/dL — ABNORMAL LOW (ref 6.5–8.1)

## 2024-01-08 MED ORDER — SODIUM CHLORIDE 0.9% FLUSH
10.0000 mL | Freq: Once | INTRAVENOUS | Status: AC
  Administered 2024-01-08: 10 mL

## 2024-01-08 MED ORDER — SODIUM CHLORIDE 0.9 % IV SOLN: INTRAVENOUS | Status: DC

## 2024-01-08 MED ORDER — DEXTROSE 5 % IV SOLN
70.0000 mg/m2 | Freq: Once | INTRAVENOUS | Status: AC
  Administered 2024-01-08: 120 mg via INTRAVENOUS
  Filled 2024-01-08: qty 60

## 2024-01-08 MED ORDER — HEPARIN SOD (PORK) LOCK FLUSH 100 UNIT/ML IV SOLN: 500.0000 [IU] | Freq: Once | INTRAVENOUS | Status: DC | PRN

## 2024-01-08 MED ORDER — SODIUM CHLORIDE 0.9% FLUSH: 10.0000 mL | INTRAVENOUS | Status: DC | PRN

## 2024-01-08 MED ORDER — SODIUM CHLORIDE 0.9 % IV SOLN
Freq: Once | INTRAVENOUS | Status: AC
Start: 2024-01-08 — End: 2024-01-08

## 2024-01-08 NOTE — Patient Instructions (Signed)

## 2024-01-08 NOTE — Progress Notes (Signed)
Patient states she took Dexamethasone at home prior to appointment

## 2024-01-09 ENCOUNTER — Other Ambulatory Visit: Payer: Self-pay

## 2024-01-12 ENCOUNTER — Encounter: Payer: Self-pay | Admitting: Family Medicine

## 2024-01-12 LAB — MULTIPLE MYELOMA PANEL, SERUM
Albumin SerPl Elph-Mcnc: 3.9 g/dL (ref 2.9–4.4)
Albumin/Glob SerPl: 2 — ABNORMAL HIGH (ref 0.7–1.7)
Alpha 1: 0.2 g/dL (ref 0.0–0.4)
Alpha2 Glob SerPl Elph-Mcnc: 0.6 g/dL (ref 0.4–1.0)
B-Globulin SerPl Elph-Mcnc: 0.9 g/dL (ref 0.7–1.3)
Gamma Glob SerPl Elph-Mcnc: 0.2 g/dL — ABNORMAL LOW (ref 0.4–1.8)
Globulin, Total: 2 g/dL — ABNORMAL LOW (ref 2.2–3.9)
IgA: 40 mg/dL — ABNORMAL LOW (ref 64–422)
IgG (Immunoglobin G), Serum: 305 mg/dL — ABNORMAL LOW (ref 586–1602)
IgM (Immunoglobulin M), Srm: 7 mg/dL — ABNORMAL LOW (ref 26–217)
Total Protein ELP: 5.9 g/dL — ABNORMAL LOW (ref 6.0–8.5)

## 2024-01-15 ENCOUNTER — Inpatient Hospital Stay: Payer: Medicare Other

## 2024-01-15 ENCOUNTER — Inpatient Hospital Stay (HOSPITAL_BASED_OUTPATIENT_CLINIC_OR_DEPARTMENT_OTHER): Payer: Medicare Other | Admitting: Hematology and Oncology

## 2024-01-15 VITALS — BP 130/68 | HR 96 | Temp 98.7°F | Resp 16 | Wt 138.2 lb

## 2024-01-15 DIAGNOSIS — Z7961 Long term (current) use of immunomodulator: Secondary | ICD-10-CM | POA: Diagnosis not present

## 2024-01-15 DIAGNOSIS — Z79899 Other long term (current) drug therapy: Secondary | ICD-10-CM | POA: Diagnosis not present

## 2024-01-15 DIAGNOSIS — C9000 Multiple myeloma not having achieved remission: Secondary | ICD-10-CM

## 2024-01-15 DIAGNOSIS — Z95828 Presence of other vascular implants and grafts: Secondary | ICD-10-CM

## 2024-01-15 DIAGNOSIS — M899 Disorder of bone, unspecified: Secondary | ICD-10-CM

## 2024-01-15 DIAGNOSIS — Z5112 Encounter for antineoplastic immunotherapy: Secondary | ICD-10-CM | POA: Diagnosis not present

## 2024-01-15 DIAGNOSIS — Z79624 Long term (current) use of inhibitors of nucleotide synthesis: Secondary | ICD-10-CM | POA: Diagnosis not present

## 2024-01-15 DIAGNOSIS — Z7982 Long term (current) use of aspirin: Secondary | ICD-10-CM | POA: Diagnosis not present

## 2024-01-15 LAB — CBC WITH DIFFERENTIAL (CANCER CENTER ONLY)
Abs Immature Granulocytes: 0.04 10*3/uL (ref 0.00–0.07)
Basophils Absolute: 0 10*3/uL (ref 0.0–0.1)
Basophils Relative: 0 %
Eosinophils Absolute: 0 10*3/uL (ref 0.0–0.5)
Eosinophils Relative: 0 %
HCT: 36.2 % (ref 36.0–46.0)
Hemoglobin: 11.7 g/dL — ABNORMAL LOW (ref 12.0–15.0)
Immature Granulocytes: 1 %
Lymphocytes Relative: 4 %
Lymphs Abs: 0.2 10*3/uL — ABNORMAL LOW (ref 0.7–4.0)
MCH: 32 pg (ref 26.0–34.0)
MCHC: 32.3 g/dL (ref 30.0–36.0)
MCV: 98.9 fL (ref 80.0–100.0)
Monocytes Absolute: 0.2 10*3/uL (ref 0.1–1.0)
Monocytes Relative: 3 %
Neutro Abs: 5.5 10*3/uL (ref 1.7–7.7)
Neutrophils Relative %: 92 %
Platelet Count: 146 10*3/uL — ABNORMAL LOW (ref 150–400)
RBC: 3.66 MIL/uL — ABNORMAL LOW (ref 3.87–5.11)
RDW: 13 % (ref 11.5–15.5)
WBC Count: 6 10*3/uL (ref 4.0–10.5)
nRBC: 0 % (ref 0.0–0.2)

## 2024-01-15 LAB — CMP (CANCER CENTER ONLY)
ALT: 17 U/L (ref 0–44)
AST: 18 U/L (ref 15–41)
Albumin: 4.3 g/dL (ref 3.5–5.0)
Alkaline Phosphatase: 50 U/L (ref 38–126)
Anion gap: 5 (ref 5–15)
BUN: 19 mg/dL (ref 8–23)
CO2: 24 mmol/L (ref 22–32)
Calcium: 9.7 mg/dL (ref 8.9–10.3)
Chloride: 108 mmol/L (ref 98–111)
Creatinine: 0.67 mg/dL (ref 0.44–1.00)
GFR, Estimated: >60 mL/min (ref >60)
Glucose, Bld: 111 mg/dL — ABNORMAL HIGH (ref 70–99)
Potassium: 3.8 mmol/L (ref 3.5–5.1)
Sodium: 137 mmol/L (ref 135–145)
Total Bilirubin: 0.9 mg/dL (ref 0.0–1.2)
Total Protein: 6 g/dL — ABNORMAL LOW (ref 6.5–8.1)

## 2024-01-15 MED ORDER — SODIUM CHLORIDE 0.9 % IV SOLN: INTRAVENOUS | Status: DC

## 2024-01-15 MED ORDER — DEXTROSE 5 % IV SOLN
70.0000 mg/m2 | Freq: Once | INTRAVENOUS | Status: AC
  Administered 2024-01-15: 120 mg via INTRAVENOUS
  Filled 2024-01-15: qty 60

## 2024-01-15 MED ORDER — SODIUM CHLORIDE 0.9% FLUSH
10.0000 mL | Freq: Once | INTRAVENOUS | Status: AC
  Administered 2024-01-15: 10 mL

## 2024-01-15 MED ORDER — HEPARIN SOD (PORK) LOCK FLUSH 100 UNIT/ML IV SOLN
500.0000 [IU] | Freq: Once | INTRAVENOUS | Status: AC | PRN
  Administered 2024-01-15: 500 [IU]

## 2024-01-15 MED ORDER — SODIUM CHLORIDE 0.9 % IV SOLN
Freq: Once | INTRAVENOUS | Status: AC
Start: 2024-01-15 — End: 2024-01-15

## 2024-01-15 MED ORDER — SODIUM CHLORIDE 0.9% FLUSH
10.0000 mL | INTRAVENOUS | Status: DC | PRN
  Administered 2024-01-15: 10 mL

## 2024-01-15 NOTE — Patient Instructions (Signed)

## 2024-01-15 NOTE — Progress Notes (Signed)
[facility] [phone]   [phone]  PROGRESS NOTE  Patient Care Team: Shade Flood, MD as PCP - General (Family Medicine) Yates Decamp, MD as PCP - Cardiology (Cardiology) Yates Decamp, MD as Consulting Physician (Cardiology) Marzella Schlein., MD (Ophthalmology)  Hematological/Oncological History # Doris Wilkerson Chain Multiple Myeloma 12/26/2020: MRI of pelvis showed lytic lesions on L4, L5, the sacrum, with pathologic fracture of left inferior pubic ramus. Concerning for multiple myeloma vs metastatic disease 12/31/2020: establish care with Dr. Leonides Schanz. UPEP showed marked Bence Jones protein, findings concerning for multiple myleoma 01/21/2021: bone marrow biopsy confirms multiple myeloma with atypical plasma cells representing 28% of all cells in the aspirate  02/01/2021: Cycle 1 Day 1 of VRd chemotherapy 02/22/2021: Cycle 2 Day 1 of VRd chemotherapy 03/15/2021: Cycle 3 Day 1 of VRd chemotherapy 04/05/2021: Cycle 4 Day 1 of VRd chemotherapy 04/26/2021: Cycle 5 Day 1 of VRd chemotherapy 05/17/2021: Cycle 6 Day 1 of VRd chemotherapy 06/07/2021:  Cycle 7 Day 1 of VRd chemotherapy 06/28/2021: Cycle 8 Day 1 of VRd chemotherapy 07/19/2021: start maintenance revlimid 10mg  PO daily.  03/26/2022: Cycle 1 Day 1 of Dara/Pom/Dex 04/24/2023: Cycle 2 Day 1 of Dara/Pom/Dex 05/22/2023: Cycle 3 Day 1 of Dara/Pom/Dex 06/19/2023: Cycle 4 Day 1 of Dara/Pom/Dex 07/17/2023: Cycle 5 Day 1 of Dara/Pom/Dex 08/28/2023: d/c Dara/Pom/Dex due to progression.  09/11/2023: Cycle 1 Day 1 of Kyprolis/Dex 10/08/2023: Cycle 2 Day 1 of Kyprolis/Dex 11/06/2023: Cycle 3 Day 1 of Kyprolis/Dex 12/04/2023: Cycle 4 Day 1 of Kyprolis/Dex  Interval History:  Doris Wilkerson 78 y.o. female with medical history significant for lambda light chain multiple myeloma who presents for a follow up visit. She was last seen on 01/01/2024. In the interim has continued Kyprolis/Dex today. She is due for Cycle 5, Day 15  today.   On exam today [name] reports she has been doing pretty well overall since her last visit.  She reports she feels fatigued on Mondays but on Saturday and Sunday due to the steroid that she is a "crazy person".  She knows she has a lot of energy on those days.  She reports is not having any major side effects as result of her Kyprolis treatment.  She reports that she feels like she did before the COVID infection.  She notes that she sometimes have some shortness of breath on exertion.  She reports recently her primary doctor told her she had elevated triglycerides but not to be alarmed by it.  Said no other major changes in health.  She denies any new aches or pains in her bones.  She reports that she is tolerating her treatment well and is not having any major side effects as a result of her Kyprolis.  She reports that her appetite has been good and overall she is feeling well and in good spirits.  She has no other complaints. A full 10 point ROS is listed below.    MEDICAL HISTORY:  Past Medical History:  Diagnosis Date   Asthma    Cataract    GERD (gastroesophageal reflux disease)    Hyperlipidemia    Hypertension    Thyroid disease     SURGICAL HISTORY: Past Surgical History:  Procedure Laterality Date   Cataract Surgery     CORONARY ANGIOPLASTY WITH STENT PLACEMENT     EYE SURGERY     IR IMAGING GUIDED PORT INSERTION  09/09/2023   ORTHOPEDIC SURGERY  2012   TONSILLECTOMY  1951  SOCIAL HISTORY: Social History   Socioeconomic History   Marital status: Married    Spouse name: Not on file   Number of children: 0   Years of education: Not on file   Highest education level: Master's degree (e.g., MA, MS, MEng, MEd, MSW, MBA)  Occupational History   Occupation: unemployed  Tobacco Use   Smoking status: Never   Smokeless tobacco: Never  Vaping Use   Vaping status: Never Used  Substance and Sexual Activity   Alcohol use: Yes    Alcohol/week: 1.0 standard drink of  alcohol    Types: 1 Glasses of wine per week    Comment: occassionally   Drug use: No   Sexual activity: Not Currently  Other Topics Concern   Not on file  Social History Narrative   Married. Education: college. Pt does exercise-   Social Drivers of Health   Financial Resource Strain: Low Risk  (01/04/2024)   Overall Financial Resource Strain (CARDIA)    Difficulty of Paying Living Expenses: Not hard at all  Food Insecurity: No Food Insecurity (01/04/2024)   Hunger Vital Sign    Worried About Running Out of Food in the Last Year: Never true    Ran Out of Food in the Last Year: Never true  Transportation Needs: No Transportation Needs (01/04/2024)   PRAPARE - Administrator, Civil Service (Medical): No    Lack of Transportation (Non-Medical): No  Physical Activity: Inactive (01/04/2024)   Exercise Vital Sign    Days of Exercise per Week: 0 days    Minutes of Exercise per Session: 20 min  Stress: No Stress Concern Present (01/04/2024)   [employer] of Occupational Health - Occupational Stress Questionnaire    Feeling of Stress : Not at all  Social Connections: Moderately Integrated (01/04/2024)   Social Connection and Isolation Panel [NHANES]    Frequency of Communication with Friends and Family: Twice a week    Frequency of Social Gatherings with Friends and Family: Once a week    Attends Religious Services: Never    Database administrator or Organizations: Yes    Attends Engineer, structural: More than 4 times per year    Marital Status: Married  Catering manager Violence: Not At Risk (04/08/2023)   Humiliation, Afraid, Rape, and Kick questionnaire    Fear of Current or Ex-Partner: No    Emotionally Abused: No    Physically Abused: No    Sexually Abused: No    FAMILY HISTORY: Family History  Problem Relation Age of Onset   Cancer Father    Heart disease Brother    Dementia Brother    Leukemia Brother    Breast cancer Neg Hx    Colon cancer Neg  Hx    Esophageal cancer Neg Hx    Pancreatic cancer Neg Hx    Rectal cancer Neg Hx    Stomach cancer Neg Hx     ALLERGIES:  is allergic to poison ivy extract, plavix [clopidogrel bisulfate], and other.  MEDICATIONS:  Current Outpatient Medications  Medication Sig Dispense Refill   acyclovir (ZOVIRAX) 400 MG tablet Take 1 tablet (400 mg total) by mouth 2 (two) times daily. 180 tablet 2   amLODipine (NORVASC) 5 MG tablet Take 1 tablet (5 mg total) by mouth daily. 90 tablet 1   aspirin 81 MG tablet Take 81 mg by mouth daily.      atorvastatin (LIPITOR) 80 MG tablet TAKE 1 TABLET(80 MG) BY MOUTH DAILY  AT 6 PM 90 tablet 1   benazepril (LOTENSIN) 10 MG tablet TAKE 1 TABLET(10 MG) BY MOUTH DAILY 90 tablet 1   Blood Glucose Monitoring Suppl DEVI 1 each by Does not apply route as needed (symptoms of low blood sugar.). May substitute to any manufacturer covered by patient's insurance. 1 each 0   carfilzomib (KYPROLIS) 10 MG SOLR Inject 70 mg into the vein once a week.     cholecalciferol (VITAMIN D3) 25 MCG (1000 UNIT) tablet Take 2,000 Units by mouth daily.     Continuous Glucose Sensor (FREESTYLE LIBRE 3 SENSOR) MISC 1 Device by Does not apply route continuous. Place 1 sensor on the skin every 14 days. Use to check glucose continuously 2 each 11   dexamethasone (DECADRON) 4 MG tablet Take 10 tablets (40 mg total) by mouth once a week. 30 tablet 5   famotidine (PEPCID) 20 MG tablet Take 20 mg by mouth daily.     gabapentin (NEURONTIN) 100 MG capsule Take 1 capsule (100 mg total) by mouth 2 (two) times daily. 180 capsule 1   levothyroxine (SYNTHROID) 25 MCG tablet TAKE 1 TABLET(25 MCG) BY MOUTH DAILY BEFORE BREAKFAST 90 tablet 3   lidocaine-prilocaine (EMLA) cream Apply 1 Application topically as needed. 30 g 0   Magnesium 100 MG TABS Take 100 mg by mouth daily.     nitroGLYCERIN (NITROSTAT) 0.4 MG SL tablet Place 1 tablet (0.4 mg total) under the tongue every 5 (five) minutes as needed for chest  pain. 25 tablet 1   polyethylene glycol (MIRALAX / GLYCOLAX) 17 g packet Take 17 g by mouth daily as needed.     No current facility-administered medications for this visit.   Facility-Administered Medications Ordered in Other Visits  Medication Dose Route Frequency Provider Last Rate Last Admin   0.9 %  sodium chloride infusion   Intravenous Continuous Jaci Standard, MD   Stopped at 01/15/24 1240   sodium chloride flush (NS) 0.9 % injection 10 mL  10 mL Intracatheter PRN Jaci Standard, MD   10 mL at 01/15/24 1237    REVIEW OF SYSTEMS:   Constitutional: ( - ) fevers, ( - )  chills , ( - ) night sweats Eyes: ( - ) blurriness of vision, ( - ) double vision, ( - ) watery eyes Ears, nose, mouth, throat, and face: ( - ) mucositis, ( - ) sore throat Respiratory: ( - ) cough, ( - ) dyspnea, ( - ) wheezes Cardiovascular: ( - ) palpitation, ( - ) chest discomfort, ( - ) lower extremity swelling Gastrointestinal:  ( - ) nausea, ( - ) heartburn, ( - ) change in bowel habits Skin: ( - ) abnormal skin rashes Lymphatics: ( - ) new lymphadenopathy, ( - ) easy bruising Neurological: ( +) numbness, ( - ) tingling, ( - ) new weaknesses Behavioral/Psych: ( - ) mood change, ( - ) new changes  All other systems were reviewed with the patient and are negative.  PHYSICAL EXAMINATION: ECOG PERFORMANCE STATUS: 1 - Symptomatic but completely ambulatory  Vitals:   01/15/24 1036  BP: 130/68  Pulse: 96  Resp: 16  Temp: 98.7 F (37.1 C)  SpO2: 100%        Filed Weights   01/15/24 1036  Weight: 138 lb 3.2 oz (62.7 kg)      GENERAL: well appearing elderly Caucasian female in NAD  SKIN: skin color, texture, turgor are normal, no rashes or significant lesions EYES:  conjunctiva are pink and non-injected, sclera clear LUNGS: clear to auscultation and percussion with normal breathing effort HEART: regular rate & rhythm and no murmurs and trace lower extremity edema Musculoskeletal: no  cyanosis of digits and no clubbing  PSYCH: alert & oriented x 3, fluent speech NEURO: no focal motor/sensory deficits  LABORATORY DATA:  I have reviewed the data as listed    Latest Ref Rng & Units 01/15/2024   10:13 AM 01/08/2024    1:40 PM 01/01/2024    1:15 PM  CBC  WBC 4.0 - 10.5 K/uL 6.0  4.9  6.8   Hemoglobin 12.0 - 15.0 g/dL 45.4  09.8  11.9   Hematocrit 36.0 - 46.0 % 36.2  34.7  33.9   Platelets 150 - 400 K/uL 146  181  474        Latest Ref Rng & Units 01/15/2024   10:13 AM 01/08/2024    1:40 PM 01/01/2024    1:15 PM  CMP  Glucose 70 - 99 mg/dL 147  829  562   BUN 8 - 23 mg/dL 19  19  18    Creatinine 0.44 - 1.00 mg/dL 1.30  8.65  7.84   Sodium 135 - 145 mmol/L 137  138  137   Potassium 3.5 - 5.1 mmol/L 3.8  4.2  3.7   Chloride 98 - 111 mmol/L 108  106  107   CO2 22 - 32 mmol/L 24  26  25    Calcium 8.9 - 10.3 mg/dL 9.7  9.9  9.7   Total Protein 6.5 - 8.1 g/dL 6.0  6.2  6.3   Total Bilirubin 0.0 - 1.2 mg/dL 0.9  0.7  0.6   Alkaline Phos 38 - 126 U/L 50  52  61   AST 15 - 41 U/L 18  18  23    ALT 0 - 44 U/L 17  18  19      Lab Results  Component Value Date   MPROTEIN Not Observed 01/01/2024   MPROTEIN Not Observed 12/18/2023   MPROTEIN Not Observed 12/04/2023   Lab Results  Component Value Date   KPAFRELGTCHN 5.4 01/01/2024   KPAFRELGTCHN 4.5 12/18/2023   KPAFRELGTCHN 4.2 12/04/2023   LAMBDASER 50.3 (H) 01/01/2024   LAMBDASER 55.2 (H) 12/18/2023   LAMBDASER 52.7 (H) 12/04/2023   KAPLAMBRATIO 0.11 (L) 01/01/2024   KAPLAMBRATIO 0.08 (L) 12/18/2023   KAPLAMBRATIO 0.08 (L) 12/04/2023    RADIOGRAPHIC STUDIES: No results found.   ASSESSMENT & PLAN Radley Teston is a 78 y.o. female with medical history significant for lambda light chain multiple myeloma who presents for a follow up visit. Ms. Saffran is not a candidate for bone marrow transplant based on her advanced age.    # Lambda Light Chain Multiple Myeloma--VGPR on maintenance --diagnosis confirmed with  bone marrow biopsy and urine protein analysis.   --patient has good functional status and would be an excellent candidate for VRd chemotherapy. I do not believe she would be a bone marrow transplant candidate based on her age.  --Received VRD chemotherapy from 02/01/2021-07/12/2021 then started maintenance Revlimid on 07/19/2021.  --Due to rising lambda light chain, recommended to switch to Daratumumab/Pomalyst/Dexamethasone. --Received Dara/Pom/Dex from 03/27/2023-08/28/2023. --Due to rising lambda light chain, recommend to switch to Kyprolis/Dex 3 weeks on 1 week off, starting today. Plan: --Due to start Cycle 5, Day 15 of [place]/Dex today --Labs today show white blood cell count 6.0, Hgb 11.7, MCV 98.9, Plt 146. Creatinine and LFTs are within normal limits.  Myeloma labs pending today.  --Plan to obtain myeloma labs every cycle.  --Proceed with treatment today without any dose modifications.  --Return to clinic weekly for labs/treatment and follow up visit in 2 weeks  #Hypoglycemia/Hyperglycemia --Glucose was 111 today.  Notified patient that her blood sugars were high.  She reports that she ate Carnation instant breakfast this morning and thinks that may have caused her blood sugars to spike.  She that she will monitor her blood sugars more closely. --Patient is asymptomatic today. Encouraged to eat right before treatment and snacks every 2-3 hours.  --Patient will check her glucose levels on non-treatment days and follow up with PCP if non-fasting glucose is consistently 70 or below.  #Back Pain/Left Hip Pain  -- Currently well controlled on gabapentin and Tylenol --CT thoracic and lumbar spine showed no lytic lesions or sequelae of multiple myeloma --MRI thoracic and lumbar spine from 07/07/2022 for baseline imaging. Findings showed multiple bone lesions involving posterior T9 and T8, lumbar spine and upper sacrum consistent with multiple myeloma. No extension into the canal. No pathologic  fracture.  --continue follow up with Dr .Donalee Citrin, neurosugery --Continue to monitor  #Supportive Care --chemotherapy education complete  --port placement complete on 09/09/2023. EMLA cream sent today.  --zofran 8mg  q8H PRN and compazine 10mg  PO q6H for nausea -- acyclovir 400mg  PO BID for VCZ prophylaxis --2 year of zometa therapy completed in July 2024.  -- no prescription pain medication required at this time.   Orders Placed This Encounter  Procedures   Multiple Myeloma Panel (SPEP&IFE w/QIG)    Standing Status:   Future    Expected Date:   02/26/2024    Expiration Date:   02/25/2025   Kappa/lambda light chains    Standing Status:   Future    Expected Date:   02/26/2024    Expiration Date:   02/25/2025   CBC with Differential (Cancer Center Only)    Standing Status:   Future    Expected Date:   02/26/2024    Expiration Date:   02/25/2025   CMP (Cancer Center only)    Standing Status:   Future    Expected Date:   02/26/2024    Expiration Date:   02/25/2025   CBC with Differential (Cancer Center Only)    Standing Status:   Future    Expected Date:   03/04/2024    Expiration Date:   03/04/2025   CMP (Cancer Center only)    Standing Status:   Future    Expected Date:   03/04/2024    Expiration Date:   03/04/2025   Multiple Myeloma Panel (SPEP&IFE w/QIG)    Standing Status:   Future    Expected Date:   03/11/2024    Expiration Date:   03/11/2025   Kappa/lambda light chains    Standing Status:   Future    Expected Date:   03/11/2024    Expiration Date:   03/11/2025   CBC with Differential (Cancer Center Only)    Standing Status:   Future    Expected Date:   03/11/2024    Expiration Date:   03/11/2025   CMP (Cancer Center only)    Standing Status:   Future    Expected Date:   03/11/2024    Expiration Date:   03/11/2025   Multiple Myeloma Panel (SPEP&IFE w/QIG)    Standing Status:   Future    Expected Date:   03/25/2024    Expiration Date:   03/25/2025  Kappa/lambda light chains    Standing  Status:   Future    Expected Date:   03/25/2024    Expiration Date:   03/25/2025   CBC with Differential (Cancer Center Only)    Standing Status:   Future    Expected Date:   03/25/2024    Expiration Date:   03/25/2025   CMP (Cancer Center only)    Standing Status:   Future    Expected Date:   03/25/2024    Expiration Date:   03/25/2025   CBC with Differential (Cancer Center Only)    Standing Status:   Future    Expected Date:   04/01/2024    Expiration Date:   04/01/2025   CMP (Cancer Center only)    Standing Status:   Future    Expected Date:   04/01/2024    Expiration Date:   04/01/2025   Multiple Myeloma Panel (SPEP&IFE w/QIG)    Standing Status:   Future    Expected Date:   04/08/2024    Expiration Date:   04/08/2025   Kappa/lambda light chains    Standing Status:   Future    Expected Date:   04/08/2024    Expiration Date:   04/08/2025   CBC with Differential (Cancer Center Only)    Standing Status:   Future    Expected Date:   04/08/2024    Expiration Date:   04/08/2025   CMP (Cancer Center only)    Standing Status:   Future    Expected Date:   04/08/2024    Expiration Date:   04/08/2025   Multiple Myeloma Panel (SPEP&IFE w/QIG)    Standing Status:   Future    Expected Date:   04/22/2024    Expiration Date:   04/22/2025   Kappa/lambda light chains    Standing Status:   Future    Expected Date:   04/22/2024    Expiration Date:   04/22/2025   CBC with Differential (Cancer Center Only)    Standing Status:   Future    Expected Date:   04/22/2024    Expiration Date:   04/22/2025   CMP (Cancer Center only)    Standing Status:   Future    Expected Date:   04/22/2024    Expiration Date:   04/22/2025   CBC with Differential (Cancer Center Only)    Standing Status:   Future    Expected Date:   04/29/2024    Expiration Date:   04/29/2025   CMP (Cancer Center only)    Standing Status:   Future    Expected Date:   04/29/2024    Expiration Date:   04/29/2025   Multiple Myeloma Panel (SPEP&IFE w/QIG)     Standing Status:   Future    Expected Date:   05/06/2024    Expiration Date:   05/06/2025   Kappa/lambda light chains    Standing Status:   Future    Expected Date:   05/06/2024    Expiration Date:   05/06/2025   CBC with Differential (Cancer Center Only)    Standing Status:   Future    Expected Date:   05/06/2024    Expiration Date:   05/06/2025   CMP (Cancer Center only)    Standing Status:   Future    Expected Date:   05/06/2024    Expiration Date:   05/06/2025   All questions were answered. The patient knows to call the clinic with any problems, questions or concerns.  I have spent a total of 30 minutes minutes of face-to-face and non-face-to-face time, preparing to see the patient, performing a medically appropriate examination, counseling and educating the patient, documenting clinical information in the electronic health record.   Ulysees Barns, MD Department of Hematology/Oncology [facility] Phone: [phone] Pager: [phone] Email: [email or web address]  01/15/2024 5:09 PM

## 2024-01-15 NOTE — Progress Notes (Signed)
Patient took dex at home around [facility]

## 2024-01-18 LAB — KAPPA/LAMBDA LIGHT CHAINS
Kappa free light chain: 3.9 mg/L (ref 3.3–19.4)
Kappa, lambda light chain ratio: 0.07 — ABNORMAL LOW (ref 0.26–1.65)
Lambda free light chains: 58.8 mg/L — ABNORMAL HIGH (ref 5.7–26.3)

## 2024-01-19 LAB — MULTIPLE MYELOMA PANEL, SERUM
Albumin SerPl Elph-Mcnc: 3.9 g/dL (ref 2.9–4.4)
Albumin/Glob SerPl: 2.2 — ABNORMAL HIGH (ref 0.7–1.7)
Alpha 1: 0.2 g/dL (ref 0.0–0.4)
Alpha2 Glob SerPl Elph-Mcnc: 0.5 g/dL (ref 0.4–1.0)
B-Globulin SerPl Elph-Mcnc: 0.8 g/dL (ref 0.7–1.3)
Gamma Glob SerPl Elph-Mcnc: 0.3 g/dL — ABNORMAL LOW (ref 0.4–1.8)
Globulin, Total: 1.8 g/dL — ABNORMAL LOW (ref 2.2–3.9)
IgA: 25 mg/dL — ABNORMAL LOW (ref 64–422)
IgG (Immunoglobin G), Serum: 284 mg/dL — ABNORMAL LOW (ref 586–1602)
IgM (Immunoglobulin M), Srm: 9 mg/dL — ABNORMAL LOW (ref 26–217)
Total Protein ELP: 5.7 g/dL — ABNORMAL LOW (ref 6.0–8.5)

## 2024-01-26 ENCOUNTER — Encounter: Payer: Self-pay | Admitting: Hematology and Oncology

## 2024-01-26 NOTE — Progress Notes (Signed)
 [facility] OFFICE PROGRESS NOTE  Shade Flood, MD [address] [place] [address]  DIAGNOSIS:  Hematological/Oncological History # Lambda Light Chain Multiple Myeloma 12/26/2020: MRI of pelvis showed lytic lesions on L4, L5, the sacrum, with pathologic fracture of left inferior pubic ramus. Concerning for multiple myeloma vs metastatic disease 12/31/2020: establish care with Dr. Leonides Schanz. UPEP showed marked Bence Jones protein, findings concerning for multiple myleoma 01/21/2021: bone marrow biopsy confirms multiple myeloma with atypical plasma cells representing 28% of all cells in the aspirate  02/01/2021: Cycle 1 Day 1 of VRd chemotherapy 02/22/2021: Cycle 2 Day 1 of VRd chemotherapy 03/15/2021: Cycle 3 Day 1 of VRd chemotherapy 04/05/2021: Cycle 4 Day 1 of VRd chemotherapy 04/26/2021: Cycle 5 Day 1 of VRd chemotherapy 05/17/2021: Cycle 6 Day 1 of VRd chemotherapy 06/07/2021:  Cycle 7 Day 1 of VRd chemotherapy 06/28/2021: Cycle 8 Day 1 of VRd chemotherapy 07/19/2021: start maintenance revlimid 10mg  PO daily.  03/26/2022: Cycle 1 Day 1 of Dara/Pom/Dex 04/24/2023: Cycle 2 Day 1 of Dara/Pom/Dex 05/22/2023: Cycle 3 Day 1 of Dara/Pom/Dex 06/19/2023: Cycle 4 Day 1 of Dara/Pom/Dex 07/17/2023: Cycle 5 Day 1 of Dara/Pom/Dex 08/28/2023: d/c Dara/Pom/Dex due to progression.  09/11/2023: Cycle 1 Day 1 of Kyprolis/Dex 10/08/2023: Cycle 2 Day 1 of Kyprolis/Dex 11/06/2023: Cycle 3 Day 1 of Kyprolis/Dex 12/04/2023: Cycle 4 Day 1 of Kyprolis/Dex 01/01/2024: Cycle 5 Day 1 of Kyprolis/Dex 01/29/2024: Cycle 6 Day 1 of Kyprolis/Dex  CURRENT THERAPY: Cycle 6 Day 1 of Kyprolis/Dex scheduled for today.   INTERVAL HISTORY: Doris Wilkerson 78 y.o. female returns to the clinic today for a follow-up visit.  She was last seen in the clinic by Dr. Leonides Schanz on 01/15/2024.  She is currently undergoing treatment with Kyprolis and Dex.  She is here today for day 1 cycle #6.   He denies any major changes in her  health since she was last seen.  Overall, she states "I think I am doing well". She does get some fatigue post treatment but she manages it with taking naps in the afternoon. She took her 40 mg of decadron this morning as prescribed. She denies any fever, night sweats, or unexplained weight loss.  She reports stable dyspnea on exertion but still is able to perform her daily functions and hobbies such as gardening, cooking, and walking her dog. Denies any chest pain.  Denies any signs and symptoms of infection including sore throat, nasal congestion, skin infections, dysuria, or abdominal pain besides having COVID-19 last month. She does sometimes get diarrhea the week following treatment. If she has over 2-3 loose stools, she will take 1/2 a tablet of imodium which completely resolves her symptoms. Denies any abnormal bleeding or bruising.  Denies any unusual bone pain, she sometimes may get back pain if she is wearing a bra that is too tight but this is unchanged. She is here today for evaluation repeat blood work before undergoing her next cycle of treatment.   MEDICAL HISTORY: Past Medical History:  Diagnosis Date   Asthma    Cataract    GERD (gastroesophageal reflux disease)    Hyperlipidemia    Hypertension    Thyroid disease     ALLERGIES:  is allergic to poison ivy extract, plavix [clopidogrel bisulfate], and other.  MEDICATIONS:  Current Outpatient Medications  Medication Sig Dispense Refill   acyclovir (ZOVIRAX) 400 MG tablet Take 1 tablet (400 mg total) by mouth 2 (two) times daily. 180 tablet 2  amLODipine (NORVASC) 5 MG tablet Take 1 tablet (5 mg total) by mouth daily. 90 tablet 1   aspirin 81 MG tablet Take 81 mg by mouth daily.      atorvastatin (LIPITOR) 80 MG tablet TAKE 1 TABLET(80 MG) BY MOUTH DAILY AT 6 PM 90 tablet 1   benazepril (LOTENSIN) 10 MG tablet TAKE 1 TABLET(10 MG) BY MOUTH DAILY 90 tablet 1   Blood Glucose Monitoring Suppl DEVI 1 each by Does not apply route as  needed (symptoms of low blood sugar.). May substitute to any manufacturer covered by patient's insurance. 1 each 0   carfilzomib (KYPROLIS) 10 MG SOLR Inject 70 mg into the vein once a week.     cholecalciferol (VITAMIN D3) 25 MCG (1000 UNIT) tablet Take 2,000 Units by mouth daily.     Continuous Glucose Sensor (FREESTYLE LIBRE 3 SENSOR) MISC 1 Device by Does not apply route continuous. Place 1 sensor on the skin every 14 days. Use to check glucose continuously 2 each 11   dexamethasone (DECADRON) 4 MG tablet Take 10 tablets (40 mg total) by mouth once a week. 30 tablet 5   famotidine (PEPCID) 20 MG tablet Take 20 mg by mouth daily.     gabapentin (NEURONTIN) 100 MG capsule Take 1 capsule (100 mg total) by mouth 2 (two) times daily. 180 capsule 1   levothyroxine (SYNTHROID) 25 MCG tablet TAKE 1 TABLET(25 MCG) BY MOUTH DAILY BEFORE BREAKFAST 90 tablet 3   lidocaine-prilocaine (EMLA) cream Apply 1 Application topically as needed. 30 g 0   Magnesium 100 MG TABS Take 100 mg by mouth daily.     nitroGLYCERIN (NITROSTAT) 0.4 MG SL tablet Place 1 tablet (0.4 mg total) under the tongue every 5 (five) minutes as needed for chest pain. 25 tablet 1   polyethylene glycol (MIRALAX / GLYCOLAX) 17 g packet Take 17 g by mouth daily as needed.     No current facility-administered medications for this visit.    SURGICAL HISTORY:  Past Surgical History:  Procedure Laterality Date   Cataract Surgery     CORONARY ANGIOPLASTY WITH STENT PLACEMENT     EYE SURGERY     IR IMAGING GUIDED PORT INSERTION  09/09/2023   ORTHOPEDIC SURGERY  2012   TONSILLECTOMY  1951    REVIEW OF SYSTEMS:   Review of Systems  Constitutional: Positive for mild intermittent fatigue. Negative for appetite change, chills, fever and unexpected weight change.  HENT: Negative for mouth sores, nosebleeds, sore throat and trouble swallowing.   Eyes: Negative for eye problems and icterus.  Respiratory: Mild stable dyspnea on exertion.  Negative for cough, hemoptysis,  and wheezing.   Cardiovascular: Negative for chest pain and leg swelling.  Gastrointestinal: Negative for abdominal pain, constipation, diarrhea (none at this time), nausea and vomiting.  Genitourinary: Negative for bladder incontinence, difficulty urinating, dysuria, frequency and hematuria.   Musculoskeletal: Negative for back pain, gait problem, neck pain and neck stiffness.  Skin: Negative for itching and rash.  Neurological: Negative for dizziness, extremity weakness, gait problem, headaches, light-headedness and seizures.  Hematological: Negative for adenopathy. Does not bruise/bleed easily.  Psychiatric/Behavioral: Negative for confusion, depression and sleep disturbance. The patient is not nervous/anxious.     PHYSICAL EXAMINATION:  There were no vitals taken for this visit.  ECOG PERFORMANCE STATUS: 1  Physical Exam  Constitutional: Oriented to person, place, and time and thin appearing female well-nourished, and in no distress.  HENT:  Head: Normocephalic and atraumatic.  Mouth/Throat: Oropharynx is  clear and moist. No oropharyngeal exudate.  Eyes: Conjunctivae are normal. Right eye exhibits no discharge. Left eye exhibits no discharge. No scleral icterus.  Neck: Normal range of motion. Neck supple.  Cardiovascular: Normal rate, regular rhythm, normal heart sounds and intact distal pulses.   Pulmonary/Chest: Effort normal and breath sounds normal. No respiratory distress. No wheezes. No rales.  Abdominal: Soft. Bowel sounds are normal. Exhibits no distension and no mass. There is no tenderness.  Musculoskeletal: Normal range of motion. Exhibits no edema.  Lymphadenopathy:    No cervical adenopathy.  Neurological: Alert and oriented to person, place, and time. Exhibits normal muscle tone. Gait normal. Coordination normal.  Skin: Skin is warm and dry. No rash noted. Not diaphoretic. No erythema. No pallor.  Psychiatric: Mood, memory and judgment  normal.  Vitals reviewed.  LABORATORY DATA: Lab Results  Component Value Date   WBC 6.0 01/15/2024   HGB 11.7 (L) 01/15/2024   HCT 36.2 01/15/2024   MCV 98.9 01/15/2024   PLT 146 (L) 01/15/2024      Chemistry      Component Value Date/Time   NA 137 01/15/2024 1013   NA 140 12/06/2020 1031   K 3.8 01/15/2024 1013   CL 108 01/15/2024 1013   CO2 24 01/15/2024 1013   BUN 19 01/15/2024 1013   BUN 14 12/06/2020 1031   CREATININE 0.67 01/15/2024 1013   CREATININE 0.71 08/25/2016 1048      Component Value Date/Time   CALCIUM 9.7 01/15/2024 1013   ALKPHOS 50 01/15/2024 1013   AST 18 01/15/2024 1013   ALT 17 01/15/2024 1013   BILITOT 0.9 01/15/2024 1013       RADIOGRAPHIC STUDIES:  No results found.   ASSESSMENT/PLAN:  Doris Wilkerson is a 78 y.o. female with medical history significant for lambda light chain multiple myeloma who presents for a follow up visit. Ms. Luepke is not a candidate for bone marrow transplant based on her advanced age.   # Lambda Light Chain Multiple Myeloma--VGPR on maintenance --diagnosis confirmed with bone marrow biopsy and urine protein analysis.   --patient has good functional status and was deemed excellent candidate for VRd chemotherapy. Dr. Leonides Schanz did not believe she would be a bone marrow transplant candidate based on her age.  --Received VRD chemotherapy from 02/01/2021-07/12/2021 then started maintenance Revlimid on 07/19/2021.  --Due to rising lambda light chain, recommended to switch to Daratumumab/Pomalyst/Dexamethasone. --Received Dara/Pom/Dex from 03/27/2023-08/28/2023. --Due to rising lambda light chain, recommend to switch to Kyprolis/Dex 3 weeks on 1 week off.  Plan: --Due to start Cycle 6, Day 1 of [place]/Dex today --Labs today show white blood cell count 6.2, Hgb 12.0, MCV 98.1, Plt 397. Creatinine and LFTs are within normal limits. Myeloma labs pending today.  --Plan to obtain myeloma labs every cycle.  --Proceed with treatment  today without any dose modifications.  --Return to clinic weekly for labs/treatment and follow up visit in 2 weeks --The patient had send mychart message about bi-specific therapy. I let her know more to come on this in the future and Dr. Leonides Schanz may be able to give her more information regarding specifics and timeline at her future appointments   #Hypoglycemia/Hyperglycemia --Glucose was 109 today.    --Patient will check her glucose levels on non-treatment days and follow up with PCP if non-fasting glucose is consistently 70 or below.   #Back Pain/Left Hip Pain  -- Currently well controlled on gabapentin and Tylenol --MRI thoracic and lumbar spine from 07/07/2022 for baseline  imaging. Findings showed multiple bone lesions involving posterior T9 and T8, lumbar spine and upper sacrum consistent with multiple myeloma. No extension into the canal. No pathologic fracture.  --continue follow up with Dr .Donalee Citrin, neurosugery --Continue to monitor, she tells me today (01/29/24) she does not have any unusual pain   #Supportive Care --chemotherapy education complete  --port placement complete on 09/09/2023. Has EMLA cream  --zofran 8mg  q8H PRN and compazine 10mg  PO q6H for nausea -- acyclovir 400mg  PO BID for VCZ prophylaxis --2 year of zometa therapy completed in July 2024.  -- no prescription pain medication required at this time.       No orders of the defined types were placed in this encounter.    The total time spent in the appointment was 20-29 minutes  Shara Hartis L Laree Garron, PA-C 01/26/24

## 2024-01-29 ENCOUNTER — Inpatient Hospital Stay: Payer: Medicare Other

## 2024-01-29 ENCOUNTER — Encounter: Payer: Self-pay | Admitting: "Endocrinology

## 2024-01-29 ENCOUNTER — Inpatient Hospital Stay: Payer: Medicare Other | Attending: Physician Assistant

## 2024-01-29 ENCOUNTER — Inpatient Hospital Stay: Payer: Medicare Other | Admitting: Physician Assistant

## 2024-01-29 VITALS — BP 134/79 | HR 92 | Temp 98.5°F | Resp 16 | Wt 139.9 lb

## 2024-01-29 DIAGNOSIS — Z5112 Encounter for antineoplastic immunotherapy: Secondary | ICD-10-CM | POA: Diagnosis not present

## 2024-01-29 DIAGNOSIS — Z7982 Long term (current) use of aspirin: Secondary | ICD-10-CM | POA: Diagnosis not present

## 2024-01-29 DIAGNOSIS — Z79624 Long term (current) use of inhibitors of nucleotide synthesis: Secondary | ICD-10-CM | POA: Insufficient documentation

## 2024-01-29 DIAGNOSIS — Z79899 Other long term (current) drug therapy: Secondary | ICD-10-CM | POA: Diagnosis not present

## 2024-01-29 DIAGNOSIS — D6481 Anemia due to antineoplastic chemotherapy: Secondary | ICD-10-CM

## 2024-01-29 DIAGNOSIS — Z7961 Long term (current) use of immunomodulator: Secondary | ICD-10-CM | POA: Diagnosis not present

## 2024-01-29 DIAGNOSIS — T451X5A Adverse effect of antineoplastic and immunosuppressive drugs, initial encounter: Secondary | ICD-10-CM | POA: Diagnosis not present

## 2024-01-29 DIAGNOSIS — C9000 Multiple myeloma not having achieved remission: Secondary | ICD-10-CM

## 2024-01-29 DIAGNOSIS — Z95828 Presence of other vascular implants and grafts: Secondary | ICD-10-CM

## 2024-01-29 LAB — CMP (CANCER CENTER ONLY)
ALT: 16 U/L (ref 0–44)
AST: 20 U/L (ref 15–41)
Albumin: 4.4 g/dL (ref 3.5–5.0)
Alkaline Phosphatase: 53 U/L (ref 38–126)
Anion gap: 6 (ref 5–15)
BUN: 24 mg/dL — ABNORMAL HIGH (ref 8–23)
CO2: 26 mmol/L (ref 22–32)
Calcium: 9.5 mg/dL (ref 8.9–10.3)
Chloride: 107 mmol/L (ref 98–111)
Creatinine: 0.74 mg/dL (ref 0.44–1.00)
GFR, Estimated: >60 mL/min (ref >60)
Glucose, Bld: 109 mg/dL — ABNORMAL HIGH (ref 70–99)
Potassium: 4 mmol/L (ref 3.5–5.1)
Sodium: 139 mmol/L (ref 135–145)
Total Bilirubin: 0.9 mg/dL (ref 0.0–1.2)
Total Protein: 6.2 g/dL — ABNORMAL LOW (ref 6.5–8.1)

## 2024-01-29 LAB — CBC WITH DIFFERENTIAL (CANCER CENTER ONLY)
Abs Immature Granulocytes: 0.03 10*3/uL (ref 0.00–0.07)
Basophils Absolute: 0.1 10*3/uL (ref 0.0–0.1)
Basophils Relative: 1 %
Eosinophils Absolute: 0 10*3/uL (ref 0.0–0.5)
Eosinophils Relative: 0 %
HCT: 36.1 % (ref 36.0–46.0)
Hemoglobin: 12 g/dL (ref 12.0–15.0)
Immature Granulocytes: 1 %
Lymphocytes Relative: 4 %
Lymphs Abs: 0.2 10*3/uL — ABNORMAL LOW (ref 0.7–4.0)
MCH: 32.6 pg (ref 26.0–34.0)
MCHC: 33.2 g/dL (ref 30.0–36.0)
MCV: 98.1 fL (ref 80.0–100.0)
Monocytes Absolute: 0.1 10*3/uL (ref 0.1–1.0)
Monocytes Relative: 2 %
Neutro Abs: 5.7 10*3/uL (ref 1.7–7.7)
Neutrophils Relative %: 92 %
Platelet Count: 397 10*3/uL (ref 150–400)
RBC: 3.68 MIL/uL — ABNORMAL LOW (ref 3.87–5.11)
RDW: 12.7 % (ref 11.5–15.5)
WBC Count: 6.2 10*3/uL (ref 4.0–10.5)
nRBC: 0 % (ref 0.0–0.2)

## 2024-01-29 MED ORDER — SODIUM CHLORIDE 0.9% FLUSH
10.0000 mL | Freq: Once | INTRAVENOUS | Status: AC
  Administered 2024-01-29: 10 mL

## 2024-01-29 MED ORDER — SODIUM CHLORIDE 0.9 % IV SOLN: INTRAVENOUS | Status: DC

## 2024-01-29 MED ORDER — SODIUM CHLORIDE 0.9% FLUSH
10.0000 mL | INTRAVENOUS | Status: DC | PRN
  Administered 2024-01-29: 10 mL

## 2024-01-29 MED ORDER — DEXTROSE 5 % IV SOLN
70.0000 mg/m2 | Freq: Once | INTRAVENOUS | Status: AC
  Administered 2024-01-29: 120 mg via INTRAVENOUS
  Filled 2024-01-29: qty 60

## 2024-01-29 MED ORDER — SODIUM CHLORIDE 0.9 % IV SOLN: Freq: Once | INTRAVENOUS | Status: AC

## 2024-01-29 MED ORDER — HEPARIN SOD (PORK) LOCK FLUSH 100 UNIT/ML IV SOLN
500.0000 [IU] | Freq: Once | INTRAVENOUS | Status: AC | PRN
  Administered 2024-01-29: 500 [IU]

## 2024-01-29 NOTE — Patient Instructions (Signed)

## 2024-01-29 NOTE — Progress Notes (Signed)
 Patient states she took Dexamethasone at home prior to appointment today.

## 2024-02-01 LAB — MULTIPLE MYELOMA PANEL, SERUM
Albumin SerPl Elph-Mcnc: 3.9 g/dL (ref 2.9–4.4)
Albumin/Glob SerPl: 2 — ABNORMAL HIGH (ref 0.7–1.7)
Alpha 1: 0.2 g/dL (ref 0.0–0.4)
Alpha2 Glob SerPl Elph-Mcnc: 0.7 g/dL (ref 0.4–1.0)
B-Globulin SerPl Elph-Mcnc: 0.9 g/dL (ref 0.7–1.3)
Gamma Glob SerPl Elph-Mcnc: 0.2 g/dL — ABNORMAL LOW (ref 0.4–1.8)
Globulin, Total: 2 g/dL — ABNORMAL LOW (ref 2.2–3.9)
IgA: 21 mg/dL — ABNORMAL LOW (ref 64–422)
IgG (Immunoglobin G), Serum: 280 mg/dL — ABNORMAL LOW (ref 586–1602)
IgM (Immunoglobulin M), Srm: 6 mg/dL — ABNORMAL LOW (ref 26–217)
Total Protein ELP: 5.9 g/dL — ABNORMAL LOW (ref 6.0–8.5)

## 2024-02-01 LAB — KAPPA/LAMBDA LIGHT CHAINS
Kappa free light chain: 3.7 mg/L (ref 3.3–19.4)
Kappa, lambda light chain ratio: 0.04 — ABNORMAL LOW (ref 0.26–1.65)
Lambda free light chains: 95.6 mg/L — ABNORMAL HIGH (ref 5.7–26.3)

## 2024-02-05 ENCOUNTER — Inpatient Hospital Stay: Payer: Medicare Other

## 2024-02-05 VITALS — BP 132/72 | HR 91 | Temp 98.2°F | Resp 16 | Wt 139.5 lb

## 2024-02-05 DIAGNOSIS — C9000 Multiple myeloma not having achieved remission: Secondary | ICD-10-CM

## 2024-02-05 DIAGNOSIS — Z7961 Long term (current) use of immunomodulator: Secondary | ICD-10-CM | POA: Diagnosis not present

## 2024-02-05 DIAGNOSIS — Z79624 Long term (current) use of inhibitors of nucleotide synthesis: Secondary | ICD-10-CM | POA: Diagnosis not present

## 2024-02-05 DIAGNOSIS — Z79899 Other long term (current) drug therapy: Secondary | ICD-10-CM | POA: Diagnosis not present

## 2024-02-05 DIAGNOSIS — Z95828 Presence of other vascular implants and grafts: Secondary | ICD-10-CM

## 2024-02-05 DIAGNOSIS — Z7982 Long term (current) use of aspirin: Secondary | ICD-10-CM | POA: Diagnosis not present

## 2024-02-05 DIAGNOSIS — Z5112 Encounter for antineoplastic immunotherapy: Secondary | ICD-10-CM | POA: Diagnosis not present

## 2024-02-05 LAB — CBC WITH DIFFERENTIAL (CANCER CENTER ONLY)
Abs Immature Granulocytes: 0.02 10*3/uL (ref 0.00–0.07)
Basophils Absolute: 0 10*3/uL (ref 0.0–0.1)
Basophils Relative: 1 %
Eosinophils Absolute: 0 10*3/uL (ref 0.0–0.5)
Eosinophils Relative: 0 %
HCT: 37.1 % (ref 36.0–46.0)
Hemoglobin: 12.2 g/dL (ref 12.0–15.0)
Immature Granulocytes: 1 %
Lymphocytes Relative: 6 %
Lymphs Abs: 0.2 10*3/uL — ABNORMAL LOW (ref 0.7–4.0)
MCH: 31.9 pg (ref 26.0–34.0)
MCHC: 32.9 g/dL (ref 30.0–36.0)
MCV: 96.9 fL (ref 80.0–100.0)
Monocytes Absolute: 0.1 10*3/uL (ref 0.1–1.0)
Monocytes Relative: 2 %
Neutro Abs: 3.8 10*3/uL (ref 1.7–7.7)
Neutrophils Relative %: 90 %
Platelet Count: 184 10*3/uL (ref 150–400)
RBC: 3.83 MIL/uL — ABNORMAL LOW (ref 3.87–5.11)
RDW: 12.7 % (ref 11.5–15.5)
WBC Count: 4.2 10*3/uL (ref 4.0–10.5)
nRBC: 0 % (ref 0.0–0.2)

## 2024-02-05 LAB — CMP (CANCER CENTER ONLY)
ALT: 20 U/L (ref 0–44)
AST: 18 U/L (ref 15–41)
Albumin: 4.7 g/dL (ref 3.5–5.0)
Alkaline Phosphatase: 54 U/L (ref 38–126)
Anion gap: 5 (ref 5–15)
BUN: 18 mg/dL (ref 8–23)
CO2: 25 mmol/L (ref 22–32)
Calcium: 9.8 mg/dL (ref 8.9–10.3)
Chloride: 109 mmol/L (ref 98–111)
Creatinine: 0.61 mg/dL (ref 0.44–1.00)
GFR, Estimated: >60 mL/min (ref >60)
Glucose, Bld: 144 mg/dL — ABNORMAL HIGH (ref 70–99)
Potassium: 4 mmol/L (ref 3.5–5.1)
Sodium: 139 mmol/L (ref 135–145)
Total Bilirubin: 0.8 mg/dL (ref 0.0–1.2)
Total Protein: 6.6 g/dL (ref 6.5–8.1)

## 2024-02-05 MED ORDER — HEPARIN SOD (PORK) LOCK FLUSH 100 UNIT/ML IV SOLN: 500.0000 [IU] | Freq: Once | INTRAVENOUS | Status: DC | PRN

## 2024-02-05 MED ORDER — SODIUM CHLORIDE 0.9% FLUSH: 10.0000 mL | INTRAVENOUS | Status: DC | PRN

## 2024-02-05 MED ORDER — SODIUM CHLORIDE 0.9 % IV SOLN
Freq: Once | INTRAVENOUS | Status: AC
Start: 2024-02-05 — End: 2024-02-05

## 2024-02-05 MED ORDER — SODIUM CHLORIDE 0.9% FLUSH
10.0000 mL | Freq: Once | INTRAVENOUS | Status: AC
  Administered 2024-02-05: 10 mL

## 2024-02-05 MED ORDER — DEXTROSE 5 % IV SOLN
70.0000 mg/m2 | Freq: Once | INTRAVENOUS | Status: AC
  Administered 2024-02-05: 120 mg via INTRAVENOUS
  Filled 2024-02-05: qty 60

## 2024-02-05 MED ORDER — SODIUM CHLORIDE 0.9 % IV SOLN: INTRAVENOUS | Status: DC

## 2024-02-05 NOTE — Progress Notes (Signed)
 Patient took 40mg  of dexamethasone this morning.

## 2024-02-11 DIAGNOSIS — H04129 Dry eye syndrome of unspecified lacrimal gland: Secondary | ICD-10-CM | POA: Diagnosis not present

## 2024-02-11 DIAGNOSIS — H538 Other visual disturbances: Secondary | ICD-10-CM | POA: Diagnosis not present

## 2024-02-11 DIAGNOSIS — Z961 Presence of intraocular lens: Secondary | ICD-10-CM | POA: Diagnosis not present

## 2024-02-11 NOTE — Progress Notes (Signed)
 [facility] [phone]   [phone]  PROGRESS NOTE  Patient Care Team: Shade Flood, MD as PCP - General (Family Medicine) Yates Decamp, MD as PCP - Cardiology (Cardiology) Yates Decamp, MD as Consulting Physician (Cardiology) Marzella Schlein., MD (Ophthalmology)  Hematological/Oncological History # Burnadette Peter Chain Multiple Myeloma 12/26/2020: MRI of pelvis showed lytic lesions on L4, L5, the sacrum, with pathologic fracture of left inferior pubic ramus. Concerning for multiple myeloma vs metastatic disease 12/31/2020: establish care with Dr. Leonides Schanz. UPEP showed marked Bence Jones protein, findings concerning for multiple myleoma 01/21/2021: bone marrow biopsy confirms multiple myeloma with atypical plasma cells representing 28% of all cells in the aspirate  02/01/2021: Cycle 1 Day 1 of VRd chemotherapy 02/22/2021: Cycle 2 Day 1 of VRd chemotherapy 03/15/2021: Cycle 3 Day 1 of VRd chemotherapy 04/05/2021: Cycle 4 Day 1 of VRd chemotherapy 04/26/2021: Cycle 5 Day 1 of VRd chemotherapy 05/17/2021: Cycle 6 Day 1 of VRd chemotherapy 06/07/2021:  Cycle 7 Day 1 of VRd chemotherapy 06/28/2021: Cycle 8 Day 1 of VRd chemotherapy 07/19/2021: start maintenance revlimid 10mg  PO daily.  03/26/2022: Cycle 1 Day 1 of Dara/Pom/Dex 04/24/2023: Cycle 2 Day 1 of Dara/Pom/Dex 05/22/2023: Cycle 3 Day 1 of Dara/Pom/Dex 06/19/2023: Cycle 4 Day 1 of Dara/Pom/Dex 07/17/2023: Cycle 5 Day 1 of Dara/Pom/Dex 08/28/2023: d/c Dara/Pom/Dex due to progression.  09/11/2023: Cycle 1 Day 1 of Kyprolis/Dex 10/08/2023: Cycle 2 Day 1 of Kyprolis/Dex 11/06/2023: Cycle 3 Day 1 of Kyprolis/Dex 12/04/2023: Cycle 4 Day 1 of Kyprolis/Dex  Interval History:  Doris Wilkerson 78 y.o. female with medical history significant for lambda light chain multiple myeloma who presents for a follow up visit. She was last seen on 01/29/2024. In the interim has continued Kyprolis/Dex today. She is due for Cycle 6, Day 15  today.   On exam today [name] is accompanied by her husband.  She reports she has been well overall in the interim since her last visit with some occasional pains.  She does have some pain in her upper back for which she has been taking Tylenol.  She reports that she is interested in trying tramadol for better pain control.  She reports he is tolerating Kyprolis well with no major symptoms.  She reports that the steroids do cause her to have some occasional "mood swings".  She has been able to do her day-to-day activities without any difficulty.  She reports that her appetite has been good and overall she is feeling well and in good spirits.  She has no other complaints. A full 10 point ROS is listed below.    MEDICAL HISTORY:  Past Medical History:  Diagnosis Date   Asthma    Cataract    GERD (gastroesophageal reflux disease)    Hyperlipidemia    Hypertension    Thyroid disease     SURGICAL HISTORY: Past Surgical History:  Procedure Laterality Date   Cataract Surgery     CORONARY ANGIOPLASTY WITH STENT PLACEMENT     EYE SURGERY     IR IMAGING GUIDED PORT INSERTION  09/09/2023   ORTHOPEDIC SURGERY  2012   TONSILLECTOMY  1951    SOCIAL HISTORY: Social History   Socioeconomic History   Marital status: Married    Spouse name: Not on file   Number of children: 0   Years of education: Not on file   Highest education level: Master's degree (e.g., MA, MS, MEng, MEd, MSW, MBA)  Occupational History   Occupation: unemployed  Tobacco Use   Smoking status: Never   Smokeless tobacco: Never  Vaping Use   Vaping status: Never Used  Substance and Sexual Activity   Alcohol use: Yes    Alcohol/week: 1.0 standard drink of alcohol    Types: 1 Glasses of wine per week    Comment: occassionally   Drug use: No   Sexual activity: Not Currently  Other Topics Concern   Not on file  Social History Narrative   Married. Education: college. Pt does exercise-   Social Drivers of Health    Financial Resource Strain: Low Risk  (01/04/2024)   Overall Financial Resource Strain (CARDIA)    Difficulty of Paying Living Expenses: Not hard at all  Food Insecurity: No Food Insecurity (01/04/2024)   Hunger Vital Sign    Worried About Running Out of Food in the Last Year: Never true    Ran Out of Food in the Last Year: Never true  Transportation Needs: No Transportation Needs (01/04/2024)   PRAPARE - Administrator, Civil Service (Medical): No    Lack of Transportation (Non-Medical): No  Physical Activity: Inactive (01/04/2024)   Exercise Vital Sign    Days of Exercise per Week: 0 days    Minutes of Exercise per Session: 20 min  Stress: No Stress Concern Present (01/04/2024)   [employer] of Occupational Health - Occupational Stress Questionnaire    Feeling of Stress : Not at all  Social Connections: Moderately Integrated (01/04/2024)   Social Connection and Isolation Panel [NHANES]    Frequency of Communication with Friends and Family: Twice a week    Frequency of Social Gatherings with Friends and Family: Once a week    Attends Religious Services: Never    Database administrator or Organizations: Yes    Attends Engineer, structural: More than 4 times per year    Marital Status: Married  Catering manager Violence: Not At Risk (04/08/2023)   Humiliation, Afraid, Rape, and Kick questionnaire    Fear of Current or Ex-Partner: No    Emotionally Abused: No    Physically Abused: No    Sexually Abused: No    FAMILY HISTORY: Family History  Problem Relation Age of Onset   Cancer Father    Heart disease Brother    Dementia Brother    Leukemia Brother    Breast cancer Neg Hx    Colon cancer Neg Hx    Esophageal cancer Neg Hx    Pancreatic cancer Neg Hx    Rectal cancer Neg Hx    Stomach cancer Neg Hx     ALLERGIES:  is allergic to poison ivy extract, plavix [clopidogrel bisulfate], and other.  MEDICATIONS:  Current Outpatient Medications   Medication Sig Dispense Refill   traMADol (ULTRAM) 50 MG tablet Take 1 tablet (50 mg total) by mouth every 6 (six) hours as needed. 30 tablet 0   acyclovir (ZOVIRAX) 400 MG tablet Take 1 tablet (400 mg total) by mouth 2 (two) times daily. 180 tablet 2   amLODipine (NORVASC) 5 MG tablet Take 1 tablet (5 mg total) by mouth daily. 90 tablet 1   aspirin 81 MG tablet Take 81 mg by mouth daily.      atorvastatin (LIPITOR) 80 MG tablet TAKE 1 TABLET(80 MG) BY MOUTH DAILY AT 6 PM 90 tablet 1   benazepril (LOTENSIN) 10 MG tablet TAKE 1 TABLET(10 MG) BY MOUTH DAILY 90 tablet 1   Blood Glucose Monitoring Suppl DEVI 1 each by  Does not apply route as needed (symptoms of low blood sugar.). May substitute to any manufacturer covered by patient's insurance. 1 each 0   carfilzomib (KYPROLIS) 10 MG SOLR Inject 70 mg into the vein once a week.     cholecalciferol (VITAMIN D3) 25 MCG (1000 UNIT) tablet Take 2,000 Units by mouth daily.     Continuous Glucose Sensor (FREESTYLE LIBRE 3 SENSOR) MISC 1 Device by Does not apply route continuous. Place 1 sensor on the skin every 14 days. Use to check glucose continuously 2 each 11   dexamethasone (DECADRON) 4 MG tablet Take 10 tablets (40 mg total) by mouth once a week. 30 tablet 5   famotidine (PEPCID) 20 MG tablet Take 20 mg by mouth daily.     gabapentin (NEURONTIN) 100 MG capsule Take 1 capsule (100 mg total) by mouth 2 (two) times daily. 180 capsule 1   levothyroxine (SYNTHROID) 25 MCG tablet TAKE 1 TABLET(25 MCG) BY MOUTH DAILY BEFORE BREAKFAST 90 tablet 3   lidocaine-prilocaine (EMLA) cream Apply 1 Application topically as needed. 30 g 0   Magnesium 100 MG TABS Take 100 mg by mouth daily.     nitroGLYCERIN (NITROSTAT) 0.4 MG SL tablet Place 1 tablet (0.4 mg total) under the tongue every 5 (five) minutes as needed for chest pain. 25 tablet 1   polyethylene glycol (MIRALAX / GLYCOLAX) 17 g packet Take 17 g by mouth daily as needed.     No current  facility-administered medications for this visit.    REVIEW OF SYSTEMS:   Constitutional: ( - ) fevers, ( - )  chills , ( - ) night sweats Eyes: ( - ) blurriness of vision, ( - ) double vision, ( - ) watery eyes Ears, nose, mouth, throat, and face: ( - ) mucositis, ( - ) sore throat Respiratory: ( - ) cough, ( - ) dyspnea, ( - ) wheezes Cardiovascular: ( - ) palpitation, ( - ) chest discomfort, ( - ) lower extremity swelling Gastrointestinal:  ( - ) nausea, ( - ) heartburn, ( - ) change in bowel habits Skin: ( - ) abnormal skin rashes Lymphatics: ( - ) new lymphadenopathy, ( - ) easy bruising Neurological: ( +) numbness, ( - ) tingling, ( - ) new weaknesses Behavioral/Psych: ( - ) mood change, ( - ) new changes  All other systems were reviewed with the patient and are negative.  PHYSICAL EXAMINATION: ECOG PERFORMANCE STATUS: 1 - Symptomatic but completely ambulatory  Vitals:   02/12/24 1020  BP: 115/71  Pulse: (!) 101  Resp: 16  Temp: 98.8 F (37.1 C)  SpO2: 100%         Filed Weights   02/12/24 1020  Weight: 139 lb 11.2 oz (63.4 kg)       GENERAL: well appearing elderly Caucasian female in NAD  SKIN: skin color, texture, turgor are normal, no rashes or significant lesions EYES: conjunctiva are pink and non-injected, sclera clear LUNGS: clear to auscultation and percussion with normal breathing effort HEART: regular rate & rhythm and no murmurs and trace lower extremity edema Musculoskeletal: no cyanosis of digits and no clubbing  PSYCH: alert & oriented x 3, fluent speech NEURO: no focal motor/sensory deficits  LABORATORY DATA:  I have reviewed the data as listed    Latest Ref Rng & Units 02/12/2024    9:56 AM 02/05/2024    1:51 PM 01/29/2024    9:56 AM  CBC  WBC 4.0 - 10.5 K/uL 7.7  4.2  6.2   Hemoglobin 12.0 - 15.0 g/dL 40.9  81.1  91.4   Hematocrit 36.0 - 46.0 % 36.0  37.1  36.1   Platelets 150 - 400 K/uL 167  184  397        Latest Ref Rng & Units  02/12/2024    9:56 AM 02/05/2024    1:51 PM 01/29/2024    9:56 AM  CMP  Glucose 70 - 99 mg/dL 96  782  956   BUN 8 - 23 mg/dL 21  18  24    Creatinine 0.44 - 1.00 mg/dL 2.13  0.86  5.78   Sodium 135 - 145 mmol/L 139  139  139   Potassium 3.5 - 5.1 mmol/L 4.2  4.0  4.0   Chloride 98 - 111 mmol/L 107  109  107   CO2 22 - 32 mmol/L 27  25  26    Calcium 8.9 - 10.3 mg/dL 9.8  9.8  9.5   Total Protein 6.5 - 8.1 g/dL 6.1  6.6  6.2   Total Bilirubin 0.0 - 1.2 mg/dL 0.9  0.8  0.9   Alkaline Phos 38 - 126 U/L 42  54  53   AST 15 - 41 U/L 18  18  20    ALT 0 - 44 U/L 18  20  16      Lab Results  Component Value Date   MPROTEIN Not Observed 02/12/2024   MPROTEIN Not Observed 01/29/2024   MPROTEIN Not Observed 01/15/2024   Lab Results  Component Value Date   KPAFRELGTCHN 0.8 (L) 02/12/2024   KPAFRELGTCHN 3.7 01/29/2024   KPAFRELGTCHN 3.9 01/15/2024   LAMBDASER 120.4 (H) 02/12/2024   LAMBDASER 95.6 (H) 01/29/2024   LAMBDASER 58.8 (H) 01/15/2024   KAPLAMBRATIO 0.01 (L) 02/12/2024   KAPLAMBRATIO 0.04 (L) 01/29/2024   KAPLAMBRATIO 0.07 (L) 01/15/2024    RADIOGRAPHIC STUDIES: No results found.   ASSESSMENT & PLAN Doris Wilkerson is a 78 y.o. female with medical history significant for lambda light chain multiple myeloma who presents for a follow up visit. Ms. Frakes is not a candidate for bone marrow transplant based on her advanced age.    # Lambda Light Chain Multiple Myeloma--VGPR on maintenance --diagnosis confirmed with bone marrow biopsy and urine protein analysis.   --patient has good functional status and would be an excellent candidate for VRd chemotherapy. I do not believe she would be a bone marrow transplant candidate based on her age.  --Received VRD chemotherapy from 02/01/2021-07/12/2021 then started maintenance Revlimid on 07/19/2021.  --Due to rising lambda light chain, recommended to switch to Daratumumab/Pomalyst/Dexamethasone. --Received Dara/Pom/Dex from  03/27/2023-08/28/2023. --Due to rising lambda light chain, recommend to switch to Kyprolis/Dex 3 weeks on 1 week off, starting today. Plan: --Due to start Cycle 6, Day 15 of [place]/Dex today --Labs today show white blood cell count 7.7, hemoglobin 11.8, MCV 97.3, platelets 167. Creatinine and LFTs are within normal limits. Myeloma labs pending today.  --Plan to obtain myeloma labs every cycle.  --Proceed with treatment today without any dose modifications.  --Return to clinic weekly for labs/treatment and follow up visit in 2 weeks  #Hypoglycemia/Hyperglycemia --Glucose was 96  today.  Notified patient that her blood sugars were high.  She reports that she ate Carnation instant breakfast this morning and thinks that may have caused her blood sugars to spike.  She that she will monitor her blood sugars more closely. --Patient is asymptomatic today. Encouraged to eat right before treatment and snacks  every 2-3 hours.  --Patient will check her glucose levels on non-treatment days and follow up with PCP if non-fasting glucose is consistently 70 or below.  #Back Pain/Left Hip Pain  -- Currently well controlled on gabapentin and Tylenol --CT thoracic and lumbar spine showed no lytic lesions or sequelae of multiple myeloma --MRI thoracic and lumbar spine from 07/07/2022 for baseline imaging. Findings showed multiple bone lesions involving posterior T9 and T8, lumbar spine and upper sacrum consistent with multiple myeloma. No extension into the canal. No pathologic fracture.  --continue follow up with Dr .Donalee Citrin, neurosugery --Continue to monitor  #Supportive Care --chemotherapy education complete  --port placement complete on 09/09/2023. EMLA cream sent today.  --zofran 8mg  q8H PRN and compazine 10mg  PO q6H for nausea -- acyclovir 400mg  PO BID for VCZ prophylaxis --2 year of zometa therapy completed in July 2024.  -- no prescription pain medication required at this time.   No orders of the  defined types were placed in this encounter.  All questions were answered. The patient knows to call the clinic with any problems, questions or concerns.  I have spent a total of 30 minutes minutes of face-to-face and non-face-to-face time, preparing to see the patient, performing a medically appropriate examination, counseling and educating the patient, documenting clinical information in the electronic health record.   Ulysees Barns, MD Department of Hematology/Oncology [facility] Phone: [phone] Pager: [phone] Email: [email or web address]  02/16/2024 5:48 PM

## 2024-02-12 ENCOUNTER — Inpatient Hospital Stay: Payer: Medicare Other | Admitting: Hematology and Oncology

## 2024-02-12 ENCOUNTER — Inpatient Hospital Stay: Payer: Medicare Other

## 2024-02-12 VITALS — BP 115/71 | HR 101 | Temp 98.8°F | Resp 16 | Wt 139.7 lb

## 2024-02-12 VITALS — HR 100

## 2024-02-12 DIAGNOSIS — C9000 Multiple myeloma not having achieved remission: Secondary | ICD-10-CM

## 2024-02-12 DIAGNOSIS — Z95828 Presence of other vascular implants and grafts: Secondary | ICD-10-CM

## 2024-02-12 DIAGNOSIS — Z7961 Long term (current) use of immunomodulator: Secondary | ICD-10-CM | POA: Diagnosis not present

## 2024-02-12 DIAGNOSIS — Z7982 Long term (current) use of aspirin: Secondary | ICD-10-CM | POA: Diagnosis not present

## 2024-02-12 DIAGNOSIS — Z5112 Encounter for antineoplastic immunotherapy: Secondary | ICD-10-CM | POA: Diagnosis not present

## 2024-02-12 DIAGNOSIS — M899 Disorder of bone, unspecified: Secondary | ICD-10-CM

## 2024-02-12 DIAGNOSIS — Z79624 Long term (current) use of inhibitors of nucleotide synthesis: Secondary | ICD-10-CM | POA: Diagnosis not present

## 2024-02-12 DIAGNOSIS — Z79899 Other long term (current) drug therapy: Secondary | ICD-10-CM | POA: Diagnosis not present

## 2024-02-12 LAB — CBC WITH DIFFERENTIAL (CANCER CENTER ONLY)
Abs Immature Granulocytes: 0.02 10*3/uL (ref 0.00–0.07)
Basophils Absolute: 0 10*3/uL (ref 0.0–0.1)
Basophils Relative: 0 %
Eosinophils Absolute: 0.1 10*3/uL (ref 0.0–0.5)
Eosinophils Relative: 1 %
HCT: 36 % (ref 36.0–46.0)
Hemoglobin: 11.8 g/dL — ABNORMAL LOW (ref 12.0–15.0)
Immature Granulocytes: 0 %
Lymphocytes Relative: 4 %
Lymphs Abs: 0.3 10*3/uL — ABNORMAL LOW (ref 0.7–4.0)
MCH: 31.9 pg (ref 26.0–34.0)
MCHC: 32.8 g/dL (ref 30.0–36.0)
MCV: 97.3 fL (ref 80.0–100.0)
Monocytes Absolute: 0.3 10*3/uL (ref 0.1–1.0)
Monocytes Relative: 4 %
Neutro Abs: 7 10*3/uL (ref 1.7–7.7)
Neutrophils Relative %: 91 %
Platelet Count: 167 10*3/uL (ref 150–400)
RBC: 3.7 MIL/uL — ABNORMAL LOW (ref 3.87–5.11)
RDW: 13 % (ref 11.5–15.5)
WBC Count: 7.7 10*3/uL (ref 4.0–10.5)
nRBC: 0 % (ref 0.0–0.2)

## 2024-02-12 LAB — CMP (CANCER CENTER ONLY)
ALT: 18 U/L (ref 0–44)
AST: 18 U/L (ref 15–41)
Albumin: 4.2 g/dL (ref 3.5–5.0)
Alkaline Phosphatase: 42 U/L (ref 38–126)
Anion gap: 5 (ref 5–15)
BUN: 21 mg/dL (ref 8–23)
CO2: 27 mmol/L (ref 22–32)
Calcium: 9.8 mg/dL (ref 8.9–10.3)
Chloride: 107 mmol/L (ref 98–111)
Creatinine: 0.76 mg/dL (ref 0.44–1.00)
GFR, Estimated: >60 mL/min (ref >60)
Glucose, Bld: 96 mg/dL (ref 70–99)
Potassium: 4.2 mmol/L (ref 3.5–5.1)
Sodium: 139 mmol/L (ref 135–145)
Total Bilirubin: 0.9 mg/dL (ref 0.0–1.2)
Total Protein: 6.1 g/dL — ABNORMAL LOW (ref 6.5–8.1)

## 2024-02-12 MED ORDER — TRAMADOL HCL 50 MG PO TABS: 50.0000 mg | ORAL_TABLET | Freq: Four times a day (QID) | ORAL | 0 refills | Status: DC | PRN

## 2024-02-12 MED ORDER — SODIUM CHLORIDE 0.9 % IV SOLN: INTRAVENOUS | Status: DC

## 2024-02-12 MED ORDER — SODIUM CHLORIDE 0.9 % IV SOLN: Freq: Once | INTRAVENOUS | Status: AC

## 2024-02-12 MED ORDER — DEXTROSE 5 % IV SOLN
70.0000 mg/m2 | Freq: Once | INTRAVENOUS | Status: AC
  Administered 2024-02-12: 120 mg via INTRAVENOUS
  Filled 2024-02-12: qty 60

## 2024-02-12 MED ORDER — HEPARIN SOD (PORK) LOCK FLUSH 100 UNIT/ML IV SOLN
500.0000 [IU] | Freq: Once | INTRAVENOUS | Status: AC | PRN
  Administered 2024-02-12: 500 [IU]

## 2024-02-12 MED ORDER — SODIUM CHLORIDE 0.9% FLUSH
10.0000 mL | INTRAVENOUS | Status: DC | PRN
  Administered 2024-02-12: 10 mL

## 2024-02-12 MED ORDER — SODIUM CHLORIDE 0.9% FLUSH
10.0000 mL | Freq: Once | INTRAVENOUS | Status: AC
Start: 2024-02-12 — End: 2024-02-12
  Administered 2024-02-12: 10 mL

## 2024-02-12 NOTE — Patient Instructions (Signed)

## 2024-02-12 NOTE — Progress Notes (Signed)
Pt took dexamethasone at home this morning.

## 2024-02-15 ENCOUNTER — Encounter: Payer: Self-pay | Admitting: Physician Assistant

## 2024-02-15 LAB — MULTIPLE MYELOMA PANEL, SERUM
Albumin SerPl Elph-Mcnc: 3.6 g/dL (ref 2.9–4.4)
Albumin/Glob SerPl: 1.9 — ABNORMAL HIGH (ref 0.7–1.7)
Alpha 1: 0.2 g/dL (ref 0.0–0.4)
Alpha2 Glob SerPl Elph-Mcnc: 0.6 g/dL (ref 0.4–1.0)
B-Globulin SerPl Elph-Mcnc: 0.9 g/dL (ref 0.7–1.3)
Gamma Glob SerPl Elph-Mcnc: 0.2 g/dL — ABNORMAL LOW (ref 0.4–1.8)
Globulin, Total: 1.9 g/dL — ABNORMAL LOW (ref 2.2–3.9)
IgA: 18 mg/dL — ABNORMAL LOW (ref 64–422)
IgG (Immunoglobin G), Serum: 240 mg/dL — ABNORMAL LOW (ref 586–1602)
IgM (Immunoglobulin M), Srm: 5 mg/dL — ABNORMAL LOW (ref 26–217)
Total Protein ELP: 5.5 g/dL — ABNORMAL LOW (ref 6.0–8.5)

## 2024-02-15 LAB — KAPPA/LAMBDA LIGHT CHAINS
Kappa free light chain: 0.8 mg/L — ABNORMAL LOW (ref 3.3–19.4)
Kappa, lambda light chain ratio: 0.01 — ABNORMAL LOW (ref 0.26–1.65)
Lambda free light chains: 120.4 mg/L — ABNORMAL HIGH (ref 5.7–26.3)

## 2024-02-16 ENCOUNTER — Encounter: Payer: Self-pay | Admitting: Hematology and Oncology

## 2024-02-16 MED ORDER — DEXAMETHASONE 4 MG PO TABS: 40.0000 mg | ORAL_TABLET | ORAL | 5 refills | Status: DC

## 2024-02-17 ENCOUNTER — Telehealth: Payer: Self-pay | Admitting: *Deleted

## 2024-02-17 NOTE — Telephone Encounter (Signed)
 TCT patient regarding recent lab results. Spoke with her. Advised that her Lambda chains continue to rise, currently at 120. We will start the process of having her referred to Dr. Westley Foots at [facility]. In the mean time we will keep her on her current kyprolis treatment.  Advised that I would be faxing the referral to Dr. Rob Bunting office today. Doris Wilkerson voiced understanding.

## 2024-02-17 NOTE — Telephone Encounter (Signed)
 TCT patient regarding recent lab results.

## 2024-02-17 NOTE — Telephone Encounter (Signed)
-----   Message from Ulysees Barns IV sent at 02/16/2024  3:12 PM EDT ----- Please let Doris Wilkerson know that her Lambda chains continue to rise, currently at 120. We will start the process of having her referred to Dr. Westley Foots at [facility]. In the mean time we will keep her on her current kyprolis treatment ----- Message ----- From: Leory Plowman, Lab In [place] Sent: 02/12/2024  10:16 AM EDT To: Jaci Standard, MD

## 2024-02-23 ENCOUNTER — Ambulatory Visit: Payer: Medicare Other | Admitting: Cardiology

## 2024-02-23 DIAGNOSIS — C9002 Multiple myeloma in relapse: Secondary | ICD-10-CM | POA: Diagnosis not present

## 2024-02-23 DIAGNOSIS — C9000 Multiple myeloma not having achieved remission: Secondary | ICD-10-CM | POA: Diagnosis not present

## 2024-02-24 ENCOUNTER — Encounter: Payer: Self-pay | Admitting: Hematology and Oncology

## 2024-02-25 ENCOUNTER — Telehealth (INDEPENDENT_AMBULATORY_CARE_PROVIDER_SITE_OTHER): Admitting: "Endocrinology

## 2024-02-25 ENCOUNTER — Encounter: Payer: Self-pay | Admitting: "Endocrinology

## 2024-02-25 DIAGNOSIS — Z7952 Long term (current) use of systemic steroids: Secondary | ICD-10-CM

## 2024-02-25 DIAGNOSIS — Z8639 Personal history of other endocrine, nutritional and metabolic disease: Secondary | ICD-10-CM | POA: Diagnosis not present

## 2024-02-25 NOTE — Progress Notes (Signed)
 The patient reports they are currently: [name]. I spent 8-9 minutes on the video with the patient on the date of service. I spent an additional 5 minutes on pre- and post-visit activities on the date of service.   The patient was physically located in [place] or a state in which I am permitted to provide care. The patient and/or parent/guardian understood that s/he may incur co-pays and cost sharing, and agreed to the telemedicine visit. The visit was reasonable and appropriate under the circumstances given the patient's presentation at the time.  The patient and/or parent/guardian has been advised of the potential risks and limitations of this mode of treatment (including, but not limited to, the absence of in-person examination) and has agreed to be treated using telemedicine. The patient's/patient's family's questions regarding telemedicine have been answered.   The patient and/or parent/guardian has also been advised to contact their provider's office for worsening conditions, and seek emergency medical treatment and/or call 911 if the patient deems either necessary.    Outpatient Endocrinology Note Doris [place], MD    Doris Wilkerson 05-07-1946 [id number]  Referring Provider: Shade Flood, MD Primary Care Provider: Shade Flood, MD Reason for consultation: Subjective   Assessment & Plan  Diagnoses and all orders for this visit:  History of hypoglycemia   Patient is on multiple myeloma treatment, has relapsing disease  Patient wore CGM (no date available) reports she had 2 episodes of low blood sugar, lowest being 53 (was on dexamethasone 20 mg weekly) Patient is now on dexamethasone 40 mg weekly as part of multiple myeloma treatment  No hypoglycemia reported at this point Been on dexamethasone for treatment in 2022 and and in 2024 4.7 cortisol day before weekly steroid, 11/04/24 ACTH stimulation test done day prior to dexamethasone  A1C 5.8 at one point, now  at 4.6, keep a watch for low BG day 3-7 post dexamethasone Patient has BG meter and supplies Ordered libre 3 previously    No follow-ups on file.   Patient will make follow-up as needed, especially if her dexamethasone dose changes or she develops hypoglycemia   I have reviewed current medications, nurse's notes, allergies, vital signs, past medical and surgical history, family medical history, and social history for this encounter. Counseled patient on symptoms, examination findings, lab findings, imaging results, treatment decisions and monitoring and prognosis. The patient understood the recommendations and agrees with the treatment plan. All questions regarding treatment plan were fully answered.  Doris [place], MD  02/25/24   History of Present Illness HPI  Doris Wilkerson is a 78 y.o. year old female who presents for evaluation of hypoglycemia.  Diagnosed of multiple myeloma in 2022  No more episodes of low blood sugars She is currently on 40 mg of dexamethasone weekly as part of treatment and plan is to stay on it indefinitely  Day of steroid gets high energy, day 3 energy comes down but patient doesn't feel need of anything to supplement her energy day prior of steroid         Component Ref Range & Units (hover) 3 mo ago (11/05/23) 3 mo ago (11/05/23)  TIME 1 256 256  Specimen 6.5   Time 2 332 332   SPECIMEN 2 29.0   TIME 3 404 404  SPECIMEN 3 31.4   RAM       Initial history:  Patient is on phase 3 of multiple myeloma treatment which is not curable per patient  She was having low BG (  lowest 53) during phase 2 of her treatment Patient was weak and wobbly on walking when she had low BG (no symptoms sitting down)   Physical Exam  There were no vitals taken for this visit.   Constitutional: well developed, well nourished Head: normocephalic, atraumatic Eyes: sclera anicteric, no redness Neck: supple Lungs: normal respiratory effort Neurology: alert and  oriented Skin: dry, no appreciable rashes Musculoskeletal: no appreciable defects Psychiatric: normal mood and affect   Current Medications Patient's Medications  New Prescriptions   No medications on file  Previous Medications   ACYCLOVIR (ZOVIRAX) 400 MG TABLET    Take 1 tablet (400 mg total) by mouth 2 (two) times daily.   AMLODIPINE (NORVASC) 5 MG TABLET    Take 1 tablet (5 mg total) by mouth daily.   ASPIRIN 81 MG TABLET    Take 81 mg by mouth daily.    ATORVASTATIN (LIPITOR) 80 MG TABLET    TAKE 1 TABLET(80 MG) BY MOUTH DAILY AT 6 PM   BENAZEPRIL (LOTENSIN) 10 MG TABLET    TAKE 1 TABLET(10 MG) BY MOUTH DAILY   BLOOD GLUCOSE MONITORING SUPPL DEVI    1 each by Does not apply route as needed (symptoms of low blood sugar.). May substitute to any manufacturer covered by patient's insurance.   CARFILZOMIB (KYPROLIS) 10 MG SOLR    Inject 70 mg into the vein once a week.   CHOLECALCIFEROL (VITAMIN D3) 25 MCG (1000 UNIT) TABLET    Take 2,000 Units by mouth daily.   CONTINUOUS GLUCOSE SENSOR (FREESTYLE LIBRE 3 SENSOR) MISC    1 Device by Does not apply route continuous. Place 1 sensor on the skin every 14 days. Use to check glucose continuously   DEXAMETHASONE (DECADRON) 4 MG TABLET    Take 10 tablets (40 mg total) by mouth once a week.   FAMOTIDINE (PEPCID) 20 MG TABLET    Take 20 mg by mouth daily.   GABAPENTIN (NEURONTIN) 100 MG CAPSULE    Take 1 capsule (100 mg total) by mouth 2 (two) times daily.   LEVOTHYROXINE (SYNTHROID) 25 MCG TABLET    TAKE 1 TABLET(25 MCG) BY MOUTH DAILY BEFORE BREAKFAST   LIDOCAINE-PRILOCAINE (EMLA) CREAM    Apply 1 Application topically as needed.   MAGNESIUM 100 MG TABS    Take 100 mg by mouth daily.   NITROGLYCERIN (NITROSTAT) 0.4 MG SL TABLET    Place 1 tablet (0.4 mg total) under the tongue every 5 (five) minutes as needed for chest pain.   POLYETHYLENE GLYCOL (MIRALAX / GLYCOLAX) 17 G PACKET    Take 17 g by mouth daily as needed.   TRAMADOL (ULTRAM) 50 MG  TABLET    Take 1 tablet (50 mg total) by mouth every 6 (six) hours as needed.  Modified Medications   No medications on file  Discontinued Medications   No medications on file    Allergies Allergies  Allergen Reactions   Poison Ivy Extract Swelling   Plavix [Clopidogrel Bisulfate]    Other     seasonal    Past Medical History Past Medical History:  Diagnosis Date   Asthma    Cataract    GERD (gastroesophageal reflux disease)    Hyperlipidemia    Hypertension    Thyroid disease     Past Surgical History Past Surgical History:  Procedure Laterality Date   Cataract Surgery     CORONARY ANGIOPLASTY WITH STENT PLACEMENT     EYE SURGERY     IR  IMAGING GUIDED PORT INSERTION  09/09/2023   ORTHOPEDIC SURGERY  2012   TONSILLECTOMY  1951    Family History family history includes Cancer in her father; Dementia in her brother; Heart disease in her brother; Leukemia in her brother.  Social History Social History   Socioeconomic History   Marital status: Married    Spouse name: Not on file   Number of children: 0   Years of education: Not on file   Highest education level: Master's degree (e.g., MA, MS, MEng, MEd, MSW, MBA)  Occupational History   Occupation: unemployed  Tobacco Use   Smoking status: Never   Smokeless tobacco: Never  Vaping Use   Vaping status: Never Used  Substance and Sexual Activity   Alcohol use: Yes    Alcohol/week: 1.0 standard drink of alcohol    Types: 1 Glasses of wine per week    Comment: occassionally   Drug use: No   Sexual activity: Not Currently  Other Topics Concern   Not on file  Social History Narrative   Married. Education: college. Pt does exercise-   Social Drivers of Health   Financial Resource Strain: Low Risk  (02/22/2024)   Received from [facility]   Overall Financial Resource Strain (CARDIA)    Difficulty of Paying Living Expenses: Not very hard  Food Insecurity: No Food Insecurity (02/22/2024)   Received from  [facility]   Hunger Vital Sign    Worried About Running Out of Food in the Last Year: Never true    Ran Out of Food in the Last Year: Never true  Transportation Needs: No Transportation Needs (02/22/2024)   Received from [facility] - Transportation    Lack of Transportation (Medical): No    Lack of Transportation (Non-Medical): No  Physical Activity: Inactive (01/04/2024)   Exercise Vital Sign    Days of Exercise per Week: 0 days    Minutes of Exercise per Session: 20 min  Stress: No Stress Concern Present (01/04/2024)   [employer] of Occupational Health - Occupational Stress Questionnaire    Feeling of Stress : Not at all  Social Connections: Moderately Integrated (01/04/2024)   Social Connection and Isolation Panel [NHANES]    Frequency of Communication with Friends and Family: Twice a week    Frequency of Social Gatherings with Friends and Family: Once a week    Attends Religious Services: Never    Database administrator or Organizations: Yes    Attends Engineer, structural: More than 4 times per year    Marital Status: Married  Catering manager Violence: Not At Risk (04/08/2023)   Humiliation, Afraid, Rape, and Kick questionnaire    Fear of Current or Ex-Partner: No    Emotionally Abused: No    Physically Abused: No    Sexually Abused: No    Lab Results  Component Value Date   CHOL 175 01/07/2024   Lab Results  Component Value Date   HDL 89.20 01/07/2024   Lab Results  Component Value Date   LDLCALC 55 01/07/2024   Lab Results  Component Value Date   TRIG 154.0 (H) 01/07/2024   Lab Results  Component Value Date   CHOLHDL 2 01/07/2024   Lab Results  Component Value Date   CREATININE 0.76 02/12/2024   Lab Results  Component Value Date   GFR 70.49 01/08/2023      Component Value Date/Time   NA 139 02/12/2024 0956   NA 140  12/06/2020 1031   K 4.2 02/12/2024 0956   CL 107 02/12/2024 0956   CO2 27 02/12/2024 0956    GLUCOSE 96 02/12/2024 0956   BUN 21 02/12/2024 0956   BUN 14 12/06/2020 1031   CREATININE 0.76 02/12/2024 0956   CREATININE 0.71 08/25/2016 1048   CALCIUM 9.8 02/12/2024 0956   PROT 6.1 (L) 02/12/2024 0956   PROT 6.8 06/01/2020 1009   ALBUMIN 4.2 02/12/2024 0956   ALBUMIN 5.1 (H) 06/01/2020 1009   AST 18 02/12/2024 0956   ALT 18 02/12/2024 0956   ALKPHOS 42 02/12/2024 0956   BILITOT 0.9 02/12/2024 0956   GFRNONAA >60 02/12/2024 0956   GFRNONAA 87 08/25/2016 1048   GFRAA 85 12/06/2020 1031   GFRAA >89 08/25/2016 1048      Latest Ref Rng & Units 02/12/2024    9:56 AM 02/05/2024    1:51 PM 01/29/2024    9:56 AM  BMP  Glucose 70 - 99 mg/dL 96  308  657   BUN 8 - 23 mg/dL 21  18  24    Creatinine 0.44 - 1.00 mg/dL 8.46  9.62  9.52   Sodium 135 - 145 mmol/L 139  139  139   Potassium 3.5 - 5.1 mmol/L 4.2  4.0  4.0   Chloride 98 - 111 mmol/L 107  109  107   CO2 22 - 32 mmol/L 27  25  26    Calcium 8.9 - 10.3 mg/dL 9.8  9.8  9.5        Component Value Date/Time   WBC 7.7 02/12/2024 0956   WBC 5.7 01/21/2021 0739   RBC 3.70 (L) 02/12/2024 0956   HGB 11.8 (L) 02/12/2024 0956   HGB 12.0 12/06/2020 1031   HCT 36.0 02/12/2024 0956   HCT 36.2 12/06/2020 1031   PLT 167 02/12/2024 0956   PLT 248 12/06/2020 1031   MCV 97.3 02/12/2024 0956   MCV 89 12/06/2020 1031   MCH 31.9 02/12/2024 0956   MCHC 32.8 02/12/2024 0956   RDW 13.0 02/12/2024 0956   RDW 13.2 12/06/2020 1031   LYMPHSABS 0.3 (L) 02/12/2024 0956   LYMPHSABS 1.8 09/04/2017 1536   MONOABS 0.3 02/12/2024 0956   EOSABS 0.1 02/12/2024 0956   EOSABS 0.2 09/04/2017 1536   BASOSABS 0.0 02/12/2024 0956   BASOSABS 0.1 09/04/2017 1536   Lab Results  Component Value Date   TSH 1.48 01/07/2024   TSH 1.63 07/09/2023   TSH 1.29 01/08/2023         Parts of this note may have been dictated using voice recognition software. There may be variances in spelling and vocabulary which are unintentional. Not all errors are proofread.  Please notify the Thereasa Parkin if any discrepancies are noted or if the meaning of any statement is not clear.

## 2024-02-26 ENCOUNTER — Inpatient Hospital Stay: Payer: Medicare Other

## 2024-02-26 ENCOUNTER — Inpatient Hospital Stay: Payer: Medicare Other | Attending: Physician Assistant

## 2024-02-26 ENCOUNTER — Other Ambulatory Visit: Payer: Self-pay

## 2024-02-26 ENCOUNTER — Ambulatory Visit: Payer: Medicare Other | Admitting: Hematology and Oncology

## 2024-02-26 ENCOUNTER — Other Ambulatory Visit: Payer: Medicare Other

## 2024-02-26 ENCOUNTER — Inpatient Hospital Stay (HOSPITAL_BASED_OUTPATIENT_CLINIC_OR_DEPARTMENT_OTHER): Payer: Medicare Other | Admitting: Physician Assistant

## 2024-02-26 ENCOUNTER — Inpatient Hospital Stay: Attending: Physician Assistant

## 2024-02-26 VITALS — BP 131/83 | HR 105 | Temp 98.7°F | Resp 16 | Wt 139.2 lb

## 2024-02-26 DIAGNOSIS — Z79899 Other long term (current) drug therapy: Secondary | ICD-10-CM | POA: Diagnosis not present

## 2024-02-26 DIAGNOSIS — Z5112 Encounter for antineoplastic immunotherapy: Secondary | ICD-10-CM | POA: Insufficient documentation

## 2024-02-26 DIAGNOSIS — C9000 Multiple myeloma not having achieved remission: Secondary | ICD-10-CM

## 2024-02-26 DIAGNOSIS — Z95828 Presence of other vascular implants and grafts: Secondary | ICD-10-CM

## 2024-02-26 DIAGNOSIS — Z7961 Long term (current) use of immunomodulator: Secondary | ICD-10-CM | POA: Insufficient documentation

## 2024-02-26 DIAGNOSIS — Z7982 Long term (current) use of aspirin: Secondary | ICD-10-CM | POA: Diagnosis not present

## 2024-02-26 DIAGNOSIS — N390 Urinary tract infection, site not specified: Secondary | ICD-10-CM | POA: Diagnosis not present

## 2024-02-26 DIAGNOSIS — Z79624 Long term (current) use of inhibitors of nucleotide synthesis: Secondary | ICD-10-CM | POA: Diagnosis not present

## 2024-02-26 DIAGNOSIS — R300 Dysuria: Secondary | ICD-10-CM | POA: Diagnosis not present

## 2024-02-26 LAB — URINALYSIS, COMPLETE (UACMP) WITH MICROSCOPIC
Bilirubin Urine: NEGATIVE
Glucose, UA: NEGATIVE mg/dL
Hgb urine dipstick: NEGATIVE
Ketones, ur: NEGATIVE mg/dL
Nitrite: NEGATIVE
Protein, ur: NEGATIVE mg/dL
Specific Gravity, Urine: 1.013 (ref 1.005–1.030)
pH: 5 (ref 5.0–8.0)

## 2024-02-26 LAB — CMP (CANCER CENTER ONLY)
ALT: 20 U/L (ref 0–44)
AST: 22 U/L (ref 15–41)
Albumin: 4.6 g/dL (ref 3.5–5.0)
Alkaline Phosphatase: 56 U/L (ref 38–126)
Anion gap: 6 (ref 5–15)
BUN: 17 mg/dL (ref 8–23)
CO2: 24 mmol/L (ref 22–32)
Calcium: 9.4 mg/dL (ref 8.9–10.3)
Chloride: 108 mmol/L (ref 98–111)
Creatinine: 1.09 mg/dL — ABNORMAL HIGH (ref 0.44–1.00)
GFR, Estimated: 52 mL/min — ABNORMAL LOW (ref >60)
Glucose, Bld: 139 mg/dL — ABNORMAL HIGH (ref 70–99)
Potassium: 4 mmol/L (ref 3.5–5.1)
Sodium: 138 mmol/L (ref 135–145)
Total Bilirubin: 0.7 mg/dL (ref 0.0–1.2)
Total Protein: 6.6 g/dL (ref 6.5–8.1)

## 2024-02-26 LAB — CBC WITH DIFFERENTIAL (CANCER CENTER ONLY)
Abs Immature Granulocytes: 0.02 10*3/uL (ref 0.00–0.07)
Basophils Absolute: 0.1 10*3/uL (ref 0.0–0.1)
Basophils Relative: 1 %
Eosinophils Absolute: 0 10*3/uL (ref 0.0–0.5)
Eosinophils Relative: 0 %
HCT: 35.6 % — ABNORMAL LOW (ref 36.0–46.0)
Hemoglobin: 11.5 g/dL — ABNORMAL LOW (ref 12.0–15.0)
Immature Granulocytes: 0 %
Lymphocytes Relative: 3 %
Lymphs Abs: 0.2 10*3/uL — ABNORMAL LOW (ref 0.7–4.0)
MCH: 31.9 pg (ref 26.0–34.0)
MCHC: 32.3 g/dL (ref 30.0–36.0)
MCV: 98.6 fL (ref 80.0–100.0)
Monocytes Absolute: 0.1 10*3/uL (ref 0.1–1.0)
Monocytes Relative: 1 %
Neutro Abs: 7.5 10*3/uL (ref 1.7–7.7)
Neutrophils Relative %: 95 %
Platelet Count: 361 10*3/uL (ref 150–400)
RBC: 3.61 MIL/uL — ABNORMAL LOW (ref 3.87–5.11)
RDW: 12.8 % (ref 11.5–15.5)
WBC Count: 7.9 10*3/uL (ref 4.0–10.5)
nRBC: 0 % (ref 0.0–0.2)

## 2024-02-26 MED ORDER — SODIUM CHLORIDE 0.9 % IV SOLN: INTRAVENOUS | Status: DC

## 2024-02-26 MED ORDER — SODIUM CHLORIDE 0.9% FLUSH
10.0000 mL | INTRAVENOUS | Status: DC | PRN
  Administered 2024-02-26: 10 mL

## 2024-02-26 MED ORDER — SODIUM CHLORIDE 0.9% FLUSH
10.0000 mL | Freq: Once | INTRAVENOUS | Status: AC
  Administered 2024-02-26: 10 mL

## 2024-02-26 MED ORDER — DEXTROSE 5 % IV SOLN
70.0000 mg/m2 | Freq: Once | INTRAVENOUS | Status: AC
  Administered 2024-02-26: 120 mg via INTRAVENOUS
  Filled 2024-02-26: qty 60

## 2024-02-26 MED ORDER — HEPARIN SOD (PORK) LOCK FLUSH 100 UNIT/ML IV SOLN
500.0000 [IU] | Freq: Once | INTRAVENOUS | Status: AC | PRN
  Administered 2024-02-26: 500 [IU]

## 2024-02-26 MED ORDER — SODIUM CHLORIDE 0.9 % IV SOLN: Freq: Once | INTRAVENOUS | Status: AC

## 2024-02-26 NOTE — Patient Instructions (Signed)

## 2024-02-27 ENCOUNTER — Encounter: Payer: Self-pay | Admitting: Physician Assistant

## 2024-02-27 MED ORDER — CIPROFLOXACIN HCL 250 MG PO TABS: 250.0000 mg | ORAL_TABLET | Freq: Two times a day (BID) | ORAL | 0 refills | Status: AC

## 2024-02-27 NOTE — Progress Notes (Unsigned)
 [facility] [phone]   [phone]  PROGRESS NOTE  Patient Care Team: Shade Flood, MD as PCP - General (Family Medicine) Yates Decamp, MD as PCP - Cardiology (Cardiology) Yates Decamp, MD as Consulting Physician (Cardiology) Marzella Schlein., MD (Ophthalmology)  Hematological/Oncological History # Doris Wilkerson Chain Multiple Myeloma 12/26/2020: MRI of pelvis showed lytic lesions on L4, L5, the sacrum, with pathologic fracture of left inferior pubic ramus. Concerning for multiple myeloma vs metastatic disease 12/31/2020: establish care with Dr. Leonides Schanz. UPEP showed marked Bence Jones protein, findings concerning for multiple myleoma 01/21/2021: bone marrow biopsy confirms multiple myeloma with atypical plasma cells representing 28% of all cells in the aspirate  02/01/2021: Cycle 1 Day 1 of VRd chemotherapy 02/22/2021: Cycle 2 Day 1 of VRd chemotherapy 03/15/2021: Cycle 3 Day 1 of VRd chemotherapy 04/05/2021: Cycle 4 Day 1 of VRd chemotherapy 04/26/2021: Cycle 5 Day 1 of VRd chemotherapy 05/17/2021: Cycle 6 Day 1 of VRd chemotherapy 06/07/2021:  Cycle 7 Day 1 of VRd chemotherapy 06/28/2021: Cycle 8 Day 1 of VRd chemotherapy 07/19/2021: start maintenance revlimid 10mg  PO daily.  03/26/2022: Cycle 1 Day 1 of Dara/Pom/Dex 04/24/2023: Cycle 2 Day 1 of Dara/Pom/Dex 05/22/2023: Cycle 3 Day 1 of Dara/Pom/Dex 06/19/2023: Cycle 4 Day 1 of Dara/Pom/Dex 07/17/2023: Cycle 5 Day 1 of Dara/Pom/Dex 08/28/2023: d/c Dara/Pom/Dex due to progression.  09/11/2023: Cycle 1 Day 1 of Kyprolis/Dex 10/08/2023: Cycle 2 Day 1 of Kyprolis/Dex 11/06/2023: Cycle 3 Day 1 of Kyprolis/Dex 12/04/2023: Cycle 4 Day 1 of Kyprolis/Dex 01/01/2024: Cycle 5 Day 1 of Kyprolis/Dex 01/29/2024: Cycle 6 Day 1 of Kyprolis/Dex 02/26/2024: Cycle 7 Day 1 of Kyprolis/Dex Interval History:  Doris Wilkerson 78 y.o. female with medical history significant for lambda light chain multiple myeloma who presents for a follow up  visit. She was last seen on 02/12/2024. In the interim has continued Kyprolis/Dex today. She is due for Cycle 7, Day 1 today.   On exam today Doris Wilkerson is unaccompanied for this visit.  Doris Wilkerson reports that she is doing well without any changes to her health.  She was evaluated by Dr. Delynn Flavin from [facility] to discussed her next line of therapy.  She is finalizing her decision to move forward with either CAR-T versus BiTE therapy.  She reports her energy levels are fairly stable and she continues to complete all her ADLs on her own.  She denies any nausea, vomiting or bowel habit changes.  She has noticed increased urinary frequency but no dysuria, hematuria or foul-smelling odor.  She is tolerating Kyprolis well without any major toxicities.  She has no other complaints. A full 10 point ROS is listed below.    MEDICAL HISTORY:  Past Medical History:  Diagnosis Date   Asthma    Cataract    GERD (gastroesophageal reflux disease)    Hyperlipidemia    Hypertension    Thyroid disease     SURGICAL HISTORY: Past Surgical History:  Procedure Laterality Date   Cataract Surgery     CORONARY ANGIOPLASTY WITH STENT PLACEMENT     EYE SURGERY     IR IMAGING GUIDED PORT INSERTION  09/09/2023   ORTHOPEDIC SURGERY  2012   TONSILLECTOMY  1951    SOCIAL HISTORY: Social History   Socioeconomic History   Marital status: Married    Spouse name: Not on file   Number of children: 0   Years of education: Not on file   Highest education level: Master's degree (e.g., MA, MS,  MEng, MEd, MSW, MBA)  Occupational History   Occupation: unemployed  Tobacco Use   Smoking status: Never   Smokeless tobacco: Never  Vaping Use   Vaping status: Never Used  Substance and Sexual Activity   Alcohol use: Yes    Alcohol/week: 1.0 standard drink of alcohol    Types: 1 Glasses of wine per week    Comment: occassionally   Drug use: No   Sexual activity: Not Currently  Other Topics Concern   Not on file  Social  History Narrative   Married. Education: college. Pt does exercise-   Social Drivers of Health   Financial Resource Strain: Low Risk  (02/22/2024)   Received from [facility]   Overall Financial Resource Strain (CARDIA)    Difficulty of Paying Living Expenses: Not very hard  Food Insecurity: No Food Insecurity (02/22/2024)   Received from [facility]   Hunger Vital Sign    Worried About Running Out of Food in the Last Year: Never true    Ran Out of Food in the Last Year: Never true  Transportation Needs: No Transportation Needs (02/22/2024)   Received from [facility] - Transportation    Lack of Transportation (Medical): No    Lack of Transportation (Non-Medical): No  Physical Activity: Inactive (01/04/2024)   Exercise Vital Sign    Days of Exercise per Week: 0 days    Minutes of Exercise per Session: 20 min  Stress: No Stress Concern Present (01/04/2024)   [employer] of Occupational Health - Occupational Stress Questionnaire    Feeling of Stress : Not at all  Social Connections: Moderately Integrated (01/04/2024)   Social Connection and Isolation Panel [NHANES]    Frequency of Communication with Friends and Family: Twice a week    Frequency of Social Gatherings with Friends and Family: Once a week    Attends Religious Services: Never    Database administrator or Organizations: Yes    Attends Engineer, structural: More than 4 times per year    Marital Status: Married  Catering manager Violence: Not At Risk (04/08/2023)   Humiliation, Afraid, Rape, and Kick questionnaire    Fear of Current or Ex-Partner: No    Emotionally Abused: No    Physically Abused: No    Sexually Abused: No    FAMILY HISTORY: Family History  Problem Relation Age of Onset   Cancer Father    Heart disease Brother    Dementia Brother    Leukemia Brother    Breast cancer Neg Hx    Colon cancer Neg Hx    Esophageal cancer Neg Hx    Pancreatic cancer Neg Hx    Rectal  cancer Neg Hx    Stomach cancer Neg Hx     ALLERGIES:  is allergic to poison ivy extract, plavix [clopidogrel bisulfate], and other.  MEDICATIONS:  Current Outpatient Medications  Medication Sig Dispense Refill   acyclovir (ZOVIRAX) 400 MG tablet Take 1 tablet (400 mg total) by mouth 2 (two) times daily. 180 tablet 2   aspirin 81 MG tablet Take 81 mg by mouth daily.      atorvastatin (LIPITOR) 80 MG tablet TAKE 1 TABLET(80 MG) BY MOUTH DAILY AT 6 PM 90 tablet 1   benazepril (LOTENSIN) 10 MG tablet TAKE 1 TABLET(10 MG) BY MOUTH DAILY 90 tablet 1   Blood Glucose Monitoring Suppl DEVI 1 each by Does not apply route as needed (symptoms of low blood sugar.).  May substitute to any manufacturer covered by patient's insurance. 1 each 0   carfilzomib (KYPROLIS) 10 MG SOLR Inject 70 mg into the vein once a week.     cholecalciferol (VITAMIN D3) 25 MCG (1000 UNIT) tablet Take 2,000 Units by mouth daily.     ciprofloxacin (CIPRO) 250 MG tablet Take 1 tablet (250 mg total) by mouth 2 (two) times daily for 5 days. 10 tablet 0   Continuous Glucose Sensor (FREESTYLE LIBRE 3 SENSOR) MISC 1 Device by Does not apply route continuous. Place 1 sensor on the skin every 14 days. Use to check glucose continuously 2 each 11   dexamethasone (DECADRON) 4 MG tablet Take 10 tablets (40 mg total) by mouth once a week. 30 tablet 5   famotidine (PEPCID) 20 MG tablet Take 20 mg by mouth daily.     gabapentin (NEURONTIN) 100 MG capsule Take 1 capsule (100 mg total) by mouth 2 (two) times daily. 180 capsule 1   levothyroxine (SYNTHROID) 25 MCG tablet TAKE 1 TABLET(25 MCG) BY MOUTH DAILY BEFORE BREAKFAST 90 tablet 3   lidocaine-prilocaine (EMLA) cream Apply 1 Application topically as needed. 30 g 0   Magnesium 100 MG TABS Take 100 mg by mouth daily.     polyethylene glycol (MIRALAX / GLYCOLAX) 17 g packet Take 17 g by mouth daily as needed.     traMADol (ULTRAM) 50 MG tablet Take 1 tablet (50 mg total) by mouth every 6 (six)  hours as needed. 30 tablet 0   amLODipine (NORVASC) 5 MG tablet Take 1 tablet (5 mg total) by mouth daily. 90 tablet 1   nitroGLYCERIN (NITROSTAT) 0.4 MG SL tablet Place 1 tablet (0.4 mg total) under the tongue every 5 (five) minutes as needed for chest pain. 25 tablet 1   No current facility-administered medications for this visit.    REVIEW OF SYSTEMS:   Constitutional: ( - ) fevers, ( - )  chills , ( - ) night sweats Eyes: ( - ) blurriness of vision, ( - ) double vision, ( - ) watery eyes Ears, nose, mouth, throat, and face: ( - ) mucositis, ( - ) sore throat Respiratory: ( - ) cough, ( - ) dyspnea, ( - ) wheezes Cardiovascular: ( - ) palpitation, ( - ) chest discomfort, ( - ) lower extremity swelling Gastrointestinal:  ( - ) nausea, ( - ) heartburn, ( - ) change in bowel habits Skin: ( - ) abnormal skin rashes Lymphatics: ( - ) new lymphadenopathy, ( - ) easy bruising Neurological: ( +) numbness, ( - ) tingling, ( - ) new weaknesses Behavioral/Psych: ( - ) mood change, ( - ) new changes  All other systems were reviewed with the patient and are negative.  PHYSICAL EXAMINATION: ECOG PERFORMANCE STATUS: 1 - Symptomatic but completely ambulatory  Vitals:   02/26/24 1326  BP: 131/83  Pulse: (!) 105  Resp: 16  Temp: 98.7 F (37.1 C)  SpO2: 97%    Filed Weights   02/26/24 1326  Weight: 139 lb 3.2 oz (63.1 kg)    GENERAL: well appearing elderly Caucasian female in NAD  SKIN: skin color, texture, turgor are normal, no rashes or significant lesions EYES: conjunctiva are pink and non-injected, sclera clear LUNGS: clear to auscultation and percussion with normal breathing effort HEART: regular rate & rhythm and no murmurs and trace lower extremity edema Musculoskeletal: no cyanosis of digits and no clubbing  PSYCH: alert & oriented x 3, fluent speech NEURO: no focal  motor/sensory deficits  LABORATORY DATA:  I have reviewed the data as listed    Latest Ref Rng & Units  02/26/2024   12:36 PM 02/12/2024    9:56 AM 02/05/2024    1:51 PM  CBC  WBC 4.0 - 10.5 K/uL 7.9  7.7  4.2   Hemoglobin 12.0 - 15.0 g/dL 40.9  81.1  91.4   Hematocrit 36.0 - 46.0 % 35.6  36.0  37.1   Platelets 150 - 400 K/uL 361  167  184        Latest Ref Rng & Units 02/26/2024   12:36 PM 02/12/2024    9:56 AM 02/05/2024    1:51 PM  CMP  Glucose 70 - 99 mg/dL 782  96  956   BUN 8 - 23 mg/dL 17  21  18    Creatinine 0.44 - 1.00 mg/dL 2.13  0.86  5.78   Sodium 135 - 145 mmol/L 138  139  139   Potassium 3.5 - 5.1 mmol/L 4.0  4.2  4.0   Chloride 98 - 111 mmol/L 108  107  109   CO2 22 - 32 mmol/L 24  27  25    Calcium 8.9 - 10.3 mg/dL 9.4  9.8  9.8   Total Protein 6.5 - 8.1 g/dL 6.6  6.1  6.6   Total Bilirubin 0.0 - 1.2 mg/dL 0.7  0.9  0.8   Alkaline Phos 38 - 126 U/L 56  42  54   AST 15 - 41 U/L 22  18  18    ALT 0 - 44 U/L 20  18  20      Lab Results  Component Value Date   MPROTEIN Not Observed 02/12/2024   MPROTEIN Not Observed 01/29/2024   MPROTEIN Not Observed 01/15/2024   Lab Results  Component Value Date   KPAFRELGTCHN 0.8 (L) 02/12/2024   KPAFRELGTCHN 3.7 01/29/2024   KPAFRELGTCHN 3.9 01/15/2024   LAMBDASER 120.4 (H) 02/12/2024   LAMBDASER 95.6 (H) 01/29/2024   LAMBDASER 58.8 (H) 01/15/2024   KAPLAMBRATIO 0.01 (L) 02/12/2024   KAPLAMBRATIO 0.04 (L) 01/29/2024   KAPLAMBRATIO 0.07 (L) 01/15/2024    RADIOGRAPHIC STUDIES: No results found.   ASSESSMENT & PLAN Doris Wilkerson is a 78 y.o. female with medical history significant for lambda light chain multiple myeloma who presents for a follow up visit. Doris Wilkerson is not a candidate for bone marrow transplant based on her advanced age.    # Lambda Light Chain Multiple Myeloma--VGPR on maintenance --diagnosis confirmed with bone marrow biopsy and urine protein analysis.   --patient has good functional status and would be an excellent candidate for VRd chemotherapy. I do not believe she would be a bone marrow transplant  candidate based on her age.  --Received VRD chemotherapy from 02/01/2021-07/12/2021 then started maintenance Revlimid on 07/19/2021.  --Due to rising lambda light chain, recommended to switch to Daratumumab/Pomalyst/Dexamethasone. --Received Dara/Pom/Dex from 03/27/2023-08/28/2023. --Due to rising lambda light chain, recommend to switch to Kyprolis/Dex 3 weeks on 1 week off, starting today. Plan: --Due to start Cycle 7, Day 1 of [place]/Dex today --Labs today show white blood cell count 7.9, hemoglobin 11.5, MCV 98.6, platelets 361. Creatinine slighly above normal at 1.09. LFTs normal. Myeloma labs pending.  --Plan to obtain myeloma labs every cycle.  --Patient met with Dr. Delynn Flavin from [facility] on 02/23/2024 and is planning to make a decision between CAR-T versus BiTE. She will notify us once she makes a decision. I did share that if she decides  to proceed with BiTE, we can arrange the maintenance treatments here once she completes step-up dosing.  --Proceed with treatment today without any dose modifications.  --Return to clinic weekly for labs/treatment and follow up visit in 2 weeks  #Urinary urgency: --UA plus culture confirmed UTI with gram negative rods. Awaiting susceptibility results.  --Sent ciprofloxacin 250 mg BID x 5 days.  --Encouraged PO hydration. --Will repeat UA/culture next week on 4/11  #Back Pain/Left Hip Pain  -- Currently well controlled on gabapentin and Tylenol --CT thoracic and lumbar spine showed no lytic lesions or sequelae of multiple myeloma --MRI thoracic and lumbar spine from 07/07/2022 for baseline imaging. Findings showed multiple bone lesions involving posterior T9 and T8, lumbar spine and upper sacrum consistent with multiple myeloma. No extension into the canal. No pathologic fracture.  --continue follow up with Dr .Donalee Citrin, neurosugery --Continue to monitor  #Supportive Care --chemotherapy education complete  --port placement complete on 09/09/2023. EMLA  cream sent today.  --zofran 8mg  q8H PRN and compazine 10mg  PO q6H for nausea -- acyclovir 400mg  PO BID for VCZ prophylaxis --2 year of zometa therapy completed in July 2024.  -- no prescription pain medication required at this time.   Orders Placed This Encounter  Procedures   Urine Culture    Standing Status:   Future    Number of Occurrences:   1    Expiration Date:   02/25/2025   Urinalysis, Complete w Microscopic    Standing Status:   Future    Number of Occurrences:   1    Expiration Date:   02/25/2025   All questions were answered. The patient knows to call the clinic with any problems, questions or concerns.  I have spent a total of 30 minutes minutes of face-to-face and non-face-to-face time, preparing to see the patient, performing a medically appropriate examination, counseling and educating the patient, documenting clinical information in the electronic health record.   Doris Kaufmann PA-C Dept of Hematology and Oncology [facility] Phone: [phone]   02/27/2024 4:39 PM

## 2024-02-28 LAB — URINE CULTURE: Culture: 80000 — AB

## 2024-02-29 ENCOUNTER — Encounter: Payer: Self-pay | Admitting: Physician Assistant

## 2024-02-29 ENCOUNTER — Encounter: Payer: Self-pay | Admitting: Hematology and Oncology

## 2024-02-29 LAB — KAPPA/LAMBDA LIGHT CHAINS
Kappa free light chain: 4.5 mg/L (ref 3.3–19.4)
Kappa, lambda light chain ratio: 0.04 — ABNORMAL LOW (ref 0.26–1.65)
Lambda free light chains: 113.3 mg/L — ABNORMAL HIGH (ref 5.7–26.3)

## 2024-03-01 LAB — MULTIPLE MYELOMA PANEL, SERUM
Albumin SerPl Elph-Mcnc: 4.1 g/dL (ref 2.9–4.4)
Albumin/Glob SerPl: 2 — ABNORMAL HIGH (ref 0.7–1.7)
Alpha 1: 0.2 g/dL (ref 0.0–0.4)
Alpha2 Glob SerPl Elph-Mcnc: 0.7 g/dL (ref 0.4–1.0)
B-Globulin SerPl Elph-Mcnc: 1 g/dL (ref 0.7–1.3)
Gamma Glob SerPl Elph-Mcnc: 0.2 g/dL — ABNORMAL LOW (ref 0.4–1.8)
Globulin, Total: 2.1 g/dL — ABNORMAL LOW (ref 2.2–3.9)
IgA: 21 mg/dL — ABNORMAL LOW (ref 64–422)
IgG (Immunoglobin G), Serum: 255 mg/dL — ABNORMAL LOW (ref 586–1602)
IgM (Immunoglobulin M), Srm: <5 mg/dL — ABNORMAL LOW (ref 26–217)
Total Protein ELP: 6.2 g/dL (ref 6.0–8.5)

## 2024-03-04 ENCOUNTER — Inpatient Hospital Stay

## 2024-03-04 ENCOUNTER — Ambulatory Visit: Payer: Medicare Other

## 2024-03-04 ENCOUNTER — Other Ambulatory Visit: Payer: Medicare Other

## 2024-03-04 VITALS — BP 149/80 | HR 95 | Temp 97.9°F | Resp 16 | Wt 139.8 lb

## 2024-03-04 DIAGNOSIS — C9000 Multiple myeloma not having achieved remission: Secondary | ICD-10-CM

## 2024-03-04 DIAGNOSIS — R300 Dysuria: Secondary | ICD-10-CM

## 2024-03-04 DIAGNOSIS — Z95828 Presence of other vascular implants and grafts: Secondary | ICD-10-CM

## 2024-03-04 DIAGNOSIS — Z7982 Long term (current) use of aspirin: Secondary | ICD-10-CM | POA: Diagnosis not present

## 2024-03-04 DIAGNOSIS — N390 Urinary tract infection, site not specified: Secondary | ICD-10-CM | POA: Diagnosis not present

## 2024-03-04 DIAGNOSIS — Z79624 Long term (current) use of inhibitors of nucleotide synthesis: Secondary | ICD-10-CM | POA: Diagnosis not present

## 2024-03-04 DIAGNOSIS — Z7961 Long term (current) use of immunomodulator: Secondary | ICD-10-CM | POA: Diagnosis not present

## 2024-03-04 DIAGNOSIS — Z5112 Encounter for antineoplastic immunotherapy: Secondary | ICD-10-CM | POA: Diagnosis not present

## 2024-03-04 LAB — URINALYSIS, COMPLETE (UACMP) WITH MICROSCOPIC
Bacteria, UA: NONE SEEN
Bilirubin Urine: NEGATIVE
Glucose, UA: NEGATIVE mg/dL
Hgb urine dipstick: NEGATIVE
Ketones, ur: NEGATIVE mg/dL
Leukocytes,Ua: NEGATIVE
Nitrite: NEGATIVE
Protein, ur: NEGATIVE mg/dL
Specific Gravity, Urine: 1.01 (ref 1.005–1.030)
pH: 5 (ref 5.0–8.0)

## 2024-03-04 LAB — CMP (CANCER CENTER ONLY)
ALT: 19 U/L (ref 0–44)
AST: 19 U/L (ref 15–41)
Albumin: 4.5 g/dL (ref 3.5–5.0)
Alkaline Phosphatase: 53 U/L (ref 38–126)
Anion gap: 6 (ref 5–15)
BUN: 18 mg/dL (ref 8–23)
CO2: 25 mmol/L (ref 22–32)
Calcium: 10 mg/dL (ref 8.9–10.3)
Chloride: 107 mmol/L (ref 98–111)
Creatinine: 0.66 mg/dL (ref 0.44–1.00)
GFR, Estimated: >60 mL/min (ref >60)
Glucose, Bld: 134 mg/dL — ABNORMAL HIGH (ref 70–99)
Potassium: 3.9 mmol/L (ref 3.5–5.1)
Sodium: 138 mmol/L (ref 135–145)
Total Bilirubin: 0.8 mg/dL (ref 0.0–1.2)
Total Protein: 6.5 g/dL (ref 6.5–8.1)

## 2024-03-04 LAB — CBC WITH DIFFERENTIAL (CANCER CENTER ONLY)
Abs Immature Granulocytes: 0.03 10*3/uL (ref 0.00–0.07)
Basophils Absolute: 0 10*3/uL (ref 0.0–0.1)
Basophils Relative: 1 %
Eosinophils Absolute: 0 10*3/uL (ref 0.0–0.5)
Eosinophils Relative: 0 %
HCT: 35.2 % — ABNORMAL LOW (ref 36.0–46.0)
Hemoglobin: 11.9 g/dL — ABNORMAL LOW (ref 12.0–15.0)
Immature Granulocytes: 1 %
Lymphocytes Relative: 5 %
Lymphs Abs: 0.2 10*3/uL — ABNORMAL LOW (ref 0.7–4.0)
MCH: 31.9 pg (ref 26.0–34.0)
MCHC: 33.8 g/dL (ref 30.0–36.0)
MCV: 94.4 fL (ref 80.0–100.0)
Monocytes Absolute: 0.1 10*3/uL (ref 0.1–1.0)
Monocytes Relative: 2 %
Neutro Abs: 4.9 10*3/uL (ref 1.7–7.7)
Neutrophils Relative %: 91 %
Platelet Count: 187 10*3/uL (ref 150–400)
RBC: 3.73 MIL/uL — ABNORMAL LOW (ref 3.87–5.11)
RDW: 12.9 % (ref 11.5–15.5)
WBC Count: 5.4 10*3/uL (ref 4.0–10.5)
nRBC: 0 % (ref 0.0–0.2)

## 2024-03-04 MED ORDER — SODIUM CHLORIDE 0.9 % IV SOLN: Freq: Once | INTRAVENOUS | Status: AC

## 2024-03-04 MED ORDER — HEPARIN SOD (PORK) LOCK FLUSH 100 UNIT/ML IV SOLN
500.0000 [IU] | Freq: Once | INTRAVENOUS | Status: AC | PRN
  Administered 2024-03-04: 500 [IU]

## 2024-03-04 MED ORDER — SODIUM CHLORIDE 0.9% FLUSH
10.0000 mL | INTRAVENOUS | Status: DC | PRN
  Administered 2024-03-04: 10 mL

## 2024-03-04 MED ORDER — DEXTROSE 5 % IV SOLN
70.0000 mg/m2 | Freq: Once | INTRAVENOUS | Status: AC
  Administered 2024-03-04: 120 mg via INTRAVENOUS
  Filled 2024-03-04: qty 60

## 2024-03-04 MED ORDER — SODIUM CHLORIDE 0.9% FLUSH
10.0000 mL | Freq: Once | INTRAVENOUS | Status: AC
Start: 2024-03-04 — End: 2024-03-04
  Administered 2024-03-04: 10 mL

## 2024-03-04 MED ORDER — SODIUM CHLORIDE 0.9 % IV SOLN: INTRAVENOUS | Status: DC

## 2024-03-04 NOTE — Progress Notes (Signed)
 Patient took her steroid at home today.

## 2024-03-05 LAB — URINE CULTURE: Culture: NO GROWTH

## 2024-03-07 ENCOUNTER — Encounter: Payer: Self-pay | Admitting: Physician Assistant

## 2024-03-09 ENCOUNTER — Telehealth: Payer: Self-pay | Admitting: *Deleted

## 2024-03-09 ENCOUNTER — Other Ambulatory Visit: Payer: Self-pay | Admitting: Physician Assistant

## 2024-03-09 ENCOUNTER — Other Ambulatory Visit: Payer: Self-pay | Admitting: *Deleted

## 2024-03-09 DIAGNOSIS — Z08 Encounter for follow-up examination after completed treatment for malignant neoplasm: Secondary | ICD-10-CM

## 2024-03-09 DIAGNOSIS — Z79890 Hormone replacement therapy (postmenopausal): Secondary | ICD-10-CM | POA: Diagnosis not present

## 2024-03-09 DIAGNOSIS — C9000 Multiple myeloma not having achieved remission: Secondary | ICD-10-CM

## 2024-03-09 DIAGNOSIS — E7800 Pure hypercholesterolemia, unspecified: Secondary | ICD-10-CM | POA: Diagnosis not present

## 2024-03-09 DIAGNOSIS — E039 Hypothyroidism, unspecified: Secondary | ICD-10-CM | POA: Diagnosis not present

## 2024-03-09 DIAGNOSIS — Z79899 Other long term (current) drug therapy: Secondary | ICD-10-CM | POA: Diagnosis not present

## 2024-03-09 DIAGNOSIS — I10 Essential (primary) hypertension: Secondary | ICD-10-CM | POA: Diagnosis not present

## 2024-03-09 DIAGNOSIS — I2510 Atherosclerotic heart disease of native coronary artery without angina pectoris: Secondary | ICD-10-CM | POA: Diagnosis not present

## 2024-03-09 DIAGNOSIS — G629 Polyneuropathy, unspecified: Secondary | ICD-10-CM | POA: Diagnosis not present

## 2024-03-09 NOTE — Telephone Encounter (Signed)
 TCT patient regarding up coming appts. We have received a call from Dr. Nadia Aurora office @ [facility] hematology office today. They are requesting a cardiac echo and an EKG prior to her cell collection for Car-T therapy. This cell collection  will be scheduled in the next 1-2 weeks and needed these cardiac studies done as soon as possible. Left detailed message for pt on her identified phone vm: Echocardiogram is scheduled on this Friday @ 7:30 am Pt is to be here around 8am for labs etc. Advised that it will be ok for her to be late for that as she may end up not having treatment on this Friday due to the need for a "2 week washout period" prior to her cell collection. Advised that we will be in touch with her about that soon-when we hear from [facility] about it. Advised to call back with questions/concerns @ [phone]

## 2024-03-10 ENCOUNTER — Ambulatory Visit (HOSPITAL_COMMUNITY): Attending: Physician Assistant

## 2024-03-10 ENCOUNTER — Telehealth: Payer: Self-pay | Admitting: *Deleted

## 2024-03-10 DIAGNOSIS — Z0189 Encounter for other specified special examinations: Secondary | ICD-10-CM | POA: Diagnosis not present

## 2024-03-10 DIAGNOSIS — Z08 Encounter for follow-up examination after completed treatment for malignant neoplasm: Secondary | ICD-10-CM | POA: Diagnosis not present

## 2024-03-10 DIAGNOSIS — C9000 Multiple myeloma not having achieved remission: Secondary | ICD-10-CM | POA: Diagnosis not present

## 2024-03-10 LAB — ECHOCARDIOGRAM COMPLETE
AR max vel: 2.46 cm2
AV Area VTI: 2.36 cm2
AV Area mean vel: 2.37 cm2
AV Mean grad: 3.5 mmHg
AV Peak grad: 6.4 mmHg
Ao pk vel: 1.27 m/s
Calc EF: 53.4 %
S' Lateral: 3.27 cm
Single Plane A2C EF: 51.3 %
Single Plane A4C EF: 55 %

## 2024-03-10 NOTE — Telephone Encounter (Signed)
 TCT patient to confirm the change in her cardiac echo-set for this afternoon and her appts tomorrow. Spoke with her. She is aware of her appts for tomorrow. She also understands that we have not confirmed whether or not sh will get her Kyprolis treatment or not, until we hear from Dr. Terrence Ferron office regarding the date for her T cell collection.

## 2024-03-11 ENCOUNTER — Inpatient Hospital Stay: Payer: Medicare Other

## 2024-03-11 ENCOUNTER — Other Ambulatory Visit: Payer: Self-pay

## 2024-03-11 ENCOUNTER — Ambulatory Visit (HOSPITAL_COMMUNITY)

## 2024-03-11 ENCOUNTER — Inpatient Hospital Stay (HOSPITAL_BASED_OUTPATIENT_CLINIC_OR_DEPARTMENT_OTHER): Payer: Medicare Other | Admitting: Hematology and Oncology

## 2024-03-11 ENCOUNTER — Inpatient Hospital Stay

## 2024-03-11 VITALS — BP 133/83 | HR 83 | Temp 99.2°F | Resp 16 | Wt 140.2 lb

## 2024-03-11 DIAGNOSIS — Z79624 Long term (current) use of inhibitors of nucleotide synthesis: Secondary | ICD-10-CM | POA: Diagnosis not present

## 2024-03-11 DIAGNOSIS — C9000 Multiple myeloma not having achieved remission: Secondary | ICD-10-CM

## 2024-03-11 DIAGNOSIS — Z7982 Long term (current) use of aspirin: Secondary | ICD-10-CM | POA: Diagnosis not present

## 2024-03-11 DIAGNOSIS — Z7961 Long term (current) use of immunomodulator: Secondary | ICD-10-CM | POA: Diagnosis not present

## 2024-03-11 DIAGNOSIS — Z95828 Presence of other vascular implants and grafts: Secondary | ICD-10-CM

## 2024-03-11 DIAGNOSIS — N390 Urinary tract infection, site not specified: Secondary | ICD-10-CM | POA: Diagnosis not present

## 2024-03-11 DIAGNOSIS — Z5112 Encounter for antineoplastic immunotherapy: Secondary | ICD-10-CM | POA: Diagnosis not present

## 2024-03-11 LAB — CBC WITH DIFFERENTIAL (CANCER CENTER ONLY)
Abs Immature Granulocytes: 0.02 10*3/uL (ref 0.00–0.07)
Basophils Absolute: 0 10*3/uL (ref 0.0–0.1)
Basophils Relative: 0 %
Eosinophils Absolute: 0.2 10*3/uL (ref 0.0–0.5)
Eosinophils Relative: 4 %
HCT: 32.3 % — ABNORMAL LOW (ref 36.0–46.0)
Hemoglobin: 10.8 g/dL — ABNORMAL LOW (ref 12.0–15.0)
Immature Granulocytes: 0 %
Lymphocytes Relative: 7 %
Lymphs Abs: 0.3 10*3/uL — ABNORMAL LOW (ref 0.7–4.0)
MCH: 31.8 pg (ref 26.0–34.0)
MCHC: 33.4 g/dL (ref 30.0–36.0)
MCV: 95 fL (ref 80.0–100.0)
Monocytes Absolute: 0.4 10*3/uL (ref 0.1–1.0)
Monocytes Relative: 9 %
Neutro Abs: 3.8 10*3/uL (ref 1.7–7.7)
Neutrophils Relative %: 80 %
Platelet Count: 164 10*3/uL (ref 150–400)
RBC: 3.4 MIL/uL — ABNORMAL LOW (ref 3.87–5.11)
RDW: 13.2 % (ref 11.5–15.5)
WBC Count: 4.7 10*3/uL (ref 4.0–10.5)
nRBC: 0 % (ref 0.0–0.2)

## 2024-03-11 LAB — CMP (CANCER CENTER ONLY)
ALT: 19 U/L (ref 0–44)
AST: 19 U/L (ref 15–41)
Albumin: 4.1 g/dL (ref 3.5–5.0)
Alkaline Phosphatase: 50 U/L (ref 38–126)
Anion gap: 5 (ref 5–15)
BUN: 17 mg/dL (ref 8–23)
CO2: 25 mmol/L (ref 22–32)
Calcium: 9.2 mg/dL (ref 8.9–10.3)
Chloride: 109 mmol/L (ref 98–111)
Creatinine: 0.63 mg/dL (ref 0.44–1.00)
GFR, Estimated: >60 mL/min (ref >60)
Glucose, Bld: 83 mg/dL (ref 70–99)
Potassium: 4.1 mmol/L (ref 3.5–5.1)
Sodium: 139 mmol/L (ref 135–145)
Total Bilirubin: 0.9 mg/dL (ref 0.0–1.2)
Total Protein: 5.8 g/dL — ABNORMAL LOW (ref 6.5–8.1)

## 2024-03-11 MED ORDER — SODIUM CHLORIDE 0.9% FLUSH
10.0000 mL | INTRAVENOUS | Status: DC | PRN
Start: 2024-03-11 — End: 2024-03-11
  Administered 2024-03-11: 10 mL

## 2024-03-11 MED ORDER — SODIUM CHLORIDE 0.9 % IV SOLN: INTRAVENOUS | Status: DC

## 2024-03-11 MED ORDER — DEXTROSE 5 % IV SOLN
70.0000 mg/m2 | Freq: Once | INTRAVENOUS | Status: AC
  Administered 2024-03-11: 120 mg via INTRAVENOUS
  Filled 2024-03-11: qty 60

## 2024-03-11 MED ORDER — HEPARIN SOD (PORK) LOCK FLUSH 100 UNIT/ML IV SOLN
500.0000 [IU] | Freq: Once | INTRAVENOUS | Status: AC | PRN
  Administered 2024-03-11: 500 [IU]

## 2024-03-11 MED ORDER — SODIUM CHLORIDE 0.9 % IV SOLN: Freq: Once | INTRAVENOUS | Status: AC

## 2024-03-11 MED ORDER — SODIUM CHLORIDE 0.9% FLUSH
10.0000 mL | Freq: Once | INTRAVENOUS | Status: AC
  Administered 2024-03-11: 10 mL

## 2024-03-11 NOTE — Progress Notes (Unsigned)
 [facility] [phone]   [phone]  PROGRESS NOTE  Patient Care Team: Benjiman Bras, MD as PCP - General (Family Medicine) Knox Perl, MD as PCP - Cardiology (Cardiology) Knox Perl, MD as Consulting Physician (Cardiology) Thurmon Florida., MD (Ophthalmology)  Hematological/Oncological History # Doris Wilkerson Multiple Myeloma 12/26/2020: MRI of pelvis showed lytic lesions on L4, L5, the sacrum, with pathologic fracture of left inferior pubic ramus. Concerning for multiple myeloma vs metastatic disease 12/31/2020: establish care with Dr. Rosaline Coma. UPEP showed marked Bence Jones protein, findings concerning for multiple myleoma 01/21/2021: bone marrow biopsy confirms multiple myeloma with atypical plasma cells representing 28% of all cells in the aspirate  02/01/2021: Cycle 1 Day 1 of VRd chemotherapy 02/22/2021: Cycle 2 Day 1 of VRd chemotherapy 03/15/2021: Cycle 3 Day 1 of VRd chemotherapy 04/05/2021: Cycle 4 Day 1 of VRd chemotherapy 04/26/2021: Cycle 5 Day 1 of VRd chemotherapy 05/17/2021: Cycle 6 Day 1 of VRd chemotherapy 06/07/2021:  Cycle 7 Day 1 of VRd chemotherapy 06/28/2021: Cycle 8 Day 1 of VRd chemotherapy 07/19/2021: start maintenance revlimid  10mg  PO daily.  03/26/2022: Cycle 1 Day 1 of Dara/Pom/Dex 04/24/2023: Cycle 2 Day 1 of Dara/Pom/Dex 05/22/2023: Cycle 3 Day 1 of Dara/Pom/Dex 06/19/2023: Cycle 4 Day 1 of Dara/Pom/Dex 07/17/2023: Cycle 5 Day 1 of Dara/Pom/Dex 08/28/2023: d/c Dara/Pom/Dex due to progression.  09/11/2023: Cycle 1 Day 1 of Kyprolis /Dex 10/08/2023: Cycle 2 Day 1 of Kyprolis /Dex 11/06/2023: Cycle 3 Day 1 of Kyprolis /Dex 12/04/2023: Cycle 4 Day 1 of Kyprolis /Dex 01/01/2024: Cycle 5 Day 1 of Kyprolis /Dex 01/29/2024: Cycle 6 Day 1 of Kyprolis /Dex 02/26/2024: Cycle 7 Day 1 of Kyprolis /Dex  Interval History:  Doris Wilkerson 78 y.o. female with medical history significant for lambda light Wilkerson multiple myeloma who presents for a follow  up visit. She was last seen on 02/26/2024. In the interim has continued Kyprolis /Dex today. She is due for Cycle 7, Day 15 today.   On exam today Doris Wilkerson is accompanied by her husband.  She reports that she has been tolerating Kyprolis  well with no major side effects.  Is not causing any neuropathy, numbness or tingling of the fingers or toes.  She reports her appetite is good and she is not having any nausea, vomiting, or diarrhea.  She notes that her energy levels are strong and she has not had any trouble with fevers, chills, sweats.  A full 10 point ROS is otherwise negative.  The bulk of our discussion focused on the steps moving forward with Carvykti therapy to be administered at [facility].  She is following with Dr. Gillis Ladd.  We are happy to assist in support in any way and continue monitoring once the patient has completed therapy.  Doris Wilkerson voiced understanding of our recommendations moving forward.   MEDICAL HISTORY:  Past Medical History:  Diagnosis Date   Asthma    Cataract    GERD (gastroesophageal reflux disease)    Hyperlipidemia    Hypertension    Thyroid  disease     SURGICAL HISTORY: Past Surgical History:  Procedure Laterality Date   Cataract Surgery     CORONARY ANGIOPLASTY WITH STENT PLACEMENT     EYE SURGERY     IR IMAGING GUIDED PORT INSERTION  09/09/2023   ORTHOPEDIC SURGERY  2012   TONSILLECTOMY  1951    SOCIAL HISTORY: Social History   Socioeconomic History   Marital status: Married    Spouse name: Not on file   Number of children: 0  Years of education: Not on file   Highest education level: Master's degree (e.g., MA, MS, MEng, MEd, MSW, MBA)  Occupational History   Occupation: unemployed  Tobacco Use   Smoking status: Never   Smokeless tobacco: Never  Vaping Use   Vaping status: Never Used  Substance and Sexual Activity   Alcohol use: Yes    Alcohol/week: 1.0 standard drink of alcohol    Types: 1 Glasses of wine per week    Comment:  occassionally   Drug use: No   Sexual activity: Not Currently  Other Topics Concern   Not on file  Social History Narrative   Married. Education: college. Pt does exercise-   Social Drivers of Health   Financial Resource Strain: Low Risk  (02/22/2024)   Received from [facility]   Overall Financial Resource Strain (CARDIA)    Difficulty of Paying Living Expenses: Not very hard  Food Insecurity: No Food Insecurity (02/22/2024)   Received from [facility]   Hunger Vital Sign    Worried About Running Out of Food in the Last Year: Never true    Ran Out of Food in the Last Year: Never true  Transportation Needs: No Transportation Needs (02/22/2024)   Received from [facility] - Transportation    Lack of Transportation (Medical): No    Lack of Transportation (Non-Medical): No  Physical Activity: Inactive (01/04/2024)   Exercise Vital Sign    Days of Exercise per Week: 0 days    Minutes of Exercise per Session: 20 min  Stress: No Stress Concern Present (01/04/2024)   [employer] of Occupational Health - Occupational Stress Questionnaire    Feeling of Stress : Not at all  Social Connections: Moderately Integrated (01/04/2024)   Social Connection and Isolation Panel [NHANES]    Frequency of Communication with Friends and Family: Twice a week    Frequency of Social Gatherings with Friends and Family: Once a week    Attends Religious Services: Never    Database administrator or Organizations: Yes    Attends Engineer, structural: More than 4 times per year    Marital Status: Married  Catering manager Violence: Not At Risk (04/08/2023)   Humiliation, Afraid, Rape, and Kick questionnaire    Fear of Current or Ex-Partner: No    Emotionally Abused: No    Physically Abused: No    Sexually Abused: No    FAMILY HISTORY: Family History  Problem Relation Age of Onset   Cancer Father    Heart disease Brother    Dementia Brother    Leukemia Brother     Breast cancer Neg Hx    Colon cancer Neg Hx    Esophageal cancer Neg Hx    Pancreatic cancer Neg Hx    Rectal cancer Neg Hx    Stomach cancer Neg Hx     ALLERGIES:  is allergic to poison ivy extract, plavix [clopidogrel bisulfate], and other.  MEDICATIONS:  Current Outpatient Medications  Medication Sig Dispense Refill   acyclovir  (ZOVIRAX ) 400 MG tablet Take 1 tablet (400 mg total) by mouth 2 (two) times daily. 180 tablet 2   amLODipine  (NORVASC ) 5 MG tablet Take 1 tablet (5 mg total) by mouth daily. 90 tablet 1   aspirin 81 MG tablet Take 81 mg by mouth daily.      atorvastatin  (LIPITOR) 80 MG tablet TAKE 1 TABLET(80 MG) BY MOUTH DAILY AT 6 PM 90 tablet 1   benazepril  (  LOTENSIN ) 10 MG tablet TAKE 1 TABLET(10 MG) BY MOUTH DAILY 90 tablet 1   Blood Glucose Monitoring Suppl DEVI 1 each by Does not apply route as needed (symptoms of low blood sugar.). May substitute to any manufacturer covered by patient's insurance. 1 each 0   carfilzomib  (KYPROLIS ) 10 MG SOLR Inject 70 mg into the vein once a week.     cholecalciferol (VITAMIN D3) 25 MCG (1000 UNIT) tablet Take 2,000 Units by mouth daily.     Continuous Glucose Sensor (FREESTYLE LIBRE 3 SENSOR) MISC 1 Device by Does not apply route continuous. Place 1 sensor on the skin every 14 days. Use to check glucose continuously 2 each 11   dexamethasone  (DECADRON ) 4 MG tablet Take 10 tablets (40 mg total) by mouth once a week. 30 tablet 5   famotidine (PEPCID) 20 MG tablet Take 20 mg by mouth daily.     gabapentin  (NEURONTIN ) 100 MG capsule Take 1 capsule (100 mg total) by mouth 2 (two) times daily. 180 capsule 1   levothyroxine  (SYNTHROID ) 25 MCG tablet TAKE 1 TABLET(25 MCG) BY MOUTH DAILY BEFORE BREAKFAST 90 tablet 3   lidocaine -prilocaine  (EMLA ) cream Apply 1 Application topically as needed. 30 g 0   Magnesium 100 MG TABS Take 100 mg by mouth daily.     nitroGLYCERIN  (NITROSTAT ) 0.4 MG SL tablet Place 1 tablet (0.4 mg total) under the tongue  every 5 (five) minutes as needed for chest pain. 25 tablet 1   polyethylene glycol (MIRALAX / GLYCOLAX) 17 g packet Take 17 g by mouth daily as needed.     traMADol  (ULTRAM ) 50 MG tablet Take 1 tablet (50 mg total) by mouth every 6 (six) hours as needed. 30 tablet 0   No current facility-administered medications for this visit.    REVIEW OF SYSTEMS:   Constitutional: ( - ) fevers, ( - )  chills , ( - ) night sweats Eyes: ( - ) blurriness of vision, ( - ) double vision, ( - ) watery eyes Ears, nose, mouth, throat, and face: ( - ) mucositis, ( - ) sore throat Respiratory: ( - ) cough, ( - ) dyspnea, ( - ) wheezes Cardiovascular: ( - ) palpitation, ( - ) chest discomfort, ( - ) lower extremity swelling Gastrointestinal:  ( - ) nausea, ( - ) heartburn, ( - ) change in bowel habits Skin: ( - ) abnormal skin rashes Lymphatics: ( - ) new lymphadenopathy, ( - ) easy bruising Neurological: ( +) numbness, ( - ) tingling, ( - ) new weaknesses Behavioral/Psych: ( - ) mood change, ( - ) new changes  All other systems were reviewed with the patient and are negative.  PHYSICAL EXAMINATION: ECOG PERFORMANCE STATUS: 1 - Symptomatic but completely ambulatory  Vitals:   03/11/24 0845  BP: 133/83  Pulse: 83  Resp: 16  Temp: 99.2 F (37.3 C)  SpO2: 100%     Filed Weights   03/11/24 0845  Weight: 140 lb 3.2 oz (63.6 kg)     GENERAL: well appearing elderly Caucasian female in NAD  SKIN: skin color, texture, turgor are normal, no rashes or significant lesions EYES: conjunctiva are pink and non-injected, sclera clear LUNGS: clear to auscultation and percussion with normal breathing effort HEART: regular rate & rhythm and no murmurs and trace lower extremity edema Musculoskeletal: no cyanosis of digits and no clubbing  PSYCH: alert & oriented x 3, fluent speech NEURO: no focal motor/sensory deficits  LABORATORY DATA:  I have  reviewed the data as listed    Latest Ref Rng & Units 03/11/2024     8:05 AM 03/04/2024   11:33 AM 02/26/2024   12:36 PM  CBC  WBC 4.0 - 10.5 K/uL 4.7  5.4  7.9   Hemoglobin 12.0 - 15.0 g/dL 09.8  11.9  14.7   Hematocrit 36.0 - 46.0 % 32.3  35.2  35.6   Platelets 150 - 400 K/uL 164  187  361        Latest Ref Rng & Units 03/11/2024    8:05 AM 03/04/2024   11:33 AM 02/26/2024   12:36 PM  CMP  Glucose 70 - 99 mg/dL 83  829  562   BUN 8 - 23 mg/dL 17  18  17    Creatinine 0.44 - 1.00 mg/dL 1.30  8.65  7.84   Sodium 135 - 145 mmol/L 139  138  138   Potassium 3.5 - 5.1 mmol/L 4.1  3.9  4.0   Chloride 98 - 111 mmol/L 109  107  108   CO2 22 - 32 mmol/L 25  25  24    Calcium  8.9 - 10.3 mg/dL 9.2  69.6  9.4   Total Protein 6.5 - 8.1 g/dL 5.8  6.5  6.6   Total Bilirubin 0.0 - 1.2 mg/dL 0.9  0.8  0.7   Alkaline Phos 38 - 126 U/L 50  53  56   AST 15 - 41 U/L 19  19  22    ALT 0 - 44 U/L 19  19  20      Lab Results  Component Value Date   MPROTEIN Not Observed 02/26/2024   MPROTEIN Not Observed 02/12/2024   MPROTEIN Not Observed 01/29/2024   Lab Results  Component Value Date   KPAFRELGTCHN 4.5 02/26/2024   KPAFRELGTCHN 0.8 (L) 02/12/2024   KPAFRELGTCHN 3.7 01/29/2024   LAMBDASER 113.3 (H) 02/26/2024   LAMBDASER 120.4 (H) 02/12/2024   LAMBDASER 95.6 (H) 01/29/2024   KAPLAMBRATIO 0.04 (L) 02/26/2024   KAPLAMBRATIO 0.01 (L) 02/12/2024   KAPLAMBRATIO 0.04 (L) 01/29/2024    RADIOGRAPHIC STUDIES: ECHOCARDIOGRAM COMPLETE Result Date: 03/10/2024    ECHOCARDIOGRAM REPORT   Patient Name:   SYANNE LOONEY Date of Exam: 03/10/2024 Medical Rec #:  [id number]         Height:       66.0 in Accession #:    [id number]        Weight:       139.7 lb Date of Birth:  [birth year]         BSA:          1.717 m Patient Age:    77 years          BP:           131/83/83 mmHg Patient Gender: F                 HR:           72 bpm. Exam Location:  [address] Procedure: 2D Echo, Cardiac Doppler, Color Doppler and Strain Analysis (Both            Spectral and Color Flow Doppler  were utilized during procedure). Indications:    Maalignant neoplasm z08  History:        Patient has no prior history of Echocardiogram examinations.                 CAD, Signs/Symptoms:Dizziness/Lightheadedness; Risk  Factors:Dyslipidemia and Hypertension.  Sonographer:    Alicia Inoue Referring Phys: [id number] IRENE T THAYIL IMPRESSIONS  1. Left ventricular ejection fraction, by estimation, is 50 to 55%. The left ventricle has low normal function. The left ventricle demonstrates global hypokinesis. There is mild left ventricular hypertrophy of the basal-septal segment. Left ventricular diastolic parameters are consistent with Grade I diastolic dysfunction (impaired relaxation). The average left ventricular global longitudinal strain is -13.1 %. The global longitudinal strain is abnormal.  2. Right ventricular systolic function is normal. The right ventricular size is normal. Tricuspid regurgitation signal is inadequate for assessing PA pressure.  3. The mitral valve is normal in structure. No evidence of mitral valve regurgitation. No evidence of mitral stenosis.  4. The aortic valve is normal in structure. Aortic valve regurgitation is trivial. No aortic stenosis is present.  5. The inferior vena cava is normal in size with greater than 50% respiratory variability, suggesting right atrial pressure of 3 mmHg. FINDINGS  Left Ventricle: Left ventricular ejection fraction, by estimation, is 50 to 55%. The left ventricle has low normal function. The left ventricle demonstrates global hypokinesis. The average left ventricular global longitudinal strain is -13.1 %. Strain was performed and the global longitudinal strain is abnormal. The left ventricular internal cavity size was normal in size. There is mild left ventricular hypertrophy of the basal-septal segment. Left ventricular diastolic parameters are consistent with Grade I diastolic dysfunction (impaired relaxation). Normal left ventricular filling  pressure. Right Ventricle: The right ventricular size is normal. No increase in right ventricular wall thickness. Right ventricular systolic function is normal. Tricuspid regurgitation signal is inadequate for assessing PA pressure. Left Atrium: Left atrial size was normal in size. Right Atrium: Right atrial size was normal in size. Pericardium: There is no evidence of pericardial effusion. Mitral Valve: The mitral valve is normal in structure. No evidence of mitral valve regurgitation. No evidence of mitral valve stenosis. Tricuspid Valve: The tricuspid valve is normal in structure. Tricuspid valve regurgitation is not demonstrated. No evidence of tricuspid stenosis. Aortic Valve: The aortic valve is normal in structure. Aortic valve regurgitation is trivial. No aortic stenosis is present. Aortic valve mean gradient measures 3.5 mmHg. Aortic valve peak gradient measures 6.4 mmHg. Aortic valve area, by VTI measures 2.36 cm. Pulmonic Valve: The pulmonic valve was normal in structure. Pulmonic valve regurgitation is mild. No evidence of pulmonic stenosis. Aorta: The aortic root is normal in size and structure. Venous: The inferior vena cava is normal in size with greater than 50% respiratory variability, suggesting right atrial pressure of 3 mmHg. IAS/Shunts: No atrial level shunt detected by color flow Doppler.  LEFT VENTRICLE PLAX 2D LVIDd:         4.20 cm      Diastology LVIDs:         3.27 cm      LV e' medial:    5.55 cm/s LV PW:         1.08 cm      LV E/e' medial:  8.2 LV IVS:        1.14 cm      LV e' lateral:   7.51 cm/s LVOT diam:     2.10 cm      LV E/e' lateral: 6.0 LV SV:         56 LV SV Index:   33           2D Longitudinal Strain LVOT Area:     3.46 cm  2D Strain GLS (A4C):   -13.1 %                             2D Strain GLS (A3C):   -12.2 %                             2D Strain GLS (A2C):   -13.9 % LV Volumes (MOD)            2D Strain GLS Avg:     -13.1 % LV vol d, MOD A2C: 86.8 ml LV vol d, MOD  A4C: 101.0 ml LV vol s, MOD A2C: 42.3 ml LV vol s, MOD A4C: 45.5 ml LV SV MOD A2C:     44.5 ml LV SV MOD A4C:     101.0 ml LV SV MOD BP:      50.0 ml RIGHT VENTRICLE             IVC RV Basal diam:  3.75 cm     IVC diam: 1.58 cm RV Mid diam:    2.24 cm RV S prime:     17.00 cm/s TAPSE (M-mode): 2.2 cm LEFT ATRIUM             Index        RIGHT ATRIUM           Index LA Vol (A2C):   31.9 ml 18.58 ml/m  RA Pressure: 3.00 mmHg LA Vol (A4C):   28.7 ml 16.71 ml/m  RA Area:     14.80 cm LA Biplane Vol: 31.4 ml 18.29 ml/m  RA Volume:   31.80 ml  18.52 ml/m  AORTIC VALVE AV Area (Vmax):    2.46 cm AV Area (Vmean):   2.37 cm AV Area (VTI):     2.36 cm AV Vmax:           126.50 cm/s AV Vmean:          86.950 cm/s AV VTI:            0.239 m AV Peak Grad:      6.4 mmHg AV Mean Grad:      3.5 mmHg LVOT Vmax:         89.70 cm/s LVOT Vmean:        59.600 cm/s LVOT VTI:          0.163 m LVOT/AV VTI ratio: 0.68  AORTA Ao Sinus diam: 2.94 cm Ao STJ diam:   2.6 cm Ao Asc diam:   3.50 cm MV E velocity: 45.30 cm/s  TRICUSPID VALVE MV A velocity: 88.30 cm/s  Estimated RAP:  3.00 mmHg MV E/A ratio:  0.51                            SHUNTS                            Systemic VTI:  0.16 m                            Systemic Diam: 2.10 cm Gaylyn Keas MD Electronically signed by Gaylyn Keas MD Signature Date/Time: 03/10/2024/5:04:55 PM    Final      ASSESSMENT & PLAN Doris Wilkerson is a 78 y.o. female with medical history significant  for lambda light Wilkerson multiple myeloma who presents for a follow up visit. Ms. Wilkerson is not a candidate for bone marrow transplant based on her advanced age.    # Lambda Light Wilkerson Multiple Myeloma--VGPR on maintenance --diagnosis confirmed with bone marrow biopsy and urine protein analysis.   --patient has good functional status and would be an excellent candidate for VRd chemotherapy. I do not believe she would be a bone marrow transplant candidate based on her age.  --Received VRD  chemotherapy from 02/01/2021-07/12/2021 then started maintenance Revlimid  on 07/19/2021.  --Due to rising lambda light Wilkerson, recommended to switch to Daratumumab /Pomalyst /Dexamethasone . --Received Dara/Pom/Dex from 03/27/2023-08/28/2023. --Due to rising lambda light Wilkerson, recommend to switch to Kyprolis /Dex 3 weeks on 1 week off, starting today. Plan: --Due to start Cycle 7, Day 15 of [place]/Dex today. After today will be holding treatment in preparation for CART therapy at [facility].  --patient is planned to start Carvykti --Labs today show white blood cell count 4.7, hemoglobin 10.8, MCV 95, platelets 164. Creatinine slighly above normal at 1.09. LFTs normal. Myeloma labs pending.  --Plan to obtain myeloma labs every cycle.  --Patient following with Dr. Joseph Nickel at [facility] and is planning to pursue CAR-T.  --Proceed with treatment today without any dose modifications.  --Return to clinic pending recommendations by [facility].  They have requested we hold treatments moving forward.   #Urinary urgency: --UA plus culture confirmed UTI with gram negative rods.  --Sent ciprofloxacin  250 mg BID x 5 days.  --Encouraged PO hydration.  #Back Pain/Left Hip Pain  -- Currently well controlled on gabapentin  and Tylenol  --CT thoracic and lumbar spine showed no lytic lesions or sequelae of multiple myeloma --MRI thoracic and lumbar spine from 07/07/2022 for baseline imaging. Findings showed multiple bone lesions involving posterior T9 and T8, lumbar spine and upper sacrum consistent with multiple myeloma. No extension into the canal. No pathologic fracture.  --continue follow up with Dr .Gearl Keens, neurosugery --Continue to monitor  #Supportive Care --chemotherapy education complete  --port placement complete on 09/09/2023. EMLA  cream sent today.  --zofran  8mg  q8H PRN and compazine  10mg  PO q6H for nausea -- acyclovir  400mg  PO BID for VCZ prophylaxis --2 year of zometa  therapy completed in July 2024.  -- no  prescription pain medication required at this time.   No orders of the defined types were placed in this encounter.  All questions were answered. The patient knows to call the clinic with any problems, questions or concerns.  I have spent a total of 30 minutes minutes of face-to-face and non-face-to-face time, preparing to see the patient, performing a medically appropriate examination, counseling and educating the patient, documenting clinical information in the electronic health record.   Rogerio Clay, MD Department of Hematology/Oncology [facility] Phone: [phone] Pager: [phone] Email: [email or web address]  03/12/2024 4:05 PM

## 2024-03-11 NOTE — Patient Instructions (Signed)

## 2024-03-11 NOTE — Progress Notes (Signed)
 Patient states she took dexamethasone  prior to arrival.

## 2024-03-12 ENCOUNTER — Encounter: Payer: Self-pay | Admitting: Hematology and Oncology

## 2024-03-12 DIAGNOSIS — Z23 Encounter for immunization: Secondary | ICD-10-CM | POA: Diagnosis not present

## 2024-03-14 LAB — MULTIPLE MYELOMA PANEL, SERUM
Albumin SerPl Elph-Mcnc: 3.5 g/dL (ref 2.9–4.4)
Albumin/Glob SerPl: 2 — ABNORMAL HIGH (ref 0.7–1.7)
Alpha 1: 0.2 g/dL (ref 0.0–0.4)
Alpha2 Glob SerPl Elph-Mcnc: 0.6 g/dL (ref 0.4–1.0)
B-Globulin SerPl Elph-Mcnc: 0.8 g/dL (ref 0.7–1.3)
Gamma Glob SerPl Elph-Mcnc: 0.2 g/dL — ABNORMAL LOW (ref 0.4–1.8)
Globulin, Total: 1.8 g/dL — ABNORMAL LOW (ref 2.2–3.9)
IgA: 20 mg/dL — ABNORMAL LOW (ref 64–422)
IgG (Immunoglobin G), Serum: 228 mg/dL — ABNORMAL LOW (ref 586–1602)
IgM (Immunoglobulin M), Srm: <5 mg/dL — ABNORMAL LOW (ref 26–217)
Total Protein ELP: 5.3 g/dL — ABNORMAL LOW (ref 6.0–8.5)

## 2024-03-14 LAB — KAPPA/LAMBDA LIGHT CHAINS
Kappa free light chain: 3.9 mg/L (ref 3.3–19.4)
Kappa, lambda light chain ratio: 0.03 — ABNORMAL LOW (ref 0.26–1.65)
Lambda free light chains: 155.5 mg/L — ABNORMAL HIGH (ref 5.7–26.3)

## 2024-03-24 ENCOUNTER — Other Ambulatory Visit: Payer: Self-pay | Admitting: Cardiology

## 2024-03-24 DIAGNOSIS — I2510 Atherosclerotic heart disease of native coronary artery without angina pectoris: Secondary | ICD-10-CM | POA: Diagnosis not present

## 2024-03-24 DIAGNOSIS — C9000 Multiple myeloma not having achieved remission: Secondary | ICD-10-CM | POA: Diagnosis not present

## 2024-03-24 DIAGNOSIS — Z452 Encounter for adjustment and management of vascular access device: Secondary | ICD-10-CM | POA: Diagnosis not present

## 2024-03-24 DIAGNOSIS — I25118 Atherosclerotic heart disease of native coronary artery with other forms of angina pectoris: Secondary | ICD-10-CM

## 2024-03-24 DIAGNOSIS — I10 Essential (primary) hypertension: Secondary | ICD-10-CM

## 2024-03-24 DIAGNOSIS — Z955 Presence of coronary angioplasty implant and graft: Secondary | ICD-10-CM | POA: Diagnosis not present

## 2024-03-25 ENCOUNTER — Other Ambulatory Visit: Payer: Medicare Other

## 2024-03-25 ENCOUNTER — Ambulatory Visit: Payer: Medicare Other | Admitting: Hematology and Oncology

## 2024-03-25 ENCOUNTER — Ambulatory Visit: Payer: Medicare Other

## 2024-03-25 DIAGNOSIS — R21 Rash and other nonspecific skin eruption: Secondary | ICD-10-CM | POA: Diagnosis not present

## 2024-03-25 DIAGNOSIS — T63481A Toxic effect of venom of other arthropod, accidental (unintentional), initial encounter: Secondary | ICD-10-CM | POA: Diagnosis not present

## 2024-03-26 ENCOUNTER — Encounter: Payer: Self-pay | Admitting: Hematology and Oncology

## 2024-03-28 ENCOUNTER — Encounter: Payer: Self-pay | Admitting: *Deleted

## 2024-03-31 DIAGNOSIS — R799 Abnormal finding of blood chemistry, unspecified: Secondary | ICD-10-CM | POA: Insufficient documentation

## 2024-04-01 ENCOUNTER — Ambulatory Visit: Payer: Medicare Other

## 2024-04-01 ENCOUNTER — Other Ambulatory Visit: Payer: Medicare Other

## 2024-04-05 ENCOUNTER — Other Ambulatory Visit: Payer: Self-pay | Admitting: *Deleted

## 2024-04-05 DIAGNOSIS — D6959 Other secondary thrombocytopenia: Secondary | ICD-10-CM | POA: Diagnosis not present

## 2024-04-05 DIAGNOSIS — T451X5A Adverse effect of antineoplastic and immunosuppressive drugs, initial encounter: Secondary | ICD-10-CM | POA: Diagnosis not present

## 2024-04-05 DIAGNOSIS — Z5112 Encounter for antineoplastic immunotherapy: Secondary | ICD-10-CM | POA: Diagnosis not present

## 2024-04-05 DIAGNOSIS — C9000 Multiple myeloma not having achieved remission: Secondary | ICD-10-CM | POA: Diagnosis not present

## 2024-04-05 DIAGNOSIS — D8481 Immunodeficiency due to conditions classified elsewhere: Secondary | ICD-10-CM | POA: Diagnosis not present

## 2024-04-05 DIAGNOSIS — C9002 Multiple myeloma in relapse: Secondary | ICD-10-CM | POA: Diagnosis not present

## 2024-04-05 DIAGNOSIS — K219 Gastro-esophageal reflux disease without esophagitis: Secondary | ICD-10-CM | POA: Diagnosis not present

## 2024-04-05 DIAGNOSIS — Z7982 Long term (current) use of aspirin: Secondary | ICD-10-CM | POA: Diagnosis not present

## 2024-04-05 DIAGNOSIS — I10 Essential (primary) hypertension: Secondary | ICD-10-CM | POA: Diagnosis not present

## 2024-04-05 DIAGNOSIS — Z79890 Hormone replacement therapy (postmenopausal): Secondary | ICD-10-CM | POA: Diagnosis not present

## 2024-04-05 DIAGNOSIS — R918 Other nonspecific abnormal finding of lung field: Secondary | ICD-10-CM | POA: Diagnosis not present

## 2024-04-05 DIAGNOSIS — D84821 Immunodeficiency due to drugs: Secondary | ICD-10-CM | POA: Diagnosis not present

## 2024-04-05 DIAGNOSIS — G893 Neoplasm related pain (acute) (chronic): Secondary | ICD-10-CM | POA: Diagnosis not present

## 2024-04-05 DIAGNOSIS — D630 Anemia in neoplastic disease: Secondary | ICD-10-CM | POA: Diagnosis not present

## 2024-04-05 DIAGNOSIS — I2510 Atherosclerotic heart disease of native coronary artery without angina pectoris: Secondary | ICD-10-CM | POA: Diagnosis not present

## 2024-04-05 DIAGNOSIS — Z5111 Encounter for antineoplastic chemotherapy: Secondary | ICD-10-CM | POA: Diagnosis not present

## 2024-04-05 DIAGNOSIS — T380X5A Adverse effect of glucocorticoids and synthetic analogues, initial encounter: Secondary | ICD-10-CM | POA: Diagnosis not present

## 2024-04-05 DIAGNOSIS — R0781 Pleurodynia: Secondary | ICD-10-CM | POA: Diagnosis not present

## 2024-04-05 DIAGNOSIS — G2581 Restless legs syndrome: Secondary | ICD-10-CM | POA: Diagnosis not present

## 2024-04-05 DIAGNOSIS — E039 Hypothyroidism, unspecified: Secondary | ICD-10-CM | POA: Diagnosis not present

## 2024-04-05 DIAGNOSIS — D6869 Other thrombophilia: Secondary | ICD-10-CM | POA: Diagnosis not present

## 2024-04-05 DIAGNOSIS — H04123 Dry eye syndrome of bilateral lacrimal glands: Secondary | ICD-10-CM | POA: Diagnosis not present

## 2024-04-05 DIAGNOSIS — D89831 Cytokine release syndrome, grade 1: Secondary | ICD-10-CM | POA: Diagnosis not present

## 2024-04-05 DIAGNOSIS — Z66 Do not resuscitate: Secondary | ICD-10-CM | POA: Diagnosis not present

## 2024-04-05 DIAGNOSIS — Z888 Allergy status to other drugs, medicaments and biological substances status: Secondary | ICD-10-CM | POA: Diagnosis not present

## 2024-04-05 DIAGNOSIS — Z79899 Other long term (current) drug therapy: Secondary | ICD-10-CM | POA: Diagnosis not present

## 2024-04-05 DIAGNOSIS — R799 Abnormal finding of blood chemistry, unspecified: Secondary | ICD-10-CM | POA: Diagnosis not present

## 2024-04-05 DIAGNOSIS — M25552 Pain in left hip: Secondary | ICD-10-CM | POA: Diagnosis not present

## 2024-04-05 DIAGNOSIS — Z955 Presence of coronary angioplasty implant and graft: Secondary | ICD-10-CM | POA: Diagnosis not present

## 2024-04-05 MED ORDER — ACYCLOVIR 400 MG PO TABS: 400.0000 mg | ORAL_TABLET | Freq: Two times a day (BID) | ORAL | 2 refills | Status: DC

## 2024-04-06 ENCOUNTER — Other Ambulatory Visit: Payer: Self-pay | Admitting: Family Medicine

## 2024-04-06 ENCOUNTER — Encounter: Payer: Self-pay | Admitting: Cardiology

## 2024-04-06 ENCOUNTER — Encounter: Payer: Self-pay | Admitting: Physician Assistant

## 2024-04-06 ENCOUNTER — Encounter: Payer: Self-pay | Admitting: Family Medicine

## 2024-04-06 DIAGNOSIS — D6959 Other secondary thrombocytopenia: Secondary | ICD-10-CM | POA: Diagnosis not present

## 2024-04-06 DIAGNOSIS — D630 Anemia in neoplastic disease: Secondary | ICD-10-CM | POA: Diagnosis not present

## 2024-04-06 DIAGNOSIS — Z5112 Encounter for antineoplastic immunotherapy: Secondary | ICD-10-CM | POA: Diagnosis not present

## 2024-04-06 DIAGNOSIS — D8481 Immunodeficiency due to conditions classified elsewhere: Secondary | ICD-10-CM | POA: Diagnosis not present

## 2024-04-06 DIAGNOSIS — G6289 Other specified polyneuropathies: Secondary | ICD-10-CM

## 2024-04-06 DIAGNOSIS — C9002 Multiple myeloma in relapse: Secondary | ICD-10-CM | POA: Diagnosis not present

## 2024-04-06 NOTE — Telephone Encounter (Signed)
 Patient has sent message to update us  on pharmacy preferences as well as inform us  she is being referred to Dr Joseph Nickel in [facility] for "Talvey bi-specific immunotherapy followed by CAR-T Carviktii treatment" and she is currently at [facility] for [employer] and she will be living in [place] through June and July but will be here for appt in August.

## 2024-04-07 DIAGNOSIS — Z5111 Encounter for antineoplastic chemotherapy: Secondary | ICD-10-CM | POA: Diagnosis not present

## 2024-04-07 DIAGNOSIS — G893 Neoplasm related pain (acute) (chronic): Secondary | ICD-10-CM | POA: Diagnosis not present

## 2024-04-07 DIAGNOSIS — C9002 Multiple myeloma in relapse: Secondary | ICD-10-CM | POA: Diagnosis not present

## 2024-04-07 DIAGNOSIS — D84821 Immunodeficiency due to drugs: Secondary | ICD-10-CM | POA: Diagnosis not present

## 2024-04-07 DIAGNOSIS — T451X5A Adverse effect of antineoplastic and immunosuppressive drugs, initial encounter: Secondary | ICD-10-CM | POA: Diagnosis not present

## 2024-04-08 ENCOUNTER — Other Ambulatory Visit: Payer: Self-pay | Admitting: Cardiology

## 2024-04-08 ENCOUNTER — Ambulatory Visit: Payer: Medicare Other | Admitting: Physician Assistant

## 2024-04-08 ENCOUNTER — Other Ambulatory Visit: Payer: Self-pay

## 2024-04-08 ENCOUNTER — Other Ambulatory Visit: Payer: Medicare Other

## 2024-04-08 ENCOUNTER — Ambulatory Visit: Payer: Medicare Other

## 2024-04-08 DIAGNOSIS — I10 Essential (primary) hypertension: Secondary | ICD-10-CM

## 2024-04-08 DIAGNOSIS — E782 Mixed hyperlipidemia: Secondary | ICD-10-CM

## 2024-04-08 DIAGNOSIS — I25118 Atherosclerotic heart disease of native coronary artery with other forms of angina pectoris: Secondary | ICD-10-CM

## 2024-04-08 MED ORDER — AMLODIPINE BESYLATE 5 MG PO TABS: 5.0000 mg | ORAL_TABLET | Freq: Every day | ORAL | 3 refills | Status: DC

## 2024-04-08 MED ORDER — BENAZEPRIL HCL 10 MG PO TABS: ORAL_TABLET | ORAL | 1 refills | Status: DC

## 2024-04-08 MED ORDER — GABAPENTIN 100 MG PO CAPS: ORAL_CAPSULE | ORAL | 1 refills | Status: DC

## 2024-04-08 NOTE — Telephone Encounter (Signed)
 Can we look over her meds and make sure those are sent to the appropriate location.  It looks like a 90-day supply of gabapentin  was just sent to [facility] a couple days ago.  I will resend that prescription to [facility].  Thanks

## 2024-04-11 ENCOUNTER — Encounter: Payer: Self-pay | Admitting: Physician Assistant

## 2024-04-11 ENCOUNTER — Other Ambulatory Visit: Payer: Self-pay | Admitting: Physician Assistant

## 2024-04-15 ENCOUNTER — Telehealth: Payer: Self-pay | Admitting: *Deleted

## 2024-04-15 NOTE — Transitions of Care (Post Inpatient/ED Visit) (Signed)
 04/15/2024  Name: Doris Wilkerson MRN: [id number] DOB: Apr 11, 1946  Today's TOC FU Call Status: Today's TOC FU Call Status:: Successful TOC FU Call Completed TOC FU Call Complete Date: 04/15/24 Patient's Name and Date of Birth confirmed.  Transition Care Management Follow-up Telephone Call Date of Discharge: 04/14/24 Discharge Facility: Other Mudlogger) Name of Other ([facility]) Discharge Facility: [facility] Type of Discharge: Inpatient Admission Primary Inpatient Discharge Diagnosis:: Planned chemotherapy treatment for multiple myeloma- per patient report "T-cell treatment; I was hospitalized to manage the side effects of the treatment" How have you been since you were released from the hospital?: Better ("I am doing fine; this was a planned surgery for mutliple myeloma, for monitoring of side effects; I am doing fine and having no pain so far.  Have lots of oncology appointments coming up; I will call Dr. Ester Helms if I need anything on his end") Any questions or concerns?: No  Items Reviewed: Did you receive and understand the discharge instructions provided?: Yes (briefly reviewed with patient who verbalizes good understanding of same - outside hospital AVS) Medications obtained,verified, and reconciled?: Yes (Medications Reviewed) (Full medication reconciliation/ review completed; no concerns or discrepancies identified; confirmed patient obtained/ is taking all newly Rx'd medications as instructed; self-manages medications and denies questions/ concerns around medications today) Any new allergies since your discharge?: No Dietary orders reviewed?: Yes Type of Diet Ordered:: "Regular, healthy as possible" Do you have support at home?: Yes People in Home [RPT]: spouse Name of Support/Comfort Primary Source: Reports independent in self-care activities; supportive spouse assists as/ if needed/ indicated  Medications Reviewed Today: Medications Reviewed Today      Reviewed by Azarya Oconnell M, RN (Registered Nurse) on 04/15/24 at 1314  Med List Status: <None>   Medication Order Taking? Sig Documenting Provider Last Dose Status Informant  acyclovir  (ZOVIRAX ) 400 MG tablet [id number] Yes Take 1 tablet (400 mg total) by mouth 2 (two) times daily. Ander Bame, MD Taking Active   amLODipine  (NORVASC ) 5 MG tablet [id number] Yes Take 1 tablet (5 mg total) by mouth daily. Knox Perl, MD Taking Active   aspirin 81 MG tablet [id number] Yes Take 81 mg by mouth daily.  Allene Ivan, MD Taking Active            Med Note Alana Hoyle   Fri Jul 19, 2021 10:25 AM)    atorvastatin  (LIPITOR) 80 MG tablet [id number] Yes TAKE 1 TABLET(80 MG) BY MOUTH DAILY AT 6 PM Benjiman Bras, MD Taking Active   benazepril  (LOTENSIN ) 10 MG tablet [id number] No TAKE 1 TABLET(10 MG) BY MOUTH DAILY  Patient not taking: Reported on 04/15/2024   Benjiman Bras, MD Not Taking Active            Med Note (Deniese Oberry M   Fri Apr 15, 2024 12:58 PM) 04/15/24- Reports during [facility] call- this has been put on hold by [facility] oncology provider post- hospital discharge on 04/14/24- due to risk of low blood pressure after recent IP planned hospital admission for chemotherapy treatment  Blood Glucose Monitoring Suppl DEVI [id number] No 1 each by Does not apply route as needed (symptoms of low blood sugar.). May substitute to any manufacturer covered by patient's insurance.  Patient not taking: Reported on 04/15/2024   Benjiman Bras, MD Not Taking Active   carfilzomib  (KYPROLIS ) 10 MG SOLR [id number] No Inject 70 mg into the vein once a week.  Patient not taking: Reported on 04/15/2024  [provider] Not Taking Active            Med Note Andres Bangs   Fri Apr 15, 2024  1:06 PM) 04/15/24- Reports during TOC call- no longer on this treatment  cholecalciferol (VITAMIN D3) 25 MCG (1000 UNIT) tablet [id number] Yes Take 2,000 Units by mouth daily. [provider] Taking  Active   Continuous Glucose Sensor (FREESTYLE LIBRE 3 SENSOR) [place] [id number] No 1 Device by Does not apply route continuous. Place 1 sensor on the skin every 14 days. Use to check glucose continuously  Patient not taking: Reported on 04/15/2024   Motwani, Komal, MD Not Taking Active   famotidine (PEPCID) 20 MG tablet [id number] Yes Take 20 mg by mouth daily. [provider] Taking Active   gabapentin  (NEURONTIN ) 100 MG capsule [id number] Yes TAKE 1 CAPSULE(100 MG) BY MOUTH TWICE DAILY AS NEEDED Benjiman Bras, MD Taking Active            Med Note (Denys Labree M   Fri Apr 15, 2024  1:00 PM) 04/15/24- Reports during [facility] call- this dose was increased to TID by [facility] oncology provider post- hospital discharge on 04/14/24- (IP planned hospital admission for chemotherapy treatment)   levothyroxine  (SYNTHROID ) 25 MCG tablet [id number] Yes TAKE 1 TABLET(25 MCG) BY MOUTH DAILY BEFORE BREAKFAST Tabori, Katherine E, MD Taking Active   lidocaine -prilocaine  (EMLA ) cream [id number] Yes Apply 1 Application topically as needed. Thayil, Irene T, PA-C Taking Active            Med Note (Ryne Mctigue M   Fri Apr 15, 2024  1:05 PM) 04/15/24- Reports during TOC call- has not needed recently  Magnesium 100 MG TABS [id number] Yes Take 100 mg by mouth daily. [provider] Taking Active   nitroGLYCERIN  (NITROSTAT ) 0.4 MG SL tablet [id number]  Place 1 tablet (0.4 mg total) under the tongue every 5 (five) minutes as needed for chest pain. Knox Perl, MD  Expired 12/30/23 2359            Med Note (HAZLIP, JESSICA K   Wed Sep 09, 2023  8:05 AM) Never had to use  oxycodone (OXY-IR) 5 MG capsule [id number] Yes Take 5 mg by mouth every 4 (four) hours as needed for pain. 04/15/24- Reports during TOC call- this was prescribed at time of recent hospital discharge for IP planned chemotherapy treatment: patient reports "using very little; no pain" Benjiman Bras, MD Taking Active Self           Med Note (Kaio Kuhlman,  Iva Posten M   Fri Apr 15, 2024  1:09 PM) 04/15/24- Reports during [facility] call- this was prescribed at time of recent hospital discharge for IP planned chemotherapy treatment: patient reports "using very little; no pain"   polyethylene glycol (MIRALAX / GLYCOLAX) 17 g packet [id number] Yes Take 17 g by mouth daily as needed. [provider] Taking Active   traMADol  (ULTRAM ) 50 MG tablet [id number] Yes Take 1 tablet (50 mg total) by mouth every 6 (six) hours as needed. Ander Bame, MD Taking Active            Med Note (Demari Gales M   Fri Apr 15, 2024  1:05 PM) 04/15/24- Reports during [facility] call- has not needed recently   Med List Note [address], [facility] 03/05/23 1318): Pomalyst  filled by [employer] by Carlo Chessman e-scribe refills           Home Care and Equipment/Supplies: Were Home Health Services  Ordered?: No Any new equipment or medical supplies ordered?: No  Functional Questionnaire: Do you need assistance with bathing/showering or dressing?: No Do you need assistance with meal preparation?: No Do you need assistance with eating?: No Do you have difficulty maintaining continence: No Do you need assistance with getting out of bed/getting out of a chair/moving?: No Do you have difficulty managing or taking your medications?: No  Follow up appointments reviewed: PCP Follow-up appointment confirmed?: NA (verified not indicated per hospital discharging provider discharge notes - patient declines scheduling with PCP at this time- states she will schedule with PCP as/ if needed around [facility] oncology provider appointments) Specialist Hospital Follow-up appointment confirmed?: Yes Date of Specialist follow-up appointment?: 04/25/24 (verified this is recommended time frame for follow up per hospital discharging provider notes) Follow-Up Specialty Provider:: [facility]- oncology provider Do you need transportation to your follow-up appointment?: No Do you understand care options if your  condition(s) worsen?: Yes-patient verbalized understanding  SDOH Interventions Today    Flowsheet Row Most Recent Value  SDOH Interventions   Food Insecurity Interventions Intervention Not Indicated  Housing Interventions Intervention Not Indicated  Transportation Interventions Intervention Not Indicated  [drives self at baseline,  husband assists as needed post-recent planned hospital admission for monitoring of chemotherapy side effects]  Utilities Interventions Intervention Not Indicated      See TOC assessment tabs for additional assessment/ TOC intervention information  Pls call/ message for questions,  Erlene Hawks, RN, BSN, Media planner  Transitions of Care  [facility] - [facility] [phone]: direct office

## 2024-04-21 ENCOUNTER — Other Ambulatory Visit: Payer: Self-pay | Admitting: Hematology and Oncology

## 2024-04-22 ENCOUNTER — Other Ambulatory Visit

## 2024-04-22 ENCOUNTER — Ambulatory Visit: Admitting: Hematology and Oncology

## 2024-04-22 ENCOUNTER — Ambulatory Visit

## 2024-04-25 ENCOUNTER — Encounter: Payer: Self-pay | Admitting: Hematology and Oncology

## 2024-04-25 ENCOUNTER — Other Ambulatory Visit (HOSPITAL_COMMUNITY): Payer: Self-pay

## 2024-04-25 DIAGNOSIS — C9002 Multiple myeloma in relapse: Secondary | ICD-10-CM | POA: Diagnosis not present

## 2024-04-25 DIAGNOSIS — N182 Chronic kidney disease, stage 2 (mild): Secondary | ICD-10-CM | POA: Diagnosis not present

## 2024-04-25 DIAGNOSIS — C9000 Multiple myeloma not having achieved remission: Secondary | ICD-10-CM | POA: Diagnosis not present

## 2024-04-25 DIAGNOSIS — R799 Abnormal finding of blood chemistry, unspecified: Secondary | ICD-10-CM | POA: Diagnosis not present

## 2024-04-25 DIAGNOSIS — Z66 Do not resuscitate: Secondary | ICD-10-CM | POA: Diagnosis not present

## 2024-04-25 MED ORDER — DEXAMETHASONE 0.5 MG/5ML PO SOLN
0.5000 mg | Freq: Four times a day (QID) | ORAL | 1 refills | Status: DC | PRN
  Filled 2024-04-25: qty 240, 12d supply, fill #0

## 2024-04-29 ENCOUNTER — Other Ambulatory Visit

## 2024-04-29 ENCOUNTER — Ambulatory Visit

## 2024-05-02 ENCOUNTER — Other Ambulatory Visit: Payer: Self-pay | Admitting: Family Medicine

## 2024-05-02 DIAGNOSIS — E782 Mixed hyperlipidemia: Secondary | ICD-10-CM

## 2024-05-02 DIAGNOSIS — I10 Essential (primary) hypertension: Secondary | ICD-10-CM

## 2024-05-06 ENCOUNTER — Ambulatory Visit

## 2024-05-06 ENCOUNTER — Other Ambulatory Visit

## 2024-05-06 ENCOUNTER — Ambulatory Visit: Admitting: Physician Assistant

## 2024-05-10 DIAGNOSIS — R799 Abnormal finding of blood chemistry, unspecified: Secondary | ICD-10-CM | POA: Diagnosis not present

## 2024-05-10 DIAGNOSIS — C9000 Multiple myeloma not having achieved remission: Secondary | ICD-10-CM | POA: Diagnosis not present

## 2024-05-11 DIAGNOSIS — I10 Essential (primary) hypertension: Secondary | ICD-10-CM | POA: Diagnosis not present

## 2024-05-11 DIAGNOSIS — C9002 Multiple myeloma in relapse: Secondary | ICD-10-CM | POA: Diagnosis not present

## 2024-05-11 DIAGNOSIS — Z79890 Hormone replacement therapy (postmenopausal): Secondary | ICD-10-CM | POA: Diagnosis not present

## 2024-05-11 DIAGNOSIS — Z66 Do not resuscitate: Secondary | ICD-10-CM | POA: Diagnosis not present

## 2024-05-11 DIAGNOSIS — C9000 Multiple myeloma not having achieved remission: Secondary | ICD-10-CM | POA: Diagnosis not present

## 2024-05-11 DIAGNOSIS — R799 Abnormal finding of blood chemistry, unspecified: Secondary | ICD-10-CM | POA: Diagnosis not present

## 2024-05-11 DIAGNOSIS — Z79899 Other long term (current) drug therapy: Secondary | ICD-10-CM | POA: Diagnosis not present

## 2024-05-11 DIAGNOSIS — D801 Nonfamilial hypogammaglobulinemia: Secondary | ICD-10-CM | POA: Diagnosis not present

## 2024-05-11 DIAGNOSIS — Z713 Dietary counseling and surveillance: Secondary | ICD-10-CM | POA: Diagnosis not present

## 2024-05-11 DIAGNOSIS — R1310 Dysphagia, unspecified: Secondary | ICD-10-CM | POA: Diagnosis not present

## 2024-05-11 DIAGNOSIS — E785 Hyperlipidemia, unspecified: Secondary | ICD-10-CM | POA: Diagnosis not present

## 2024-05-11 DIAGNOSIS — Z5111 Encounter for antineoplastic chemotherapy: Secondary | ICD-10-CM | POA: Diagnosis not present

## 2024-05-12 DIAGNOSIS — R799 Abnormal finding of blood chemistry, unspecified: Secondary | ICD-10-CM | POA: Diagnosis not present

## 2024-05-12 DIAGNOSIS — C9000 Multiple myeloma not having achieved remission: Secondary | ICD-10-CM | POA: Diagnosis not present

## 2024-05-12 DIAGNOSIS — Z5111 Encounter for antineoplastic chemotherapy: Secondary | ICD-10-CM | POA: Diagnosis not present

## 2024-05-13 DIAGNOSIS — C9000 Multiple myeloma not having achieved remission: Secondary | ICD-10-CM | POA: Diagnosis not present

## 2024-05-13 DIAGNOSIS — R799 Abnormal finding of blood chemistry, unspecified: Secondary | ICD-10-CM | POA: Diagnosis not present

## 2024-05-13 DIAGNOSIS — Z5111 Encounter for antineoplastic chemotherapy: Secondary | ICD-10-CM | POA: Diagnosis not present

## 2024-05-16 ENCOUNTER — Telehealth: Payer: Self-pay

## 2024-05-16 DIAGNOSIS — R509 Fever, unspecified: Secondary | ICD-10-CM | POA: Diagnosis not present

## 2024-05-16 DIAGNOSIS — R0902 Hypoxemia: Secondary | ICD-10-CM | POA: Diagnosis not present

## 2024-05-16 DIAGNOSIS — I959 Hypotension, unspecified: Secondary | ICD-10-CM | POA: Diagnosis not present

## 2024-05-16 DIAGNOSIS — Z66 Do not resuscitate: Secondary | ICD-10-CM | POA: Diagnosis not present

## 2024-05-16 DIAGNOSIS — Z7982 Long term (current) use of aspirin: Secondary | ICD-10-CM | POA: Diagnosis not present

## 2024-05-16 DIAGNOSIS — R197 Diarrhea, unspecified: Secondary | ICD-10-CM | POA: Diagnosis not present

## 2024-05-16 DIAGNOSIS — C9000 Multiple myeloma not having achieved remission: Secondary | ICD-10-CM | POA: Diagnosis not present

## 2024-05-16 DIAGNOSIS — I10 Essential (primary) hypertension: Secondary | ICD-10-CM | POA: Diagnosis not present

## 2024-05-16 DIAGNOSIS — E785 Hyperlipidemia, unspecified: Secondary | ICD-10-CM | POA: Diagnosis not present

## 2024-05-16 DIAGNOSIS — C9002 Multiple myeloma in relapse: Secondary | ICD-10-CM | POA: Diagnosis not present

## 2024-05-16 DIAGNOSIS — Z79890 Hormone replacement therapy (postmenopausal): Secondary | ICD-10-CM | POA: Diagnosis not present

## 2024-05-16 DIAGNOSIS — R799 Abnormal finding of blood chemistry, unspecified: Secondary | ICD-10-CM | POA: Diagnosis not present

## 2024-05-16 DIAGNOSIS — E039 Hypothyroidism, unspecified: Secondary | ICD-10-CM | POA: Diagnosis not present

## 2024-05-16 DIAGNOSIS — Z79899 Other long term (current) drug therapy: Secondary | ICD-10-CM | POA: Diagnosis not present

## 2024-05-16 DIAGNOSIS — Z5112 Encounter for antineoplastic immunotherapy: Secondary | ICD-10-CM | POA: Diagnosis not present

## 2024-05-16 NOTE — Telephone Encounter (Signed)
 Yes, okay to change manufacturer, but with thyroid  meds when that is done I do recommend a lab only visit within 4 to 6 weeks for TSH, diagnosis hypothyroidism.  Thanks

## 2024-05-16 NOTE — Telephone Encounter (Signed)
OK to change manufacturer 

## 2024-05-16 NOTE — Telephone Encounter (Unsigned)
 Copied from CRM [id number]. Topic: Clinical - Prescription Issue >> May 16, 2024  3:51 PM Burnard DEL wrote: Reason for CRM: [employer] rx called to let provider know that Manufacturer name for levothyroxine  (SYNTHROID ) 25 MCG tablet will be changing to lupin,they are just needing the approve from provider for this change.  [facility] - [place], [place] - [address]  Phone: [phone] Fax: [phone]

## 2024-05-17 DIAGNOSIS — Z92850 Personal history of chimeric antigen receptor t-cell therapy: Secondary | ICD-10-CM | POA: Diagnosis not present

## 2024-05-17 DIAGNOSIS — C9000 Multiple myeloma not having achieved remission: Secondary | ICD-10-CM | POA: Diagnosis not present

## 2024-05-17 DIAGNOSIS — R432 Parageusia: Secondary | ICD-10-CM | POA: Diagnosis not present

## 2024-05-17 NOTE — Telephone Encounter (Signed)
 Called patient to inform her of changes with medications and to schedule lab visit. Left VM to return call   Did call [employer] to let them know this change is approved by Dr.Greene

## 2024-05-17 NOTE — Telephone Encounter (Signed)
 Copied from CRM [id number]. Topic: Clinical - Lab/Test Results >> May 17, 2024 12:10 PM Burnard DEL wrote: Reason for CRM: Patient returned call to [facility] regarding manufacture change of synthroid  medication.Patient stated that she plans to start medication at the end of July,so when she come in for her visit in August she will have labs drawn then for TSH,if that's okay with provider.

## 2024-05-17 NOTE — Telephone Encounter (Signed)
Yes, that works. Thanks!

## 2024-05-17 NOTE — Telephone Encounter (Signed)
 Patient states she plans to start medication in July and then will have labs completed at appointment in August. If that is okay with provider?

## 2024-05-17 NOTE — Telephone Encounter (Signed)
 Called and LM to inform her this was acceptable, no answer, LM

## 2024-05-18 DIAGNOSIS — I10 Essential (primary) hypertension: Secondary | ICD-10-CM | POA: Diagnosis not present

## 2024-05-18 DIAGNOSIS — C9000 Multiple myeloma not having achieved remission: Secondary | ICD-10-CM | POA: Diagnosis not present

## 2024-05-18 DIAGNOSIS — G4700 Insomnia, unspecified: Secondary | ICD-10-CM | POA: Diagnosis not present

## 2024-05-18 DIAGNOSIS — C9002 Multiple myeloma in relapse: Secondary | ICD-10-CM | POA: Diagnosis not present

## 2024-05-18 DIAGNOSIS — Z79890 Hormone replacement therapy (postmenopausal): Secondary | ICD-10-CM | POA: Diagnosis not present

## 2024-05-18 DIAGNOSIS — D801 Nonfamilial hypogammaglobulinemia: Secondary | ICD-10-CM | POA: Diagnosis not present

## 2024-05-18 DIAGNOSIS — E785 Hyperlipidemia, unspecified: Secondary | ICD-10-CM | POA: Diagnosis not present

## 2024-05-18 DIAGNOSIS — E039 Hypothyroidism, unspecified: Secondary | ICD-10-CM | POA: Diagnosis not present

## 2024-05-18 DIAGNOSIS — Z79899 Other long term (current) drug therapy: Secondary | ICD-10-CM | POA: Diagnosis not present

## 2024-05-19 DIAGNOSIS — D849 Immunodeficiency, unspecified: Secondary | ICD-10-CM | POA: Diagnosis not present

## 2024-05-19 DIAGNOSIS — D649 Anemia, unspecified: Secondary | ICD-10-CM | POA: Diagnosis not present

## 2024-05-19 DIAGNOSIS — C9000 Multiple myeloma not having achieved remission: Secondary | ICD-10-CM | POA: Diagnosis not present

## 2024-05-19 DIAGNOSIS — G620 Drug-induced polyneuropathy: Secondary | ICD-10-CM | POA: Diagnosis not present

## 2024-05-19 DIAGNOSIS — T8082XA Complication of immune effector cellular therapy, initial encounter: Secondary | ICD-10-CM | POA: Diagnosis not present

## 2024-05-19 DIAGNOSIS — Z91048 Other nonmedicinal substance allergy status: Secondary | ICD-10-CM | POA: Diagnosis not present

## 2024-05-19 DIAGNOSIS — Z7982 Long term (current) use of aspirin: Secondary | ICD-10-CM | POA: Diagnosis not present

## 2024-05-19 DIAGNOSIS — D801 Nonfamilial hypogammaglobulinemia: Secondary | ICD-10-CM | POA: Diagnosis not present

## 2024-05-19 DIAGNOSIS — D89831 Cytokine release syndrome, grade 1: Secondary | ICD-10-CM | POA: Diagnosis not present

## 2024-05-19 DIAGNOSIS — C9002 Multiple myeloma in relapse: Secondary | ICD-10-CM | POA: Diagnosis not present

## 2024-05-19 DIAGNOSIS — Z888 Allergy status to other drugs, medicaments and biological substances status: Secondary | ICD-10-CM | POA: Diagnosis not present

## 2024-05-19 DIAGNOSIS — E785 Hyperlipidemia, unspecified: Secondary | ICD-10-CM | POA: Diagnosis not present

## 2024-05-19 DIAGNOSIS — Z955 Presence of coronary angioplasty implant and graft: Secondary | ICD-10-CM | POA: Diagnosis not present

## 2024-05-19 DIAGNOSIS — R432 Parageusia: Secondary | ICD-10-CM | POA: Diagnosis not present

## 2024-05-19 DIAGNOSIS — G92 Immune effector cell-associated neurotoxicity syndrome, grade unspecified: Secondary | ICD-10-CM | POA: Diagnosis not present

## 2024-05-19 DIAGNOSIS — Z1152 Encounter for screening for COVID-19: Secondary | ICD-10-CM | POA: Diagnosis not present

## 2024-05-19 DIAGNOSIS — Z79890 Hormone replacement therapy (postmenopausal): Secondary | ICD-10-CM | POA: Diagnosis not present

## 2024-05-19 DIAGNOSIS — I10 Essential (primary) hypertension: Secondary | ICD-10-CM | POA: Diagnosis not present

## 2024-05-19 DIAGNOSIS — I2510 Atherosclerotic heart disease of native coronary artery without angina pectoris: Secondary | ICD-10-CM | POA: Diagnosis not present

## 2024-05-19 DIAGNOSIS — T451X5A Adverse effect of antineoplastic and immunosuppressive drugs, initial encounter: Secondary | ICD-10-CM | POA: Diagnosis not present

## 2024-05-19 DIAGNOSIS — D8481 Immunodeficiency due to conditions classified elsewhere: Secondary | ICD-10-CM | POA: Diagnosis not present

## 2024-05-19 DIAGNOSIS — Z79899 Other long term (current) drug therapy: Secondary | ICD-10-CM | POA: Diagnosis not present

## 2024-05-19 DIAGNOSIS — Z92850 Personal history of chimeric antigen receptor t-cell therapy: Secondary | ICD-10-CM | POA: Diagnosis not present

## 2024-05-19 DIAGNOSIS — R110 Nausea: Secondary | ICD-10-CM | POA: Diagnosis not present

## 2024-05-19 DIAGNOSIS — D61810 Antineoplastic chemotherapy induced pancytopenia: Secondary | ICD-10-CM | POA: Diagnosis not present

## 2024-05-19 DIAGNOSIS — R509 Fever, unspecified: Secondary | ICD-10-CM | POA: Diagnosis not present

## 2024-05-19 DIAGNOSIS — R197 Diarrhea, unspecified: Secondary | ICD-10-CM | POA: Diagnosis not present

## 2024-05-19 DIAGNOSIS — E039 Hypothyroidism, unspecified: Secondary | ICD-10-CM | POA: Diagnosis not present

## 2024-05-20 DIAGNOSIS — D649 Anemia, unspecified: Secondary | ICD-10-CM | POA: Diagnosis not present

## 2024-05-20 DIAGNOSIS — C9000 Multiple myeloma not having achieved remission: Secondary | ICD-10-CM | POA: Diagnosis not present

## 2024-05-20 DIAGNOSIS — R432 Parageusia: Secondary | ICD-10-CM | POA: Diagnosis not present

## 2024-05-20 DIAGNOSIS — Z92850 Personal history of chimeric antigen receptor t-cell therapy: Secondary | ICD-10-CM | POA: Diagnosis not present

## 2024-05-20 DIAGNOSIS — R110 Nausea: Secondary | ICD-10-CM | POA: Diagnosis not present

## 2024-05-25 ENCOUNTER — Encounter: Payer: Self-pay | Admitting: Physician Assistant

## 2024-05-26 ENCOUNTER — Other Ambulatory Visit: Payer: Self-pay | Admitting: Physician Assistant

## 2024-05-26 MED ORDER — ACYCLOVIR 200 MG PO CAPS: 400.0000 mg | ORAL_CAPSULE | Freq: Two times a day (BID) | ORAL | 3 refills | Status: AC

## 2024-05-30 ENCOUNTER — Telehealth: Payer: Self-pay | Admitting: *Deleted

## 2024-05-30 DIAGNOSIS — Z7982 Long term (current) use of aspirin: Secondary | ICD-10-CM | POA: Diagnosis not present

## 2024-05-30 DIAGNOSIS — M545 Low back pain, unspecified: Secondary | ICD-10-CM | POA: Diagnosis not present

## 2024-05-30 DIAGNOSIS — D89831 Cytokine release syndrome, grade 1: Secondary | ICD-10-CM | POA: Diagnosis not present

## 2024-05-30 DIAGNOSIS — I10 Essential (primary) hypertension: Secondary | ICD-10-CM | POA: Diagnosis not present

## 2024-05-30 DIAGNOSIS — C9000 Multiple myeloma not having achieved remission: Secondary | ICD-10-CM | POA: Diagnosis not present

## 2024-05-30 DIAGNOSIS — Z9221 Personal history of antineoplastic chemotherapy: Secondary | ICD-10-CM | POA: Diagnosis not present

## 2024-05-30 DIAGNOSIS — Z66 Do not resuscitate: Secondary | ICD-10-CM | POA: Diagnosis not present

## 2024-05-30 DIAGNOSIS — Z79890 Hormone replacement therapy (postmenopausal): Secondary | ICD-10-CM | POA: Diagnosis not present

## 2024-05-30 DIAGNOSIS — G4700 Insomnia, unspecified: Secondary | ICD-10-CM | POA: Diagnosis not present

## 2024-05-30 DIAGNOSIS — R682 Dry mouth, unspecified: Secondary | ICD-10-CM | POA: Diagnosis not present

## 2024-05-30 DIAGNOSIS — R350 Frequency of micturition: Secondary | ICD-10-CM | POA: Diagnosis not present

## 2024-05-30 DIAGNOSIS — E039 Hypothyroidism, unspecified: Secondary | ICD-10-CM | POA: Diagnosis not present

## 2024-05-30 DIAGNOSIS — E785 Hyperlipidemia, unspecified: Secondary | ICD-10-CM | POA: Diagnosis not present

## 2024-05-30 DIAGNOSIS — C9002 Multiple myeloma in relapse: Secondary | ICD-10-CM | POA: Diagnosis not present

## 2024-05-30 NOTE — Transitions of Care (Post Inpatient/ED Visit) (Signed)
   05/30/2024  Name: Doris Wilkerson MRN: [id number] DOB: 26-Sep-1946  Today's TOC FU Call Status: Today's TOC FU Call Status:: Unsuccessful Call (1st Attempt) Unsuccessful Call (1st Attempt) Date: 05/30/24 (notified of hospital discharge on 05/30/24)  Attempted to reach the patient regarding the most recent Inpatient visit.  Left HIPAA compliant voice message requesting call back  Follow Up Plan: Additional outreach attempts will be made to reach the patient to complete the Transitions of Care (Post Inpatient/ED visit) call.   Pls call/ message for questions,  Faryal Marxen Mckinney Kaevon Cotta, RN, BSN, CCRN Alumnus RN Care Manager  Transitions of Care  [facility] - [facility] [phone]: direct office

## 2024-05-31 ENCOUNTER — Telehealth: Payer: Self-pay | Admitting: *Deleted

## 2024-05-31 ENCOUNTER — Encounter: Payer: Self-pay | Admitting: Family Medicine

## 2024-05-31 NOTE — Telephone Encounter (Signed)
 Patient was sending message to let us  know she was pleased with staff interaction and that she is doing better with her health

## 2024-05-31 NOTE — Transitions of Care (Post Inpatient/ED Visit) (Signed)
 05/31/2024  Name: Doris Wilkerson MRN: [id number] DOB: 08-Dec-1945  Today's TOC FU Call Status: Today's TOC FU Call Status:: Successful TOC FU Call Completed TOC FU Call Complete Date: 05/31/24 Patient's Name and Date of Birth confirmed.  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  I am free of multiple myeloma now) Any questions or concerns?: No  Patient provided additional elaboration on most recent hospital admission: I am at [facility] now receiving periodic inpatient treatment for multiple myeloma; we are staying here for now and have so much support through [facility] it is unreal; I don'Wilkerson need anything from Dr. Levora yet; I sent him a my chart message and updated him yesterday after I got your message; I was only hospitalized this time as a precaution after my recent treatment- not because I was sick. The first hospital visit was for the actual treatment; the second was as a precaution because I started running a fever- which is not unexpected, but they have to put you in the hospital when it happens   Items Reviewed: Did you receive and understand the discharge instructions provided?: Yes (briefly reviewed with patient who verbalizes good understanding of same - outside hospital AVS) Medications obtained,verified, and reconciled?: No Medications Not Reviewed Reasons:: Other: (Patient declined: she is not near her medication list now; reports  self-manages all  medications- reports close follow up with [facility] oncology providers; self-manages medications and denies questions/ concerns around medications today) Any new allergies since your discharge?: No Dietary orders reviewed?: Yes Type of Diet Ordered:: Regular but as conservative as I can, my appetite is a little poor, but it's getting better every day Do you have support at home?: Yes People in Home [RPT]: spouse Name of Support/Comfort Primary Source: Reports independent in self-care activities; currently residing in [place] at hotel: for the treatment, they require us  to stay close by; supportive spouse is with patient and assists as/ if needed/ indicated  Medications Reviewed Today: Medications Reviewed Today     Reviewed by Doris Dinkins M, RN (Registered Nurse) on 05/31/24 at 1429  Med List Status: <None>   Medication Order Taking? Sig Documenting Wilkerson Last Dose Status Informant  acyclovir  (ZOVIRAX ) 200 MG capsule [id number]  Take 2 capsules (400 mg total) by mouth 2 (two) times daily. Doris Wilkerson, Doris T, PA-C  Active   aspirin 81 MG tablet [id number] No Take 81 mg by mouth daily.  Doris Darice CROME, Doris Wilkerson Taking Active            Med Note JOHNICE, ALMARIE A   Fri Jul 19, 2021 10:25 AM)    Blood Glucose Monitoring Suppl DEVI [id number] No 1 each by Does not apply route as needed (symptoms of low blood sugar.). May substitute to any manufacturer covered by patient's insurance.  Patient not taking: Reported on 04/15/2024   Doris Reyes SAUNDERS, Doris Wilkerson Not Taking Active   carfilzomib  (KYPROLIS ) 10 MG SOLR [id number] No Inject 70 mg into the vein once a week.  Patient not taking: Reported on 04/15/2024  Wilkerson, Historical, Doris Wilkerson Not Taking Active            Med Note MARLAND BEATRIS HERO   Fri Apr 15, 2024  1:06 PM) 04/15/24- Reports during TOC call- no longer on this treatment  cholecalciferol (VITAMIN D3) 25 MCG (1000 UNIT) tablet [id number] No Take 2,000 Units by mouth daily.  Wilkerson, Historical, Doris Wilkerson Taking Active   Continuous Glucose Sensor (FREESTYLE LIBRE 3 SENSOR) [place] [id number] No 1 Device by Does not apply route continuous. Place 1 sensor on the skin every 14 days. Use to check glucose continuously  Patient not taking: Reported on 04/15/2024   Doris Ernst, Doris Wilkerson Not Taking Active   dexamethasone  (DECADRON ) 0.5 MG/5ML solution [id number]  Swish and spit (5 mL) by mouth 4 times a day as needed for dry mouth   Active   famotidine (PEPCID) 20 MG tablet [id number] No Take 20 mg by mouth daily. Wilkerson, Historical, Doris Wilkerson Taking Active   gabapentin  (NEURONTIN ) 100 MG capsule [id number] No TAKE 1 CAPSULE(100 MG) BY MOUTH TWICE DAILY AS NEEDED Doris Reyes SAUNDERS, Doris Wilkerson Taking Active            Med Note (Doris Wilkerson   Fri Apr 15, 2024  1:00 PM) 04/15/24- Reports during [facility] call- this dose was increased to TID by Doris Wilkerson post- hospital discharge on 04/14/24- (IP planned hospital admission for chemotherapy treatment)   levothyroxine  (SYNTHROID ) 25 MCG tablet [id number] No TAKE 1 TABLET(25 MCG) BY MOUTH DAILY BEFORE BREAKFAST Doris Wilkerson, Doris Wilkerson, Doris Wilkerson Taking Active   lidocaine -prilocaine  (EMLA ) cream [id number] No Apply 1 Application topically as needed. Doris Wilkerson, Doris T, PA-C Taking Active            Med Note (Doris Wilkerson   Fri Apr 15, 2024  1:05 PM) 04/15/24- Reports during TOC call- has not needed recently  Magnesium 100 MG TABS [id number] No Take 100 mg by mouth daily. Wilkerson, Historical, Doris Wilkerson Taking Active   nitroGLYCERIN  (NITROSTAT ) 0.4 MG SL tablet [id number] No Place 1 tablet (0.4 mg total) under the tongue every 5 (five) minutes as needed for chest pain. Doris Heinz, Doris Wilkerson Taking Expired 12/30/23 2359            Med Note (Doris Wilkerson, Doris Wilkerson   Wed Sep 09, 2023  8:05 AM) Never had to use  oxycodone (OXY-IR) 5 MG capsule [id number] No Take 5 mg by mouth every 4 (four) hours as needed for pain. 04/15/24- Reports during TOC call- this was prescribed at time of recent  hospital discharge for IP planned chemotherapy treatment: patient reports using very little; no pain Doris Reyes SAUNDERS, Doris Wilkerson Taking Active Self           Med Note (Jailin Manocchio Wilkerson   Fri Apr 15, 2024  1:09 PM) 04/15/24- Reports during [facility] call- this was prescribed at time of recent hospital discharge for IP planned chemotherapy treatment: patient reports using very little; no pain   polyethylene glycol (MIRALAX / GLYCOLAX) 17 g packet [id number] No Take 17 g by mouth daily as needed. Wilkerson, Historical, Doris Wilkerson Taking Active   traMADol  (ULTRAM ) 50 MG tablet [id number] No Take 1 tablet (50 mg total) by mouth every 6 (six) hours as needed. Doris Norleen ONEIDA MADISON, Doris Wilkerson Taking Active            Med Note (Dakin Madani Wilkerson   Fri Apr 15, 2024  1:05 PM) 04/15/24- Reports during [facility] call- has not needed recently   Med List Note Carmella, [place] FALCON, [facility]  03/05/23 1318): Pomalyst  filled by [employer] by Arnell Wilkerson-scribe refills           Home Care and Equipment/Supplies: Were Home Health Services Ordered?: No Any new equipment or medical supplies ordered?: No  Functional Questionnaire: Do you need assistance with bathing/showering or dressing?: No Do you need assistance with meal preparation?: No Do you need assistance with eating?: No Do you have difficulty maintaining continence: No Do you need assistance with getting out of bed/getting out of a chair/moving?: No Do you have difficulty managing or taking your medications?: No  Follow up appointments reviewed: PCP Follow-up appointment confirmed?: NA (verified PCP HFU office visit not indicated per discharging hospital Wilkerson note post- recent discharge: Reports she contacted PCP by My-Chart post-discharge to update him: He understands he is not supposed to see me until I get back to [facility] Follow-up appointment confirmed?: Yes Date of Specialist follow-up appointment?: 05/30/24 Follow-Up Specialty Wilkerson:: [facility] oncology  Wilkerson- verified attended hospital follow up office visit on 05/30/24; has next appointment scheduled for 06/06/24 Do you need transportation to your follow-up appointment?: No Do you understand care options if your condition(s) worsen?: Yes-patient verbalized understanding  SDOH Interventions Today    Flowsheet Row Most Recent Value  SDOH Interventions   Food Insecurity Interventions Intervention Not Indicated  Housing Interventions Intervention Not Indicated  Transportation Interventions Intervention Not Indicated  [husband continues to provide transportation while patient is receiving periodic inpatient treatment at [facility] for multiple myeloma- currently driving restricted]  Utilities Interventions Intervention Not Indicated   Patient declines need for ongoing/ further care management/ coordination outreach; declines enrollment in 30-day TOC program- declines taking my direct phone number should needs/ concerns arise post-TOC call   See TOC assessment tabs for additional assessment/ TOC intervention information  Pls call/ message for questions,  Lekia Nier Mckinney Hady Niemczyk, RN, BSN, CCRN Alumnus RN Care Manager  Transitions of Care  [facility] - [facility] [phone]: direct office

## 2024-06-02 DIAGNOSIS — Z92850 Personal history of chimeric antigen receptor t-cell therapy: Secondary | ICD-10-CM | POA: Diagnosis not present

## 2024-06-02 DIAGNOSIS — C9000 Multiple myeloma not having achieved remission: Secondary | ICD-10-CM | POA: Diagnosis not present

## 2024-06-02 DIAGNOSIS — Z08 Encounter for follow-up examination after completed treatment for malignant neoplasm: Secondary | ICD-10-CM | POA: Diagnosis not present

## 2024-06-06 DIAGNOSIS — Z92850 Personal history of chimeric antigen receptor t-cell therapy: Secondary | ICD-10-CM | POA: Diagnosis not present

## 2024-06-06 DIAGNOSIS — C9000 Multiple myeloma not having achieved remission: Secondary | ICD-10-CM | POA: Diagnosis not present

## 2024-06-09 DIAGNOSIS — Z7982 Long term (current) use of aspirin: Secondary | ICD-10-CM | POA: Diagnosis not present

## 2024-06-09 DIAGNOSIS — E785 Hyperlipidemia, unspecified: Secondary | ICD-10-CM | POA: Diagnosis not present

## 2024-06-09 DIAGNOSIS — D801 Nonfamilial hypogammaglobulinemia: Secondary | ICD-10-CM | POA: Diagnosis not present

## 2024-06-09 DIAGNOSIS — I10 Essential (primary) hypertension: Secondary | ICD-10-CM | POA: Diagnosis not present

## 2024-06-09 DIAGNOSIS — Z66 Do not resuscitate: Secondary | ICD-10-CM | POA: Diagnosis not present

## 2024-06-09 DIAGNOSIS — E039 Hypothyroidism, unspecified: Secondary | ICD-10-CM | POA: Diagnosis not present

## 2024-06-09 DIAGNOSIS — Z79899 Other long term (current) drug therapy: Secondary | ICD-10-CM | POA: Diagnosis not present

## 2024-06-09 DIAGNOSIS — C9000 Multiple myeloma not having achieved remission: Secondary | ICD-10-CM | POA: Diagnosis not present

## 2024-06-09 DIAGNOSIS — Z79890 Hormone replacement therapy (postmenopausal): Secondary | ICD-10-CM | POA: Diagnosis not present

## 2024-06-09 DIAGNOSIS — Z92850 Personal history of chimeric antigen receptor t-cell therapy: Secondary | ICD-10-CM | POA: Diagnosis not present

## 2024-06-09 DIAGNOSIS — Z792 Long term (current) use of antibiotics: Secondary | ICD-10-CM | POA: Diagnosis not present

## 2024-06-11 ENCOUNTER — Other Ambulatory Visit: Payer: Self-pay | Admitting: Family Medicine

## 2024-06-11 DIAGNOSIS — E782 Mixed hyperlipidemia: Secondary | ICD-10-CM

## 2024-06-13 DIAGNOSIS — Z91048 Other nonmedicinal substance allergy status: Secondary | ICD-10-CM | POA: Diagnosis not present

## 2024-06-13 DIAGNOSIS — Z66 Do not resuscitate: Secondary | ICD-10-CM | POA: Diagnosis not present

## 2024-06-13 DIAGNOSIS — Z888 Allergy status to other drugs, medicaments and biological substances status: Secondary | ICD-10-CM | POA: Diagnosis not present

## 2024-06-13 DIAGNOSIS — C9000 Multiple myeloma not having achieved remission: Secondary | ICD-10-CM | POA: Diagnosis not present

## 2024-06-13 DIAGNOSIS — Z79890 Hormone replacement therapy (postmenopausal): Secondary | ICD-10-CM | POA: Diagnosis not present

## 2024-06-14 ENCOUNTER — Encounter: Payer: Self-pay | Admitting: Hematology and Oncology

## 2024-06-14 DIAGNOSIS — C9000 Multiple myeloma not having achieved remission: Secondary | ICD-10-CM | POA: Diagnosis not present

## 2024-06-29 ENCOUNTER — Other Ambulatory Visit: Payer: Self-pay

## 2024-06-30 ENCOUNTER — Ambulatory Visit (INDEPENDENT_AMBULATORY_CARE_PROVIDER_SITE_OTHER): Admitting: Family Medicine

## 2024-06-30 ENCOUNTER — Other Ambulatory Visit (HOSPITAL_COMMUNITY): Payer: Self-pay

## 2024-06-30 ENCOUNTER — Encounter: Payer: Self-pay | Admitting: Hematology and Oncology

## 2024-06-30 ENCOUNTER — Encounter: Payer: Self-pay | Admitting: Family Medicine

## 2024-06-30 ENCOUNTER — Ambulatory Visit: Payer: Self-pay | Admitting: Family Medicine

## 2024-06-30 VITALS — BP 94/62 | HR 99 | Temp 99.0°F | Ht 66.0 in | Wt 131.6 lb

## 2024-06-30 DIAGNOSIS — R7303 Prediabetes: Secondary | ICD-10-CM

## 2024-06-30 DIAGNOSIS — C9000 Multiple myeloma not having achieved remission: Secondary | ICD-10-CM | POA: Diagnosis not present

## 2024-06-30 DIAGNOSIS — R238 Other skin changes: Secondary | ICD-10-CM | POA: Diagnosis not present

## 2024-06-30 DIAGNOSIS — R42 Dizziness and giddiness: Secondary | ICD-10-CM

## 2024-06-30 DIAGNOSIS — E039 Hypothyroidism, unspecified: Secondary | ICD-10-CM | POA: Diagnosis not present

## 2024-06-30 DIAGNOSIS — E782 Mixed hyperlipidemia: Secondary | ICD-10-CM | POA: Diagnosis not present

## 2024-06-30 DIAGNOSIS — I959 Hypotension, unspecified: Secondary | ICD-10-CM | POA: Diagnosis not present

## 2024-06-30 LAB — COMPREHENSIVE METABOLIC PANEL WITH GFR
ALT: 26 U/L (ref 0–35)
AST: 35 U/L (ref 0–37)
Albumin: 4.6 g/dL (ref 3.5–5.2)
Alkaline Phosphatase: 44 U/L (ref 39–117)
BUN: 13 mg/dL (ref 6–23)
CO2: 26 meq/L (ref 19–32)
Calcium: 10.2 mg/dL (ref 8.4–10.5)
Chloride: 105 meq/L (ref 96–112)
Creatinine, Ser: 0.71 mg/dL (ref 0.40–1.20)
GFR: 81.71 mL/min (ref >60.00)
Glucose, Bld: 92 mg/dL (ref 70–99)
Potassium: 4.1 meq/L (ref 3.5–5.1)
Sodium: 141 meq/L (ref 135–145)
Total Bilirubin: 0.7 mg/dL (ref 0.2–1.2)
Total Protein: 6 g/dL (ref 6.0–8.3)

## 2024-06-30 LAB — CBC
HCT: 37.9 % (ref 36.0–46.0)
Hemoglobin: 12.3 g/dL (ref 12.0–15.0)
MCHC: 32.4 g/dL (ref 30.0–36.0)
MCV: 81 fl (ref 78.0–100.0)
Platelets: 272 K/uL (ref 150.0–400.0)
RBC: 4.67 Mil/uL (ref 3.87–5.11)
RDW: 15.1 % (ref 11.5–15.5)
WBC: 6.1 K/uL (ref 4.0–10.5)

## 2024-06-30 LAB — LIPID PANEL
Cholesterol: 117 mg/dL (ref 0–200)
HDL: 64.4 mg/dL (ref >39.00)
LDL Cholesterol: 41 mg/dL (ref 0–99)
NonHDL: 52.94
Total CHOL/HDL Ratio: 2
Triglycerides: 58 mg/dL (ref 0.0–149.0)
VLDL: 11.6 mg/dL (ref 0.0–40.0)

## 2024-06-30 LAB — TSH: TSH: 1.45 u[IU]/mL (ref 0.35–5.50)

## 2024-06-30 MED ORDER — AMLODIPINE BESYLATE 2.5 MG PO TABS: 2.5000 mg | ORAL_TABLET | Freq: Every day | ORAL | 1 refills | Status: DC

## 2024-06-30 MED ORDER — AMLODIPINE BESYLATE 2.5 MG PO TABS
2.5000 mg | ORAL_TABLET | Freq: Every day | ORAL | 1 refills | Status: DC
  Filled 2024-06-30 (×2): qty 30, 30d supply, fill #0

## 2024-06-30 NOTE — Patient Instructions (Addendum)
 If persistent skin concerns or any new rash, discuss right away with your transplant specialist.  Blood pressure is a bit low today. Decrease amlodipine  to 2.5mg  per day for now. If blood pressure consistently above 140/90 - return to 5mg  and let me know. Continue to drink plenty of fluids.  If any persistent lightheadedness at the 2.5mg  dose, stop it altogether for now. I will check some labs and we can discuss those at your follow-up visit in 2 weeks, but please see me sooner if any new or worsening symptoms.  Take care!

## 2024-06-30 NOTE — Progress Notes (Signed)
 Subjective:  Patient ID: Doris Wilkerson, female    DOB: [birth year]  Age: 78 y.o. MRN: [id number]  CC:  Chief Complaint  Patient presents with   Hypertension    Blood pressure - benazepril  prescription, which was discontinued by [facility] during targeted treatment for multiple myeloma. My provider at [facility] says that I may resume taking benazepril  with Dr. Valorie agreement.  Patient said she is taking amlodipine  but unsure of dosage. I did not add to medication list because she doesn't know.     Skin issue    Patient also stated she is having prickly skin. Worse in the sun.     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  Under care of Dr. Homer at [facility].  Hospitalized for a few days with mild CRS fever.  See MyChart message on July 8.  Atorvastatin , amlodipine  and benazepril  were held temporarily.  Home since 7/23. Initial week with fatigue, better recently but variable blood pressures. Home readings in the morning on her device 130-159/77-85 in the morning. No daytime readings. No low readings at home. Has been told that she may have low blood pressure with Car-T treatment.  For blood pressure, currently taking amlodipine  5mg  (since discharge on 7/23). Benazepril  still on hold.  Appt with cardiologist in October.  Her monitor read 115/73 in office today after reading of 94/62 on our monitor today.  Feeling lightheaded at times during the day - notes with walking dog. Sometimes with standing up.  No diarrhea, drinking fluids. Ensure and premier protein for nutrition with some solid foods increasing. Juices, gatorade.  No chest pains/palpitations.  Has follow up in 2 weeks with me as well.  On new manufacturer for thyroid  med past few weeks.  Lab Results  Component Value Date   HGBA1C 4.6 01/07/2024   Lab Results  Component Value Date   TSH 1.48 01/07/2024   Lab Results  Component Value  Date   CHOL 175 01/07/2024   HDL 89.20 01/07/2024   LDLCALC 55 01/07/2024   TRIG 154.0 (H) 01/07/2024   CHOLHDL 2 01/07/2024    BP Readings from Last 3 Encounters:  06/30/24 94/62  03/11/24 133/83  03/04/24 (!) 149/80   Lab Results  Component Value Date   CREATININE 0.63 03/11/2024   Skin concern Prickly skin when going outside in heat after treatment. Messaged her specialist at [facility]. No actual rash. Not feeling with cooler weather.  Better recently.    History Patient Active Problem List   Diagnosis Date Noted   Abnormal finding of blood chemistry, unspecified 03/31/2024   Port-A-Cath in place 09/11/2023   Mixed hyperlipidemia 01/11/2023   Essential hypertension 01/11/2023   Physical debility 03/21/2021   Dizziness 03/21/2021   Vertigo 03/21/2021   Anemia due to antineoplastic chemotherapy 03/21/2021   Skin changes related to chemotherapy 03/21/2021   Other constipation 03/21/2021   Multiple myeloma not having achieved remission (HCC) 01/27/2021   Tear of medial meniscus of knee 10/12/2019   Pain in right knee 09/30/2019   Hordeolum externum of left lower eyelid 06/28/2018   CAD (coronary artery disease) 12/09/2011   Dyslipidemia 12/09/2011   Osteopenia 12/09/2011   Hypothyroid 12/09/2011   Asthma 12/09/2011   Past Medical History:  Diagnosis Date   Asthma    Cataract    GERD (gastroesophageal reflux disease)    Hyperlipidemia    Hypertension    Thyroid  disease  Past Surgical History:  Procedure Laterality Date   Cataract Surgery     CORONARY ANGIOPLASTY WITH STENT PLACEMENT     EYE SURGERY     IR IMAGING GUIDED PORT INSERTION  09/09/2023   ORTHOPEDIC SURGERY  2012   TONSILLECTOMY  1951   Allergies  Allergen Reactions   Poison Ivy Extract Swelling   Plavix [Clopidogrel Bisulfate]    Wound Dressing Adhesive    Other     seasonal   Prior to Admission medications   Medication Sig Start Date End Date Taking? Authorizing Provider  acyclovir   (ZOVIRAX ) 200 MG capsule Take 2 capsules (400 mg total) by mouth 2 (two) times daily. 05/26/24  Yes Thayil, Irene T, PA-C  aspirin 81 MG tablet Take 81 mg by mouth daily.    Yes Burney Darice CROME, MD  atorvastatin  (LIPITOR) 80 MG tablet Take 80 mg by mouth daily. 06/16/24  Yes [provider]  cholecalciferol (VITAMIN D3) 25 MCG (1000 UNIT) tablet Take 2,000 Units by mouth daily.   Yes [provider]  dexamethasone  (DECADRON ) 0.5 MG/5ML solution Swish and spit (5 mL) by mouth 4 times a day as needed for dry mouth 04/25/24  Yes   famotidine (PEPCID) 20 MG tablet Take 20 mg by mouth daily.   Yes [provider]  gabapentin  (NEURONTIN ) 100 MG capsule TAKE 1 CAPSULE(100 MG) BY MOUTH TWICE DAILY AS NEEDED 04/08/24  Yes Levora Reyes SAUNDERS, MD  levothyroxine  (SYNTHROID ) 25 MCG tablet TAKE 1 TABLET(25 MCG) BY MOUTH DAILY BEFORE BREAKFAST 09/15/23  Yes Tabori, Katherine E, MD  lidocaine -prilocaine  (EMLA ) cream Apply 1 Application topically as needed. 09/11/23  Yes Thayil, Irene T, PA-C  Magnesium 100 MG TABS Take 100 mg by mouth daily.   Yes [provider]  melatonin 1 MG TABS tablet Take 1 mg by mouth at bedtime.   Yes [provider]  nitroGLYCERIN  (NITROSTAT ) 0.4 MG SL tablet Place 1 tablet (0.4 mg total) under the tongue every 5 (five) minutes as needed for chest pain. 07/13/23 06/30/24 Yes Ladona Heinz, MD  polyethylene glycol (MIRALAX / GLYCOLAX) 17 g packet Take 17 g by mouth daily as needed.   Yes [provider]  SF 5000 PLUS 1.1 % CREA dental cream Take 1 Application by mouth. 04/06/24  Yes [provider]  sulfamethoxazole -trimethoprim  (BACTRIM  DS) 800-160 MG tablet Take 1 tablet by mouth. Every Monday, Wednesday and Friday 06/13/24  Yes [provider]  albuterol (VENTOLIN HFA) 108 (90 Base) MCG/ACT inhaler Inhale 1-2 puffs into the lungs. Patient not taking: Reported on 06/30/2024 04/06/24   [provider]  benazepril  (LOTENSIN )  10 MG tablet Take 10 mg by mouth daily. Patient not taking: Reported on 06/30/2024 06/24/24   [provider]  Blood Glucose Monitoring Suppl DEVI 1 each by Does not apply route as needed (symptoms of low blood sugar.). May substitute to any manufacturer covered by patient's insurance. Patient not taking: Reported on 06/30/2024 05/11/23   Levora Reyes SAUNDERS, MD  carfilzomib  (KYPROLIS ) 10 MG SOLR Inject 70 mg into the vein once a week. Patient not taking: Reported on 04/15/2024    [provider]  Continuous Glucose Sensor (FREESTYLE LIBRE 3 SENSOR) MISC 1 Device by Does not apply route continuous. Place 1 sensor on the skin every 14 days. Use to check glucose continuously Patient not taking: Reported on 06/30/2024 10/05/23   Motwani, Komal, MD  loratadine (CLARITIN) 10 MG tablet Take 10 mg by mouth daily. Patient not taking: Reported  on 06/30/2024 05/26/24   [provider]  oxycodone (OXY-IR) 5 MG capsule Take 5 mg by mouth every 4 (four) hours as needed for pain. 04/15/24- Reports during TOC call- this was prescribed at time of recent hospital discharge for IP planned chemotherapy treatment: patient reports using very little; no pain Patient not taking: Reported on 06/30/2024    Levora Reyes SAUNDERS, MD  prochlorperazine  (COMPAZINE ) 10 MG tablet Take 10 mg by mouth every 6 (six) hours as needed for nausea. Patient not taking: Reported on 06/30/2024 05/10/24   [provider]  traMADol  (ULTRAM ) 50 MG tablet Take 1 tablet (50 mg total) by mouth every 6 (six) hours as needed. Patient not taking: Reported on 06/30/2024 02/12/24   Federico Norleen ONEIDA MADISON, MD  triamcinolone  cream (KENALOG ) 0.1 % Apply 1 Application topically 3 (three) times daily. Patient not taking: Reported on 06/30/2024 04/19/24   [provider]  triamcinolone  cream (KENALOG ) 0.5 % Apply 1 Application topically 2 (two) times daily. Patient not taking: Reported on 06/30/2024 03/25/24   [provider]   Social  History   Socioeconomic History   Marital status: Married    Spouse name: Not on file   Number of children: 0   Years of education: Not on file   Highest education level: Master's degree (e.g., MA, MS, MEng, MEd, MSW, MBA)  Occupational History   Occupation: unemployed  Tobacco Use   Smoking status: Never   Smokeless tobacco: Never  Vaping Use   Vaping status: Never Used  Substance and Sexual Activity   Alcohol use: Yes    Alcohol/week: 1.0 standard drink of alcohol    Types: 1 Glasses of wine per week    Comment: occassionally   Drug use: No   Sexual activity: Not Currently  Other Topics Concern   Not on file  Social History Narrative   Married. Education: college. Pt does exercise-   Social Drivers of Health   Financial Resource Strain: Low Risk  (06/29/2024)   Overall Financial Resource Strain (CARDIA)    Difficulty of Paying Living Expenses: Not hard at all  Food Insecurity: No Food Insecurity (06/29/2024)   Hunger Vital Sign    Worried About Running Out of Food in the Last Year: Never true    Ran Out of Food in the Last Year: Never true  Transportation Needs: No Transportation Needs (06/29/2024)   PRAPARE - Administrator, Civil Service (Medical): No    Lack of Transportation (Non-Medical): No  Physical Activity: Unknown (01/04/2024)   Exercise Vital Sign    Days of Exercise per Week: 0 days    Minutes of Exercise per Session: Not on file  Recent Concern: Physical Activity - Inactive (01/04/2024)   Exercise Vital Sign    Days of Exercise per Week: 0 days    Minutes of Exercise per Session: 20 min  Stress: No Stress Concern Present (06/29/2024)   [employer] of Occupational Health - Occupational Stress Questionnaire    Feeling of Stress: Not at all  Social Connections: Unknown (06/29/2024)   Social Connection and Isolation Panel    Frequency of Communication with Friends and Family: Twice a week    Frequency of Social Gatherings with Friends and Family:  Patient declined    Attends Religious Services: Never    Database administrator or Organizations: Yes    Attends Banker Meetings: 1 to 4 times per year    Marital Status: Married  Catering manager  Violence: Not At Risk (05/31/2024)   Humiliation, Afraid, Rape, and Kick questionnaire    Fear of Current or Ex-Partner: No    Emotionally Abused: No    Physically Abused: No    Sexually Abused: No    Review of Systems   Objective:   Vitals:   06/30/24 0818  BP: 94/62  Pulse: 99  Temp: 99 F (37.2 C)  TempSrc: Temporal  SpO2: 99%  Weight: 131 lb 9.6 oz (59.7 kg)  Height: 5' 6 (1.676 m)     Physical Exam Vitals reviewed.  Constitutional:      General: She is not in acute distress.    Appearance: Normal appearance. She is well-developed. She is not ill-appearing, toxic-appearing or diaphoretic.  HENT:     Head: Normocephalic and atraumatic.  Eyes:     Conjunctiva/sclera: Conjunctivae normal.     Pupils: Pupils are equal, round, and reactive to light.  Neck:     Vascular: No carotid bruit.  Cardiovascular:     Rate and Rhythm: Normal rate and regular rhythm.     Heart sounds: Normal heart sounds.     Comments: Heart rate approximately 80-90.  No ectopy. Pulmonary:     Effort: Pulmonary effort is normal.     Breath sounds: Normal breath sounds.  Abdominal:     Palpations: Abdomen is soft. There is no pulsatile mass.     Tenderness: There is no abdominal tenderness.  Musculoskeletal:     Right lower leg: No edema.     Left lower leg: No edema.  Skin:    General: Skin is warm and dry.     Comments: No rash or erythema appreciated.  Neurological:     Mental Status: She is alert and oriented to person, place, and time.  Psychiatric:        Mood and Affect: Mood normal.        Behavior: Behavior normal.        Assessment & Plan:  Daielle Melcher is a 78 y.o. female . Hypotension, unspecified hypotension type - Plan: amLODipine  (NORVASC ) 2.5 MG  tablet, DISCONTINUED: amLODipine  (NORVASC ) 2.5 MG tablet  - History of hypertension, prior on ACE inhibitor as well as higher dose of amlodipine , now on lower dose amlodipine  5 mg with some hypotension as above.  Asymptomatic in office.  Will try lower dose of amlodipine  at 2.5 mg for now, continue to hold ACE inhibitor, monitor home blood pressure readings (discussed discrepancy with her home meter versus in office testing).  If continued lightheadedness or lower blood pressures on 2.5 mg amlodipine , stop that altogether.  If higher readings, may need to return to 5 mg.  Close follow-up in next few weeks with RTC precautions given.  Multiple myeloma, remission status unspecified (HCC)  - Status posttreatment as above, followed by transplant, specialist., fatigue improving.  Hyperlipemia, mixed - Plan: Lipid panel  - Check labs and adjust plan accordingly, will discuss further at her follow-up visit regarding chronic medications.  Off statin with treatment above.  Prediabetes - Plan: Comprehensive metabolic panel with GFR  - History of prediabetes, most recent A1c looked okay, check CMP.  Hypothyroidism, unspecified type - Plan: TSH  - Different formulation/generic on most recent refill, check updated TSH for stability.  Skin irritation  - No rash, asymptomatic at present, noted with heat outside.  Continue to monitor at this time, and discussed with her transplant specialist to see if that may be related to her recent treatment, or consider  Derm eval if any new rash.  Close follow-up as above.  Episodic lightheadedness - Plan: Comprehensive metabolic panel with GFR, CBC  - As above, check labs, decreased amlodipine  dose with option to stop if persistent low readings, recheck next few weeks.  Meds ordered this encounter  Medications   DISCONTD: amLODipine  (NORVASC ) 2.5 MG tablet    Sig: Take 1 tablet (2.5 mg total) by mouth daily.    Dispense:  90 tablet    Refill:  1   amLODipine   (NORVASC ) 2.5 MG tablet    Sig: Take 1 tablet (2.5 mg total) by mouth daily.    Dispense:  30 tablet    Refill:  1   Patient Instructions  If persistent skin concerns or any new rash, discuss right away with your transplant specialist.  Blood pressure is a bit low today. Decrease amlodipine  to 2.5mg  per day for now. If blood pressure consistently above 140/90 - return to 5mg  and let me know. Continue to drink plenty of fluids.  If any persistent lightheadedness at the 2.5mg  dose, stop it altogether for now. I will check some labs and we can discuss those at your follow-up visit in 2 weeks, but please see me sooner if any new or worsening symptoms.  Take care!     Signed,   Reyes Pines, MD [facility], [facility] 06/30/24 9:11 AM

## 2024-07-02 ENCOUNTER — Encounter: Payer: Self-pay | Admitting: Hematology and Oncology

## 2024-07-12 DIAGNOSIS — C9000 Multiple myeloma not having achieved remission: Secondary | ICD-10-CM | POA: Diagnosis not present

## 2024-07-14 ENCOUNTER — Ambulatory Visit (INDEPENDENT_AMBULATORY_CARE_PROVIDER_SITE_OTHER): Payer: Medicare Other | Admitting: Family Medicine

## 2024-07-14 ENCOUNTER — Encounter: Payer: Self-pay | Admitting: Family Medicine

## 2024-07-14 ENCOUNTER — Other Ambulatory Visit (HOSPITAL_COMMUNITY): Payer: Self-pay

## 2024-07-14 VITALS — BP 116/64 | HR 75 | Temp 98.7°F | Resp 15 | Ht 66.0 in | Wt 132.4 lb

## 2024-07-14 DIAGNOSIS — R21 Rash and other nonspecific skin eruption: Secondary | ICD-10-CM

## 2024-07-14 DIAGNOSIS — I959 Hypotension, unspecified: Secondary | ICD-10-CM

## 2024-07-14 MED ORDER — AMLODIPINE BESYLATE 2.5 MG PO TABS
2.5000 mg | ORAL_TABLET | Freq: Every day | ORAL | 3 refills | Status: DC
  Filled 2024-07-14 – 2024-07-26 (×2): qty 90, 90d supply, fill #0

## 2024-07-14 NOTE — Progress Notes (Signed)
 Subjective:  Patient ID: Doris Wilkerson, female    DOB: 05/27/1946  Age: 78 y.o. MRN: [id number]  CC:  Chief Complaint  Patient presents with   Follow-up    Medication check and labs. Thinks blood pressure medication is working.    Rash    Side of left leg. Red. Somewhat raised. Does not itch or hurt. Was gardening and did not notice it until yesterday    HPI Doris Wilkerson presents for    Hypotension, lightheadedness History of hypertension, prior ACE inhibitor as well as higher dose of amlodipine  but after her treatment was able to treat blood pressure with just amlodipine , 5 mg with intermittent lower blood pressures discussed at her August 7 visit.  Asymptomatic in office.  Decided to continue to hold on ACE inhibitor, decreased amlodipine  to 2.5 mg with option to stop altogether if persistent lows.  Overall reassuring labs as below.  Since last visit feels better. Tolerating 2.5mg  dose.  Has cardiology appt 10/7.  No home readings. No dizzy episodes.  BP Readings from Last 3 Encounters:  07/14/24 116/64  06/30/24 94/62  03/11/24 133/83   Left leg rash Red area noted last night. Wore compression socks. Noticed red spot later last night. Not itchy, no pain. No symptoms.  Tx: triamcinolone  last night and this morning - no change.    Results for orders placed or performed in visit on 06/30/24  Comprehensive metabolic panel with GFR   Collection Time: 06/30/24  9:17 AM  Result Value Ref Range   Sodium 141 135 - 145 mEq/L   Potassium 4.1 3.5 - 5.1 mEq/L   Chloride 105 96 - 112 mEq/L   CO2 26 19 - 32 mEq/L   Glucose, Bld 92 70 - 99 mg/dL   BUN 13 6 - 23 mg/dL   Creatinine, Ser 9.28 0.40 - 1.20 mg/dL   Total Bilirubin 0.7 0.2 - 1.2 mg/dL   Alkaline Phosphatase 44 39 - 117 U/L   AST 35 0 - 37 U/L   ALT 26 0 - 35 U/L   Total Protein 6.0 6.0 - 8.3 g/dL   Albumin 4.6 3.5 - 5.2 g/dL   GFR 18.28 >39.99 mL/min   Calcium  10.2 8.4 - 10.5 mg/dL  Lipid panel   Collection  Time: 06/30/24  9:17 AM  Result Value Ref Range   Cholesterol 117 0 - 200 mg/dL   Triglycerides 41.9 0.0 - 149.0 mg/dL   HDL 35.59 >60.99 mg/dL   VLDL 88.3 0.0 - 59.9 mg/dL   LDL Cholesterol 41 0 - 99 mg/dL   Total CHOL/HDL Ratio 2    NonHDL 52.94   CBC   Collection Time: 06/30/24  9:17 AM  Result Value Ref Range   WBC 6.1 4.0 - 10.5 K/uL   RBC 4.67 3.87 - 5.11 Mil/uL   Platelets 272.0 150.0 - 400.0 K/uL   Hemoglobin 12.3 12.0 - 15.0 g/dL   HCT 62.0 63.9 - 53.9 %   MCV 81.0 78.0 - 100.0 fl   MCHC 32.4 30.0 - 36.0 g/dL   RDW 84.8 88.4 - 84.4 %  TSH   Collection Time: 06/30/24  9:17 AM  Result Value Ref Range   TSH 1.45 0.35 - 5.50 uIU/mL   *Note: Due to a large number of results and/or encounters for the requested time period, some results have not been displayed. A complete set of results can be found in Results Review.     History Patient Active Problem List  Diagnosis Date Noted   Abnormal finding of blood chemistry, unspecified 03/31/2024   Port-A-Cath in place 09/11/2023   Mixed hyperlipidemia 01/11/2023   Essential hypertension 01/11/2023   Physical debility 03/21/2021   Dizziness 03/21/2021   Vertigo 03/21/2021   Anemia due to antineoplastic chemotherapy 03/21/2021   Skin changes related to chemotherapy 03/21/2021   Other constipation 03/21/2021   Multiple myeloma not having achieved remission (HCC) 01/27/2021   Tear of medial meniscus of knee 10/12/2019   Pain in right knee 09/30/2019   Hordeolum externum of left lower eyelid 06/28/2018   CAD (coronary artery disease) 12/09/2011   Dyslipidemia 12/09/2011   Osteopenia 12/09/2011   Hypothyroid 12/09/2011   Asthma 12/09/2011   Past Medical History:  Diagnosis Date   Asthma    Cataract    GERD (gastroesophageal reflux disease)    Hyperlipidemia    Hypertension    Thyroid  disease    Past Surgical History:  Procedure Laterality Date   Cataract Surgery     CORONARY ANGIOPLASTY WITH STENT PLACEMENT      EYE SURGERY     IR IMAGING GUIDED PORT INSERTION  09/09/2023   ORTHOPEDIC SURGERY  2012   TONSILLECTOMY  1951   Allergies  Allergen Reactions   Poison Ivy Extract Swelling   Plavix [Clopidogrel Bisulfate]    Wound Dressing Adhesive    Other     seasonal   Prior to Admission medications   Medication Sig Start Date End Date Taking? Authorizing Provider  acyclovir  (ZOVIRAX ) 200 MG capsule Take 2 capsules (400 mg total) by mouth 2 (two) times daily. 05/26/24  Yes Thayil, Irene T, PA-C  amLODipine  (NORVASC ) 2.5 MG tablet Take 1 tablet (2.5 mg total) by mouth daily. 06/30/24  Yes Levora Reyes SAUNDERS, MD  aspirin 81 MG tablet Take 81 mg by mouth daily.    Yes Burney Darice CROME, MD  atorvastatin  (LIPITOR) 80 MG tablet Take 80 mg by mouth daily. 06/16/24  Yes [provider]  cholecalciferol (VITAMIN D3) 25 MCG (1000 UNIT) tablet Take 2,000 Units by mouth daily.   Yes [provider]  famotidine (PEPCID) 20 MG tablet Take 20 mg by mouth daily.   Yes [provider]  gabapentin  (NEURONTIN ) 100 MG capsule TAKE 1 CAPSULE(100 MG) BY MOUTH TWICE DAILY AS NEEDED 04/08/24  Yes Levora Reyes SAUNDERS, MD  levothyroxine  (SYNTHROID ) 25 MCG tablet TAKE 1 TABLET(25 MCG) BY MOUTH DAILY BEFORE BREAKFAST 09/15/23  Yes Tabori, Katherine E, MD  lidocaine -prilocaine  (EMLA ) cream Apply 1 Application topically as needed. 09/11/23  Yes Thayil, Irene T, PA-C  Magnesium 100 MG TABS Take 100 mg by mouth daily.   Yes [provider]  melatonin 1 MG TABS tablet Take 1 mg by mouth at bedtime.   Yes [provider]  nitroGLYCERIN  (NITROSTAT ) 0.4 MG SL tablet Place 1 tablet (0.4 mg total) under the tongue every 5 (five) minutes as needed for chest pain. 07/13/23 07/14/24 Yes Ladona Heinz, MD  polyethylene glycol (MIRALAX / GLYCOLAX) 17 g packet Take 17 g by mouth daily as needed.   Yes [provider]  SF 5000 PLUS 1.1 % CREA dental cream Take 1 Application by mouth. 04/06/24  Yes [provider]  sulfamethoxazole -trimethoprim  (BACTRIM  DS) 800-160 MG tablet Take 1 tablet by mouth. Every Monday, Wednesday and Friday 06/13/24  Yes [provider]  dexamethasone  (DECADRON ) 0.5 MG/5ML solution Swish and spit (5 mL) by mouth 4 times a day as needed for dry mouth Patient not taking:  Reported on 07/14/2024 04/25/24      Social History   Socioeconomic History   Marital status: Married    Spouse name: Not on file   Number of children: 0   Years of education: Not on file   Highest education level: Master's degree (e.g., MA, MS, MEng, MEd, MSW, MBA)  Occupational History   Occupation: unemployed  Tobacco Use   Smoking status: Never   Smokeless tobacco: Never  Vaping Use   Vaping status: Never Used  Substance and Sexual Activity   Alcohol use: Yes    Alcohol/week: 1.0 standard drink of alcohol    Types: 1 Glasses of wine per week    Comment: occassionally   Drug use: No   Sexual activity: Not Currently  Other Topics Concern   Not on file  Social History Narrative   Married. Education: college. Pt does exercise-   Social Drivers of Health   Financial Resource Strain: Low Risk  (06/29/2024)   Overall Financial Resource Strain (CARDIA)    Difficulty of Paying Living Expenses: Not hard at all  Food Insecurity: No Food Insecurity (06/29/2024)   Hunger Vital Sign    Worried About Running Out of Food in the Last Year: Never true    Ran Out of Food in the Last Year: Never true  Transportation Needs: No Transportation Needs (06/29/2024)   PRAPARE - Administrator, Civil Service (Medical): No    Lack of Transportation (Non-Medical): No  Physical Activity: Unknown (01/04/2024)   Exercise Vital Sign    Days of Exercise per Week: 0 days    Minutes of Exercise per Session: Not on file  Recent Concern: Physical Activity - Inactive (01/04/2024)   Exercise Vital Sign    Days of Exercise per Week: 0 days    Minutes of Exercise per Session: 20 min  Stress: No  Stress Concern Present (06/29/2024)   [employer] of Occupational Health - Occupational Stress Questionnaire    Feeling of Stress: Not at all  Social Connections: Unknown (06/29/2024)   Social Connection and Isolation Panel    Frequency of Communication with Friends and Family: Twice a week    Frequency of Social Gatherings with Friends and Family: Patient declined    Attends Religious Services: Never    Database administrator or Organizations: Yes    Attends Banker Meetings: 1 to 4 times per year    Marital Status: Married  Catering manager Violence: Not At Risk (05/31/2024)   Humiliation, Afraid, Rape, and Kick questionnaire    Fear of Current or Ex-Partner: No    Emotionally Abused: No    Physically Abused: No    Sexually Abused: No    Review of Systems Per HPI.   Objective:   Vitals:   07/14/24 0830  BP: 116/64  Pulse: 75  Resp: 15  Temp: 98.7 F (37.1 C)  TempSrc: Temporal  SpO2: 99%  Weight: 132 lb 6.4 oz (60.1 kg)  Height: 5' 6 (1.676 m)     Physical Exam Vitals reviewed.  Constitutional:      Appearance: Normal appearance. She is well-developed.  HENT:     Head: Normocephalic and atraumatic.  Eyes:     Conjunctiva/sclera: Conjunctivae normal.     Pupils: Pupils are equal, round, and reactive to light.  Neck:     Vascular: No carotid bruit.  Cardiovascular:     Rate and Rhythm: Normal rate and regular rhythm.     Heart sounds: Normal  heart sounds.  Pulmonary:     Effort: Pulmonary effort is normal.     Breath sounds: Normal breath sounds.  Abdominal:     Palpations: Abdomen is soft. There is no pulsatile mass.     Tenderness: There is no abdominal tenderness.  Musculoskeletal:     Right lower leg: No edema.     Left lower leg: No edema.  Skin:    General: Skin is warm and dry.     Comments: Nonblanching rash of the left lower leg, see photo.  No other similar lesions throughout left leg or right leg,  Neurological:     Mental  Status: She is alert and oriented to person, place, and time.  Psychiatric:        Mood and Affect: Mood normal.        Behavior: Behavior normal.         Assessment & Plan:  Doris Wilkerson is a 78 y.o. female . Rash and nonspecific skin eruption  - Nonblanching rash of the left lower leg, could be related to pressure from compression stocking, no other lesions identified.  Has nonblanching, possible petechial rash, advised to be seen if any new lesions erupt or not improving.  Avoid compression stockings to that area for now.  Hypotension, unspecified hypotension type - Plan: amLODipine  (NORVASC ) 2.5 MG tablet  - Hypotension symptoms have resolved, hypertension is controlled with amlodipine  2.5 mg, continue same, 49-month follow-up.  Meds ordered this encounter  Medications   amLODipine  (NORVASC ) 2.5 MG tablet    Sig: Take 1 tablet (2.5 mg total) by mouth daily.    Dispense:  90 tablet    Refill:  3   Patient Instructions  Blood pressure looks better today. No change in amlodipine  dose for now. Let me know if any return of lightheadedness, but I do not expect that to occur.  Redness on the left leg could be related to the use of compression stockings in that area.  If that is the case, it should heal up in the next week or 2.  If you notice any spread of those red patches or worsening symptoms, please follow-up to discuss further.  Take care!      Signed,   Reyes Pines, MD [facility], [facility] 07/14/24 9:10 AM

## 2024-07-14 NOTE — Patient Instructions (Signed)
 Blood pressure looks better today. No change in amlodipine  dose for now. Let me know if any return of lightheadedness, but I do not expect that to occur.  Redness on the left leg could be related to the use of compression stockings in that area.  If that is the case, it should heal up in the next week or 2.  If you notice any spread of those red patches or worsening symptoms, please follow-up to discuss further.  Take care!

## 2024-07-15 ENCOUNTER — Other Ambulatory Visit: Payer: Self-pay

## 2024-07-26 ENCOUNTER — Other Ambulatory Visit (HOSPITAL_COMMUNITY): Payer: Self-pay

## 2024-08-08 DIAGNOSIS — C9001 Multiple myeloma in remission: Secondary | ICD-10-CM | POA: Diagnosis not present

## 2024-08-08 DIAGNOSIS — C9000 Multiple myeloma not having achieved remission: Secondary | ICD-10-CM | POA: Diagnosis not present

## 2024-08-10 ENCOUNTER — Encounter: Payer: Self-pay | Admitting: Family Medicine

## 2024-08-10 ENCOUNTER — Ambulatory Visit (INDEPENDENT_AMBULATORY_CARE_PROVIDER_SITE_OTHER): Admitting: Family Medicine

## 2024-08-10 VITALS — BP 110/72 | HR 96 | Temp 98.7°F | Resp 19 | Wt 133.0 lb

## 2024-08-10 DIAGNOSIS — I10 Essential (primary) hypertension: Secondary | ICD-10-CM | POA: Diagnosis not present

## 2024-08-10 DIAGNOSIS — C9000 Multiple myeloma not having achieved remission: Secondary | ICD-10-CM

## 2024-08-10 NOTE — Patient Instructions (Addendum)
 Blood pressure looks ok today and also at your last visit. I think the temporary elevation few days ago was situational with planned bone marrow biopsy and other testing. No med changes at this time. Keep a record of your blood pressures outside of the office and bring them to the next office visit. Keep follow up with cardiology as planned. Let me know if concerns in the meantime or repeat elevations. Take care!

## 2024-08-10 NOTE — Progress Notes (Signed)
 Subjective:  Patient ID: Doris Wilkerson, female    DOB: 1946-11-01  Age: 78 y.o. MRN: [id number]  CC:  Chief Complaint  Patient presents with   Hypertension    Elevated BP at [facility] for bone marrow biopsy and PET scan Monday. 160-170/80. Checked it 3 times on both arms. 2 headaches that day. She was fasting that day.     HPI Doris Wilkerson presents for   Hypertension: With history of CAD.  Treated with amlodipine  2.5 mg daily.  Previously had required ACE inhibitor as well as higher dose of amlodipine  but with lower readings and some lightheadedness, medications were adjusted, continue to hold ACE inhibitor and decrease her amlodipine  to 2.5 mg in early August.  Stable on recheck August 21.  On Lipitor, aspirin as well.  She is undergoing treatment for multiple myeloma, had bone marrow biopsy, PET scan, prepped port, fasting that day. Overall felt ok, but had a HA a few times, mild - not severe HA. No chest pains. No meds needed for HA. Blood pressure was 160/90 - some stress that am prior to bone marrow bx.  Repeated blood pressure similar range later that day.   No recent home readings as blood pressures had been running ok.   Plan to review results with Dr. Homer on Monday with BMB results. May be transitioning to care locally.  PET scan 9/15: IMPRESSION:  1. No evidence of FDG avid active multiple myeloma.  2.  Mixed lytic and sclerotic lesions in the spine without FDG uptake, these may represent treated disease.   Of note she does have an appointment with her cardiologist, Dr. Ladona on October 7.  BP Readings from Last 3 Encounters:  08/10/24 110/72  07/14/24 116/64  06/30/24 94/62   Lab Results  Component Value Date   CREATININE 0.71 06/30/2024   Lab Results  Component Value Date   NA 141 06/30/2024   K 4.1 06/30/2024   CL 105 06/30/2024   CO2 26 06/30/2024   Hx of lymphodepletion, t cell transplant.  Unable to have vaccines until next Monday -  will be handled by transplant team - quarterly vaccinations.   History Patient Active Problem List   Diagnosis Date Noted   Abnormal finding of blood chemistry, unspecified 03/31/2024   Port-A-Cath in place 09/11/2023   Mixed hyperlipidemia 01/11/2023   Essential hypertension 01/11/2023   Physical debility 03/21/2021   Dizziness 03/21/2021   Vertigo 03/21/2021   Anemia due to antineoplastic chemotherapy 03/21/2021   Skin changes related to chemotherapy 03/21/2021   Other constipation 03/21/2021   Multiple myeloma not having achieved remission (HCC) 01/27/2021   Tear of medial meniscus of knee 10/12/2019   Pain in right knee 09/30/2019   Hordeolum externum of left lower eyelid 06/28/2018   CAD (coronary artery disease) 12/09/2011   Dyslipidemia 12/09/2011   Osteopenia 12/09/2011   Hypothyroid 12/09/2011   Asthma 12/09/2011   Past Medical History:  Diagnosis Date   Asthma    Cataract    GERD (gastroesophageal reflux disease)    Hyperlipidemia    Hypertension    Thyroid  disease    Past Surgical History:  Procedure Laterality Date   Cataract Surgery     CORONARY ANGIOPLASTY WITH STENT PLACEMENT     EYE SURGERY     IR IMAGING GUIDED PORT INSERTION  09/09/2023   ORTHOPEDIC SURGERY  2012   TONSILLECTOMY  1951   Allergies  Allergen Reactions   Poison Ivy Extract Swelling  Plavix [Clopidogrel Bisulfate]    Wound Dressing Adhesive    Other     seasonal   Prior to Admission medications   Medication Sig Start Date End Date Taking? Authorizing Provider  acyclovir  (ZOVIRAX ) 200 MG capsule Take 2 capsules (400 mg total) by mouth 2 (two) times daily. 05/26/24  Yes Thayil, Irene T, PA-C  amLODipine  (NORVASC ) 2.5 MG tablet Take 1 tablet (2.5 mg total) by mouth daily. 07/14/24  Yes Levora Doris SAUNDERS, MD  aspirin 81 MG tablet Take 81 mg by mouth daily.    Yes Burney Darice CROME, MD  atorvastatin  (LIPITOR) 80 MG tablet Take 80 mg by mouth daily. 06/16/24  Yes [provider]   cholecalciferol (VITAMIN D3) 25 MCG (1000 UNIT) tablet Take 2,000 Units by mouth daily.   Yes [provider]  famotidine (PEPCID) 20 MG tablet Take 20 mg by mouth daily.   Yes [provider]  gabapentin  (NEURONTIN ) 100 MG capsule TAKE 1 CAPSULE(100 MG) BY MOUTH TWICE DAILY AS NEEDED 04/08/24  Yes Levora Doris SAUNDERS, MD  levothyroxine  (SYNTHROID ) 25 MCG tablet TAKE 1 TABLET(25 MCG) BY MOUTH DAILY BEFORE BREAKFAST 09/15/23  Yes Tabori, Katherine E, MD  lidocaine -prilocaine  (EMLA ) cream Apply 1 Application topically as needed. 09/11/23  Yes Thayil, Irene T, PA-C  Magnesium 100 MG TABS Take 100 mg by mouth daily.   Yes [provider]  nitroGLYCERIN  (NITROSTAT ) 0.4 MG SL tablet Place 1 tablet (0.4 mg total) under the tongue every 5 (five) minutes as needed for chest pain. 07/13/23 08/10/24 Yes Ladona Heinz, MD  polyethylene glycol (MIRALAX / GLYCOLAX) 17 g packet Take 17 g by mouth daily as needed.   Yes [provider]  SF 5000 PLUS 1.1 % CREA dental cream Take 1 Application by mouth. 04/06/24  Yes [provider]  sulfamethoxazole -trimethoprim  (BACTRIM  DS) 800-160 MG tablet Take 1 tablet by mouth. Every Monday, Wednesday and Friday 06/13/24  Yes [provider]  dexamethasone  (DECADRON ) 0.5 MG/5ML solution Swish and spit (5 mL) by mouth 4 times a day as needed for dry mouth Patient not taking: Reported on 08/10/2024 04/25/24     melatonin 1 MG TABS tablet Take 1 mg by mouth at bedtime. Patient not taking: Reported on 08/10/2024    [provider]   Social History   Socioeconomic History   Marital status: Married    Spouse name: Not on file   Number of children: 0   Years of education: Not on file   Highest education level: Master's degree (e.g., MA, MS, MEng, MEd, MSW, MBA)  Occupational History   Occupation: unemployed  Tobacco Use   Smoking status: Never   Smokeless tobacco: Never  Vaping Use   Vaping status: Never Used  Substance  and Sexual Activity   Alcohol use: Yes    Alcohol/week: 1.0 standard drink of alcohol    Types: 1 Glasses of wine per week    Comment: occassionally   Drug use: No   Sexual activity: Not Currently  Other Topics Concern   Not on file  Social History Narrative   Married. Education: college. Pt does exercise-   Social Drivers of Health   Financial Resource Strain: Low Risk  (06/29/2024)   Overall Financial Resource Strain (CARDIA)    Difficulty of Paying Living Expenses: Not hard at all  Food Insecurity: No Food Insecurity (06/29/2024)   Hunger Vital Sign    Worried About Running Out of Food in the Last Year: Never true  Ran Out of Food in the Last Year: Never true  Transportation Needs: No Transportation Needs (06/29/2024)   PRAPARE - Administrator, Civil Service (Medical): No    Lack of Transportation (Non-Medical): No  Physical Activity: Unknown (01/04/2024)   Exercise Vital Sign    Days of Exercise per Week: 0 days    Minutes of Exercise per Session: Not on file  Recent Concern: Physical Activity - Inactive (01/04/2024)   Exercise Vital Sign    Days of Exercise per Week: 0 days    Minutes of Exercise per Session: 20 min  Stress: No Stress Concern Present (06/29/2024)   [employer] of Occupational Health - Occupational Stress Questionnaire    Feeling of Stress: Not at all  Social Connections: Unknown (06/29/2024)   Social Connection and Isolation Panel    Frequency of Communication with Friends and Family: Twice a week    Frequency of Social Gatherings with Friends and Family: Patient declined    Attends Religious Services: Never    Database administrator or Organizations: Yes    Attends Banker Meetings: 1 to 4 times per year    Marital Status: Married  Catering manager Violence: Not At Risk (05/31/2024)   Humiliation, Afraid, Rape, and Kick questionnaire    Fear of Current or Ex-Partner: No    Emotionally Abused: No    Physically Abused: No     Sexually Abused: No    Review of Systems  Constitutional:  Negative for fatigue and unexpected weight change.  Respiratory:  Negative for chest tightness and shortness of breath.   Cardiovascular:  Negative for chest pain, palpitations and leg swelling.  Gastrointestinal:  Negative for abdominal pain and blood in stool.  Neurological:  Positive for headaches (mild, as above.). Negative for dizziness, syncope and light-headedness.     Objective:   Vitals:   08/10/24 0839  BP: 110/72  Pulse: 96  Resp: 19  Temp: 98.7 F (37.1 C)  TempSrc: Temporal  SpO2: 100%  Weight: 133 lb (60.3 kg)     Physical Exam Vitals reviewed.  Constitutional:      Appearance: Normal appearance. She is well-developed.  HENT:     Head: Normocephalic and atraumatic.  Eyes:     Conjunctiva/sclera: Conjunctivae normal.     Pupils: Pupils are equal, round, and reactive to light.  Neck:     Vascular: No carotid bruit.  Cardiovascular:     Rate and Rhythm: Normal rate and regular rhythm.     Heart sounds: Normal heart sounds.  Pulmonary:     Effort: Pulmonary effort is normal.     Breath sounds: Normal breath sounds.  Abdominal:     Palpations: Abdomen is soft. There is no pulsatile mass.     Tenderness: There is no abdominal tenderness.  Musculoskeletal:     Right lower leg: No edema.     Left lower leg: No edema.  Skin:    General: Skin is warm and dry.  Neurological:     Mental Status: She is alert and oriented to person, place, and time.  Psychiatric:        Mood and Affect: Mood normal.        Behavior: Behavior normal.        Assessment & Plan:  Doris Wilkerson is a 78 y.o. female . Essential hypertension  Multiple myeloma, remission status unspecified (HCC) Elevated blood pressure readings 2 days ago, but I do think that was situational, prior  to bone marrow biopsy, and PET scan.  Unlikely pheochromocytoma without other associated symptoms. Blood pressure last visit as well  as in office today is well-controlled and with previous lightheadedness, likely overcontrolled blood pressure, I am hesitant to change her amlodipine  dosage at this time.  Other than mild headaches, asymptomatic 2 days ago, and headache resolved.  Asymptomatic at this time with controlled BP in office.  Will continue same dose of amlodipine  for now with RTC precautions if elevated readings at home.  She does have a follow-up in a few weeks with cardiology and blood pressure can be rechecked at that time.  In regards to her multiple myeloma, recent PET scan appears to be overall reassuring with likely findings of treated disease.  Has follow-up with hematology planned on Monday to review the results.  Continue plan for vaccinations through transplant service.  No orders of the defined types were placed in this encounter.  Patient Instructions  Blood pressure looks ok today and also at your last visit. I think the temporary elevation few days ago was situational with planned bone marrow biopsy and other testing. No med changes at this time. Keep a record of your blood pressures outside of the office and bring them to the next office visit. Keep follow up with cardiology as planned. Let me know if concerns in the meantime or repeat elevations. Take care!    Signed,   Doris Pines, MD [facility], [facility] 08/10/24 9:57 AM

## 2024-08-11 ENCOUNTER — Telehealth: Payer: Self-pay | Admitting: *Deleted

## 2024-08-11 NOTE — Telephone Encounter (Signed)
 Received vm message from pt asking when could she come back to [facility] s/p CAR-T therapy at [facility]. She states she has an appt with Dr. Odella on Monday and will be asking him as well.  Please advise

## 2024-08-12 ENCOUNTER — Telehealth: Payer: Self-pay | Admitting: *Deleted

## 2024-08-12 NOTE — Telephone Encounter (Signed)
 TCT patient in response to vm left by her requesting call back. She is asking when can she return to [facility] s/p Car-T therapy @ [facility]. No answer but was ablew to elave detailed message on her identified vm. Advised that Dr. Federico recommends she follow Dr. Verena recommendation when she sees him on 08/15/24

## 2024-08-15 ENCOUNTER — Encounter: Payer: Self-pay | Admitting: Hematology and Oncology

## 2024-08-15 DIAGNOSIS — D801 Nonfamilial hypogammaglobulinemia: Secondary | ICD-10-CM | POA: Diagnosis not present

## 2024-08-15 DIAGNOSIS — C9001 Multiple myeloma in remission: Secondary | ICD-10-CM | POA: Diagnosis not present

## 2024-08-15 DIAGNOSIS — C9000 Multiple myeloma not having achieved remission: Secondary | ICD-10-CM | POA: Diagnosis not present

## 2024-08-15 DIAGNOSIS — Z7982 Long term (current) use of aspirin: Secondary | ICD-10-CM | POA: Diagnosis not present

## 2024-08-15 DIAGNOSIS — Z23 Encounter for immunization: Secondary | ICD-10-CM | POA: Diagnosis not present

## 2024-08-15 DIAGNOSIS — I2510 Atherosclerotic heart disease of native coronary artery without angina pectoris: Secondary | ICD-10-CM | POA: Diagnosis not present

## 2024-08-15 DIAGNOSIS — Z79899 Other long term (current) drug therapy: Secondary | ICD-10-CM | POA: Diagnosis not present

## 2024-08-17 ENCOUNTER — Other Ambulatory Visit: Payer: Self-pay | Admitting: *Deleted

## 2024-08-17 DIAGNOSIS — C9000 Multiple myeloma not having achieved remission: Secondary | ICD-10-CM

## 2024-08-22 ENCOUNTER — Other Ambulatory Visit: Payer: Self-pay | Admitting: Family Medicine

## 2024-08-22 DIAGNOSIS — E039 Hypothyroidism, unspecified: Secondary | ICD-10-CM

## 2024-08-30 ENCOUNTER — Ambulatory Visit: Attending: Cardiology | Admitting: Cardiology

## 2024-08-30 ENCOUNTER — Encounter: Payer: Self-pay | Admitting: Cardiology

## 2024-08-30 VITALS — BP 116/62 | HR 85 | Ht 65.0 in | Wt 130.8 lb

## 2024-08-30 DIAGNOSIS — I2510 Atherosclerotic heart disease of native coronary artery without angina pectoris: Secondary | ICD-10-CM | POA: Insufficient documentation

## 2024-08-30 DIAGNOSIS — E7800 Pure hypercholesterolemia, unspecified: Secondary | ICD-10-CM | POA: Insufficient documentation

## 2024-08-30 NOTE — Progress Notes (Signed)
 Cardiology Office Note:  .   Date:  08/30/2024  ID:  Doris Wilkerson, Doris Wilkerson May 05, 1946, MRN [id number] PCP: Levora Reyes SAUNDERS, MD  [facility] Providers Cardiologist:  Gordy Bergamo, MD   History of Present Illness: .   Doris Wilkerson is a 78 y.o. 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 Past medical history significant for hypertension, hyperlipidemia. 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  She is now getting CAR-T Product: Carvykti (modified T cells)  She has had an essentially normal echocardiogram in 2023 and a normal nuclear stress test as well.  She remains asymptomatic from cardiac standpoint.  Cardiac Studies relevent.    ECHOCARDIOGRAM COMPLETE 03/10/2024  1. Left ventricular ejection fraction, by estimation, is 50 to 55%. The left ventricle has low normal function. The left ventricle demonstrates global hypokinesis. There is mild left ventricular hypertrophy of the basal-septal segment. Left ventricular diastolic parameters are consistent with Grade I diastolic dysfunction (impaired relaxation). The average left ventricular global longitudinal strain is -13.1 %. The global longitudinal strain is abnormal. 2. Right ventricular systolic function is normal. The right ventricular size is normal. Tricuspid regurgitation signal is inadequate for assessing PA pressure.  Lexiscan  (with Mod Bruce protocol) Nuclear stress test 02/03/2022: Normal ECG stress. The patient was injected with 0.4 mg of Intravenous Lexiscan  over 15 sec. Additionally, patient exercised on a Modified Bruce protocol. Achieved 100% MPHR.  Myocardial perfusion is normal. Stress LVEF 51%    Discussed the use of AI scribe software for clinical note transcription with the patient, who gave verbal consent to proceed.  History of Present  Illness Doris Wilkerson is a 78 year old female with multiple myeloma who presents for cardiovascular evaluation post-CAR T-cell therapy.  Cardiovascular status post-car t-cell therapy - Completed CAR T-cell therapy approximately 100 days ago for multiple myeloma - Experienced fever and hypotension post-therapy, requiring a four-day hospitalization - During therapy, was off all heart medications for four months - Resuming lidocaine  led to hypotension - Currently on 2.5 mg amlodipine  with adequate blood pressure control - Takes cholesterol medication and baby aspirin - No new cardiac symptoms  Hematologic status - Mild anemia with hemoglobin level of 11.9 g/dL  Constitutional and systemic symptoms - No current fever - No significant weight loss  Recent diagnostic evaluation - Recent echocardiogram and stress test in 2023 as part of workup - Thyroid , kidney, and liver functions are normal  Labs   Lab Results  Component Value Date   CHOL 117 06/30/2024   HDL 64.40 06/30/2024   LDLCALC 41 06/30/2024   TRIG 58.0 06/30/2024   CHOLHDL 2 06/30/2024   No results found for: LIPOA  Recent Labs    02/26/24 1236 03/04/24 1133 03/11/24 0805 06/30/24 0917  NA 138 138 139 141  K 4.0 3.9 4.1 4.1  CL 108 107 109 105  CO2 24 25 25 26   GLUCOSE 139* 134* 83 92  BUN 17 18 17 13   CREATININE 1.09* 0.66 0.63 0.71  CALCIUM  9.4 10.0 9.2 10.2  GFRNONAA 52* >60 >60  --     Lab Results  Component Value Date   ALT 26 06/30/2024   AST 35 06/30/2024   ALKPHOS 44 06/30/2024   BILITOT 0.7 06/30/2024      Latest Ref Rng & Units 06/30/2024    9:17 AM 03/11/2024    8:05 AM  03/04/2024   11:33 AM  CBC  WBC 4.0 - 10.5 K/uL 6.1  4.7  5.4   Hemoglobin 12.0 - 15.0 g/dL 87.6  89.1  88.0   Hematocrit 36.0 - 46.0 % 37.9  32.3  35.2   Platelets 150.0 - 400.0 K/uL 272.0  164  187    Lab Results  Component Value Date   HGBA1C 4.6 01/07/2024    Lab Results  Component Value Date   TSH 1.45  06/30/2024     ROS  Review of Systems  Cardiovascular:  Negative for chest pain, dyspnea on exertion and leg swelling.   Physical Exam:   VS:  BP 116/62 (BP Location: Left Arm, Patient Position: Sitting, Cuff Size: Normal)   Pulse 85   Ht 5' 5 (1.651 m)   Wt 130 lb 12.8 oz (59.3 kg)   SpO2 99%   BMI 21.77 kg/m    Wt Readings from Last 3 Encounters:  08/30/24 130 lb 12.8 oz (59.3 kg)  08/10/24 133 lb (60.3 kg)  07/14/24 132 lb 6.4 oz (60.1 kg)    BP Readings from Last 3 Encounters:  08/30/24 116/62  08/10/24 110/72  07/14/24 116/64   Physical Exam Neck:     Vascular: No carotid bruit or JVD.  Cardiovascular:     Rate and Rhythm: Normal rate and regular rhythm.     Pulses: Intact distal pulses.     Heart sounds: Normal heart sounds. No murmur heard.    No gallop.  Pulmonary:     Effort: Pulmonary effort is normal.     Breath sounds: Normal breath sounds.  Abdominal:     General: Bowel sounds are normal.     Palpations: Abdomen is soft.  Musculoskeletal:     Right lower leg: No edema.     Left lower leg: No edema.    EKG:       EKG 03/11/2024: Normal sinus rhythm at rate of 80 bpm, left intrafascicular block.  No evidence of ischemia.  Compared to 06/20/2011, no significant change.  ASSESSMENT AND PLAN: .      ICD-10-CM   1. Coronary artery disease involving native coronary artery of native heart without angina pectoris  I25.10     2. Primary hypertension  I10     3. Hypercholesteremia  E78.00       Assessment and Plan Assessment & Plan Atherosclerotic heart disease of native coronary artery without angina Heart function is at the lower limit of normal (50-55%) with no current angina. Previous stress test in 2023 showed normal heart function. Echocardiogram was performed for CAR T-cell therapy eligibility and was normal. - Recheck echocardiogram in 6 months to 1 year  Pure hypercholesterolemia Cholesterol levels are well-controlled with LDL at 41, within  the desired range. - Continue current cholesterol medication  Hypertension, resolved Hypertension is resolved, likely due to chemotherapy effects. Blood pressure is stable and within normal range. - Discontinue amlodipine  2.5 mg - Monitor blood pressure; if it exceeds 130, consider restarting amlodipine   Multiple myeloma in remission post-CAR T-cell therapy Multiple myeloma is in full remission following CAR T-cell therapy. She is approximately 100 days post-transplant with minimal residual disease as indicated by recent bone marrow biopsy and PET scan. She experienced cytokine release syndrome and bone pain during treatment but is now stable. - Continue follow-up with Dr. Homer in [facility] as primary oncologist - Continue quarterly monitoring with Dr. 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 This level is acceptable given her cancer therapy history.   Follow up: As needed  Signed,  Gordy Bergamo, MD, [facility] 08/30/2024, 11:20 AM [facility] [address] [place], [place] [address] Phone: [phone]. Fax:  [phone]

## 2024-08-30 NOTE — Patient Instructions (Signed)
 Medication Instructions:   Your physician recommends that you continue on your current medications as directed. Please refer to the Current Medication list given to you today.  *If you need a refill on your cardiac medications before your next appointment, please call your pharmacy*  Lab Work: NONE ORDERED  TODAY    If you have labs (blood work) drawn today and your tests are completely normal, you will receive your results only by: MyChart Message (if you have MyChart) OR A paper copy in the mail If you have any lab test that is abnormal or we need to change your treatment, we will call you to review the results.  Testing/Procedures: NONE ORDERED  TODAY   Follow-Up: At [facility], you and your health needs are our priority.  As part of our continuing mission to provide you with exceptional heart care, our providers are all part of one team.  This team includes your primary Cardiologist (physician) and Advanced Practice Providers or APPs (Physician Assistants and Nurse Practitioners) who all work together to provide you with the care you need, when you need it.  Your next appointment: CONTACT [facility] [phone] AS NEEDED FOR  ANY CARDIAC RELATED SYMPTOMS   We recommend signing up for the patient portal called MyChart.  Sign up information is provided on this After Visit Summary.  MyChart is used to connect with patients for Virtual Visits (Telemedicine).  Patients are able to view lab/test results, encounter notes, upcoming appointments, etc.  Non-urgent messages can be sent to your provider as well.   To learn more about what you can do with MyChart, go to [email or web address].   Other Instructions

## 2024-08-31 ENCOUNTER — Other Ambulatory Visit: Payer: Self-pay | Admitting: Family Medicine

## 2024-08-31 NOTE — Telephone Encounter (Signed)
 Okay to refill under your name?  Requested Prescriptions   Pending Prescriptions Disp Refills   atorvastatin  (LIPITOR) 80 MG tablet [Pharmacy Med Name: Atorvastatin  Calcium  80 MG Oral Tablet] 90 tablet 3    Sig: TAKE 1 TABLET BY MOUTH DAILY AT  6 PM     Date of patient request: 08/31/24 Last office visit: 08/10/2024 Upcoming visit: 01/16/2025

## 2024-09-08 ENCOUNTER — Other Ambulatory Visit: Payer: Self-pay | Admitting: Family Medicine

## 2024-09-08 DIAGNOSIS — G6289 Other specified polyneuropathies: Secondary | ICD-10-CM

## 2024-09-27 ENCOUNTER — Other Ambulatory Visit: Payer: Self-pay | Admitting: Hematology and Oncology

## 2024-09-27 ENCOUNTER — Encounter: Payer: Self-pay | Admitting: Hematology and Oncology

## 2024-09-27 DIAGNOSIS — C9000 Multiple myeloma not having achieved remission: Secondary | ICD-10-CM

## 2024-09-28 ENCOUNTER — Inpatient Hospital Stay (HOSPITAL_BASED_OUTPATIENT_CLINIC_OR_DEPARTMENT_OTHER): Admitting: Hematology and Oncology

## 2024-09-28 ENCOUNTER — Inpatient Hospital Stay: Attending: Hematology and Oncology

## 2024-09-28 VITALS — BP 156/91 | HR 92 | Temp 97.4°F | Resp 16 | Wt 131.0 lb

## 2024-09-28 DIAGNOSIS — C9001 Multiple myeloma in remission: Secondary | ICD-10-CM

## 2024-09-28 DIAGNOSIS — Z95828 Presence of other vascular implants and grafts: Secondary | ICD-10-CM | POA: Diagnosis not present

## 2024-09-28 DIAGNOSIS — Z7982 Long term (current) use of aspirin: Secondary | ICD-10-CM | POA: Insufficient documentation

## 2024-09-28 DIAGNOSIS — C9000 Multiple myeloma not having achieved remission: Secondary | ICD-10-CM | POA: Diagnosis not present

## 2024-09-28 DIAGNOSIS — Z79624 Long term (current) use of inhibitors of nucleotide synthesis: Secondary | ICD-10-CM | POA: Diagnosis not present

## 2024-09-28 DIAGNOSIS — Z79899 Other long term (current) drug therapy: Secondary | ICD-10-CM | POA: Diagnosis not present

## 2024-09-28 DIAGNOSIS — Z7961 Long term (current) use of immunomodulator: Secondary | ICD-10-CM | POA: Diagnosis not present

## 2024-09-28 LAB — CMP (CANCER CENTER ONLY)
ALT: 20 U/L (ref 0–44)
AST: 22 U/L (ref 15–41)
Albumin: 4.4 g/dL (ref 3.5–5.0)
Alkaline Phosphatase: 61 U/L (ref 38–126)
Anion gap: 6 (ref 5–15)
BUN: 19 mg/dL (ref 8–23)
CO2: 27 mmol/L (ref 22–32)
Calcium: 9.9 mg/dL (ref 8.9–10.3)
Chloride: 108 mmol/L (ref 98–111)
Creatinine: 0.74 mg/dL (ref 0.44–1.00)
GFR, Estimated: >60 mL/min (ref >60)
Glucose, Bld: 104 mg/dL — ABNORMAL HIGH (ref 70–99)
Potassium: 4.2 mmol/L (ref 3.5–5.1)
Sodium: 141 mmol/L (ref 135–145)
Total Bilirubin: 0.6 mg/dL (ref 0.0–1.2)
Total Protein: 6 g/dL — ABNORMAL LOW (ref 6.5–8.1)

## 2024-09-28 LAB — CBC WITH DIFFERENTIAL (CANCER CENTER ONLY)
Abs Immature Granulocytes: 0 K/uL (ref 0.00–0.07)
Basophils Absolute: 0 K/uL (ref 0.0–0.1)
Basophils Relative: 1 %
Eosinophils Absolute: 0.1 K/uL (ref 0.0–0.5)
Eosinophils Relative: 2 %
HCT: 36.8 % (ref 36.0–46.0)
Hemoglobin: 11.9 g/dL — ABNORMAL LOW (ref 12.0–15.0)
Immature Granulocytes: 0 %
Lymphocytes Relative: 18 %
Lymphs Abs: 0.6 K/uL — ABNORMAL LOW (ref 0.7–4.0)
MCH: 27.2 pg (ref 26.0–34.0)
MCHC: 32.3 g/dL (ref 30.0–36.0)
MCV: 84.2 fL (ref 80.0–100.0)
Monocytes Absolute: 0.5 K/uL (ref 0.1–1.0)
Monocytes Relative: 15 %
Neutro Abs: 2.2 K/uL (ref 1.7–7.7)
Neutrophils Relative %: 64 %
Platelet Count: 188 K/uL (ref 150–400)
RBC: 4.37 MIL/uL (ref 3.87–5.11)
RDW: 18.7 % — ABNORMAL HIGH (ref 11.5–15.5)
WBC Count: 3.4 K/uL — ABNORMAL LOW (ref 4.0–10.5)
nRBC: 0 % (ref 0.0–0.2)

## 2024-09-28 LAB — MAGNESIUM: Magnesium: 2.1 mg/dL (ref 1.7–2.4)

## 2024-09-28 NOTE — Progress Notes (Unsigned)
 [facility] [phone]   [phone]  PROGRESS NOTE  Patient Care Team: Levora Reyes SAUNDERS, MD as PCP - General (Family Medicine) Ladona Heinz, MD as PCP - Cardiology (Cardiology) Ladona Heinz, MD as Consulting Physician (Cardiology) Debarah Lorrene DEL., MD (Ophthalmology)  Hematological/Oncological History # Doris Wilkerson Multiple Myeloma 12/26/2020: MRI of pelvis showed lytic lesions on L4, L5, the sacrum, with pathologic fracture of left inferior pubic ramus. Concerning for multiple myeloma vs metastatic disease 12/31/2020: establish care with Dr. Federico. UPEP showed marked Bence Jones protein, findings concerning for multiple myleoma 01/21/2021: bone marrow biopsy confirms multiple myeloma with atypical plasma cells representing 28% of all cells in the aspirate  02/01/2021: Cycle 1 Day 1 of VRd chemotherapy 02/22/2021: Cycle 2 Day 1 of VRd chemotherapy 03/15/2021: Cycle 3 Day 1 of VRd chemotherapy 04/05/2021: Cycle 4 Day 1 of VRd chemotherapy 04/26/2021: Cycle 5 Day 1 of VRd chemotherapy 05/17/2021: Cycle 6 Day 1 of VRd chemotherapy 06/07/2021:  Cycle 7 Day 1 of VRd chemotherapy 06/28/2021: Cycle 8 Day 1 of VRd chemotherapy 07/19/2021: start maintenance revlimid  10mg  PO daily.  03/26/2022: Cycle 1 Day 1 of Dara/Pom/Dex 04/24/2023: Cycle 2 Day 1 of Dara/Pom/Dex 05/22/2023: Cycle 3 Day 1 of Dara/Pom/Dex 06/19/2023: Cycle 4 Day 1 of Dara/Pom/Dex 07/17/2023: Cycle 5 Day 1 of Dara/Pom/Dex 08/28/2023: d/c Dara/Pom/Dex due to progression.  09/11/2023: Cycle 1 Day 1 of Kyprolis /Dex 10/08/2023: Cycle 2 Day 1 of Kyprolis /Dex 11/06/2023: Cycle 3 Day 1 of Kyprolis /Dex 12/04/2023: Cycle 4 Day 1 of Kyprolis /Dex 01/01/2024: Cycle 5 Day 1 of Kyprolis /Dex 01/29/2024: Cycle 6 Day 1 of Kyprolis /Dex 02/26/2024: Cycle 7 Day 1 of Kyprolis /Dex 05/16/2024: Carvykti Treatment after [place]  bridge.   Interval History:  Doris Wilkerson 78 y.o. female with medical history significant for  lambda light Wilkerson multiple myeloma who presents for a follow up visit. She was last seen on 03/11/2024. In the interim has completed Carykti CAR-T therapy at [facility].  On exam today Doris Wilkerson is accompanied by her husband.  She reports that she had quite experienced throughout the course of treatment.  She reports that the Talq therapy was particular difficult with mucositis and skin issues.  She was not able to swallow anything but clear liquids and it took over 120 days to rebound.  She also did have cytokine release syndrome with the CAR-T therapy.  She had really bad shaking chills and did receive toclistimab in order to help with this.  She reports that she continues to have issues with her nails and hair thinning which she thinks may be skin toxicity due to the prior treatments.  She reports that unfortunately she did have a fall from standing last month when she tripped over a cord at a conference room.  She had extensive bruising of her lower extremity which is continuing to heal.  Otherwise she denies any current fevers, chills, sweats, nausea, vomiting or diarrhea.  Full 10 point ROS otherwise negative.  The bulk of our discussion focused on the steps moving forward with surveillance and balancing our visits with [facility].   MEDICAL HISTORY:  Past Medical History:  Diagnosis Date   Asthma    Cataract    GERD (gastroesophageal reflux disease)    Hyperlipidemia    Thyroid  disease     SURGICAL HISTORY: Past Surgical History:  Procedure Laterality Date   Cataract Surgery     CORONARY ANGIOPLASTY WITH STENT PLACEMENT     EYE SURGERY     IR IMAGING GUIDED  PORT INSERTION  09/09/2023   ORTHOPEDIC SURGERY  2012   TONSILLECTOMY  1951    SOCIAL HISTORY: Social History   Socioeconomic History   Marital status: Married    Spouse name: Not on file   Number of children: 0   Years of education: Not on file   Highest education level: Master's degree (e.g., MA, MS, MEng, MEd, MSW, MBA)   Occupational History   Occupation: unemployed  Tobacco Use   Smoking status: Never   Smokeless tobacco: Never  Vaping Use   Vaping status: Never Used  Substance and Sexual Activity   Alcohol use: Yes    Alcohol/week: 1.0 standard drink of alcohol    Types: 1 Glasses of wine per week    Comment: occassionally   Drug use: No   Sexual activity: Not Currently  Other Topics Concern   Not on file  Social History Narrative   Married. Education: college. Pt does exercise-   Social Drivers of Health   Financial Resource Strain: Low Risk  (06/29/2024)   Overall Financial Resource Strain (CARDIA)    Difficulty of Paying Living Expenses: Not hard at all  Food Insecurity: No Food Insecurity (06/29/2024)   Hunger Vital Sign    Worried About Running Out of Food in the Last Year: Never true    Ran Out of Food in the Last Year: Never true  Transportation Needs: No Transportation Needs (06/29/2024)   PRAPARE - Administrator, Civil Service (Medical): No    Lack of Transportation (Non-Medical): No  Physical Activity: Unknown (01/04/2024)   Exercise Vital Sign    Days of Exercise per Week: 0 days    Minutes of Exercise per Session: Not on file  Recent Concern: Physical Activity - Inactive (01/04/2024)   Exercise Vital Sign    Days of Exercise per Week: 0 days    Minutes of Exercise per Session: 20 min  Stress: No Stress Concern Present (06/29/2024)   [employer] of Occupational Health - Occupational Stress Questionnaire    Feeling of Stress: Not at all  Social Connections: Unknown (06/29/2024)   Social Connection and Isolation Panel    Frequency of Communication with Friends and Family: Twice a week    Frequency of Social Gatherings with Friends and Family: Patient declined    Attends Religious Services: Never    Database Administrator or Organizations: Yes    Attends Banker Meetings: 1 to 4 times per year    Marital Status: Married  Catering Manager Violence: Not  At Risk (05/31/2024)   Humiliation, Afraid, Rape, and Kick questionnaire    Fear of Current or Ex-Partner: No    Emotionally Abused: No    Physically Abused: No    Sexually Abused: No    FAMILY HISTORY: Family History  Problem Relation Age of Onset   Cancer Father    Heart disease Brother    Dementia Brother    Leukemia Brother    Breast cancer Neg Hx    Colon cancer Neg Hx    Esophageal cancer Neg Hx    Pancreatic cancer Neg Hx    Rectal cancer Neg Hx    Stomach cancer Neg Hx     ALLERGIES:  is allergic to poison ivy extract, plavix [clopidogrel bisulfate], wound dressing adhesive, and other.  MEDICATIONS:  Current Outpatient Medications  Medication Sig Dispense Refill   acyclovir  (ZOVIRAX ) 200 MG capsule Take 2 capsules (400 mg total) by mouth 2 (two) times daily.  60 capsule 3   aspirin 81 MG tablet Take 81 mg by mouth daily.      atorvastatin  (LIPITOR) 80 MG tablet TAKE 1 TABLET BY MOUTH DAILY AT  6 PM 90 tablet 3   cholecalciferol (VITAMIN D3) 25 MCG (1000 UNIT) tablet Take 2,000 Units by mouth daily.     famotidine (PEPCID) 20 MG tablet Take 20 mg by mouth daily.     gabapentin  (NEURONTIN ) 100 MG capsule TAKE 1 CAPSULE BY MOUTH TWICE  DAILY AS NEEDED 180 capsule 3   levothyroxine  (SYNTHROID ) 25 MCG tablet TAKE 1 TABLET BY MOUTH DAILY  BEFORE BREAKFAST 90 tablet 3   lidocaine -prilocaine  (EMLA ) cream Apply 1 Application topically as needed. 30 g 0   Magnesium 100 MG TABS Take 100 mg by mouth daily.     melatonin 1 MG TABS tablet Take 1 mg by mouth at bedtime.     nitroGLYCERIN  (NITROSTAT ) 0.4 MG SL tablet Place 1 tablet (0.4 mg total) under the tongue every 5 (five) minutes as needed for chest pain. 25 tablet 1   polyethylene glycol (MIRALAX / GLYCOLAX) 17 g packet Take 17 g by mouth daily as needed.     SF 5000 PLUS 1.1 % CREA dental cream Take 1 Application by mouth.     sulfamethoxazole -trimethoprim  (BACTRIM  DS) 800-160 MG tablet Take 1 tablet by mouth. Every Monday,  Wednesday and Friday     No current facility-administered medications for this visit.    REVIEW OF SYSTEMS:   Constitutional: ( - ) fevers, ( - )  chills , ( - ) night sweats Eyes: ( - ) blurriness of vision, ( - ) double vision, ( - ) watery eyes Ears, nose, mouth, throat, and face: ( - ) mucositis, ( - ) sore throat Respiratory: ( - ) cough, ( - ) dyspnea, ( - ) wheezes Cardiovascular: ( - ) palpitation, ( - ) chest discomfort, ( - ) lower extremity swelling Gastrointestinal:  ( - ) nausea, ( - ) heartburn, ( - ) change in bowel habits Skin: ( - ) abnormal skin rashes Lymphatics: ( - ) new lymphadenopathy, ( - ) easy bruising Neurological: ( +) numbness, ( - ) tingling, ( - ) new weaknesses Behavioral/Psych: ( - ) mood change, ( - ) new changes  All other systems were reviewed with the patient and are negative.  PHYSICAL EXAMINATION: ECOG PERFORMANCE STATUS: 1 - Symptomatic but completely ambulatory  Vitals:   09/28/24 0832 09/28/24 0833  BP: (!) 169/90 (!) 156/91  Pulse: 92   Resp: 16   Temp: (!) 97.4 F (36.3 C)   SpO2: 100%      Filed Weights   09/28/24 0832  Weight: 131 lb (59.4 kg)     GENERAL: well appearing elderly Caucasian female in NAD  SKIN: skin color, texture, turgor are normal, no rashes or significant lesions EYES: conjunctiva are pink and non-injected, sclera clear LUNGS: clear to auscultation and percussion with normal breathing effort HEART: regular rate & rhythm and no murmurs and trace lower extremity edema Musculoskeletal: no cyanosis of digits and no clubbing  PSYCH: alert & oriented x 3, fluent speech NEURO: no focal motor/sensory deficits  LABORATORY DATA:  I have reviewed the data as listed    Latest Ref Rng & Units 09/28/2024    7:38 AM 06/30/2024    9:17 AM 03/11/2024    8:05 AM  CBC  WBC 4.0 - 10.5 K/uL 3.4  6.1  4.7   Hemoglobin 12.0 -  15.0 g/dL 88.0  87.6  89.1   Hematocrit 36.0 - 46.0 % 36.8  37.9  32.3   Platelets 150 - 400 K/uL  188  272.0  164        Latest Ref Rng & Units 09/28/2024    7:38 AM 06/30/2024    9:17 AM 03/11/2024    8:05 AM  CMP  Glucose 70 - 99 mg/dL 895  92  83   BUN 8 - 23 mg/dL 19  13  17    Creatinine 0.44 - 1.00 mg/dL 9.25  9.28  9.36   Sodium 135 - 145 mmol/L 141  141  139   Potassium 3.5 - 5.1 mmol/L 4.2  4.1  4.1   Chloride 98 - 111 mmol/L 108  105  109   CO2 22 - 32 mmol/L 27  26  25    Calcium  8.9 - 10.3 mg/dL 9.9  89.7  9.2   Total Protein 6.5 - 8.1 g/dL 6.0  6.0  5.8   Total Bilirubin 0.0 - 1.2 mg/dL 0.6  0.7  0.9   Alkaline Phos 38 - 126 U/L 61  44  50   AST 15 - 41 U/L 22  35  19   ALT 0 - 44 U/L 20  26  19      Lab Results  Component Value Date   MPROTEIN Not Observed 03/11/2024   MPROTEIN Not Observed 02/26/2024   MPROTEIN Not Observed 02/12/2024   Lab Results  Component Value Date   KPAFRELGTCHN 3.9 03/11/2024   KPAFRELGTCHN 4.5 02/26/2024   KPAFRELGTCHN 0.8 (L) 02/12/2024   LAMBDASER 155.5 (H) 03/11/2024   LAMBDASER 113.3 (H) 02/26/2024   LAMBDASER 120.4 (H) 02/12/2024   KAPLAMBRATIO 0.03 (L) 03/11/2024   KAPLAMBRATIO 0.04 (L) 02/26/2024   KAPLAMBRATIO 0.01 (L) 02/12/2024    RADIOGRAPHIC STUDIES: No results found.    ASSESSMENT & PLAN Doris Wilkerson is a 78 y.o. female with medical history significant for lambda light Wilkerson multiple myeloma who presents for a follow up visit. Doris Wilkerson is not a candidate for bone marrow transplant based on her advanced age.    # Lambda Light Wilkerson Multiple Myeloma--VGPR on maintenance --diagnosis confirmed with bone marrow biopsy and urine protein analysis.   --patient has good functional status and would be an excellent candidate for VRd chemotherapy. I do not believe she would be a bone marrow transplant candidate based on her age.  --Received VRD chemotherapy from 02/01/2021-07/12/2021 then started maintenance Revlimid  on 07/19/2021.  --Due to rising lambda light Wilkerson, recommended to switch to  Daratumumab /Pomalyst /Dexamethasone . --Received Dara/Pom/Dex from 03/27/2023-08/28/2023. --Due to rising lambda light Wilkerson, recommend to switch to Kyprolis /Dex 3 weeks on 1 week off, starting today. Plan: --patient received Carvykti therapy at [facility] on 05/16/2024 --Labs today show white blood cell count 3.4, hemoglobin 11.9, MCV 84.2, platelets 188. Creatinine slighly above normal at 1.09. LFTs normal. Myeloma labs pending.  --Plan to obtain myeloma labs q 6 weeks with port flush.  --Patient following with Dr. Odella at [facility], vaccines to be administered there. Going to visit q 3 months.  -- port flush q 6 weeks  --Return to clinic in 3 month with interval labs/port flush q 6 weeks.   #Back Pain/Left Hip Pain  -- Currently well controlled on gabapentin  and Tylenol  --CT thoracic and lumbar spine showed no lytic lesions or sequelae of multiple myeloma --MRI thoracic and lumbar spine from 07/07/2022 for baseline imaging. Findings showed multiple bone lesions involving posterior T9 and T8,  lumbar spine and upper sacrum consistent with multiple myeloma. No extension into the canal. No pathologic fracture.  --continue follow up with Dr .Arley Helling, neurosugery --Continue to monitor  #Supportive Care --chemotherapy education complete  --port placement complete on 09/09/2023. EMLA  cream sent today.  --zofran  8mg  q8H PRN and compazine  10mg  PO q6H for nausea -- acyclovir  400mg  PO BID for VCZ prophylaxis --2 year of zometa  therapy completed in July 2024.  -- no prescription pain medication required at this time.   No orders of the defined types were placed in this encounter.  All questions were answered. The patient knows to call the clinic with any problems, questions or concerns.  I have spent a total of 30 minutes minutes of face-to-face and non-face-to-face time, preparing to see the patient, performing a medically appropriate examination, counseling and educating the patient, documenting clinical  information in the electronic health record.   Doris IVAR Kidney, MD Department of Hematology/Oncology [facility] Phone: [phone] Pager: [phone] Email: [email or web address]  09/29/2024 9:09 AM

## 2024-09-29 ENCOUNTER — Encounter: Payer: Self-pay | Admitting: Hematology and Oncology

## 2024-09-30 ENCOUNTER — Inpatient Hospital Stay

## 2024-09-30 ENCOUNTER — Inpatient Hospital Stay: Admitting: Hematology and Oncology

## 2024-10-04 ENCOUNTER — Encounter: Payer: Self-pay | Admitting: Hematology and Oncology

## 2024-10-10 ENCOUNTER — Ambulatory Visit (HOSPITAL_BASED_OUTPATIENT_CLINIC_OR_DEPARTMENT_OTHER)
Admission: RE | Admit: 2024-10-10 | Discharge: 2024-10-10 | Disposition: A | Discharge status: Home / Self Care | Source: Ambulatory Visit | Attending: Family Medicine | Admitting: Family Medicine

## 2024-10-10 ENCOUNTER — Ambulatory Visit: Admitting: Family Medicine

## 2024-10-10 ENCOUNTER — Ambulatory Visit: Payer: Self-pay | Admitting: Family Medicine

## 2024-10-10 VITALS — BP 150/84 | HR 71 | Temp 98.9°F | Resp 14 | Ht 65.0 in | Wt 130.2 lb

## 2024-10-10 DIAGNOSIS — I6523 Occlusion and stenosis of bilateral carotid arteries: Secondary | ICD-10-CM | POA: Diagnosis not present

## 2024-10-10 DIAGNOSIS — G44309 Post-traumatic headache, unspecified, not intractable: Secondary | ICD-10-CM | POA: Insufficient documentation

## 2024-10-10 DIAGNOSIS — R519 Headache, unspecified: Secondary | ICD-10-CM | POA: Insufficient documentation

## 2024-10-10 DIAGNOSIS — I10 Essential (primary) hypertension: Secondary | ICD-10-CM | POA: Diagnosis not present

## 2024-10-10 NOTE — Patient Instructions (Addendum)
 Thank you for coming in today.  Keep an eye on your blood pressure readings but lets just stay on 2.5 mg of amlodipine  for now.  CT head at the [facility] location below at 445 today and I will let you know if any concerns on that imaging.  Can certainly discuss the headaches with your other specialists but should have more info once we review the CT scan results and see how the blood pressure treatment affects the headaches as well.  I will see you in the next few days, but let me know if there are questions in the meantime!   [facility] Address: [address] [place], [place] [address] Phone: [phone]

## 2024-10-10 NOTE — Progress Notes (Signed)
 Subjective:  Patient ID: Doris Wilkerson, female    DOB: 1946-04-29  Age: 78 y.o. MRN: [id number]  CC:  Chief Complaint  Patient presents with   Headache    Sx started 2 weeks ago. Comes and goes. Around the left eye and left front side of her head. Had an episode of high blood pressure when she saw Dr. Matilde.Dr. Ladona took her off amlodipine  2.5mg  . She took her amlodipine  today and nitroglycerin  just in case.    HPI Doris Wilkerson presents for   Headache Off-and-on for the past month. More persistent and worse past 2 weeks. Located behind left eye and left temple.  Fall on October 9th after tripping on cord, hit left temple on ground. no LOC. Called EMS, appeared to be ok. No ER eval. Had bruising around the eye.  No vision changes with current HA. No darkening, no jaw pain.  No n/v.  No focal weakness.  No slurred speech, no facial droop.  No Car-T infusion June 23rd.   Tx: tylenol  - minimal relief initially. Less relief over time.  On ASA.   History of hypertension, last discussed in September.  Previously had required ACE inhibitor, higher dose of amlodipine  but then had some lower readings and lightheadedness, medications adjusted with hold of ACE inhibitor and decreased amlodipine  to 2.5 mg in early August.  She did report headache a few times at time of prep for port, treatment for multiple myeloma, bone marrow biopsy.  Some stretch prior to the bone marrow biopsy with blood pressure 160/90.  Blood pressure 110/72 at her September 17 visit.  Previous elevated readings seem to be situational.  She did have a follow-up with her cardiologist on October 7 and at that time amlodipine  was discontinued, with option to restart amlodipine  if systolic above 130. Visit on November 5 noted with Dr. Federico, blood pressure 156/91.  Restarted amlodipine  today.  Took 1/3 tab nitroglycerin  early this am. BP 170-180/90 at that time. Possible slight improvement in HA.   BP Readings from  Last 3 Encounters:  10/10/24 (!) 150/84  09/28/24 (!) 156/91  08/30/24 116/62     History Patient Active Problem List   Diagnosis Date Noted   Abnormal finding of blood chemistry, unspecified 03/31/2024   Port-A-Cath in place 09/11/2023   Mixed hyperlipidemia 01/11/2023   Essential hypertension 01/11/2023   Physical debility 03/21/2021   Dizziness 03/21/2021   Vertigo 03/21/2021   Anemia due to antineoplastic chemotherapy 03/21/2021   Skin changes related to chemotherapy 03/21/2021   Other constipation 03/21/2021   Multiple myeloma not having achieved remission (HCC) 01/27/2021   Tear of medial meniscus of knee 10/12/2019   Pain in right knee 09/30/2019   Hordeolum externum of left lower eyelid 06/28/2018   CAD (coronary artery disease) 12/09/2011   Dyslipidemia 12/09/2011   Osteopenia 12/09/2011   Hypothyroid 12/09/2011   Asthma 12/09/2011   Past Medical History:  Diagnosis Date   Asthma    Cataract    GERD (gastroesophageal reflux disease)    Hyperlipidemia    Thyroid  disease    Past Surgical History:  Procedure Laterality Date   Cataract Surgery     CORONARY ANGIOPLASTY WITH STENT PLACEMENT     EYE SURGERY     IR IMAGING GUIDED PORT INSERTION  09/09/2023   ORTHOPEDIC SURGERY  2012   TONSILLECTOMY  1951   Allergies  Allergen Reactions   Poison Ivy Extract Swelling   Plavix [Clopidogrel Bisulfate]    Wound  Dressing Adhesive    Other     seasonal   Prior to Admission medications   Medication Sig Start Date End Date Taking? Authorizing Provider  acyclovir  (ZOVIRAX ) 200 MG capsule Take 2 capsules (400 mg total) by mouth 2 (two) times daily. 05/26/24  Yes Thayil, Irene T, PA-C  aspirin 81 MG tablet Take 81 mg by mouth daily.    Yes Burney Darice CROME, MD  atorvastatin  (LIPITOR) 80 MG tablet TAKE 1 TABLET BY MOUTH DAILY AT  6 PM 08/31/24  Yes Levora Reyes SAUNDERS, MD  cholecalciferol (VITAMIN D3) 25 MCG (1000 UNIT) tablet Take 2,000 Units by mouth daily.   Yes  [provider]  famotidine (PEPCID) 20 MG tablet Take 20 mg by mouth daily.   Yes [provider]  gabapentin  (NEURONTIN ) 100 MG capsule TAKE 1 CAPSULE BY MOUTH TWICE  DAILY AS NEEDED 09/08/24  Yes Levora Reyes SAUNDERS, MD  levothyroxine  (SYNTHROID ) 25 MCG tablet TAKE 1 TABLET BY MOUTH DAILY  BEFORE BREAKFAST 08/23/24  Yes Levora Reyes SAUNDERS, MD  lidocaine -prilocaine  (EMLA ) cream Apply 1 Application topically as needed. 09/11/23  Yes Thayil, Irene T, PA-C  Magnesium 100 MG TABS Take 100 mg by mouth daily.   Yes [provider]  melatonin 1 MG TABS tablet Take 1 mg by mouth at bedtime.   Yes [provider]  nitroGLYCERIN  (NITROSTAT ) 0.4 MG SL tablet Place 1 tablet (0.4 mg total) under the tongue every 5 (five) minutes as needed for chest pain. 07/13/23 10/10/24 Yes Ladona Heinz, MD  polyethylene glycol (MIRALAX / GLYCOLAX) 17 g packet Take 17 g by mouth daily as needed.   Yes [provider]  SF 5000 PLUS 1.1 % CREA dental cream Take 1 Application by mouth. 04/06/24  Yes [provider]  sulfamethoxazole -trimethoprim  (BACTRIM  DS) 800-160 MG tablet Take 1 tablet by mouth. Every Monday, Wednesday and Friday 06/13/24  Yes [provider]   Social History   Socioeconomic History   Marital status: Married    Spouse name: Not on file   Number of children: 0   Years of education: Not on file   Highest education level: Master's degree (e.g., MA, MS, MEng, MEd, MSW, MBA)  Occupational History   Occupation: unemployed  Tobacco Use   Smoking status: Never   Smokeless tobacco: Never  Vaping Use   Vaping status: Never Used  Substance and Sexual Activity   Alcohol use: Yes    Alcohol/week: 1.0 standard drink of alcohol    Types: 1 Glasses of wine per week    Comment: occassionally   Drug use: No   Sexual activity: Not Currently  Other Topics Concern   Not on file  Social History Narrative   Married. Education: college. Pt does exercise-    Social Drivers of Health   Financial Resource Strain: Low Risk  (10/10/2024)   Overall Financial Resource Strain (CARDIA)    Difficulty of Paying Living Expenses: Not hard at all  Food Insecurity: No Food Insecurity (10/10/2024)   Hunger Vital Sign    Worried About Running Out of Food in the Last Year: Never true    Ran Out of Food in the Last Year: Never true  Transportation Needs: No Transportation Needs (10/10/2024)   PRAPARE - Administrator, Civil Service (Medical): No    Lack of Transportation (Non-Medical): No  Physical Activity: Insufficiently Active (10/10/2024)   Exercise Vital Sign    Days of Exercise per Week: 4 days  Minutes of Exercise per Session: 20 min  Stress: No Stress Concern Present (10/10/2024)   [employer] of Occupational Health - Occupational Stress Questionnaire    Feeling of Stress: Only a little  Social Connections: Moderately Integrated (10/10/2024)   Social Connection and Isolation Panel    Frequency of Communication with Friends and Family: More than three times a week    Frequency of Social Gatherings with Friends and Family: Once a week    Attends Religious Services: Never    Database Administrator or Organizations: Yes    Attends Engineer, Structural: More than 4 times per year    Marital Status: Married  Catering Manager Violence: Not At Risk (05/31/2024)   Humiliation, Afraid, Rape, and Kick questionnaire    Fear of Current or Ex-Partner: No    Emotionally Abused: No    Physically Abused: No    Sexually Abused: No    Review of Systems   Objective:   Vitals:   10/10/24 1415  BP: (!) 150/84  Pulse: 71  Resp: 14  Temp: 98.9 F (37.2 C)  TempSrc: Temporal  SpO2: 99%  Weight: 130 lb 3.2 oz (59.1 kg)  Height: 5' 5 (1.651 m)     Physical Exam Vitals reviewed.  Constitutional:      Appearance: Normal appearance. She is well-developed.  HENT:     Head: Normocephalic and atraumatic.  Eyes:      Conjunctiva/sclera: Conjunctivae normal.     Pupils: Pupils are equal, round, and reactive to light.  Neck:     Vascular: No carotid bruit.  Cardiovascular:     Rate and Rhythm: Normal rate and regular rhythm.     Heart sounds: Normal heart sounds.  Pulmonary:     Effort: Pulmonary effort is normal.     Breath sounds: Normal breath sounds.  Abdominal:     Palpations: Abdomen is soft. There is no pulsatile mass.     Tenderness: There is no abdominal tenderness.  Musculoskeletal:     Right lower leg: No edema.     Left lower leg: No edema.  Skin:    General: Skin is warm and dry.  Neurological:     General: No focal deficit present.     Mental Status: She is alert and oriented to person, place, and time.     GCS: GCS eye subscore is 4. GCS verbal subscore is 5. GCS motor subscore is 6.     Cranial Nerves: No cranial nerve deficit, dysarthria or facial asymmetry.     Sensory: No sensory deficit.     Motor: Motor function is intact. No weakness or pronator drift.     Coordination: Romberg sign negative. Coordination normal. Finger-Nose-Finger Test and Heel to [facility] Test normal. Rapid alternating movements normal.     Gait: Gait is intact.     Comments: Nonfocal exam.  PERRL, EOMI, no nystagmus.  No diplopia.  No appreciable discomfort with percussion of frontal/maxillary sinus or cords palpated at left temple.  Does report area of discomfort at the left frontal sinus, retro-orbital.  Psychiatric:        Mood and Affect: Mood normal.        Behavior: Behavior normal.        Assessment & Plan:  Doris Wilkerson is a 78 y.o. female . New onset headache - Plan: CT HEAD WO CONTRAST ( )  Post-traumatic headache, not intractable, unspecified chronicity pattern - Plan: CT HEAD WO CONTRAST ( )  Essential hypertension -  Plan: CT HEAD WO CONTRAST ( )  Given the headaches, CT head was obtained without acute or concerning findings.  Home monitoring of blood pressure with options of  medication adjustment, which could contribute to headache.  Previous Car T treatment.  Can discuss headaches with specialist to see if that is seen with that previous treatment.  Close monitoring of symptoms, update through MyChart and then can decide on further testing or treatment options.  Close follow-up recommended, with ER/RTC precautions given.  No orders of the defined types were placed in this encounter.  Patient Instructions  Thank you for coming in today.  Keep an eye on your blood pressure readings but lets just stay on 2.5 mg of amlodipine  for now.  CT head at the [facility] location below at 445 today and I will let you know if any concerns on that imaging.  Can certainly discuss the headaches with your other specialists but should have more info once we review the CT scan results and see how the blood pressure treatment affects the headaches as well.  I will see you in the next few days, but let me know if there are questions in the meantime!   [facility] Address: [address] [place], [place] [address] Phone: [phone]     Signed,   Reyes Pines, MD [facility], [facility] 10/10/24 8:27 PM

## 2024-10-11 NOTE — Telephone Encounter (Signed)
Patient responded.

## 2024-10-16 ENCOUNTER — Encounter: Payer: Self-pay | Admitting: Family Medicine

## 2024-10-17 ENCOUNTER — Encounter: Payer: Self-pay | Admitting: Family Medicine

## 2024-10-17 NOTE — Telephone Encounter (Signed)
 Patient is following up with BP numbers. FYI

## 2024-10-24 DIAGNOSIS — S76312A Strain of muscle, fascia and tendon of the posterior muscle group at thigh level, left thigh, initial encounter: Secondary | ICD-10-CM | POA: Diagnosis not present

## 2024-11-22 ENCOUNTER — Other Ambulatory Visit (HOSPITAL_COMMUNITY): Payer: Self-pay

## 2024-11-23 ENCOUNTER — Other Ambulatory Visit (HOSPITAL_COMMUNITY): Payer: Self-pay

## 2024-11-25 ENCOUNTER — Other Ambulatory Visit (HOSPITAL_COMMUNITY): Payer: Self-pay

## 2024-11-28 ENCOUNTER — Encounter: Payer: Self-pay | Admitting: Family Medicine

## 2024-11-28 ENCOUNTER — Other Ambulatory Visit (HOSPITAL_COMMUNITY): Payer: Self-pay

## 2024-11-28 DIAGNOSIS — I959 Hypotension, unspecified: Secondary | ICD-10-CM

## 2024-11-28 NOTE — Telephone Encounter (Signed)
 Please advise

## 2024-11-29 ENCOUNTER — Other Ambulatory Visit (HOSPITAL_COMMUNITY): Payer: Self-pay

## 2024-11-29 MED ORDER — AMLODIPINE BESYLATE 2.5 MG PO TABS
2.5000 mg | ORAL_TABLET | Freq: Every day | ORAL | 1 refills | Status: AC
  Filled 2024-11-29 – 2024-12-09 (×2): qty 90, 90d supply, fill #0

## 2024-12-08 ENCOUNTER — Other Ambulatory Visit (HOSPITAL_COMMUNITY): Payer: Self-pay

## 2024-12-10 ENCOUNTER — Other Ambulatory Visit (HOSPITAL_COMMUNITY): Payer: Self-pay

## 2024-12-15 ENCOUNTER — Other Ambulatory Visit (HOSPITAL_COMMUNITY): Payer: Self-pay

## 2024-12-20 ENCOUNTER — Encounter: Payer: Self-pay | Admitting: Hematology and Oncology

## 2024-12-23 ENCOUNTER — Telehealth: Payer: Self-pay | Admitting: Hematology and Oncology

## 2024-12-23 NOTE — Telephone Encounter (Signed)
 Pt called to reschedule 2/2 appt

## 2024-12-26 ENCOUNTER — Inpatient Hospital Stay

## 2024-12-26 ENCOUNTER — Inpatient Hospital Stay: Admitting: Hematology and Oncology

## 2024-12-28 NOTE — Progress Notes (Unsigned)
 " [facility] [phone]   [phone]  PROGRESS NOTE  Patient Care Team: Levora Reyes SAUNDERS, MD as PCP - General (Family Medicine) Ladona Heinz, MD as PCP - Cardiology (Cardiology) Ladona Heinz, MD as Consulting Physician (Cardiology) Debarah Lorrene DEL., MD (Ophthalmology)  Hematological/Oncological History # Doris Wilkerson Multiple Myeloma 12/26/2020: MRI of pelvis showed lytic lesions on L4, L5, the sacrum, with pathologic fracture of left inferior pubic ramus. Concerning for multiple myeloma vs metastatic disease 12/31/2020: establish care with Dr. Federico. UPEP showed marked Bence Jones protein, findings concerning for multiple myleoma 01/21/2021: bone marrow biopsy confirms multiple myeloma with atypical plasma cells representing 28% of all cells in the aspirate  02/01/2021: Cycle 1 Day 1 of VRd chemotherapy 02/22/2021: Cycle 2 Day 1 of VRd chemotherapy 03/15/2021: Cycle 3 Day 1 of VRd chemotherapy 04/05/2021: Cycle 4 Day 1 of VRd chemotherapy 04/26/2021: Cycle 5 Day 1 of VRd chemotherapy 05/17/2021: Cycle 6 Day 1 of VRd chemotherapy 06/07/2021:  Cycle 7 Day 1 of VRd chemotherapy 06/28/2021: Cycle 8 Day 1 of VRd chemotherapy 07/19/2021: start maintenance revlimid  10mg  PO daily.  03/26/2022: Cycle 1 Day 1 of Dara/Pom/Dex 04/24/2023: Cycle 2 Day 1 of Dara/Pom/Dex 05/22/2023: Cycle 3 Day 1 of Dara/Pom/Dex 06/19/2023: Cycle 4 Day 1 of Dara/Pom/Dex 07/17/2023: Cycle 5 Day 1 of Dara/Pom/Dex 08/28/2023: d/c Dara/Pom/Dex due to progression.  09/11/2023: Cycle 1 Day 1 of Kyprolis /Dex 10/08/2023: Cycle 2 Day 1 of Kyprolis /Dex 11/06/2023: Cycle 3 Day 1 of Kyprolis /Dex 12/04/2023: Cycle 4 Day 1 of Kyprolis /Dex 01/01/2024: Cycle 5 Day 1 of Kyprolis /Dex 01/29/2024: Cycle 6 Day 1 of Kyprolis /Dex 02/26/2024: Cycle 7 Day 1 of Kyprolis /Dex 05/16/2024: Carvykti Treatment after [place]  [facility].   Interval History:  Doris Wilkerson 79 y.o. female with medical history significant for  lambda light Wilkerson multiple myeloma who presents for a follow up visit. She was last seen on 09/28/2024. In the interim she has ***   On exam today Doris Wilkerson is accompanied by her husband.  She reports ***   MEDICAL HISTORY:  Past Medical History:  Diagnosis Date   Asthma    Cataract    GERD (gastroesophageal reflux disease)    Hyperlipidemia    Thyroid  disease     SURGICAL HISTORY: Past Surgical History:  Procedure Laterality Date   Cataract Surgery     CORONARY ANGIOPLASTY WITH STENT PLACEMENT     EYE SURGERY     IR IMAGING GUIDED PORT INSERTION  09/09/2023   ORTHOPEDIC SURGERY  2012   TONSILLECTOMY  1951    SOCIAL HISTORY: Social History   Socioeconomic History   Marital status: Married    Spouse name: Not on file   Number of children: 0   Years of education: Not on file   Highest education level: Master's degree (e.g., MA, MS, MEng, MEd, MSW, MBA)  Occupational History   Occupation: unemployed  Tobacco Use   Smoking status: Never   Smokeless tobacco: Never  Vaping Use   Vaping status: Never Used  Substance and Sexual Activity   Alcohol use: Yes    Alcohol/week: 1.0 standard drink of alcohol    Types: 1 Glasses of wine per week    Comment: occassionally   Drug use: No   Sexual activity: Not Currently  Other Topics Concern   Not on file  Social History Narrative   Married. Education: college. Pt does exercise-   Social Drivers of Health   Tobacco Use: Low Risk (11/14/2024)   Received from  [facility]   Patient History    Smoking Tobacco Use: Never    Smokeless Tobacco Use: Never    Passive Exposure: Not on file  Financial Resource Strain: Low Risk (10/10/2024)   Overall Financial Resource Strain (CARDIA)    Difficulty of Paying Living Expenses: Not hard at all  Food Insecurity: No Food Insecurity (10/10/2024)   Epic    Worried About Programme Researcher, Broadcasting/film/video in the Last Year: Never true    Ran Out of Food in the Last Year: Never true  Transportation  Needs: No Transportation Needs (10/10/2024)   Epic    Lack of Transportation (Medical): No    Lack of Transportation (Non-Medical): No  Physical Activity: Insufficiently Active (10/10/2024)   Exercise Vital Sign    Days of Exercise per Week: 4 days    Minutes of Exercise per Session: 20 min  Stress: No Stress Concern Present (10/10/2024)   [employer] of Occupational Health - Occupational Stress Questionnaire    Feeling of Stress: Only a little  Social Connections: Moderately Integrated (10/10/2024)   Social Connection and Isolation Panel    Frequency of Communication with Friends and Family: More than three times a week    Frequency of Social Gatherings with Friends and Family: Once a week    Attends Religious Services: Never    Database Administrator or Organizations: Yes    Attends Engineer, Structural: More than 4 times per year    Marital Status: Married  Catering Manager Violence: Not At Risk (05/31/2024)   Epic    Fear of Current or Ex-Partner: No    Emotionally Abused: No    Physically Abused: No    Sexually Abused: No  Depression (PHQ2-9): Low Risk (09/28/2024)   Depression (PHQ2-9)    PHQ-2 Score: 0  Alcohol Screen: Low Risk (01/04/2024)   Alcohol Screen    Last Alcohol Screening Score (AUDIT): 1  Housing: Unknown (10/10/2024)   Epic    Unable to Pay for Housing in the Last Year: No    Number of Times Moved in the Last Year: Not on file    Homeless in the Last Year: No  Utilities: Not At Risk (05/31/2024)   Epic    Threatened with loss of utilities: No  Health Literacy: Low Risk (02/22/2024)   Received from [facility]    How often do you need to have someone help you when you read instructions, pamphlets, or other written material from your doctor or pharmacy?: Never    FAMILY HISTORY: Family History  Problem Relation Age of Onset   Cancer Father    Heart disease Brother    Dementia Brother    Leukemia Brother    Breast  cancer Neg Hx    Colon cancer Neg Hx    Esophageal cancer Neg Hx    Pancreatic cancer Neg Hx    Rectal cancer Neg Hx    Stomach cancer Neg Hx     ALLERGIES:  is allergic to poison ivy extract, plavix [clopidogrel bisulfate], wound dressing adhesive, and other.  MEDICATIONS:  Current Outpatient Medications  Medication Sig Dispense Refill   acyclovir  (ZOVIRAX ) 200 MG capsule Take 2 capsules (400 mg total) by mouth 2 (two) times daily. 60 capsule 3   amLODipine  (NORVASC ) 2.5 MG tablet Take 1 tablet (2.5 mg total) by mouth daily. 90 tablet 1   aspirin 81 MG tablet Take 81 mg by mouth daily.  atorvastatin  (LIPITOR) 80 MG tablet TAKE 1 TABLET BY MOUTH DAILY AT  6 PM 90 tablet 3   cholecalciferol (VITAMIN D3) 25 MCG (1000 UNIT) tablet Take 2,000 Units by mouth daily.     famotidine (PEPCID) 20 MG tablet Take 20 mg by mouth daily.     gabapentin  (NEURONTIN ) 100 MG capsule TAKE 1 CAPSULE BY MOUTH TWICE  DAILY AS NEEDED 180 capsule 3   levothyroxine  (SYNTHROID ) 25 MCG tablet TAKE 1 TABLET BY MOUTH DAILY  BEFORE BREAKFAST 90 tablet 3   lidocaine -prilocaine  (EMLA ) cream Apply 1 Application topically as needed. 30 g 0   Magnesium 100 MG TABS Take 100 mg by mouth daily.     melatonin 1 MG TABS tablet Take 1 mg by mouth at bedtime.     nitroGLYCERIN  (NITROSTAT ) 0.4 MG SL tablet Place 1 tablet (0.4 mg total) under the tongue every 5 (five) minutes as needed for chest pain. 25 tablet 1   polyethylene glycol (MIRALAX / GLYCOLAX) 17 g packet Take 17 g by mouth daily as needed.     SF 5000 PLUS 1.1 % CREA dental cream Take 1 Application by mouth.     sulfamethoxazole -trimethoprim  (BACTRIM  DS) 800-160 MG tablet Take 1 tablet by mouth. Every Monday, Wednesday and Friday     No current facility-administered medications for this visit.    REVIEW OF SYSTEMS:   Constitutional: ( - ) fevers, ( - )  chills , ( - ) night sweats Eyes: ( - ) blurriness of vision, ( - ) double vision, ( - ) watery eyes Ears,  nose, mouth, throat, and face: ( - ) mucositis, ( - ) sore throat Respiratory: ( - ) cough, ( - ) dyspnea, ( - ) wheezes Cardiovascular: ( - ) palpitation, ( - ) chest discomfort, ( - ) lower extremity swelling Gastrointestinal:  ( - ) nausea, ( - ) heartburn, ( - ) change in bowel habits Skin: ( - ) abnormal skin rashes Lymphatics: ( - ) new lymphadenopathy, ( - ) easy bruising Neurological: ( +) numbness, ( - ) tingling, ( - ) new weaknesses Behavioral/Psych: ( - ) mood change, ( - ) new changes  All other systems were reviewed with the patient and are negative.  PHYSICAL EXAMINATION: ECOG PERFORMANCE STATUS: 1 - Symptomatic but completely ambulatory  There were no vitals filed for this visit.    There were no vitals filed for this visit.    GENERAL: well appearing elderly Caucasian female in NAD  SKIN: skin color, texture, turgor are normal, no rashes or significant lesions EYES: conjunctiva are pink and non-injected, sclera clear LUNGS: clear to auscultation and percussion with normal breathing effort HEART: regular rate & rhythm and no murmurs and trace lower extremity edema Musculoskeletal: no cyanosis of digits and no clubbing  PSYCH: alert & oriented x 3, fluent speech NEURO: no focal motor/sensory deficits  LABORATORY DATA:  I have reviewed the data as listed    Latest Ref Rng & Units 09/28/2024    7:38 AM 06/30/2024    9:17 AM 03/11/2024    8:05 AM  CBC  WBC 4.0 - 10.5 K/uL 3.4  6.1  4.7   Hemoglobin 12.0 - 15.0 g/dL 88.0  87.6  89.1   Hematocrit 36.0 - 46.0 % 36.8  37.9  32.3   Platelets 150 - 400 K/uL 188  272.0  164        Latest Ref Rng & Units 09/28/2024    7:38 AM 06/30/2024  9:17 AM 03/11/2024    8:05 AM  CMP  Glucose 70 - 99 mg/dL 895  92  83   BUN 8 - 23 mg/dL 19  13  17    Creatinine 0.44 - 1.00 mg/dL 9.25  9.28  9.36   Sodium 135 - 145 mmol/L 141  141  139   Potassium 3.5 - 5.1 mmol/L 4.2  4.1  4.1   Chloride 98 - 111 mmol/L 108  105  109   CO2 22  - 32 mmol/L 27  26  25    Calcium  8.9 - 10.3 mg/dL 9.9  89.7  9.2   Total Protein 6.5 - 8.1 g/dL 6.0  6.0  5.8   Total Bilirubin 0.0 - 1.2 mg/dL 0.6  0.7  0.9   Alkaline Phos 38 - 126 U/L 61  44  50   AST 15 - 41 U/L 22  35  19   ALT 0 - 44 U/L 20  26  19      Lab Results  Component Value Date   MPROTEIN Not Observed 03/11/2024   MPROTEIN Not Observed 02/26/2024   MPROTEIN Not Observed 02/12/2024   Lab Results  Component Value Date   KPAFRELGTCHN 3.9 03/11/2024   KPAFRELGTCHN 4.5 02/26/2024   KPAFRELGTCHN 0.8 (L) 02/12/2024   LAMBDASER 155.5 (H) 03/11/2024   LAMBDASER 113.3 (H) 02/26/2024   LAMBDASER 120.4 (H) 02/12/2024   KAPLAMBRATIO 0.03 (L) 03/11/2024   KAPLAMBRATIO 0.04 (L) 02/26/2024   KAPLAMBRATIO 0.01 (L) 02/12/2024    RADIOGRAPHIC STUDIES: No results found.    ASSESSMENT & PLAN Doris Wilkerson is a 79 y.o. female with medical history significant for lambda light Wilkerson multiple myeloma who presents for a follow up visit. Ms. Santino is not a candidate for bone marrow transplant based on her advanced age.    # Lambda Light Wilkerson Multiple Myeloma--VGPR on maintenance --diagnosis confirmed with bone marrow biopsy and urine protein analysis.   --patient has good functional status and would be an excellent candidate for VRd chemotherapy. I do not believe she would be a bone marrow transplant candidate based on her age.  --Received VRD chemotherapy from 02/01/2021-07/12/2021 then started maintenance Revlimid  on 07/19/2021.  --Due to rising lambda light Wilkerson, recommended to switch to Daratumumab /Pomalyst /Dexamethasone . --Received Dara/Pom/Dex from 03/27/2023-08/28/2023. --Due to rising lambda light Wilkerson, recommend to switch to Kyprolis /Dex 3 weeks on 1 week off, starting today. Plan: --patient received Carvykti therapy at [facility] on 05/16/2024 --Labs today show white blood cell count ***. Creatinine slighly above normal at *** --Plan to obtain myeloma labs q 6 weeks with port  flush.  --Patient following with Dr. Odella at [facility], vaccines to be administered there. Going to visit q 3 months.  -- port flush q 6 weeks  --Return to clinic in 3 month with interval labs/port flush q 6 weeks.   #Back Pain/Left Hip Pain  -- Currently well controlled on gabapentin  and Tylenol  --CT thoracic and lumbar spine showed no lytic lesions or sequelae of multiple myeloma --MRI thoracic and lumbar spine from 07/07/2022 for baseline imaging. Findings showed multiple bone lesions involving posterior T9 and T8, lumbar spine and upper sacrum consistent with multiple myeloma. No extension into the canal. No pathologic fracture.  --continue follow up with Dr .Arley Helling, neurosugery --Continue to monitor  #Supportive Care --chemotherapy education complete  --port placement complete on 09/09/2023. EMLA  cream sent today.  --zofran  8mg  q8H PRN and compazine  10mg  PO q6H for nausea -- acyclovir  400mg  PO BID for VCZ prophylaxis --  2 year of zometa  therapy completed in July 2024.  -- no prescription pain medication required at this time.   No orders of the defined types were placed in this encounter.  All questions were answered. The patient knows to call the clinic with any problems, questions or concerns.  I have spent a total of 30 minutes minutes of face-to-face and non-face-to-face time, preparing to see the patient, performing a medically appropriate examination, counseling and educating the patient, documenting clinical information in the electronic health record.   Norleen IVAR Kidney, MD Department of Hematology/Oncology [facility] at [facility] Phone: [phone] Pager: [phone] Email: [email or web address]  12/28/2024 7:58 PM  "

## 2024-12-29 ENCOUNTER — Inpatient Hospital Stay: Attending: Hematology and Oncology | Admitting: Hematology and Oncology

## 2024-12-29 ENCOUNTER — Inpatient Hospital Stay: Attending: Hematology and Oncology

## 2024-12-29 VITALS — BP 141/81 | HR 88 | Temp 97.5°F | Resp 16 | Wt 135.4 lb

## 2024-12-29 DIAGNOSIS — Z08 Encounter for follow-up examination after completed treatment for malignant neoplasm: Secondary | ICD-10-CM

## 2024-12-29 DIAGNOSIS — C9001 Multiple myeloma in remission: Secondary | ICD-10-CM

## 2024-12-29 DIAGNOSIS — Z95828 Presence of other vascular implants and grafts: Secondary | ICD-10-CM

## 2024-12-29 DIAGNOSIS — C9000 Multiple myeloma not having achieved remission: Secondary | ICD-10-CM

## 2024-12-29 LAB — CBC WITH DIFFERENTIAL (CANCER CENTER ONLY)
Abs Immature Granulocytes: 0.01 10*3/uL (ref 0.00–0.07)
Basophils Absolute: 0.1 10*3/uL (ref 0.0–0.1)
Basophils Relative: 1 %
Eosinophils Absolute: 0.1 10*3/uL (ref 0.0–0.5)
Eosinophils Relative: 2 %
HCT: 40.1 % (ref 36.0–46.0)
Hemoglobin: 13.3 g/dL (ref 12.0–15.0)
Immature Granulocytes: 0 %
Lymphocytes Relative: 18 %
Lymphs Abs: 0.8 10*3/uL (ref 0.7–4.0)
MCH: 29.2 pg (ref 26.0–34.0)
MCHC: 33.2 g/dL (ref 30.0–36.0)
MCV: 88.1 fL (ref 80.0–100.0)
Monocytes Absolute: 0.7 10*3/uL (ref 0.1–1.0)
Monocytes Relative: 14 %
Neutro Abs: 2.9 10*3/uL (ref 1.7–7.7)
Neutrophils Relative %: 65 %
Platelet Count: 208 10*3/uL (ref 150–400)
RBC: 4.55 MIL/uL (ref 3.87–5.11)
RDW: 14.8 % (ref 11.5–15.5)
WBC Count: 4.5 10*3/uL (ref 4.0–10.5)
nRBC: 0 % (ref 0.0–0.2)

## 2024-12-29 LAB — CMP (CANCER CENTER ONLY)
ALT: 25 U/L (ref 0–44)
AST: 26 U/L (ref 15–41)
Albumin: 4.7 g/dL (ref 3.5–5.0)
Alkaline Phosphatase: 74 U/L (ref 38–126)
Anion gap: 9 (ref 5–15)
BUN: 17 mg/dL (ref 8–23)
CO2: 26 mmol/L (ref 22–32)
Calcium: 9.9 mg/dL (ref 8.9–10.3)
Chloride: 103 mmol/L (ref 98–111)
Creatinine: 0.73 mg/dL (ref 0.44–1.00)
GFR, Estimated: >60 mL/min
Glucose, Bld: 92 mg/dL (ref 70–99)
Potassium: 4.3 mmol/L (ref 3.5–5.1)
Sodium: 138 mmol/L (ref 135–145)
Total Bilirubin: 0.7 mg/dL (ref 0.0–1.2)
Total Protein: 6.4 g/dL — ABNORMAL LOW (ref 6.5–8.1)

## 2024-12-29 LAB — LACTATE DEHYDROGENASE: LDH: 165 U/L (ref 105–235)

## 2024-12-30 LAB — KAPPA/LAMBDA LIGHT CHAINS
Kappa free light chain: 6.1 mg/L (ref 3.3–19.4)
Kappa, lambda light chain ratio: 1.53 (ref 0.26–1.65)
Lambda free light chains: 4 mg/L — ABNORMAL LOW (ref 5.7–26.3)

## 2025-01-16 ENCOUNTER — Ambulatory Visit: Admitting: Family Medicine

## 2025-03-20 ENCOUNTER — Inpatient Hospital Stay: Attending: Hematology and Oncology

## 2025-03-20 ENCOUNTER — Inpatient Hospital Stay: Admitting: Hematology and Oncology
